# Patient Record
Sex: Female | Born: 1970
Health system: Southern US, Community
[De-identification: ages and names within clinical notes are randomized; demographics above are authoritative.]

## PROBLEM LIST (undated history)

## (undated) DIAGNOSIS — M1990 Unspecified osteoarthritis, unspecified site: Secondary | ICD-10-CM

## (undated) DIAGNOSIS — F32A Depression, unspecified: Secondary | ICD-10-CM

## (undated) DIAGNOSIS — E7800 Pure hypercholesterolemia, unspecified: Secondary | ICD-10-CM

## (undated) DIAGNOSIS — R072 Precordial pain: Secondary | ICD-10-CM

## (undated) DIAGNOSIS — K589 Irritable bowel syndrome without diarrhea: Secondary | ICD-10-CM

## (undated) DIAGNOSIS — H409 Unspecified glaucoma: Secondary | ICD-10-CM

## (undated) DIAGNOSIS — D72821 Monocytosis (symptomatic): Secondary | ICD-10-CM

## (undated) DIAGNOSIS — M549 Dorsalgia, unspecified: Secondary | ICD-10-CM

## (undated) DIAGNOSIS — F329 Major depressive disorder, single episode, unspecified: Secondary | ICD-10-CM

## (undated) DIAGNOSIS — D759 Disease of blood and blood-forming organs, unspecified: Secondary | ICD-10-CM

## (undated) DIAGNOSIS — D471 Chronic myeloproliferative disease: Secondary | ICD-10-CM

## (undated) DIAGNOSIS — E559 Vitamin D deficiency, unspecified: Secondary | ICD-10-CM

## (undated) DIAGNOSIS — L299 Pruritus, unspecified: Secondary | ICD-10-CM

## (undated) DIAGNOSIS — I70208 Unspecified atherosclerosis of native arteries of extremities, other extremity: Secondary | ICD-10-CM

## (undated) DIAGNOSIS — Z8249 Family history of ischemic heart disease and other diseases of the circulatory system: Secondary | ICD-10-CM

## (undated) DIAGNOSIS — K219 Gastro-esophageal reflux disease without esophagitis: Secondary | ICD-10-CM

## (undated) DIAGNOSIS — D72829 Elevated white blood cell count, unspecified: Principal | ICD-10-CM

## (undated) DIAGNOSIS — M2550 Pain in unspecified joint: Secondary | ICD-10-CM

## (undated) DIAGNOSIS — E079 Disorder of thyroid, unspecified: Secondary | ICD-10-CM

## (undated) DIAGNOSIS — I10 Essential (primary) hypertension: Secondary | ICD-10-CM

## (undated) DIAGNOSIS — D72828 Other elevated white blood cell count: Secondary | ICD-10-CM

## (undated) DIAGNOSIS — M797 Fibromyalgia: Secondary | ICD-10-CM

## (undated) DIAGNOSIS — R600 Localized edema: Secondary | ICD-10-CM

## (undated) HISTORY — DX: Gastro-esophageal reflux disease without esophagitis: K21.9

## (undated) HISTORY — DX: Vitamin D deficiency, unspecified: E55.9

## (undated) HISTORY — DX: Other elevated white blood cell count: D72.828

## (undated) HISTORY — DX: Chronic myeloproliferative disease: D47.1

## (undated) HISTORY — DX: Pure hypercholesterolemia, unspecified: E78.00

## (undated) HISTORY — DX: Elevated white blood cell count, unspecified: D72.829

## (undated) HISTORY — DX: Unspecified atherosclerosis of native arteries of extremities, other extremity: I70.208

## (undated) HISTORY — DX: Precordial pain: R07.2

## (undated) HISTORY — DX: Pain in unspecified joint: M25.50

## (undated) HISTORY — DX: Localized edema: R60.0

## (undated) HISTORY — DX: Dorsalgia, unspecified: M54.9

## (undated) HISTORY — DX: Monocytosis (symptomatic): D72.821

## (undated) HISTORY — DX: Family history of ischemic heart disease and other diseases of the circulatory system: Z82.49

## (undated) HISTORY — DX: Disorder of thyroid, unspecified: E07.9

## (undated) HISTORY — DX: Pruritus, unspecified: L29.9

## (undated) HISTORY — DX: Fibromyalgia: M79.7

## (undated) HISTORY — DX: Irritable bowel syndrome, unspecified: K58.9

## (undated) HISTORY — DX: Depression, unspecified: F32.A

## (undated) HISTORY — PX: EYE SURGERY: SHX253

---
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
## 1997-09-02 ENCOUNTER — Emergency Department (HOSPITAL_COMMUNITY): Admission: EM | Admit: 1997-09-02 | Discharge: 1997-09-02 | Payer: Self-pay | Admitting: Emergency Medicine

## 1997-09-04 ENCOUNTER — Ambulatory Visit (HOSPITAL_COMMUNITY): Admission: RE | Admit: 1997-09-04 | Discharge: 1997-09-04 | Payer: Self-pay | Admitting: Emergency Medicine

## 1998-03-18 ENCOUNTER — Other Ambulatory Visit: Admission: RE | Admit: 1998-03-18 | Discharge: 1998-03-18 | Payer: Self-pay | Admitting: *Deleted

## 1998-03-22 HISTORY — PX: TUBAL LIGATION: SHX77

## 1998-06-17 ENCOUNTER — Encounter: Payer: Self-pay | Admitting: *Deleted

## 1998-06-18 ENCOUNTER — Ambulatory Visit (HOSPITAL_COMMUNITY): Admission: RE | Admit: 1998-06-18 | Discharge: 1998-06-18 | Payer: Self-pay | Admitting: *Deleted

## 1998-07-24 ENCOUNTER — Encounter: Payer: Self-pay | Admitting: *Deleted

## 1998-07-24 ENCOUNTER — Ambulatory Visit (HOSPITAL_COMMUNITY): Admission: RE | Admit: 1998-07-24 | Discharge: 1998-07-24 | Payer: Self-pay | Admitting: *Deleted

## 1998-10-01 ENCOUNTER — Inpatient Hospital Stay (HOSPITAL_COMMUNITY): Admission: AD | Admit: 1998-10-01 | Discharge: 1998-10-03 | Payer: Self-pay | Admitting: *Deleted

## 2007-07-17 ENCOUNTER — Other Ambulatory Visit: Admission: RE | Admit: 2007-07-17 | Discharge: 2007-07-17 | Payer: Self-pay | Admitting: Obstetrics and Gynecology

## 2007-07-27 ENCOUNTER — Encounter: Admission: RE | Admit: 2007-07-27 | Discharge: 2007-07-27 | Payer: Self-pay | Admitting: Obstetrics and Gynecology

## 2008-08-08 ENCOUNTER — Encounter: Admission: RE | Admit: 2008-08-08 | Discharge: 2008-08-08 | Payer: Self-pay | Admitting: Physician Assistant

## 2008-08-12 ENCOUNTER — Encounter: Admission: RE | Admit: 2008-08-12 | Discharge: 2008-08-12 | Payer: Self-pay | Admitting: Family Medicine

## 2009-08-10 ENCOUNTER — Ambulatory Visit (HOSPITAL_COMMUNITY): Admission: RE | Admit: 2009-08-10 | Discharge: 2009-08-10 | Payer: Self-pay | Admitting: Psychiatry

## 2009-08-15 ENCOUNTER — Other Ambulatory Visit (HOSPITAL_COMMUNITY): Admission: RE | Admit: 2009-08-15 | Discharge: 2009-08-20 | Payer: Self-pay | Admitting: Psychiatry

## 2009-08-19 ENCOUNTER — Ambulatory Visit: Payer: Self-pay | Admitting: Psychiatry

## 2009-08-19 ENCOUNTER — Emergency Department (HOSPITAL_COMMUNITY): Admission: EM | Admit: 2009-08-19 | Discharge: 2009-08-20 | Payer: Self-pay | Admitting: Emergency Medicine

## 2009-08-23 ENCOUNTER — Ambulatory Visit (HOSPITAL_COMMUNITY): Admission: RE | Admit: 2009-08-23 | Discharge: 2009-08-23 | Payer: Self-pay | Admitting: Psychiatry

## 2009-08-25 ENCOUNTER — Ambulatory Visit (HOSPITAL_COMMUNITY): Admission: RE | Admit: 2009-08-25 | Discharge: 2009-08-25 | Payer: Self-pay | Admitting: Psychiatry

## 2009-09-04 ENCOUNTER — Encounter: Admission: RE | Admit: 2009-09-04 | Discharge: 2009-09-04 | Payer: Self-pay | Admitting: Family Medicine

## 2009-10-14 ENCOUNTER — Encounter: Admission: RE | Admit: 2009-10-14 | Discharge: 2009-10-14 | Payer: Self-pay | Admitting: Family Medicine

## 2009-12-04 ENCOUNTER — Ambulatory Visit (HOSPITAL_COMMUNITY): Admission: RE | Admit: 2009-12-04 | Discharge: 2009-12-04 | Payer: Self-pay | Admitting: Cardiology

## 2009-12-16 ENCOUNTER — Other Ambulatory Visit: Admission: RE | Admit: 2009-12-16 | Discharge: 2009-12-16 | Payer: Self-pay | Admitting: Obstetrics and Gynecology

## 2010-02-26 ENCOUNTER — Inpatient Hospital Stay (HOSPITAL_COMMUNITY): Admission: AD | Admit: 2010-02-26 | Discharge: 2009-08-26 | Payer: Self-pay | Admitting: Psychiatry

## 2010-04-12 ENCOUNTER — Encounter (HOSPITAL_COMMUNITY): Payer: Self-pay | Admitting: Psychiatry

## 2010-04-13 ENCOUNTER — Encounter: Payer: Self-pay | Admitting: Family Medicine

## 2010-05-29 ENCOUNTER — Emergency Department (HOSPITAL_COMMUNITY)
Admission: EM | Admit: 2010-05-29 | Discharge: 2010-05-30 | Disposition: A | Discharge status: Other Acute Inpatient Hospital | Payer: Self-pay | Attending: Emergency Medicine | Admitting: Emergency Medicine

## 2010-05-29 DIAGNOSIS — F411 Generalized anxiety disorder: Secondary | ICD-10-CM | POA: Insufficient documentation

## 2010-05-29 DIAGNOSIS — F3289 Other specified depressive episodes: Secondary | ICD-10-CM | POA: Insufficient documentation

## 2010-05-29 DIAGNOSIS — F329 Major depressive disorder, single episode, unspecified: Secondary | ICD-10-CM | POA: Insufficient documentation

## 2010-05-29 LAB — COMPREHENSIVE METABOLIC PANEL
ALT: <8 U/L (ref 0–35)
Alkaline Phosphatase: 92 U/L (ref 39–117)
BUN: 8 mg/dL (ref 6–23)
CO2: 26 mEq/L (ref 19–32)
GFR calc non Af Amer: >60 mL/min (ref >60)
Glucose, Bld: 100 mg/dL — ABNORMAL HIGH (ref 70–99)
Potassium: 3.4 mEq/L — ABNORMAL LOW (ref 3.5–5.1)
Sodium: 138 mEq/L (ref 135–145)
Total Bilirubin: 0.7 mg/dL (ref 0.3–1.2)

## 2010-05-29 LAB — ETHANOL: Alcohol, Ethyl (B): 7 mg/dL (ref 0–10)

## 2010-05-29 LAB — CBC
HCT: 37.2 % (ref 36.0–46.0)
MCH: 28.2 pg (ref 26.0–34.0)
MCV: 90.3 fL (ref 78.0–100.0)
Platelets: 355 10*3/uL (ref 150–400)
RBC: 4.12 MIL/uL (ref 3.87–5.11)
RDW: 12.9 % (ref 11.5–15.5)
WBC: 15.6 10*3/uL — ABNORMAL HIGH (ref 4.0–10.5)

## 2010-05-29 LAB — RAPID URINE DRUG SCREEN, HOSP PERFORMED
Cocaine: NOT DETECTED
Opiates: NOT DETECTED
Tetrahydrocannabinol: NOT DETECTED

## 2010-05-29 LAB — DIFFERENTIAL
Eosinophils Absolute: 0.1 10*3/uL (ref 0.0–0.7)
Eosinophils Relative: 1 % (ref 0–5)
Lymphocytes Relative: 18 % (ref 12–46)
Lymphs Abs: 2.8 10*3/uL (ref 0.7–4.0)
Monocytes Relative: 6 % (ref 3–12)

## 2010-05-29 LAB — URINALYSIS, ROUTINE W REFLEX MICROSCOPIC
Bilirubin Urine: NEGATIVE
Ketones, ur: NEGATIVE mg/dL
Nitrite: NEGATIVE
Protein, ur: NEGATIVE mg/dL
Urobilinogen, UA: 0.2 mg/dL (ref 0.0–1.0)

## 2010-05-29 LAB — URINE MICROSCOPIC-ADD ON

## 2010-05-30 ENCOUNTER — Inpatient Hospital Stay (HOSPITAL_COMMUNITY)
Admission: EM | Admit: 2010-05-30 | Discharge: 2010-06-09 | DRG: 885 | Disposition: A | Discharge status: Home / Self Care | Payer: Medicaid Other | Source: Other Acute Inpatient Hospital | Attending: Psychiatry | Admitting: Psychiatry

## 2010-05-30 DIAGNOSIS — F331 Major depressive disorder, recurrent, moderate: Secondary | ICD-10-CM

## 2010-05-30 DIAGNOSIS — Z560 Unemployment, unspecified: Secondary | ICD-10-CM

## 2010-05-30 DIAGNOSIS — F411 Generalized anxiety disorder: Secondary | ICD-10-CM

## 2010-05-30 DIAGNOSIS — F332 Major depressive disorder, recurrent severe without psychotic features: Principal | ICD-10-CM

## 2010-05-30 DIAGNOSIS — F607 Dependent personality disorder: Secondary | ICD-10-CM

## 2010-05-30 DIAGNOSIS — R45851 Suicidal ideations: Secondary | ICD-10-CM

## 2010-05-30 DIAGNOSIS — Z91199 Patient's noncompliance with other medical treatment and regimen due to unspecified reason: Secondary | ICD-10-CM

## 2010-05-30 DIAGNOSIS — Z9119 Patient's noncompliance with other medical treatment and regimen: Secondary | ICD-10-CM

## 2010-05-30 DIAGNOSIS — E669 Obesity, unspecified: Secondary | ICD-10-CM

## 2010-05-30 DIAGNOSIS — E785 Hyperlipidemia, unspecified: Secondary | ICD-10-CM

## 2010-05-30 DIAGNOSIS — E7800 Pure hypercholesterolemia, unspecified: Secondary | ICD-10-CM

## 2010-05-30 DIAGNOSIS — Z818 Family history of other mental and behavioral disorders: Secondary | ICD-10-CM

## 2010-05-30 DIAGNOSIS — F39 Unspecified mood [affective] disorder: Secondary | ICD-10-CM

## 2010-05-31 LAB — URINE CULTURE
Colony Count: >=100000
Culture  Setup Time: 201203100143

## 2010-06-02 LAB — URINALYSIS, ROUTINE W REFLEX MICROSCOPIC
Ketones, ur: NEGATIVE mg/dL
Nitrite: NEGATIVE
pH: 6.5 (ref 5.0–8.0)

## 2010-06-02 LAB — URINE MICROSCOPIC-ADD ON

## 2010-06-02 LAB — ANA: Anti Nuclear Antibody(ANA): NEGATIVE

## 2010-06-02 LAB — VITAMIN B12: Vitamin B-12: 648 pg/mL (ref 211–911)

## 2010-06-03 LAB — ESTROGENS, TOTAL: Estrogen: 131.2 pg/mL

## 2010-06-07 NOTE — H&P (Signed)
Brianna Fletcher NO.:  [id number]  MEDICAL RECORD NO.:  [id number]           PATIENT TYPE:  I  LOCATION:  0503                          FACILITY:  [facility]  PHYSICIAN:  Brianna Edelson, DO        DATE OF BIRTH:  02/03/1971  DATE OF ADMISSION:  05/30/2010 DATE OF DISCHARGE:                      PSYCHIATRIC ADMISSION ASSESSMENT   This is a voluntary admission to the services of Dr. Marlis Fletcher.  This is a 40 year old divorced white female.  She presented to [name]. She reported that she suffers from depression and she had gotten "real bad again."  She saw her nurse practitioner earlier in the week and they had increased her meds, but she stated she does not believe the meds are working for her.  She reported feeling suicidal, although she denied a plan.  She has been having severe migraines and believes this is part of what is setting her off, as well as severe anxiety.  Today, she states that after she left here last January, she did well on Risperdal and Celexa for a while.  Something happened and it required a med change. She was put on Geodon 80 mg a day and apparently she did very well on this.  At the end of December, Brianna Fletcher told her she could no longer provide her with samples and she was switched to Fanapt.  She states that coming off of the Geodon was very, very difficult for her and if she would ever miss a dose of the Geodon she would have a lot of symptoms.  Today, she states that her mind is racing a lot.  She does not want to get out of bed, especially for the past 3 weeks or so, but makes herself get up and help with her 72 year old son and she is back to having uncontrolled crying spells and mood swings.  PAST PSYCHIATRIC HISTORY:  When she was with [place] in June 2011, she reported that she had taken an antidepressant for a month or less, maybe five times in the past 10-15 years.  Last year's episode was unique, it was completely different  from her prior experiences and that was her first inpatient hospitalization.  She was with [place] from August 20, 2009 to August 26, 2009.  SOCIAL HISTORY:  She has a GED.  She is divorced.  She has two sons; her older son age 46 lives in [place], he is on his own and the 38 year old son lives with her.  She has been able to get into her own house. After she was with [place] in June, she was employed until August and she currently receives unemployment.  FAMILY HISTORY:  Her mother does have seasonal affective depression. She takes Zoloft.  ALLERGIES:  SHE HAS NO KNOWN DRUG ALLERGIES.  ALCOHOL AND DRUG HISTORY:  Negative.  PRIMARY CARE PROVIDER:  [name], the PA at [facility] and she is currently at [facility], where she sees the nurse practitioner, Brianna Fletcher.  Her therapist quit and unfortunately she has not returned to therapy.  MEDICAL PROBLEMS:  She has high cholesterol.  She  is somewhat obese.  PHYSICAL EXAMINATION:  VITAL SIGNS:  She was afebrile 97.4 to 98.3, pulse was 68 to 80, respirations 18 to 20, blood pressure was 110/69 to 131/79.  Urine drug screen was negative.  Her WBC was a little bit elevated at 15.6.  Her potassium was slightly low at 3.4 and her glucose was 100. She did have a small amount of leukocyte esterase in her urine, although she has no complaints.  We will repeat UA to see if she needs treatment.  MENTAL STATUS EXAM:  She is casually groomed and dressed in hospital scrubs.  She is quite labile, crying frequently and freely.  She reports that she is having racing thoughts.  Her mood and affect are mixed.  She can hold it together one second and the next second she is crying.  Her thought processes are somewhat clear, rational and goal oriented.  She states that she wants to get well.  She wants to be able to go home and take care of her son, although she is fearful that this will never happen.  Judgment and insight are fair.   Concentration and memory are intact.  Intelligence is average.  She is not actively suicidal.  She just does not want to live like this.  She has never been homicidal nor does she have auditory or visual hallucinations.  She was seen in the emergency room by Dr. Rogers Blocker, who remembered her and he will start her back on Risperdal and Celexa, which is what she was stabilized on last June.  Also, they will continue her BuSpar and estimated length of stay is 3-5 days.     Brianna Fletcher, P.A.-C.   ______________________________ Brianna Edelson, DO    MD/MEDQ  D:  05/30/2010  T:  05/30/2010  Job:  [id number]  Electronically Signed by Jaci Lazier ADAMS P.A.-C. on 06/07/2010 12:51:05 PM Electronically Signed by Brianna Edelson MD on 06/07/2010 10:13:05 PM

## 2010-06-08 LAB — RAPID URINE DRUG SCREEN, HOSP PERFORMED
Amphetamines: NOT DETECTED
Benzodiazepines: POSITIVE — AB
Cocaine: NOT DETECTED

## 2010-06-08 LAB — CBC
HCT: 35.4 % — ABNORMAL LOW (ref 36.0–46.0)
HCT: 39.3 % (ref 36.0–46.0)
Hemoglobin: 10.9 g/dL — ABNORMAL LOW (ref 12.0–15.0)
Hemoglobin: 13.2 g/dL (ref 12.0–15.0)
MCHC: 30.8 g/dL (ref 30.0–36.0)
MCV: 91.2 fL (ref 78.0–100.0)
RDW: 12.8 % (ref 11.5–15.5)
RDW: 13.1 % (ref 11.5–15.5)
WBC: 14.4 10*3/uL — ABNORMAL HIGH (ref 4.0–10.5)

## 2010-06-08 LAB — DIFFERENTIAL
Basophils Absolute: 0.1 10*3/uL (ref 0.0–0.1)
Basophils Relative: 1 % (ref 0–1)
Eosinophils Absolute: 0 10*3/uL (ref 0.0–0.7)
Eosinophils Relative: 0 % (ref 0–5)
Lymphocytes Relative: 14 % (ref 12–46)
Monocytes Absolute: 0.6 10*3/uL (ref 0.1–1.0)

## 2010-06-08 LAB — URINALYSIS, ROUTINE W REFLEX MICROSCOPIC
Bilirubin Urine: NEGATIVE
Ketones, ur: NEGATIVE mg/dL
Nitrite: NEGATIVE
Protein, ur: NEGATIVE mg/dL
Urobilinogen, UA: 0.2 mg/dL (ref 0.0–1.0)

## 2010-06-08 LAB — URINE CULTURE

## 2010-06-08 LAB — ETHANOL: Alcohol, Ethyl (B): <5 mg/dL (ref 0–10)

## 2010-06-08 LAB — COMPREHENSIVE METABOLIC PANEL
Alkaline Phosphatase: 84 U/L (ref 39–117)
BUN: 9 mg/dL (ref 6–23)
Chloride: 103 mEq/L (ref 96–112)
Glucose, Bld: 90 mg/dL (ref 70–99)
Potassium: 3.8 mEq/L (ref 3.5–5.1)
Total Bilirubin: 0.2 mg/dL — ABNORMAL LOW (ref 0.3–1.2)

## 2010-06-09 LAB — URINALYSIS, ROUTINE W REFLEX MICROSCOPIC
Glucose, UA: NEGATIVE mg/dL
Hgb urine dipstick: NEGATIVE
Protein, ur: NEGATIVE mg/dL
pH: 5.5 (ref 5.0–8.0)

## 2010-06-09 LAB — DIFFERENTIAL
Lymphocytes Relative: 24 % (ref 12–46)
Lymphs Abs: 3.1 10*3/uL (ref 0.7–4.0)
Monocytes Relative: 8 % (ref 3–12)
Neutrophils Relative %: 66 % (ref 43–77)

## 2010-06-09 LAB — CBC
HCT: 37.6 % (ref 36.0–46.0)
Hemoglobin: 11.6 g/dL — ABNORMAL LOW (ref 12.0–15.0)
MCH: 28 pg (ref 26.0–34.0)
MCV: 90.8 fL (ref 78.0–100.0)
RBC: 4.14 MIL/uL (ref 3.87–5.11)
WBC: 13 10*3/uL — ABNORMAL HIGH (ref 4.0–10.5)

## 2010-06-11 NOTE — Discharge Summary (Signed)
Brianna Fletcher, Brianna Fletcher NO.:  [id number]  MEDICAL RECORD NO.:  [id number]           PATIENT TYPE:  I  LOCATION:  0503                          FACILITY:  [facility]  PHYSICIAN:  Marlis Edelson, DO        DATE OF BIRTH:  [birth year]  DATE OF ADMISSION:  05/30/2010 DATE OF DISCHARGE:  06/09/2010                              DISCHARGE SUMMARY   REASON FOR ADMISSION:  This is a 40 year old female presenting with depression that had worsened.  Her medications were not working for her feeling suicidal without any specific plan.  Had some recent med changes and has been experiencing uncontrolled crying and mood swings.  FINAL DIAGNOSIS:  AXIS I:  Major depressive disorder, general anxiety disorder. AXIS II: Dependent personality disorder. AXIS III: Hyperlipidemia. AXIS IV: Burden of illness. AXIS V: 55-60.  SIGNIFICANT LABORATORIES:  Urine drug screen was negative.  CMP essentially within normal limits.  TSH is 1.600.  ANA was negative. Estrogen level at 131.2, vitamin D 55.  Serum iron was at 50. Urinalysis was negative.  CBC essentially within normal limits with a white count at 13.  SIGNIFICANT FINDINGS:  The patient presented initially casually groomed, very labile, crying frequently and freely, having racing thoughts.  Mood and affect were mixed.  Her judgment and insight were fair. Concentration and memory were intact and average intelligence, was not actively suicidal, but stating that she does not want to live like this anymore.  We integrated patient to the adult milieu in the mood disorder group.  The patient was participating in groups.  She was having no significant problems with insomnia with marked morning fatigue with decreased motivation, wanting to stay in bed.  We added Klonopin to help with her anxiety.  Discontinued her Celexa and initiated Zoloft daily. Increased Risperdal for mood stability, added vitamin D and we are decreasing her BuSpar with  anticipation to discontinuing the medications.  The patient was having some suicidal thoughts that come and go and was still experiencing poor sleep.  She was having no issues with her medications.  No side effects.  We contacted the patient's mother for safety concerns and to provide education.  She was beginning to improve.  We discontinued her BuSpar.  Still having some issues with worry. The patient reported that she had done well on Geodon in the past, and her suicidal thoughts were resolving.  She felt her mind was calming down, but still not feeling happy.  We initiated 60 mg of Geodon, discussing risk and benefits.  Her tearful episodes were decreasing. She was tolerating her medications.  She continued to be actively involved in groups.  She was beginning to see some improvement in herself, interacting with other patients.  Mood was improving.  Her depression she rated at a 4.  Her anxiety was less.  Still continuing with some early morning fatigue, but she was beginning to have a sense humor, was laughing and smiling and interacting appropriately.  On day of discharge, the patient was looking much better and feeling much better, was having some futuristic goals and plans for herself.  Her tearfulness is gone.  Adequate safety plan for herself.  She was tolerating her prescriptions without any side effects.  She had good eye contact, was neat in appearance.  We felt the patient was stable for discharge.  She had plans to go shopping, to pamper herself with friends, and already had plans to go to church on Sunday with her friends.  DISCHARGE MEDICATIONS: 1. Klonopin 1 mg t.i.d. 2. Zoloft 100 mg daily. 3. Geodon 80 mg one q.h.s. 4. Multivitamin daily. 5. Omeprazole 40 mg daily. 6. Simvastatin 40 mg daily. 7. Vitamin D3 four tablets daily. 8. The patient was to stop her Celexa, BuSpar and Fanapt.  FOLLOWUP:  Her follow-up appointment was at [facility] on Friday March  30.  The patient was advised to go to the emergency room for any suicidal or homicidal thinking or psychotic symptoms.     Landry Corporal, N.P.   ______________________________ Marlis Edelson, DO    JO/MEDQ  D:  06/09/2010  T:  06/09/2010  Job:  [id number]  Electronically Signed by Limmie PatriciaP. on 06/11/2010 09:16:02 AM Electronically Signed by Marlis Edelson MD on 06/11/2010 07:58:28 PM

## 2012-08-22 ENCOUNTER — Other Ambulatory Visit: Payer: Self-pay | Admitting: Family Medicine

## 2012-08-22 ENCOUNTER — Ambulatory Visit
Admission: RE | Admit: 2012-08-22 | Discharge: 2012-08-22 | Disposition: A | Discharge status: Home / Self Care | Payer: BC Managed Care – PPO | Source: Ambulatory Visit | Attending: Family Medicine | Admitting: Family Medicine

## 2012-08-22 DIAGNOSIS — R05 Cough: Secondary | ICD-10-CM

## 2012-08-22 DIAGNOSIS — R059 Cough, unspecified: Secondary | ICD-10-CM

## 2012-11-10 ENCOUNTER — Telehealth: Payer: Self-pay | Admitting: Hematology & Oncology

## 2012-11-10 NOTE — Telephone Encounter (Signed)
Pt aware of 11-15-12 appointment. Per her request I left detailed message on vm. Left message on referral line at referring with pt's appointment

## 2012-11-15 ENCOUNTER — Ambulatory Visit (HOSPITAL_BASED_OUTPATIENT_CLINIC_OR_DEPARTMENT_OTHER)
Admission: RE | Admit: 2012-11-15 | Discharge: 2012-11-15 | Disposition: A | Discharge status: Home / Self Care | Payer: BC Managed Care – PPO | Source: Ambulatory Visit | Attending: Hematology & Oncology | Admitting: Hematology & Oncology

## 2012-11-15 ENCOUNTER — Ambulatory Visit: Payer: BC Managed Care – PPO

## 2012-11-15 ENCOUNTER — Other Ambulatory Visit (HOSPITAL_BASED_OUTPATIENT_CLINIC_OR_DEPARTMENT_OTHER): Payer: BC Managed Care – PPO | Admitting: Lab

## 2012-11-15 ENCOUNTER — Ambulatory Visit (HOSPITAL_BASED_OUTPATIENT_CLINIC_OR_DEPARTMENT_OTHER): Payer: BC Managed Care – PPO | Admitting: Hematology & Oncology

## 2012-11-15 VITALS — BP 144/65 | HR 66 | Temp 98.1°F | Resp 16 | Ht 65.0 in | Wt 242.0 lb

## 2012-11-15 DIAGNOSIS — D72829 Elevated white blood cell count, unspecified: Secondary | ICD-10-CM | POA: Insufficient documentation

## 2012-11-15 DIAGNOSIS — L299 Pruritus, unspecified: Secondary | ICD-10-CM

## 2012-11-15 DIAGNOSIS — D72828 Other elevated white blood cell count: Secondary | ICD-10-CM | POA: Insufficient documentation

## 2012-11-15 DIAGNOSIS — K7689 Other specified diseases of liver: Secondary | ICD-10-CM | POA: Insufficient documentation

## 2012-11-15 DIAGNOSIS — N83209 Unspecified ovarian cyst, unspecified side: Secondary | ICD-10-CM | POA: Insufficient documentation

## 2012-11-15 DIAGNOSIS — D72821 Monocytosis (symptomatic): Secondary | ICD-10-CM | POA: Insufficient documentation

## 2012-11-15 DIAGNOSIS — Q619 Cystic kidney disease, unspecified: Secondary | ICD-10-CM | POA: Insufficient documentation

## 2012-11-15 DIAGNOSIS — R634 Abnormal weight loss: Secondary | ICD-10-CM | POA: Insufficient documentation

## 2012-11-15 HISTORY — DX: Other elevated white blood cell count: D72.828

## 2012-11-15 HISTORY — DX: Elevated white blood cell count, unspecified: D72.829

## 2012-11-15 HISTORY — DX: Monocytosis (symptomatic): D72.821

## 2012-11-15 HISTORY — DX: Pruritus, unspecified: L29.9

## 2012-11-15 LAB — CBC WITH DIFFERENTIAL (CANCER CENTER ONLY)
Eosinophils Absolute: 0.2 10*3/uL (ref 0.0–0.5)
HCT: 39.9 % (ref 34.8–46.6)
HGB: 12.9 g/dL (ref 11.6–15.9)
MCH: 30.5 pg (ref 26.0–34.0)
MCHC: 32.3 g/dL (ref 32.0–36.0)
MCV: 94 fL (ref 81–101)
MONO#: 1 10*3/uL — ABNORMAL HIGH (ref 0.1–0.9)
NEUT#: 18.4 10*3/uL — ABNORMAL HIGH (ref 1.5–6.5)
Platelets: 307 10*3/uL (ref 145–400)

## 2012-11-15 LAB — CHCC SATELLITE - SMEAR

## 2012-11-15 LAB — LACTATE DEHYDROGENASE: LDH: 135 U/L (ref 94–250)

## 2012-11-15 MED ORDER — IOHEXOL 300 MG/ML  SOLN
100.0000 mL | Freq: Once | INTRAMUSCULAR | Status: AC | PRN
  Administered 2012-11-15: 100 mL via INTRAVENOUS

## 2012-11-15 NOTE — Progress Notes (Signed)
This office note has been dictated.

## 2012-11-16 ENCOUNTER — Ambulatory Visit (HOSPITAL_BASED_OUTPATIENT_CLINIC_OR_DEPARTMENT_OTHER): Payer: BC Managed Care – PPO

## 2012-11-16 NOTE — Progress Notes (Signed)
CC:   Dario Guardian, M.D.  DIAGNOSES: 1. Leukocytosis. 2. Chronic pruritus.  HISTORY OF PRESENT ILLNESS:  Brianna Fletcher is a very nice 42 year old white female.  She has been pretty healthy her whole life.  She started to develop some pruritus a year ago.  She was seen, I think, by some dermatologist.  She has been given medicine to try to help with the pruritus.  Nothing has really been helping her.  She is followed by Dr. Merri Brunette of [facility].  She has had lab work done.  Back in late July, a CBC was done which showed a white cell count of 24, hemoglobin 12.8, hematocrit 38.4, platelet count 349.  White cell differential showed 82 segs, 14 lymphocytes, 2 monos, 1 eosinophil.  Going back to September 2011, white cell count was 12.1. She has a normal retic count.  LDH is 150. She has had, I think, normal electrolytes.  Her liver tests were okay. She had normal renal function.  There are no problems with uremia.  She has never had any kind of rashes.  She has never had any kind of weight loss or weight gain.  There has been no fever, sweats or chills. There has been no change in bowel or bladder habits.  She is negative for hepatitis B and C.  She, again, has not responded to antipruritics.  She has no obvious occupational exposures.  She has had no recent travel.  There is nothing unusual for diet.  She is not vegetarian.  She has had a little bit of a cough.  She says she had a chest x-ray 2 months ago which was negative.  She has not been on any kind of steroids.  We were asked to see her because of the leukocytosis.  Again, she has been seen by Dermatology.  From what Brianna Fletcher says they have not done any kind of biopsies.  They felt that hematologic evaluation was necessary.  PAST MEDICAL HISTORY: 1. Hypertension. 2. Hyperlipidemia. 3. Intermittent depression-situational.  ALLERGIES:  Flu vaccine.  MEDICATIONS:  Atarax 25 mg q.6 hours  p.r.n.  SOCIAL HISTORY:  Negative for tobacco use.  There was no significant alcohol use.  She has no obvious occupational exposures.  There is no recreational drug use.  FAMILY HISTORY:  Noncontributory.  REVIEW OF SYSTEMS:  As stated in the history of present illness.  No additional findings noted on a 12-system review. Of note, she was on a Medrol Dosepak back in June.  PHYSICAL EXAMINATION:  General:  This is a well-developed, well- nourished white female in no obvious distress.  Vital signs: Temperature of 98.1, pulse 66, respiratory rate 16, blood pressure 144/65.  Weight is 242.  Head and neck:  Normocephalic, atraumatic skull.  There are no ocular or oral lesions.  There are no palpable cervical or supraclavicular lymph nodes.  Lungs:  Clear bilaterally. Cardiac:  Regular rate and rhythm with a normal S1 and S2.  There are no murmurs, rubs or bruits.  Abdomen:  Soft.  She has good bowel sounds. There is no fluid wave.  There is no guarding or rebound tenderness. There is no palpable hepatomegaly.  I cannot palpate a spleen tip with inspiration.  Back:  No tenderness over the spine, ribs, or hips. Extremities:  Show no clubbing, cyanosis or edema.  Neurological:  Shows no focal neurological deficits.  Skin:  Shows no rashes, ecchymoses or petechia.  LABORATORY STUDIES:  White cell count is 22.5,  hemoglobin 13, hematocrit 40, platelet count 307.  MCV is 94.  White cell differential shows 83 segs, 13 lymphocytes, 5 monos.  Peripheral smear shows a normochromic, normocytic population of red blood cells.  There are no nucleated red blood cells.  There is no rouleaux formation.  I see no teardrop cells.  She has no target cells, or schistocytes.  There are no spherocytes.  White cells are increased in number.  She has immature myeloid cells.  There are no hypersegmented polys.  There may be a rare myelocyte or metamyelocyte.  I see a couple of bands.  There are no blasts.  I  see no atypical lymphocytes. Platelets are adequate in number and size.  CT scan of the chest, abdomen and pelvis did not show any lymphadenopathy.  She did have a minimally enlarged spleen.  Liver looked okay.  There are no pulmonary nodules.  There are no bony lesions.  IMPRESSION:  Brianna Fletcher is a very charming 42 year old white female with leukocytosis.  She has a clear predominance of neutrophils.  Her blood smear certainly is not that striking for a myeloproliferative process.  However, I think we will need to check her for BCR/ABL and JAK2.  The fact that her spleen is mildly enlarged could point to her possibly having a myeloproliferative disorder.  It is possible that we may have to consider a bone marrow biopsy on her.  With the mildly enlarged spleen, she could also potentially have some type of splenic lymphoma.  It is hard to say if the itching is related to her leukocytosis.  I do not see any obvious infections.  I do not see any obvious malignancy that would trigger a leukemoid reaction. She has no occupational exposures.  There is nothing at home that would trigger this pruritus.  I told her to try Pepcid over-the-counter.  I told her to try 40 mg twice a day of over-the-counter Pepcid to see if this would help.  Again, we are going to see what the BCR/ABL analysis is.  We will see what the JAK2 assay shows [place].  I also sent off an LDH.  Ultimately, a bone marrow biopsy might be indicated.  I just cannot think of anything right now that we can do for her to try to help with this itching.  I spent about an hour or so with Brianna Fletcher and her friend.  They are both very nice.  I answered all their questions.    ______________________________ Josph Macho, M.D. PRE/MEDQ  D:  11/15/2012  T:  11/16/2012  Job:  [id number]

## 2012-11-17 ENCOUNTER — Other Ambulatory Visit (HOSPITAL_BASED_OUTPATIENT_CLINIC_OR_DEPARTMENT_OTHER): Payer: BC Managed Care – PPO

## 2012-11-22 ENCOUNTER — Other Ambulatory Visit: Payer: Self-pay | Admitting: Hematology & Oncology

## 2012-11-22 ENCOUNTER — Telehealth: Payer: Self-pay | Admitting: Hematology & Oncology

## 2012-11-22 DIAGNOSIS — D72829 Elevated white blood cell count, unspecified: Secondary | ICD-10-CM

## 2012-11-22 NOTE — Progress Notes (Signed)
I LEFT A MESSAGE ON HER PHONE THAT HER  BCR/ABL AND JAK2 STUDIES WERE NORMAL.  I DO NOT SEE ANY OBVIOUS MARROW OR BLOOD DISORDER YET.  HOWEVER, THE ULTIMATE TEST WILL BE A MARROW BX AND I WILL SET THIS UP TO BE DONE BY RADIOLOGY - [facility] NEXT WEEK.  I STILL AM NOT SURE WHY SHE IS ITCHING. IT COULD BE ENVIRONMENTAL AND CAUSING A LEUKEMOID REACTION.  I TOLD HER TO CALL ME IF ANY QUESTIONS.  PETE

## 2012-11-22 NOTE — Telephone Encounter (Signed)
Pt called wanting results transferred to RN Amy

## 2012-11-23 ENCOUNTER — Telehealth: Payer: Self-pay | Admitting: Hematology & Oncology

## 2012-11-23 NOTE — Telephone Encounter (Signed)
Alecia aware of BMBX order will set up and call pt.

## 2012-11-28 ENCOUNTER — Other Ambulatory Visit: Payer: Self-pay | Admitting: Radiology

## 2012-11-29 ENCOUNTER — Ambulatory Visit (HOSPITAL_COMMUNITY)
Admission: RE | Admit: 2012-11-29 | Discharge: 2012-11-29 | Disposition: A | Discharge status: Home / Self Care | Payer: BC Managed Care – PPO | Source: Ambulatory Visit | Attending: Hematology & Oncology | Admitting: Hematology & Oncology

## 2012-11-29 ENCOUNTER — Encounter (HOSPITAL_COMMUNITY): Payer: Self-pay

## 2012-11-29 VITALS — BP 128/84 | HR 52 | Temp 97.6°F | Resp 16 | Ht 65.0 in | Wt 242.0 lb

## 2012-11-29 DIAGNOSIS — R7989 Other specified abnormal findings of blood chemistry: Secondary | ICD-10-CM | POA: Insufficient documentation

## 2012-11-29 DIAGNOSIS — D72829 Elevated white blood cell count, unspecified: Secondary | ICD-10-CM

## 2012-11-29 DIAGNOSIS — L299 Pruritus, unspecified: Secondary | ICD-10-CM | POA: Insufficient documentation

## 2012-11-29 HISTORY — DX: Unspecified osteoarthritis, unspecified site: M19.90

## 2012-11-29 HISTORY — DX: Unspecified glaucoma: H40.9

## 2012-11-29 LAB — CBC
HCT: 36.8 % (ref 36.0–46.0)
RDW: 13 % (ref 11.5–15.5)
WBC: 21.6 10*3/uL — ABNORMAL HIGH (ref 4.0–10.5)

## 2012-11-29 LAB — PROTIME-INR
INR: 0.96 (ref 0.00–1.49)
Prothrombin Time: 12.6 seconds (ref 11.6–15.2)

## 2012-11-29 LAB — APTT: aPTT: 30 seconds (ref 24–37)

## 2012-11-29 LAB — HCG, SERUM, QUALITATIVE: Preg, Serum: NEGATIVE

## 2012-11-29 MED ORDER — FENTANYL CITRATE 0.05 MG/ML IJ SOLN
INTRAMUSCULAR | Status: AC
  Filled 2012-11-29: qty 6

## 2012-11-29 MED ORDER — MIDAZOLAM HCL 2 MG/2ML IJ SOLN
INTRAMUSCULAR | Status: AC | PRN
  Administered 2012-11-29 (×4): 1 mg via INTRAVENOUS

## 2012-11-29 MED ORDER — MIDAZOLAM HCL 2 MG/2ML IJ SOLN
INTRAMUSCULAR | Status: AC
  Filled 2012-11-29: qty 6

## 2012-11-29 MED ORDER — HYDROCODONE-ACETAMINOPHEN 5-325 MG PO TABS
1.0000 | ORAL_TABLET | ORAL | Status: DC | PRN
  Filled 2012-11-29: qty 2

## 2012-11-29 MED ORDER — SODIUM CHLORIDE 0.9 % IV SOLN
INTRAVENOUS | Status: DC
  Administered 2012-11-29: 20 mL/h via INTRAVENOUS

## 2012-11-29 MED ORDER — FENTANYL CITRATE 0.05 MG/ML IJ SOLN
INTRAMUSCULAR | Status: AC | PRN
  Administered 2012-11-29 (×2): 50 ug via INTRAVENOUS
  Administered 2012-11-29: 100 ug via INTRAVENOUS

## 2012-11-29 NOTE — Procedures (Signed)
CT guided bone marrow aspirates and core biopsies.  No immediate complication.

## 2012-11-29 NOTE — H&P (Signed)
Chief Complaint: "I am here for a biopsy." Referring Physician: Dr. Myna Hidalgo HPI: Brianna Fletcher is an 42 y.o. female who presents today for a CT guided bone marrow biopsy. She c/o chronic whole body itching x 1 year and has seen her PCP, a dermatologist and Dr. Myna Hidalgo most recently. She states all previous testing were unremarkable other than her elevation of WBC's and steroid medications have not helped. She denies any weight changes or bowel changes. She denies any palpable lymph nodes, fever, night sweats, or chills. She denies any blood in her stool or urine. She does have distant family history of cancer and her father's medical history may involve cancer, but type is unknown. She states she did have a colonoscopy she thinks 2001 which they removed a polyp and was told to come back in 3 years, however she has not been able to schedule this appointment. She denies any chest pain or shortness of breath.  Past Medical History:  Past Medical History  Diagnosis Date  . Glaucoma   . Arthritis     Past Surgical History:  Past Surgical History  Procedure Laterality Date  . Tubal ligation  2000  . Eye surgery      laser eye surgery    Family History: No family history on file.  Social History:  reports that she has quit smoking. Her smoking use included Cigarettes. She smoked 0.00 packs per day. She does not have any smokeless tobacco history on file. She reports that she does not drink alcohol. Her drug history is not on file.  Allergies: No Known Allergies    Medication List    ASK your doctor about these medications       ranitidine 150 MG tablet  Commonly known as:  ZANTAC  Take 150 mg by mouth 2 (two) times daily.        Please HPI for pertinent positives, otherwise complete 10 system ROS negative.  Physical Exam: BP 134/65  Pulse 64  Temp(Src) 97.7 F (36.5 C) (Oral)  Resp 20  Ht 5\' 5"  (1.651 m)  Wt 242 lb (109.77 kg)  BMI 40.27 kg/m2  SpO2 99%  LMP 11/14/2012  Body mass index is 40.27 kg/(m^2).   General Appearance:  Alert, cooperative, no distress, appears stated age  Head:  Normocephalic, without obvious abnormality, atraumatic  Lungs:   Clear to auscultation bilaterally, no w/r/r, respirations unlabored without use of accessory muscles.  Chest Wall:  No tenderness or deformity  Heart:  Regular rate and rhythm, S1, S2 normal, no murmur, rub or gallop.  Abdomen:   Soft, non-tender, non distended.  Extremities: Extremities normal, atraumatic, no cyanosis or edema  Pulses: 2+ and symmetric  Neurologic: Normal affect, no gross deficits.   Results for orders placed during the hospital encounter of 11/29/12 (from the past 48 hour(s))  APTT     Status: None   Collection Time    11/29/12  7:02 AM      Result Value Range   aPTT 30  24 - 37 seconds  CBC     Status: Abnormal   Collection Time    11/29/12  7:02 AM      Result Value Range   WBC 21.6 (*) 4.0 - 10.5 K/uL   RBC 4.06  3.87 - 5.11 MIL/uL   Hemoglobin 12.2  12.0 - 15.0 g/dL   HCT 16.1  09.6 - 04.5 %   MCV 90.6  78.0 - 100.0 fL   MCH 30.0  26.0 -  34.0 pg   MCHC 33.2  30.0 - 36.0 g/dL   RDW 11.9  14.7 - 82.9 %   Platelets 304  150 - 400 K/uL  PROTIME-INR     Status: None   Collection Time    11/29/12  7:02 AM      Result Value Range   Prothrombin Time 12.6  11.6 - 15.2 seconds   INR 0.96  0.00 - 1.49   No results found.  Assessment/Plan Leukocytosis. Chronic pruritis. Request for CT guided bone marrow biopsy per Dr. Myna Hidalgo. Labs reviewed, patient has been NPO. Risks and Benefits discussed with the patient. All of the patient's questions were answered, patient is agreeable to proceed. Consent signed and in chart.   Pattricia Boss D PA-C 11/29/2012, 8:23 AM

## 2012-12-06 ENCOUNTER — Other Ambulatory Visit: Payer: Self-pay | Admitting: Hematology & Oncology

## 2012-12-06 DIAGNOSIS — L299 Pruritus, unspecified: Secondary | ICD-10-CM

## 2012-12-06 MED ORDER — DOXEPIN HCL 50 MG PO CAPS: 50.0000 mg | ORAL_CAPSULE | Freq: Every day | ORAL | Status: DC

## 2012-12-14 ENCOUNTER — Other Ambulatory Visit: Payer: Self-pay | Admitting: *Deleted

## 2012-12-19 NOTE — Letter (Signed)
December 07, 2012    To Whom It May Concern  NAME:  [name], [name] MRN:  [id number] DOB:  13-Jan-1971  Dear [name] or [name],  [name] is a patient at the [facility] in [place].  She was referred to [place] because of leukocytosis.  She has pruritus associated with this.  Our workup is continuing to find out the cause of this leukocytosis.  She has had a bone marrow test done.  I suspect that she does have a bone marrow disorder.  We are still trying to identify the exact nature of her illness.  Because of her disorder, she has a hard time working for any significant length of time.  She gets fatigued.  Again, she has this pruritus which is constant.  She must take breaks every 10 to 15 minutes.  She must get up to walk around.  She, again, has fatigue.  She has some weakness, again which is associated with this pruritus.  It is certainly possible that she may need to have some extra time off to have rest and to recuperate.  While we are trying to uncover the exact diagnosis, I would very much appreciate that your company make some accommodations for Ms. [name] so that she can still work, but yet not struggle and wear herself out and then be in a situation that would be difficult for her to work productively.  Ms. [name] cannot help the fact that she has this illness.  Again, we are trying to discover what this illness is.  She will be referred to an academic medical center for more sophisticated testing in the next week or so.  Again, I would be most appreciative if Ms. [name] could be accommodated with her recent illness, as this is a disability for her and it is making it difficult for her to work a full schedule.  I appreciate your attention to this matter.  Respectfully yours,    Josph Macho, M.D.  PRE/MEDQ  D:  12/07/2012  T:  12/19/2012  Job:  [id number]

## 2012-12-21 ENCOUNTER — Encounter: Payer: Self-pay | Admitting: Hematology & Oncology

## 2012-12-22 ENCOUNTER — Other Ambulatory Visit: Payer: Self-pay

## 2012-12-22 ENCOUNTER — Other Ambulatory Visit (HOSPITAL_COMMUNITY)
Admission: RE | Admit: 2012-12-22 | Discharge: 2012-12-22 | Disposition: A | Discharge status: Home / Self Care | Payer: BC Managed Care – PPO | Source: Ambulatory Visit | Attending: Obstetrics & Gynecology | Admitting: Obstetrics & Gynecology

## 2012-12-22 ENCOUNTER — Telehealth: Payer: Self-pay | Admitting: Hematology & Oncology

## 2012-12-22 ENCOUNTER — Other Ambulatory Visit: Payer: Self-pay | Admitting: Obstetrics & Gynecology

## 2012-12-22 DIAGNOSIS — Z1151 Encounter for screening for human papillomavirus (HPV): Secondary | ICD-10-CM | POA: Insufficient documentation

## 2012-12-22 DIAGNOSIS — Z1231 Encounter for screening mammogram for malignant neoplasm of breast: Secondary | ICD-10-CM

## 2012-12-22 DIAGNOSIS — Z01419 Encounter for gynecological examination (general) (routine) without abnormal findings: Secondary | ICD-10-CM | POA: Insufficient documentation

## 2012-12-22 NOTE — Telephone Encounter (Signed)
Faxed  Disability /FMLA papers today to: [employer], Ronnald Collum [facility] [employer] [address] [place], [place]  [address]              [phone] or [phone] fx             [phone] ph

## 2012-12-25 ENCOUNTER — Ambulatory Visit
Admission: RE | Admit: 2012-12-25 | Discharge: 2012-12-25 | Disposition: A | Discharge status: Home / Self Care | Payer: BC Managed Care – PPO | Source: Ambulatory Visit

## 2012-12-25 ENCOUNTER — Other Ambulatory Visit: Payer: Self-pay | Admitting: *Deleted

## 2012-12-25 ENCOUNTER — Encounter: Payer: Self-pay | Admitting: *Deleted

## 2012-12-25 DIAGNOSIS — D72829 Elevated white blood cell count, unspecified: Secondary | ICD-10-CM

## 2012-12-25 DIAGNOSIS — Z1231 Encounter for screening mammogram for malignant neoplasm of breast: Secondary | ICD-10-CM

## 2012-12-25 DIAGNOSIS — L299 Pruritus, unspecified: Secondary | ICD-10-CM

## 2012-12-25 DIAGNOSIS — D72821 Monocytosis (symptomatic): Secondary | ICD-10-CM

## 2012-12-25 DIAGNOSIS — D72828 Other elevated white blood cell count: Secondary | ICD-10-CM

## 2012-12-25 NOTE — Progress Notes (Signed)
Pt set up to have labs done recommended by [facility] tomorrow morning at 0800.

## 2012-12-26 ENCOUNTER — Ambulatory Visit: Payer: BC Managed Care – PPO | Admitting: Lab

## 2012-12-26 ENCOUNTER — Other Ambulatory Visit: Payer: Self-pay | Admitting: Hematology & Oncology

## 2012-12-26 DIAGNOSIS — D72829 Elevated white blood cell count, unspecified: Secondary | ICD-10-CM

## 2012-12-26 DIAGNOSIS — D72828 Other elevated white blood cell count: Secondary | ICD-10-CM

## 2012-12-26 DIAGNOSIS — L299 Pruritus, unspecified: Secondary | ICD-10-CM

## 2012-12-26 DIAGNOSIS — D72821 Monocytosis (symptomatic): Secondary | ICD-10-CM

## 2013-01-08 LAB — CALRETICULIN (CALR) MUTATION ANALYSIS

## 2013-01-11 LAB — OTHER SOLSTAS TEST

## 2013-01-19 ENCOUNTER — Other Ambulatory Visit: Payer: Self-pay | Admitting: Hematology & Oncology

## 2013-01-19 DIAGNOSIS — D72828 Other elevated white blood cell count: Secondary | ICD-10-CM

## 2013-01-19 DIAGNOSIS — E039 Hypothyroidism, unspecified: Secondary | ICD-10-CM

## 2013-01-19 DIAGNOSIS — D72829 Elevated white blood cell count, unspecified: Secondary | ICD-10-CM

## 2013-01-22 ENCOUNTER — Ambulatory Visit (HOSPITAL_BASED_OUTPATIENT_CLINIC_OR_DEPARTMENT_OTHER): Payer: BC Managed Care – PPO | Admitting: Lab

## 2013-01-22 ENCOUNTER — Ambulatory Visit (HOSPITAL_BASED_OUTPATIENT_CLINIC_OR_DEPARTMENT_OTHER): Payer: BC Managed Care – PPO | Admitting: Hematology & Oncology

## 2013-01-22 ENCOUNTER — Telehealth: Payer: Self-pay | Admitting: Hematology & Oncology

## 2013-01-22 DIAGNOSIS — E039 Hypothyroidism, unspecified: Secondary | ICD-10-CM

## 2013-01-22 DIAGNOSIS — M25559 Pain in unspecified hip: Secondary | ICD-10-CM

## 2013-01-22 DIAGNOSIS — D72828 Other elevated white blood cell count: Secondary | ICD-10-CM

## 2013-01-22 DIAGNOSIS — R109 Unspecified abdominal pain: Secondary | ICD-10-CM

## 2013-01-22 DIAGNOSIS — L299 Pruritus, unspecified: Secondary | ICD-10-CM

## 2013-01-22 DIAGNOSIS — M25551 Pain in right hip: Secondary | ICD-10-CM

## 2013-01-22 DIAGNOSIS — D72829 Elevated white blood cell count, unspecified: Secondary | ICD-10-CM

## 2013-01-22 LAB — CBC WITH DIFFERENTIAL (CANCER CENTER ONLY)
BASO#: 0.1 10*3/uL (ref 0.0–0.2)
BASO%: 0.2 % (ref 0.0–2.0)
Eosinophils Absolute: 0.3 10*3/uL (ref 0.0–0.5)
HCT: 38.9 % (ref 34.8–46.6)
HGB: 12.4 g/dL (ref 11.6–15.9)
LYMPH%: 15.1 % (ref 14.0–48.0)
MCH: 30 pg (ref 26.0–34.0)
MCHC: 31.9 g/dL — ABNORMAL LOW (ref 32.0–36.0)
MCV: 94 fL (ref 81–101)
MONO%: 5.4 % (ref 0.0–13.0)
NEUT#: 20.6 10*3/uL — ABNORMAL HIGH (ref 1.5–6.5)
NEUT%: 78.3 % (ref 39.6–80.0)
RBC: 4.13 10*6/uL (ref 3.70–5.32)

## 2013-01-22 LAB — CMP (CANCER CENTER ONLY)
ALT(SGPT): 5 U/L — ABNORMAL LOW (ref 10–47)
Albumin: 3.7 g/dL (ref 3.3–5.5)
CO2: 29 mEq/L (ref 18–33)
Glucose, Bld: 93 mg/dL (ref 73–118)
Potassium: 4.1 mEq/L (ref 3.3–4.7)
Sodium: 140 mEq/L (ref 128–145)
Total Bilirubin: 0.7 mg/dl (ref 0.20–1.60)
Total Protein: 7.8 g/dL (ref 6.4–8.1)

## 2013-01-22 LAB — TECHNOLOGIST REVIEW CHCC SATELLITE

## 2013-01-22 NOTE — Progress Notes (Signed)
This office note has been dictated.

## 2013-01-22 NOTE — Telephone Encounter (Signed)
Left on pt's phone and with friend to have pt call MD wants to see her today

## 2013-01-23 LAB — TSH CHCC: TSH: 1.55 m(IU)/L (ref 0.308–3.960)

## 2013-01-24 NOTE — Progress Notes (Signed)
CC:   Brianna Fletcher, [place] [phone]  DIAGNOSIS:  Chronic leukocytosis.  CURRENT THERAPY:  Surveillance.  INTERIM HISTORY: Brianna Fletcher comes in for a 2nd office visit.  Despite our best efforts to date, we have really not been able to find an etiology for the leukocytosis.  We have sent off incredibly __________ assays.  We have sent off the calreticulin assay.  This was negative.  We sent off the assay for TET2.  This also was negative.  She went to see Dr. Maren Reamer at [facility]  He could not find anything that was specific.  She did have a BCR-ABL analysis.  This was negative.  She had JAK2 assay.  This was negative.  We did do scans on her.  The CT scans of the chest, abdomen, and pelvis showed mild splenomegaly, but no other issues.  We also did a bone marrow test on her.  The bone marrow test was done on September 10th.  The bone marrow report ([id number]) showed hypercellular bone marrow with granulocytic proliferation.  She had decreased iron stores.  This could represent a myeloproliferative process.  There are some subtle granulopoietic changes noted.  Cytogenetics were all negative.  She still has the pruritus.  This has been quite prominent for her.  We went ahead and put her on some doxepin.  This helped a little bit.  I had her come back today for some more tests and to see how she is feeling.  She is having some intermittent abdominal pain.  This just attacks her. It is not related to eating.  There is no diarrhea with it.  There is no vomiting with it.  She also complains of intense pain in the right hip. Again, this comes on for no reason.  We will get a CT of the right hip.  She has had no obvious rash.  She has had no palpable lymph glands. There has been no hair loss.  She has had no sweats.  PHYSICAL EXAMINATION:  GENERAL:  This is a well-developed, well- nourished white female in no obvious distress. VITAL SIGNS:  Temperature of 97.5, pulse 78,  respiratory rate 14, blood pressure 129/53, weight is 235 pounds. HEENT:  Head and neck exam shows a normocephalic, atraumatic skull. There are no ocular or oral lesions.  She has no scleral icterus. NECK:  There is no adenopathy in the neck.  Thyroid is not palpable. LUNGS:  Clear bilaterally.  There are no rales, wheezes, or rhonchi. CARDIAC:  Regular rate and rhythm with a normal S1 and S2.  There are no murmurs, rubs, or bruits. ABDOMEN:  Soft.  She has good bowel sounds.  There is no fluid wave. There is no palpable abdominal mass.  There is no guarding or rebound tenderness.  There is no palpable hepatosplenomegaly. MUSCULOSKELETAL:  Hip exam shows slight tenderness to palpation of the right hip.  She has decent range of motion of the hip.  No crepitus is noted. BACK:  No tenderness over the spine. EXTREMITIES:  Show no clubbing, cyanosis, or edema. SKIN:  No rashes, ecchymoses, or petechia. NEUROLOGICAL:  Shows no focal neurological deficits.  LABORATORY STUDIES:  White cell count is 26.3, hemoglobin 12.4, hematocrit 38.9, platelet count 313.  MCV is 94.  Peripheral blood smear shows good maturation of the white blood cells. I see no hypersegmented polys.  I see no immature myeloid cells.  There may be a couple of large lymphocytes.  There were no blasts.  She had  no nucleated red blood cells.  Platelets were adequate in number and size. I saw no rouleaux formation.  She had no schistocytes or spherocytes.  IMPRESSION:  Brianna Fletcher is a very nice 42 year old white female with leukocytosis.  This is quite prominent.  She has pruritus which is very difficult for her to manage and clearly disrupting her life.  It is hard to know exactly what is going on with Brianna Fletcher.  One would have to imagine her having a myeloproliferative neoplasm.  We just cannot identify it as of yet.  I am sending off some more genetic studies.  Hopefully, these might be able to help [place] out.  We  might get on interferon.  I talked to Dr. Maren Reamer.  He is one of the hematologists at [facility].  We both decided that pegylated interferon might be an option for her.  We would like to see if this would be approved by her insurance company.  If one of the genetic assays come back positive that we did today, then we might be able to put her on Gleevec.  She is not asymptomatic as I when I first saw her and hopefully, she is getting some relief with the doxepin.  We will have to await the results of these genetic studies before we get her back to the office.  I spent a good 40 minutes with her today.    ______________________________ Josph Macho, M.D. PRE/MEDQ  D:  01/22/2013  T:  01/23/2013  Job:  [id number]

## 2013-01-25 ENCOUNTER — Ambulatory Visit (HOSPITAL_BASED_OUTPATIENT_CLINIC_OR_DEPARTMENT_OTHER)
Admission: RE | Admit: 2013-01-25 | Discharge: 2013-01-25 | Disposition: A | Discharge status: Home / Self Care | Payer: BC Managed Care – PPO | Source: Ambulatory Visit | Attending: Hematology & Oncology | Admitting: Hematology & Oncology

## 2013-01-25 DIAGNOSIS — M8569 Other cyst of bone, multiple sites: Secondary | ICD-10-CM | POA: Insufficient documentation

## 2013-01-25 DIAGNOSIS — M25551 Pain in right hip: Secondary | ICD-10-CM

## 2013-01-25 DIAGNOSIS — M169 Osteoarthritis of hip, unspecified: Secondary | ICD-10-CM | POA: Insufficient documentation

## 2013-01-25 DIAGNOSIS — M1610 Unilateral primary osteoarthritis, unspecified hip: Secondary | ICD-10-CM | POA: Insufficient documentation

## 2013-01-25 MED ORDER — IOHEXOL 300 MG/ML  SOLN
75.0000 mL | Freq: Once | INTRAMUSCULAR | Status: AC | PRN
  Administered 2013-01-25: 75 mL via INTRAVENOUS

## 2013-01-26 ENCOUNTER — Ambulatory Visit (HOSPITAL_BASED_OUTPATIENT_CLINIC_OR_DEPARTMENT_OTHER): Payer: BC Managed Care – PPO

## 2013-01-29 ENCOUNTER — Telehealth: Payer: Self-pay | Admitting: *Deleted

## 2013-01-29 NOTE — Telephone Encounter (Addendum)
Message copied by Mirian Capuchin on Mon Jan 29, 2013  9:53 AM ------      Message from: Arlan Organ R      Created: Fri Jan 26, 2013  5:27 PM       Please call and let her know that the hip CT scan show some arthritis. No other obvious abnormalities are appreciated. Thanks. Pete ------This message left on pt's home answering machine.

## 2013-02-01 LAB — OTHER SOLSTAS TEST

## 2013-03-05 ENCOUNTER — Telehealth: Payer: Self-pay | Admitting: Hematology & Oncology

## 2013-03-05 NOTE — Telephone Encounter (Signed)
Faxed Medical Records via fax today  to:  [facility] Ph: [phone] Fx: [phone]   Medical  Records requested from all to present   CONSENT COPY SCANNED

## 2013-03-08 ENCOUNTER — Encounter: Payer: Self-pay | Admitting: Hematology & Oncology

## 2013-03-08 ENCOUNTER — Other Ambulatory Visit: Payer: Self-pay | Admitting: Hematology & Oncology

## 2013-03-08 DIAGNOSIS — D471 Chronic myeloproliferative disease: Secondary | ICD-10-CM

## 2013-03-08 DIAGNOSIS — D72828 Other elevated white blood cell count: Secondary | ICD-10-CM

## 2013-03-08 HISTORY — DX: Chronic myeloproliferative disease: D47.1

## 2013-03-09 ENCOUNTER — Other Ambulatory Visit (HOSPITAL_BASED_OUTPATIENT_CLINIC_OR_DEPARTMENT_OTHER): Payer: BC Managed Care – PPO | Admitting: Lab

## 2013-03-09 ENCOUNTER — Ambulatory Visit (HOSPITAL_BASED_OUTPATIENT_CLINIC_OR_DEPARTMENT_OTHER): Payer: BC Managed Care – PPO | Admitting: Hematology & Oncology

## 2013-03-09 ENCOUNTER — Telehealth: Payer: Self-pay | Admitting: Hematology & Oncology

## 2013-03-09 VITALS — BP 134/65 | HR 77 | Temp 98.0°F | Resp 14 | Ht 65.0 in | Wt 240.0 lb

## 2013-03-09 DIAGNOSIS — D72829 Elevated white blood cell count, unspecified: Secondary | ICD-10-CM

## 2013-03-09 DIAGNOSIS — D47Z9 Other specified neoplasms of uncertain behavior of lymphoid, hematopoietic and related tissue: Secondary | ICD-10-CM

## 2013-03-09 DIAGNOSIS — D72828 Other elevated white blood cell count: Secondary | ICD-10-CM

## 2013-03-09 LAB — CBC WITH DIFFERENTIAL (CANCER CENTER ONLY)
BASO%: 0.2 % (ref 0.0–2.0)
EOS%: 0.7 % (ref 0.0–7.0)
LYMPH%: 12 % — ABNORMAL LOW (ref 14.0–48.0)
MCH: 30.2 pg (ref 26.0–34.0)
MCHC: 32.3 g/dL (ref 32.0–36.0)
MCV: 93 fL (ref 81–101)
MONO%: 5.1 % (ref 0.0–13.0)
NEUT%: 82 % — ABNORMAL HIGH (ref 39.6–80.0)
Platelets: 291 10*3/uL (ref 145–400)
RDW: 13.4 % (ref 11.1–15.7)
WBC: 26.3 10*3/uL — ABNORMAL HIGH (ref 3.9–10.0)

## 2013-03-09 LAB — RETICULOCYTES (CHCC)
ABS Retic: 92.6 10*3/uL (ref 19.0–186.0)
RBC.: 3.86 MIL/uL — ABNORMAL LOW (ref 3.87–5.11)
Retic Ct Pct: 2.4 % — ABNORMAL HIGH (ref 0.4–2.3)

## 2013-03-09 LAB — COMPREHENSIVE METABOLIC PANEL
ALT: <8 U/L (ref 0–35)
AST: 9 U/L (ref 0–37)
Albumin: 4.2 g/dL (ref 3.5–5.2)
BUN: 8 mg/dL (ref 6–23)
Calcium: 10 mg/dL (ref 8.4–10.5)
Creatinine, Ser: 0.57 mg/dL (ref 0.50–1.10)
Potassium: 3.7 mEq/L (ref 3.5–5.3)
Total Bilirubin: 0.5 mg/dL (ref 0.3–1.2)

## 2013-03-09 LAB — LACTATE DEHYDROGENASE: LDH: 122 U/L (ref 94–250)

## 2013-03-09 MED ORDER — PROCHLORPERAZINE MALEATE 10 MG PO TABS: ORAL_TABLET | ORAL | Status: DC

## 2013-03-09 MED ORDER — HYDROXYUREA 500 MG PO CAPS: ORAL_CAPSULE | ORAL | Status: DC

## 2013-03-09 NOTE — Telephone Encounter (Signed)
Pt aware of appointment today.

## 2013-03-09 NOTE — Progress Notes (Signed)
This office note has been dictated.

## 2013-03-12 LAB — IRON AND TIBC CHCC
%SAT: 21 % (ref 21–57)
TIBC: 329 ug/dL (ref 236–444)
UIBC: 261 ug/dL (ref 120–384)

## 2013-03-12 LAB — FERRITIN CHCC: Ferritin: 36 ng/ml (ref 9–269)

## 2013-03-12 NOTE — Progress Notes (Signed)
CC:   Brianna Lennox, PA-C  DIAGNOSIS:  Nondescript myeloproliferative  disorder.  CURRENT THERAPY:  Patient to start Hydrea 500 mg p.o. t.i.d.  INTERVAL HISTORY:  Brianna Fletcher comes in for a visit.  Despite all of our extensive testing, we have just not been able to identify what the specific myeloproliferative issue is.  We have done an extensive way of genetic tests.  All of our genetical studies have come back normal.  Her bone marrow biopsy that was done has clearly showed granulocytic hyperplasia.  She has pruritus.  We have her on doxepin to dealt with her pruritus.  She said that the itching is a little bit better.  We have done scans on her.  She has complained of hip issues.  We have done a CT scan of the hip.  This was the right hip.  This did not show any obvious fracture.  There was some osteoarthritis.  I brought her in, so we could talk to her about starting on Hydrea.  I just do not see any other way of trying to help this situation.  We tried to get interferon.  However, because we did not have a firm diagnosis of a specific myeloproliferative process, insurance would not let us use interferon.  I feel that Hydrea should still be okay.  She has had some fatigue.  She has had no change in bowel or bladder habits.  She has had no cough.  There has been no fever.  Overall, her performance status is ECOG 1.  PHYSICAL EXAMINATION:  General:  This is a well developed, well- nourished white female in no obvious distress.  Vital Signs: Temperature of 98, pulse 77, respiratory rate 14, blood pressure 134/65. Weight is 240 pounds.  HEENT:  Normocephalic, atraumatic skull.  There are no ocular or oral lesions.  There are no palpable cervical or supraclavicular lymph nodes.  Lungs:  Clear bilaterally.  Cardiac: Regular rate and rhythm with a normal S1, S2.  There are no murmurs, rubs, or bruits.  Abdomen:  Soft.  She has good bowel sounds.  There is no fluid wave.   There is no palpable abdominal mass.  There may be some slight tenderness in the right upper quadrant.  No hepatomegaly is noted.  There is no splenomegaly.  Back:  Shows some tenderness over her hips bilaterally.  Extremities:  Show no clubbing, cyanosis, or edema. She has good range motion of her joints.  She has good strength in her arms and legs.  Skin:  No rashes, ecchymoses, or petechiae.  She has some slight dry skin.  Neurological:  Shows no focal neurological deficits.  LABORATORY STUDIES:  White cell count is 26.3, hemoglobin 11.4, hematocrit 35.3, platelet count 291.  White cell differential shows 82 segs, 12 lymphocytes, 5 monos.  Her peripheral smear shows good maturation of her white blood cells.  I do not see any immature myeloid cells.  She has a couple of hypersegmented polys.  I see no obvious blasts.  There are no nucleated red cells.  I see no teardrop cells.  Platelets are adequate in size and shape.  IMPRESSION:  Brianna Fletcher is a 42 year old white female.  She has chronic leukocytosis.  A bone marrow is suggestive of a myeloproliferative process.  Again, we have done extensive battery of tests and we have never found an obvious specific etiology for this.  I still feel that we are dealing with a myeloproliferative neoplasm.  As such, I think that  Hydrea would be reasonable to try.  I spent a good 40 minutes with her or so.  I explained to her what Hydrea was and how it works.  I told her that she would take it 3 times a day with meals.  I went over the side effects.  I told her it can cause nausea, diarrhea, possible skin rash, fatigue, bleeding.  Again, we need to check her blood counts weekly.  We will give her some Compazine to try to help with the nausea that she may have.  Again, this is an incredibly complicated situation.  We will send her out to [facility].  They also think she has a myeloproliferative neoplasm, but again we cannot specifically define  this.  I will have her blood checked weekly.  I will see her back myself in 1 month.    ______________________________ Josph Macho, M.D. PRE/MEDQ  D:  03/09/2013  T:  03/10/2013  Job:  [id number]

## 2013-03-13 ENCOUNTER — Telehealth: Payer: Self-pay | Admitting: Hematology & Oncology

## 2013-03-13 NOTE — Telephone Encounter (Signed)
Left pt message with 1-2 and 1-19 appointments

## 2013-03-14 ENCOUNTER — Telehealth: Payer: Self-pay | Admitting: Nurse Practitioner

## 2013-03-14 NOTE — Telephone Encounter (Addendum)
Message copied by Glee Arvin on Wed Mar 14, 2013 10:03 AM ------      Message from: Josph Macho      Created: Tue Mar 13, 2013 10:16 PM       Call - iron is low!! Let's try Feraheme 1020mg  x 1 dose to see if this helps her blood count!!  Please set up at patient's convenience!!  Cindee Lame -----pt verbalized understanding and appreciation. Will check her schedule and give Raiford Noble a call back this week. -

## 2013-03-20 ENCOUNTER — Encounter (HOSPITAL_COMMUNITY): Payer: Self-pay

## 2013-03-21 ENCOUNTER — Ambulatory Visit (HOSPITAL_BASED_OUTPATIENT_CLINIC_OR_DEPARTMENT_OTHER): Payer: BC Managed Care – PPO

## 2013-03-21 DIAGNOSIS — D471 Chronic myeloproliferative disease: Secondary | ICD-10-CM

## 2013-03-21 DIAGNOSIS — D72829 Elevated white blood cell count, unspecified: Secondary | ICD-10-CM

## 2013-03-21 MED ORDER — SODIUM CHLORIDE 0.9 % IV SOLN
1020.0000 mg | Freq: Once | INTRAVENOUS | Status: AC
  Administered 2013-03-21: 1020 mg via INTRAVENOUS
  Filled 2013-03-21: qty 34

## 2013-03-21 NOTE — Patient Instructions (Signed)

## 2013-03-23 ENCOUNTER — Other Ambulatory Visit: Payer: BC Managed Care – PPO | Admitting: Lab

## 2013-03-30 ENCOUNTER — Other Ambulatory Visit (HOSPITAL_BASED_OUTPATIENT_CLINIC_OR_DEPARTMENT_OTHER): Payer: BC Managed Care – PPO | Admitting: Lab

## 2013-03-30 DIAGNOSIS — D47Z9 Other specified neoplasms of uncertain behavior of lymphoid, hematopoietic and related tissue: Secondary | ICD-10-CM

## 2013-03-30 DIAGNOSIS — D471 Chronic myeloproliferative disease: Secondary | ICD-10-CM

## 2013-03-30 LAB — CBC WITH DIFFERENTIAL (CANCER CENTER ONLY)
BASO#: 0 10*3/uL (ref 0.0–0.2)
BASO%: 0.2 % (ref 0.0–2.0)
EOS%: 0.5 % (ref 0.0–7.0)
Eosinophils Absolute: 0.1 10*3/uL (ref 0.0–0.5)
HCT: 37.1 % (ref 34.8–46.6)
HGB: 11.9 g/dL (ref 11.6–15.9)
LYMPH#: 3.5 10*3/uL — AB (ref 0.9–3.3)
LYMPH%: 13.5 % — ABNORMAL LOW (ref 14.0–48.0)
MCH: 30.4 pg (ref 26.0–34.0)
MCHC: 32.1 g/dL (ref 32.0–36.0)
MCV: 95 fL (ref 81–101)
MONO#: 1 10*3/uL — ABNORMAL HIGH (ref 0.1–0.9)
MONO%: 3.8 % (ref 0.0–13.0)
NEUT#: 21.4 10*3/uL — ABNORMAL HIGH (ref 1.5–6.5)
NEUT%: 82 % — AB (ref 39.6–80.0)
Platelets: 288 10*3/uL (ref 145–400)
RBC: 3.92 10*6/uL (ref 3.70–5.32)
RDW: 13.3 % (ref 11.1–15.7)
WBC: 26 10*3/uL — ABNORMAL HIGH (ref 3.9–10.0)

## 2013-03-30 LAB — TECHNOLOGIST REVIEW CHCC SATELLITE

## 2013-04-09 ENCOUNTER — Encounter: Payer: Self-pay | Admitting: Hematology & Oncology

## 2013-04-09 ENCOUNTER — Ambulatory Visit (HOSPITAL_BASED_OUTPATIENT_CLINIC_OR_DEPARTMENT_OTHER): Payer: BC Managed Care – PPO | Admitting: Hematology & Oncology

## 2013-04-09 ENCOUNTER — Other Ambulatory Visit (HOSPITAL_BASED_OUTPATIENT_CLINIC_OR_DEPARTMENT_OTHER): Payer: BC Managed Care – PPO | Admitting: Lab

## 2013-04-09 VITALS — BP 147/61 | HR 71 | Temp 97.7°F | Resp 14 | Ht 67.0 in | Wt 238.0 lb

## 2013-04-09 DIAGNOSIS — D471 Chronic myeloproliferative disease: Secondary | ICD-10-CM

## 2013-04-09 DIAGNOSIS — D47Z9 Other specified neoplasms of uncertain behavior of lymphoid, hematopoietic and related tissue: Secondary | ICD-10-CM

## 2013-04-09 LAB — LACTATE DEHYDROGENASE: LDH: 147 U/L (ref 94–250)

## 2013-04-09 LAB — CBC WITH DIFFERENTIAL (CANCER CENTER ONLY)
BASO#: 0 10*3/uL (ref 0.0–0.2)
BASO%: 0.2 % (ref 0.0–2.0)
EOS ABS: 0.2 10*3/uL (ref 0.0–0.5)
EOS%: 0.8 % (ref 0.0–7.0)
HCT: 38.1 % (ref 34.8–46.6)
HEMOGLOBIN: 12.3 g/dL (ref 11.6–15.9)
LYMPH#: 2.5 10*3/uL (ref 0.9–3.3)
LYMPH%: 11.1 % — ABNORMAL LOW (ref 14.0–48.0)
MCH: 31.3 pg (ref 26.0–34.0)
MCHC: 32.3 g/dL (ref 32.0–36.0)
MCV: 97 fL (ref 81–101)
MONO#: 1.1 10*3/uL — ABNORMAL HIGH (ref 0.1–0.9)
MONO%: 4.7 % (ref 0.0–13.0)
NEUT%: 83.2 % — ABNORMAL HIGH (ref 39.6–80.0)
NEUTROS ABS: 18.8 10*3/uL — AB (ref 1.5–6.5)
Platelets: 307 10*3/uL (ref 145–400)
RBC: 3.93 10*6/uL (ref 3.70–5.32)
RDW: 15.7 % (ref 11.1–15.7)
WBC: 22.6 10*3/uL — ABNORMAL HIGH (ref 3.9–10.0)

## 2013-04-09 LAB — CMP (CANCER CENTER ONLY)
ALT: 21 U/L (ref 10–47)
AST: 18 U/L (ref 11–38)
Albumin: 3.8 g/dL (ref 3.3–5.5)
Alkaline Phosphatase: 137 U/L — ABNORMAL HIGH (ref 26–84)
BUN, Bld: 11 mg/dL (ref 7–22)
CALCIUM: 10.3 mg/dL (ref 8.0–10.3)
CHLORIDE: 97 meq/L — AB (ref 98–108)
CO2: 30 mEq/L (ref 18–33)
Creat: 0.5 mg/dl — ABNORMAL LOW (ref 0.6–1.2)
Glucose, Bld: 113 mg/dL (ref 73–118)
Potassium: 4.4 mEq/L (ref 3.3–4.7)
SODIUM: 139 meq/L (ref 128–145)
TOTAL PROTEIN: 7.8 g/dL (ref 6.4–8.1)
Total Bilirubin: 0.7 mg/dl (ref 0.20–1.60)

## 2013-04-09 LAB — URIC ACID: URIC ACID, SERUM: 5.4 mg/dL (ref 2.4–7.0)

## 2013-04-09 NOTE — Progress Notes (Signed)
This office note has been dictated.

## 2013-04-10 NOTE — Progress Notes (Signed)
CC:   Baruch Goldmann, PA-C  DIAGNOSIS:  Chronic myeloproliferative syndrome.  CURRENT THERAPY:  Hydrea 500 mg p.o. t.i.d.  INTERIM HISTORY:  Ms. Yearwood comes in for followup.  We have her on Hydrea now.  She has tolerated it fairly well.  She still has some occasional itching.  She has had no bleeding.  There has been no diarrhea.  She has had a little bit of cough.  She has felt a little bit rough over the past couple days.  I suppose it may be some kind of viral syndrome.  She has had no headache.  PHYSICAL EXAMINATION:  General:  This is a well-developed, well- nourished white female in no obvious distress.  Vital Signs: Temperature of 97.7, pulse 71, respiratory rate 14, blood pressure 147/61, and weight is 238 pounds.  Head and Neck:  Normocephalic, atraumatic skull.  There are no ocular or oral lesions.  There are no palpable cervical or supraclavicular lymph nodes.  Lungs:  Clear bilaterally.  Cardiac:  Regular rate and rhythm with a normal S1 and S2. There are no murmurs, rubs, or bruits.  Abdomen:  Soft.  She has good bowel sounds.  There is no fluid wave.  There is no palpable abdominal mass.  There is no palpable pedal splenomegaly.  Extremities:  No clubbing, cyanosis, or edema.  Skin:  No rashes, ecchymosis, or petechia.  LABORATORY STUDIES:  White cell count is 22.6, hemoglobin 12.3, hematocrit 38.1, and platelet count 307.  Peripheral smear shows increased white blood cells.  These appear to be mature polys.  I really do not see any myelocytes and metamyelocytes. There is no atypical lymphocytes.  She has no blasts.  There are no nucleated red blood cells.  There may be some slight anisocytosis and poikilocytosis.  IMPRESSION:  Ms. Jagodzinski is a very nice 43 year old white female.  She has a chronic underlying myeloproliferative process.  Again, I am not sure as to how we can categorize this aside of a myeloproliferative proliferative disorder.  I forgot to  mention that she did have some IV iron.  This was back in December.  We will go ahead and follow her blood work every 2 weeks now.  I will see her back myself in another month.  We will be able to cut down her dose of Hydrea.    ______________________________ [name], M.D. PRE/MEDQ  D:  04/09/2013  T:  04/10/2013  Job:  [id number]

## 2013-04-18 ENCOUNTER — Other Ambulatory Visit: Payer: Self-pay | Admitting: Hematology & Oncology

## 2013-04-25 ENCOUNTER — Other Ambulatory Visit (HOSPITAL_BASED_OUTPATIENT_CLINIC_OR_DEPARTMENT_OTHER): Payer: BC Managed Care – PPO | Admitting: Lab

## 2013-04-25 DIAGNOSIS — D47Z9 Other specified neoplasms of uncertain behavior of lymphoid, hematopoietic and related tissue: Secondary | ICD-10-CM

## 2013-04-25 DIAGNOSIS — D471 Chronic myeloproliferative disease: Secondary | ICD-10-CM

## 2013-04-25 LAB — CBC WITH DIFFERENTIAL (CANCER CENTER ONLY)
BASO#: 0 10*3/uL (ref 0.0–0.2)
BASO%: 0.2 % (ref 0.0–2.0)
EOS%: 0.5 % (ref 0.0–7.0)
Eosinophils Absolute: 0.1 10*3/uL (ref 0.0–0.5)
HCT: 36.6 % (ref 34.8–46.6)
HGB: 12 g/dL (ref 11.6–15.9)
LYMPH#: 3 10*3/uL (ref 0.9–3.3)
LYMPH%: 12.7 % — AB (ref 14.0–48.0)
MCH: 32.3 pg (ref 26.0–34.0)
MCHC: 32.8 g/dL (ref 32.0–36.0)
MCV: 98 fL (ref 81–101)
MONO#: 1.2 10*3/uL — ABNORMAL HIGH (ref 0.1–0.9)
MONO%: 5 % (ref 0.0–13.0)
NEUT#: 19.2 10*3/uL — ABNORMAL HIGH (ref 1.5–6.5)
NEUT%: 81.6 % — ABNORMAL HIGH (ref 39.6–80.0)
PLATELETS: 285 10*3/uL (ref 145–400)
RBC: 3.72 10*6/uL (ref 3.70–5.32)
RDW: 17.8 % — ABNORMAL HIGH (ref 11.1–15.7)
WBC: 23.5 10*3/uL — ABNORMAL HIGH (ref 3.9–10.0)

## 2013-05-11 ENCOUNTER — Encounter: Payer: Self-pay | Admitting: Hematology & Oncology

## 2013-05-11 ENCOUNTER — Ambulatory Visit (HOSPITAL_BASED_OUTPATIENT_CLINIC_OR_DEPARTMENT_OTHER): Payer: BC Managed Care – PPO | Admitting: Hematology & Oncology

## 2013-05-11 ENCOUNTER — Other Ambulatory Visit (HOSPITAL_BASED_OUTPATIENT_CLINIC_OR_DEPARTMENT_OTHER): Payer: BC Managed Care – PPO | Admitting: Lab

## 2013-05-11 VITALS — BP 128/66 | HR 74 | Temp 97.8°F | Resp 14 | Ht 62.0 in | Wt 240.0 lb

## 2013-05-11 DIAGNOSIS — D47Z9 Other specified neoplasms of uncertain behavior of lymphoid, hematopoietic and related tissue: Secondary | ICD-10-CM

## 2013-05-11 DIAGNOSIS — D471 Chronic myeloproliferative disease: Secondary | ICD-10-CM

## 2013-05-11 LAB — CMP (CANCER CENTER ONLY)
ALK PHOS: 116 U/L — AB (ref 26–84)
ALT(SGPT): 10 U/L (ref 10–47)
AST: 18 U/L (ref 11–38)
Albumin: 3.9 g/dL (ref 3.3–5.5)
BUN, Bld: 9 mg/dL (ref 7–22)
CALCIUM: 10 mg/dL (ref 8.0–10.3)
CHLORIDE: 102 meq/L (ref 98–108)
CO2: 29 mEq/L (ref 18–33)
Creat: 0.6 mg/dl (ref 0.6–1.2)
Glucose, Bld: 92 mg/dL (ref 73–118)
POTASSIUM: 4 meq/L (ref 3.3–4.7)
SODIUM: 140 meq/L (ref 128–145)
TOTAL PROTEIN: 7.5 g/dL (ref 6.4–8.1)
Total Bilirubin: 0.7 mg/dl (ref 0.20–1.60)

## 2013-05-11 LAB — CBC WITH DIFFERENTIAL (CANCER CENTER ONLY)
BASO#: 0.1 10*3/uL (ref 0.0–0.2)
BASO%: 0.2 % (ref 0.0–2.0)
EOS%: 0.4 % (ref 0.0–7.0)
Eosinophils Absolute: 0.1 10*3/uL (ref 0.0–0.5)
HCT: 36.6 % (ref 34.8–46.6)
HGB: 12 g/dL (ref 11.6–15.9)
LYMPH#: 3 10*3/uL (ref 0.9–3.3)
LYMPH%: 12.4 % — ABNORMAL LOW (ref 14.0–48.0)
MCH: 33.5 pg (ref 26.0–34.0)
MCHC: 32.8 g/dL (ref 32.0–36.0)
MCV: 102 fL — ABNORMAL HIGH (ref 81–101)
MONO#: 1.2 10*3/uL — ABNORMAL HIGH (ref 0.1–0.9)
MONO%: 4.8 % (ref 0.0–13.0)
NEUT%: 82.2 % — ABNORMAL HIGH (ref 39.6–80.0)
NEUTROS ABS: 20.2 10*3/uL — AB (ref 1.5–6.5)
Platelets: 268 10*3/uL (ref 145–400)
RBC: 3.58 10*6/uL — ABNORMAL LOW (ref 3.70–5.32)
RDW: 19.1 % — AB (ref 11.1–15.7)
WBC: 24.6 10*3/uL — ABNORMAL HIGH (ref 3.9–10.0)

## 2013-05-11 LAB — CHCC SATELLITE - SMEAR

## 2013-05-11 LAB — TECHNOLOGIST REVIEW CHCC SATELLITE

## 2013-05-11 MED ORDER — HYDROXYUREA 500 MG PO CAPS: ORAL_CAPSULE | ORAL | Status: DC

## 2013-05-13 NOTE — Progress Notes (Signed)
CC:   Baruch Goldmann, PA-C  DIAGNOSIS:  Chronic myeloproliferative syndrome.  CURRENT THERAPY:  Hydrea 500 mg p.o. t.i.d.  INTERIM HISTORY:  Brianna Fletcher comes in for followup.  We have her on Hydrea now.  She has tolerated it fairly well.  She still has some occasional itching.  She has had no bleeding.  There has been no diarrhea.  She has had a little bit of cough.  She has felt a little bit rough over the past couple days.  I suppose it may be some kind of viral syndrome.  She has had no headache.  PHYSICAL EXAMINATION:  General:  This is a well-developed, well- nourished white female in no obvious distress.  Vital Signs: Temperature of 98.7, pulse 76, respiratory rate 14, blood pressure 140/74, and weight is 238 pounds.  Head and Neck:  Normocephalic, atraumatic skull.  There are no ocular or oral lesions.  There are no palpable cervical or supraclavicular lymph nodes.  Lungs:  Clear bilaterally.  Cardiac:  Regular rate and rhythm with a normal S1 and S2. There are no murmurs, rubs, or bruits.  Abdomen:  Soft.  She has good bowel sounds.  There is no fluid wave.  There is no palpable abdominal mass.  There is no palpable pedal splenomegaly.  Extremities:  No clubbing, cyanosis, or edema.  Skin:  No rashes, ecchymosis, or petechia.  LABORATORY STUDIES:  White cell count is 24.6, hemoglobin 12.0, hematocrit 36.6, and platelet count 268.  Peripheral smear shows increased white blood cells.  These appear to be mature polys.  I really do not see any myelocytes and metamyelocytes. There is no atypical lymphocytes.  She has no blasts.  There are no nucleated red blood cells.  There may be some slight anisocytosis and poikilocytosis.  IMPRESSION:  Brianna Fletcher is a very nice 43 year old white female.  She has a chronic underlying myeloproliferative process.  Again, I am not sure as to how we can categorize this aside of a myeloproliferative proliferative disorder.  I forgot to  mention that she did have some IV iron.  This was back in December.  We will go ahead and follow her blood work every 2 weeks now.  Unfortunately, her WBC is no longer responding.  As such, I think that we need to try and increase the dose of Hydrea to 1000mg  po TID.   Will need to check CBC q week.  If we do not see a good response, then I think we may need to consider Interferon.  I spent a good 21min with her explaining the situation.  As always, she is "game" for anything to try to feel better!!!  I will see her back myself in another month.      ______________________________

## 2013-05-24 ENCOUNTER — Telehealth: Payer: Self-pay | Admitting: Hematology & Oncology

## 2013-05-24 NOTE — Telephone Encounter (Signed)
Patient called and change 05/25/13 apt time from 2:45 to 11:45

## 2013-05-25 ENCOUNTER — Other Ambulatory Visit: Payer: BC Managed Care – PPO | Admitting: Lab

## 2013-05-25 ENCOUNTER — Other Ambulatory Visit (HOSPITAL_BASED_OUTPATIENT_CLINIC_OR_DEPARTMENT_OTHER): Payer: BC Managed Care – PPO | Admitting: Lab

## 2013-05-25 DIAGNOSIS — D47Z9 Other specified neoplasms of uncertain behavior of lymphoid, hematopoietic and related tissue: Secondary | ICD-10-CM

## 2013-05-25 DIAGNOSIS — D471 Chronic myeloproliferative disease: Secondary | ICD-10-CM

## 2013-05-25 LAB — CBC WITH DIFFERENTIAL (CANCER CENTER ONLY)
BASO#: 0 10*3/uL (ref 0.0–0.2)
BASO%: 0.1 % (ref 0.0–2.0)
EOS%: 0.4 % (ref 0.0–7.0)
Eosinophils Absolute: 0.1 10*3/uL (ref 0.0–0.5)
HEMATOCRIT: 35.1 % (ref 34.8–46.6)
HEMOGLOBIN: 11.6 g/dL (ref 11.6–15.9)
LYMPH#: 2 10*3/uL (ref 0.9–3.3)
LYMPH%: 12.2 % — AB (ref 14.0–48.0)
MCH: 35.3 pg — ABNORMAL HIGH (ref 26.0–34.0)
MCHC: 33 g/dL (ref 32.0–36.0)
MCV: 107 fL — AB (ref 81–101)
MONO#: 0.5 10*3/uL (ref 0.1–0.9)
MONO%: 3.1 % (ref 0.0–13.0)
NEUT#: 13.8 10*3/uL — ABNORMAL HIGH (ref 1.5–6.5)
NEUT%: 84.2 % — AB (ref 39.6–80.0)
Platelets: 254 10*3/uL (ref 145–400)
RBC: 3.29 10*6/uL — ABNORMAL LOW (ref 3.70–5.32)
RDW: 20 % — AB (ref 11.1–15.7)
WBC: 16.3 10*3/uL — ABNORMAL HIGH (ref 3.9–10.0)

## 2013-05-30 ENCOUNTER — Telehealth: Payer: Self-pay | Admitting: Nurse Practitioner

## 2013-05-30 NOTE — Telephone Encounter (Addendum)
Message copied by Jimmy Footman on Wed May 30, 2013 10:46 AM ------      Message from: Burney Gauze R      Created: Mon May 28, 2013  6:48 AM       Call - WBC are responding now!!   Keep taking the Hydrea at same dose!!!  Pete ------LVM on pt's personal machine and advised. Instructed her to contact the office with any further questions or concerns.

## 2013-06-08 ENCOUNTER — Other Ambulatory Visit (HOSPITAL_BASED_OUTPATIENT_CLINIC_OR_DEPARTMENT_OTHER): Payer: BC Managed Care – PPO | Admitting: Lab

## 2013-06-08 DIAGNOSIS — D471 Chronic myeloproliferative disease: Secondary | ICD-10-CM

## 2013-06-08 DIAGNOSIS — D47Z9 Other specified neoplasms of uncertain behavior of lymphoid, hematopoietic and related tissue: Secondary | ICD-10-CM

## 2013-06-08 LAB — CBC WITH DIFFERENTIAL (CANCER CENTER ONLY)
BASO#: 0 10*3/uL (ref 0.0–0.2)
BASO%: 0.1 % (ref 0.0–2.0)
EOS%: 0.3 % (ref 0.0–7.0)
Eosinophils Absolute: 0 10*3/uL (ref 0.0–0.5)
HCT: 32.1 % — ABNORMAL LOW (ref 34.8–46.6)
HEMOGLOBIN: 11 g/dL — AB (ref 11.6–15.9)
LYMPH#: 2 10*3/uL (ref 0.9–3.3)
LYMPH%: 16.5 % (ref 14.0–48.0)
MCH: 38.1 pg — AB (ref 26.0–34.0)
MCHC: 34.3 g/dL (ref 32.0–36.0)
MCV: 111 fL — ABNORMAL HIGH (ref 81–101)
MONO#: 0.3 10*3/uL (ref 0.1–0.9)
MONO%: 2.7 % (ref 0.0–13.0)
NEUT%: 80.4 % — ABNORMAL HIGH (ref 39.6–80.0)
NEUTROS ABS: 9.5 10*3/uL — AB (ref 1.5–6.5)
Platelets: 226 10*3/uL (ref 145–400)
RBC: 2.89 10*6/uL — ABNORMAL LOW (ref 3.70–5.32)
RDW: 19.9 % — AB (ref 11.1–15.7)
WBC: 11.8 10*3/uL — ABNORMAL HIGH (ref 3.9–10.0)

## 2013-06-20 ENCOUNTER — Ambulatory Visit (HOSPITAL_BASED_OUTPATIENT_CLINIC_OR_DEPARTMENT_OTHER): Payer: BC Managed Care – PPO | Admitting: Hematology & Oncology

## 2013-06-20 ENCOUNTER — Other Ambulatory Visit (HOSPITAL_BASED_OUTPATIENT_CLINIC_OR_DEPARTMENT_OTHER): Payer: BC Managed Care – PPO | Admitting: Lab

## 2013-06-20 VITALS — BP 119/58 | HR 73 | Temp 98.0°F | Resp 16 | Wt 245.0 lb

## 2013-06-20 DIAGNOSIS — D47Z9 Other specified neoplasms of uncertain behavior of lymphoid, hematopoietic and related tissue: Secondary | ICD-10-CM

## 2013-06-20 DIAGNOSIS — D471 Chronic myeloproliferative disease: Secondary | ICD-10-CM

## 2013-06-20 DIAGNOSIS — D72829 Elevated white blood cell count, unspecified: Secondary | ICD-10-CM

## 2013-06-20 LAB — CBC WITH DIFFERENTIAL (CANCER CENTER ONLY)
BASO#: 0 10*3/uL (ref 0.0–0.2)
BASO%: 0.1 % (ref 0.0–2.0)
EOS%: 0.3 % (ref 0.0–7.0)
Eosinophils Absolute: 0 10*3/uL (ref 0.0–0.5)
HCT: 29.8 % — ABNORMAL LOW (ref 34.8–46.6)
HEMOGLOBIN: 10.3 g/dL — AB (ref 11.6–15.9)
LYMPH#: 1.5 10*3/uL (ref 0.9–3.3)
LYMPH%: 21.3 % (ref 14.0–48.0)
MCH: 40.7 pg — AB (ref 26.0–34.0)
MCHC: 34.6 g/dL (ref 32.0–36.0)
MCV: 118 fL — ABNORMAL HIGH (ref 81–101)
MONO#: 0.3 10*3/uL (ref 0.1–0.9)
MONO%: 4.2 % (ref 0.0–13.0)
NEUT#: 5.3 10*3/uL (ref 1.5–6.5)
NEUT%: 74.1 % (ref 39.6–80.0)
Platelets: 139 10*3/uL — ABNORMAL LOW (ref 145–400)
RBC: 2.53 10*6/uL — ABNORMAL LOW (ref 3.70–5.32)
RDW: 18.3 % — ABNORMAL HIGH (ref 11.1–15.7)
WBC: 7.2 10*3/uL (ref 3.9–10.0)

## 2013-06-20 LAB — CMP (CANCER CENTER ONLY)
ALK PHOS: 112 U/L — AB (ref 26–84)
ALT(SGPT): 19 U/L (ref 10–47)
AST: 22 U/L (ref 11–38)
Albumin: 3.6 g/dL (ref 3.3–5.5)
BILIRUBIN TOTAL: 1 mg/dL (ref 0.20–1.60)
BUN: 8 mg/dL (ref 7–22)
CO2: 30 mEq/L (ref 18–33)
Calcium: 9.7 mg/dL (ref 8.0–10.3)
Chloride: 99 mEq/L (ref 98–108)
Creat: 0.5 mg/dl — ABNORMAL LOW (ref 0.6–1.2)
GLUCOSE: 116 mg/dL (ref 73–118)
Potassium: 3.7 mEq/L (ref 3.3–4.7)
Sodium: 139 mEq/L (ref 128–145)
Total Protein: 7.5 g/dL (ref 6.4–8.1)

## 2013-06-20 LAB — SEDIMENTATION RATE: SED RATE: 55 mm/h — AB (ref 0–22)

## 2013-06-20 LAB — LACTATE DEHYDROGENASE: LDH: 123 U/L (ref 94–250)

## 2013-06-20 LAB — CHCC SATELLITE - SMEAR

## 2013-06-25 NOTE — Progress Notes (Signed)
Hematology and Oncology Follow Up Visit  Brianna Fletcher [id number] 1971-03-21 43 y.o. 06/25/2013   Principle Diagnosis:   Chronic myeloproliferative syndrome-JAK2 negative  Current Therapy:    Hydrea 1000 mg by mouth 3 times a day     Interim History:  [name] is back for followup. She still feels tired. She still has a lot of pruritus. Her white cell count is coming down. I think we can probably get Hydrea dose readjusted.  [name] had no rash. [name] had no cough. There's been no shortness of breath. There's been no diarrhea. [name] had no leg swelling. [name] had no fever. She is still working. She is trying to work full-time.  [name] not sleeping all that well. This is been more chronic problem.  Medications: Current outpatient prescriptions:doxepin (SINEQUAN) 50 MG capsule, TAKE 1 CAPSULE BY MOUTH AT BEDTIME, Disp: 30 capsule, Rfl: 3;  hydroxyurea (HYDREA) 500 MG capsule, Take 3 capsule 2 times a day with food., Disp: 180 capsule, Rfl: 4;  prochlorperazine (COMPAZINE) 10 MG tablet, Take 1 pill 30 min before Hydrea, if needed, for nausea, Disp: 30 tablet, Rfl: 0  Allergies: No Known Allergies  Past Medical History, Surgical history, Social history, and Family History were reviewed and updated.  Review of Systems: As above  Physical Exam:  weight is 245 lb (111.131 kg). Her oral temperature is 98 F (36.7 C). Her blood pressure is 119/58 and her pulse is 73. Her respiration is 16.   Well-developed and well-nourished white  female. Lungs are clear. Cardiac exam regular in rhythm. Abdomen is soft. There is no liver spleen tip. Abdomen shows no fluid. Lymph node exam is negative. Extremities shows no clubbing cyanosis or edema. Skin exam no rashes. Neurological exam no focal neurological deficits.  Lab Results  Component Value Date   WBC 7.2 06/20/2013   HGB 10.3* 06/20/2013   HCT 29.8* 06/20/2013   MCV 118* 06/20/2013   PLT 139* 06/20/2013     Chemistry      Component Value Date/Time    NA 139 06/20/2013 1415   NA 137 03/09/2013 1535   K 3.7 06/20/2013 1415   K 3.7 03/09/2013 1535   CL 99 06/20/2013 1415   CL 104 03/09/2013 1535   CO2 30 06/20/2013 1415   CO2 26 03/09/2013 1535   BUN 8 06/20/2013 1415   BUN 8 03/09/2013 1535   CREATININE 0.5* 06/20/2013 1415   CREATININE 0.57 03/09/2013 1535      Component Value Date/Time   CALCIUM 9.7 06/20/2013 1415   CALCIUM 10.0 03/09/2013 1535   ALKPHOS 112* 06/20/2013 1415   ALKPHOS 120* 03/09/2013 1535   AST 22 06/20/2013 1415   AST 9 03/09/2013 1535   ALT 19 06/20/2013 1415   ALT <8 03/09/2013 1535   BILITOT 1.00 06/20/2013 1415   BILITOT 0.5 03/09/2013 1535         Impression and Plan: Brianna Fletcher is 43 year old white female. She has is non-descriptive myeloproliferative syndrome. We've done multiple genetic studies on her. So far, everything has come back negative   Hydrea is working. Her white cell count is now down to 7.2.  We will cut her Hydrea dose back to 1000 mg by mouth twice a day.  We will continue to check her blood counts every one or 2 weeks.  I'll see her back myself in 6 weeks. There was a half-hour with her today. I went over her lab work. I will get her blood smear. The  blood smear looked okay without immature myeloid cells.   [name], MD 4/6/20156:36 PM

## 2013-07-06 ENCOUNTER — Encounter: Payer: Self-pay | Admitting: *Deleted

## 2013-07-06 ENCOUNTER — Other Ambulatory Visit (HOSPITAL_BASED_OUTPATIENT_CLINIC_OR_DEPARTMENT_OTHER): Payer: BC Managed Care – PPO | Admitting: Lab

## 2013-07-06 DIAGNOSIS — D471 Chronic myeloproliferative disease: Secondary | ICD-10-CM

## 2013-07-06 DIAGNOSIS — D47Z9 Other specified neoplasms of uncertain behavior of lymphoid, hematopoietic and related tissue: Secondary | ICD-10-CM

## 2013-07-06 LAB — CBC WITH DIFFERENTIAL (CANCER CENTER ONLY)
BASO#: 0 10*3/uL (ref 0.0–0.2)
BASO%: 0.1 % (ref 0.0–2.0)
EOS%: 0.2 % (ref 0.0–7.0)
Eosinophils Absolute: 0 10*3/uL (ref 0.0–0.5)
HCT: 33.7 % — ABNORMAL LOW (ref 34.8–46.6)
HEMOGLOBIN: 11.7 g/dL (ref 11.6–15.9)
LYMPH#: 3 10*3/uL (ref 0.9–3.3)
LYMPH%: 16.2 % (ref 14.0–48.0)
MCH: 42.5 pg — AB (ref 26.0–34.0)
MCHC: 34.7 g/dL (ref 32.0–36.0)
MCV: 123 fL — AB (ref 81–101)
MONO#: 0.8 10*3/uL (ref 0.1–0.9)
MONO%: 4.1 % (ref 0.0–13.0)
NEUT#: 14.7 10*3/uL — ABNORMAL HIGH (ref 1.5–6.5)
NEUT%: 79.4 % (ref 39.6–80.0)
Platelets: 386 10*3/uL (ref 145–400)
RBC: 2.75 10*6/uL — AB (ref 3.70–5.32)
RDW: 17.4 % — ABNORMAL HIGH (ref 11.1–15.7)
WBC: 18.5 10*3/uL — ABNORMAL HIGH (ref 3.9–10.0)

## 2013-07-06 NOTE — Progress Notes (Signed)
Patient here for labwork.  WBC 18.5 and platelets 386  Patient currently on 4 pills of Hydrea daily 500 mg each).  Dr. Niel Hummer wants patient to increase to 6 pills daily.  Called patient with this information.  Left on confidential voicemail per patient instruction.

## 2013-07-20 ENCOUNTER — Other Ambulatory Visit (HOSPITAL_BASED_OUTPATIENT_CLINIC_OR_DEPARTMENT_OTHER): Payer: BC Managed Care – PPO | Admitting: Lab

## 2013-07-20 DIAGNOSIS — D471 Chronic myeloproliferative disease: Secondary | ICD-10-CM

## 2013-07-20 DIAGNOSIS — D47Z9 Other specified neoplasms of uncertain behavior of lymphoid, hematopoietic and related tissue: Secondary | ICD-10-CM

## 2013-07-20 LAB — CBC WITH DIFFERENTIAL (CANCER CENTER ONLY)
BASO#: 0 10*3/uL (ref 0.0–0.2)
BASO%: 0.1 % (ref 0.0–2.0)
EOS ABS: 0 10*3/uL (ref 0.0–0.5)
EOS%: 0 % (ref 0.0–7.0)
HCT: 32.4 % — ABNORMAL LOW (ref 34.8–46.6)
HGB: 11.2 g/dL — ABNORMAL LOW (ref 11.6–15.9)
LYMPH#: 2.4 10*3/uL (ref 0.9–3.3)
LYMPH%: 12.9 % — AB (ref 14.0–48.0)
MCH: 43.8 pg — AB (ref 26.0–34.0)
MCHC: 34.6 g/dL (ref 32.0–36.0)
MCV: 127 fL — AB (ref 81–101)
MONO#: 0.6 10*3/uL (ref 0.1–0.9)
MONO%: 2.9 % (ref 0.0–13.0)
NEUT#: 15.8 10*3/uL — ABNORMAL HIGH (ref 1.5–6.5)
NEUT%: 84.1 % — ABNORMAL HIGH (ref 39.6–80.0)
PLATELETS: 230 10*3/uL (ref 145–400)
RBC: 2.56 10*6/uL — AB (ref 3.70–5.32)
RDW: 15 % (ref 11.1–15.7)
WBC: 18.7 10*3/uL — ABNORMAL HIGH (ref 3.9–10.0)

## 2013-07-23 ENCOUNTER — Telehealth: Payer: Self-pay | Admitting: *Deleted

## 2013-07-23 NOTE — Telephone Encounter (Signed)
Left voicemail informing pt that WBC count is up from last visit, but Dr Marin Olp wants pt to continue taking Hydrea the way it was last prescribed

## 2013-08-03 ENCOUNTER — Encounter: Payer: Self-pay | Admitting: Hematology & Oncology

## 2013-08-03 ENCOUNTER — Ambulatory Visit (HOSPITAL_BASED_OUTPATIENT_CLINIC_OR_DEPARTMENT_OTHER): Payer: BC Managed Care – PPO | Admitting: Hematology & Oncology

## 2013-08-03 ENCOUNTER — Other Ambulatory Visit (HOSPITAL_BASED_OUTPATIENT_CLINIC_OR_DEPARTMENT_OTHER): Payer: BC Managed Care – PPO | Admitting: Lab

## 2013-08-03 VITALS — BP 132/57 | HR 79 | Temp 97.9°F | Resp 14 | Ht 66.0 in | Wt 242.0 lb

## 2013-08-03 DIAGNOSIS — D696 Thrombocytopenia, unspecified: Secondary | ICD-10-CM

## 2013-08-03 DIAGNOSIS — D649 Anemia, unspecified: Secondary | ICD-10-CM

## 2013-08-03 DIAGNOSIS — M7989 Other specified soft tissue disorders: Secondary | ICD-10-CM

## 2013-08-03 DIAGNOSIS — F411 Generalized anxiety disorder: Secondary | ICD-10-CM

## 2013-08-03 DIAGNOSIS — D471 Chronic myeloproliferative disease: Secondary | ICD-10-CM

## 2013-08-03 DIAGNOSIS — D72829 Elevated white blood cell count, unspecified: Secondary | ICD-10-CM

## 2013-08-03 DIAGNOSIS — D72819 Decreased white blood cell count, unspecified: Secondary | ICD-10-CM

## 2013-08-03 LAB — CBC WITH DIFFERENTIAL (CANCER CENTER ONLY)
BASO#: 0 10*3/uL (ref 0.0–0.2)
BASO%: 0 % (ref 0.0–2.0)
EOS%: 0.4 % (ref 0.0–7.0)
Eosinophils Absolute: 0 10*3/uL (ref 0.0–0.5)
HCT: 27.5 % — ABNORMAL LOW (ref 34.8–46.6)
HGB: 9.5 g/dL — ABNORMAL LOW (ref 11.6–15.9)
LYMPH#: 1.9 10*3/uL (ref 0.9–3.3)
LYMPH%: 19.5 % (ref 14.0–48.0)
MCH: 44 pg — ABNORMAL HIGH (ref 26.0–34.0)
MCHC: 34.5 g/dL (ref 32.0–36.0)
MCV: 127 fL — AB (ref 81–101)
MONO#: 0.2 10*3/uL (ref 0.1–0.9)
MONO%: 2.4 % (ref 0.0–13.0)
NEUT#: 7.5 10*3/uL — ABNORMAL HIGH (ref 1.5–6.5)
NEUT%: 77.7 % (ref 39.6–80.0)
PLATELETS: 91 10*3/uL — AB (ref 145–400)
RBC: 2.16 10*6/uL — ABNORMAL LOW (ref 3.70–5.32)
RDW: 13.4 % (ref 11.1–15.7)
WBC: 9.6 10*3/uL (ref 3.9–10.0)

## 2013-08-03 LAB — URIC ACID: Uric Acid, Serum: 4.5 mg/dL (ref 2.4–7.0)

## 2013-08-03 LAB — LACTATE DEHYDROGENASE: LDH: 149 U/L (ref 94–250)

## 2013-08-03 LAB — CHCC SATELLITE - SMEAR

## 2013-08-05 NOTE — Progress Notes (Signed)
Hematology and Oncology Follow Up Visit  Brianna Fletcher [id number] 1971/01/04 43 y.o. 08/05/2013   Principle Diagnosis:  Chronic myeloproliferative syndrome-JAK2 negative  Current Therapy:   Hydrea 1000 mg by mouth 3 times a day     Interim History:  Ms.  Brianna Fletcher is back for followup. Unfortunately, she's had a very bad 2 weeks. She really is focusing on her white cell count. We call her in a couple weeks ago stating that the white cell count was not going down. It was only 18,000. However, she really got incredibly worried over this. She does worries nonstop about her blood issue. I tried to reassure her. We have done as much testing as I can think of right now. We sent her out to [facility]. They cannot find anything different than what we have done. Her life really is being dictated by her anxiety over her blood issue. Again, I have tried to reassure her as much as I can.  She still has occasional pruritus. Am still not sure if the itching is i from her blood problem.  [name] had recurrent right hip issues. We have done x-rays. Unfortunately I am not sure if she sees any other doctor. She really just comes to see [place]. I am unsure if other doctors would be able to handle all of her issues.  She is trying to work. She still working. She want to cut back her hours a little bit.  [name] had no rashes. [name] had no abdominal pain. She has occasional abdominal issues. Again, I just don't believe this is anything related to her blood.  [name] had no fever. [name] had no sweats.  Medications: Current outpatient prescriptions:doxepin (SINEQUAN) 50 MG capsule, TAKE 1 CAPSULE BY MOUTH AT BEDTIME, Disp: 30 capsule, Rfl: 3;  hydroxyurea (HYDREA) 500 MG capsule, Take 3 capsule 2 times a day with food., Disp: 180 capsule, Rfl: 4;  prochlorperazine (COMPAZINE) 10 MG tablet, Take 1 pill 30 min before Hydrea, if needed, for nausea, Disp: 30 tablet, Rfl: 0  Allergies: No Known Allergies  Past Medical History,  Surgical history, Social history, and Family History were reviewed and updated.  Review of Systems: As above  Physical Exam:  height is 5\' 6"  (1.676 m) and weight is 242 lb (109.77 kg). Her oral temperature is 97.9 F (36.6 C). Her blood pressure is 132/57 and her pulse is 79. Her respiration is 14.   Incredibly anxious obese white female. Head and neck exam shows no ocular or oral lesions. There are no palpable cervical or supraclavicular lymph nodes. Lungs are clear. Cardiac exam regular in rhythm with no murmurs rubs or bruits. Abdomen is soft. She is obese. There is no fluid wave. There is no palpable liver or spleen tip. Back exam no tenderness over the spine ribs or hips. Extremities shows no clubbing cyanosis or edema. She has good range of motion of her joints. Has good strength. Shows no joint erythema or warmth. Skin exam no rashes. Neurological exam is nonfocal.  Lab Results  Component Value Date   WBC 9.6 08/03/2013   HGB 9.5* 08/03/2013   HCT 27.5* 08/03/2013   MCV 127* 08/03/2013   PLT 91* 08/03/2013     Chemistry      Component Value Date/Time   NA 139 06/20/2013 1415   NA 137 03/09/2013 1535   K 3.7 06/20/2013 1415   K 3.7 03/09/2013 1535   CL 99 06/20/2013 1415   CL 104 03/09/2013 1535   CO2 30  06/20/2013 1415   CO2 26 03/09/2013 1535   BUN 8 06/20/2013 1415   BUN 8 03/09/2013 1535   CREATININE 0.5* 06/20/2013 1415   CREATININE 0.57 03/09/2013 1535      Component Value Date/Time   CALCIUM 9.7 06/20/2013 1415   CALCIUM 10.0 03/09/2013 1535   ALKPHOS 112* 06/20/2013 1415   ALKPHOS 120* 03/09/2013 1535   AST 22 06/20/2013 1415   AST 9 03/09/2013 1535   ALT 19 06/20/2013 1415   ALT <8 03/09/2013 1535   BILITOT 1.00 06/20/2013 1415   BILITOT 0.5 03/09/2013 1535     I would've her blood smear. Physical mattress and for white blood cells. There may be a few hypersegmented polys. I see no immature myeloid cells. There's no nucleated red cells. Platelets  are somewhat decreased in  number.    Impression and Plan: [name] is a 43 year old white female. She has this chronic leuko-cytosis. Again we have done bone marrow saw her. We have done all the genetic tests that I can figure out to do. So far everything has been negative.  She has probably over responded now to be Hydrea. I told her to alternate between 6 pills a day and 4 pills a day. I told her we had to watch out for the anemia and thrombocytopenia. She does have some slight swelling in her lower legs which I think is probably from her anemia.  I just tried to reassure her. I suspect we may end up having to refer her for another opinion regarding the etiology of this leukocytosis. We may consider another bone marrow test on her.  I spent a good 45 minutes with her. I just listened to her vent her frustrations. I again tried to reassure her.  We will continue to follow her blood work every week.  I'll see her back in another 3 or 4 weeks.  [name], MD 5/17/20152:33 PM

## 2013-08-06 ENCOUNTER — Telehealth: Payer: Self-pay | Admitting: Hematology & Oncology

## 2013-08-06 LAB — FERRITIN CHCC: Ferritin: 297 ng/ml — ABNORMAL HIGH (ref 9–269)

## 2013-08-06 LAB — IRON AND TIBC CHCC
%SAT: 73 % — ABNORMAL HIGH (ref 21–57)
IRON: 175 ug/dL — AB (ref 41–142)
TIBC: 240 ug/dL (ref 236–444)
UIBC: 65 ug/dL — ABNORMAL LOW (ref 120–384)

## 2013-08-06 NOTE — Telephone Encounter (Signed)
Left pt message with 5-22 appointment time and that I have weekly appointments for her and to call for details.

## 2013-08-10 ENCOUNTER — Other Ambulatory Visit (HOSPITAL_BASED_OUTPATIENT_CLINIC_OR_DEPARTMENT_OTHER): Payer: BC Managed Care – PPO | Admitting: Lab

## 2013-08-10 DIAGNOSIS — D72819 Decreased white blood cell count, unspecified: Secondary | ICD-10-CM

## 2013-08-10 DIAGNOSIS — D696 Thrombocytopenia, unspecified: Secondary | ICD-10-CM

## 2013-08-10 DIAGNOSIS — D649 Anemia, unspecified: Secondary | ICD-10-CM

## 2013-08-10 DIAGNOSIS — D72829 Elevated white blood cell count, unspecified: Secondary | ICD-10-CM

## 2013-08-10 LAB — CBC WITH DIFFERENTIAL (CANCER CENTER ONLY)
BASO#: 0 10*3/uL (ref 0.0–0.2)
BASO%: 0.2 % (ref 0.0–2.0)
EOS%: 0.6 % (ref 0.0–7.0)
Eosinophils Absolute: 0.1 10*3/uL (ref 0.0–0.5)
HEMATOCRIT: 28.2 % — AB (ref 34.8–46.6)
HGB: 9.8 g/dL — ABNORMAL LOW (ref 11.6–15.9)
LYMPH#: 2.6 10*3/uL (ref 0.9–3.3)
LYMPH%: 24.8 % (ref 14.0–48.0)
MCH: 45.2 pg — ABNORMAL HIGH (ref 26.0–34.0)
MCHC: 34.8 g/dL (ref 32.0–36.0)
MCV: 130 fL — AB (ref 81–101)
MONO#: 0.6 10*3/uL (ref 0.1–0.9)
MONO%: 5.7 % (ref 0.0–13.0)
NEUT#: 7.1 10*3/uL — ABNORMAL HIGH (ref 1.5–6.5)
NEUT%: 68.7 % (ref 39.6–80.0)
Platelets: 198 10*3/uL (ref 145–400)
RBC: 2.17 10*6/uL — ABNORMAL LOW (ref 3.70–5.32)
RDW: 14.2 % (ref 11.1–15.7)
WBC: 10.3 10*3/uL — ABNORMAL HIGH (ref 3.9–10.0)

## 2013-08-17 ENCOUNTER — Other Ambulatory Visit (HOSPITAL_BASED_OUTPATIENT_CLINIC_OR_DEPARTMENT_OTHER): Payer: BC Managed Care – PPO | Admitting: Lab

## 2013-08-17 ENCOUNTER — Other Ambulatory Visit: Payer: BC Managed Care – PPO | Admitting: Lab

## 2013-08-17 DIAGNOSIS — D72819 Decreased white blood cell count, unspecified: Secondary | ICD-10-CM

## 2013-08-17 DIAGNOSIS — D47Z9 Other specified neoplasms of uncertain behavior of lymphoid, hematopoietic and related tissue: Secondary | ICD-10-CM

## 2013-08-17 LAB — CBC WITH DIFFERENTIAL (CANCER CENTER ONLY)
BASO#: 0 10*3/uL (ref 0.0–0.2)
BASO%: 0.1 % (ref 0.0–2.0)
EOS%: 0.2 % (ref 0.0–7.0)
Eosinophils Absolute: 0 10*3/uL (ref 0.0–0.5)
HCT: 29.4 % — ABNORMAL LOW (ref 34.8–46.6)
HGB: 10.2 g/dL — ABNORMAL LOW (ref 11.6–15.9)
LYMPH#: 3.3 10*3/uL (ref 0.9–3.3)
LYMPH%: 15.1 % (ref 14.0–48.0)
MCH: 45.3 pg — ABNORMAL HIGH (ref 26.0–34.0)
MCHC: 34.7 g/dL (ref 32.0–36.0)
MCV: 131 fL — ABNORMAL HIGH (ref 81–101)
MONO#: 1 10*3/uL — ABNORMAL HIGH (ref 0.1–0.9)
MONO%: 4.6 % (ref 0.0–13.0)
NEUT#: 17.7 10*3/uL — ABNORMAL HIGH (ref 1.5–6.5)
NEUT%: 80 % (ref 39.6–80.0)
PLATELETS: 330 10*3/uL (ref 145–400)
RBC: 2.25 10*6/uL — ABNORMAL LOW (ref 3.70–5.32)
RDW: 14.6 % (ref 11.1–15.7)
WBC: 22.2 10*3/uL — ABNORMAL HIGH (ref 3.9–10.0)

## 2013-08-17 LAB — CHCC SATELLITE - SMEAR

## 2013-08-21 ENCOUNTER — Telehealth: Payer: Self-pay | Admitting: *Deleted

## 2013-08-21 NOTE — Telephone Encounter (Signed)
Informed pt that Dr Marin Olp wants her to take 6 Hydrea pills a day. Informed pt to call the office back to let us know she understands these instructions.

## 2013-08-22 ENCOUNTER — Encounter: Payer: Self-pay | Admitting: *Deleted

## 2013-08-22 NOTE — Progress Notes (Signed)
Pt called back and confirmed that she understands Hydrea directions.

## 2013-08-24 ENCOUNTER — Other Ambulatory Visit (HOSPITAL_BASED_OUTPATIENT_CLINIC_OR_DEPARTMENT_OTHER): Payer: BC Managed Care – PPO | Admitting: Lab

## 2013-08-24 ENCOUNTER — Other Ambulatory Visit: Payer: Self-pay | Admitting: Hematology & Oncology

## 2013-08-24 DIAGNOSIS — D72819 Decreased white blood cell count, unspecified: Secondary | ICD-10-CM

## 2013-08-24 DIAGNOSIS — D47Z9 Other specified neoplasms of uncertain behavior of lymphoid, hematopoietic and related tissue: Secondary | ICD-10-CM

## 2013-08-24 LAB — CBC WITH DIFFERENTIAL (CANCER CENTER ONLY)
BASO#: 0 10*3/uL (ref 0.0–0.2)
BASO%: 0.1 % (ref 0.0–2.0)
EOS ABS: 0 10*3/uL (ref 0.0–0.5)
EOS%: 0 % (ref 0.0–7.0)
HCT: 30 % — ABNORMAL LOW (ref 34.8–46.6)
HGB: 10.4 g/dL — ABNORMAL LOW (ref 11.6–15.9)
LYMPH#: 2.4 10*3/uL (ref 0.9–3.3)
LYMPH%: 10.4 % — ABNORMAL LOW (ref 14.0–48.0)
MCH: 45.2 pg — AB (ref 26.0–34.0)
MCHC: 34.7 g/dL (ref 32.0–36.0)
MCV: 130 fL — ABNORMAL HIGH (ref 81–101)
MONO#: 0.8 10*3/uL (ref 0.1–0.9)
MONO%: 3.2 % (ref 0.0–13.0)
NEUT%: 86.3 % — ABNORMAL HIGH (ref 39.6–80.0)
NEUTROS ABS: 20.1 10*3/uL — AB (ref 1.5–6.5)
PLATELETS: 288 10*3/uL (ref 145–400)
RBC: 2.3 10*6/uL — ABNORMAL LOW (ref 3.70–5.32)
RDW: 14 % (ref 11.1–15.7)
WBC: 23.3 10*3/uL — AB (ref 3.9–10.0)

## 2013-08-31 ENCOUNTER — Ambulatory Visit: Payer: BC Managed Care – PPO | Admitting: Hematology & Oncology

## 2013-08-31 ENCOUNTER — Other Ambulatory Visit: Payer: BC Managed Care – PPO | Admitting: Lab

## 2013-09-07 ENCOUNTER — Telehealth: Payer: Self-pay | Admitting: Hematology & Oncology

## 2013-09-07 ENCOUNTER — Other Ambulatory Visit (HOSPITAL_BASED_OUTPATIENT_CLINIC_OR_DEPARTMENT_OTHER): Payer: BC Managed Care – PPO | Admitting: Lab

## 2013-09-07 DIAGNOSIS — D72819 Decreased white blood cell count, unspecified: Secondary | ICD-10-CM

## 2013-09-07 LAB — CBC WITH DIFFERENTIAL (CANCER CENTER ONLY)
BASO#: 0 10*3/uL (ref 0.0–0.2)
BASO%: 0.2 % (ref 0.0–2.0)
EOS ABS: 0 10*3/uL (ref 0.0–0.5)
EOS%: 0.1 % (ref 0.0–7.0)
HCT: 28.5 % — ABNORMAL LOW (ref 34.8–46.6)
HGB: 9.9 g/dL — ABNORMAL LOW (ref 11.6–15.9)
LYMPH#: 2.5 10*3/uL (ref 0.9–3.3)
LYMPH%: 14.8 % (ref 14.0–48.0)
MCH: 45.6 pg — ABNORMAL HIGH (ref 26.0–34.0)
MCHC: 34.7 g/dL (ref 32.0–36.0)
MCV: 131 fL — AB (ref 81–101)
MONO#: 0.4 10*3/uL (ref 0.1–0.9)
MONO%: 2.7 % (ref 0.0–13.0)
NEUT#: 13.6 10*3/uL — ABNORMAL HIGH (ref 1.5–6.5)
NEUT%: 82.2 % — ABNORMAL HIGH (ref 39.6–80.0)
PLATELETS: 128 10*3/uL — AB (ref 145–400)
RBC: 2.17 10*6/uL — AB (ref 3.70–5.32)
RDW: 13.1 % (ref 11.1–15.7)
WBC: 16.5 10*3/uL — AB (ref 3.9–10.0)

## 2013-09-07 LAB — CHCC SATELLITE - SMEAR

## 2013-09-07 NOTE — Telephone Encounter (Signed)
Patient cx 09/14/13 apt and resch for 09/13/13

## 2013-09-13 ENCOUNTER — Other Ambulatory Visit: Payer: BC Managed Care – PPO | Admitting: Lab

## 2013-09-13 ENCOUNTER — Other Ambulatory Visit (HOSPITAL_BASED_OUTPATIENT_CLINIC_OR_DEPARTMENT_OTHER): Payer: BC Managed Care – PPO | Admitting: Lab

## 2013-09-13 DIAGNOSIS — I7411 Embolism and thrombosis of thoracic aorta: Secondary | ICD-10-CM

## 2013-09-13 DIAGNOSIS — D72819 Decreased white blood cell count, unspecified: Secondary | ICD-10-CM

## 2013-09-13 DIAGNOSIS — D509 Iron deficiency anemia, unspecified: Secondary | ICD-10-CM

## 2013-09-13 LAB — CBC WITH DIFFERENTIAL (CANCER CENTER ONLY)
BASO#: 0 10*3/uL (ref 0.0–0.2)
BASO%: 0.2 % (ref 0.0–2.0)
EOS%: 0.2 % (ref 0.0–7.0)
Eosinophils Absolute: 0 10*3/uL (ref 0.0–0.5)
HCT: 26.5 % — ABNORMAL LOW (ref 34.8–46.6)
HGB: 9.1 g/dL — ABNORMAL LOW (ref 11.6–15.9)
LYMPH#: 1.9 10*3/uL (ref 0.9–3.3)
LYMPH%: 17.5 % (ref 14.0–48.0)
MCH: 45.5 pg — ABNORMAL HIGH (ref 26.0–34.0)
MCHC: 34.3 g/dL (ref 32.0–36.0)
MCV: 133 fL — ABNORMAL HIGH (ref 81–101)
MONO#: 0.3 10*3/uL (ref 0.1–0.9)
MONO%: 2.8 % (ref 0.0–13.0)
NEUT#: 8.4 10*3/uL — ABNORMAL HIGH (ref 1.5–6.5)
NEUT%: 79.3 % (ref 39.6–80.0)
Platelets: 96 10*3/uL — ABNORMAL LOW (ref 145–400)
RBC: 2 10*6/uL — ABNORMAL LOW (ref 3.70–5.32)
RDW: 12.8 % (ref 11.1–15.7)
WBC: 10.6 10*3/uL — ABNORMAL HIGH (ref 3.9–10.0)

## 2013-09-13 LAB — COMPREHENSIVE METABOLIC PANEL
AST: 10 U/L (ref 0–37)
Albumin: 4.2 g/dL (ref 3.5–5.2)
Alkaline Phosphatase: 113 U/L (ref 39–117)
BUN: 13 mg/dL (ref 6–23)
CALCIUM: 9.8 mg/dL (ref 8.4–10.5)
CHLORIDE: 104 meq/L (ref 96–112)
CO2: 24 mEq/L (ref 19–32)
Creatinine, Ser: 0.55 mg/dL (ref 0.50–1.10)
Glucose, Bld: 91 mg/dL (ref 70–99)
Potassium: 4.1 mEq/L (ref 3.5–5.3)
SODIUM: 140 meq/L (ref 135–145)
TOTAL PROTEIN: 6.9 g/dL (ref 6.0–8.3)
Total Bilirubin: 0.7 mg/dL (ref 0.2–1.2)

## 2013-09-13 LAB — LACTATE DEHYDROGENASE: LDH: 130 U/L (ref 94–250)

## 2013-09-13 LAB — LITHIUM LEVEL

## 2013-09-13 LAB — CHCC SATELLITE - SMEAR

## 2013-09-14 ENCOUNTER — Other Ambulatory Visit: Payer: BC Managed Care – PPO | Admitting: Lab

## 2013-09-18 ENCOUNTER — Encounter: Payer: Self-pay | Admitting: Hematology & Oncology

## 2013-09-18 ENCOUNTER — Other Ambulatory Visit (HOSPITAL_BASED_OUTPATIENT_CLINIC_OR_DEPARTMENT_OTHER): Payer: BC Managed Care – PPO | Admitting: Lab

## 2013-09-18 ENCOUNTER — Ambulatory Visit (HOSPITAL_BASED_OUTPATIENT_CLINIC_OR_DEPARTMENT_OTHER): Payer: BC Managed Care – PPO | Admitting: Hematology & Oncology

## 2013-09-18 VITALS — BP 135/63 | HR 74 | Temp 98.3°F | Resp 14 | Ht 66.0 in | Wt 239.0 lb

## 2013-09-18 DIAGNOSIS — D47Z9 Other specified neoplasms of uncertain behavior of lymphoid, hematopoietic and related tissue: Secondary | ICD-10-CM

## 2013-09-18 DIAGNOSIS — D72819 Decreased white blood cell count, unspecified: Secondary | ICD-10-CM

## 2013-09-18 DIAGNOSIS — D471 Chronic myeloproliferative disease: Secondary | ICD-10-CM

## 2013-09-18 LAB — CBC WITH DIFFERENTIAL (CANCER CENTER ONLY)
BASO#: 0 10*3/uL (ref 0.0–0.2)
BASO%: 0.1 % (ref 0.0–2.0)
EOS%: 0.3 % (ref 0.0–7.0)
Eosinophils Absolute: 0 10*3/uL (ref 0.0–0.5)
HCT: 25.4 % — ABNORMAL LOW (ref 34.8–46.6)
HGB: 8.7 g/dL — ABNORMAL LOW (ref 11.6–15.9)
LYMPH#: 1.8 10*3/uL (ref 0.9–3.3)
LYMPH%: 16.4 % (ref 14.0–48.0)
MCH: 45.8 pg — ABNORMAL HIGH (ref 26.0–34.0)
MCHC: 34.3 g/dL (ref 32.0–36.0)
MCV: 134 fL — ABNORMAL HIGH (ref 81–101)
MONO#: 0.3 10*3/uL (ref 0.1–0.9)
MONO%: 3 % (ref 0.0–13.0)
NEUT#: 8.7 10*3/uL — ABNORMAL HIGH (ref 1.5–6.5)
NEUT%: 80.2 % — ABNORMAL HIGH (ref 39.6–80.0)
PLATELETS: 122 10*3/uL — AB (ref 145–400)
RBC: 1.9 10*6/uL — ABNORMAL LOW (ref 3.70–5.32)
RDW: 13 % (ref 11.1–15.7)
WBC: 10.8 10*3/uL — ABNORMAL HIGH (ref 3.9–10.0)

## 2013-09-18 LAB — CHCC SATELLITE - SMEAR

## 2013-09-18 MED ORDER — FOLIC ACID 1 MG PO TABS
2.0000 mg | ORAL_TABLET | Freq: Every day | ORAL | Status: DC
Start: 2013-09-18 — End: 2013-11-16

## 2013-09-19 NOTE — Progress Notes (Signed)
Hematology and Oncology Follow Up Visit  [name] [id number] Feb 19, 1971 43 y.o. 09/19/2013   Principle Diagnosis:  Chronic myeloproliferative syndrome-JAK2 negativ  Current Therapy:    Hydrea 1000 mg by mouth 3 times a day     Interim History:  Ms.  Brianna Fletcher is back for followup. Brianna Fletcher is looking pretty good. She does feel tired. She is still working. It's a little more difficult for her to work full days because of fatigue.. [name] had a decent appetite. [name] had no nausea vomiting. [name] not noted any problems with cough. [name] had no shortness of breath. On occasion, she still has some occasional bouts of pruritus. There's been no obvious rash. [name] had no diarrhea. [name] had no leg swelling. [name] had no headache. There is no visual issues. Medications: Current outpatient prescriptions:doxepin (SINEQUAN) 50 MG capsule, TAKE 1 CAPSULE BY MOUTH AT BEDTIME, Disp: 30 capsule, Rfl: 3;  hydroxyurea (HYDREA) 500 MG capsule, Take 3 capsule 2 times a day with food., Disp: 180 capsule, Rfl: 4;  prochlorperazine (COMPAZINE) 10 MG tablet, Take 1 pill 30 min before Hydrea, if needed, for nausea, Disp: 30 tablet, Rfl: 0 folic acid (FOLVITE) 1 MG tablet, Take 2 tablets (2 mg total) by mouth daily., Disp: 60 tablet, Rfl: 6  Allergies: No Known Allergies  Past Medical History, Surgical history, Social history, and Family History were reviewed and updated.  Review of Systems: As above  Physical Exam:  height is 5\' 6"  (1.676 m) and weight is 239 lb (108.41 kg). Her oral temperature is 98.3 F (36.8 C). Her blood pressure is 135/63 and her pulse is 74. Her respiration is 14.   Well-developed well-nourished white female. Head and neck exam shows no ocular or oral lesions. There are no palpable cervical or supraclavicular lymph nodes. Lungs are clear. Cardiac exam. Rhythm. Abdomen soft. Has good bowel sounds. There is no fluid wave. There is no palpable liver or spleen tip. Back exam shows no  tenderness over the spine ribs or hips. Extremities shows no clubbing cyanosis or edema. Neurological exam shows no focal neurological deficits. Skin exam no rashes ecchymoses or petechia.  Lab Results  Component Value Date   WBC 10.8* 09/18/2013   HGB 8.7* 09/18/2013   HCT 25.4* 09/18/2013   MCV 134* 09/18/2013   PLT 122* 09/18/2013     Chemistry      Component Value Date/Time   NA 140 09/13/2013 1000   NA 139 06/20/2013 1415   K 4.1 09/13/2013 1000   K 3.7 06/20/2013 1415   CL 104 09/13/2013 1000   CL 99 06/20/2013 1415   CO2 24 09/13/2013 1000   CO2 30 06/20/2013 1415   BUN 13 09/13/2013 1000   BUN 8 06/20/2013 1415   CREATININE 0.55 09/13/2013 1000   CREATININE 0.5* 06/20/2013 1415      Component Value Date/Time   CALCIUM 9.8 09/13/2013 1000   CALCIUM 9.7 06/20/2013 1415   ALKPHOS 113 09/13/2013 1000   ALKPHOS 112* 06/20/2013 1415   AST 10 09/13/2013 1000   AST 22 06/20/2013 1415   ALT <8 09/13/2013 1000   ALT 19 06/20/2013 1415   BILITOT 0.7 09/13/2013 1000   BILITOT 1.00 06/20/2013 1415         Impression and Plan: Brianna Fletcher is 73 real female with a non-descriptive chronic myeloproliferative disorder. We've done genetic testing. She is negative for all the genetic mutations for chronic proliferative issues. However, her bone marrow is consistent with this.  I  thought about long-acting interferon. This would be an option for [place].  Her hemoglobin is dropping. I have poor about this with respect to the Hydrea and bone marrow suppression.  I think we have to adjust Hydrea dose to 1000 mg 3 times a day for 2 days alternating with 1000 mg twice a day.  On Mr. Wynonia Lawman RY cell count is improved parrotlike as holding pretty steady. Again as her hemoglobin at that we have to watch out for.  We have to have her come back weekly for a lab work. Protocol plan to see her back in another 3 or 4 weeks.  I spent a good 40 minutes with her today.   [name], MD 7/1/20155:33 PM

## 2013-09-28 ENCOUNTER — Other Ambulatory Visit (HOSPITAL_BASED_OUTPATIENT_CLINIC_OR_DEPARTMENT_OTHER): Payer: BC Managed Care – PPO | Admitting: Lab

## 2013-09-28 DIAGNOSIS — D471 Chronic myeloproliferative disease: Secondary | ICD-10-CM

## 2013-09-28 DIAGNOSIS — D47Z9 Other specified neoplasms of uncertain behavior of lymphoid, hematopoietic and related tissue: Secondary | ICD-10-CM

## 2013-09-28 LAB — CBC WITH DIFFERENTIAL (CANCER CENTER ONLY)
BASO#: 0 10*3/uL (ref 0.0–0.2)
BASO%: 0.2 % (ref 0.0–2.0)
EOS ABS: 0 10*3/uL (ref 0.0–0.5)
EOS%: 0.1 % (ref 0.0–7.0)
HCT: 29 % — ABNORMAL LOW (ref 34.8–46.6)
HEMOGLOBIN: 10.1 g/dL — AB (ref 11.6–15.9)
LYMPH#: 2.3 10*3/uL (ref 0.9–3.3)
LYMPH%: 15.8 % (ref 14.0–48.0)
MCH: 47 pg — ABNORMAL HIGH (ref 26.0–34.0)
MCHC: 34.8 g/dL (ref 32.0–36.0)
MCV: 135 fL — ABNORMAL HIGH (ref 81–101)
MONO#: 0.4 10*3/uL (ref 0.1–0.9)
MONO%: 3 % (ref 0.0–13.0)
NEUT#: 11.9 10*3/uL — ABNORMAL HIGH (ref 1.5–6.5)
NEUT%: 80.9 % — ABNORMAL HIGH (ref 39.6–80.0)
PLATELETS: 303 10*3/uL (ref 145–400)
RBC: 2.15 10*6/uL — AB (ref 3.70–5.32)
RDW: 13.8 % (ref 11.1–15.7)
WBC: 14.7 10*3/uL — ABNORMAL HIGH (ref 3.9–10.0)

## 2013-10-01 ENCOUNTER — Telehealth: Payer: Self-pay | Admitting: *Deleted

## 2013-10-01 NOTE — Telephone Encounter (Addendum)
Message copied by Lenn Sink on Mon Oct 01, 2013 12:01 PM ------      Message from: [name]      Created: Fri Sep 28, 2013 10:47 PM       Call - blood count looks a little better.  No change in Hydrea.  pete ------Left voicemail informing pt that blood count looks better and there will be no change in how she is taking Hydrea.

## 2013-10-05 ENCOUNTER — Other Ambulatory Visit (HOSPITAL_BASED_OUTPATIENT_CLINIC_OR_DEPARTMENT_OTHER): Payer: BC Managed Care – PPO | Admitting: Lab

## 2013-10-05 DIAGNOSIS — D47Z9 Other specified neoplasms of uncertain behavior of lymphoid, hematopoietic and related tissue: Secondary | ICD-10-CM

## 2013-10-05 DIAGNOSIS — D471 Chronic myeloproliferative disease: Secondary | ICD-10-CM

## 2013-10-05 LAB — CBC WITH DIFFERENTIAL (CANCER CENTER ONLY)
BASO#: 0 10*3/uL (ref 0.0–0.2)
BASO%: 0.2 % (ref 0.0–2.0)
EOS ABS: 0 10*3/uL (ref 0.0–0.5)
EOS%: 0 % (ref 0.0–7.0)
HCT: 27.5 % — ABNORMAL LOW (ref 34.8–46.6)
HEMOGLOBIN: 9.5 g/dL — AB (ref 11.6–15.9)
LYMPH#: 2.9 10*3/uL (ref 0.9–3.3)
LYMPH%: 14.4 % (ref 14.0–48.0)
MCH: 46.8 pg — AB (ref 26.0–34.0)
MCHC: 34.5 g/dL (ref 32.0–36.0)
MCV: 136 fL — ABNORMAL HIGH (ref 81–101)
MONO#: 0.6 10*3/uL (ref 0.1–0.9)
MONO%: 3 % (ref 0.0–13.0)
NEUT#: 16.4 10*3/uL — ABNORMAL HIGH (ref 1.5–6.5)
NEUT%: 82.4 % — ABNORMAL HIGH (ref 39.6–80.0)
Platelets: 233 10*3/uL (ref 145–400)
RBC: 2.03 10*6/uL — ABNORMAL LOW (ref 3.70–5.32)
RDW: 13.5 % (ref 11.1–15.7)
WBC: 19.9 10*3/uL — AB (ref 3.9–10.0)

## 2013-10-12 ENCOUNTER — Other Ambulatory Visit (HOSPITAL_BASED_OUTPATIENT_CLINIC_OR_DEPARTMENT_OTHER): Payer: BC Managed Care – PPO | Admitting: Lab

## 2013-10-12 ENCOUNTER — Other Ambulatory Visit: Payer: BC Managed Care – PPO | Admitting: Lab

## 2013-10-12 DIAGNOSIS — D47Z9 Other specified neoplasms of uncertain behavior of lymphoid, hematopoietic and related tissue: Secondary | ICD-10-CM

## 2013-10-12 DIAGNOSIS — D471 Chronic myeloproliferative disease: Secondary | ICD-10-CM

## 2013-10-12 LAB — CBC WITH DIFFERENTIAL (CANCER CENTER ONLY)
BASO#: 0 10*3/uL (ref 0.0–0.2)
BASO%: 0.1 % (ref 0.0–2.0)
EOS ABS: 0 10*3/uL (ref 0.0–0.5)
EOS%: 0.1 % (ref 0.0–7.0)
HEMATOCRIT: 25 % — AB (ref 34.8–46.6)
HEMOGLOBIN: 8.6 g/dL — AB (ref 11.6–15.9)
LYMPH#: 2.2 10*3/uL (ref 0.9–3.3)
LYMPH%: 17.1 % (ref 14.0–48.0)
MCH: 46.7 pg — AB (ref 26.0–34.0)
MCHC: 34.4 g/dL (ref 32.0–36.0)
MCV: 136 fL — AB (ref 81–101)
MONO#: 0.5 10*3/uL (ref 0.1–0.9)
MONO%: 3.6 % (ref 0.0–13.0)
NEUT#: 10.2 10*3/uL — ABNORMAL HIGH (ref 1.5–6.5)
NEUT%: 79.1 % (ref 39.6–80.0)
Platelets: 80 10*3/uL — ABNORMAL LOW (ref 145–400)
RBC: 1.84 10*6/uL — AB (ref 3.70–5.32)
RDW: 12.9 % (ref 11.1–15.7)
WBC: 12.9 10*3/uL — AB (ref 3.9–10.0)

## 2013-10-18 ENCOUNTER — Other Ambulatory Visit (HOSPITAL_BASED_OUTPATIENT_CLINIC_OR_DEPARTMENT_OTHER): Payer: BC Managed Care – PPO | Admitting: Lab

## 2013-10-18 DIAGNOSIS — D47Z9 Other specified neoplasms of uncertain behavior of lymphoid, hematopoietic and related tissue: Secondary | ICD-10-CM

## 2013-10-18 DIAGNOSIS — D471 Chronic myeloproliferative disease: Secondary | ICD-10-CM

## 2013-10-18 LAB — CBC WITH DIFFERENTIAL (CANCER CENTER ONLY)
BASO#: 0 10*3/uL (ref 0.0–0.2)
BASO%: 0.1 % (ref 0.0–2.0)
EOS%: 0.2 % (ref 0.0–7.0)
Eosinophils Absolute: 0 10*3/uL (ref 0.0–0.5)
HCT: 20.1 % — ABNORMAL LOW (ref 34.8–46.6)
HGB: 7 g/dL — ABNORMAL LOW (ref 11.6–15.9)
LYMPH#: 2.2 10*3/uL (ref 0.9–3.3)
LYMPH%: 22.5 % (ref 14.0–48.0)
MCH: 47 pg — AB (ref 26.0–34.0)
MCHC: 34.8 g/dL (ref 32.0–36.0)
MCV: 135 fL — AB (ref 81–101)
MONO#: 0.2 10*3/uL (ref 0.1–0.9)
MONO%: 2.2 % (ref 0.0–13.0)
NEUT#: 7.2 10*3/uL — ABNORMAL HIGH (ref 1.5–6.5)
NEUT%: 75 % (ref 39.6–80.0)
PLATELETS: 39 10*3/uL — AB (ref 145–400)
RBC: 1.49 10*6/uL — ABNORMAL LOW (ref 3.70–5.32)
RDW: 12.4 % (ref 11.1–15.7)
WBC: 9.6 10*3/uL (ref 3.9–10.0)

## 2013-10-19 ENCOUNTER — Other Ambulatory Visit: Payer: BC Managed Care – PPO | Admitting: Lab

## 2013-10-26 ENCOUNTER — Other Ambulatory Visit (HOSPITAL_BASED_OUTPATIENT_CLINIC_OR_DEPARTMENT_OTHER): Payer: BC Managed Care – PPO | Admitting: Lab

## 2013-10-26 DIAGNOSIS — D471 Chronic myeloproliferative disease: Secondary | ICD-10-CM

## 2013-10-26 DIAGNOSIS — D500 Iron deficiency anemia secondary to blood loss (chronic): Secondary | ICD-10-CM

## 2013-10-26 LAB — CBC WITH DIFFERENTIAL (CANCER CENTER ONLY)
BASO#: 0 10*3/uL (ref 0.0–0.2)
BASO%: 0.3 % (ref 0.0–2.0)
EOS%: 0.4 % (ref 0.0–7.0)
Eosinophils Absolute: 0.1 10*3/uL (ref 0.0–0.5)
HCT: 19.8 % — ABNORMAL LOW (ref 34.8–46.6)
HGB: 6.8 g/dL — CL (ref 11.6–15.9)
LYMPH#: 4 10*3/uL — AB (ref 0.9–3.3)
LYMPH%: 30.5 % (ref 14.0–48.0)
MCH: 46.9 pg — AB (ref 26.0–34.0)
MCHC: 34.3 g/dL (ref 32.0–36.0)
MCV: 137 fL — ABNORMAL HIGH (ref 81–101)
MONO#: 1.2 10*3/uL — ABNORMAL HIGH (ref 0.1–0.9)
MONO%: 9.3 % (ref 0.0–13.0)
NEUT%: 59.5 % (ref 39.6–80.0)
NEUTROS ABS: 7.8 10*3/uL — AB (ref 1.5–6.5)
PLATELETS: 138 10*3/uL — AB (ref 145–400)
RBC: 1.45 10*6/uL — ABNORMAL LOW (ref 3.70–5.32)
RDW: 13.3 % (ref 11.1–15.7)
WBC: 13.1 10*3/uL — ABNORMAL HIGH (ref 3.9–10.0)

## 2013-10-26 LAB — TECHNOLOGIST REVIEW CHCC SATELLITE

## 2013-10-30 ENCOUNTER — Ambulatory Visit (HOSPITAL_BASED_OUTPATIENT_CLINIC_OR_DEPARTMENT_OTHER): Payer: BC Managed Care – PPO | Admitting: Hematology & Oncology

## 2013-10-30 ENCOUNTER — Other Ambulatory Visit: Payer: Self-pay | Admitting: Hematology & Oncology

## 2013-10-30 ENCOUNTER — Other Ambulatory Visit (HOSPITAL_BASED_OUTPATIENT_CLINIC_OR_DEPARTMENT_OTHER): Payer: BC Managed Care – PPO | Admitting: Lab

## 2013-10-30 VITALS — BP 119/66 | HR 87 | Temp 97.7°F | Resp 20 | Ht 67.0 in | Wt 236.0 lb

## 2013-10-30 DIAGNOSIS — D471 Chronic myeloproliferative disease: Secondary | ICD-10-CM

## 2013-10-30 DIAGNOSIS — D47Z9 Other specified neoplasms of uncertain behavior of lymphoid, hematopoietic and related tissue: Secondary | ICD-10-CM

## 2013-10-30 DIAGNOSIS — D500 Iron deficiency anemia secondary to blood loss (chronic): Secondary | ICD-10-CM

## 2013-10-30 LAB — CBC WITH DIFFERENTIAL (CANCER CENTER ONLY)
BASO#: 0.1 10*3/uL (ref 0.0–0.2)
BASO%: 0.2 % (ref 0.0–2.0)
EOS%: 0.2 % (ref 0.0–7.0)
Eosinophils Absolute: 0.1 10*3/uL (ref 0.0–0.5)
HEMATOCRIT: 22.5 % — AB (ref 34.8–46.6)
HGB: 7.5 g/dL — ABNORMAL LOW (ref 11.6–15.9)
LYMPH#: 4.7 10*3/uL — ABNORMAL HIGH (ref 0.9–3.3)
LYMPH%: 15.2 % (ref 14.0–48.0)
MCH: 45.5 pg — ABNORMAL HIGH (ref 26.0–34.0)
MCHC: 33.3 g/dL (ref 32.0–36.0)
MCV: 136 fL — ABNORMAL HIGH (ref 81–101)
MONO#: 1.5 10*3/uL — ABNORMAL HIGH (ref 0.1–0.9)
MONO%: 4.8 % (ref 0.0–13.0)
NEUT#: 24.7 10*3/uL — ABNORMAL HIGH (ref 1.5–6.5)
NEUT%: 79.6 % (ref 39.6–80.0)
PLATELETS: 236 10*3/uL (ref 145–400)
RBC: 1.65 10*6/uL — ABNORMAL LOW (ref 3.70–5.32)
RDW: 13.2 % (ref 11.1–15.7)
WBC: 31 10*3/uL — AB (ref 3.9–10.0)

## 2013-10-30 LAB — IRON AND TIBC
%SAT: 44 % (ref 20–55)
Iron: 135 ug/dL (ref 42–145)
TIBC: 308 ug/dL (ref 250–470)
UIBC: 173 ug/dL (ref 125–400)

## 2013-10-30 LAB — HOLD TUBE, BLOOD BANK - CHCC SATELLITE

## 2013-10-30 LAB — FERRITIN: Ferritin: 364 ng/mL — ABNORMAL HIGH (ref 10–291)

## 2013-10-30 LAB — TECHNOLOGIST REVIEW CHCC SATELLITE

## 2013-10-31 ENCOUNTER — Encounter: Payer: Self-pay | Admitting: *Deleted

## 2013-10-31 NOTE — Progress Notes (Signed)
Hematology and Oncology Follow Up Visit  Brianna Fletcher [id number] 1970-08-05 43 y.o. 10/31/2013   Principle Diagnosis:  Chronic myeloproliferative syndrome-JAK2 negative  Current Therapy:    Hydrea 1000 mg by mouth 3 times a day-on hold     Interim History:  Brianna Fletcher is back for followup. We had to stop the Hydrea because she was becoming quite anemic. She had laboratory done last week and her hemoglobin was down to 6.8. On July 30, the hemoglobin was 7. Platelet count was 39,000. Her white cell count was 9.6. I thought that we can stop the Hydrea to let her blood counts recover. Last week, her platelet count was of 138,000. Her white cell count was 13.1.  She knows when her white cell count goes up because she begins to it. She is having more pruritus. She is having more fatigue. She is having a hard time working full-time. She may have to cut back and a part-time or maybe even go on disability until we can get her blood counts strained out and get her on the right dose of medication.  So far, we've not been able to uncover the etiology of his chronic myeloproliferative issue. Test so far of all been negative. I am going to send off an MPL515 genetic assay.  We may have to consider stopping the Hydrea. Again, she comes quite anemic and she just cannot function.  I am going to see if we cannot get pegylated interferon. This is the once a week dose interferon. This is approved for chronic myeloid leukemia. I think we could probably get it covered because she just cannot tolerate Hydrea.  [name] had some scattered bruises. [name] had no arthropathy. [name] had no diarrhea. She has had a cough. She has had fatigue. She, at some days, just has a heart on getting out of bed.  We did check her iron studies today. Her ferritin was 364 with an iron saturation 44%.  [name] had no palpable lymph glands. [name] had occasional headaches. [name] had no bleeding.  Overall, her performance status is ECOG  1.     Medications: Current outpatient prescriptions:doxepin (SINEQUAN) 50 MG capsule, TAKE 1 CAPSULE BY MOUTH AT BEDTIME, Disp: 30 capsule, Rfl: 3;  folic acid (FOLVITE) 1 MG tablet, Take 2 tablets (2 mg total) by mouth daily., Disp: 60 tablet, Rfl: 6;  hydroxyurea (HYDREA) 500 MG capsule, Take 3 capsule 2 times a day with food., Disp: 180 capsule, Rfl: 4 prochlorperazine (COMPAZINE) 10 MG tablet, Take 1 pill 30 min before Hydrea, if needed, for nausea, Disp: 30 tablet, Rfl: 0  Allergies: No Known Allergies  Past Medical History, Surgical history, Social history, and Family History were reviewed and updated.  Review of Systems: As above  Physical Exam:  height is 5\' 7"  (1.702 m) and weight is 236 lb (107.049 kg). Her oral temperature is 97.7 F (36.5 C). Her blood pressure is 119/66 and her pulse is 87. Her respiration is 20.   Well-developed and well-nourished white female. Head and neck exam shows no ocular or oral lesions. She has no palpable cervical or supraclavicular lymph nodes. Lungs are clear bilaterally. Cardiac exam regular in rhythm with a normal S1 and S2. There are no murmurs rubs or bruits. Abdomen is soft. She has good bowel sounds. There is no fluid wave. There is no palpable liver or spleen tip. Back exam no tenderness over the spine ribs or hips. Extremities shows no clubbing cyanosis or edema. Skin exam shows  no rashes, ecchymosis or petechia. Neurological exam shows no focal neurological deficits.  Lab Results  Component Value Date   WBC 31.0* 10/30/2013   HGB 7.5* 10/30/2013   HCT 22.5* 10/30/2013   MCV 136* 10/30/2013   PLT 236 10/30/2013     Chemistry      Component Value Date/Time   NA 140 09/13/2013 1000   NA 139 06/20/2013 1415   K 4.1 09/13/2013 1000   K 3.7 06/20/2013 1415   CL 104 09/13/2013 1000   CL 99 06/20/2013 1415   CO2 24 09/13/2013 1000   CO2 30 06/20/2013 1415   BUN 13 09/13/2013 1000   BUN 8 06/20/2013 1415   CREATININE 0.55 09/13/2013 1000   CREATININE  0.5* 06/20/2013 1415      Component Value Date/Time   CALCIUM 9.8 09/13/2013 1000   CALCIUM 9.7 06/20/2013 1415   ALKPHOS 113 09/13/2013 1000   ALKPHOS 112* 06/20/2013 1415   AST 10 09/13/2013 1000   AST 22 06/20/2013 1415   ALT <8 09/13/2013 1000   ALT 19 06/20/2013 1415   BILITOT 0.7 09/13/2013 1000   BILITOT 1.00 06/20/2013 1415         Impression and Plan: Brianna Fletcher is 43 year old white female. She has this chronic low platelets syndrome. We have checked her for the various genetic mutations. So far, everything has been negative. We will try to check her for the MPL515 mutation.  Him one to see if we cannot get pegylated interferon. This is the weekly dose interferon. We'll see if this might be able to help her out and not cause too much marrow suppression. It might be to begin is approved but hopefully we can as she cannot tolerate Hydrea and there really is nothing else out there that we can utilize.  We will have to continue to check her blood counts every couple weeks.  We will try to get her back and see [place] in another few weeks. Hopefully, we can get the interferon covered.  We spent about  45 minutes with her today.  Brianna Napoleon, MD 8/12/20156:31 PM

## 2013-11-02 ENCOUNTER — Other Ambulatory Visit: Payer: BC Managed Care – PPO | Admitting: Lab

## 2013-11-02 ENCOUNTER — Other Ambulatory Visit (HOSPITAL_BASED_OUTPATIENT_CLINIC_OR_DEPARTMENT_OTHER): Payer: BC Managed Care – PPO | Admitting: Lab

## 2013-11-02 DIAGNOSIS — D47Z9 Other specified neoplasms of uncertain behavior of lymphoid, hematopoietic and related tissue: Secondary | ICD-10-CM

## 2013-11-02 DIAGNOSIS — D471 Chronic myeloproliferative disease: Secondary | ICD-10-CM

## 2013-11-02 LAB — CBC WITH DIFFERENTIAL (CANCER CENTER ONLY)
BASO#: 0.1 10*3/uL (ref 0.0–0.2)
BASO%: 0.2 % (ref 0.0–2.0)
EOS%: 0.1 % (ref 0.0–7.0)
Eosinophils Absolute: 0.1 10*3/uL (ref 0.0–0.5)
HCT: 22.7 % — ABNORMAL LOW (ref 34.8–46.6)
HGB: 7.5 g/dL — ABNORMAL LOW (ref 11.6–15.9)
LYMPH#: 4.6 10*3/uL — AB (ref 0.9–3.3)
LYMPH%: 12.6 % — AB (ref 14.0–48.0)
MCH: 44.6 pg — ABNORMAL HIGH (ref 26.0–34.0)
MCHC: 33 g/dL (ref 32.0–36.0)
MCV: 135 fL — ABNORMAL HIGH (ref 81–101)
MONO#: 1.3 10*3/uL — AB (ref 0.1–0.9)
MONO%: 3.6 % (ref 0.0–13.0)
NEUT#: 30.6 10*3/uL — ABNORMAL HIGH (ref 1.5–6.5)
NEUT%: 83.5 % — ABNORMAL HIGH (ref 39.6–80.0)
PLATELETS: 247 10*3/uL (ref 145–400)
RBC: 1.68 10*6/uL — ABNORMAL LOW (ref 3.70–5.32)
RDW: 13.2 % (ref 11.1–15.7)
WBC: 36.7 10*3/uL — AB (ref 3.9–10.0)

## 2013-11-02 LAB — TECHNOLOGIST REVIEW CHCC SATELLITE

## 2013-11-09 ENCOUNTER — Ambulatory Visit (HOSPITAL_BASED_OUTPATIENT_CLINIC_OR_DEPARTMENT_OTHER): Payer: BC Managed Care – PPO | Admitting: Lab

## 2013-11-09 DIAGNOSIS — D471 Chronic myeloproliferative disease: Secondary | ICD-10-CM

## 2013-11-09 DIAGNOSIS — D47Z9 Other specified neoplasms of uncertain behavior of lymphoid, hematopoietic and related tissue: Secondary | ICD-10-CM

## 2013-11-09 LAB — CBC WITH DIFFERENTIAL (CANCER CENTER ONLY)
BASO#: 0.1 10*3/uL (ref 0.0–0.2)
BASO%: 0.2 % (ref 0.0–2.0)
EOS%: 0 % (ref 0.0–7.0)
Eosinophils Absolute: 0 10*3/uL (ref 0.0–0.5)
HCT: 25.8 % — ABNORMAL LOW (ref 34.8–46.6)
HGB: 8.4 g/dL — ABNORMAL LOW (ref 11.6–15.9)
LYMPH#: 3 10*3/uL (ref 0.9–3.3)
LYMPH%: 12 % — ABNORMAL LOW (ref 14.0–48.0)
MCH: 44.4 pg — ABNORMAL HIGH (ref 26.0–34.0)
MCHC: 32.6 g/dL (ref 32.0–36.0)
MCV: 137 fL — ABNORMAL HIGH (ref 81–101)
MONO#: 0.9 10*3/uL (ref 0.1–0.9)
MONO%: 3.6 % (ref 0.0–13.0)
NEUT%: 84.2 % — ABNORMAL HIGH (ref 39.6–80.0)
NEUTROS ABS: 21.2 10*3/uL — AB (ref 1.5–6.5)
PLATELETS: 242 10*3/uL (ref 145–400)
RBC: 1.89 10*6/uL — ABNORMAL LOW (ref 3.70–5.32)
RDW: 14.9 % (ref 11.1–15.7)
WBC: 25.2 10*3/uL — AB (ref 3.9–10.0)

## 2013-11-15 ENCOUNTER — Telehealth: Payer: Self-pay | Admitting: Hematology & Oncology

## 2013-11-15 ENCOUNTER — Other Ambulatory Visit: Payer: Self-pay | Admitting: Radiology

## 2013-11-15 NOTE — Telephone Encounter (Signed)
Pt's FMLA papers complete and faxed by JoEllen to: [facility]  Fx: [phone]  Case Nbr: [place] SCANNED

## 2013-11-15 NOTE — Telephone Encounter (Signed)
Faxed Medical Records via fax today  to:  [place]  Ph: [phone] Fx: [phone]  Plan Nbr: [id number] Incident Nbr: [id number]  Medical  Records requested from 10/20/2013 to present   [place] SCANNED

## 2013-11-16 ENCOUNTER — Other Ambulatory Visit: Payer: Self-pay | Admitting: Radiology

## 2013-11-16 ENCOUNTER — Encounter (HOSPITAL_COMMUNITY): Payer: Self-pay | Admitting: Pharmacy Technician

## 2013-11-16 ENCOUNTER — Other Ambulatory Visit (HOSPITAL_BASED_OUTPATIENT_CLINIC_OR_DEPARTMENT_OTHER): Payer: BC Managed Care – PPO | Admitting: Lab

## 2013-11-16 DIAGNOSIS — D471 Chronic myeloproliferative disease: Secondary | ICD-10-CM

## 2013-11-16 DIAGNOSIS — D47Z9 Other specified neoplasms of uncertain behavior of lymphoid, hematopoietic and related tissue: Secondary | ICD-10-CM

## 2013-11-16 LAB — CBC WITH DIFFERENTIAL (CANCER CENTER ONLY)
BASO#: 0 10*3/uL (ref 0.0–0.2)
BASO%: 0.2 % (ref 0.0–2.0)
EOS%: 0 % (ref 0.0–7.0)
Eosinophils Absolute: 0 10*3/uL (ref 0.0–0.5)
HCT: 28.3 % — ABNORMAL LOW (ref 34.8–46.6)
HGB: 9.2 g/dL — ABNORMAL LOW (ref 11.6–15.9)
LYMPH#: 2.6 10*3/uL (ref 0.9–3.3)
LYMPH%: 11.9 % — AB (ref 14.0–48.0)
MCH: 44.2 pg — AB (ref 26.0–34.0)
MCHC: 32.5 g/dL (ref 32.0–36.0)
MCV: 136 fL — AB (ref 81–101)
MONO#: 1 10*3/uL — ABNORMAL HIGH (ref 0.1–0.9)
MONO%: 4.7 % (ref 0.0–13.0)
NEUT#: 17.9 10*3/uL — ABNORMAL HIGH (ref 1.5–6.5)
NEUT%: 83.2 % — ABNORMAL HIGH (ref 39.6–80.0)
Platelets: 263 10*3/uL (ref 145–400)
RBC: 2.08 10*6/uL — ABNORMAL LOW (ref 3.70–5.32)
RDW: 15.1 % (ref 11.1–15.7)
WBC: 21.5 10*3/uL — ABNORMAL HIGH (ref 3.9–10.0)

## 2013-11-19 ENCOUNTER — Ambulatory Visit (HOSPITAL_COMMUNITY)
Admission: RE | Admit: 2013-11-19 | Discharge: 2013-11-19 | Disposition: A | Discharge status: Home / Self Care | Payer: BC Managed Care – PPO | Source: Ambulatory Visit | Attending: Hematology & Oncology | Admitting: Hematology & Oncology

## 2013-11-19 ENCOUNTER — Encounter (HOSPITAL_COMMUNITY): Payer: Self-pay

## 2013-11-19 DIAGNOSIS — D47Z9 Other specified neoplasms of uncertain behavior of lymphoid, hematopoietic and related tissue: Secondary | ICD-10-CM | POA: Insufficient documentation

## 2013-11-19 DIAGNOSIS — D471 Chronic myeloproliferative disease: Secondary | ICD-10-CM

## 2013-11-19 LAB — CBC WITH DIFFERENTIAL/PLATELET
BASOS PCT: 0 % (ref 0–1)
Basophils Absolute: 0 10*3/uL (ref 0.0–0.1)
EOS PCT: 0 % (ref 0–5)
Eosinophils Absolute: 0 10*3/uL (ref 0.0–0.7)
HCT: 26 % — ABNORMAL LOW (ref 36.0–46.0)
HEMOGLOBIN: 8.4 g/dL — AB (ref 12.0–15.0)
LYMPHS ABS: 2.7 10*3/uL (ref 0.7–4.0)
Lymphocytes Relative: 16 % (ref 12–46)
MCH: 42 pg — ABNORMAL HIGH (ref 26.0–34.0)
MCHC: 32.3 g/dL (ref 30.0–36.0)
MCV: 130 fL — AB (ref 78.0–100.0)
MONO ABS: 0.7 10*3/uL (ref 0.1–1.0)
Monocytes Relative: 4 % (ref 3–12)
Neutro Abs: 13.3 10*3/uL — ABNORMAL HIGH (ref 1.7–7.7)
Neutrophils Relative %: 80 % — ABNORMAL HIGH (ref 43–77)
PLATELETS: 223 10*3/uL (ref 150–400)
RBC: 2 MIL/uL — AB (ref 3.87–5.11)
RDW: 15.1 % (ref 11.5–15.5)
WBC: 16.7 10*3/uL — ABNORMAL HIGH (ref 4.0–10.5)

## 2013-11-19 LAB — BONE MARROW EXAM

## 2013-11-19 LAB — PROTIME-INR
INR: 1 (ref 0.00–1.49)
Prothrombin Time: 13.2 seconds (ref 11.6–15.2)

## 2013-11-19 LAB — APTT: aPTT: 27 seconds (ref 24–37)

## 2013-11-19 MED ORDER — HYDROCODONE-ACETAMINOPHEN 5-325 MG PO TABS
1.0000 | ORAL_TABLET | ORAL | Status: DC | PRN
  Filled 2013-11-19: qty 2

## 2013-11-19 MED ORDER — MIDAZOLAM HCL 2 MG/2ML IJ SOLN
INTRAMUSCULAR | Status: AC
  Filled 2013-11-19: qty 6

## 2013-11-19 MED ORDER — SODIUM CHLORIDE 0.9 % IV SOLN: INTRAVENOUS | Status: DC

## 2013-11-19 MED ORDER — FENTANYL CITRATE 0.05 MG/ML IJ SOLN
INTRAMUSCULAR | Status: AC | PRN
  Administered 2013-11-19: 25 ug via INTRAVENOUS
  Administered 2013-11-19: 50 ug via INTRAVENOUS

## 2013-11-19 MED ORDER — FENTANYL CITRATE 0.05 MG/ML IJ SOLN
INTRAMUSCULAR | Status: AC
  Filled 2013-11-19: qty 6

## 2013-11-19 MED ORDER — MIDAZOLAM HCL 2 MG/2ML IJ SOLN
INTRAMUSCULAR | Status: AC | PRN
  Administered 2013-11-19: 1 mg via INTRAVENOUS
  Administered 2013-11-19: 0.5 mg via INTRAVENOUS

## 2013-11-19 NOTE — H&P (Signed)
Chief Complaint: "I'm here for another bone marrow biopsy" Referring Physician:Ennever HPI: Brianna Fletcher is an 43 y.o. female with chronic myeloproliferative syndrome-JAK2 negative and thrombocytopenia. She had BM bx last year and did well with it. She is scheduled again today for repeat CT guided BM bx. PMHx, meds, labs reviewed.   Past Medical History:  Past Medical History  Diagnosis Date  . Glaucoma   . Arthritis   . Leukocytosis, unspecified 11/15/2012  . Chronic neutrophilia 11/15/2012  . Pruritus 11/15/2012  . Monocytosis 11/15/2012  . Myeloproliferative disorder 03/08/2013    Past Surgical History:  Past Surgical History  Procedure Laterality Date  . Tubal ligation  2000  . Eye surgery      laser eye surgery    Family History: No family history on file.  Social History:  reports that she has never smoked. She has never used smokeless tobacco. She reports that she does not drink alcohol. Her drug history is not on file.  Allergies: No Known Allergies  Medications:   Medication List    ASK your doctor about these medications       doxepin 50 MG capsule  Commonly known as:  SINEQUAN  Take 50 mg by mouth at bedtime.     folic acid 1 MG tablet  Commonly known as:  FOLVITE  Take 2 mg by mouth daily.     hydroxyurea 500 MG capsule  Commonly known as:  HYDREA  Take 1,000 mg by mouth 2 (two) times daily. May take with food to minimize GI side effects.     MELATONIN PO  Take 1 tablet by mouth at bedtime as needed (FOR SLEEP).        Please HPI for pertinent positives, otherwise complete 10 system ROS negative.  Physical Exam: BP 136/63  Pulse 70  Temp(Src) 98.2 F (36.8 C) (Oral)  Resp 16  SpO2 98%  LMP 11/17/2013 There is no weight on file to calculate BMI.   General Appearance:  Alert, cooperative, no distress, appears stated age  Head:  Normocephalic, without obvious abnormality, atraumatic  ENT: Unremarkable  Neck: Supple, symmetrical, trachea  midline  Lungs:   Clear to auscultation bilaterally, no w/r/r, respirations unlabored without use of accessory muscles.  Chest Wall:  No tenderness or deformity  Heart:  Regular rate and rhythm, S1, S2 normal, no murmur, rub or gallop.  Neurologic: Normal affect, no gross deficits.   Results for orders placed during the hospital encounter of 11/19/13 (from the past 48 hour(s))  APTT     Status: None   Collection Time    11/19/13  9:10 AM      Result Value Ref Range   aPTT 27  24 - 37 seconds  PROTIME-INR     Status: None   Collection Time    11/19/13  9:10 AM      Result Value Ref Range   Prothrombin Time 13.2  11.6 - 15.2 seconds   INR 1.00  0.00 - 1.49   No results found.  Assessment/Plan Chronic myeloproliferative syndrome-JAK2 negative with thrombocytopenia For CT guided BM bx Discussed procedure, risks, complications, recovery, use of sedation Labs reviewed. Consent signed in chart   [name] PA-C 11/19/2013, 9:42 AM

## 2013-11-19 NOTE — Procedures (Signed)
CT guided bone marrow biopsy for chronic myeloproliferative syndrome.  2 aspirates and 1 adequate core obtained.  No immediate complication.

## 2013-11-19 NOTE — Discharge Instructions (Signed)
Bone Marrow Aspiration, Bone Marrow Biopsy °Care After °Read the instructions outlined below and refer to this sheet in the next few weeks. These discharge instructions provide you with general information on caring for yourself after you leave the hospital. Your caregiver may also give you specific instructions. While your treatment has been planned according to the most current medical practices available, unavoidable complications occasionally occur. If you have any problems or questions after discharge, call your caregiver. °FINDING OUT THE RESULTS OF YOUR TEST °Not all test results are available during your visit. If your test results are not back during the visit, make an appointment with your caregiver to find out the results. Do not assume everything is normal if you have not heard from your caregiver or the medical facility. It is important for you to follow up on all of your test results.  °HOME CARE INSTRUCTIONS  °You have had sedation and may be sleepy or dizzy. Your thinking may not be as clear as usual. For the next 24 hours: °· Only take over-the-counter or prescription medicines for pain, discomfort, and or fever as directed by your caregiver. °· Do not drink alcohol. °· Do not smoke. °· Do not drive. °· Do not make important legal decisions. °· Do not operate heavy machinery. °· Do not care for small children by yourself. °· Keep your dressing clean and dry. You may replace dressing with a bandage after 24 hours. °· You may take a bath or shower after 24 hours. °· Use an ice pack for 20 minutes every 2 hours while awake for pain as needed. °SEEK MEDICAL CARE IF:  °· There is redness, swelling, or increasing pain at the biopsy site. °· There is pus coming from the biopsy site. °· There is drainage from a biopsy site lasting longer than one day. °· An unexplained oral temperature above 102° F (38.9° C) develops. °SEEK IMMEDIATE MEDICAL CARE IF:  °· You develop a rash. °· You have difficulty  breathing. °· You develop any reaction or side effects to medications given. °Document Released: 09/25/2004 Document Revised: 05/31/2011 Document Reviewed: 03/05/2008 °ExitCare® Patient Information ©2015 [employer]. This information is not intended to replace advice given to you by your health care provider. Make sure you discuss any questions you have with your health care provider. °Conscious Sedation, Adult, Care After °Refer to this sheet in the next few weeks. These instructions provide you with information on caring for yourself after your procedure. Your health care provider may also give you more specific instructions. Your treatment has been planned according to current medical practices, but problems sometimes occur. Call your health care provider if you have any problems or questions after your procedure. °WHAT TO EXPECT AFTER THE PROCEDURE  °After your procedure: °· You may feel sleepy, clumsy, and have poor balance for several hours. °· Vomiting may occur if you eat too soon after the procedure. °HOME CARE INSTRUCTIONS °· Do not participate in any activities where you could become injured for at least 24 hours. Do not: °¨ Drive. °¨ Swim. °¨ Ride a bicycle. °¨ Operate heavy machinery. °¨ Cook. °¨ Use power tools. °¨ Climb ladders. °¨ Work from a high place. °· Do not make important decisions or sign legal documents until you are improved. °· If you vomit, drink water, juice, or soup when you can drink without vomiting. Make sure you have little or no nausea before eating solid foods. °· Only take over-the-counter or prescription medicines for pain, discomfort, or fever   as directed by your health care provider. °· Make sure you and your family fully understand everything about the medicines given to you, including what side effects may occur. °· You should not drink alcohol, take sleeping pills, or take medicines that cause drowsiness for at least 24 hours. °· If you smoke, do not smoke without  supervision. °· If you are feeling better, you may resume normal activities 24 hours after you were sedated. °· Keep all appointments with your health care provider. °SEEK MEDICAL CARE IF: °· Your skin is pale or bluish in color. °· You continue to feel nauseous or vomit. °· Your pain is getting worse and is not helped by medicine. °· You have bleeding or swelling. °· You are still sleepy or feeling clumsy after 24 hours. °SEEK IMMEDIATE MEDICAL CARE IF: °· You develop a rash. °· You have difficulty breathing. °· You develop any type of allergic problem. °· You have a fever. °MAKE SURE YOU: °· Understand these instructions. °· Will watch your condition. °· Will get help right away if you are not doing well or get worse. °Document Released: 12/27/2012 Document Reviewed: 12/27/2012 °ExitCare® Patient Information ©2015 [employer]. This information is not intended to replace advice given to you by your health care provider. Make sure you discuss any questions you have with your health care provider. ° °

## 2013-11-20 ENCOUNTER — Encounter: Payer: Self-pay | Admitting: Hematology & Oncology

## 2013-11-20 ENCOUNTER — Ambulatory Visit (HOSPITAL_BASED_OUTPATIENT_CLINIC_OR_DEPARTMENT_OTHER): Payer: BC Managed Care – PPO | Admitting: Hematology & Oncology

## 2013-11-20 ENCOUNTER — Other Ambulatory Visit (HOSPITAL_BASED_OUTPATIENT_CLINIC_OR_DEPARTMENT_OTHER): Payer: BC Managed Care – PPO | Admitting: Lab

## 2013-11-20 VITALS — BP 130/70 | HR 58 | Temp 98.0°F | Resp 14 | Ht 67.0 in | Wt 237.0 lb

## 2013-11-20 DIAGNOSIS — D47Z9 Other specified neoplasms of uncertain behavior of lymphoid, hematopoietic and related tissue: Secondary | ICD-10-CM

## 2013-11-20 DIAGNOSIS — D500 Iron deficiency anemia secondary to blood loss (chronic): Secondary | ICD-10-CM

## 2013-11-20 DIAGNOSIS — D471 Chronic myeloproliferative disease: Secondary | ICD-10-CM

## 2013-11-20 LAB — CBC WITH DIFFERENTIAL (CANCER CENTER ONLY)
BASO#: 0 10*3/uL (ref 0.0–0.2)
BASO%: 0.2 % (ref 0.0–2.0)
EOS ABS: 0 10*3/uL (ref 0.0–0.5)
EOS%: 0.2 % (ref 0.0–7.0)
HCT: 28.7 % — ABNORMAL LOW (ref 34.8–46.6)
HEMOGLOBIN: 9.4 g/dL — AB (ref 11.6–15.9)
LYMPH#: 2.3 10*3/uL (ref 0.9–3.3)
LYMPH%: 12.7 % — ABNORMAL LOW (ref 14.0–48.0)
MCH: 44.1 pg — ABNORMAL HIGH (ref 26.0–34.0)
MCHC: 32.8 g/dL (ref 32.0–36.0)
MCV: 135 fL — ABNORMAL HIGH (ref 81–101)
MONO#: 0.6 10*3/uL (ref 0.1–0.9)
MONO%: 3.6 % (ref 0.0–13.0)
NEUT%: 83.3 % — ABNORMAL HIGH (ref 39.6–80.0)
NEUTROS ABS: 14.8 10*3/uL — AB (ref 1.5–6.5)
Platelets: 249 10*3/uL (ref 145–400)
RBC: 2.13 10*6/uL — AB (ref 3.70–5.32)
RDW: 14.2 % (ref 11.1–15.7)
WBC: 17.8 10*3/uL — ABNORMAL HIGH (ref 3.9–10.0)

## 2013-11-20 LAB — IRON AND TIBC CHCC
%SAT: 28 % (ref 21–57)
IRON: 80 ug/dL (ref 41–142)
TIBC: 285 ug/dL (ref 236–444)
UIBC: 206 ug/dL (ref 120–384)

## 2013-11-20 LAB — COMPREHENSIVE METABOLIC PANEL
ALT: 9 U/L (ref 0–35)
AST: 13 U/L (ref 0–37)
Albumin: 4.3 g/dL (ref 3.5–5.2)
Alkaline Phosphatase: 123 U/L — ABNORMAL HIGH (ref 39–117)
BUN: 9 mg/dL (ref 6–23)
CALCIUM: 9.9 mg/dL (ref 8.4–10.5)
CHLORIDE: 102 meq/L (ref 96–112)
CO2: 23 mEq/L (ref 19–32)
Creatinine, Ser: 0.68 mg/dL (ref 0.50–1.10)
GLUCOSE: 93 mg/dL (ref 70–99)
POTASSIUM: 4.1 meq/L (ref 3.5–5.3)
Sodium: 134 mEq/L — ABNORMAL LOW (ref 135–145)
Total Bilirubin: 0.6 mg/dL (ref 0.2–1.2)
Total Protein: 6.9 g/dL (ref 6.0–8.3)

## 2013-11-20 LAB — LACTATE DEHYDROGENASE: LDH: 155 U/L (ref 94–250)

## 2013-11-20 LAB — RETICULOCYTES (CHCC)
ABS Retic: 116.1 10*3/uL (ref 19.0–186.0)
RBC.: 2.19 MIL/uL — ABNORMAL LOW (ref 3.87–5.11)
RETIC CT PCT: 5.3 % — AB (ref 0.4–2.3)

## 2013-11-20 LAB — CHCC SATELLITE - SMEAR

## 2013-11-20 LAB — FERRITIN CHCC: FERRITIN: 118 ng/mL (ref 9–269)

## 2013-11-21 ENCOUNTER — Ambulatory Visit (HOSPITAL_BASED_OUTPATIENT_CLINIC_OR_DEPARTMENT_OTHER)
Admission: RE | Admit: 2013-11-21 | Discharge: 2013-11-21 | Disposition: A | Discharge status: Home / Self Care | Payer: BC Managed Care – PPO | Source: Ambulatory Visit | Attending: Hematology & Oncology | Admitting: Hematology & Oncology

## 2013-11-21 DIAGNOSIS — K7689 Other specified diseases of liver: Secondary | ICD-10-CM | POA: Diagnosis not present

## 2013-11-21 DIAGNOSIS — K8020 Calculus of gallbladder without cholecystitis without obstruction: Secondary | ICD-10-CM | POA: Insufficient documentation

## 2013-11-21 DIAGNOSIS — D471 Chronic myeloproliferative disease: Secondary | ICD-10-CM

## 2013-11-21 DIAGNOSIS — D47Z9 Other specified neoplasms of uncertain behavior of lymphoid, hematopoietic and related tissue: Secondary | ICD-10-CM | POA: Insufficient documentation

## 2013-11-21 NOTE — Progress Notes (Signed)
Hematology and Oncology Follow Up Visit  Brianna Fletcher [id number] [birth year] 43 y.o. 11/21/2013   Principle Diagnosis:  Chronic myeloproliferative syndrome-JAK2 negative  Current Therapy:   Hydrea 1000 mg by mouth 2 times a day     Interim History:  [name] is back for followup. [name] doing a little better. She is profoundly Hydrea right now. We cut her dose back. She is a little bit more energetic. [name] not working. We have her out on disability right now.  I want to try to see about getting her on interferon. I will have to get the insurance coverage to approve this which might be difficult.  [name] not having too much in way of pruritus.. She had a repeat bone marrow test done yesterday. I want to make sure that nothing else is going on with her. Her last bone marrow test probably about a year ago.  [name] had no rashes. [name] had no fever. [name] had no abdominal pain. I am going to repeat an ultrasound of her abdomen to make sure that her spleen is not enlarged.  Did send off another genetic test on her. I sent off the [id number] assay. This was not mutated.  Overall, her performance status is ECOG 1. Medications: Current outpatient prescriptions:doxepin (SINEQUAN) 50 MG capsule, Take 50 mg by mouth at bedtime., Disp: , Rfl: ;  folic acid (FOLVITE) 1 MG tablet, Take 2 mg by mouth daily., Disp: , Rfl: ;  hydroxyurea (HYDREA) 500 MG capsule, Take 1,000 mg by mouth 2 (two) times daily. May take with food to minimize GI side effects., Disp: , Rfl: ;  MELATONIN PO, Take 1 tablet by mouth at bedtime as needed (FOR SLEEP)., Disp: , Rfl:   Allergies: No Known Allergies  Past Medical History, Surgical history, Social history, and Family History were reviewed and updated.  Review of Systems: As above  Physical Exam:  height is _0  (1.702 m) and weight is 237 lb (107.502 kg). Her oral temperature is 98 F (36.7 C). Her blood pressure is 130/70 and her pulse is 58. Her respiration is 14.    Well-developed and well-nourished white female. Head and neck exam shows no ocular or oral lesions. She has no palpable cervical or supraclavicular lymph nodes. Lungs are clear. Cardiac exam regular in rhythm with no murmurs rubs or bruits. Abdomen is soft. She has good bowel sounds. There is no fluid wave. There is no palpable liver or spleen tip. Extremities shows no clubbing, cyanosis or edema. Skin exam no rashes, ecchymosis or petechia. Neurological exam shows no focal neurological deficits.  Lab Results  Component Value Date   WBC 17.8* 11/20/2013   HGB 9.4* 11/20/2013   HCT 28.7* 11/20/2013   MCV 135* 11/20/2013   PLT 249 11/20/2013     Chemistry      Component Value Date/Time   NA 134* 11/20/2013 1116   NA 139 06/20/2013 1415   K 4.1 11/20/2013 1116   K 3.7 06/20/2013 1415   CL 102 11/20/2013 1116   CL 99 06/20/2013 1415   CO2 23 11/20/2013 1116   CO2 30 06/20/2013 1415   BUN 9 11/20/2013 1116   BUN 8 06/20/2013 1415   CREATININE 0.68 11/20/2013 1116   CREATININE 0.5* 06/20/2013 1415      Component Value Date/Time   CALCIUM 9.9 11/20/2013 1116   CALCIUM 9.7 06/20/2013 1415   ALKPHOS 123* 11/20/2013 1116   ALKPHOS 112* 06/20/2013 1415   AST 13 11/20/2013 1116  AST 22 06/20/2013 1415   ALT 9 11/20/2013 1116   ALT 19 06/20/2013 1415   BILITOT 0.6 11/20/2013 1116   BILITOT 1.00 06/20/2013 1415         Impression and Plan: Brianna Fletcher is 43 year old female. She is this myeloproliferative syndrome. So far, our workup has been totally negative. We will see what her repeat bone marrow biopsy shows.  Am going to try to get her on the PEG-interferon. This is weekly. I think this would be very reasonable. With the Hydrea, her blood counts really suffer. In 1 her blood counts go down, she really has a hard time. [name] out of work right now.  Hopefully, we can get the interferon and try to get started next week.  I looked at her blood smear. She has increased white cells. Again, I don't see any blasts. I really don't  see any immature myeloid cells. She is to hypersegmented polys. Platelets looked okay. Red cells or without nucleated red cells.  [name] schedule weekly for her lab work.  Again, we will try to get the interferon going next week.   [name], MD 9/2/20157:11 AM

## 2013-11-22 ENCOUNTER — Encounter: Payer: Self-pay | Admitting: *Deleted

## 2013-11-23 ENCOUNTER — Telehealth: Payer: Self-pay | Admitting: Hematology & Oncology

## 2013-11-23 NOTE — Telephone Encounter (Signed)
Left pt message moved 9-11 to [facility]

## 2013-11-27 LAB — CHROMOSOME ANALYSIS, BONE MARROW

## 2013-11-28 ENCOUNTER — Telehealth: Payer: Self-pay | Admitting: Hematology & Oncology

## 2013-11-28 NOTE — Telephone Encounter (Signed)
[employer] [place] Myeloproliferative disorder - Primary 238.79  [id number]  - PR PEG INTERFERON ALFA-2A/180  Efc: 03/22/2012  I spoke w Gerald Stabs on 11/28/2013 and he advised, this injection is covered under pts plan and npa is required.  However, pt does have oop she has not meet as of to date.   Ref: [id number]  P: [phone]

## 2013-11-30 ENCOUNTER — Other Ambulatory Visit: Payer: BC Managed Care – PPO | Admitting: Lab

## 2013-11-30 ENCOUNTER — Other Ambulatory Visit: Payer: BC Managed Care – PPO

## 2013-11-30 ENCOUNTER — Ambulatory Visit: Payer: BC Managed Care – PPO

## 2013-11-30 ENCOUNTER — Telehealth: Payer: Self-pay | Admitting: *Deleted

## 2013-11-30 ENCOUNTER — Encounter: Payer: Self-pay | Admitting: Hematology & Oncology

## 2013-11-30 NOTE — Telephone Encounter (Signed)
Per charge RN I have called and canceled appts for today. Per charge patient to have a [facility] office next week. I notified patient. [facility] office to call patient

## 2013-12-07 ENCOUNTER — Ambulatory Visit (HOSPITAL_BASED_OUTPATIENT_CLINIC_OR_DEPARTMENT_OTHER): Payer: BC Managed Care – PPO

## 2013-12-07 ENCOUNTER — Other Ambulatory Visit (HOSPITAL_BASED_OUTPATIENT_CLINIC_OR_DEPARTMENT_OTHER): Payer: BC Managed Care – PPO | Admitting: Lab

## 2013-12-07 ENCOUNTER — Other Ambulatory Visit: Payer: BC Managed Care – PPO | Admitting: Lab

## 2013-12-07 VITALS — BP 134/68 | HR 75 | Temp 98.3°F | Resp 20

## 2013-12-07 DIAGNOSIS — D471 Chronic myeloproliferative disease: Secondary | ICD-10-CM

## 2013-12-07 DIAGNOSIS — D72829 Elevated white blood cell count, unspecified: Secondary | ICD-10-CM

## 2013-12-07 DIAGNOSIS — D47Z9 Other specified neoplasms of uncertain behavior of lymphoid, hematopoietic and related tissue: Secondary | ICD-10-CM

## 2013-12-07 LAB — CBC WITH DIFFERENTIAL (CANCER CENTER ONLY)
BASO#: 0.1 10*3/uL (ref 0.0–0.2)
BASO%: 0.3 % (ref 0.0–2.0)
EOS%: 0.5 % (ref 0.0–7.0)
Eosinophils Absolute: 0.1 10*3/uL (ref 0.0–0.5)
HEMATOCRIT: 30.3 % — AB (ref 34.8–46.6)
HGB: 10 g/dL — ABNORMAL LOW (ref 11.6–15.9)
LYMPH#: 2.7 10*3/uL (ref 0.9–3.3)
LYMPH%: 12.1 % — ABNORMAL LOW (ref 14.0–48.0)
MCH: 43.3 pg — ABNORMAL HIGH (ref 26.0–34.0)
MCHC: 33 g/dL (ref 32.0–36.0)
MCV: 131 fL — ABNORMAL HIGH (ref 81–101)
MONO#: 1.2 10*3/uL — AB (ref 0.1–0.9)
MONO%: 5.5 % (ref 0.0–13.0)
NEUT#: 18 10*3/uL — ABNORMAL HIGH (ref 1.5–6.5)
NEUT%: 81.6 % — AB (ref 39.6–80.0)
Platelets: 260 10*3/uL (ref 145–400)
RBC: 2.31 10*6/uL — ABNORMAL LOW (ref 3.70–5.32)
RDW: 13.4 % (ref 11.1–15.7)
WBC: 22.1 10*3/uL — AB (ref 3.9–10.0)

## 2013-12-07 MED ORDER — PEGINTERFERON ALFA-2A 180 MCG/0.5ML ~~LOC~~ KIT
90.0000 ug | PACK | SUBCUTANEOUS | Status: DC
  Filled 2013-12-07: qty 0.5

## 2013-12-07 MED ORDER — PEGINTERFERON ALFA-2A 180 MCG/ML ~~LOC~~ SOLN
90.0000 ug | SUBCUTANEOUS | Status: DC
  Administered 2013-12-07: 90 ug via SUBCUTANEOUS
  Filled 2013-12-07: qty 0.5

## 2013-12-07 MED ORDER — IBUPROFEN 200 MG PO TABS
ORAL_TABLET | ORAL | Status: AC
  Filled 2013-12-07: qty 3

## 2013-12-07 MED ORDER — IBUPROFEN 200 MG PO TABS
600.0000 mg | ORAL_TABLET | ORAL | Status: DC
  Administered 2013-12-07: 600 mg via ORAL

## 2013-12-07 NOTE — Patient Instructions (Signed)
Peginterferon Alfa-2a Injection What is this medicine? PEGINTERFERON ALFA-2a (peg in ter FEER on AL fa 2 a) is a man-made drug that acts like a protein made by the body. It is used to treat chronic hepatitis B and C infections. This medicine may be used for other purposes; ask your health care provider or pharmacist if you have questions. COMMON BRAND NAME(S): Pegasys, Pegasys ProClick What should I tell my health care provider before I take this medicine? They need to know if you have any of these conditions: -alcoholism -auto-immune hepatitis -blood or bleeding disorders -colitis like ulcerative colitis or Crohn's disease -depression or other mental disorders -diabetes -drug abuse or addiction -heart disease -history of cancer -kidney disease -lupus -psoriasis -rheumatoid arthritis -thyroid problems -an unusual or allergic reaction to peginterferon, other medicines, foods, dyes, or preservatives like benzyl alcohol -pregnant or trying to get pregnant -breast-feeding How should I use this medicine? This medicine is for injection under the skin. Do NOT shake this medicine. You will be taught how to prepare and give this medicine. Use exactly as directed. Take your medicine at regular intervals. Do not take your medicine more often than directed. It is important that you put your used needles and syringes in a special sharps container. Do not put them in a trash can. If you do not have a sharps container, call your pharmacist or healthcare provider to get one. A special MedGuide will be given to you by the pharmacist with each prescription and refill. Be sure to read this information carefully each time. Talk to your pediatrician regarding the use of this medicine in children. While this drug may be prescribed for children as young as 5 years for selected conditions, precautions do apply. Overdosage: If you think you have taken too much of this medicine contact a poison control center or  emergency room at once. NOTE: This medicine is only for you. Do not share this medicine with others. What if I miss a dose? If you miss a dose and you remember within 2 days of when you missed your dose, take it as soon as you can. If it is late in the day, wait until the next day to take your dose. If more than 2 days have passed since you missed your dose ask your doctor what to do. Do not take double or extra doses. What may interact with this medicine? -alcohol -medicines for HIV like didanosine, lamivudine, stavudine, zidovudine -methadone -theophylline This list may not describe all possible interactions. Give your health care provider a list of all the medicines, herbs, non-prescription drugs, or dietary supplements you use. Also tell them if you smoke, drink alcohol, or use illegal drugs. Some items may interact with your medicine. What should I watch for while using this medicine? Visit your doctor or health care professional for regular checks on your progress. You will need regular blood checks. You may get drowsy or dizzy. Do not drive, use machinery, or do anything that needs mental alertness until you know how this medicine affects you. Do not stand or sit up quickly, especially if you are an older patient. This reduces the risk of dizzy or fainting spells. Alcohol may interfere with the effect of this medicine. Avoid alcoholic drinks. What side effects may I notice from receiving this medicine? Side effects that you should report to your doctor or health care professional as soon as possible: -allergic reactions like skin rash, itching or hives, swelling of the face, lips, or tongue -bloody   diarrhea -breathing problems -change in blood sugar -changes in vision -chest pain -confusion, trouble speaking or understanding -fast, irregular heartbeat -fever -high blood pressure -increased anger, depression, irritability, or thoughts of suicide -pain in lower back or stomach -pain,  tingling, numbness in the hands or feet -sudden numbness or weakness of the face, arm or leg -trouble passing urine or change in the amount of urine -trouble walking, dizziness, loss of balance or coordination -unusual bleeding or bruising Side effects that usually do not require medical attention (report to your doctor or health care professional if they continue or are bothersome): -aches, pains -dry, itchy skin -hair loss -loss of appetite -nausea, stomach upset -pain or swelling at site where injected -trouble sleeping -unusually weak or tired This list may not describe all possible side effects. Call your doctor for medical advice about side effects. You may report side effects to FDA at 1-800-FDA-1088. Where should I keep my medicine? Keep out of the reach of children. Store in a refrigerator between 2 and 8 degrees C (36 and 46 degrees F). Do not freeze. Protect from light. Do not leave this medicine out of the refrigerator for more than 24 hours. Throw away any unused medicine after the expiration date. NOTE: This sheet is a summary. It may not cover all possible information. If you have questions about this medicine, talk to your doctor, pharmacist, or health care provider.  2015, Elsevier/Gold Standard. (2009-11-13 17:27:34)  

## 2013-12-13 ENCOUNTER — Encounter: Payer: Self-pay | Admitting: Hematology & Oncology

## 2013-12-14 ENCOUNTER — Ambulatory Visit: Payer: BC Managed Care – PPO

## 2013-12-14 ENCOUNTER — Other Ambulatory Visit: Payer: BC Managed Care – PPO | Admitting: Lab

## 2013-12-14 ENCOUNTER — Encounter: Payer: Self-pay | Admitting: Family

## 2013-12-14 ENCOUNTER — Ambulatory Visit (HOSPITAL_BASED_OUTPATIENT_CLINIC_OR_DEPARTMENT_OTHER): Payer: BC Managed Care – PPO | Admitting: Family

## 2013-12-14 ENCOUNTER — Other Ambulatory Visit (HOSPITAL_BASED_OUTPATIENT_CLINIC_OR_DEPARTMENT_OTHER): Payer: BC Managed Care – PPO | Admitting: Lab

## 2013-12-14 ENCOUNTER — Telehealth: Payer: Self-pay | Admitting: Hematology & Oncology

## 2013-12-14 VITALS — BP 140/75 | HR 70 | Temp 97.9°F | Resp 14 | Ht 65.0 in | Wt 237.0 lb

## 2013-12-14 DIAGNOSIS — D47Z9 Other specified neoplasms of uncertain behavior of lymphoid, hematopoietic and related tissue: Secondary | ICD-10-CM

## 2013-12-14 DIAGNOSIS — D72829 Elevated white blood cell count, unspecified: Secondary | ICD-10-CM

## 2013-12-14 DIAGNOSIS — D471 Chronic myeloproliferative disease: Secondary | ICD-10-CM

## 2013-12-14 DIAGNOSIS — R5381 Other malaise: Secondary | ICD-10-CM

## 2013-12-14 DIAGNOSIS — R5383 Other fatigue: Secondary | ICD-10-CM

## 2013-12-14 LAB — CBC WITH DIFFERENTIAL (CANCER CENTER ONLY)
BASO#: 0 10*3/uL (ref 0.0–0.2)
BASO%: 0.2 % (ref 0.0–2.0)
EOS ABS: 0 10*3/uL (ref 0.0–0.5)
EOS%: 0.1 % (ref 0.0–7.0)
HEMATOCRIT: 32.8 % — AB (ref 34.8–46.6)
HEMOGLOBIN: 10.8 g/dL — AB (ref 11.6–15.9)
LYMPH#: 2.7 10*3/uL (ref 0.9–3.3)
LYMPH%: 12.9 % — ABNORMAL LOW (ref 14.0–48.0)
MCH: 42.4 pg — ABNORMAL HIGH (ref 26.0–34.0)
MCHC: 32.9 g/dL (ref 32.0–36.0)
MCV: 129 fL — AB (ref 81–101)
MONO#: 0.9 10*3/uL (ref 0.1–0.9)
MONO%: 4.2 % (ref 0.0–13.0)
NEUT#: 17 10*3/uL — ABNORMAL HIGH (ref 1.5–6.5)
NEUT%: 82.6 % — AB (ref 39.6–80.0)
Platelets: 207 10*3/uL (ref 145–400)
RBC: 2.55 10*6/uL — AB (ref 3.70–5.32)
RDW: 13.1 % (ref 11.1–15.7)
WBC: 20.6 10*3/uL — AB (ref 3.9–10.0)

## 2013-12-14 LAB — CMP (CANCER CENTER ONLY)
ALBUMIN: 3.7 g/dL (ref 3.3–5.5)
ALT(SGPT): 13 U/L (ref 10–47)
AST: 19 U/L (ref 11–38)
Alkaline Phosphatase: 140 U/L — ABNORMAL HIGH (ref 26–84)
BUN, Bld: 9 mg/dL (ref 7–22)
CALCIUM: 10 mg/dL (ref 8.0–10.3)
CHLORIDE: 99 meq/L (ref 98–108)
CO2: 25 mEq/L (ref 18–33)
Creat: 0.6 mg/dl (ref 0.6–1.2)
GLUCOSE: 153 mg/dL — AB (ref 73–118)
POTASSIUM: 3.2 meq/L — AB (ref 3.3–4.7)
Sodium: 139 mEq/L (ref 128–145)
TOTAL PROTEIN: 7.6 g/dL (ref 6.4–8.1)
Total Bilirubin: 0.6 mg/dl (ref 0.20–1.60)

## 2013-12-14 LAB — LACTATE DEHYDROGENASE: LDH: 132 U/L (ref 94–250)

## 2013-12-14 LAB — CHCC SATELLITE - SMEAR

## 2013-12-14 MED ORDER — PEGINTERFERON ALFA-2A 180 MCG/ML ~~LOC~~ SOLN
81.0000 ug | SUBCUTANEOUS | Status: DC
  Administered 2013-12-14: 81 ug via SUBCUTANEOUS
  Filled 2013-12-14: qty 0.45

## 2013-12-14 MED ORDER — IBUPROFEN 200 MG PO TABS
ORAL_TABLET | ORAL | Status: AC
  Filled 2013-12-14: qty 3

## 2013-12-14 MED ORDER — IBUPROFEN 200 MG PO TABS
600.0000 mg | ORAL_TABLET | Freq: Once | ORAL | Status: AC
  Administered 2013-12-14: 600 mg via ORAL

## 2013-12-14 NOTE — Patient Instructions (Signed)
Interferon Alfa-2a injection What is this medicine? INTERFERON ALFA-2a (in ter FEER on AL fa 2 a) (Roferon-A) helps the immune system fight viral infections such as chronic hepatitis C, and certain cancers like Philadelphia chromosome positive chronic myelogenous leukemia, and hairy cell leukemia. This medicine may be used for other purposes; ask your health care provider or pharmacist if you have questions. COMMON BRAND NAME(S): Roferon A What should I tell my health care provider before I take this medicine? They need to know if you have any of these conditions: -alcoholism or other drug abuse or addiction -autoimmune disease like psoriasis, Raynaud's phenomenon, rheumatoid arthritis, or systemic lupus erythematosus -blood or bleeding disorders -depression or mental disorders -diabetes -heart disease -high blood pressure -immune system problems like HIV infection -kidney disease -liver disease -lung disease -seizures -stomach problems -thyroid disease -transplant recipient -an unusual or allergic reaction to interferons, other medicines, benzyl alcohol, E. coli proteins, foods, dyes, or preservatives -pregnant or trying to get pregnant -breast-feeding How should I use this medicine? The medicine is for injection under the skin. Do not shake the prefilled syringes. Shaking can destroy the medicine. If you are giving yourself the injections, make sure you follow the directions carefully. If you have any questions about how to give your medicine, call your health care provider. If you experience flu-like effects, inject the dose at bedtime to decrease these effects. Do not reuse syringes or needles. It is important that you put your used needles and syringes in a special sharps container. Do not put them in a trash can. If you do not have a sharps container, call your pharmacist or healthcare provider to get one. A special MedGuide will be given to you by the pharmacist with each  prescription and refill. Be sure to read this information carefully each time. Talk to your pediatrician regarding the use of this medicine in children. Special care may be needed. Patients over 69 years old may have a stronger reaction and need a smaller dose. Overdosage: If you think you have taken too much of this medicine contact a poison control center or emergency room at once. NOTE: This medicine is only for you. Do not share this medicine with others. What if I miss a dose? If you miss a dose, take it as soon as you can. If it is almost time for your next dose, take only that dose. Do not take double or extra doses or take more than one dose in a day unless told to do so by your doctor. If you forget a dose for more than 2 days, call your health care professional. If you accidentally take too much, call your doctor immediately. What may interact with this medicine? -alcohol -antiviral medicines for HIV or AIDS -theophylline This list may not describe all possible interactions. Give your health care provider a list of all the medicines, herbs, non-prescription drugs, or dietary supplements you use. Also tell them if you smoke, drink alcohol, or use illegal drugs. Some items may interact with your medicine. What should I watch for while using this medicine? Visit your doctor or health care professional for regular checks on your progress. You will need regular blood checks. Do not change brands of medicine without consulting your doctor or health care professional. Different brands of medicine can act differently in your body. Check with your pharmacist if your refills do not look like your original product. You may get drowsy or dizzy. Do not drive, use machinery, or do anything that  needs mental alertness until you know how this medicine affects you. Alcohol can make you more drowsy or dizzy, increase confusion and lightheadedness. Avoid alcoholic drinks. This medicine can cause flu-like  symptoms and make you feel generally unwell. Report any side effects, but continue your medicine even though you feel ill, unless your doctor or health care professional tells you to stop. If you get a fever or sore throat that does not go away after the first few weeks of treatment, do not treat yourself. Call your doctor or health care professional as soon as you can if you think you have an infection. Other signs of infection include cough, lower back or side pain, and pain or difficulty passing urine. Do not become pregnant while taking this medicine. Women should inform their doctor if they wish to become pregnant or think they might be pregnant. There is a potential for serious side effects to an unborn child. Talk to your health care professional or pharmacist for more information. Do not breast-feed an infant while taking this medicine. This medicine can cause blood problems and may increase your risk to bruise or bleed. Call your doctor or health care professional if you notice any unusual bleeding. Be careful not to cut, bruise, or injure yourself because you may get an infection and bleed more than usual. Be careful brushing and flossing your teeth or using a toothpick while receiving this medicine because you may get an infection or bleed more easily. If you have any dental work done, tell your dentist you are receiving this medicine. What side effects may I notice from receiving this medicine? Side effects that you should report to your doctor or health care professional as soon as possible: -allergic reactions like skin rash, itching or hives, swelling of the face, lips, or tongue -breathing problems -changes in vision -chest pain or palpitations -depression -fast, irregular heartbeat -seizures -severe abdominal pain -signs of infection - fever or chills, cough, sore throat, pain or difficulty passing urine -unusual bleeding or bruising -unusually weak or tired -yellowing of the eyes  or skin Side effects that usually do not require medical attention (report to your doctor or health care professional if they continue or are bothersome): -hair loss -headaches -joint, leg, muscle, or back aches -nausea, vomiting -tiredness -weight loss This list may not describe all possible side effects. Call your doctor for medical advice about side effects. You may report side effects to FDA at 1-800-FDA-1088. Where should I keep my medicine? Keep out of the reach of children. Store in a refrigerator between 2 and 8 degrees C (36 and 46 degrees F). Do not freeze or expose to light. Throw away any unused medicine after the expiration date. NOTE: This sheet is a summary. It may not cover all possible information. If you have questions about this medicine, talk to your doctor, pharmacist, or health care provider.  2015, Elsevier/Gold Standard. (2007-07-25 16:04:33)  

## 2013-12-14 NOTE — Progress Notes (Signed)
[place]  [phone] [phone]) [phone]  ID: Brianna Fletcher OB: March 15, 1971 MR#: [id number] FHL#:[id number] Patient Care Team: Baruch Goldmann, PA-C as PCP - General (Physician Assistant)  DIAGNOSIS: Chronic myeloproliferative syndrome-JAK2 negative  INTERVAL HISTORY: Brianna Fletcher is here today for a follow-up. She is feeling very tired since her interferon injection. She states she want to sleep all the time. She out on disability right now and is looking forward to getting back to work. Her rash is better. She denies fever, chills,n/v, cough, rash, headache, dizziness, SOB, chest pain, palpitations, abdominal pain, constipation, diarrhea, blood in urine or stool. She has had no swelling, tenderness, numbness or tingling in her extremities. She has had no bleeding or pain. Her appetite is good and she is drinking plenty of fluids. Her only issue at this time seems to be fatigue.   CURRENT TREATMENT: PEG-Interferon injection weekly  REVIEW OF SYSTEMS: All other 10 point review of systems is negative except for those issues mentioned above.  PAST MEDICAL HISTORY: Past Medical History  Diagnosis Date  . Glaucoma   . Arthritis   . Leukocytosis, unspecified 11/15/2012  . Chronic neutrophilia 11/15/2012  . Pruritus 11/15/2012  . Monocytosis 11/15/2012  . Myeloproliferative disorder 03/08/2013   PAST SURGICAL HISTORY: Past Surgical History  Procedure Laterality Date  . Tubal ligation  2000  . Eye surgery      laser eye surgery   FAMILY HISTORY No family history on file.  GYNECOLOGIC HISTORY:  Patient's last menstrual period was 11/17/2013.   SOCIAL HISTORY:  History   Social History  . Marital Status: Divorced    Spouse Name: N/A    Number of Children: N/A  . Years of Education: N/A   Occupational History  . Not on file.   Social History Main Topics  . Smoking status: Never Smoker   . Smokeless tobacco: Never Used     Comment: never used tobacco  .  Alcohol Use: No  . Drug Use: Not on file  . Sexual Activity: Not on file   Other Topics Concern  . Not on file   Social History Narrative  . No narrative on file   ADVANCED DIRECTIVES: <no information>  HEALTH MAINTENANCE: History  Substance Use Topics  . Smoking status: Never Smoker   . Smokeless tobacco: Never Used     Comment: never used tobacco  . Alcohol Use: No   Colonoscopy: PAP: Bone density: Lipid panel:  No Known Allergies  Current Outpatient Prescriptions  Medication Sig Dispense Refill  . doxepin (SINEQUAN) 50 MG capsule Take 50 mg by mouth at bedtime.      . folic acid (FOLVITE) 1 MG tablet Take 2 mg by mouth daily.      . INTERFERON ALFA-2A [place] Inject into the skin once a week.       Marland Kitchen MELATONIN PO Take 1 tablet by mouth at bedtime as needed (FOR SLEEP).       No current facility-administered medications for this visit.   Facility-Administered Medications Ordered in Other Visits  Medication Dose Route Frequency Provider Last Rate Last Dose  . peginterferon alfa-2a (PEGASYS) injection 81 mcg  81 mcg Subcutaneous Weekly [name], MD   81 mcg at 12/14/13 1600   OBJECTIVE: Filed Vitals:   12/14/13 1458  BP: 140/75  Pulse: 70  Temp: 97.9 F (36.6 C)  Resp: 14   Body mass index is 39.44 kg/(m^2). ECOG FS:1 - Symptomatic but completely ambulatory Ocular: Sclerae unicteric, pupils  equal, round and reactive to light Ear-nose-throat: Oropharynx clear, dentition fair Lymphatic: No cervical or supraclavicular adenopathy Lungs no rales or rhonchi, good excursion bilaterally Heart regular rate and rhythm, no murmur appreciated Abd soft, nontender, positive bowel sounds MSK no focal spinal tenderness, no joint edema Neuro: non-focal, well-oriented, appropriate affect Breasts: Deferred  LAB RESULTS: CMP     Component Value Date/Time   NA 139 12/14/2013 1438   NA 134* 11/20/2013 1116   K 3.2* 12/14/2013 1438   K 4.1 11/20/2013 1116   CL 99 12/14/2013  1438   CL 102 11/20/2013 1116   CO2 25 12/14/2013 1438   CO2 23 11/20/2013 1116   GLUCOSE 153* 12/14/2013 1438   GLUCOSE 93 11/20/2013 1116   BUN 9 12/14/2013 1438   BUN 9 11/20/2013 1116   CREATININE 0.6 12/14/2013 1438   CREATININE 0.68 11/20/2013 1116   CALCIUM 10.0 12/14/2013 1438   CALCIUM 9.9 11/20/2013 1116   PROT 7.6 12/14/2013 1438   PROT 6.9 11/20/2013 1116   ALBUMIN 4.3 11/20/2013 1116   AST 19 12/14/2013 1438   AST 13 11/20/2013 1116   ALT 13 12/14/2013 1438   ALT 9 11/20/2013 1116   ALKPHOS 140* 12/14/2013 1438   ALKPHOS 123* 11/20/2013 1116   BILITOT 0.60 12/14/2013 1438   BILITOT 0.6 11/20/2013 1116   GFRNONAA >60 05/29/2010 1850   GFRAA  Value: >60        The eGFR has been calculated using the MDRD equation. This calculation has not been validated in all clinical situations. eGFR's persistently <60 mL/min signify possible Chronic Kidney Disease. 05/29/2010 1850   No results found for this basename: SPEP, UPEP,  kappa and lambda light chains   Lab Results  Component Value Date   WBC 20.6* 12/14/2013   NEUTROABS 17.0* 12/14/2013   HGB 10.8* 12/14/2013   HCT 32.8* 12/14/2013   MCV 129* 12/14/2013   PLT 207 12/14/2013   No results found for this basename: LABCA2   No components found with this basename: [id number]   No results found for this basename: INR,  in the last 168 hours  STUDIES: None  ASSESSMENT/PLAN: Brianna Fletcher is 43 year old female with myeloproliferative syndrome. She received her first interferon injection last week. She c/o fatigue and wanting to sleep all the time.  We will decrease her interferon dose today and see if that helps improve her fatigue.  Her CBC today was ok, we will continue to monitor her blood counts.  We will give her a new schedule today. We will continue with weekly injections.  We will see her in 3 weeks for a follow-up and labs.  She knows to cal here with any questions or concerns and to go to the ED in the event of an emergency. We can certainly see her sooner  if need be.   Eliezer Bottom, NP 12/14/2013 4:58 PM

## 2013-12-14 NOTE — Telephone Encounter (Signed)
Left pt message with 10-2,9 and 16th appointments

## 2013-12-17 ENCOUNTER — Telehealth: Payer: Self-pay | Admitting: *Deleted

## 2013-12-17 ENCOUNTER — Other Ambulatory Visit: Payer: Self-pay | Admitting: *Deleted

## 2013-12-17 DIAGNOSIS — D72829 Elevated white blood cell count, unspecified: Secondary | ICD-10-CM

## 2013-12-17 MED ORDER — POTASSIUM CHLORIDE CRYS ER 20 MEQ PO TBCR: 20.0000 meq | EXTENDED_RELEASE_TABLET | Freq: Every day | ORAL | Status: DC

## 2013-12-17 NOTE — Telephone Encounter (Addendum)
Message copied by Lenn Sink on Mon Dec 17, 2013  3:47 PM ------      Message from: Burney Gauze R      Created: Mon Dec 17, 2013  7:44 AM       calll - potassium is low!  Needs K-dur 34mEq a day.  Pete ------Left voicemail informing pt that potassium level is low and asked for her to call back to our office. Called pt back and left voicemail informing her that the prescription is at [facility]

## 2013-12-20 MED FILL — Peginterferon alfa-2a Inj Kit 180 MCG/0.5ML: SUBCUTANEOUS | Qty: 0.5 | Status: AC

## 2013-12-21 ENCOUNTER — Other Ambulatory Visit (HOSPITAL_BASED_OUTPATIENT_CLINIC_OR_DEPARTMENT_OTHER): Payer: BC Managed Care – PPO | Admitting: Lab

## 2013-12-21 ENCOUNTER — Ambulatory Visit (HOSPITAL_BASED_OUTPATIENT_CLINIC_OR_DEPARTMENT_OTHER): Payer: BC Managed Care – PPO

## 2013-12-21 VITALS — BP 123/72 | HR 76 | Temp 97.7°F | Resp 16

## 2013-12-21 DIAGNOSIS — D471 Chronic myeloproliferative disease: Secondary | ICD-10-CM

## 2013-12-21 DIAGNOSIS — D72829 Elevated white blood cell count, unspecified: Secondary | ICD-10-CM

## 2013-12-21 LAB — CBC WITH DIFFERENTIAL (CANCER CENTER ONLY)
BASO#: 0 10*3/uL (ref 0.0–0.2)
BASO%: 0.1 % (ref 0.0–2.0)
EOS%: 0.2 % (ref 0.0–7.0)
Eosinophils Absolute: 0.1 10*3/uL (ref 0.0–0.5)
HEMATOCRIT: 33 % — AB (ref 34.8–46.6)
HEMOGLOBIN: 10.8 g/dL — AB (ref 11.6–15.9)
LYMPH#: 2.6 10*3/uL (ref 0.9–3.3)
LYMPH%: 11.7 % — AB (ref 14.0–48.0)
MCH: 40.9 pg — ABNORMAL HIGH (ref 26.0–34.0)
MCHC: 32.7 g/dL (ref 32.0–36.0)
MCV: 125 fL — ABNORMAL HIGH (ref 81–101)
MONO#: 1.5 10*3/uL — AB (ref 0.1–0.9)
MONO%: 7.1 % (ref 0.0–13.0)
NEUT#: 17.6 10*3/uL — ABNORMAL HIGH (ref 1.5–6.5)
NEUT%: 80.9 % — AB (ref 39.6–80.0)
Platelets: 269 10*3/uL (ref 145–400)
RBC: 2.64 10*6/uL — ABNORMAL LOW (ref 3.70–5.32)
RDW: 13 % (ref 11.1–15.7)
WBC: 21.8 10*3/uL — ABNORMAL HIGH (ref 3.9–10.0)

## 2013-12-21 LAB — CMP (CANCER CENTER ONLY)
ALT(SGPT): 13 U/L (ref 10–47)
AST: 24 U/L (ref 11–38)
Albumin: 3.6 g/dL (ref 3.3–5.5)
Alkaline Phosphatase: 144 U/L — ABNORMAL HIGH (ref 26–84)
BILIRUBIN TOTAL: 0.5 mg/dL (ref 0.20–1.60)
BUN, Bld: 8 mg/dL (ref 7–22)
CO2: 25 meq/L (ref 18–33)
CREATININE: 0.7 mg/dL (ref 0.6–1.2)
Calcium: 10.1 mg/dL (ref 8.0–10.3)
Chloride: 104 mEq/L (ref 98–108)
Glucose, Bld: 110 mg/dL (ref 73–118)
Potassium: 3.9 mEq/L (ref 3.3–4.7)
SODIUM: 140 meq/L (ref 128–145)
TOTAL PROTEIN: 7.5 g/dL (ref 6.4–8.1)

## 2013-12-21 MED ORDER — PEGINTERFERON ALFA-2A 180 MCG/ML ~~LOC~~ SOLN
81.0000 ug | Freq: Once | SUBCUTANEOUS | Status: AC
  Administered 2013-12-21: 81 ug via SUBCUTANEOUS
  Filled 2013-12-21: qty 0.45

## 2013-12-21 MED ORDER — PEGINTERFERON ALFA-2A 180 MCG/0.5ML ~~LOC~~ KIT: 81.0000 ug | PACK | Freq: Once | SUBCUTANEOUS | Status: DC

## 2013-12-21 NOTE — Patient Instructions (Signed)
Interferon Alfa-2a injection What is this medicine? INTERFERON ALFA-2a (in ter FEER on AL fa 2 a) (Roferon-A) helps the immune system fight viral infections such as chronic hepatitis C, and certain cancers like Philadelphia chromosome positive chronic myelogenous leukemia, and hairy cell leukemia. This medicine may be used for other purposes; ask your health care provider or pharmacist if you have questions. COMMON BRAND NAME(S): Roferon A What should I tell my health care provider before I take this medicine? They need to know if you have any of these conditions: -alcoholism or other drug abuse or addiction -autoimmune disease like psoriasis, Raynaud's phenomenon, rheumatoid arthritis, or systemic lupus erythematosus -blood or bleeding disorders -depression or mental disorders -diabetes -heart disease -high blood pressure -immune system problems like HIV infection -kidney disease -liver disease -lung disease -seizures -stomach problems -thyroid disease -transplant recipient -an unusual or allergic reaction to interferons, other medicines, benzyl alcohol, E. coli proteins, foods, dyes, or preservatives -pregnant or trying to get pregnant -breast-feeding How should I use this medicine? The medicine is for injection under the skin. Do not shake the prefilled syringes. Shaking can destroy the medicine. If you are giving yourself the injections, make sure you follow the directions carefully. If you have any questions about how to give your medicine, call your health care provider. If you experience flu-like effects, inject the dose at bedtime to decrease these effects. Do not reuse syringes or needles. It is important that you put your used needles and syringes in a special sharps container. Do not put them in a trash can. If you do not have a sharps container, call your pharmacist or healthcare provider to get one. A special MedGuide will be given to you by the pharmacist with each  prescription and refill. Be sure to read this information carefully each time. Talk to your pediatrician regarding the use of this medicine in children. Special care may be needed. Patients over 65 years old may have a stronger reaction and need a smaller dose. Overdosage: If you think you have taken too much of this medicine contact a poison control center or emergency room at once. NOTE: This medicine is only for you. Do not share this medicine with others. What if I miss a dose? If you miss a dose, take it as soon as you can. If it is almost time for your next dose, take only that dose. Do not take double or extra doses or take more than one dose in a day unless told to do so by your doctor. If you forget a dose for more than 2 days, call your health care professional. If you accidentally take too much, call your doctor immediately. What may interact with this medicine? -alcohol -antiviral medicines for HIV or AIDS -theophylline This list may not describe all possible interactions. Give your health care provider a list of all the medicines, herbs, non-prescription drugs, or dietary supplements you use. Also tell them if you smoke, drink alcohol, or use illegal drugs. Some items may interact with your medicine. What should I watch for while using this medicine? Visit your doctor or health care professional for regular checks on your progress. You will need regular blood checks. Do not change brands of medicine without consulting your doctor or health care professional. Different brands of medicine can act differently in your body. Check with your pharmacist if your refills do not look like your original product. You may get drowsy or dizzy. Do not drive, use machinery, or do anything that   needs mental alertness until you know how this medicine affects you. Alcohol can make you more drowsy or dizzy, increase confusion and lightheadedness. Avoid alcoholic drinks. This medicine can cause flu-like  symptoms and make you feel generally unwell. Report any side effects, but continue your medicine even though you feel ill, unless your doctor or health care professional tells you to stop. If you get a fever or sore throat that does not go away after the first few weeks of treatment, do not treat yourself. Call your doctor or health care professional as soon as you can if you think you have an infection. Other signs of infection include cough, lower back or side pain, and pain or difficulty passing urine. Do not become pregnant while taking this medicine. Women should inform their doctor if they wish to become pregnant or think they might be pregnant. There is a potential for serious side effects to an unborn child. Talk to your health care professional or pharmacist for more information. Do not breast-feed an infant while taking this medicine. This medicine can cause blood problems and may increase your risk to bruise or bleed. Call your doctor or health care professional if you notice any unusual bleeding. Be careful not to cut, bruise, or injure yourself because you may get an infection and bleed more than usual. Be careful brushing and flossing your teeth or using a toothpick while receiving this medicine because you may get an infection or bleed more easily. If you have any dental work done, tell your dentist you are receiving this medicine. What side effects may I notice from receiving this medicine? Side effects that you should report to your doctor or health care professional as soon as possible: -allergic reactions like skin rash, itching or hives, swelling of the face, lips, or tongue -breathing problems -changes in vision -chest pain or palpitations -depression -fast, irregular heartbeat -seizures -severe abdominal pain -signs of infection - fever or chills, cough, sore throat, pain or difficulty passing urine -unusual bleeding or bruising -unusually weak or tired -yellowing of the eyes  or skin Side effects that usually do not require medical attention (report to your doctor or health care professional if they continue or are bothersome): -hair loss -headaches -joint, leg, muscle, or back aches -nausea, vomiting -tiredness -weight loss This list may not describe all possible side effects. Call your doctor for medical advice about side effects. You may report side effects to FDA at 1-800-FDA-1088. Where should I keep my medicine? Keep out of the reach of children. Store in a refrigerator between 2 and 8 degrees C (36 and 46 degrees F). Do not freeze or expose to light. Throw away any unused medicine after the expiration date. NOTE: This sheet is a summary. It may not cover all possible information. If you have questions about this medicine, talk to your doctor, pharmacist, or health care provider.  2015, Elsevier/Gold Standard. (2007-07-25 16:04:33)  

## 2013-12-24 MED FILL — Peginterferon alfa-2a Inj Kit 180 MCG/0.5ML: SUBCUTANEOUS | Qty: 0.5 | Status: AC

## 2013-12-25 ENCOUNTER — Telehealth: Payer: Self-pay | Admitting: Hematology & Oncology

## 2013-12-25 NOTE — Telephone Encounter (Signed)
Faxed medical records today to: [place]: [phone] F: [phone]  [facility] records from 11/20/2013 to present   COPY SCANNED

## 2013-12-25 NOTE — Telephone Encounter (Signed)
Faxed medical records today to: [place]: [phone] [id number] F: [phone]  Incident: [id number] Plan: [id number] Plan Name: [employer]  [facility] records from 11/20/2013 to present   COPY SCANNED

## 2013-12-28 ENCOUNTER — Ambulatory Visit (HOSPITAL_BASED_OUTPATIENT_CLINIC_OR_DEPARTMENT_OTHER): Payer: BC Managed Care – PPO

## 2013-12-28 ENCOUNTER — Other Ambulatory Visit (HOSPITAL_BASED_OUTPATIENT_CLINIC_OR_DEPARTMENT_OTHER): Payer: BC Managed Care – PPO | Admitting: Lab

## 2013-12-28 VITALS — BP 127/67 | HR 66 | Temp 97.8°F | Resp 16

## 2013-12-28 DIAGNOSIS — D72829 Elevated white blood cell count, unspecified: Secondary | ICD-10-CM

## 2013-12-28 DIAGNOSIS — D471 Chronic myeloproliferative disease: Secondary | ICD-10-CM

## 2013-12-28 DIAGNOSIS — D469 Myelodysplastic syndrome, unspecified: Secondary | ICD-10-CM

## 2013-12-28 LAB — CMP (CANCER CENTER ONLY)
ALK PHOS: 141 U/L — AB (ref 26–84)
ALT(SGPT): 12 U/L (ref 10–47)
AST: 14 U/L (ref 11–38)
Albumin: 4 g/dL (ref 3.3–5.5)
BUN: 9 mg/dL (ref 7–22)
CO2: 23 mEq/L (ref 18–33)
Calcium: 10 mg/dL (ref 8.0–10.3)
Chloride: 101 mEq/L (ref 98–108)
Creat: 0.6 mg/dl (ref 0.6–1.2)
Glucose, Bld: 81 mg/dL (ref 73–118)
POTASSIUM: 3.8 meq/L (ref 3.3–4.7)
SODIUM: 134 meq/L (ref 128–145)
TOTAL PROTEIN: 7.3 g/dL (ref 6.4–8.1)
Total Bilirubin: 0.6 mg/dl (ref 0.20–1.60)

## 2013-12-28 LAB — CBC WITH DIFFERENTIAL (CANCER CENTER ONLY)
BASO#: 0 10*3/uL (ref 0.0–0.2)
BASO%: 0.2 % (ref 0.0–2.0)
EOS%: 0.2 % (ref 0.0–7.0)
Eosinophils Absolute: 0 10*3/uL (ref 0.0–0.5)
HCT: 32.6 % — ABNORMAL LOW (ref 34.8–46.6)
HGB: 10.8 g/dL — ABNORMAL LOW (ref 11.6–15.9)
LYMPH#: 2.9 10*3/uL (ref 0.9–3.3)
LYMPH%: 15.4 % (ref 14.0–48.0)
MCH: 40 pg — ABNORMAL HIGH (ref 26.0–34.0)
MCHC: 33.1 g/dL (ref 32.0–36.0)
MCV: 121 fL — ABNORMAL HIGH (ref 81–101)
MONO#: 0.8 10*3/uL (ref 0.1–0.9)
MONO%: 4.2 % (ref 0.0–13.0)
NEUT%: 80 % (ref 39.6–80.0)
NEUTROS ABS: 15 10*3/uL — AB (ref 1.5–6.5)
PLATELETS: 238 10*3/uL (ref 145–400)
RBC: 2.7 10*6/uL — ABNORMAL LOW (ref 3.70–5.32)
RDW: 13.6 % (ref 11.1–15.7)
WBC: 18.7 10*3/uL — AB (ref 3.9–10.0)

## 2013-12-28 LAB — LACTATE DEHYDROGENASE: LDH: 154 U/L (ref 94–250)

## 2013-12-28 MED ORDER — PEGINTERFERON ALFA-2A 180 MCG/0.5ML ~~LOC~~ KIT: 81.0000 ug | PACK | SUBCUTANEOUS | Status: DC

## 2013-12-28 MED ORDER — PEGINTERFERON ALFA-2A 180 MCG/ML ~~LOC~~ SOLN
81.0000 ug | SUBCUTANEOUS | Status: DC
  Administered 2013-12-28: 81 ug via SUBCUTANEOUS
  Filled 2013-12-28: qty 0.45

## 2013-12-28 MED ORDER — PEGINTERFERON ALFA-2A 180 MCG/0.5ML ~~LOC~~ SOLN
81.0000 mg | Freq: Once | SUBCUTANEOUS | Status: DC
  Filled 2013-12-28: qty 1

## 2013-12-28 NOTE — Patient Instructions (Signed)
Peginterferon Alfa-2a Injection What is this medicine? PEGINTERFERON ALFA-2a (peg in ter FEER on AL fa 2 a) is a man-made drug that acts like a protein made by the body. It is used to treat chronic hepatitis B and C infections. This medicine may be used for other purposes; ask your health care provider or pharmacist if you have questions. COMMON BRAND NAME(S): Pegasys, Pegasys ProClick What should I tell my health care provider before I take this medicine? They need to know if you have any of these conditions: -alcoholism -auto-immune hepatitis -blood or bleeding disorders -colitis like ulcerative colitis or Crohn's disease -depression or other mental disorders -diabetes -drug abuse or addiction -heart disease -history of cancer -kidney disease -lupus -psoriasis -rheumatoid arthritis -thyroid problems -an unusual or allergic reaction to peginterferon, other medicines, foods, dyes, or preservatives like benzyl alcohol -pregnant or trying to get pregnant -breast-feeding How should I use this medicine? This medicine is for injection under the skin. Do NOT shake this medicine. You will be taught how to prepare and give this medicine. Use exactly as directed. Take your medicine at regular intervals. Do not take your medicine more often than directed. It is important that you put your used needles and syringes in a special sharps container. Do not put them in a trash can. If you do not have a sharps container, call your pharmacist or healthcare provider to get one. A special MedGuide will be given to you by the pharmacist with each prescription and refill. Be sure to read this information carefully each time. Talk to your pediatrician regarding the use of this medicine in children. While this drug may be prescribed for children as young as 5 years for selected conditions, precautions do apply. Overdosage: If you think you have taken too much of this medicine contact a poison control center or  emergency room at once. NOTE: This medicine is only for you. Do not share this medicine with others. What if I miss a dose? If you miss a dose and you remember within 2 days of when you missed your dose, take it as soon as you can. If it is late in the day, wait until the next day to take your dose. If more than 2 days have passed since you missed your dose ask your doctor what to do. Do not take double or extra doses. What may interact with this medicine? -alcohol -medicines for HIV like didanosine, lamivudine, stavudine, zidovudine -methadone -theophylline This list may not describe all possible interactions. Give your health care provider a list of all the medicines, herbs, non-prescription drugs, or dietary supplements you use. Also tell them if you smoke, drink alcohol, or use illegal drugs. Some items may interact with your medicine. What should I watch for while using this medicine? Visit your doctor or health care professional for regular checks on your progress. You will need regular blood checks. You may get drowsy or dizzy. Do not drive, use machinery, or do anything that needs mental alertness until you know how this medicine affects you. Do not stand or sit up quickly, especially if you are an older patient. This reduces the risk of dizzy or fainting spells. Alcohol may interfere with the effect of this medicine. Avoid alcoholic drinks. What side effects may I notice from receiving this medicine? Side effects that you should report to your doctor or health care professional as soon as possible: -allergic reactions like skin rash, itching or hives, swelling of the face, lips, or tongue -bloody   diarrhea -breathing problems -change in blood sugar -changes in vision -chest pain -confusion, trouble speaking or understanding -fast, irregular heartbeat -fever -high blood pressure -increased anger, depression, irritability, or thoughts of suicide -pain in lower back or stomach -pain,  tingling, numbness in the hands or feet -sudden numbness or weakness of the face, arm or leg -trouble passing urine or change in the amount of urine -trouble walking, dizziness, loss of balance or coordination -unusual bleeding or bruising Side effects that usually do not require medical attention (report to your doctor or health care professional if they continue or are bothersome): -aches, pains -dry, itchy skin -hair loss -loss of appetite -nausea, stomach upset -pain or swelling at site where injected -trouble sleeping -unusually weak or tired This list may not describe all possible side effects. Call your doctor for medical advice about side effects. You may report side effects to FDA at 1-800-FDA-1088. Where should I keep my medicine? Keep out of the reach of children. Store in a refrigerator between 2 and 8 degrees C (36 and 46 degrees F). Do not freeze. Protect from light. Do not leave this medicine out of the refrigerator for more than 24 hours. Throw away any unused medicine after the expiration date. NOTE: This sheet is a summary. It may not cover all possible information. If you have questions about this medicine, talk to your doctor, pharmacist, or health care provider.  2015, Elsevier/Gold Standard. (2009-11-13 17:27:34)  

## 2013-12-28 NOTE — Progress Notes (Signed)
Pt took Ibuprofen at home prior to receiving injection; this is okay per Dr Marin Olp.

## 2014-01-03 MED FILL — Peginterferon alfa-2a Inj Kit 180 MCG/0.5ML: SUBCUTANEOUS | Qty: 0.5 | Status: AC

## 2014-01-04 ENCOUNTER — Ambulatory Visit (HOSPITAL_BASED_OUTPATIENT_CLINIC_OR_DEPARTMENT_OTHER): Payer: BC Managed Care – PPO

## 2014-01-04 ENCOUNTER — Other Ambulatory Visit: Payer: BC Managed Care – PPO | Admitting: Lab

## 2014-01-04 ENCOUNTER — Ambulatory Visit: Payer: BC Managed Care – PPO | Admitting: Family

## 2014-01-04 ENCOUNTER — Encounter: Payer: Self-pay | Admitting: Family

## 2014-01-04 ENCOUNTER — Ambulatory Visit (HOSPITAL_BASED_OUTPATIENT_CLINIC_OR_DEPARTMENT_OTHER): Payer: BC Managed Care – PPO | Admitting: Family

## 2014-01-04 ENCOUNTER — Other Ambulatory Visit (HOSPITAL_BASED_OUTPATIENT_CLINIC_OR_DEPARTMENT_OTHER): Payer: BC Managed Care – PPO | Admitting: Lab

## 2014-01-04 VITALS — BP 130/73 | HR 79 | Temp 98.6°F | Resp 14 | Ht 65.0 in | Wt 235.0 lb

## 2014-01-04 DIAGNOSIS — D471 Chronic myeloproliferative disease: Secondary | ICD-10-CM

## 2014-01-04 DIAGNOSIS — D72829 Elevated white blood cell count, unspecified: Secondary | ICD-10-CM

## 2014-01-04 LAB — CBC WITH DIFFERENTIAL (CANCER CENTER ONLY)
BASO#: 0 10*3/uL (ref 0.0–0.2)
BASO%: 0.1 % (ref 0.0–2.0)
EOS%: 0.3 % (ref 0.0–7.0)
Eosinophils Absolute: 0.1 10*3/uL (ref 0.0–0.5)
HEMATOCRIT: 36.1 % (ref 34.8–46.6)
HGB: 11.7 g/dL (ref 11.6–15.9)
LYMPH#: 3 10*3/uL (ref 0.9–3.3)
LYMPH%: 15.1 % (ref 14.0–48.0)
MCH: 38.2 pg — ABNORMAL HIGH (ref 26.0–34.0)
MCHC: 32.4 g/dL (ref 32.0–36.0)
MCV: 118 fL — ABNORMAL HIGH (ref 81–101)
MONO#: 0.9 10*3/uL (ref 0.1–0.9)
MONO%: 4.7 % (ref 0.0–13.0)
NEUT#: 16.1 10*3/uL — ABNORMAL HIGH (ref 1.5–6.5)
NEUT%: 79.8 % (ref 39.6–80.0)
Platelets: 276 10*3/uL (ref 145–400)
RBC: 3.06 10*6/uL — AB (ref 3.70–5.32)
RDW: 13.7 % (ref 11.1–15.7)
WBC: 20.1 10*3/uL — ABNORMAL HIGH (ref 3.9–10.0)

## 2014-01-04 MED ORDER — PEGINTERFERON ALFA-2A 180 MCG/0.5ML ~~LOC~~ KIT: 81.0000 ug | PACK | Freq: Once | SUBCUTANEOUS | Status: DC

## 2014-01-04 MED ORDER — PEGINTERFERON ALFA-2A 180 MCG/ML ~~LOC~~ SOLN
81.0000 ug | SUBCUTANEOUS | Status: DC
  Administered 2014-01-04: 81 ug via SUBCUTANEOUS
  Filled 2014-01-04: qty 0.45

## 2014-01-04 NOTE — Progress Notes (Signed)
[place]  Telephone:(336) [phone] Fax:(336) [phone]  ID: Brianna Fletcher OB: [birth year] MR#: [id number]#:[id number] Patient Care Team: Baruch Goldmann, PA-C as PCP - General (Physician Assistant)  DIAGNOSIS: Chronic myeloproliferative syndrome-JAK2 negative  INTERVAL HISTORY: Brianna Fletcher is here today for a follow-up. She is feeling much better since we lowered those dose of her injection. Her itching has much improved. She is still out on disability right now but is looking forward to getting back to work. She denies fever, chills,n/v, cough, rash, headache, dizziness, SOB, chest pain, palpitations, abdominal pain, constipation, diarrhea, blood in urine or stool. She has had no swelling, tenderness, numbness or tingling in her extremities. She has had no bleeding or pain. Her appetite is good and she is drinking plenty of fluids.   CURRENT TREATMENT: PEG-Interferon injection weekly  REVIEW OF SYSTEMS: All other 10 point review of systems is negative except for those issues mentioned above.  PAST MEDICAL HISTORY: Past Medical History  Diagnosis Date  . Glaucoma   . Arthritis   . Leukocytosis, unspecified 11/15/2012  . Chronic neutrophilia 11/15/2012  . Pruritus 11/15/2012  . Monocytosis 11/15/2012  . Myeloproliferative disorder 03/08/2013   PAST SURGICAL HISTORY: Past Surgical History  Procedure Laterality Date  . Tubal ligation  2000  . Eye surgery      laser eye surgery   FAMILY HISTORY No family history on file.  GYNECOLOGIC HISTORY:  No LMP recorded.   SOCIAL HISTORY:  History   Social History  . Marital Status: Divorced    Spouse Name: N/A    Number of Children: N/A  . Years of Education: N/A   Occupational History  . Not on file.   Social History Main Topics  . Smoking status: Never Smoker   . Smokeless tobacco: Never Used     Comment: never used tobacco  . Alcohol Use: No  . Drug Use: Not on file  . Sexual Activity: Not on file   Other  Topics Concern  . Not on file   Social History Narrative  . No narrative on file   ADVANCED DIRECTIVES: <no information>  HEALTH MAINTENANCE: History  Substance Use Topics  . Smoking status: Never Smoker   . Smokeless tobacco: Never Used     Comment: never used tobacco  . Alcohol Use: No   Colonoscopy: PAP: Bone density: Lipid panel:  No Known Allergies  Current Outpatient Prescriptions  Medication Sig Dispense Refill  . doxepin (SINEQUAN) 50 MG capsule Take 50 mg by mouth at bedtime.      . folic acid (FOLVITE) 1 MG tablet Take 2 mg by mouth daily.      . INTERFERON ALFA-2A [place] Inject into the skin as needed.       Marland Kitchen MELATONIN PO Take 1 tablet by mouth at bedtime as needed (FOR SLEEP).       No current facility-administered medications for this visit.   Facility-Administered Medications Ordered in Other Visits  Medication Dose Route Frequency Provider Last Rate Last Dose  . peginterferon alfa-2a (PEGASYS) injection 81 mcg  81 mcg Subcutaneous Weekly [name], MD   81 mcg at 01/04/14 1601   OBJECTIVE: Filed Vitals:   01/04/14 1434  BP: 130/73  Pulse: 79  Temp: 98.6 F (37 C)  Resp: 14   Body mass index is 39.11 kg/(m^2). ECOG FS:0 - Asymptomatic Ocular: Sclerae unicteric, pupils equal, round and reactive to light Ear-nose-throat: Oropharynx clear, dentition fair Lymphatic: No cervical or supraclavicular adenopathy Lungs no  rales or rhonchi, good excursion bilaterally Heart regular rate and rhythm, no murmur appreciated Abd soft, nontender, positive bowel sounds MSK no focal spinal tenderness, no joint edema Neuro: non-focal, well-oriented, appropriate affect Breasts: Deferred  LAB RESULTS: CMP     Component Value Date/Time   NA 134 12/28/2013 1418   NA 134* 11/20/2013 1116   K 3.8 12/28/2013 1418   K 4.1 11/20/2013 1116   CL 101 12/28/2013 1418   CL 102 11/20/2013 1116   CO2 23 12/28/2013 1418   CO2 23 11/20/2013 1116   GLUCOSE 81 12/28/2013 1418   GLUCOSE  93 11/20/2013 1116   BUN 9 12/28/2013 1418   BUN 9 11/20/2013 1116   CREATININE 0.6 12/28/2013 1418   CREATININE 0.68 11/20/2013 1116   CALCIUM 10.0 12/28/2013 1418   CALCIUM 9.9 11/20/2013 1116   PROT 7.3 12/28/2013 1418   PROT 6.9 11/20/2013 1116   ALBUMIN 4.3 11/20/2013 1116   AST 14 12/28/2013 1418   AST 13 11/20/2013 1116   ALT 12 12/28/2013 1418   ALT 9 11/20/2013 1116   ALKPHOS 141* 12/28/2013 1418   ALKPHOS 123* 11/20/2013 1116   BILITOT 0.60 12/28/2013 1418   BILITOT 0.6 11/20/2013 1116   GFRNONAA >60 05/29/2010 1850   GFRAA  Value: >60        The eGFR has been calculated using the MDRD equation. This calculation has not been validated in all clinical situations. eGFR's persistently <60 mL/min signify possible Chronic Kidney Disease. 05/29/2010 1850   No results found for this basename: SPEP,  UPEP,   kappa and lambda light chains   Lab Results  Component Value Date   WBC 20.1* 01/04/2014   NEUTROABS 16.1* 01/04/2014   HGB 11.7 01/04/2014   HCT 36.1 01/04/2014   MCV 118* 01/04/2014   PLT 276 01/04/2014   No results found for this basename: LABCA2   No components found with this basename: [id number]   No results found for this basename: INR,  in the last 168 hours  STUDIES: None  ASSESSMENT/PLAN: Brianna Fletcher is 43 year old female with myeloproliferative syndrome. She is doing well on the interferon injections. Decreasing the dose has improved the fatigue.  Her CBC today was ok, we will continue to monitor her blood counts.  We will give her a new schedule today. We will continue with weekly injections.  We will see her in 3 weeks for a follow-up and labs.  She knows to cal here with any questions or concerns and to go to the ED in the event of an emergency. We can certainly see her sooner if need be.   Eliezer Bottom, NP 01/04/2014 4:29 PM

## 2014-01-04 NOTE — Patient Instructions (Signed)
Peginterferon Alfa-2a Injection What is this medicine? PEGINTERFERON ALFA-2a (peg in ter FEER on AL fa 2 a) is a man-made drug that acts like a protein made by the body. It is used to treat chronic hepatitis B and C infections. This medicine may be used for other purposes; ask your health care provider or pharmacist if you have questions. COMMON BRAND NAME(S): Pegasys, Pegasys ProClick What should I tell my health care provider before I take this medicine? They need to know if you have any of these conditions: -alcoholism -auto-immune hepatitis -blood or bleeding disorders -colitis like ulcerative colitis or Crohn's disease -depression or other mental disorders -diabetes -drug abuse or addiction -heart disease -history of cancer -kidney disease -lupus -psoriasis -rheumatoid arthritis -thyroid problems -an unusual or allergic reaction to peginterferon, other medicines, foods, dyes, or preservatives like benzyl alcohol -pregnant or trying to get pregnant -breast-feeding How should I use this medicine? This medicine is for injection under the skin. Do NOT shake this medicine. You will be taught how to prepare and give this medicine. Use exactly as directed. Take your medicine at regular intervals. Do not take your medicine more often than directed. It is important that you put your used needles and syringes in a special sharps container. Do not put them in a trash can. If you do not have a sharps container, call your pharmacist or healthcare provider to get one. A special MedGuide will be given to you by the pharmacist with each prescription and refill. Be sure to read this information carefully each time. Talk to your pediatrician regarding the use of this medicine in children. While this drug may be prescribed for children as young as 5 years for selected conditions, precautions do apply. Overdosage: If you think you have taken too much of this medicine contact a poison control center or  emergency room at once. NOTE: This medicine is only for you. Do not share this medicine with others. What if I miss a dose? If you miss a dose and you remember within 2 days of when you missed your dose, take it as soon as you can. If it is late in the day, wait until the next day to take your dose. If more than 2 days have passed since you missed your dose ask your doctor what to do. Do not take double or extra doses. What may interact with this medicine? -alcohol -medicines for HIV like didanosine, lamivudine, stavudine, zidovudine -methadone -theophylline This list may not describe all possible interactions. Give your health care provider a list of all the medicines, herbs, non-prescription drugs, or dietary supplements you use. Also tell them if you smoke, drink alcohol, or use illegal drugs. Some items may interact with your medicine. What should I watch for while using this medicine? Visit your doctor or health care professional for regular checks on your progress. You will need regular blood checks. You may get drowsy or dizzy. Do not drive, use machinery, or do anything that needs mental alertness until you know how this medicine affects you. Do not stand or sit up quickly, especially if you are an older patient. This reduces the risk of dizzy or fainting spells. Alcohol may interfere with the effect of this medicine. Avoid alcoholic drinks. What side effects may I notice from receiving this medicine? Side effects that you should report to your doctor or health care professional as soon as possible: -allergic reactions like skin rash, itching or hives, swelling of the face, lips, or tongue -bloody   diarrhea -breathing problems -change in blood sugar -changes in vision -chest pain -confusion, trouble speaking or understanding -fast, irregular heartbeat -fever -high blood pressure -increased anger, depression, irritability, or thoughts of suicide -pain in lower back or stomach -pain,  tingling, numbness in the hands or feet -sudden numbness or weakness of the face, arm or leg -trouble passing urine or change in the amount of urine -trouble walking, dizziness, loss of balance or coordination -unusual bleeding or bruising Side effects that usually do not require medical attention (report to your doctor or health care professional if they continue or are bothersome): -aches, pains -dry, itchy skin -hair loss -loss of appetite -nausea, stomach upset -pain or swelling at site where injected -trouble sleeping -unusually weak or tired This list may not describe all possible side effects. Call your doctor for medical advice about side effects. You may report side effects to FDA at 1-800-FDA-1088. Where should I keep my medicine? Keep out of the reach of children. Store in a refrigerator between 2 and 8 degrees C (36 and 46 degrees F). Do not freeze. Protect from light. Do not leave this medicine out of the refrigerator for more than 24 hours. Throw away any unused medicine after the expiration date. NOTE: This sheet is a summary. It may not cover all possible information. If you have questions about this medicine, talk to your doctor, pharmacist, or health care provider.  2015, Elsevier/Gold Standard. (2009-11-13 17:27:34)  

## 2014-01-07 MED FILL — Peginterferon alfa-2a Inj Kit 180 MCG/0.5ML: SUBCUTANEOUS | Qty: 0.5 | Status: AC

## 2014-01-11 ENCOUNTER — Other Ambulatory Visit (HOSPITAL_BASED_OUTPATIENT_CLINIC_OR_DEPARTMENT_OTHER): Payer: BC Managed Care – PPO | Admitting: Lab

## 2014-01-11 ENCOUNTER — Ambulatory Visit (HOSPITAL_BASED_OUTPATIENT_CLINIC_OR_DEPARTMENT_OTHER): Payer: BC Managed Care – PPO

## 2014-01-11 ENCOUNTER — Other Ambulatory Visit: Payer: Self-pay | Admitting: Hematology & Oncology

## 2014-01-11 VITALS — BP 134/68 | HR 66 | Temp 98.2°F | Resp 16

## 2014-01-11 DIAGNOSIS — D471 Chronic myeloproliferative disease: Secondary | ICD-10-CM

## 2014-01-11 DIAGNOSIS — D72829 Elevated white blood cell count, unspecified: Secondary | ICD-10-CM

## 2014-01-11 LAB — CBC WITH DIFFERENTIAL (CANCER CENTER ONLY)
BASO#: 0 10*3/uL (ref 0.0–0.2)
BASO%: 0.1 % (ref 0.0–2.0)
EOS%: 0.1 % (ref 0.0–7.0)
Eosinophils Absolute: 0 10*3/uL (ref 0.0–0.5)
HCT: 33.9 % — ABNORMAL LOW (ref 34.8–46.6)
HEMOGLOBIN: 11 g/dL — AB (ref 11.6–15.9)
LYMPH#: 3.4 10*3/uL — ABNORMAL HIGH (ref 0.9–3.3)
LYMPH%: 14.2 % (ref 14.0–48.0)
MCH: 37.4 pg — AB (ref 26.0–34.0)
MCHC: 32.4 g/dL (ref 32.0–36.0)
MCV: 115 fL — AB (ref 81–101)
MONO#: 1.3 10*3/uL — ABNORMAL HIGH (ref 0.1–0.9)
MONO%: 5.6 % (ref 0.0–13.0)
NEUT#: 19 10*3/uL — ABNORMAL HIGH (ref 1.5–6.5)
NEUT%: 80 % (ref 39.6–80.0)
PLATELETS: 215 10*3/uL (ref 145–400)
RBC: 2.94 10*6/uL — ABNORMAL LOW (ref 3.70–5.32)
RDW: 14.6 % (ref 11.1–15.7)
WBC: 23.8 10*3/uL — ABNORMAL HIGH (ref 3.9–10.0)

## 2014-01-11 MED ORDER — PEGINTERFERON ALFA-2A 180 MCG/ML ~~LOC~~ SOLN
81.0000 ug | Freq: Once | SUBCUTANEOUS | Status: DC
  Filled 2014-01-11: qty 0.45

## 2014-01-11 MED ORDER — PEGINTERFERON ALFA-2A 180 MCG/ML ~~LOC~~ SOLN
81.0000 ug | Freq: Once | SUBCUTANEOUS | Status: AC
  Administered 2014-01-11: 81 ug via SUBCUTANEOUS
  Filled 2014-01-11: qty 0.45

## 2014-01-11 MED ORDER — PEGINTERFERON ALFA-2A 180 MCG/0.5ML ~~LOC~~ KIT: 81.0000 ug | PACK | Freq: Once | SUBCUTANEOUS | Status: DC

## 2014-01-11 NOTE — Patient Instructions (Signed)
Peginterferon Alfa-2a Injection What is this medicine? PEGINTERFERON ALFA-2a (peg in ter FEER on AL fa 2 a) is a man-made drug that acts like a protein made by the body. It is used to treat chronic hepatitis B and C infections. This medicine may be used for other purposes; ask your health care provider or pharmacist if you have questions. COMMON BRAND NAME(S): Pegasys, Pegasys ProClick What should I tell my health care provider before I take this medicine? They need to know if you have any of these conditions: -alcoholism -auto-immune hepatitis -blood or bleeding disorders -colitis like ulcerative colitis or Crohn's disease -depression or other mental disorders -diabetes -drug abuse or addiction -heart disease -history of cancer -kidney disease -lupus -psoriasis -rheumatoid arthritis -thyroid problems -an unusual or allergic reaction to peginterferon, other medicines, foods, dyes, or preservatives like benzyl alcohol -pregnant or trying to get pregnant -breast-feeding How should I use this medicine? This medicine is for injection under the skin. Do NOT shake this medicine. You will be taught how to prepare and give this medicine. Use exactly as directed. Take your medicine at regular intervals. Do not take your medicine more often than directed. It is important that you put your used needles and syringes in a special sharps container. Do not put them in a trash can. If you do not have a sharps container, call your pharmacist or healthcare provider to get one. A special MedGuide will be given to you by the pharmacist with each prescription and refill. Be sure to read this information carefully each time. Talk to your pediatrician regarding the use of this medicine in children. While this drug may be prescribed for children as young as 5 years for selected conditions, precautions do apply. Overdosage: If you think you have taken too much of this medicine contact a poison control center or  emergency room at once. NOTE: This medicine is only for you. Do not share this medicine with others. What if I miss a dose? If you miss a dose and you remember within 2 days of when you missed your dose, take it as soon as you can. If it is late in the day, wait until the next day to take your dose. If more than 2 days have passed since you missed your dose ask your doctor what to do. Do not take double or extra doses. What may interact with this medicine? -alcohol -medicines for HIV like didanosine, lamivudine, stavudine, zidovudine -methadone -theophylline This list may not describe all possible interactions. Give your health care provider a list of all the medicines, herbs, non-prescription drugs, or dietary supplements you use. Also tell them if you smoke, drink alcohol, or use illegal drugs. Some items may interact with your medicine. What should I watch for while using this medicine? Visit your doctor or health care professional for regular checks on your progress. You will need regular blood checks. You may get drowsy or dizzy. Do not drive, use machinery, or do anything that needs mental alertness until you know how this medicine affects you. Do not stand or sit up quickly, especially if you are an older patient. This reduces the risk of dizzy or fainting spells. Alcohol may interfere with the effect of this medicine. Avoid alcoholic drinks. What side effects may I notice from receiving this medicine? Side effects that you should report to your doctor or health care professional as soon as possible: -allergic reactions like skin rash, itching or hives, swelling of the face, lips, or tongue -bloody   diarrhea -breathing problems -change in blood sugar -changes in vision -chest pain -confusion, trouble speaking or understanding -fast, irregular heartbeat -fever -high blood pressure -increased anger, depression, irritability, or thoughts of suicide -pain in lower back or stomach -pain,  tingling, numbness in the hands or feet -sudden numbness or weakness of the face, arm or leg -trouble passing urine or change in the amount of urine -trouble walking, dizziness, loss of balance or coordination -unusual bleeding or bruising Side effects that usually do not require medical attention (report to your doctor or health care professional if they continue or are bothersome): -aches, pains -dry, itchy skin -hair loss -loss of appetite -nausea, stomach upset -pain or swelling at site where injected -trouble sleeping -unusually weak or tired This list may not describe all possible side effects. Call your doctor for medical advice about side effects. You may report side effects to FDA at 1-800-FDA-1088. Where should I keep my medicine? Keep out of the reach of children. Store in a refrigerator between 2 and 8 degrees C (36 and 46 degrees F). Do not freeze. Protect from light. Do not leave this medicine out of the refrigerator for more than 24 hours. Throw away any unused medicine after the expiration date. NOTE: This sheet is a summary. It may not cover all possible information. If you have questions about this medicine, talk to your doctor, pharmacist, or health care provider.  2015, Elsevier/Gold Standard. (2009-11-13 17:27:34)  

## 2014-01-16 ENCOUNTER — Encounter: Payer: Self-pay | Admitting: Hematology & Oncology

## 2014-01-18 ENCOUNTER — Ambulatory Visit (HOSPITAL_BASED_OUTPATIENT_CLINIC_OR_DEPARTMENT_OTHER): Payer: BC Managed Care – PPO

## 2014-01-18 ENCOUNTER — Other Ambulatory Visit (HOSPITAL_BASED_OUTPATIENT_CLINIC_OR_DEPARTMENT_OTHER): Payer: BC Managed Care – PPO | Admitting: Lab

## 2014-01-18 VITALS — BP 132/70 | HR 77 | Temp 98.1°F | Resp 16

## 2014-01-18 DIAGNOSIS — D471 Chronic myeloproliferative disease: Secondary | ICD-10-CM

## 2014-01-18 DIAGNOSIS — D72829 Elevated white blood cell count, unspecified: Secondary | ICD-10-CM

## 2014-01-18 LAB — CBC WITH DIFFERENTIAL (CANCER CENTER ONLY)
BASO#: 0 10*3/uL (ref 0.0–0.2)
BASO%: 0.1 % (ref 0.0–2.0)
EOS%: 0.2 % (ref 0.0–7.0)
Eosinophils Absolute: 0 10*3/uL (ref 0.0–0.5)
HCT: 34.3 % — ABNORMAL LOW (ref 34.8–46.6)
HGB: 11.1 g/dL — ABNORMAL LOW (ref 11.6–15.9)
LYMPH#: 3.1 10*3/uL (ref 0.9–3.3)
LYMPH%: 13.5 % — AB (ref 14.0–48.0)
MCH: 36.6 pg — AB (ref 26.0–34.0)
MCHC: 32.4 g/dL (ref 32.0–36.0)
MCV: 113 fL — ABNORMAL HIGH (ref 81–101)
MONO#: 1 10*3/uL — ABNORMAL HIGH (ref 0.1–0.9)
MONO%: 4.6 % (ref 0.0–13.0)
NEUT#: 18.4 10*3/uL — ABNORMAL HIGH (ref 1.5–6.5)
NEUT%: 81.6 % — ABNORMAL HIGH (ref 39.6–80.0)
PLATELETS: 214 10*3/uL (ref 145–400)
RBC: 3.03 10*6/uL — ABNORMAL LOW (ref 3.70–5.32)
RDW: 14.9 % (ref 11.1–15.7)
WBC: 22.5 10*3/uL — ABNORMAL HIGH (ref 3.9–10.0)

## 2014-01-18 LAB — TECHNOLOGIST REVIEW CHCC SATELLITE

## 2014-01-18 MED ORDER — PEGINTERFERON ALFA-2A 180 MCG/0.5ML ~~LOC~~ KIT: 81.0000 ug | PACK | Freq: Once | SUBCUTANEOUS | Status: DC

## 2014-01-18 MED ORDER — PEGINTERFERON ALFA-2A 180 MCG/ML ~~LOC~~ SOLN
81.0000 ug | SUBCUTANEOUS | Status: DC
  Administered 2014-01-18: 81 ug via SUBCUTANEOUS
  Filled 2014-01-18: qty 0.45

## 2014-01-18 NOTE — Patient Instructions (Signed)
Peginterferon Alfa-2a Injection What is this medicine? PEGINTERFERON ALFA-2a (peg in ter FEER on AL fa 2 a) is a man-made drug that acts like a protein made by the body. It is used to treat chronic hepatitis B and C infections. This medicine may be used for other purposes; ask your health care provider or pharmacist if you have questions. COMMON BRAND NAME(S): Pegasys, Pegasys ProClick What should I tell my health care provider before I take this medicine? They need to know if you have any of these conditions: -alcoholism -auto-immune hepatitis -blood or bleeding disorders -colitis like ulcerative colitis or Crohn's disease -depression or other mental disorders -diabetes -drug abuse or addiction -heart disease -history of cancer -kidney disease -lupus -psoriasis -rheumatoid arthritis -thyroid problems -an unusual or allergic reaction to peginterferon, other medicines, foods, dyes, or preservatives like benzyl alcohol -pregnant or trying to get pregnant -breast-feeding How should I use this medicine? This medicine is for injection under the skin. Do NOT shake this medicine. You will be taught how to prepare and give this medicine. Use exactly as directed. Take your medicine at regular intervals. Do not take your medicine more often than directed. It is important that you put your used needles and syringes in a special sharps container. Do not put them in a trash can. If you do not have a sharps container, call your pharmacist or healthcare provider to get one. A special MedGuide will be given to you by the pharmacist with each prescription and refill. Be sure to read this information carefully each time. Talk to your pediatrician regarding the use of this medicine in children. While this drug may be prescribed for children as young as 5 years for selected conditions, precautions do apply. Overdosage: If you think you have taken too much of this medicine contact a poison control center or  emergency room at once. NOTE: This medicine is only for you. Do not share this medicine with others. What if I miss a dose? If you miss a dose and you remember within 2 days of when you missed your dose, take it as soon as you can. If it is late in the day, wait until the next day to take your dose. If more than 2 days have passed since you missed your dose ask your doctor what to do. Do not take double or extra doses. What may interact with this medicine? -alcohol -medicines for HIV like didanosine, lamivudine, stavudine, zidovudine -methadone -theophylline This list may not describe all possible interactions. Give your health care provider a list of all the medicines, herbs, non-prescription drugs, or dietary supplements you use. Also tell them if you smoke, drink alcohol, or use illegal drugs. Some items may interact with your medicine. What should I watch for while using this medicine? Visit your doctor or health care professional for regular checks on your progress. You will need regular blood checks. You may get drowsy or dizzy. Do not drive, use machinery, or do anything that needs mental alertness until you know how this medicine affects you. Do not stand or sit up quickly, especially if you are an older patient. This reduces the risk of dizzy or fainting spells. Alcohol may interfere with the effect of this medicine. Avoid alcoholic drinks. What side effects may I notice from receiving this medicine? Side effects that you should report to your doctor or health care professional as soon as possible: -allergic reactions like skin rash, itching or hives, swelling of the face, lips, or tongue -bloody   diarrhea -breathing problems -change in blood sugar -changes in vision -chest pain -confusion, trouble speaking or understanding -fast, irregular heartbeat -fever -high blood pressure -increased anger, depression, irritability, or thoughts of suicide -pain in lower back or stomach -pain,  tingling, numbness in the hands or feet -sudden numbness or weakness of the face, arm or leg -trouble passing urine or change in the amount of urine -trouble walking, dizziness, loss of balance or coordination -unusual bleeding or bruising Side effects that usually do not require medical attention (report to your doctor or health care professional if they continue or are bothersome): -aches, pains -dry, itchy skin -hair loss -loss of appetite -nausea, stomach upset -pain or swelling at site where injected -trouble sleeping -unusually weak or tired This list may not describe all possible side effects. Call your doctor for medical advice about side effects. You may report side effects to FDA at 1-800-FDA-1088. Where should I keep my medicine? Keep out of the reach of children. Store in a refrigerator between 2 and 8 degrees C (36 and 46 degrees F). Do not freeze. Protect from light. Do not leave this medicine out of the refrigerator for more than 24 hours. Throw away any unused medicine after the expiration date. NOTE: This sheet is a summary. It may not cover all possible information. If you have questions about this medicine, talk to your doctor, pharmacist, or health care provider.  2015, Elsevier/Gold Standard. (2009-11-13 17:27:34)  

## 2014-01-23 ENCOUNTER — Telehealth: Payer: Self-pay | Admitting: Hematology & Oncology

## 2014-01-23 NOTE — Telephone Encounter (Signed)
Faxed medical records again today to: [place]: [phone] [id number] F: [phone]  Incident: [id number] Plan: [id number] Plan Name: [employer]  [facility] records from 11/20/2013 to present   COPY SCANNED

## 2014-01-25 ENCOUNTER — Encounter: Payer: Self-pay | Admitting: *Deleted

## 2014-01-25 ENCOUNTER — Ambulatory Visit (HOSPITAL_BASED_OUTPATIENT_CLINIC_OR_DEPARTMENT_OTHER): Payer: BC Managed Care – PPO | Admitting: Family

## 2014-01-25 ENCOUNTER — Other Ambulatory Visit (HOSPITAL_BASED_OUTPATIENT_CLINIC_OR_DEPARTMENT_OTHER): Payer: BC Managed Care – PPO | Admitting: Lab

## 2014-01-25 ENCOUNTER — Ambulatory Visit (HOSPITAL_BASED_OUTPATIENT_CLINIC_OR_DEPARTMENT_OTHER): Payer: BC Managed Care – PPO

## 2014-01-25 ENCOUNTER — Encounter: Payer: Self-pay | Admitting: Family

## 2014-01-25 DIAGNOSIS — D471 Chronic myeloproliferative disease: Secondary | ICD-10-CM

## 2014-01-25 DIAGNOSIS — D72829 Elevated white blood cell count, unspecified: Secondary | ICD-10-CM

## 2014-01-25 LAB — CBC WITH DIFFERENTIAL (CANCER CENTER ONLY)
BASO#: 0 10*3/uL (ref 0.0–0.2)
BASO%: 0.1 % (ref 0.0–2.0)
EOS%: 0.2 % (ref 0.0–7.0)
Eosinophils Absolute: 0 10*3/uL (ref 0.0–0.5)
HCT: 36.6 % (ref 34.8–46.6)
HGB: 11.7 g/dL (ref 11.6–15.9)
LYMPH#: 3.1 10*3/uL (ref 0.9–3.3)
LYMPH%: 15.7 % (ref 14.0–48.0)
MCH: 35.6 pg — AB (ref 26.0–34.0)
MCHC: 32 g/dL (ref 32.0–36.0)
MCV: 111 fL — ABNORMAL HIGH (ref 81–101)
MONO#: 1 10*3/uL — ABNORMAL HIGH (ref 0.1–0.9)
MONO%: 4.8 % (ref 0.0–13.0)
NEUT%: 79.2 % (ref 39.6–80.0)
NEUTROS ABS: 15.7 10*3/uL — AB (ref 1.5–6.5)
PLATELETS: 227 10*3/uL (ref 145–400)
RBC: 3.29 10*6/uL — ABNORMAL LOW (ref 3.70–5.32)
RDW: 14.9 % (ref 11.1–15.7)
WBC: 19.8 10*3/uL — ABNORMAL HIGH (ref 3.9–10.0)

## 2014-01-25 LAB — TECHNOLOGIST REVIEW CHCC SATELLITE

## 2014-01-25 MED ORDER — PEGINTERFERON ALFA-2A 180 MCG/ML ~~LOC~~ SOLN
81.0000 ug | SUBCUTANEOUS | Status: DC
  Administered 2014-01-25: 81 ug via SUBCUTANEOUS
  Filled 2014-01-25: qty 0.45

## 2014-01-25 MED ORDER — ACETAMINOPHEN 325 MG PO TABS: 650.0000 mg | ORAL_TABLET | Freq: Once | ORAL | Status: DC

## 2014-01-25 MED ORDER — PEGINTERFERON ALFA-2A 180 MCG/0.5ML ~~LOC~~ KIT: 81.0000 ug | PACK | Freq: Once | SUBCUTANEOUS | Status: DC

## 2014-01-25 NOTE — Progress Notes (Signed)
Made appointment with Dr Allyson Sabal per Judson Roch [place]'s request to evaluate a lesion on her nose. Apt for December 7th was made and information given to patient.

## 2014-01-25 NOTE — Patient Instructions (Signed)
Peginterferon Alfa-2a Injection What is this medicine? PEGINTERFERON ALFA-2a (peg in ter FEER on AL fa 2 a) is a man-made drug that acts like a protein made by the body. It is used to treat chronic hepatitis B and C infections. This medicine may be used for other purposes; ask your health care provider or pharmacist if you have questions. COMMON BRAND NAME(S): Pegasys, Pegasys ProClick What should I tell my health care provider before I take this medicine? They need to know if you have any of these conditions: -alcoholism -auto-immune hepatitis -blood or bleeding disorders -colitis like ulcerative colitis or Crohn's disease -depression or other mental disorders -diabetes -drug abuse or addiction -heart disease -history of cancer -kidney disease -lupus -psoriasis -rheumatoid arthritis -thyroid problems -an unusual or allergic reaction to peginterferon, other medicines, foods, dyes, or preservatives like benzyl alcohol -pregnant or trying to get pregnant -breast-feeding How should I use this medicine? This medicine is for injection under the skin. Do NOT shake this medicine. You will be taught how to prepare and give this medicine. Use exactly as directed. Take your medicine at regular intervals. Do not take your medicine more often than directed. It is important that you put your used needles and syringes in a special sharps container. Do not put them in a trash can. If you do not have a sharps container, call your pharmacist or healthcare provider to get one. A special MedGuide will be given to you by the pharmacist with each prescription and refill. Be sure to read this information carefully each time. Talk to your pediatrician regarding the use of this medicine in children. While this drug may be prescribed for children as young as 5 years for selected conditions, precautions do apply. Overdosage: If you think you have taken too much of this medicine contact a poison control center or  emergency room at once. NOTE: This medicine is only for you. Do not share this medicine with others. What if I miss a dose? If you miss a dose and you remember within 2 days of when you missed your dose, take it as soon as you can. If it is late in the day, wait until the next day to take your dose. If more than 2 days have passed since you missed your dose ask your doctor what to do. Do not take double or extra doses. What may interact with this medicine? -alcohol -medicines for HIV like didanosine, lamivudine, stavudine, zidovudine -methadone -theophylline This list may not describe all possible interactions. Give your health care provider a list of all the medicines, herbs, non-prescription drugs, or dietary supplements you use. Also tell them if you smoke, drink alcohol, or use illegal drugs. Some items may interact with your medicine. What should I watch for while using this medicine? Visit your doctor or health care professional for regular checks on your progress. You will need regular blood checks. You may get drowsy or dizzy. Do not drive, use machinery, or do anything that needs mental alertness until you know how this medicine affects you. Do not stand or sit up quickly, especially if you are an older patient. This reduces the risk of dizzy or fainting spells. Alcohol may interfere with the effect of this medicine. Avoid alcoholic drinks. What side effects may I notice from receiving this medicine? Side effects that you should report to your doctor or health care professional as soon as possible: -allergic reactions like skin rash, itching or hives, swelling of the face, lips, or tongue -bloody   diarrhea -breathing problems -change in blood sugar -changes in vision -chest pain -confusion, trouble speaking or understanding -fast, irregular heartbeat -fever -high blood pressure -increased anger, depression, irritability, or thoughts of suicide -pain in lower back or stomach -pain,  tingling, numbness in the hands or feet -sudden numbness or weakness of the face, arm or leg -trouble passing urine or change in the amount of urine -trouble walking, dizziness, loss of balance or coordination -unusual bleeding or bruising Side effects that usually do not require medical attention (report to your doctor or health care professional if they continue or are bothersome): -aches, pains -dry, itchy skin -hair loss -loss of appetite -nausea, stomach upset -pain or swelling at site where injected -trouble sleeping -unusually weak or tired This list may not describe all possible side effects. Call your doctor for medical advice about side effects. You may report side effects to FDA at 1-800-FDA-1088. Where should I keep my medicine? Keep out of the reach of children. Store in a refrigerator between 2 and 8 degrees C (36 and 46 degrees F). Do not freeze. Protect from light. Do not leave this medicine out of the refrigerator for more than 24 hours. Throw away any unused medicine after the expiration date. NOTE: This sheet is a summary. It may not cover all possible information. If you have questions about this medicine, talk to your doctor, pharmacist, or health care provider.  2015, Elsevier/Gold Standard. (2009-11-13 17:27:34)  

## 2014-01-25 NOTE — Progress Notes (Signed)
[place]  Telephone:(336) [phone] Fax:(336) [phone]  ID: [name] OB: 17-Feb-1971 MR#: [id number]#:[id number] Patient Care Team: Baruch Goldmann, PA-C as PCP - General (Physician Assistant)  DIAGNOSIS: Chronic myeloproliferative syndrome-JAK2 negative  INTERVAL HISTORY: Ms. Teresi is here today for a follow-up. She is feeling ok. She had some nausea earlier in the week. She thinks she may have had a virus.  She is going to try and go back to work on November 11th. She will be working less than 30 hrs a week to start with. She denies fever, chills, n/v, cough, rash, headache, dizziness, SOB, chest pain, palpitations, abdominal pain, constipation, diarrhea, blood in urine or stool. She has had no swelling, tenderness, numbness or tingling in her extremities. She has had no bleeding or pain. Her appetite is good and she is drinking plenty of fluids. She has a small redd spot on her nose that won't heal. She said her grandmother had one similar to hers on her nose a year or so ago and it was cancerous. We will refer her to Dr. Allyson Sabal with dermatology today.   CURRENT TREATMENT: PEG-Interferon injection weekly  REVIEW OF SYSTEMS: All other 10 point review of systems is negative except for those issues mentioned above.  PAST MEDICAL HISTORY: Past Medical History  Diagnosis Date  . Glaucoma   . Arthritis   . Leukocytosis, unspecified 11/15/2012  . Chronic neutrophilia 11/15/2012  . Pruritus 11/15/2012  . Monocytosis 11/15/2012  . Myeloproliferative disorder 03/08/2013   PAST SURGICAL HISTORY: Past Surgical History  Procedure Laterality Date  . Tubal ligation  2000  . Eye surgery      laser eye surgery   FAMILY HISTORY No family history on file.  GYNECOLOGIC HISTORY:  No LMP recorded.   SOCIAL HISTORY:  History   Social History  . Marital Status: Divorced    Spouse Name: N/A    Number of Children: N/A  . Years of Education: N/A   Occupational History  .  Not on file.   Social History Main Topics  . Smoking status: Never Smoker   . Smokeless tobacco: Never Used     Comment: never used tobacco  . Alcohol Use: No  . Drug Use: Not on file  . Sexual Activity: Not on file   Other Topics Concern  . Not on file   Social History Narrative   ADVANCED DIRECTIVES: <no information>  HEALTH MAINTENANCE: History  Substance Use Topics  . Smoking status: Never Smoker   . Smokeless tobacco: Never Used     Comment: never used tobacco  . Alcohol Use: No   Colonoscopy: PAP: Bone density: Lipid panel:  No Known Allergies  Current Outpatient Prescriptions  Medication Sig Dispense Refill  . doxepin (SINEQUAN) 50 MG capsule TAKE 1 CAPSULE BY MOUTH AT BEDTIME 30 capsule 3  . folic acid (FOLVITE) 1 MG tablet Take 2 mg by mouth daily.    . INTERFERON ALFA-2A [place] Inject into the skin as needed.     Marland Kitchen MELATONIN PO Take 1 tablet by mouth at bedtime as needed (FOR SLEEP).     No current facility-administered medications for this visit.   OBJECTIVE: Filed Vitals:   01/25/14 1435  BP: 111/78  Pulse: 83  Temp: 97.7 F (36.5 C)  Resp: 83   Body mass index is 38.94 kg/(m^2). ECOG FS:0 - Asymptomatic Ocular: Sclerae unicteric, pupils equal, round and reactive to light Ear-nose-throat: Oropharynx clear, dentition fair Lymphatic: No cervical or supraclavicular adenopathy Lungs  no rales or rhonchi, good excursion bilaterally Heart regular rate and rhythm, no murmur appreciated Abd soft, nontender, positive bowel sounds MSK no focal spinal tenderness, no joint edema Neuro: non-focal, well-oriented, appropriate affect Breasts: Deferred  LAB RESULTS: CMP     Component Value Date/Time   NA 134 12/28/2013 1418   NA 134* 11/20/2013 1116   K 3.8 12/28/2013 1418   K 4.1 11/20/2013 1116   CL 101 12/28/2013 1418   CL 102 11/20/2013 1116   CO2 23 12/28/2013 1418   CO2 23 11/20/2013 1116   GLUCOSE 81 12/28/2013 1418   GLUCOSE 93 11/20/2013 1116    BUN 9 12/28/2013 1418   BUN 9 11/20/2013 1116   CREATININE 0.6 12/28/2013 1418   CREATININE 0.68 11/20/2013 1116   CALCIUM 10.0 12/28/2013 1418   CALCIUM 9.9 11/20/2013 1116   PROT 7.3 12/28/2013 1418   PROT 6.9 11/20/2013 1116   ALBUMIN 4.3 11/20/2013 1116   AST 14 12/28/2013 1418   AST 13 11/20/2013 1116   ALT 12 12/28/2013 1418   ALT 9 11/20/2013 1116   ALKPHOS 141* 12/28/2013 1418   ALKPHOS 123* 11/20/2013 1116   BILITOT 0.60 12/28/2013 1418   BILITOT 0.6 11/20/2013 1116   GFRNONAA >60 05/29/2010 1850   GFRAA  05/29/2010 1850    >60        The eGFR has been calculated using the MDRD equation. This calculation has not been validated in all clinical situations. eGFR's persistently <60 mL/min signify possible Chronic Kidney Disease.   No results found for: SPEP Lab Results  Component Value Date   WBC 19.8* 01/25/2014   NEUTROABS 15.7* 01/25/2014   HGB 11.7 01/25/2014   HCT 36.6 01/25/2014   MCV 111* 01/25/2014   PLT 227 01/25/2014   No results found for: LABCA2 No components found for: [id number] No results for input(s): INR in the last 168 hours.  STUDIES: None  ASSESSMENT/PLAN: Ms. Kindel is 43 year old female with myeloproliferative syndrome. She is doing well on the interferon injections. Decreasing the dose has improved the fatigue. She will get her injection today as planned. Her CBC today was ok, we will continue to monitor her blood counts.  We will give her a new schedule today. We will continue with weekly injections.  We will see her in 1 month for a follow-up and labs.  We referred her to Dr. Allyson Sabal with dermatology for the place on her nose.  We also gave her a note giving her permission to return to work at no more than 30 hours a week. She knows to cal here with any questions or concerns and to go to the ED in the event of an emergency. We can certainly see her sooner if need be.   Eliezer Bottom, NP 01/25/2014 3:05 PM

## 2014-01-28 ENCOUNTER — Telehealth: Payer: Self-pay | Admitting: Hematology & Oncology

## 2014-01-28 NOTE — Telephone Encounter (Signed)
Faxed medical records today to: [place] P: [phone] F: [phone]  Case: [phone]  [facility] for [facility] 10/30/2013      COPY SCANNED

## 2014-01-29 ENCOUNTER — Encounter: Payer: Self-pay | Admitting: *Deleted

## 2014-02-01 ENCOUNTER — Other Ambulatory Visit (HOSPITAL_BASED_OUTPATIENT_CLINIC_OR_DEPARTMENT_OTHER): Payer: BC Managed Care – PPO | Admitting: Lab

## 2014-02-01 ENCOUNTER — Ambulatory Visit (HOSPITAL_BASED_OUTPATIENT_CLINIC_OR_DEPARTMENT_OTHER): Payer: BC Managed Care – PPO

## 2014-02-01 DIAGNOSIS — D471 Chronic myeloproliferative disease: Secondary | ICD-10-CM

## 2014-02-01 DIAGNOSIS — D72829 Elevated white blood cell count, unspecified: Secondary | ICD-10-CM

## 2014-02-01 LAB — CBC WITH DIFFERENTIAL (CANCER CENTER ONLY)
BASO#: 0 10*3/uL (ref 0.0–0.2)
BASO%: 0.1 % (ref 0.0–2.0)
EOS%: 0.3 % (ref 0.0–7.0)
Eosinophils Absolute: 0.1 10*3/uL (ref 0.0–0.5)
HCT: 36.1 % (ref 34.8–46.6)
HGB: 11.7 g/dL (ref 11.6–15.9)
LYMPH#: 2.7 10*3/uL (ref 0.9–3.3)
LYMPH%: 14.9 % (ref 14.0–48.0)
MCH: 35.2 pg — ABNORMAL HIGH (ref 26.0–34.0)
MCHC: 32.4 g/dL (ref 32.0–36.0)
MCV: 109 fL — AB (ref 81–101)
MONO#: 0.8 10*3/uL (ref 0.1–0.9)
MONO%: 4.2 % (ref 0.0–13.0)
NEUT#: 14.8 10*3/uL — ABNORMAL HIGH (ref 1.5–6.5)
NEUT%: 80.5 % — ABNORMAL HIGH (ref 39.6–80.0)
Platelets: 199 10*3/uL (ref 145–400)
RBC: 3.32 10*6/uL — ABNORMAL LOW (ref 3.70–5.32)
RDW: 14.9 % (ref 11.1–15.7)
WBC: 18.4 10*3/uL — ABNORMAL HIGH (ref 3.9–10.0)

## 2014-02-01 MED ORDER — PEGINTERFERON ALFA-2A 180 MCG/ML ~~LOC~~ SOLN: 81.0000 ug | SUBCUTANEOUS | Status: DC

## 2014-02-01 MED ORDER — ACETAMINOPHEN 325 MG PO TABS: 650.0000 mg | ORAL_TABLET | Freq: Once | ORAL | Status: DC

## 2014-02-01 MED ORDER — PEGINTERFERON ALFA-2A 180 MCG/0.5ML ~~LOC~~ KIT: 81.0000 ug | PACK | Freq: Once | SUBCUTANEOUS | Status: DC

## 2014-02-01 MED ORDER — PEGINTERFERON ALFA-2A 180 MCG/ML ~~LOC~~ SOLN
81.0000 ug | Freq: Once | SUBCUTANEOUS | Status: AC
  Administered 2014-02-01: 81 ug via SUBCUTANEOUS
  Filled 2014-02-01: qty 0.45

## 2014-02-01 NOTE — Patient Instructions (Signed)
Interferon Alfa-2a injection What is this medicine? INTERFERON ALFA-2a (in ter FEER on AL fa 2 a) (Roferon-A) helps the immune system fight viral infections such as chronic hepatitis C, and certain cancers like Philadelphia chromosome positive chronic myelogenous leukemia, and hairy cell leukemia. This medicine may be used for other purposes; ask your health care provider or pharmacist if you have questions. COMMON BRAND NAME(S): Roferon A What should I tell my health care provider before I take this medicine? They need to know if you have any of these conditions: -alcoholism or other drug abuse or addiction -autoimmune disease like psoriasis, Raynaud's phenomenon, rheumatoid arthritis, or systemic lupus erythematosus -blood or bleeding disorders -depression or mental disorders -diabetes -heart disease -high blood pressure -immune system problems like HIV infection -kidney disease -liver disease -lung disease -seizures -stomach problems -thyroid disease -transplant recipient -an unusual or allergic reaction to interferons, other medicines, benzyl alcohol, E. coli proteins, foods, dyes, or preservatives -pregnant or trying to get pregnant -breast-feeding How should I use this medicine? The medicine is for injection under the skin. Do not shake the prefilled syringes. Shaking can destroy the medicine. If you are giving yourself the injections, make sure you follow the directions carefully. If you have any questions about how to give your medicine, call your health care provider. If you experience flu-like effects, inject the dose at bedtime to decrease these effects. Do not reuse syringes or needles. It is important that you put your used needles and syringes in a special sharps container. Do not put them in a trash can. If you do not have a sharps container, call your pharmacist or healthcare provider to get one. A special MedGuide will be given to you by the pharmacist with each  prescription and refill. Be sure to read this information carefully each time. Talk to your pediatrician regarding the use of this medicine in children. Special care may be needed. Patients over 65 years old may have a stronger reaction and need a smaller dose. Overdosage: If you think you have taken too much of this medicine contact a poison control center or emergency room at once. NOTE: This medicine is only for you. Do not share this medicine with others. What if I miss a dose? If you miss a dose, take it as soon as you can. If it is almost time for your next dose, take only that dose. Do not take double or extra doses or take more than one dose in a day unless told to do so by your doctor. If you forget a dose for more than 2 days, call your health care professional. If you accidentally take too much, call your doctor immediately. What may interact with this medicine? -alcohol -antiviral medicines for HIV or AIDS -theophylline This list may not describe all possible interactions. Give your health care provider a list of all the medicines, herbs, non-prescription drugs, or dietary supplements you use. Also tell them if you smoke, drink alcohol, or use illegal drugs. Some items may interact with your medicine. What should I watch for while using this medicine? Visit your doctor or health care professional for regular checks on your progress. You will need regular blood checks. Do not change brands of medicine without consulting your doctor or health care professional. Different brands of medicine can act differently in your body. Check with your pharmacist if your refills do not look like your original product. You may get drowsy or dizzy. Do not drive, use machinery, or do anything that   needs mental alertness until you know how this medicine affects you. Alcohol can make you more drowsy or dizzy, increase confusion and lightheadedness. Avoid alcoholic drinks. This medicine can cause flu-like  symptoms and make you feel generally unwell. Report any side effects, but continue your medicine even though you feel ill, unless your doctor or health care professional tells you to stop. If you get a fever or sore throat that does not go away after the first few weeks of treatment, do not treat yourself. Call your doctor or health care professional as soon as you can if you think you have an infection. Other signs of infection include cough, lower back or side pain, and pain or difficulty passing urine. Do not become pregnant while taking this medicine. Women should inform their doctor if they wish to become pregnant or think they might be pregnant. There is a potential for serious side effects to an unborn child. Talk to your health care professional or pharmacist for more information. Do not breast-feed an infant while taking this medicine. This medicine can cause blood problems and may increase your risk to bruise or bleed. Call your doctor or health care professional if you notice any unusual bleeding. Be careful not to cut, bruise, or injure yourself because you may get an infection and bleed more than usual. Be careful brushing and flossing your teeth or using a toothpick while receiving this medicine because you may get an infection or bleed more easily. If you have any dental work done, tell your dentist you are receiving this medicine. What side effects may I notice from receiving this medicine? Side effects that you should report to your doctor or health care professional as soon as possible: -allergic reactions like skin rash, itching or hives, swelling of the face, lips, or tongue -breathing problems -changes in vision -chest pain or palpitations -depression -fast, irregular heartbeat -seizures -severe abdominal pain -signs of infection - fever or chills, cough, sore throat, pain or difficulty passing urine -unusual bleeding or bruising -unusually weak or tired -yellowing of the eyes  or skin Side effects that usually do not require medical attention (report to your doctor or health care professional if they continue or are bothersome): -hair loss -headaches -joint, leg, muscle, or back aches -nausea, vomiting -tiredness -weight loss This list may not describe all possible side effects. Call your doctor for medical advice about side effects. You may report side effects to FDA at 1-800-FDA-1088. Where should I keep my medicine? Keep out of the reach of children. Store in a refrigerator between 2 and 8 degrees C (36 and 46 degrees F). Do not freeze or expose to light. Throw away any unused medicine after the expiration date. NOTE: This sheet is a summary. It may not cover all possible information. If you have questions about this medicine, talk to your doctor, pharmacist, or health care provider.  2015, Elsevier/Gold Standard. (2007-07-25 16:04:33)  

## 2014-02-08 ENCOUNTER — Ambulatory Visit (HOSPITAL_BASED_OUTPATIENT_CLINIC_OR_DEPARTMENT_OTHER): Payer: BC Managed Care – PPO

## 2014-02-08 ENCOUNTER — Other Ambulatory Visit (HOSPITAL_BASED_OUTPATIENT_CLINIC_OR_DEPARTMENT_OTHER): Payer: BC Managed Care – PPO | Admitting: Lab

## 2014-02-08 DIAGNOSIS — D471 Chronic myeloproliferative disease: Secondary | ICD-10-CM

## 2014-02-08 DIAGNOSIS — D72829 Elevated white blood cell count, unspecified: Secondary | ICD-10-CM

## 2014-02-08 LAB — CBC WITH DIFFERENTIAL (CANCER CENTER ONLY)
BASO#: 0 10*3/uL (ref 0.0–0.2)
BASO%: 0.1 % (ref 0.0–2.0)
EOS%: 0.3 % (ref 0.0–7.0)
Eosinophils Absolute: 0.1 10*3/uL (ref 0.0–0.5)
HEMATOCRIT: 34.7 % — AB (ref 34.8–46.6)
HGB: 11.3 g/dL — ABNORMAL LOW (ref 11.6–15.9)
LYMPH#: 2.9 10*3/uL (ref 0.9–3.3)
LYMPH%: 12.6 % — ABNORMAL LOW (ref 14.0–48.0)
MCH: 34.7 pg — ABNORMAL HIGH (ref 26.0–34.0)
MCHC: 32.6 g/dL (ref 32.0–36.0)
MCV: 106 fL — ABNORMAL HIGH (ref 81–101)
MONO#: 1 10*3/uL — ABNORMAL HIGH (ref 0.1–0.9)
MONO%: 4.5 % (ref 0.0–13.0)
NEUT%: 82.5 % — ABNORMAL HIGH (ref 39.6–80.0)
NEUTROS ABS: 18.7 10*3/uL — AB (ref 1.5–6.5)
Platelets: 201 10*3/uL (ref 145–400)
RBC: 3.26 10*6/uL — ABNORMAL LOW (ref 3.70–5.32)
RDW: 14.9 % (ref 11.1–15.7)
WBC: 22.7 10*3/uL — ABNORMAL HIGH (ref 3.9–10.0)

## 2014-02-08 MED ORDER — ACETAMINOPHEN 325 MG PO TABS: 650.0000 mg | ORAL_TABLET | Freq: Once | ORAL | Status: DC

## 2014-02-08 MED ORDER — PEGINTERFERON ALFA-2A 180 MCG/ML ~~LOC~~ SOLN
81.0000 ug | SUBCUTANEOUS | Status: DC
  Administered 2014-02-08: 81 ug via SUBCUTANEOUS
  Filled 2014-02-08: qty 0.45

## 2014-02-08 MED ORDER — PEGINTERFERON ALFA-2A 180 MCG/ML ~~LOC~~ SOLN
81.0000 ug | SUBCUTANEOUS | Status: DC
  Filled 2014-02-08: qty 0.45

## 2014-02-08 MED ORDER — PEGINTERFERON ALFA-2A 180 MCG/0.5ML ~~LOC~~ KIT: 81.0000 ug | PACK | Freq: Once | SUBCUTANEOUS | Status: DC

## 2014-02-08 NOTE — Patient Instructions (Signed)
Peginterferon Alfa-2a Injection What is this medicine? PEGINTERFERON ALFA-2a (peg in ter FEER on AL fa 2 a) is a man-made drug that acts like a protein made by the body. It is used to treat chronic hepatitis B and C infections. This medicine may be used for other purposes; ask your health care provider or pharmacist if you have questions. COMMON BRAND NAME(S): Pegasys, Pegasys ProClick What should I tell my health care provider before I take this medicine? They need to know if you have any of these conditions: -alcoholism -auto-immune hepatitis -blood or bleeding disorders -colitis like ulcerative colitis or Crohn's disease -depression or other mental disorders -diabetes -drug abuse or addiction -heart disease -history of cancer -kidney disease -lupus -psoriasis -rheumatoid arthritis -thyroid problems -an unusual or allergic reaction to peginterferon, other medicines, foods, dyes, or preservatives like benzyl alcohol -pregnant or trying to get pregnant -breast-feeding How should I use this medicine? This medicine is for injection under the skin. Do NOT shake this medicine. You will be taught how to prepare and give this medicine. Use exactly as directed. Take your medicine at regular intervals. Do not take your medicine more often than directed. It is important that you put your used needles and syringes in a special sharps container. Do not put them in a trash can. If you do not have a sharps container, call your pharmacist or healthcare provider to get one. A special MedGuide will be given to you by the pharmacist with each prescription and refill. Be sure to read this information carefully each time. Talk to your pediatrician regarding the use of this medicine in children. While this drug may be prescribed for children as young as 5 years for selected conditions, precautions do apply. Overdosage: If you think you have taken too much of this medicine contact a poison control center or  emergency room at once. NOTE: This medicine is only for you. Do not share this medicine with others. What if I miss a dose? If you miss a dose and you remember within 2 days of when you missed your dose, take it as soon as you can. If it is late in the day, wait until the next day to take your dose. If more than 2 days have passed since you missed your dose ask your doctor what to do. Do not take double or extra doses. What may interact with this medicine? -alcohol -medicines for HIV like didanosine, lamivudine, stavudine, zidovudine -methadone -theophylline This list may not describe all possible interactions. Give your health care provider a list of all the medicines, herbs, non-prescription drugs, or dietary supplements you use. Also tell them if you smoke, drink alcohol, or use illegal drugs. Some items may interact with your medicine. What should I watch for while using this medicine? Visit your doctor or health care professional for regular checks on your progress. You will need regular blood checks. You may get drowsy or dizzy. Do not drive, use machinery, or do anything that needs mental alertness until you know how this medicine affects you. Do not stand or sit up quickly, especially if you are an older patient. This reduces the risk of dizzy or fainting spells. Alcohol may interfere with the effect of this medicine. Avoid alcoholic drinks. What side effects may I notice from receiving this medicine? Side effects that you should report to your doctor or health care professional as soon as possible: -allergic reactions like skin rash, itching or hives, swelling of the face, lips, or tongue -bloody   diarrhea -breathing problems -change in blood sugar -changes in vision -chest pain -confusion, trouble speaking or understanding -fast, irregular heartbeat -fever -high blood pressure -increased anger, depression, irritability, or thoughts of suicide -pain in lower back or stomach -pain,  tingling, numbness in the hands or feet -sudden numbness or weakness of the face, arm or leg -trouble passing urine or change in the amount of urine -trouble walking, dizziness, loss of balance or coordination -unusual bleeding or bruising Side effects that usually do not require medical attention (report to your doctor or health care professional if they continue or are bothersome): -aches, pains -dry, itchy skin -hair loss -loss of appetite -nausea, stomach upset -pain or swelling at site where injected -trouble sleeping -unusually weak or tired This list may not describe all possible side effects. Call your doctor for medical advice about side effects. You may report side effects to FDA at 1-800-FDA-1088. Where should I keep my medicine? Keep out of the reach of children. Store in a refrigerator between 2 and 8 degrees C (36 and 46 degrees F). Do not freeze. Protect from light. Do not leave this medicine out of the refrigerator for more than 24 hours. Throw away any unused medicine after the expiration date. NOTE: This sheet is a summary. It may not cover all possible information. If you have questions about this medicine, talk to your doctor, pharmacist, or health care provider.  2015, Elsevier/Gold Standard. (2009-11-13 17:27:34)  

## 2014-02-15 ENCOUNTER — Other Ambulatory Visit (HOSPITAL_BASED_OUTPATIENT_CLINIC_OR_DEPARTMENT_OTHER): Payer: BC Managed Care – PPO | Admitting: Lab

## 2014-02-15 ENCOUNTER — Encounter: Payer: Self-pay | Admitting: Hematology & Oncology

## 2014-02-15 ENCOUNTER — Ambulatory Visit (HOSPITAL_BASED_OUTPATIENT_CLINIC_OR_DEPARTMENT_OTHER): Payer: BC Managed Care – PPO | Admitting: Hematology & Oncology

## 2014-02-15 ENCOUNTER — Ambulatory Visit (HOSPITAL_BASED_OUTPATIENT_CLINIC_OR_DEPARTMENT_OTHER): Payer: BC Managed Care – PPO

## 2014-02-15 VITALS — BP 136/78 | HR 76 | Temp 97.6°F | Resp 20 | Wt 234.0 lb

## 2014-02-15 DIAGNOSIS — D471 Chronic myeloproliferative disease: Secondary | ICD-10-CM

## 2014-02-15 DIAGNOSIS — D72829 Elevated white blood cell count, unspecified: Secondary | ICD-10-CM

## 2014-02-15 DIAGNOSIS — M25551 Pain in right hip: Secondary | ICD-10-CM

## 2014-02-15 DIAGNOSIS — G8929 Other chronic pain: Secondary | ICD-10-CM

## 2014-02-15 LAB — CBC WITH DIFFERENTIAL (CANCER CENTER ONLY)
BASO#: 0 10*3/uL (ref 0.0–0.2)
BASO%: 0 % (ref 0.0–2.0)
EOS ABS: 0.1 10*3/uL (ref 0.0–0.5)
EOS%: 0.3 % (ref 0.0–7.0)
HEMATOCRIT: 34.5 % — AB (ref 34.8–46.6)
HEMOGLOBIN: 11.2 g/dL — AB (ref 11.6–15.9)
LYMPH#: 3 10*3/uL (ref 0.9–3.3)
LYMPH%: 14 % (ref 14.0–48.0)
MCH: 34.1 pg — ABNORMAL HIGH (ref 26.0–34.0)
MCHC: 32.5 g/dL (ref 32.0–36.0)
MCV: 105 fL — ABNORMAL HIGH (ref 81–101)
MONO#: 1.2 10*3/uL — AB (ref 0.1–0.9)
MONO%: 5.6 % (ref 0.0–13.0)
NEUT%: 80.1 % — AB (ref 39.6–80.0)
NEUTROS ABS: 16.8 10*3/uL — AB (ref 1.5–6.5)
Platelets: 189 10*3/uL (ref 145–400)
RBC: 3.28 10*6/uL — AB (ref 3.70–5.32)
RDW: 14.9 % (ref 11.1–15.7)
WBC: 21 10*3/uL — ABNORMAL HIGH (ref 3.9–10.0)

## 2014-02-15 MED ORDER — PEGINTERFERON ALFA-2A 180 MCG/0.5ML ~~LOC~~ KIT: 81.0000 ug | PACK | Freq: Once | SUBCUTANEOUS | Status: DC

## 2014-02-15 MED ORDER — PEGINTERFERON ALFA-2A 180 MCG/ML ~~LOC~~ SOLN
81.0000 ug | SUBCUTANEOUS | Status: DC
  Administered 2014-02-15: 81 ug via SUBCUTANEOUS
  Filled 2014-02-15: qty 0.45

## 2014-02-15 MED ORDER — PEGINTERFERON ALFA-2A 180 MCG/0.5ML ~~LOC~~ SOLN: 81.0000 ug | Freq: Once | SUBCUTANEOUS | Status: DC

## 2014-02-15 NOTE — Patient Instructions (Signed)
Peginterferon Alfa-2a Injection What is this medicine? PEGINTERFERON ALFA-2a (peg in ter FEER on AL fa 2 a) is a man-made drug that acts like a protein made by the body. It is used to treat chronic hepatitis B and C infections. This medicine may be used for other purposes; ask your health care provider or pharmacist if you have questions. COMMON BRAND NAME(S): Pegasys, Pegasys ProClick What should I tell my health care provider before I take this medicine? They need to know if you have any of these conditions: -alcoholism -auto-immune hepatitis -blood or bleeding disorders -colitis like ulcerative colitis or Crohn's disease -depression or other mental disorders -diabetes -drug abuse or addiction -heart disease -history of cancer -kidney disease -lupus -psoriasis -rheumatoid arthritis -thyroid problems -an unusual or allergic reaction to peginterferon, other medicines, foods, dyes, or preservatives like benzyl alcohol -pregnant or trying to get pregnant -breast-feeding How should I use this medicine? This medicine is for injection under the skin. Do NOT shake this medicine. You will be taught how to prepare and give this medicine. Use exactly as directed. Take your medicine at regular intervals. Do not take your medicine more often than directed. It is important that you put your used needles and syringes in a special sharps container. Do not put them in a trash can. If you do not have a sharps container, call your pharmacist or healthcare provider to get one. A special MedGuide will be given to you by the pharmacist with each prescription and refill. Be sure to read this information carefully each time. Talk to your pediatrician regarding the use of this medicine in children. While this drug may be prescribed for children as young as 5 years for selected conditions, precautions do apply. Overdosage: If you think you have taken too much of this medicine contact a poison control center or  emergency room at once. NOTE: This medicine is only for you. Do not share this medicine with others. What if I miss a dose? If you miss a dose and you remember within 2 days of when you missed your dose, take it as soon as you can. If it is late in the day, wait until the next day to take your dose. If more than 2 days have passed since you missed your dose ask your doctor what to do. Do not take double or extra doses. What may interact with this medicine? -alcohol -medicines for HIV like didanosine, lamivudine, stavudine, zidovudine -methadone -theophylline This list may not describe all possible interactions. Give your health care provider a list of all the medicines, herbs, non-prescription drugs, or dietary supplements you use. Also tell them if you smoke, drink alcohol, or use illegal drugs. Some items may interact with your medicine. What should I watch for while using this medicine? Visit your doctor or health care professional for regular checks on your progress. You will need regular blood checks. You may get drowsy or dizzy. Do not drive, use machinery, or do anything that needs mental alertness until you know how this medicine affects you. Do not stand or sit up quickly, especially if you are an older patient. This reduces the risk of dizzy or fainting spells. Alcohol may interfere with the effect of this medicine. Avoid alcoholic drinks. What side effects may I notice from receiving this medicine? Side effects that you should report to your doctor or health care professional as soon as possible: -allergic reactions like skin rash, itching or hives, swelling of the face, lips, or tongue -bloody   diarrhea -breathing problems -change in blood sugar -changes in vision -chest pain -confusion, trouble speaking or understanding -fast, irregular heartbeat -fever -high blood pressure -increased anger, depression, irritability, or thoughts of suicide -pain in lower back or stomach -pain,  tingling, numbness in the hands or feet -sudden numbness or weakness of the face, arm or leg -trouble passing urine or change in the amount of urine -trouble walking, dizziness, loss of balance or coordination -unusual bleeding or bruising Side effects that usually do not require medical attention (report to your doctor or health care professional if they continue or are bothersome): -aches, pains -dry, itchy skin -hair loss -loss of appetite -nausea, stomach upset -pain or swelling at site where injected -trouble sleeping -unusually weak or tired This list may not describe all possible side effects. Call your doctor for medical advice about side effects. You may report side effects to FDA at 1-800-FDA-1088. Where should I keep my medicine? Keep out of the reach of children. Store in a refrigerator between 2 and 8 degrees C (36 and 46 degrees F). Do not freeze. Protect from light. Do not leave this medicine out of the refrigerator for more than 24 hours. Throw away any unused medicine after the expiration date. NOTE: This sheet is a summary. It may not cover all possible information. If you have questions about this medicine, talk to your doctor, pharmacist, or health care provider.  2015, Elsevier/Gold Standard. (2009-11-13 17:27:34)  

## 2014-02-18 ENCOUNTER — Telehealth: Payer: Self-pay | Admitting: Hematology & Oncology

## 2014-02-18 ENCOUNTER — Other Ambulatory Visit: Payer: Self-pay | Admitting: *Deleted

## 2014-02-18 DIAGNOSIS — D471 Chronic myeloproliferative disease: Secondary | ICD-10-CM

## 2014-02-18 NOTE — Telephone Encounter (Signed)
Left pt message to call for appointments added to 12-4

## 2014-02-18 NOTE — Progress Notes (Signed)
Hematology and Oncology Follow Up Visit  Brianna Fletcher [id number] 04-Jan-1971 43 y.o. 02/18/2014   Principle Diagnosis:  Chronic myeloproliferative syndrome-JAK2 negative  Current Therapy:   Peg-Interferon q weekly     Interim History:  [name] is back for followup. She has tolerated the interferon well. She is not complaining of pruritus. She still has some discomfort over in the right hip. This is been looked at already. Is been about a year however.  I sent off a comprehensive genetic panel for myeloproliferative disorders. This is come back normal.  We repeated another bone marrow test on her. This bases showed a hypercellular marrow. No discrete or defined disorder was noted. This was similar to the bone marrow that she had done back in 2014.  She is working. She does get tired at work. However, she is doing her best.  [name] had no rashes. [name] had no fever. Her appetite has been good. There has been no change in bowel or bladder habits.  She had a good Thanksgiving.  Overall, her performance status is ECOG 1. Medications: Current outpatient prescriptions: doxepin (SINEQUAN) 50 MG capsule, TAKE 1 CAPSULE BY MOUTH AT BEDTIME, Disp: 30 capsule, Rfl: 3;  folic acid (FOLVITE) 1 MG tablet, Take 2 mg by mouth daily., Disp: , Rfl: ;  INTERFERON ALFA-2A [place], Inject into the skin as needed. , Disp: , Rfl: ;  MELATONIN PO, Take 1 tablet by mouth at bedtime as needed (FOR SLEEP)., Disp: , Rfl:   Allergies: No Known Allergies  Past Medical History, Surgical history, Social history, and Family History were reviewed and updated.  Review of Systems: As above  Physical Exam:  weight is 234 lb (106.142 kg). Her temperature is 97.6 F (36.4 C). Her blood pressure is 136/78 and her pulse is 76. Her respiration is 20.   Well-developed and well-nourished white female. Head and neck exam shows no ocular or oral lesions. She has no palpable cervical or supraclavicular lymph nodes. Lungs  are clear. Cardiac exam regular rate and rhythm with no murmurs rubs or bruits. Abdomen is soft. She has good bowel sounds. There is no fluid wave. There is no palpable liver or spleen tip. Back exam shows no tenderness over the spine or ribs. There is some tenderness over the right hip area. She has some slight decreased range of motion of the right hip. Extremities shows no clubbing, cyanosis or edema. Skin exam no rashes, ecchymosis or petechia. Neurological exam shows no focal neurological deficits.  Lab Results  Component Value Date   WBC 21.0* 02/15/2014   HGB 11.2* 02/15/2014   HCT 34.5* 02/15/2014   MCV 105* 02/15/2014   PLT 189 02/15/2014     Chemistry      Component Value Date/Time   NA 134 12/28/2013 1418   NA 134* 11/20/2013 1116   K 3.8 12/28/2013 1418   K 4.1 11/20/2013 1116   CL 101 12/28/2013 1418   CL 102 11/20/2013 1116   CO2 23 12/28/2013 1418   CO2 23 11/20/2013 1116   BUN 9 12/28/2013 1418   BUN 9 11/20/2013 1116   CREATININE 0.6 12/28/2013 1418   CREATININE 0.68 11/20/2013 1116      Component Value Date/Time   CALCIUM 10.0 12/28/2013 1418   CALCIUM 9.9 11/20/2013 1116   ALKPHOS 141* 12/28/2013 1418   ALKPHOS 123* 11/20/2013 1116   AST 14 12/28/2013 1418   AST 13 11/20/2013 1116   ALT 12 12/28/2013 1418   ALT 9  11/20/2013 1116   BILITOT 0.60 12/28/2013 1418   BILITOT 0.6 11/20/2013 1116         Impression and Plan: Brianna Fletcher is 43 year old female. She is this myeloproliferative syndrome. So far, our workup has been totally negative. I'm glad that the interferon is keeping her white cell count "in check". Her blood smear surgery does not look unusual outside of the increased in myeloid cells. There appear mature.  I will repeat a scan of the right hip. It sounds like she may have some bursitis in that area.  We will continue the interferon weekly. Again, she is tolerating this pretty well.  I will plan to see her back in about 4-6 weeks.  I  spent about 30 minutes with her.   [name], MD 11/30/20156:52 AM

## 2014-02-22 ENCOUNTER — Ambulatory Visit (HOSPITAL_BASED_OUTPATIENT_CLINIC_OR_DEPARTMENT_OTHER): Payer: BC Managed Care – PPO

## 2014-02-22 ENCOUNTER — Other Ambulatory Visit (HOSPITAL_BASED_OUTPATIENT_CLINIC_OR_DEPARTMENT_OTHER): Payer: BC Managed Care – PPO | Admitting: Lab

## 2014-02-22 ENCOUNTER — Ambulatory Visit (HOSPITAL_BASED_OUTPATIENT_CLINIC_OR_DEPARTMENT_OTHER)
Admission: RE | Admit: 2014-02-22 | Discharge: 2014-02-22 | Disposition: A | Discharge status: Home / Self Care | Payer: BC Managed Care – PPO | Source: Ambulatory Visit | Attending: Hematology & Oncology | Admitting: Hematology & Oncology

## 2014-02-22 DIAGNOSIS — D471 Chronic myeloproliferative disease: Secondary | ICD-10-CM

## 2014-02-22 DIAGNOSIS — M1611 Unilateral primary osteoarthritis, right hip: Secondary | ICD-10-CM | POA: Insufficient documentation

## 2014-02-22 DIAGNOSIS — G8929 Other chronic pain: Secondary | ICD-10-CM

## 2014-02-22 DIAGNOSIS — M25551 Pain in right hip: Secondary | ICD-10-CM | POA: Diagnosis present

## 2014-02-22 DIAGNOSIS — D72829 Elevated white blood cell count, unspecified: Secondary | ICD-10-CM

## 2014-02-22 LAB — CBC WITH DIFFERENTIAL (CANCER CENTER ONLY)
BASO#: 0 10*3/uL (ref 0.0–0.2)
BASO%: 0.1 % (ref 0.0–2.0)
EOS%: 0.2 % (ref 0.0–7.0)
Eosinophils Absolute: 0 10*3/uL (ref 0.0–0.5)
HEMATOCRIT: 36 % (ref 34.8–46.6)
HGB: 11.7 g/dL (ref 11.6–15.9)
LYMPH#: 2.3 10*3/uL (ref 0.9–3.3)
LYMPH%: 11.9 % — ABNORMAL LOW (ref 14.0–48.0)
MCH: 33.3 pg (ref 26.0–34.0)
MCHC: 32.5 g/dL (ref 32.0–36.0)
MCV: 103 fL — AB (ref 81–101)
MONO#: 0.9 10*3/uL (ref 0.1–0.9)
MONO%: 4.5 % (ref 0.0–13.0)
NEUT#: 16.4 10*3/uL — ABNORMAL HIGH (ref 1.5–6.5)
NEUT%: 83.3 % — ABNORMAL HIGH (ref 39.6–80.0)
Platelets: 214 10*3/uL (ref 145–400)
RBC: 3.51 10*6/uL — ABNORMAL LOW (ref 3.70–5.32)
RDW: 14.5 % (ref 11.1–15.7)
WBC: 19.7 10*3/uL — AB (ref 3.9–10.0)

## 2014-02-22 LAB — CMP (CANCER CENTER ONLY)
ALT: 17 U/L (ref 10–47)
AST: 20 U/L (ref 11–38)
Albumin: 3.4 g/dL (ref 3.3–5.5)
Alkaline Phosphatase: 134 U/L — ABNORMAL HIGH (ref 26–84)
BILIRUBIN TOTAL: 0.6 mg/dL (ref 0.20–1.60)
BUN, Bld: 12 mg/dL (ref 7–22)
CO2: 25 mEq/L (ref 18–33)
Calcium: 9.7 mg/dL (ref 8.0–10.3)
Chloride: 106 mEq/L (ref 98–108)
Creat: 0.8 mg/dl (ref 0.6–1.2)
Glucose, Bld: 120 mg/dL — ABNORMAL HIGH (ref 73–118)
Potassium: 3.6 mEq/L (ref 3.3–4.7)
Sodium: 140 mEq/L (ref 128–145)
Total Protein: 7.6 g/dL (ref 6.4–8.1)

## 2014-02-22 MED ORDER — PEGINTERFERON ALFA-2A 180 MCG/0.5ML ~~LOC~~ SOLN
81.0000 ug | Freq: Once | SUBCUTANEOUS | Status: DC
  Administered 2014-02-22: 81 ug via SUBCUTANEOUS

## 2014-02-22 MED ORDER — PEGINTERFERON ALFA-2A 180 MCG/0.5ML ~~LOC~~ KIT: 81.0000 ug | PACK | Freq: Once | SUBCUTANEOUS | Status: DC

## 2014-02-22 MED ORDER — ACETAMINOPHEN 325 MG PO TABS: 650.0000 mg | ORAL_TABLET | Freq: Once | ORAL | Status: DC

## 2014-02-22 MED ORDER — IOHEXOL 300 MG/ML  SOLN
100.0000 mL | Freq: Once | INTRAMUSCULAR | Status: AC | PRN
  Administered 2014-02-22: 100 mL via INTRAVENOUS

## 2014-02-22 MED ORDER — PEGINTERFERON ALFA-2A 180 MCG/ML ~~LOC~~ SOLN
81.0000 ug | SUBCUTANEOUS | Status: DC
  Filled 2014-02-22: qty 0.45

## 2014-02-22 NOTE — Patient Instructions (Signed)
Peginterferon Alfa-2a Injection What is this medicine? PEGINTERFERON ALFA-2a (peg in ter FEER on AL fa 2 a) is a man-made drug that acts like a protein made by the body. It is used to treat chronic hepatitis B and C infections. This medicine may be used for other purposes; ask your health care provider or pharmacist if you have questions. COMMON BRAND NAME(S): Pegasys, Pegasys ProClick What should I tell my health care provider before I take this medicine? They need to know if you have any of these conditions: -alcoholism -auto-immune hepatitis -blood or bleeding disorders -colitis like ulcerative colitis or Crohn's disease -depression or other mental disorders -diabetes -drug abuse or addiction -heart disease -history of cancer -kidney disease -lupus -psoriasis -rheumatoid arthritis -thyroid problems -an unusual or allergic reaction to peginterferon, other medicines, foods, dyes, or preservatives like benzyl alcohol -pregnant or trying to get pregnant -breast-feeding How should I use this medicine? This medicine is for injection under the skin. Do NOT shake this medicine. You will be taught how to prepare and give this medicine. Use exactly as directed. Take your medicine at regular intervals. Do not take your medicine more often than directed. It is important that you put your used needles and syringes in a special sharps container. Do not put them in a trash can. If you do not have a sharps container, call your pharmacist or healthcare provider to get one. A special MedGuide will be given to you by the pharmacist with each prescription and refill. Be sure to read this information carefully each time. Talk to your pediatrician regarding the use of this medicine in children. While this drug may be prescribed for children as young as 5 years for selected conditions, precautions do apply. Overdosage: If you think you have taken too much of this medicine contact a poison control center or  emergency room at once. NOTE: This medicine is only for you. Do not share this medicine with others. What if I miss a dose? If you miss a dose and you remember within 2 days of when you missed your dose, take it as soon as you can. If it is late in the day, wait until the next day to take your dose. If more than 2 days have passed since you missed your dose ask your doctor what to do. Do not take double or extra doses. What may interact with this medicine? -alcohol -medicines for HIV like didanosine, lamivudine, stavudine, zidovudine -methadone -theophylline This list may not describe all possible interactions. Give your health care provider a list of all the medicines, herbs, non-prescription drugs, or dietary supplements you use. Also tell them if you smoke, drink alcohol, or use illegal drugs. Some items may interact with your medicine. What should I watch for while using this medicine? Visit your doctor or health care professional for regular checks on your progress. You will need regular blood checks. You may get drowsy or dizzy. Do not drive, use machinery, or do anything that needs mental alertness until you know how this medicine affects you. Do not stand or sit up quickly, especially if you are an older patient. This reduces the risk of dizzy or fainting spells. Alcohol may interfere with the effect of this medicine. Avoid alcoholic drinks. What side effects may I notice from receiving this medicine? Side effects that you should report to your doctor or health care professional as soon as possible: -allergic reactions like skin rash, itching or hives, swelling of the face, lips, or tongue -bloody   diarrhea -breathing problems -change in blood sugar -changes in vision -chest pain -confusion, trouble speaking or understanding -fast, irregular heartbeat -fever -high blood pressure -increased anger, depression, irritability, or thoughts of suicide -pain in lower back or stomach -pain,  tingling, numbness in the hands or feet -sudden numbness or weakness of the face, arm or leg -trouble passing urine or change in the amount of urine -trouble walking, dizziness, loss of balance or coordination -unusual bleeding or bruising Side effects that usually do not require medical attention (report to your doctor or health care professional if they continue or are bothersome): -aches, pains -dry, itchy skin -hair loss -loss of appetite -nausea, stomach upset -pain or swelling at site where injected -trouble sleeping -unusually weak or tired This list may not describe all possible side effects. Call your doctor for medical advice about side effects. You may report side effects to FDA at 1-800-FDA-1088. Where should I keep my medicine? Keep out of the reach of children. Store in a refrigerator between 2 and 8 degrees C (36 and 46 degrees F). Do not freeze. Protect from light. Do not leave this medicine out of the refrigerator for more than 24 hours. Throw away any unused medicine after the expiration date. NOTE: This sheet is a summary. It may not cover all possible information. If you have questions about this medicine, talk to your doctor, pharmacist, or health care provider.  2015, Elsevier/Gold Standard. (2009-11-13 17:27:34)  

## 2014-02-25 ENCOUNTER — Other Ambulatory Visit: Payer: Self-pay

## 2014-02-25 DIAGNOSIS — Z1231 Encounter for screening mammogram for malignant neoplasm of breast: Secondary | ICD-10-CM

## 2014-02-25 MED FILL — Peginterferon alfa-2a Inj Kit 180 MCG/0.5ML: SUBCUTANEOUS | Qty: 0.5 | Status: AC

## 2014-02-26 ENCOUNTER — Telehealth: Payer: Self-pay | Admitting: *Deleted

## 2014-02-26 ENCOUNTER — Telehealth: Payer: Self-pay | Admitting: Hematology & Oncology

## 2014-02-26 NOTE — Telephone Encounter (Signed)
-----   Message from [name], MD sent at 02/23/2014  1:22 PM EST ----- Call and let her know that the CT of the right hip shows continued arthritis. Everything else looks okay. This really has not changed since November 2014.Laurey Arrow

## 2014-02-26 NOTE — Telephone Encounter (Addendum)
-----   Message from [name], MD sent at 02/23/2014  1:22 PM EST ----- Call and let her know that the CT of the right hip shows continued arthritis. Everything else looks okay. This really has not changed since November 2014.Brianna Fletcher -LVM letting pt know that the CT of the right hip shows continued arthritis. Everything else looks okay. This really has not changed since November 2014

## 2014-02-26 NOTE — Telephone Encounter (Signed)
Pt's FMLA papers complete and faxed to: [facility]  Fx: [phone]  Case Nbr: [place] SCANNED

## 2014-02-27 ENCOUNTER — Ambulatory Visit
Admission: RE | Admit: 2014-02-27 | Discharge: 2014-02-27 | Disposition: A | Discharge status: Home / Self Care | Payer: BC Managed Care – PPO | Source: Ambulatory Visit

## 2014-02-27 DIAGNOSIS — Z1231 Encounter for screening mammogram for malignant neoplasm of breast: Secondary | ICD-10-CM

## 2014-03-01 ENCOUNTER — Other Ambulatory Visit (HOSPITAL_BASED_OUTPATIENT_CLINIC_OR_DEPARTMENT_OTHER): Payer: BC Managed Care – PPO | Admitting: Lab

## 2014-03-01 ENCOUNTER — Ambulatory Visit (HOSPITAL_BASED_OUTPATIENT_CLINIC_OR_DEPARTMENT_OTHER): Payer: BC Managed Care – PPO

## 2014-03-01 VITALS — BP 139/84 | HR 79 | Temp 98.2°F | Resp 18

## 2014-03-01 DIAGNOSIS — D471 Chronic myeloproliferative disease: Secondary | ICD-10-CM

## 2014-03-01 DIAGNOSIS — D469 Myelodysplastic syndrome, unspecified: Secondary | ICD-10-CM

## 2014-03-01 LAB — CBC WITH DIFFERENTIAL (CANCER CENTER ONLY)
BASO#: 0 10*3/uL (ref 0.0–0.2)
BASO%: 0.1 % (ref 0.0–2.0)
EOS%: 0.3 % (ref 0.0–7.0)
Eosinophils Absolute: 0.1 10*3/uL (ref 0.0–0.5)
HCT: 35.7 % (ref 34.8–46.6)
HGB: 11.8 g/dL (ref 11.6–15.9)
LYMPH#: 2.8 10*3/uL (ref 0.9–3.3)
LYMPH%: 13.8 % — AB (ref 14.0–48.0)
MCH: 33.1 pg (ref 26.0–34.0)
MCHC: 33.1 g/dL (ref 32.0–36.0)
MCV: 100 fL (ref 81–101)
MONO#: 1 10*3/uL — AB (ref 0.1–0.9)
MONO%: 5 % (ref 0.0–13.0)
NEUT#: 16.2 10*3/uL — ABNORMAL HIGH (ref 1.5–6.5)
NEUT%: 80.8 % — AB (ref 39.6–80.0)
PLATELETS: 222 10*3/uL (ref 145–400)
RBC: 3.57 10*6/uL — ABNORMAL LOW (ref 3.70–5.32)
RDW: 14.3 % (ref 11.1–15.7)
WBC: 20.1 10*3/uL — ABNORMAL HIGH (ref 3.9–10.0)

## 2014-03-01 MED ORDER — PEGINTERFERON ALFA-2A 180 MCG/0.5ML ~~LOC~~ KIT: 81.0000 ug | PACK | Freq: Once | SUBCUTANEOUS | Status: DC

## 2014-03-01 MED ORDER — PEGINTERFERON ALFA-2A 180 MCG/ML ~~LOC~~ SOLN
81.0000 ug | SUBCUTANEOUS | Status: DC
  Filled 2014-03-01: qty 0.45

## 2014-03-01 MED ORDER — ACETAMINOPHEN 325 MG PO TABS: 650.0000 mg | ORAL_TABLET | Freq: Once | ORAL | Status: DC

## 2014-03-01 MED ORDER — PEGINTERFERON ALFA-2A 180 MCG/ML ~~LOC~~ SOLN
81.0000 ug | SUBCUTANEOUS | Status: DC
  Administered 2014-03-01: 81 ug via SUBCUTANEOUS
  Filled 2014-03-01: qty 0.45

## 2014-03-01 NOTE — Patient Instructions (Addendum)
[facility] Discharge Instructions for Patients Receiving Chemotherapy  Today you received the following chemotherapy agents Interferon   To help prevent nausea and vomiting after your treatment, we encourage you to take your nausea medication    If you develop nausea and vomiting that is not controlled by your nausea medication, call the clinic.   BELOW ARE SYMPTOMS THAT SHOULD BE REPORTED IMMEDIATELY:  *FEVER GREATER THAN 100.5 F  *CHILLS WITH OR WITHOUT FEVER  NAUSEA AND VOMITING THAT IS NOT CONTROLLED WITH YOUR NAUSEA MEDICATION  *UNUSUAL SHORTNESS OF BREATH  *UNUSUAL BRUISING OR BLEEDING  TENDERNESS IN MOUTH AND THROAT WITH OR WITHOUT PRESENCE OF ULCERS  *URINARY PROBLEMS  *BOWEL PROBLEMS  UNUSUAL RASH Items with * indicate a potential emergency and should be followed up as soon as possible.  Feel free to call the clinic you have any questions or concerns. The clinic phone number is [phone].   Peginterferon Alfa-2a Injection What is this medicine? PEGINTERFERON ALFA-2a (peg in ter Dorothy Puffer on AL fa 2 a) is a man-made drug that acts like a protein made by the body. It is used to treat chronic hepatitis B and C infections. This medicine may be used for other purposes; ask your health care provider or pharmacist if you have questions. COMMON BRAND NAME(S): Pegasys, Pegasys ProClick What should I tell my health care provider before I take this medicine? They need to know if you have any of these conditions: -alcoholism -auto-immune hepatitis -blood or bleeding disorders -colitis like ulcerative colitis or Crohn's disease -depression or other mental disorders -diabetes -drug abuse or addiction -heart disease -history of cancer -kidney disease -lupus -psoriasis -rheumatoid arthritis -thyroid problems -an unusual or allergic reaction to peginterferon, other medicines, foods, dyes, or preservatives like benzyl alcohol -pregnant or trying to get  pregnant -breast-feeding How should I use this medicine? This medicine is for injection under the skin. Do NOT shake this medicine. You will be taught how to prepare and give this medicine. Use exactly as directed. Take your medicine at regular intervals. Do not take your medicine more often than directed. It is important that you put your used needles and syringes in a special sharps container. Do not put them in a trash can. If you do not have a sharps container, call your pharmacist or healthcare provider to get one. A special MedGuide will be given to you by the pharmacist with each prescription and refill. Be sure to read this information carefully each time. Talk to your pediatrician regarding the use of this medicine in children. While this drug may be prescribed for children as young as 5 years for selected conditions, precautions do apply. Overdosage: If you think you have taken too much of this medicine contact a poison control center or emergency room at once. NOTE: This medicine is only for you. Do not share this medicine with others. What if I miss a dose? If you miss a dose and you remember within 2 days of when you missed your dose, take it as soon as you can. If it is late in the day, wait until the next day to take your dose. If more than 2 days have passed since you missed your dose ask your doctor what to do. Do not take double or extra doses. What may interact with this medicine? -alcohol -medicines for HIV like didanosine, lamivudine, stavudine, zidovudine -methadone -theophylline This list may not describe all possible interactions. Give your health care provider a list of all  the medicines, herbs, non-prescription drugs, or dietary supplements you use. Also tell them if you smoke, drink alcohol, or use illegal drugs. Some items may interact with your medicine. What should I watch for while using this medicine? Visit your doctor or health care professional for regular checks  on your progress. You will need regular blood checks. You may get drowsy or dizzy. Do not drive, use machinery, or do anything that needs mental alertness until you know how this medicine affects you. Do not stand or sit up quickly, especially if you are an older patient. This reduces the risk of dizzy or fainting spells. Alcohol may interfere with the effect of this medicine. Avoid alcoholic drinks. What side effects may I notice from receiving this medicine? Side effects that you should report to your doctor or health care professional as soon as possible: -allergic reactions like skin rash, itching or hives, swelling of the face, lips, or tongue -bloody diarrhea -breathing problems -change in blood sugar -changes in vision -chest pain -confusion, trouble speaking or understanding -fast, irregular heartbeat -fever -high blood pressure -increased anger, depression, irritability, or thoughts of suicide -pain in lower back or stomach -pain, tingling, numbness in the hands or feet -sudden numbness or weakness of the face, arm or leg -trouble passing urine or change in the amount of urine -trouble walking, dizziness, loss of balance or coordination -unusual bleeding or bruising Side effects that usually do not require medical attention (report to your doctor or health care professional if they continue or are bothersome): -aches, pains -dry, itchy skin -hair loss -loss of appetite -nausea, stomach upset -pain or swelling at site where injected -trouble sleeping -unusually weak or tired This list may not describe all possible side effects. Call your doctor for medical advice about side effects. You may report side effects to FDA at 1-800-FDA-1088. Where should I keep my medicine? Keep out of the reach of children. Store in a refrigerator between 2 and 8 degrees C (36 and 46 degrees F). Do not freeze. Protect from light. Do not leave this medicine out of the refrigerator for more than 24  hours. Throw away any unused medicine after the expiration date. NOTE: This sheet is a summary. It may not cover all possible information. If you have questions about this medicine, talk to your doctor, pharmacist, or health care provider.  2015, Elsevier/Gold Standard. (2009-11-13 17:27:34)

## 2014-03-08 ENCOUNTER — Ambulatory Visit (HOSPITAL_BASED_OUTPATIENT_CLINIC_OR_DEPARTMENT_OTHER): Payer: BC Managed Care – PPO

## 2014-03-08 ENCOUNTER — Ambulatory Visit (HOSPITAL_BASED_OUTPATIENT_CLINIC_OR_DEPARTMENT_OTHER): Payer: BC Managed Care – PPO | Admitting: Lab

## 2014-03-08 DIAGNOSIS — D471 Chronic myeloproliferative disease: Secondary | ICD-10-CM

## 2014-03-08 LAB — CBC WITH DIFFERENTIAL (CANCER CENTER ONLY)
BASO#: 0 10*3/uL (ref 0.0–0.2)
BASO%: 0.1 % (ref 0.0–2.0)
EOS%: 0.3 % (ref 0.0–7.0)
Eosinophils Absolute: 0.1 10*3/uL (ref 0.0–0.5)
HCT: 36 % (ref 34.8–46.6)
HGB: 11.7 g/dL (ref 11.6–15.9)
LYMPH#: 2.7 10*3/uL (ref 0.9–3.3)
LYMPH%: 14.2 % (ref 14.0–48.0)
MCH: 32.7 pg (ref 26.0–34.0)
MCHC: 32.5 g/dL (ref 32.0–36.0)
MCV: 101 fL (ref 81–101)
MONO#: 1 10*3/uL — AB (ref 0.1–0.9)
MONO%: 5 % (ref 0.0–13.0)
NEUT%: 80.4 % — ABNORMAL HIGH (ref 39.6–80.0)
NEUTROS ABS: 15.5 10*3/uL — AB (ref 1.5–6.5)
PLATELETS: 212 10*3/uL (ref 145–400)
RBC: 3.58 10*6/uL — ABNORMAL LOW (ref 3.70–5.32)
RDW: 14.1 % (ref 11.1–15.7)
WBC: 19.3 10*3/uL — ABNORMAL HIGH (ref 3.9–10.0)

## 2014-03-08 MED ORDER — PEGINTERFERON ALFA-2A 180 MCG/0.5ML ~~LOC~~ KIT: 81.0000 ug | PACK | Freq: Once | SUBCUTANEOUS | Status: DC

## 2014-03-08 MED ORDER — PEGINTERFERON ALFA-2A 180 MCG/ML ~~LOC~~ SOLN
81.0000 ug | SUBCUTANEOUS | Status: DC
  Administered 2014-03-08: 81 ug via SUBCUTANEOUS
  Filled 2014-03-08: qty 0.45

## 2014-03-08 MED ORDER — ACETAMINOPHEN 325 MG PO TABS: 650.0000 mg | ORAL_TABLET | Freq: Once | ORAL | Status: DC

## 2014-03-08 NOTE — Patient Instructions (Signed)
Interferon Alfa-2b injection What is this medicine? INTERFERON ALFA-2b (in ter FEER on AL fa 2 b) is a man-made protein. Natural interferons are produced in the body to help the immune system fight viral infections and certain cancer growths. This medicine has similar actions to natural interferons and is used to treat AIDS-related Kaposi's sarcoma, certain types of hepatitis or certain cancers. This medicine may also be used to treat genital or perianal warts. This medicine may be used for other purposes; ask your health care provider or pharmacist if you have questions. COMMON BRAND NAME(S): Intron A, Intron A Multidose Pen What should I tell my health care provider before I take this medicine? They need to know if you have any of these conditions: -autoimmune disease -blood or bleeding disorders -bone marrow disease -depression or other mental disorders -diabetes -heart or lung disease -kidney or liver disease -thyroid disease -an unusual or allergic reaction to interferons, E. coli protein, other medicines, foods, dyes, or preservatives -pregnant or trying to get pregnant -breast-feeding How should I use this medicine? This medicine is for injection into a muscle or under the skin or for infusion into a vein. It is usually given by a health care professional in a hospital or clinic setting. If you get this medicine at home, you will be taught how to prepare and give this medicine. Use exactly as directed. You can inject your dose at bedtime if you experience flu-like effects. Take your medicine at regular intervals. Do not take your medicine more often than directed. It is important that you put your used needles and syringes in a special sharps container. Do not put them in a trash can. If you do not have a sharps container, call your pharmacist or healthcare provider to get one. A special MedGuide will be given to you by the pharmacist with each prescription and refill. Be sure to read  this information carefully each time. Talk to your pediatrician regarding the use of this medicine in children. While this medicine may be prescribed for children as young as 1 year of age for selected conditions, precautions do apply. Overdosage: If you think you have taken too much of this medicine contact a poison control center or emergency room at once. NOTE: This medicine is only for you. Do not share this medicine with others. What if I miss a dose? If you miss a dose, take it as soon as you can. If it is almost time for your next dose, take only that dose. Do not take double or extra doses. What may interact with this medicine? -theophylline -zidovudine, AZT This list may not describe all possible interactions. Give your health care provider a list of all the medicines, herbs, non-prescription drugs, or dietary supplements you use. Also tell them if you smoke, drink alcohol, or use illegal drugs. Some items may interact with your medicine. What should I watch for while using this medicine? Visit your doctor or health care professional for regular checks on your progress. You will need regular blood checks. Do not change brands of this medicine without consulting your doctor or health care professional. Different brands of this medicine can act differently in your body. Check with your pharmacist if your refills do not look like your original product. You may get drowsy or dizzy. Do not drive, use machinery, or do anything that needs mental alertness until you know how this medicine affects you. Do not stand or sit up quickly, especially if you are an older patient.  This reduces the risk of dizzy or fainting spells. Alcohol can make you more drowsy or dizzy, increase confusion and lightheadedness. Avoid alcoholic drinks. This medicine can cause flu-like symptoms and make you feel generally unwell. If you get a fever or sore throat that do not go away after the first few weeks of treatment, do  not treat yourself. Report any of the above side effects, but continue your course of medicine even though you feel ill, unless your doctor or health care professional tells you to stop. This medicine may decrease your body's ability to fight certain types of infections. This may be more of a concern if you are receiving high doses or other chemotherapy agents. Other signs of infection include lower back or side pain, pain or difficulty passing urine. This medicine can cause blood problems or increase your risk to bruise or bleed. Call your doctor or health care professional if you notice any unusual bleeding. Be careful not to cut, bruise, or injure yourself because you may get an infection and bleed more than usual. What side effects may I notice from receiving this medicine? Side effects that you should report to your doctor or health care professional as soon as possible: -allergic reactions like skin rash, itching or hives, swelling of the face, lips, or tongue -black, tarry stools -blood in the urine -bruising or pinpoint red spots on the skin -changes in vision -confusion -depression -difficulty breathing -difficulty sleeping -difficulty thinking or concentrating -fainting spells, lightheadedness -irregular heartbeat, palpitations or chest pain -nervousness -numbness or tingling in the fingers and toes -signs of infection like fever or chills, cough, sore throat, pain or difficulty passing urine -unusually weak or tired Side effects that usually do not require medical attention (report to your doctor or health care professional if they continue or are bothersome): -changes in taste -cough -diarrhea -dry mouth -hair loss -headaches -loss of appetite -nausea, vomiting This list may not describe all possible side effects. Call your doctor for medical advice about side effects. You may report side effects to FDA at 1-800-FDA-1088. Where should I keep my medicine? Keep out of the  reach of children. Store in a refrigerator between 2 and 8 degrees C (36 and 46 degrees F). If you are using the powder, it should be used immediately after mixing or may be stored in the refrigerator for up to 24 hours. Do not freeze. Throw away any unused vials or syringes after the expiration date. NOTE: This sheet is a summary. It may not cover all possible information. If you have questions about this medicine, talk to your doctor, pharmacist, or health care provider.  2015, Elsevier/Gold Standard. (2007-07-25 16:11:19)

## 2014-03-13 ENCOUNTER — Other Ambulatory Visit: Payer: Self-pay | Admitting: *Deleted

## 2014-03-13 DIAGNOSIS — D72828 Other elevated white blood cell count: Secondary | ICD-10-CM

## 2014-03-14 ENCOUNTER — Ambulatory Visit (HOSPITAL_BASED_OUTPATIENT_CLINIC_OR_DEPARTMENT_OTHER): Payer: BC Managed Care – PPO | Admitting: Hematology & Oncology

## 2014-03-14 ENCOUNTER — Encounter: Payer: Self-pay | Admitting: Hematology & Oncology

## 2014-03-14 ENCOUNTER — Other Ambulatory Visit (HOSPITAL_BASED_OUTPATIENT_CLINIC_OR_DEPARTMENT_OTHER): Payer: BC Managed Care – PPO | Admitting: Lab

## 2014-03-14 ENCOUNTER — Ambulatory Visit (HOSPITAL_BASED_OUTPATIENT_CLINIC_OR_DEPARTMENT_OTHER): Payer: BC Managed Care – PPO

## 2014-03-14 VITALS — BP 152/72 | HR 72 | Temp 98.0°F | Resp 72 | Ht 65.0 in | Wt 232.0 lb

## 2014-03-14 DIAGNOSIS — D471 Chronic myeloproliferative disease: Secondary | ICD-10-CM

## 2014-03-14 DIAGNOSIS — D72828 Other elevated white blood cell count: Secondary | ICD-10-CM

## 2014-03-14 LAB — CBC WITH DIFFERENTIAL (CANCER CENTER ONLY)
BASO#: 0 10*3/uL (ref 0.0–0.2)
BASO%: 0.1 % (ref 0.0–2.0)
EOS ABS: 0.1 10*3/uL (ref 0.0–0.5)
EOS%: 0.3 % (ref 0.0–7.0)
HEMATOCRIT: 36.7 % (ref 34.8–46.6)
HEMOGLOBIN: 11.8 g/dL (ref 11.6–15.9)
LYMPH#: 2.8 10*3/uL (ref 0.9–3.3)
LYMPH%: 15.6 % (ref 14.0–48.0)
MCH: 32.2 pg (ref 26.0–34.0)
MCHC: 32.2 g/dL (ref 32.0–36.0)
MCV: 100 fL (ref 81–101)
MONO#: 1.1 10*3/uL — AB (ref 0.1–0.9)
MONO%: 5.8 % (ref 0.0–13.0)
NEUT%: 78.2 % (ref 39.6–80.0)
NEUTROS ABS: 14.2 10*3/uL — AB (ref 1.5–6.5)
Platelets: 210 10*3/uL (ref 145–400)
RBC: 3.66 10*6/uL — AB (ref 3.70–5.32)
RDW: 13.8 % (ref 11.1–15.7)
WBC: 18.1 10*3/uL — AB (ref 3.9–10.0)

## 2014-03-14 MED ORDER — ACETAMINOPHEN 325 MG PO TABS
650.0000 mg | ORAL_TABLET | Freq: Once | ORAL | Status: AC
  Administered 2014-03-14: 650 mg via ORAL

## 2014-03-14 MED ORDER — PEGINTERFERON ALFA-2A 180 MCG/ML ~~LOC~~ SOLN
81.0000 ug | SUBCUTANEOUS | Status: DC
  Administered 2014-03-14: 81 ug via SUBCUTANEOUS
  Filled 2014-03-14: qty 0.45

## 2014-03-14 MED ORDER — PEGINTERFERON ALFA-2A 180 MCG/0.5ML ~~LOC~~ KIT: 81.0000 ug | PACK | Freq: Once | SUBCUTANEOUS | Status: DC

## 2014-03-14 NOTE — Patient Instructions (Signed)
[facility] Discharge Instructions for Patients Receiving Chemotherapy  Today you received the following chemotherapy agents Injection.   To help prevent nausea and vomiting after your treatment, we encourage you to take your nausea medication as prescribed.    If you develop nausea and vomiting that is not controlled by your nausea medication, call the clinic.   BELOW ARE SYMPTOMS THAT SHOULD BE REPORTED IMMEDIATELY:  *FEVER GREATER THAN 100.5 F  *CHILLS WITH OR WITHOUT FEVER  NAUSEA AND VOMITING THAT IS NOT CONTROLLED WITH YOUR NAUSEA MEDICATION  *UNUSUAL SHORTNESS OF BREATH  *UNUSUAL BRUISING OR BLEEDING  TENDERNESS IN MOUTH AND THROAT WITH OR WITHOUT PRESENCE OF ULCERS  *URINARY PROBLEMS  *BOWEL PROBLEMS  UNUSUAL RASH Items with * indicate a potential emergency and should be followed up as soon as possible.  Feel free to call the clinic you have any questions or concerns. The clinic phone number is [phone].

## 2014-03-14 NOTE — Progress Notes (Signed)
Hematology and Oncology Follow Up Visit  Brianna Fletcher [id number] Feb 15, 1971 43 y.o. 03/14/2014   Principle Diagnosis:  Chronic myeloproliferative syndrome-Triple negative  Current Therapy:   Peg-Interferon q weekly     Interim History:  Ms.  Brianna Fletcher is back for followup. She has tolerated the interferon well. She really looks good. [name] working. She is working part-time but probably will back to full-time after the holidays.  [name] getting ready for Christmas. Her son from [place] is out visiting. I met him today. He's really nice. He is fun to talk to.  I'm just thankful that the interferon has worked so well. [name] tolerating it well. Hopefully, we might be able to pull back the frequency to every couple weeks instead of weekly dosing. I probably would think about this if her blood counts continue to improve in 2 or 3 months.   I sent off a comprehensive genetic panel for myeloproliferative disorders. This is come back normal.  We did do a CT scan of her right hip. She has some arthritis in that area. If this becomes more of a problem, we can always get her to orthopedic surgery for a steroid injection. She has some pain in the right hip. This is been constant but has not worsened.   [name] had no rashes. [name] had no fever. Her appetite has been good. There has been no change in bowel or bladder habits.  She had a good Thanksgiving.  Overall, her performance status is ECOG 1. Medications: Current outpatient prescriptions: doxepin (SINEQUAN) 50 MG capsule, TAKE 1 CAPSULE BY MOUTH AT BEDTIME, Disp: 30 capsule, Rfl: 3 No current facility-administered medications for this visit. Facility-Administered Medications Ordered in Other Visits: acetaminophen (TYLENOL) tablet 650 mg, 650 mg, Oral, Once, [name], MD, Stopped at 03/08/14 1527;  peginterferon alfa-2a (PEGASYS) injection 81 mcg, 81 mcg, Subcutaneous, Weekly, [name], MD, 81 mcg at 03/08/14 1602;  peginterferon  alfa-2a (PEGASYS) injection 81 mcg, 81 mcg, Subcutaneous, Weekly, [name], MD, 81 mcg at 03/14/14 1056  Allergies: No Known Allergies  Past Medical History, Surgical history, Social history, and Family History were reviewed and updated.  Review of Systems: As above  Physical Exam:  height is '5\' 5"'  (1.651 m) and weight is 232 lb (105.235 kg). Her oral temperature is 98 F (36.7 C). Her blood pressure is 152/72 and her pulse is 72. Her respiration is 72.   Well-developed and well-nourished white female. Head and neck exam shows no ocular or oral lesions. She has no palpable cervical or supraclavicular lymph nodes. Lungs are clear. Cardiac exam regular rate and rhythm with no murmurs rubs or bruits. Abdomen is soft. She has good bowel sounds. There is no fluid wave. There is no palpable liver or spleen tip. Back exam shows no tenderness over the spine or ribs. There is some tenderness over the right hip area. She has some slight decreased range of motion of the right hip. Extremities shows no clubbing, cyanosis or edema. Skin exam no rashes, ecchymosis or petechia. Neurological exam shows no focal neurological deficits.  Lab Results  Component Value Date   WBC 18.1* 03/14/2014   HGB 11.8 03/14/2014   HCT 36.7 03/14/2014   MCV 100 03/14/2014   PLT 210 03/14/2014     Chemistry      Component Value Date/Time   NA 140 02/22/2014 1509   NA 134* 11/20/2013 1116   K 3.6 02/22/2014 1509   K 4.1 11/20/2013 1116   CL  106 02/22/2014 1509   CL 102 11/20/2013 1116   CO2 25 02/22/2014 1509   CO2 23 11/20/2013 1116   BUN 12 02/22/2014 1509   BUN 9 11/20/2013 1116   CREATININE 0.8 02/22/2014 1509   CREATININE 0.68 11/20/2013 1116      Component Value Date/Time   CALCIUM 9.7 02/22/2014 1509   CALCIUM 9.9 11/20/2013 1116   ALKPHOS 134* 02/22/2014 1509   ALKPHOS 123* 11/20/2013 1116   AST 20 02/22/2014 1509   AST 13 11/20/2013 1116   ALT 17 02/22/2014 1509   ALT 9 11/20/2013 1116    BILITOT 0.60 02/22/2014 1509   BILITOT 0.6 11/20/2013 1116         Impression and Plan: [name] is 43 year old female. She has a myeloproliferative syndrome. So far, our workup has been totally negative.  Her blood counts continue to improve. Her white cell count continues to come down. Her hemoglobin is holding steady. Her platelet count is also holding steady.   I'm glad that the interferon is keeping her white cell count "in check". Her blood smear certainly does not look unusual outside of the increased in myeloid cells. These appear mature.  We will continue the interferon weekly. Again, she is tolerating this pretty well.  I will plan to see her back in about 4-6 weeks.  I spent about 30 minutes with her.   [name], MD 12/24/201511:02 AM

## 2014-03-21 ENCOUNTER — Other Ambulatory Visit (HOSPITAL_BASED_OUTPATIENT_CLINIC_OR_DEPARTMENT_OTHER): Payer: BC Managed Care – PPO | Admitting: Lab

## 2014-03-21 ENCOUNTER — Ambulatory Visit (HOSPITAL_BASED_OUTPATIENT_CLINIC_OR_DEPARTMENT_OTHER): Payer: BC Managed Care – PPO

## 2014-03-21 VITALS — BP 124/71 | HR 68 | Temp 97.8°F

## 2014-03-21 DIAGNOSIS — D471 Chronic myeloproliferative disease: Secondary | ICD-10-CM

## 2014-03-21 LAB — CBC WITH DIFFERENTIAL (CANCER CENTER ONLY)
BASO#: 0 10*3/uL (ref 0.0–0.2)
BASO%: 0.1 % (ref 0.0–2.0)
EOS%: 0.4 % (ref 0.0–7.0)
Eosinophils Absolute: 0.1 10*3/uL (ref 0.0–0.5)
HCT: 37 % (ref 34.8–46.6)
HGB: 11.9 g/dL (ref 11.6–15.9)
LYMPH#: 2.4 10*3/uL (ref 0.9–3.3)
LYMPH%: 14.4 % (ref 14.0–48.0)
MCH: 31.7 pg (ref 26.0–34.0)
MCHC: 32.2 g/dL (ref 32.0–36.0)
MCV: 99 fL (ref 81–101)
MONO#: 0.9 10*3/uL (ref 0.1–0.9)
MONO%: 5.1 % (ref 0.0–13.0)
NEUT#: 13.3 10*3/uL — ABNORMAL HIGH (ref 1.5–6.5)
NEUT%: 80 % (ref 39.6–80.0)
Platelets: 201 10*3/uL (ref 145–400)
RBC: 3.75 10*6/uL (ref 3.70–5.32)
RDW: 13.8 % (ref 11.1–15.7)
WBC: 16.6 10*3/uL — ABNORMAL HIGH (ref 3.9–10.0)

## 2014-03-21 LAB — TECHNOLOGIST REVIEW CHCC SATELLITE

## 2014-03-21 MED ORDER — PEGINTERFERON ALFA-2A 180 MCG/0.5ML ~~LOC~~ KIT: 81.0000 ug | PACK | Freq: Once | SUBCUTANEOUS | Status: DC

## 2014-03-21 MED ORDER — PEGINTERFERON ALFA-2A 180 MCG/ML ~~LOC~~ SOLN
81.0000 ug | SUBCUTANEOUS | Status: DC
  Administered 2014-03-21: 81 ug via SUBCUTANEOUS
  Filled 2014-03-21 (×2): qty 0.45

## 2014-03-21 NOTE — Progress Notes (Signed)
Pt took tylenol this am before receiving injection

## 2014-03-21 NOTE — Patient Instructions (Signed)
Peginterferon Alfa-2a Injection What is this medicine? PEGINTERFERON ALFA-2a (peg in ter [address] Puffer on AL fa 2 a) is a man-made drug that acts like a protein made by the body. It is used to treat chronic hepatitis B and C infections. This medicine may be used for other purposes; ask your health care provider or pharmacist if you have questions. COMMON BRAND NAME(S): Pegasys, Pegasys ProClick What should I tell my health care provider before I take this medicine? They need to know if you have any of these conditions: -alcoholism -auto-immune hepatitis -blood or bleeding disorders -colitis like ulcerative colitis or Crohn's disease -depression or other mental disorders -diabetes -drug abuse or addiction -heart disease -history of cancer -kidney disease -lupus -psoriasis -rheumatoid arthritis -thyroid problems -an unusual or allergic reaction to peginterferon, other medicines, foods, dyes, or preservatives like benzyl alcohol -pregnant or trying to get pregnant -breast-feeding How should I use this medicine? This medicine is for injection under the skin. Do NOT shake this medicine. You will be taught how to prepare and give this medicine. Use exactly as directed. Take your medicine at regular intervals. Do not take your medicine more often than directed. It is important that you put your used needles and syringes in a special sharps container. Do not put them in a trash can. If you do not have a sharps container, call your pharmacist or healthcare provider to get one. A special MedGuide will be given to you by the pharmacist with each prescription and refill. Be sure to read this information carefully each time. Talk to your pediatrician regarding the use of this medicine in children. While this drug may be prescribed for children as young as 5 years for selected conditions, precautions do apply. Overdosage: If you think you have taken too much of this medicine contact a poison control center or  emergency room at once. NOTE: This medicine is only for you. Do not share this medicine with others. What if I miss a dose? If you miss a dose and you remember within 2 days of when you missed your dose, take it as soon as you can. If it is late in the day, wait until the next day to take your dose. If more than 2 days have passed since you missed your dose ask your doctor what to do. Do not take double or extra doses. What may interact with this medicine? -alcohol -medicines for HIV like didanosine, lamivudine, stavudine, zidovudine -methadone -theophylline This list may not describe all possible interactions. Give your health care provider a list of all the medicines, herbs, non-prescription drugs, or dietary supplements you use. Also tell them if you smoke, drink alcohol, or use illegal drugs. Some items may interact with your medicine. What should I watch for while using this medicine? Visit your doctor or health care professional for regular checks on your progress. You will need regular blood checks. You may get drowsy or dizzy. Do not drive, use machinery, or do anything that needs mental alertness until you know how this medicine affects you. Do not stand or sit up quickly, especially if you are an older patient. This reduces the risk of dizzy or fainting spells. Alcohol may interfere with the effect of this medicine. Avoid alcoholic drinks. What side effects may I notice from receiving this medicine? Side effects that you should report to your doctor or health care professional as soon as possible: -allergic reactions like skin rash, itching or hives, swelling of the face, lips, or tongue -bloody  diarrhea -breathing problems -change in blood sugar -changes in vision -chest pain -confusion, trouble speaking or understanding -fast, irregular heartbeat -fever -high blood pressure -increased anger, depression, irritability, or thoughts of suicide -pain in lower back or stomach -pain,  tingling, numbness in the hands or feet -sudden numbness or weakness of the face, arm or leg -trouble passing urine or change in the amount of urine -trouble walking, dizziness, loss of balance or coordination -unusual bleeding or bruising Side effects that usually do not require medical attention (report to your doctor or health care professional if they continue or are bothersome): -aches, pains -dry, itchy skin -hair loss -loss of appetite -nausea, stomach upset -pain or swelling at site where injected -trouble sleeping -unusually weak or tired This list may not describe all possible side effects. Call your doctor for medical advice about side effects. You may report side effects to FDA at 1-800-FDA-1088. Where should I keep my medicine? Keep out of the reach of children. Store in a refrigerator between 2 and 8 degrees C (36 and 46 degrees F). Do not freeze. Protect from light. Do not leave this medicine out of the refrigerator for more than 24 hours. Throw away any unused medicine after the expiration date. NOTE: This sheet is a summary. It may not cover all possible information. If you have questions about this medicine, talk to your doctor, pharmacist, or health care provider.  2015, [employer]vier/Gold Standard. (2009-11-13 17:27:34)

## 2014-03-28 ENCOUNTER — Other Ambulatory Visit (HOSPITAL_BASED_OUTPATIENT_CLINIC_OR_DEPARTMENT_OTHER): Payer: BLUE CROSS/BLUE SHIELD | Admitting: Lab

## 2014-03-28 ENCOUNTER — Other Ambulatory Visit: Payer: BC Managed Care – PPO | Admitting: Lab

## 2014-03-28 ENCOUNTER — Ambulatory Visit (HOSPITAL_BASED_OUTPATIENT_CLINIC_OR_DEPARTMENT_OTHER): Payer: BLUE CROSS/BLUE SHIELD

## 2014-03-28 ENCOUNTER — Ambulatory Visit: Payer: BC Managed Care – PPO

## 2014-03-28 DIAGNOSIS — D471 Chronic myeloproliferative disease: Secondary | ICD-10-CM

## 2014-03-28 LAB — CBC WITH DIFFERENTIAL (CANCER CENTER ONLY)
BASO#: 0 10*3/uL (ref 0.0–0.2)
BASO%: 0.1 % (ref 0.0–2.0)
EOS ABS: 0.1 10*3/uL (ref 0.0–0.5)
EOS%: 0.3 % (ref 0.0–7.0)
HCT: 37.1 % (ref 34.8–46.6)
HGB: 11.9 g/dL (ref 11.6–15.9)
LYMPH#: 2.6 10*3/uL (ref 0.9–3.3)
LYMPH%: 14 % (ref 14.0–48.0)
MCH: 31.4 pg (ref 26.0–34.0)
MCHC: 32.1 g/dL (ref 32.0–36.0)
MCV: 98 fL (ref 81–101)
MONO#: 1 10*3/uL — ABNORMAL HIGH (ref 0.1–0.9)
MONO%: 5.3 % (ref 0.0–13.0)
NEUT#: 14.7 10*3/uL — ABNORMAL HIGH (ref 1.5–6.5)
NEUT%: 80.3 % — AB (ref 39.6–80.0)
Platelets: 212 10*3/uL (ref 145–400)
RBC: 3.79 10*6/uL (ref 3.70–5.32)
RDW: 13.8 % (ref 11.1–15.7)
WBC: 18.3 10*3/uL — ABNORMAL HIGH (ref 3.9–10.0)

## 2014-03-28 LAB — CMP (CANCER CENTER ONLY)
ALK PHOS: 139 U/L — AB (ref 26–84)
ALT: 22 U/L (ref 10–47)
AST: 31 U/L (ref 11–38)
Albumin: 4.2 g/dL (ref 3.3–5.5)
BUN, Bld: 9 mg/dL (ref 7–22)
CHLORIDE: 105 meq/L (ref 98–108)
CO2: 25 mEq/L (ref 18–33)
CREATININE: 0.6 mg/dL (ref 0.6–1.2)
Calcium: 10.3 mg/dL (ref 8.0–10.3)
Glucose, Bld: 106 mg/dL (ref 73–118)
Potassium: 3.9 mEq/L (ref 3.3–4.7)
Sodium: 140 mEq/L (ref 128–145)
Total Bilirubin: 0.7 mg/dl (ref 0.20–1.60)
Total Protein: 8 g/dL (ref 6.4–8.1)

## 2014-03-28 LAB — CHCC SATELLITE - SMEAR

## 2014-03-28 LAB — LACTATE DEHYDROGENASE: LDH: 155 U/L (ref 94–250)

## 2014-03-28 MED ORDER — PEGINTERFERON ALFA-2A 180 MCG/ML ~~LOC~~ SOLN
81.0000 ug | Freq: Once | SUBCUTANEOUS | Status: AC
  Administered 2014-03-28: 81 ug via SUBCUTANEOUS
  Filled 2014-03-28: qty 0.45

## 2014-03-28 MED ORDER — PEGINTERFERON ALFA-2A 180 MCG/0.5ML ~~LOC~~ KIT: 81.0000 ug | PACK | Freq: Once | SUBCUTANEOUS | Status: DC

## 2014-03-28 MED ORDER — PEGINTERFERON ALFA-2A 180 MCG/ML ~~LOC~~ SOLN
81.0000 ug | SUBCUTANEOUS | Status: DC
Start: 2014-03-28 — End: 2014-03-28
  Filled 2014-03-28: qty 0.45

## 2014-03-28 NOTE — Patient Instructions (Signed)
Peginterferon Alfa-2a Injection What is this medicine? PEGINTERFERON ALFA-2a (peg in ter [address] Puffer on AL fa 2 a) is a man-made drug that acts like a protein made by the body. It is used to treat chronic hepatitis B and C infections. This medicine may be used for other purposes; ask your health care provider or pharmacist if you have questions. COMMON BRAND NAME(S): Pegasys, Pegasys ProClick What should I tell my health care provider before I take this medicine? They need to know if you have any of these conditions: -alcoholism -auto-immune hepatitis -blood or bleeding disorders -colitis like ulcerative colitis or Crohn's disease -depression or other mental disorders -diabetes -drug abuse or addiction -heart disease -history of cancer -kidney disease -lupus -psoriasis -rheumatoid arthritis -thyroid problems -an unusual or allergic reaction to peginterferon, other medicines, foods, dyes, or preservatives like benzyl alcohol -pregnant or trying to get pregnant -breast-feeding How should I use this medicine? This medicine is for injection under the skin. Do NOT shake this medicine. You will be taught how to prepare and give this medicine. Use exactly as directed. Take your medicine at regular intervals. Do not take your medicine more often than directed. It is important that you put your used needles and syringes in a special sharps container. Do not put them in a trash can. If you do not have a sharps container, call your pharmacist or healthcare provider to get one. A special MedGuide will be given to you by the pharmacist with each prescription and refill. Be sure to read this information carefully each time. Talk to your pediatrician regarding the use of this medicine in children. While this drug may be prescribed for children as young as 5 years for selected conditions, precautions do apply. Overdosage: If you think you have taken too much of this medicine contact a poison control center or  emergency room at once. NOTE: This medicine is only for you. Do not share this medicine with others. What if I miss a dose? If you miss a dose and you remember within 2 days of when you missed your dose, take it as soon as you can. If it is late in the day, wait until the next day to take your dose. If more than 2 days have passed since you missed your dose ask your doctor what to do. Do not take double or extra doses. What may interact with this medicine? -alcohol -medicines for HIV like didanosine, lamivudine, stavudine, zidovudine -methadone -theophylline This list may not describe all possible interactions. Give your health care provider a list of all the medicines, herbs, non-prescription drugs, or dietary supplements you use. Also tell them if you smoke, drink alcohol, or use illegal drugs. Some items may interact with your medicine. What should I watch for while using this medicine? Visit your doctor or health care professional for regular checks on your progress. You will need regular blood checks. You may get drowsy or dizzy. Do not drive, use machinery, or do anything that needs mental alertness until you know how this medicine affects you. Do not stand or sit up quickly, especially if you are an older patient. This reduces the risk of dizzy or fainting spells. Alcohol may interfere with the effect of this medicine. Avoid alcoholic drinks. What side effects may I notice from receiving this medicine? Side effects that you should report to your doctor or health care professional as soon as possible: -allergic reactions like skin rash, itching or hives, swelling of the face, lips, or tongue -bloody  diarrhea -breathing problems -change in blood sugar -changes in vision -chest pain -confusion, trouble speaking or understanding -fast, irregular heartbeat -fever -high blood pressure -increased anger, depression, irritability, or thoughts of suicide -pain in lower back or stomach -pain,  tingling, numbness in the hands or feet -sudden numbness or weakness of the face, arm or leg -trouble passing urine or change in the amount of urine -trouble walking, dizziness, loss of balance or coordination -unusual bleeding or bruising Side effects that usually do not require medical attention (report to your doctor or health care professional if they continue or are bothersome): -aches, pains -dry, itchy skin -hair loss -loss of appetite -nausea, stomach upset -pain or swelling at site where injected -trouble sleeping -unusually weak or tired This list may not describe all possible side effects. Call your doctor for medical advice about side effects. You may report side effects to FDA at 1-800-FDA-1088. Where should I keep my medicine? Keep out of the reach of children. Store in a refrigerator between 2 and 8 degrees C (36 and 46 degrees F). Do not freeze. Protect from light. Do not leave this medicine out of the refrigerator for more than 24 hours. Throw away any unused medicine after the expiration date. NOTE: This sheet is a summary. It may not cover all possible information. If you have questions about this medicine, talk to your doctor, pharmacist, or health care provider.  2015, [employer]vier/Gold Standard. (2009-11-13 17:27:34)

## 2014-03-29 ENCOUNTER — Ambulatory Visit: Payer: BC Managed Care – PPO

## 2014-03-29 ENCOUNTER — Other Ambulatory Visit: Payer: BC Managed Care – PPO | Admitting: Lab

## 2014-04-04 ENCOUNTER — Other Ambulatory Visit: Payer: BC Managed Care – PPO | Admitting: Lab

## 2014-04-04 ENCOUNTER — Ambulatory Visit: Payer: BC Managed Care – PPO

## 2014-04-04 ENCOUNTER — Other Ambulatory Visit: Payer: Self-pay

## 2014-04-04 DIAGNOSIS — D471 Chronic myeloproliferative disease: Secondary | ICD-10-CM

## 2014-04-05 ENCOUNTER — Ambulatory Visit (HOSPITAL_BASED_OUTPATIENT_CLINIC_OR_DEPARTMENT_OTHER): Payer: BLUE CROSS/BLUE SHIELD

## 2014-04-05 ENCOUNTER — Other Ambulatory Visit (HOSPITAL_BASED_OUTPATIENT_CLINIC_OR_DEPARTMENT_OTHER): Payer: BLUE CROSS/BLUE SHIELD | Admitting: Lab

## 2014-04-05 DIAGNOSIS — D471 Chronic myeloproliferative disease: Secondary | ICD-10-CM

## 2014-04-05 LAB — CBC WITH DIFFERENTIAL (CANCER CENTER ONLY)
BASO#: 0 10*3/uL (ref 0.0–0.2)
BASO%: 0.1 % (ref 0.0–2.0)
EOS%: 0.6 % (ref 0.0–7.0)
Eosinophils Absolute: 0.1 10*3/uL (ref 0.0–0.5)
HCT: 34.2 % — ABNORMAL LOW (ref 34.8–46.6)
HGB: 11.1 g/dL — ABNORMAL LOW (ref 11.6–15.9)
LYMPH#: 2.1 10*3/uL (ref 0.9–3.3)
LYMPH%: 14.6 % (ref 14.0–48.0)
MCH: 31.5 pg (ref 26.0–34.0)
MCHC: 32.5 g/dL (ref 32.0–36.0)
MCV: 97 fL (ref 81–101)
MONO#: 0.8 10*3/uL (ref 0.1–0.9)
MONO%: 5.2 % (ref 0.0–13.0)
NEUT%: 79.5 % (ref 39.6–80.0)
NEUTROS ABS: 11.5 10*3/uL — AB (ref 1.5–6.5)
PLATELETS: 211 10*3/uL (ref 145–400)
RBC: 3.52 10*6/uL — ABNORMAL LOW (ref 3.70–5.32)
RDW: 13.4 % (ref 11.1–15.7)
WBC: 14.5 10*3/uL — ABNORMAL HIGH (ref 3.9–10.0)

## 2014-04-05 LAB — CMP (CANCER CENTER ONLY)
ALT: 22 U/L (ref 10–47)
AST: 31 U/L (ref 11–38)
Albumin: 3.4 g/dL (ref 3.3–5.5)
Alkaline Phosphatase: 122 U/L — ABNORMAL HIGH (ref 26–84)
BUN, Bld: 6 mg/dL — ABNORMAL LOW (ref 7–22)
CALCIUM: 9.1 mg/dL (ref 8.0–10.3)
CHLORIDE: 101 meq/L (ref 98–108)
CO2: 26 mEq/L (ref 18–33)
Creat: 0.8 mg/dl (ref 0.6–1.2)
Glucose, Bld: 94 mg/dL (ref 73–118)
POTASSIUM: 3.4 meq/L (ref 3.3–4.7)
Sodium: 138 mEq/L (ref 128–145)
Total Bilirubin: 0.5 mg/dl (ref 0.20–1.60)
Total Protein: 7.2 g/dL (ref 6.4–8.1)

## 2014-04-05 MED ORDER — PEGINTERFERON ALFA-2A 180 MCG/0.5ML ~~LOC~~ KIT: 81.0000 ug | PACK | Freq: Once | SUBCUTANEOUS | Status: DC

## 2014-04-05 MED ORDER — PEGINTERFERON ALFA-2A 180 MCG/ML ~~LOC~~ SOLN
81.0000 ug | SUBCUTANEOUS | Status: DC
  Administered 2014-04-05: 81 ug via SUBCUTANEOUS
  Filled 2014-04-05: qty 0.45

## 2014-04-05 NOTE — Patient Instructions (Signed)
Peginterferon Alfa-2a Injection What is this medicine? PEGINTERFERON ALFA-2a (peg in ter [address] Puffer on AL fa 2 a) is a man-made drug that acts like a protein made by the body. It is used to treat chronic hepatitis B and C infections. This medicine may be used for other purposes; ask your health care provider or pharmacist if you have questions. COMMON BRAND NAME(S): Pegasys, Pegasys ProClick What should I tell my health care provider before I take this medicine? They need to know if you have any of these conditions: -alcoholism -auto-immune hepatitis -blood or bleeding disorders -colitis like ulcerative colitis or Crohn's disease -depression or other mental disorders -diabetes -drug abuse or addiction -heart disease -history of cancer -kidney disease -lupus -psoriasis -rheumatoid arthritis -thyroid problems -an unusual or allergic reaction to peginterferon, other medicines, foods, dyes, or preservatives like benzyl alcohol -pregnant or trying to get pregnant -breast-feeding How should I use this medicine? This medicine is for injection under the skin. Do NOT shake this medicine. You will be taught how to prepare and give this medicine. Use exactly as directed. Take your medicine at regular intervals. Do not take your medicine more often than directed. It is important that you put your used needles and syringes in a special sharps container. Do not put them in a trash can. If you do not have a sharps container, call your pharmacist or healthcare provider to get one. A special MedGuide will be given to you by the pharmacist with each prescription and refill. Be sure to read this information carefully each time. Talk to your pediatrician regarding the use of this medicine in children. While this drug may be prescribed for children as young as 5 years for selected conditions, precautions do apply. Overdosage: If you think you have taken too much of this medicine contact a poison control center or  emergency room at once. NOTE: This medicine is only for you. Do not share this medicine with others. What if I miss a dose? If you miss a dose and you remember within 2 days of when you missed your dose, take it as soon as you can. If it is late in the day, wait until the next day to take your dose. If more than 2 days have passed since you missed your dose ask your doctor what to do. Do not take double or extra doses. What may interact with this medicine? -alcohol -medicines for HIV like didanosine, lamivudine, stavudine, zidovudine -methadone -theophylline This list may not describe all possible interactions. Give your health care provider a list of all the medicines, herbs, non-prescription drugs, or dietary supplements you use. Also tell them if you smoke, drink alcohol, or use illegal drugs. Some items may interact with your medicine. What should I watch for while using this medicine? Visit your doctor or health care professional for regular checks on your progress. You will need regular blood checks. You may get drowsy or dizzy. Do not drive, use machinery, or do anything that needs mental alertness until you know how this medicine affects you. Do not stand or sit up quickly, especially if you are an older patient. This reduces the risk of dizzy or fainting spells. Alcohol may interfere with the effect of this medicine. Avoid alcoholic drinks. What side effects may I notice from receiving this medicine? Side effects that you should report to your doctor or health care professional as soon as possible: -allergic reactions like skin rash, itching or hives, swelling of the face, lips, or tongue -bloody  diarrhea -breathing problems -change in blood sugar -changes in vision -chest pain -confusion, trouble speaking or understanding -fast, irregular heartbeat -fever -high blood pressure -increased anger, depression, irritability, or thoughts of suicide -pain in lower back or stomach -pain,  tingling, numbness in the hands or feet -sudden numbness or weakness of the face, arm or leg -trouble passing urine or change in the amount of urine -trouble walking, dizziness, loss of balance or coordination -unusual bleeding or bruising Side effects that usually do not require medical attention (report to your doctor or health care professional if they continue or are bothersome): -aches, pains -dry, itchy skin -hair loss -loss of appetite -nausea, stomach upset -pain or swelling at site where injected -trouble sleeping -unusually weak or tired This list may not describe all possible side effects. Call your doctor for medical advice about side effects. You may report side effects to FDA at 1-800-FDA-1088. Where should I keep my medicine? Keep out of the reach of children. Store in a refrigerator between 2 and 8 degrees C (36 and 46 degrees F). Do not freeze. Protect from light. Do not leave this medicine out of the refrigerator for more than 24 hours. Throw away any unused medicine after the expiration date. NOTE: This sheet is a summary. It may not cover all possible information. If you have questions about this medicine, talk to your doctor, pharmacist, or health care provider.  2015, [employer]vier/Gold Standard. (2009-11-13 17:27:34)

## 2014-04-11 ENCOUNTER — Ambulatory Visit: Payer: BC Managed Care – PPO | Admitting: Family

## 2014-04-11 ENCOUNTER — Other Ambulatory Visit: Payer: BC Managed Care – PPO | Admitting: Lab

## 2014-04-11 ENCOUNTER — Ambulatory Visit: Payer: BC Managed Care – PPO

## 2014-04-11 ENCOUNTER — Other Ambulatory Visit: Payer: Self-pay | Admitting: *Deleted

## 2014-04-11 DIAGNOSIS — D72828 Other elevated white blood cell count: Secondary | ICD-10-CM

## 2014-04-12 ENCOUNTER — Other Ambulatory Visit: Payer: BC Managed Care – PPO | Admitting: Lab

## 2014-04-12 ENCOUNTER — Ambulatory Visit: Payer: BC Managed Care – PPO | Admitting: Family

## 2014-04-12 ENCOUNTER — Encounter: Payer: Self-pay | Admitting: Family

## 2014-04-12 ENCOUNTER — Ambulatory Visit: Payer: BC Managed Care – PPO

## 2014-04-12 ENCOUNTER — Other Ambulatory Visit (HOSPITAL_BASED_OUTPATIENT_CLINIC_OR_DEPARTMENT_OTHER): Payer: BLUE CROSS/BLUE SHIELD | Admitting: Lab

## 2014-04-12 ENCOUNTER — Ambulatory Visit (HOSPITAL_BASED_OUTPATIENT_CLINIC_OR_DEPARTMENT_OTHER): Payer: BLUE CROSS/BLUE SHIELD | Admitting: Family

## 2014-04-12 ENCOUNTER — Ambulatory Visit (HOSPITAL_BASED_OUTPATIENT_CLINIC_OR_DEPARTMENT_OTHER): Payer: BLUE CROSS/BLUE SHIELD

## 2014-04-12 DIAGNOSIS — D471 Chronic myeloproliferative disease: Secondary | ICD-10-CM

## 2014-04-12 DIAGNOSIS — D72828 Other elevated white blood cell count: Secondary | ICD-10-CM

## 2014-04-12 LAB — CMP (CANCER CENTER ONLY)
ALT(SGPT): 16 U/L (ref 10–47)
AST: 20 U/L (ref 11–38)
Albumin: 3.7 g/dL (ref 3.3–5.5)
Alkaline Phosphatase: 128 U/L — ABNORMAL HIGH (ref 26–84)
BILIRUBIN TOTAL: 0.6 mg/dL (ref 0.20–1.60)
BUN, Bld: 9 mg/dL (ref 7–22)
CO2: 26 meq/L (ref 18–33)
Calcium: 9.9 mg/dL (ref 8.0–10.3)
Chloride: 103 mEq/L (ref 98–108)
Creat: 0.6 mg/dl (ref 0.6–1.2)
Glucose, Bld: 92 mg/dL (ref 73–118)
Potassium: 4.1 mEq/L (ref 3.3–4.7)
Sodium: 137 mEq/L (ref 128–145)
TOTAL PROTEIN: 7.5 g/dL (ref 6.4–8.1)

## 2014-04-12 LAB — CBC WITH DIFFERENTIAL (CANCER CENTER ONLY)
BASO#: 0 10*3/uL (ref 0.0–0.2)
BASO%: 0.1 % (ref 0.0–2.0)
EOS%: 0.3 % (ref 0.0–7.0)
Eosinophils Absolute: 0.1 10*3/uL (ref 0.0–0.5)
HCT: 36.9 % (ref 34.8–46.6)
HEMOGLOBIN: 12 g/dL (ref 11.6–15.9)
LYMPH#: 2.3 10*3/uL (ref 0.9–3.3)
LYMPH%: 13.9 % — ABNORMAL LOW (ref 14.0–48.0)
MCH: 31.2 pg (ref 26.0–34.0)
MCHC: 32.5 g/dL (ref 32.0–36.0)
MCV: 96 fL (ref 81–101)
MONO#: 1 10*3/uL — ABNORMAL HIGH (ref 0.1–0.9)
MONO%: 5.8 % (ref 0.0–13.0)
NEUT#: 13.2 10*3/uL — ABNORMAL HIGH (ref 1.5–6.5)
NEUT%: 79.9 % (ref 39.6–80.0)
Platelets: 217 10*3/uL (ref 145–400)
RBC: 3.85 10*6/uL (ref 3.70–5.32)
RDW: 13.5 % (ref 11.1–15.7)
WBC: 16.5 10*3/uL — ABNORMAL HIGH (ref 3.9–10.0)

## 2014-04-12 LAB — TECHNOLOGIST REVIEW CHCC SATELLITE

## 2014-04-12 MED ORDER — PEGINTERFERON ALFA-2A 180 MCG/ML ~~LOC~~ SOLN
81.0000 ug | SUBCUTANEOUS | Status: DC
  Administered 2014-04-12: 81 ug via SUBCUTANEOUS
  Filled 2014-04-12: qty 0.45

## 2014-04-12 MED ORDER — PEGINTERFERON ALFA-2A 180 MCG/0.5ML ~~LOC~~ KIT: 81.0000 ug | PACK | Freq: Once | SUBCUTANEOUS | Status: DC

## 2014-04-12 NOTE — Patient Instructions (Signed)
Peginterferon Alfa-2a Injection What is this medicine? PEGINTERFERON ALFA-2a (peg in ter [address] Puffer on AL fa 2 a) is a man-made drug that acts like a protein made by the body. It is used to treat chronic hepatitis B and C infections. This medicine may be used for other purposes; ask your health care provider or pharmacist if you have questions. COMMON BRAND NAME(S): Pegasys, Pegasys ProClick What should I tell my health care provider before I take this medicine? They need to know if you have any of these conditions: -alcoholism -auto-immune hepatitis -blood or bleeding disorders -colitis like ulcerative colitis or Crohn's disease -depression or other mental disorders -diabetes -drug abuse or addiction -heart disease -history of cancer -kidney disease -lupus -psoriasis -rheumatoid arthritis -thyroid problems -an unusual or allergic reaction to peginterferon, other medicines, foods, dyes, or preservatives like benzyl alcohol -pregnant or trying to get pregnant -breast-feeding How should I use this medicine? This medicine is for injection under the skin. Do NOT shake this medicine. You will be taught how to prepare and give this medicine. Use exactly as directed. Take your medicine at regular intervals. Do not take your medicine more often than directed. It is important that you put your used needles and syringes in a special sharps container. Do not put them in a trash can. If you do not have a sharps container, call your pharmacist or healthcare provider to get one. A special MedGuide will be given to you by the pharmacist with each prescription and refill. Be sure to read this information carefully each time. Talk to your pediatrician regarding the use of this medicine in children. While this drug may be prescribed for children as young as 5 years for selected conditions, precautions do apply. Overdosage: If you think you have taken too much of this medicine contact a poison control center or  emergency room at once. NOTE: This medicine is only for you. Do not share this medicine with others. What if I miss a dose? If you miss a dose and you remember within 2 days of when you missed your dose, take it as soon as you can. If it is late in the day, wait until the next day to take your dose. If more than 2 days have passed since you missed your dose ask your doctor what to do. Do not take double or extra doses. What may interact with this medicine? -alcohol -medicines for HIV like didanosine, lamivudine, stavudine, zidovudine -methadone -theophylline This list may not describe all possible interactions. Give your health care provider a list of all the medicines, herbs, non-prescription drugs, or dietary supplements you use. Also tell them if you smoke, drink alcohol, or use illegal drugs. Some items may interact with your medicine. What should I watch for while using this medicine? Visit your doctor or health care professional for regular checks on your progress. You will need regular blood checks. You may get drowsy or dizzy. Do not drive, use machinery, or do anything that needs mental alertness until you know how this medicine affects you. Do not stand or sit up quickly, especially if you are an older patient. This reduces the risk of dizzy or fainting spells. Alcohol may interfere with the effect of this medicine. Avoid alcoholic drinks. What side effects may I notice from receiving this medicine? Side effects that you should report to your doctor or health care professional as soon as possible: -allergic reactions like skin rash, itching or hives, swelling of the face, lips, or tongue -bloody  diarrhea -breathing problems -change in blood sugar -changes in vision -chest pain -confusion, trouble speaking or understanding -fast, irregular heartbeat -fever -high blood pressure -increased anger, depression, irritability, or thoughts of suicide -pain in lower back or stomach -pain,  tingling, numbness in the hands or feet -sudden numbness or weakness of the face, arm or leg -trouble passing urine or change in the amount of urine -trouble walking, dizziness, loss of balance or coordination -unusual bleeding or bruising Side effects that usually do not require medical attention (report to your doctor or health care professional if they continue or are bothersome): -aches, pains -dry, itchy skin -hair loss -loss of appetite -nausea, stomach upset -pain or swelling at site where injected -trouble sleeping -unusually weak or tired This list may not describe all possible side effects. Call your doctor for medical advice about side effects. You may report side effects to FDA at 1-800-FDA-1088. Where should I keep my medicine? Keep out of the reach of children. Store in a refrigerator between 2 and 8 degrees C (36 and 46 degrees F). Do not freeze. Protect from light. Do not leave this medicine out of the refrigerator for more than 24 hours. Throw away any unused medicine after the expiration date. NOTE: This sheet is a summary. It may not cover all possible information. If you have questions about this medicine, talk to your doctor, pharmacist, or health care provider.  2015, [employer]vier/Gold Standard. (2009-11-13 17:27:34)

## 2014-04-12 NOTE — Progress Notes (Signed)
[place]  [phone] [phone]) [phone]  ID: Brianna Fletcher OB: Dec 08, 1970 MR#: [id number] PNT#:[id number] Patient Care Team: Baruch Goldmann, PA-C as PCP - General (Physician Assistant)  DIAGNOSIS: Chronic myeloproliferative syndrome-JAK2 negative  INTERVAL HISTORY: Brianna Fletcher is here today for a follow-up. She is doing well. She has gone back to work and is enjoying it.  She denies fever, chills, n/v, cough, rash, headache, dizziness, SOB, chest pain, palpitations, abdominal pain, constipation, blood in urine or stool. She has had no bleeding or pain. She has diarrhea occassionally but is able to deal with it using imodium.  She has had no swelling, tenderness, numbness or tingling in her extremities.  Her appetite is good and she is drinking plenty of fluids. Her weight is stable at 230 lbs.    CURRENT TREATMENT: PEG-Interferon injection weekly  REVIEW OF SYSTEMS: All other 10 point review of systems is negative except for those issues mentioned above.  PAST MEDICAL HISTORY: Past Medical History  Diagnosis Date  . Glaucoma   . Arthritis   . Leukocytosis, unspecified 11/15/2012  . Chronic neutrophilia 11/15/2012  . Pruritus 11/15/2012  . Monocytosis 11/15/2012  . Myeloproliferative disorder 03/08/2013   PAST SURGICAL HISTORY: Past Surgical History  Procedure Laterality Date  . Tubal ligation  2000  . Eye surgery      laser eye surgery   FAMILY HISTORY No family history on file.  GYNECOLOGIC HISTORY:  No LMP recorded.   SOCIAL HISTORY:  History   Social History  . Marital Status: Divorced    Spouse Name: N/A    Number of Children: N/A  . Years of Education: N/A   Occupational History  . Not on file.   Social History Main Topics  . Smoking status: Never Smoker   . Smokeless tobacco: Never Used     Comment: never used tobacco  . Alcohol Use: No  . Drug Use: Not on file  . Sexual Activity: Not on file   Other Topics Concern  . Not on  file   Social History Narrative   ADVANCED DIRECTIVES: <no information>  HEALTH MAINTENANCE: History  Substance Use Topics  . Smoking status: Never Smoker   . Smokeless tobacco: Never Used     Comment: never used tobacco  . Alcohol Use: No   Colonoscopy: PAP: Bone density: Lipid panel:  No Known Allergies  Current Outpatient Prescriptions  Medication Sig Dispense Refill  . doxepin (SINEQUAN) 50 MG capsule TAKE 1 CAPSULE BY MOUTH AT BEDTIME 30 capsule 3   No current facility-administered medications for this visit.   OBJECTIVE: Filed Vitals:   04/12/14 0932  BP: 143/69  Pulse: 63  Temp: 98.2 F (36.8 C)  Resp: 14   Body mass index is 38.27 kg/(m^2). ECOG FS:0 - Asymptomatic Ocular: Sclerae unicteric, pupils equal, round and reactive to light Ear-nose-throat: Oropharynx clear, dentition fair Lymphatic: No cervical or supraclavicular adenopathy Lungs no rales or rhonchi, good excursion bilaterally Heart regular rate and rhythm, no murmur appreciated Abd soft, nontender, positive bowel sounds MSK no focal spinal tenderness, no joint edema Neuro: non-focal, well-oriented, appropriate affect Breasts: Deferred  LAB RESULTS: CMP     Component Value Date/Time   NA 137 04/12/2014 0926   NA 134* 11/20/2013 1116   K 4.1 04/12/2014 0926   K 4.1 11/20/2013 1116   CL 103 04/12/2014 0926   CL 102 11/20/2013 1116   CO2 26 04/12/2014 0926   CO2 23 11/20/2013 1116   GLUCOSE 92 04/12/2014  0926   GLUCOSE 93 11/20/2013 1116   BUN 9 04/12/2014 0926   BUN 9 11/20/2013 1116   CREATININE 0.6 04/12/2014 0926   CREATININE 0.68 11/20/2013 1116   CALCIUM 9.9 04/12/2014 0926   CALCIUM 9.9 11/20/2013 1116   PROT 7.5 04/12/2014 0926   PROT 6.9 11/20/2013 1116   ALBUMIN 4.3 11/20/2013 1116   AST 20 04/12/2014 0926   AST 13 11/20/2013 1116   ALT 16 04/12/2014 0926   ALT 9 11/20/2013 1116   ALKPHOS 128* 04/12/2014 0926   ALKPHOS 123* 11/20/2013 1116   BILITOT 0.60 04/12/2014  0926   BILITOT 0.6 11/20/2013 1116   GFRNONAA >60 05/29/2010 1850   GFRAA  05/29/2010 1850    >60        The eGFR has been calculated using the MDRD equation. This calculation has not been validated in all clinical situations. eGFR's persistently <60 mL/min signify possible Chronic Kidney Disease.   No results found for: SPEP Lab Results  Component Value Date   WBC 16.5* 04/12/2014   NEUTROABS 13.2* 04/12/2014   HGB 12.0 04/12/2014   HCT 36.9 04/12/2014   MCV 96 04/12/2014   PLT 217 04/12/2014   No results found for: LABCA2 No components found for: FEOFH219 No results for input(s): INR in the last 168 hours.  STUDIES: None  ASSESSMENT/PLAN: Brianna Fletcher is 44 year old female with myeloproliferative syndrome. She is doing well on the interferon injections. She is doing well and is asymptomatic at this time.  She will get her injection today and weekly.  We will see her in 3 weeks for a follow-up and labs.  She knows to cal here with any questions or concerns and to go to the ED in the event of an emergency. We can certainly see her sooner if need be.   Eliezer Bottom, NP 04/12/2014 10:06 AM

## 2014-04-18 ENCOUNTER — Other Ambulatory Visit: Payer: BC Managed Care – PPO | Admitting: Lab

## 2014-04-18 ENCOUNTER — Ambulatory Visit: Payer: BC Managed Care – PPO

## 2014-04-19 ENCOUNTER — Ambulatory Visit (HOSPITAL_BASED_OUTPATIENT_CLINIC_OR_DEPARTMENT_OTHER): Payer: BLUE CROSS/BLUE SHIELD

## 2014-04-19 ENCOUNTER — Other Ambulatory Visit (HOSPITAL_BASED_OUTPATIENT_CLINIC_OR_DEPARTMENT_OTHER): Payer: BLUE CROSS/BLUE SHIELD | Admitting: Lab

## 2014-04-19 DIAGNOSIS — D471 Chronic myeloproliferative disease: Secondary | ICD-10-CM

## 2014-04-19 LAB — CMP (CANCER CENTER ONLY)
ALT(SGPT): 11 U/L (ref 10–47)
AST: 22 U/L (ref 11–38)
Albumin: 3.9 g/dL (ref 3.3–5.5)
Alkaline Phosphatase: 134 U/L — ABNORMAL HIGH (ref 26–84)
BILIRUBIN TOTAL: 0.6 mg/dL (ref 0.20–1.60)
BUN: 8 mg/dL (ref 7–22)
CALCIUM: 10.1 mg/dL (ref 8.0–10.3)
CO2: 24 mEq/L (ref 18–33)
CREATININE: 0.6 mg/dL (ref 0.6–1.2)
Chloride: 101 mEq/L (ref 98–108)
GLUCOSE: 115 mg/dL (ref 73–118)
Potassium: 3.7 mEq/L (ref 3.3–4.7)
SODIUM: 142 meq/L (ref 128–145)
Total Protein: 7.5 g/dL (ref 6.4–8.1)

## 2014-04-19 LAB — CBC WITH DIFFERENTIAL (CANCER CENTER ONLY)
BASO#: 0 10*3/uL (ref 0.0–0.2)
BASO%: 0.1 % (ref 0.0–2.0)
EOS%: 0.2 % (ref 0.0–7.0)
Eosinophils Absolute: 0 10*3/uL (ref 0.0–0.5)
HCT: 35.8 % (ref 34.8–46.6)
HGB: 11.8 g/dL (ref 11.6–15.9)
LYMPH#: 2.6 10*3/uL (ref 0.9–3.3)
LYMPH%: 14.9 % (ref 14.0–48.0)
MCH: 31.3 pg (ref 26.0–34.0)
MCHC: 33 g/dL (ref 32.0–36.0)
MCV: 95 fL (ref 81–101)
MONO#: 0.9 10*3/uL (ref 0.1–0.9)
MONO%: 4.8 % (ref 0.0–13.0)
NEUT#: 14 10*3/uL — ABNORMAL HIGH (ref 1.5–6.5)
NEUT%: 80 % (ref 39.6–80.0)
Platelets: 198 10*3/uL (ref 145–400)
RBC: 3.77 10*6/uL (ref 3.70–5.32)
RDW: 13.9 % (ref 11.1–15.7)
WBC: 17.5 10*3/uL — ABNORMAL HIGH (ref 3.9–10.0)

## 2014-04-19 MED ORDER — PEGINTERFERON ALFA-2A 180 MCG/0.5ML ~~LOC~~ KIT: 81.0000 ug | PACK | Freq: Once | SUBCUTANEOUS | Status: DC

## 2014-04-19 MED ORDER — PEGINTERFERON ALFA-2A 180 MCG/ML ~~LOC~~ SOLN
81.0000 ug | SUBCUTANEOUS | Status: DC
  Administered 2014-04-19: 81 ug via SUBCUTANEOUS
  Filled 2014-04-19: qty 0.45

## 2014-04-25 ENCOUNTER — Other Ambulatory Visit: Payer: Self-pay | Admitting: *Deleted

## 2014-04-25 DIAGNOSIS — D72828 Other elevated white blood cell count: Secondary | ICD-10-CM

## 2014-04-26 ENCOUNTER — Ambulatory Visit (HOSPITAL_BASED_OUTPATIENT_CLINIC_OR_DEPARTMENT_OTHER): Payer: BLUE CROSS/BLUE SHIELD | Admitting: Hematology & Oncology

## 2014-04-26 ENCOUNTER — Encounter: Payer: Self-pay | Admitting: Pharmacist

## 2014-04-26 ENCOUNTER — Ambulatory Visit (HOSPITAL_BASED_OUTPATIENT_CLINIC_OR_DEPARTMENT_OTHER): Payer: BLUE CROSS/BLUE SHIELD

## 2014-04-26 ENCOUNTER — Other Ambulatory Visit (HOSPITAL_BASED_OUTPATIENT_CLINIC_OR_DEPARTMENT_OTHER): Payer: BLUE CROSS/BLUE SHIELD | Admitting: Lab

## 2014-04-26 VITALS — BP 128/70 | HR 71 | Temp 97.6°F | Resp 18 | Wt 232.0 lb

## 2014-04-26 DIAGNOSIS — D471 Chronic myeloproliferative disease: Secondary | ICD-10-CM

## 2014-04-26 DIAGNOSIS — Z5112 Encounter for antineoplastic immunotherapy: Secondary | ICD-10-CM

## 2014-04-26 DIAGNOSIS — D72828 Other elevated white blood cell count: Secondary | ICD-10-CM

## 2014-04-26 LAB — CMP (CANCER CENTER ONLY)
ALT(SGPT): 16 U/L (ref 10–47)
AST: 21 U/L (ref 11–38)
Albumin: 3.8 g/dL (ref 3.3–5.5)
Alkaline Phosphatase: 133 U/L — ABNORMAL HIGH (ref 26–84)
BUN, Bld: 7 mg/dL (ref 7–22)
CHLORIDE: 102 meq/L (ref 98–108)
CO2: 25 mEq/L (ref 18–33)
Calcium: 9.9 mg/dL (ref 8.0–10.3)
Creat: 0.4 mg/dl — ABNORMAL LOW (ref 0.6–1.2)
GLUCOSE: 82 mg/dL (ref 73–118)
Potassium: 3.8 mEq/L (ref 3.3–4.7)
Sodium: 139 mEq/L (ref 128–145)
Total Bilirubin: 0.5 mg/dl (ref 0.20–1.60)
Total Protein: 7.5 g/dL (ref 6.4–8.1)

## 2014-04-26 LAB — CBC WITH DIFFERENTIAL (CANCER CENTER ONLY)
BASO#: 0 10*3/uL (ref 0.0–0.2)
BASO%: 0.1 % (ref 0.0–2.0)
EOS%: 0.2 % (ref 0.0–7.0)
Eosinophils Absolute: 0 10*3/uL (ref 0.0–0.5)
HCT: 36.7 % (ref 34.8–46.6)
HEMOGLOBIN: 11.9 g/dL (ref 11.6–15.9)
LYMPH#: 2.4 10*3/uL (ref 0.9–3.3)
LYMPH%: 14.7 % (ref 14.0–48.0)
MCH: 30.8 pg (ref 26.0–34.0)
MCHC: 32.4 g/dL (ref 32.0–36.0)
MCV: 95 fL (ref 81–101)
MONO#: 0.8 10*3/uL (ref 0.1–0.9)
MONO%: 4.8 % (ref 0.0–13.0)
NEUT%: 80.2 % — ABNORMAL HIGH (ref 39.6–80.0)
NEUTROS ABS: 13.1 10*3/uL — AB (ref 1.5–6.5)
Platelets: 202 10*3/uL (ref 145–400)
RBC: 3.86 10*6/uL (ref 3.70–5.32)
RDW: 14.1 % (ref 11.1–15.7)
WBC: 16.4 10*3/uL — AB (ref 3.9–10.0)

## 2014-04-26 LAB — CHCC SATELLITE - SMEAR

## 2014-04-26 LAB — LACTATE DEHYDROGENASE: LDH: 134 U/L (ref 94–250)

## 2014-04-26 MED ORDER — ACETAMINOPHEN 325 MG PO TABS: 650.0000 mg | ORAL_TABLET | Freq: Once | ORAL | Status: AC

## 2014-04-26 MED ORDER — PEGINTERFERON ALFA-2A 180 MCG/ML ~~LOC~~ SOLN
81.0000 ug | SUBCUTANEOUS | Status: DC
  Filled 2014-04-26: qty 0.45

## 2014-04-26 MED ORDER — PEGINTERFERON ALFA-2A 180 MCG/ML ~~LOC~~ SOLN
81.0000 ug | SUBCUTANEOUS | Status: AC
  Administered 2014-04-26: 81 ug via SUBCUTANEOUS
  Filled 2014-04-26: qty 0.45

## 2014-04-26 MED ORDER — PEGINTERFERON ALFA-2A 180 MCG/0.5ML ~~LOC~~ KIT: 81.0000 ug | PACK | Freq: Once | SUBCUTANEOUS | Status: DC

## 2014-04-26 NOTE — Patient Instructions (Signed)
Peginterferon Alfa-2a Injection What is this medicine? PEGINTERFERON ALFA-2a (peg in ter [address] Puffer on AL fa 2 a) is a man-made drug that acts like a protein made by the body. It is used to treat chronic hepatitis B and C infections. This medicine may be used for other purposes; ask your health care provider or pharmacist if you have questions. COMMON BRAND NAME(S): Pegasys, Pegasys ProClick What should I tell my health care provider before I take this medicine? They need to know if you have any of these conditions: -alcoholism -auto-immune hepatitis -blood or bleeding disorders -colitis like ulcerative colitis or Crohn's disease -depression or other mental disorders -diabetes -drug abuse or addiction -heart disease -history of cancer -kidney disease -lupus -psoriasis -rheumatoid arthritis -thyroid problems -an unusual or allergic reaction to peginterferon, other medicines, foods, dyes, or preservatives like benzyl alcohol -pregnant or trying to get pregnant -breast-feeding How should I use this medicine? This medicine is for injection under the skin. Do NOT shake this medicine. You will be taught how to prepare and give this medicine. Use exactly as directed. Take your medicine at regular intervals. Do not take your medicine more often than directed. It is important that you put your used needles and syringes in a special sharps container. Do not put them in a trash can. If you do not have a sharps container, call your pharmacist or healthcare provider to get one. A special MedGuide will be given to you by the pharmacist with each prescription and refill. Be sure to read this information carefully each time. Talk to your pediatrician regarding the use of this medicine in children. While this drug may be prescribed for children as young as 5 years for selected conditions, precautions do apply. Overdosage: If you think you have taken too much of this medicine contact a poison control center or  emergency room at once. NOTE: This medicine is only for you. Do not share this medicine with others. What if I miss a dose? If you miss a dose and you remember within 2 days of when you missed your dose, take it as soon as you can. If it is late in the day, wait until the next day to take your dose. If more than 2 days have passed since you missed your dose ask your doctor what to do. Do not take double or extra doses. What may interact with this medicine? -alcohol -medicines for HIV like didanosine, lamivudine, stavudine, zidovudine -methadone -theophylline This list may not describe all possible interactions. Give your health care provider a list of all the medicines, herbs, non-prescription drugs, or dietary supplements you use. Also tell them if you smoke, drink alcohol, or use illegal drugs. Some items may interact with your medicine. What should I watch for while using this medicine? Visit your doctor or health care professional for regular checks on your progress. You will need regular blood checks. You may get drowsy or dizzy. Do not drive, use machinery, or do anything that needs mental alertness until you know how this medicine affects you. Do not stand or sit up quickly, especially if you are an older patient. This reduces the risk of dizzy or fainting spells. Alcohol may interfere with the effect of this medicine. Avoid alcoholic drinks. What side effects may I notice from receiving this medicine? Side effects that you should report to your doctor or health care professional as soon as possible: -allergic reactions like skin rash, itching or hives, swelling of the face, lips, or tongue -bloody  diarrhea -breathing problems -change in blood sugar -changes in vision -chest pain -confusion, trouble speaking or understanding -fast, irregular heartbeat -fever -high blood pressure -increased anger, depression, irritability, or thoughts of suicide -pain in lower back or stomach -pain,  tingling, numbness in the hands or feet -sudden numbness or weakness of the face, arm or leg -trouble passing urine or change in the amount of urine -trouble walking, dizziness, loss of balance or coordination -unusual bleeding or bruising Side effects that usually do not require medical attention (report to your doctor or health care professional if they continue or are bothersome): -aches, pains -dry, itchy skin -hair loss -loss of appetite -nausea, stomach upset -pain or swelling at site where injected -trouble sleeping -unusually weak or tired This list may not describe all possible side effects. Call your doctor for medical advice about side effects. You may report side effects to FDA at 1-800-FDA-1088. Where should I keep my medicine? Keep out of the reach of children. Store in a refrigerator between 2 and 8 degrees C (36 and 46 degrees F). Do not freeze. Protect from light. Do not leave this medicine out of the refrigerator for more than 24 hours. Throw away any unused medicine after the expiration date. NOTE: This sheet is a summary. It may not cover all possible information. If you have questions about this medicine, talk to your doctor, pharmacist, or health care provider.  2015, [employer]vier/Gold Standard. (2009-11-13 17:27:34)

## 2014-04-26 NOTE — Progress Notes (Signed)
Hematology and Oncology Follow Up Visit  RASA DEGRAZIA [id number] Jun 03, 1970 44 y.o. 04/26/2014   Principle Diagnosis:  Chronic myeloproliferative syndrome-Triple negative  Current Therapy:   Peg-Interferon q weekly     Interim History:  Ms.  Brianna Fletcher is back for followup. She has tolerated the peg-interferon well. She really looks good. [name] working. She is working and doing well with this.  Unfortunately, an aunt passed away on her birthday. Her car broke down on her birthday. It was just a tough birthday for her.  She had a good Christmas. Her son came out from [place]..   I'm just thankful that the interferon has worked so well. [name] tolerating it well. Hopefully, we might be able to pull back the frequency to every couple weeks instead of weekly dosing. I probably would think about this if her blood counts continue to improve in 2 or 3 months.   I sent off a comprehensive genetic panel for myeloproliferative disorders. This is come back normal.  She has occasional pain in the right hip. We've done CAT scans of this area. It is shown some arthritis.  [name] had no rashes. [name] had no fever. Her appetite has been good. There has been no change in bowel or bladder habits.  She still has some pruritus. It clearly is not as prominent or as prolonged as before.  Overall, her performance status is ECOG 1. Medications:  Current outpatient prescriptions:  .  doxepin (SINEQUAN) 50 MG capsule, TAKE 1 CAPSULE BY MOUTH AT BEDTIME, Disp: 30 capsule, Rfl: 3 No current facility-administered medications for this visit.  Facility-Administered Medications Ordered in Other Visits:  .  peginterferon alfa-2a (PEGASYS) injection 81 mcg, 81 mcg, Subcutaneous, Weekly, [name], MD, 81 mcg at 04/19/14 1536  Allergies: No Known Allergies  Past Medical History, Surgical history, Social history, and Family History were reviewed and updated.  Review of Systems: As above  Physical  Exam:  weight is 232 lb (105.235 kg). Her oral temperature is 97.6 F (36.4 C). Her blood pressure is 128/70 and her pulse is 71. Her respiration is 18.   Well-developed and well-nourished white female. Head and neck exam shows no ocular or oral lesions. She has no palpable cervical or supraclavicular lymph nodes. Lungs are clear. Cardiac exam regular rate and rhythm with no murmurs rubs or bruits. Abdomen is soft. She has good bowel sounds. There is no fluid wave. There is no palpable liver or spleen tip. Back exam shows no tenderness over the spine or ribs. There is some tenderness over the right hip area. She has some slight decreased range of motion of the right hip. Extremities shows no clubbing, cyanosis or edema. Skin exam no rashes, ecchymosis or petechia. Neurological exam shows no focal neurological deficits.  Lab Results  Component Value Date   WBC 16.4* 04/26/2014   HGB 11.9 04/26/2014   HCT 36.7 04/26/2014   MCV 95 04/26/2014   PLT 202 04/26/2014     Chemistry      Component Value Date/Time   NA 139 04/26/2014 1401   NA 134* 11/20/2013 1116   K 3.8 04/26/2014 1401   K 4.1 11/20/2013 1116   CL 102 04/26/2014 1401   CL 102 11/20/2013 1116   CO2 25 04/26/2014 1401   CO2 23 11/20/2013 1116   BUN 7 04/26/2014 1401   BUN 9 11/20/2013 1116   CREATININE 0.4* 04/26/2014 1401   CREATININE 0.68 11/20/2013 1116      Component Value Date/Time  CALCIUM 9.9 04/26/2014 1401   CALCIUM 9.9 11/20/2013 1116   ALKPHOS 133* 04/26/2014 1401   ALKPHOS 123* 11/20/2013 1116   AST 21 04/26/2014 1401   AST 13 11/20/2013 1116   ALT 16 04/26/2014 1401   ALT 9 11/20/2013 1116   BILITOT 0.50 04/26/2014 1401   BILITOT 0.6 11/20/2013 1116         Impression and Plan: [name] is 44 year old female. She has a myeloproliferative syndrome. So far, our workup has been totally negative.  Her blood counts continue to improve. Her white cell count continues to come down. Her hemoglobin is  holding steady. Her platelet count is also holding steady.   I'm glad that the interferon is keeping her white cell count "in check". Her blood smear certainly does not look unusual outside of the increased in myeloid cells. These appear mature.  We will continue the interferon weekly. Again, she is tolerating this pretty well.  I will plan to see her back in about 4 weeks. If all looks stable to 4 weeks, then we will consider removing the interferon to every other week.  I spent about 30 minutes with her.   [name], MD 2/5/20162:57 PM

## 2014-05-03 ENCOUNTER — Other Ambulatory Visit: Payer: BLUE CROSS/BLUE SHIELD | Admitting: Lab

## 2014-05-03 ENCOUNTER — Other Ambulatory Visit (HOSPITAL_BASED_OUTPATIENT_CLINIC_OR_DEPARTMENT_OTHER): Payer: BLUE CROSS/BLUE SHIELD | Admitting: Lab

## 2014-05-03 ENCOUNTER — Ambulatory Visit (HOSPITAL_BASED_OUTPATIENT_CLINIC_OR_DEPARTMENT_OTHER): Payer: BLUE CROSS/BLUE SHIELD

## 2014-05-03 ENCOUNTER — Inpatient Hospital Stay: Payer: BLUE CROSS/BLUE SHIELD

## 2014-05-03 DIAGNOSIS — D471 Chronic myeloproliferative disease: Secondary | ICD-10-CM

## 2014-05-03 LAB — CBC WITH DIFFERENTIAL (CANCER CENTER ONLY)
BASO#: 0 10*3/uL (ref 0.0–0.2)
BASO%: 0 % (ref 0.0–2.0)
EOS%: 0.4 % (ref 0.0–7.0)
Eosinophils Absolute: 0.1 10*3/uL (ref 0.0–0.5)
HCT: 38.3 % (ref 34.8–46.6)
HGB: 12.5 g/dL (ref 11.6–15.9)
LYMPH#: 2.2 10*3/uL (ref 0.9–3.3)
LYMPH%: 13.4 % — ABNORMAL LOW (ref 14.0–48.0)
MCH: 31 pg (ref 26.0–34.0)
MCHC: 32.6 g/dL (ref 32.0–36.0)
MCV: 95 fL (ref 81–101)
MONO#: 0.5 10*3/uL (ref 0.1–0.9)
MONO%: 3.1 % (ref 0.0–13.0)
NEUT#: 13.6 10*3/uL — ABNORMAL HIGH (ref 1.5–6.5)
NEUT%: 83.1 % — ABNORMAL HIGH (ref 39.6–80.0)
Platelets: 230 10*3/uL (ref 145–400)
RBC: 4.03 10*6/uL (ref 3.70–5.32)
RDW: 13.7 % (ref 11.1–15.7)
WBC: 16.3 10*3/uL — AB (ref 3.9–10.0)

## 2014-05-03 LAB — LACTATE DEHYDROGENASE: LDH: 113 U/L (ref 94–250)

## 2014-05-03 LAB — COMPREHENSIVE METABOLIC PANEL
ALT: 16 U/L (ref 0–35)
AST: 22 U/L (ref 0–37)
Albumin: 4.3 g/dL (ref 3.5–5.2)
Alkaline Phosphatase: 153 U/L — ABNORMAL HIGH (ref 39–117)
BILIRUBIN TOTAL: 0.5 mg/dL (ref 0.2–1.2)
BUN: 9 mg/dL (ref 6–23)
CO2: 23 mEq/L (ref 19–32)
Calcium: 10.2 mg/dL (ref 8.4–10.5)
Chloride: 104 mEq/L (ref 96–112)
Creatinine, Ser: 0.68 mg/dL (ref 0.50–1.10)
Glucose, Bld: 144 mg/dL — ABNORMAL HIGH (ref 70–99)
Potassium: 3.5 mEq/L (ref 3.5–5.3)
Sodium: 139 mEq/L (ref 135–145)
Total Protein: 7.6 g/dL (ref 6.0–8.3)

## 2014-05-03 LAB — CHCC SATELLITE - SMEAR

## 2014-05-03 MED ORDER — PEGINTERFERON ALFA-2A 180 MCG/ML ~~LOC~~ SOLN
81.0000 ug | SUBCUTANEOUS | Status: DC
  Administered 2014-05-03: 81 ug via SUBCUTANEOUS
  Filled 2014-05-03 (×2): qty 0.45

## 2014-05-03 MED ORDER — ACETAMINOPHEN 325 MG PO TABS
ORAL_TABLET | ORAL | Status: AC
  Filled 2014-05-03: qty 2

## 2014-05-03 MED ORDER — ACETAMINOPHEN 325 MG PO TABS
650.0000 mg | ORAL_TABLET | Freq: Once | ORAL | Status: AC
  Administered 2014-05-03: 650 mg via ORAL

## 2014-05-03 NOTE — Patient Instructions (Signed)
Peginterferon Alfa-2a Injection What is this medicine? PEGINTERFERON ALFA-2a (peg in ter [address] Puffer on AL fa 2 a) is a man-made drug that acts like a protein made by the body. It is used to treat chronic hepatitis B and C infections. This medicine may be used for other purposes; ask your health care provider or pharmacist if you have questions. COMMON BRAND NAME(S): Pegasys, Pegasys ProClick What should I tell my health care provider before I take this medicine? They need to know if you have any of these conditions: -alcoholism -auto-immune hepatitis -blood or bleeding disorders -colitis like ulcerative colitis or Crohn's disease -depression or other mental disorders -diabetes -drug abuse or addiction -heart disease -history of cancer -kidney disease -lupus -psoriasis -rheumatoid arthritis -thyroid problems -an unusual or allergic reaction to peginterferon, other medicines, foods, dyes, or preservatives like benzyl alcohol -pregnant or trying to get pregnant -breast-feeding How should I use this medicine? This medicine is for injection under the skin. Do NOT shake this medicine. You will be taught how to prepare and give this medicine. Use exactly as directed. Take your medicine at regular intervals. Do not take your medicine more often than directed. It is important that you put your used needles and syringes in a special sharps container. Do not put them in a trash can. If you do not have a sharps container, call your pharmacist or healthcare provider to get one. A special MedGuide will be given to you by the pharmacist with each prescription and refill. Be sure to read this information carefully each time. Talk to your pediatrician regarding the use of this medicine in children. While this drug may be prescribed for children as young as 5 years for selected conditions, precautions do apply. Overdosage: If you think you have taken too much of this medicine contact a poison control center or  emergency room at once. NOTE: This medicine is only for you. Do not share this medicine with others. What if I miss a dose? If you miss a dose and you remember within 2 days of when you missed your dose, take it as soon as you can. If it is late in the day, wait until the next day to take your dose. If more than 2 days have passed since you missed your dose ask your doctor what to do. Do not take double or extra doses. What may interact with this medicine? -alcohol -medicines for HIV like didanosine, lamivudine, stavudine, zidovudine -methadone -theophylline This list may not describe all possible interactions. Give your health care provider a list of all the medicines, herbs, non-prescription drugs, or dietary supplements you use. Also tell them if you smoke, drink alcohol, or use illegal drugs. Some items may interact with your medicine. What should I watch for while using this medicine? Visit your doctor or health care professional for regular checks on your progress. You will need regular blood checks. You may get drowsy or dizzy. Do not drive, use machinery, or do anything that needs mental alertness until you know how this medicine affects you. Do not stand or sit up quickly, especially if you are an older patient. This reduces the risk of dizzy or fainting spells. Alcohol may interfere with the effect of this medicine. Avoid alcoholic drinks. What side effects may I notice from receiving this medicine? Side effects that you should report to your doctor or health care professional as soon as possible: -allergic reactions like skin rash, itching or hives, swelling of the face, lips, or tongue -bloody  diarrhea -breathing problems -change in blood sugar -changes in vision -chest pain -confusion, trouble speaking or understanding -fast, irregular heartbeat -fever -high blood pressure -increased anger, depression, irritability, or thoughts of suicide -pain in lower back or stomach -pain,  tingling, numbness in the hands or feet -sudden numbness or weakness of the face, arm or leg -trouble passing urine or change in the amount of urine -trouble walking, dizziness, loss of balance or coordination -unusual bleeding or bruising Side effects that usually do not require medical attention (report to your doctor or health care professional if they continue or are bothersome): -aches, pains -dry, itchy skin -hair loss -loss of appetite -nausea, stomach upset -pain or swelling at site where injected -trouble sleeping -unusually weak or tired This list may not describe all possible side effects. Call your doctor for medical advice about side effects. You may report side effects to FDA at 1-800-FDA-1088. Where should I keep my medicine? Keep out of the reach of children. Store in a refrigerator between 2 and 8 degrees C (36 and 46 degrees F). Do not freeze. Protect from light. Do not leave this medicine out of the refrigerator for more than 24 hours. Throw away any unused medicine after the expiration date. NOTE: This sheet is a summary. It may not cover all possible information. If you have questions about this medicine, talk to your doctor, pharmacist, or health care provider.  2015, [employer]vier/Gold Standard. (2009-11-13 17:27:34)

## 2014-05-09 ENCOUNTER — Other Ambulatory Visit: Payer: Self-pay | Admitting: Nurse Practitioner

## 2014-05-09 DIAGNOSIS — D471 Chronic myeloproliferative disease: Secondary | ICD-10-CM

## 2014-05-10 ENCOUNTER — Ambulatory Visit (HOSPITAL_BASED_OUTPATIENT_CLINIC_OR_DEPARTMENT_OTHER): Payer: BLUE CROSS/BLUE SHIELD

## 2014-05-10 ENCOUNTER — Other Ambulatory Visit (HOSPITAL_BASED_OUTPATIENT_CLINIC_OR_DEPARTMENT_OTHER): Payer: BLUE CROSS/BLUE SHIELD | Admitting: Lab

## 2014-05-10 DIAGNOSIS — D471 Chronic myeloproliferative disease: Secondary | ICD-10-CM

## 2014-05-10 LAB — CBC WITH DIFFERENTIAL (CANCER CENTER ONLY)
BASO#: 0 10*3/uL (ref 0.0–0.2)
BASO%: 0.1 % (ref 0.0–2.0)
EOS%: 0.2 % (ref 0.0–7.0)
Eosinophils Absolute: 0 10*3/uL (ref 0.0–0.5)
HEMATOCRIT: 35.7 % (ref 34.8–46.6)
HGB: 11.7 g/dL (ref 11.6–15.9)
LYMPH#: 2.3 10*3/uL (ref 0.9–3.3)
LYMPH%: 14.1 % (ref 14.0–48.0)
MCH: 31 pg (ref 26.0–34.0)
MCHC: 32.8 g/dL (ref 32.0–36.0)
MCV: 95 fL (ref 81–101)
MONO#: 0.8 10*3/uL (ref 0.1–0.9)
MONO%: 5 % (ref 0.0–13.0)
NEUT#: 13.3 10*3/uL — ABNORMAL HIGH (ref 1.5–6.5)
NEUT%: 80.6 % — AB (ref 39.6–80.0)
PLATELETS: 186 10*3/uL (ref 145–400)
RBC: 3.77 10*6/uL (ref 3.70–5.32)
RDW: 13.7 % (ref 11.1–15.7)
WBC: 16.6 10*3/uL — ABNORMAL HIGH (ref 3.9–10.0)

## 2014-05-10 LAB — CMP (CANCER CENTER ONLY)
ALBUMIN: 4 g/dL (ref 3.3–5.5)
ALT(SGPT): 12 U/L (ref 10–47)
AST: 18 U/L (ref 11–38)
Alkaline Phosphatase: 132 U/L — ABNORMAL HIGH (ref 26–84)
BILIRUBIN TOTAL: 0.6 mg/dL (ref 0.20–1.60)
BUN, Bld: 10 mg/dL (ref 7–22)
CO2: 26 meq/L (ref 18–33)
Calcium: 10.1 mg/dL (ref 8.0–10.3)
Chloride: 101 mEq/L (ref 98–108)
Creat: 0.7 mg/dl (ref 0.6–1.2)
GLUCOSE: 103 mg/dL (ref 73–118)
POTASSIUM: 3.8 meq/L (ref 3.3–4.7)
SODIUM: 142 meq/L (ref 128–145)
Total Protein: 7.7 g/dL (ref 6.4–8.1)

## 2014-05-10 LAB — LACTATE DEHYDROGENASE: LDH: 144 U/L (ref 94–250)

## 2014-05-10 MED ORDER — PEGINTERFERON ALFA-2A 180 MCG/ML ~~LOC~~ SOLN
81.0000 ug | SUBCUTANEOUS | Status: DC
  Administered 2014-05-10: 81 ug via SUBCUTANEOUS
  Filled 2014-05-10: qty 0.45

## 2014-05-10 MED ORDER — ACETAMINOPHEN 325 MG PO TABS
650.0000 mg | ORAL_TABLET | Freq: Once | ORAL | Status: AC
  Administered 2014-05-10: 650 mg via ORAL

## 2014-05-10 MED ORDER — ACETAMINOPHEN 325 MG PO TABS
ORAL_TABLET | ORAL | Status: AC
  Filled 2014-05-10: qty 2

## 2014-05-10 NOTE — Patient Instructions (Signed)
Peginterferon Alfa-2a Injection What is this medicine? PEGINTERFERON ALFA-2a (peg in ter [address] Puffer on AL fa 2 a) is a man-made drug that acts like a protein made by the body. It is used to treat chronic hepatitis B and C infections. This medicine may be used for other purposes; ask your health care provider or pharmacist if you have questions. COMMON BRAND NAME(S): Pegasys, Pegasys ProClick What should I tell my health care provider before I take this medicine? They need to know if you have any of these conditions: -alcoholism -auto-immune hepatitis -blood or bleeding disorders -colitis like ulcerative colitis or Crohn's disease -depression or other mental disorders -diabetes -drug abuse or addiction -heart disease -history of cancer -kidney disease -lupus -psoriasis -rheumatoid arthritis -thyroid problems -an unusual or allergic reaction to peginterferon, other medicines, foods, dyes, or preservatives like benzyl alcohol -pregnant or trying to get pregnant -breast-feeding How should I use this medicine? This medicine is for injection under the skin. Do NOT shake this medicine. You will be taught how to prepare and give this medicine. Use exactly as directed. Take your medicine at regular intervals. Do not take your medicine more often than directed. It is important that you put your used needles and syringes in a special sharps container. Do not put them in a trash can. If you do not have a sharps container, call your pharmacist or healthcare provider to get one. A special MedGuide will be given to you by the pharmacist with each prescription and refill. Be sure to read this information carefully each time. Talk to your pediatrician regarding the use of this medicine in children. While this drug may be prescribed for children as young as 5 years for selected conditions, precautions do apply. Overdosage: If you think you have taken too much of this medicine contact a poison control center or  emergency room at once. NOTE: This medicine is only for you. Do not share this medicine with others. What if I miss a dose? If you miss a dose and you remember within 2 days of when you missed your dose, take it as soon as you can. If it is late in the day, wait until the next day to take your dose. If more than 2 days have passed since you missed your dose ask your doctor what to do. Do not take double or extra doses. What may interact with this medicine? -alcohol -medicines for HIV like didanosine, lamivudine, stavudine, zidovudine -methadone -theophylline This list may not describe all possible interactions. Give your health care provider a list of all the medicines, herbs, non-prescription drugs, or dietary supplements you use. Also tell them if you smoke, drink alcohol, or use illegal drugs. Some items may interact with your medicine. What should I watch for while using this medicine? Visit your doctor or health care professional for regular checks on your progress. You will need regular blood checks. You may get drowsy or dizzy. Do not drive, use machinery, or do anything that needs mental alertness until you know how this medicine affects you. Do not stand or sit up quickly, especially if you are an older patient. This reduces the risk of dizzy or fainting spells. Alcohol may interfere with the effect of this medicine. Avoid alcoholic drinks. What side effects may I notice from receiving this medicine? Side effects that you should report to your doctor or health care professional as soon as possible: -allergic reactions like skin rash, itching or hives, swelling of the face, lips, or tongue -bloody  diarrhea -breathing problems -change in blood sugar -changes in vision -chest pain -confusion, trouble speaking or understanding -fast, irregular heartbeat -fever -high blood pressure -increased anger, depression, irritability, or thoughts of suicide -pain in lower back or stomach -pain,  tingling, numbness in the hands or feet -sudden numbness or weakness of the face, arm or leg -trouble passing urine or change in the amount of urine -trouble walking, dizziness, loss of balance or coordination -unusual bleeding or bruising Side effects that usually do not require medical attention (report to your doctor or health care professional if they continue or are bothersome): -aches, pains -dry, itchy skin -hair loss -loss of appetite -nausea, stomach upset -pain or swelling at site where injected -trouble sleeping -unusually weak or tired This list may not describe all possible side effects. Call your doctor for medical advice about side effects. You may report side effects to FDA at 1-800-FDA-1088. Where should I keep my medicine? Keep out of the reach of children. Store in a refrigerator between 2 and 8 degrees C (36 and 46 degrees F). Do not freeze. Protect from light. Do not leave this medicine out of the refrigerator for more than 24 hours. Throw away any unused medicine after the expiration date. NOTE: This sheet is a summary. It may not cover all possible information. If you have questions about this medicine, talk to your doctor, pharmacist, or health care provider.  2015, [employer]vier/Gold Standard. (2009-11-13 17:27:34)

## 2014-05-16 ENCOUNTER — Other Ambulatory Visit: Payer: Self-pay | Admitting: Nurse Practitioner

## 2014-05-16 DIAGNOSIS — D72828 Other elevated white blood cell count: Secondary | ICD-10-CM

## 2014-05-16 DIAGNOSIS — D471 Chronic myeloproliferative disease: Secondary | ICD-10-CM

## 2014-05-17 ENCOUNTER — Ambulatory Visit (HOSPITAL_BASED_OUTPATIENT_CLINIC_OR_DEPARTMENT_OTHER): Payer: BLUE CROSS/BLUE SHIELD

## 2014-05-17 ENCOUNTER — Other Ambulatory Visit (HOSPITAL_BASED_OUTPATIENT_CLINIC_OR_DEPARTMENT_OTHER): Payer: BLUE CROSS/BLUE SHIELD | Admitting: Lab

## 2014-05-17 DIAGNOSIS — D471 Chronic myeloproliferative disease: Secondary | ICD-10-CM

## 2014-05-17 DIAGNOSIS — D72828 Other elevated white blood cell count: Secondary | ICD-10-CM

## 2014-05-17 DIAGNOSIS — Z5112 Encounter for antineoplastic immunotherapy: Secondary | ICD-10-CM

## 2014-05-17 LAB — CBC WITH DIFFERENTIAL (CANCER CENTER ONLY)
BASO#: 0 10*3/uL (ref 0.0–0.2)
BASO%: 0.1 % (ref 0.0–2.0)
EOS%: 0.1 % (ref 0.0–7.0)
Eosinophils Absolute: 0 10*3/uL (ref 0.0–0.5)
HCT: 38.5 % (ref 34.8–46.6)
HGB: 12.5 g/dL (ref 11.6–15.9)
LYMPH#: 2.4 10*3/uL (ref 0.9–3.3)
LYMPH%: 13.2 % — AB (ref 14.0–48.0)
MCH: 30.8 pg (ref 26.0–34.0)
MCHC: 32.5 g/dL (ref 32.0–36.0)
MCV: 95 fL (ref 81–101)
MONO#: 0.7 10*3/uL (ref 0.1–0.9)
MONO%: 4 % (ref 0.0–13.0)
NEUT#: 14.8 10*3/uL — ABNORMAL HIGH (ref 1.5–6.5)
NEUT%: 82.6 % — ABNORMAL HIGH (ref 39.6–80.0)
RBC: 4.06 10*6/uL (ref 3.70–5.32)
RDW: 13.6 % (ref 11.1–15.7)
WBC: 18 10*3/uL — AB (ref 3.9–10.0)

## 2014-05-17 MED ORDER — ACETAMINOPHEN 325 MG PO TABS
650.0000 mg | ORAL_TABLET | Freq: Once | ORAL | Status: AC
  Administered 2014-05-17: 650 mg via ORAL

## 2014-05-17 MED ORDER — ACETAMINOPHEN 325 MG PO TABS
ORAL_TABLET | ORAL | Status: AC
Start: 2014-05-17 — End: 2014-05-17
  Filled 2014-05-17: qty 2

## 2014-05-17 MED ORDER — PEGINTERFERON ALFA-2A 180 MCG/ML ~~LOC~~ SOLN
81.0000 ug | SUBCUTANEOUS | Status: DC
  Administered 2014-05-17: 81 ug via SUBCUTANEOUS
  Filled 2014-05-17: qty 0.45

## 2014-05-17 NOTE — Patient Instructions (Signed)
Interferon Alfa-2b injection What is this medicine? INTERFERON ALFA-2b (in ter FEER on AL fa 2 b) is a man-made protein. Natural interferons are produced in the body to help the immune system fight viral infections and certain cancer growths. This medicine has similar actions to natural interferons and is used to treat AIDS-related Kaposi's sarcoma, certain types of hepatitis or certain cancers. This medicine may also be used to treat genital or perianal warts. This medicine may be used for other purposes; ask your health care provider or pharmacist if you have questions. COMMON BRAND NAME(S): Intron A, Intron A Multidose Pen What should I tell my health care provider before I take this medicine? They need to know if you have any of these conditions: -autoimmune disease -blood or bleeding disorders -bone marrow disease -depression or other mental disorders -diabetes -heart or lung disease -kidney or liver disease -thyroid disease -an unusual or allergic reaction to interferons, E. coli protein, other medicines, foods, dyes, or preservatives -pregnant or trying to get pregnant -breast-feeding How should I use this medicine? This medicine is for injection into a muscle or under the skin or for infusion into a vein. It is usually given by a health care professional in a hospital or clinic setting. If you get this medicine at home, you will be taught how to prepare and give this medicine. Use exactly as directed. You can inject your dose at bedtime if you experience flu-like effects. Take your medicine at regular intervals. Do not take your medicine more often than directed. It is important that you put your used needles and syringes in a special sharps container. Do not put them in a trash can. If you do not have a sharps container, call your pharmacist or healthcare provider to get one. A special MedGuide will be given to you by the pharmacist with each prescription and refill. Be sure to read  this information carefully each time. Talk to your pediatrician regarding the use of this medicine in children. While this medicine may be prescribed for children as young as 1 year of age for selected conditions, precautions do apply. Overdosage: If you think you have taken too much of this medicine contact a poison control center or emergency room at once. NOTE: This medicine is only for you. Do not share this medicine with others. What if I miss a dose? If you miss a dose, take it as soon as you can. If it is almost time for your next dose, take only that dose. Do not take double or extra doses. What may interact with this medicine? -theophylline -zidovudine, AZT This list may not describe all possible interactions. Give your health care provider a list of all the medicines, herbs, non-prescription drugs, or dietary supplements you use. Also tell them if you smoke, drink alcohol, or use illegal drugs. Some items may interact with your medicine. What should I watch for while using this medicine? Visit your doctor or health care professional for regular checks on your progress. You will need regular blood checks. Do not change brands of this medicine without consulting your doctor or health care professional. Different brands of this medicine can act differently in your body. Check with your pharmacist if your refills do not look like your original product. You may get drowsy or dizzy. Do not drive, use machinery, or do anything that needs mental alertness until you know how this medicine affects you. Do not stand or sit up quickly, especially if you are an older patient.  This reduces the risk of dizzy or fainting spells. Alcohol can make you more drowsy or dizzy, increase confusion and lightheadedness. Avoid alcoholic drinks. This medicine can cause flu-like symptoms and make you feel generally unwell. If you get a fever or sore throat that do not go away after the first few weeks of treatment, do  not treat yourself. Report any of the above side effects, but continue your course of medicine even though you feel ill, unless your doctor or health care professional tells you to stop. This medicine may decrease your body's ability to fight certain types of infections. This may be more of a concern if you are receiving high doses or other chemotherapy agents. Other signs of infection include lower back or side pain, pain or difficulty passing urine. This medicine can cause blood problems or increase your risk to bruise or bleed. Call your doctor or health care professional if you notice any unusual bleeding. Be careful not to cut, bruise, or injure yourself because you may get an infection and bleed more than usual. What side effects may I notice from receiving this medicine? Side effects that you should report to your doctor or health care professional as soon as possible: -allergic reactions like skin rash, itching or hives, swelling of the face, lips, or tongue -black, tarry stools -blood in the urine -bruising or pinpoint red spots on the skin -changes in vision -confusion -depression -difficulty breathing -difficulty sleeping -difficulty thinking or concentrating -fainting spells, lightheadedness -irregular heartbeat, palpitations or chest pain -nervousness -numbness or tingling in the fingers and toes -signs of infection like fever or chills, cough, sore throat, pain or difficulty passing urine -unusually weak or tired Side effects that usually do not require medical attention (report to your doctor or health care professional if they continue or are bothersome): -changes in taste -cough -diarrhea -dry mouth -hair loss -headaches -loss of appetite -nausea, vomiting This list may not describe all possible side effects. Call your doctor for medical advice about side effects. You may report side effects to FDA at 1-800-FDA-1088. Where should I keep my medicine? Keep out of the  reach of children. Store in a refrigerator between 2 and 8 degrees C (36 and 46 degrees F). If you are using the powder, it should be used immediately after mixing or may be stored in the refrigerator for up to 24 hours. Do not freeze. Throw away any unused vials or syringes after the expiration date. NOTE: This sheet is a summary. It may not cover all possible information. If you have questions about this medicine, talk to your doctor, pharmacist, or health care provider.  2015, Elsevier/Gold Standard. (2007-07-25 16:11:19)

## 2014-05-23 ENCOUNTER — Other Ambulatory Visit: Payer: Self-pay | Admitting: *Deleted

## 2014-05-23 DIAGNOSIS — D72828 Other elevated white blood cell count: Secondary | ICD-10-CM

## 2014-05-24 ENCOUNTER — Ambulatory Visit (HOSPITAL_BASED_OUTPATIENT_CLINIC_OR_DEPARTMENT_OTHER): Payer: BLUE CROSS/BLUE SHIELD | Admitting: Hematology & Oncology

## 2014-05-24 ENCOUNTER — Ambulatory Visit (HOSPITAL_BASED_OUTPATIENT_CLINIC_OR_DEPARTMENT_OTHER): Payer: BLUE CROSS/BLUE SHIELD

## 2014-05-24 ENCOUNTER — Inpatient Hospital Stay: Payer: BLUE CROSS/BLUE SHIELD

## 2014-05-24 ENCOUNTER — Other Ambulatory Visit: Payer: BLUE CROSS/BLUE SHIELD | Admitting: Lab

## 2014-05-24 ENCOUNTER — Other Ambulatory Visit (HOSPITAL_BASED_OUTPATIENT_CLINIC_OR_DEPARTMENT_OTHER): Payer: BLUE CROSS/BLUE SHIELD | Admitting: Lab

## 2014-05-24 DIAGNOSIS — D471 Chronic myeloproliferative disease: Secondary | ICD-10-CM

## 2014-05-24 DIAGNOSIS — D72828 Other elevated white blood cell count: Secondary | ICD-10-CM | POA: Diagnosis not present

## 2014-05-24 LAB — CBC WITH DIFFERENTIAL (CANCER CENTER ONLY)
BASO#: 0 10*3/uL (ref 0.0–0.2)
BASO%: 0 % (ref 0.0–2.0)
EOS ABS: 0 10*3/uL (ref 0.0–0.5)
EOS%: 0.1 % (ref 0.0–7.0)
HCT: 37.8 % (ref 34.8–46.6)
HGB: 12.4 g/dL (ref 11.6–15.9)
LYMPH#: 2.2 10*3/uL (ref 0.9–3.3)
LYMPH%: 10 % — AB (ref 14.0–48.0)
MCH: 31.2 pg (ref 26.0–34.0)
MCHC: 32.8 g/dL (ref 32.0–36.0)
MCV: 95 fL (ref 81–101)
MONO#: 0.9 10*3/uL (ref 0.1–0.9)
MONO%: 4 % (ref 0.0–13.0)
NEUT#: 18.9 10*3/uL — ABNORMAL HIGH (ref 1.5–6.5)
NEUT%: 85.9 % — ABNORMAL HIGH (ref 39.6–80.0)
Platelets: 197 10*3/uL (ref 145–400)
RBC: 3.98 10*6/uL (ref 3.70–5.32)
RDW: 13.6 % (ref 11.1–15.7)
WBC: 22.1 10*3/uL — ABNORMAL HIGH (ref 3.9–10.0)

## 2014-05-24 LAB — CMP (CANCER CENTER ONLY)
ALBUMIN: 4 g/dL (ref 3.3–5.5)
ALT(SGPT): 16 U/L (ref 10–47)
AST: 25 U/L (ref 11–38)
Alkaline Phosphatase: 140 U/L — ABNORMAL HIGH (ref 26–84)
BUN: 9 mg/dL (ref 7–22)
CALCIUM: 10.5 mg/dL — AB (ref 8.0–10.3)
CHLORIDE: 101 meq/L (ref 98–108)
CO2: 30 meq/L (ref 18–33)
Creat: 0.8 mg/dl (ref 0.6–1.2)
GLUCOSE: 116 mg/dL (ref 73–118)
Potassium: 3.5 mEq/L (ref 3.3–4.7)
SODIUM: 144 meq/L (ref 128–145)
Total Bilirubin: 0.8 mg/dl (ref 0.20–1.60)
Total Protein: 8.1 g/dL (ref 6.4–8.1)

## 2014-05-24 LAB — LACTATE DEHYDROGENASE: LDH: 138 U/L (ref 94–250)

## 2014-05-24 MED ORDER — ACETAMINOPHEN 325 MG PO TABS
ORAL_TABLET | ORAL | Status: AC
  Filled 2014-05-24: qty 2

## 2014-05-24 MED ORDER — PEGINTERFERON ALFA-2A 180 MCG/ML ~~LOC~~ SOLN
81.0000 ug | SUBCUTANEOUS | Status: DC
  Administered 2014-05-24: 81 ug via SUBCUTANEOUS
  Filled 2014-05-24 (×2): qty 0.45

## 2014-05-24 MED ORDER — ACETAMINOPHEN 325 MG PO TABS
650.0000 mg | ORAL_TABLET | Freq: Once | ORAL | Status: AC
  Administered 2014-05-24: 650 mg via ORAL

## 2014-05-24 NOTE — Progress Notes (Signed)
Hematology and Oncology Follow Up Visit  Brianna Fletcher [id number] 15-Jul-1970 44 y.o. 05/24/2014   Principle Diagnosis:  Chronic myeloproliferative syndrome-Triple negative  Current Therapy:   Peg-Interferon q weekly     Interim History:  Brianna Fletcher is back for followup. She is tolerating the peg-interferon well. She really looks good. [name] working.   She still has some itching. This seems to come and go.  [name] had no obvious rashes.  Her skin is a little bit on the dry side.  [name] had no issues with her bowels or bladder.  There is no cough. There's no shortness of breath. She has no nausea or vomiting.  She does have some fatigue on occasion.  There's been no bleeding.  Her appetite has been pretty good.   Overall, her performance status is ECOG 1. Medications:  Current outpatient prescriptions:  .  doxepin (SINEQUAN) 50 MG capsule, TAKE 1 CAPSULE BY MOUTH AT BEDTIME, Disp: 30 capsule, Rfl: 3 No current facility-administered medications for this visit.  Facility-Administered Medications Ordered in Other Visits:  .  acetaminophen (TYLENOL) tablet 650 mg, 650 mg, Oral, Once, [name], MD, 650 mg at 04/26/14 1516 .  peginterferon alfa-2a (PEGASYS) injection 81 mcg, 81 mcg, Subcutaneous, Weekly, [name], MD, 81 mcg at 04/26/14 1530 .  peginterferon alfa-2a (PEGASYS) injection 81 mcg, 81 mcg, Subcutaneous, Weekly, [name], MD, 81 mcg at 05/24/14 1550  Allergies: No Known Allergies  Past Medical History, Surgical history, Social history, and Family History were reviewed and updated.  Review of Systems: As above  Physical Exam:  vitals were not taken for this visit.  Well-developed and well-nourished white female. Head and neck exam shows no ocular or oral lesions. She has no palpable cervical or supraclavicular lymph nodes. Lungs are clear. Cardiac exam regular rate and rhythm with no murmurs rubs or bruits. Abdomen is soft. She has good  bowel sounds. There is no fluid wave. There is no palpable liver or spleen tip. Back exam shows no tenderness over the spine or ribs. There is some tenderness over the right hip area. This is chronic. She has some slight decreased range of motion of the right hip. Extremities shows no clubbing, cyanosis or edema. Skin exam no rashes, ecchymosis or petechia. Her skin is a little dry. Neurological exam shows no focal neurological deficits.  Lab Results  Component Value Date   WBC 22.1* 05/24/2014   HGB 12.4 05/24/2014   HCT 37.8 05/24/2014   MCV 95 05/24/2014   PLT 197 05/24/2014     Chemistry      Component Value Date/Time   NA 144 05/24/2014 1446   NA 139 05/03/2014 1350   K 3.5 05/24/2014 1446   K 3.5 05/03/2014 1350   CL 101 05/24/2014 1446   CL 104 05/03/2014 1350   CO2 30 05/24/2014 1446   CO2 23 05/03/2014 1350   BUN 9 05/24/2014 1446   BUN 9 05/03/2014 1350   CREATININE 0.8 05/24/2014 1446   CREATININE 0.68 05/03/2014 1350      Component Value Date/Time   CALCIUM 10.5* 05/24/2014 1446   CALCIUM 10.2 05/03/2014 1350   ALKPHOS 140* 05/24/2014 1446   ALKPHOS 153* 05/03/2014 1350   AST 25 05/24/2014 1446   AST 22 05/03/2014 1350   ALT 16 05/24/2014 1446   ALT 16 05/03/2014 1350   BILITOT 0.80 05/24/2014 1446   BILITOT 0.5 05/03/2014 1350         Impression and  Plan: Brianna Fletcher is 44 year old female. She has a myeloproliferative syndrome. So far, our workup has been totally negative.  Her white cell count is going up a little bit. I still do not think we have to make any changes with the interferon.  Symptomatically, she's been doing pretty well.  I will continue her on the weekly interferon for now.  I do want to check her thyroid function. We last checked it a little over a year ago. I noted that interferon can cause some hypothyroidism. She is not gain any weight yet, I think that she may have some degree of hypothyroidism.  She will continue the weekly  interferon. I spent about 30 minutes with her.   [name], MD 3/4/20164:40 PM

## 2014-05-24 NOTE — Patient Instructions (Signed)
Interferon Beta-1a injection What is this medicine? INTERFERON BETA-1a (in ter FEER on BAY ta wun aye) helps to decrease the number of multiple sclerosis attacks and to slow physical disability in people with relapsing forms of the disease. The medicine does not cure multiple sclerosis. This medicine may be used for other purposes; ask your health care provider or pharmacist if you have questions. COMMON BRAND NAME(S): Avonex, Rebif, Rebif Rebidose What should I tell my health care provider before I take this medicine? They need to know if you have any of these conditions: -depression -drink more than 3 alcohol containing drinks per day -heart disease or irregular heart beats/rhythm -immune system problems -liver disease -seizures -thyroid disease -an unusual or allergic reaction to interferon, hamster proteins, albumin, mannitol, or other medicines, foods, dyes, or preservatives -pregnant or trying to get pregnant -breast-feeding How should I use this medicine? Rebif is for injection under the skin. Avonex is for injection into a muscle. You will be taught how to prepare and give this medicine. Use exactly as directed. Take your medicine at regular intervals. Do not take it more often than directed. It is important that you put your used needles and syringes in a special sharps container. Do not put them in a trash can. If you do not have a sharps container, call your pharmacist or healthcare provider to get one. A special MedGuide will be given to you by the pharmacist with each prescription and refill. Be sure to read this information carefully each time. Talk to your pediatrician regarding the use of this medicine in children. Special care may be needed. The manufacturer of the medicine offers free information to patients and their health care partners. Call 782-375-2949 for more information about Rebif. Call 418-767-7518 for more information about Avonex. Overdosage: If you think you  have taken too much of this medicine contact a poison control center or emergency room at once. NOTE: This medicine is only for you. Do not share this medicine with others. What if I miss a dose? If you miss a dose, take it as soon as you can. If it is almost time for your next dose, take only that dose. Do not take double or extra doses. Do not give 2 injections within 2 days of each other. If you accidentally take a dose on 2 consecutive days, call your doctor or health care professional. What may interact with this medicine? -zidovudine, AZT This list may not describe all possible interactions. Give your health care provider a list of all the medicines, herbs, non-prescription drugs, or dietary supplements you use. Also tell them if you smoke, drink alcohol, or use illegal drugs. Some items may interact with your medicine. What should I watch for while using this medicine? Tell your doctor or healthcare professional if your symptoms do not start to get better or if they get worse. Visit your doctor or health care professional for regular checks on your progress. You will need frequent blood checks. Flu-like symptoms are common with the medicine. Using this medicine at night may help. Ask your doctor or health care professional about taking acetaminophen or ibuprofen before your dose and for 24 hours after you receive your injection. Do not become pregnant while taking this medicine. Women should inform their doctor if they wish to become pregnant or think they might be pregnant. There is a potential for serious side effects to an unborn child. Talk to your health care professional or pharmacist for more information. What side effects  may I notice from receiving this medicine? Side effects that you should report to your doctor or health care professional as soon as possible: -a skin sore with a black-blue color, swelling, or drainage -allergic reactions like skin rash, itching or hives, swelling of  the face, lips, or tongue -breathing problems -depression -seizures -severe stomach pain -signs and symptoms of infection like fever or chills,cough, sore throat, pain or difficulty passing urine -swelling of the ankles -unusual bleeding or bruising -unusually weak or tired -yellowing of the eyes or skin Side effects that usually do not require medical attention (report to your doctor or health care professional if they continue or are bothersome): -dizziness -headache -menstrual changes -muscle aches -nausea -pain, redness, or irritation at site where injected -tiredness This list may not describe all possible side effects. Call your doctor for medical advice about side effects. You may report side effects to FDA at 1-800-FDA-1088. Where should I keep my medicine? Keep out of the reach of children. Rebif: Store in a refrigerator between 2 and 8 degrees C (36 and 46 degrees F). Do not freeze. If a refrigerator is not available, the medicine can be kept cool at or below 25 degrees C (77 degrees F) for up to 30 days. Protect from light. Throw away any unused medicine after the expiration date. Avonex Vials: Store in a refrigerator between 2 and 8 degrees C (36 and 46 degrees F). Do not freeze. If a refrigerator is not available, the vials may be kept at room temperature at or below 25 degrees C (77 degrees F) for up to 30 days. Protect from light. Throw away any unused solution. Avonex Pre-filled syringes: Store in a refrigerator between 2 and 8 degrees C (36 and 46 degrees F). Do not freeze. If a refrigerator is not available, the pre-filled syringe can be stored at a temperature at or below 25 degrees C (77 degrees F) for up to 7 days. Protect from light. Avonex Auto-Injector Pens: Store in a refrigerator between 2 and 8 degrees C (36 and 46 degrees F). Do not freeze. If a refrigerator is not available, the auto-injector pen may be stored at a temperature at or below 25 degrees C (77 degrees  F) for up to 7 days. Protect from light. NOTE: This sheet is a summary. It may not cover all possible information. If you have questions about this medicine, talk to your doctor, pharmacist, or health care provider.  2015, Elsevier/Gold Standard. (2013-06-27 10:50:38)

## 2014-05-31 ENCOUNTER — Inpatient Hospital Stay: Payer: BLUE CROSS/BLUE SHIELD

## 2014-05-31 ENCOUNTER — Other Ambulatory Visit: Payer: BLUE CROSS/BLUE SHIELD | Admitting: Lab

## 2014-05-31 ENCOUNTER — Telehealth: Payer: Self-pay | Admitting: Hematology & Oncology

## 2014-05-31 ENCOUNTER — Other Ambulatory Visit (HOSPITAL_BASED_OUTPATIENT_CLINIC_OR_DEPARTMENT_OTHER): Payer: BLUE CROSS/BLUE SHIELD | Admitting: Lab

## 2014-05-31 ENCOUNTER — Ambulatory Visit: Payer: BLUE CROSS/BLUE SHIELD | Admitting: Hematology & Oncology

## 2014-05-31 ENCOUNTER — Ambulatory Visit (HOSPITAL_BASED_OUTPATIENT_CLINIC_OR_DEPARTMENT_OTHER): Payer: BLUE CROSS/BLUE SHIELD

## 2014-05-31 DIAGNOSIS — D471 Chronic myeloproliferative disease: Secondary | ICD-10-CM

## 2014-05-31 LAB — CBC WITH DIFFERENTIAL (CANCER CENTER ONLY)
BASO#: 0 10*3/uL (ref 0.0–0.2)
BASO%: 0.1 % (ref 0.0–2.0)
EOS%: 0.3 % (ref 0.0–7.0)
Eosinophils Absolute: 0.1 10*3/uL (ref 0.0–0.5)
HCT: 35.5 % (ref 34.8–46.6)
HGB: 11.6 g/dL (ref 11.6–15.9)
LYMPH#: 2.3 10*3/uL (ref 0.9–3.3)
LYMPH%: 13.2 % — AB (ref 14.0–48.0)
MCH: 30.9 pg (ref 26.0–34.0)
MCHC: 32.7 g/dL (ref 32.0–36.0)
MCV: 95 fL (ref 81–101)
MONO#: 0.7 10*3/uL (ref 0.1–0.9)
MONO%: 3.8 % (ref 0.0–13.0)
NEUT%: 82.6 % — ABNORMAL HIGH (ref 39.6–80.0)
NEUTROS ABS: 14.2 10*3/uL — AB (ref 1.5–6.5)
PLATELETS: 226 10*3/uL (ref 145–400)
RBC: 3.75 10*6/uL (ref 3.70–5.32)
RDW: 13.6 % (ref 11.1–15.7)
WBC: 17.2 10*3/uL — ABNORMAL HIGH (ref 3.9–10.0)

## 2014-05-31 MED ORDER — ACETAMINOPHEN 325 MG PO TABS
650.0000 mg | ORAL_TABLET | Freq: Once | ORAL | Status: AC
  Administered 2014-05-31: 650 mg via ORAL

## 2014-05-31 MED ORDER — ACETAMINOPHEN 325 MG PO TABS
ORAL_TABLET | ORAL | Status: AC
  Filled 2014-05-31: qty 2

## 2014-05-31 MED ORDER — PEGINTERFERON ALFA-2A 180 MCG/ML ~~LOC~~ SOLN
81.0000 ug | SUBCUTANEOUS | Status: DC
  Administered 2014-05-31: 81 ug via SUBCUTANEOUS
  Filled 2014-05-31: qty 0.45

## 2014-05-31 NOTE — Telephone Encounter (Signed)
[employer] [place] Myeloproliferative disorder - Primary 238.79 K9326  - [place] PEG INTERFERON ALFA-2A/180  Efc: 03/22/2012  I spoke w Naoma Diener on 05/31/2014 and she advised, this injection is covered under pts plan and npa is required.  However, pt does have oop she has not meet as of to date.   Ref: [id number]  P: [phone]

## 2014-05-31 NOTE — Patient Instructions (Signed)
[facility] Discharge Instructions for Patients Receiving Chemotherapy  Today you received the following chemotherapy agents Pegasys.  To help prevent nausea and vomiting after your treatment, we encourage you to take your nausea medication.   If you develop nausea and vomiting that is not controlled by your nausea medication, call the clinic.   BELOW ARE SYMPTOMS THAT SHOULD BE REPORTED IMMEDIATELY:  *FEVER GREATER THAN 100.5 F  *CHILLS WITH OR WITHOUT FEVER  NAUSEA AND VOMITING THAT IS NOT CONTROLLED WITH YOUR NAUSEA MEDICATION  *UNUSUAL SHORTNESS OF BREATH  *UNUSUAL BRUISING OR BLEEDING  TENDERNESS IN MOUTH AND THROAT WITH OR WITHOUT PRESENCE OF ULCERS  *URINARY PROBLEMS  *BOWEL PROBLEMS  UNUSUAL RASH Items with * indicate a potential emergency and should be followed up as soon as possible.  Feel free to call the clinic you have any questions or concerns. The clinic phone number is [phone].

## 2014-06-01 LAB — COMPREHENSIVE METABOLIC PANEL
ALBUMIN: 4.2 g/dL (ref 3.5–5.2)
ALT: 10 U/L (ref 0–35)
AST: 13 U/L (ref 0–37)
Alkaline Phosphatase: 141 U/L — ABNORMAL HIGH (ref 39–117)
BUN: 7 mg/dL (ref 6–23)
CALCIUM: 10.1 mg/dL (ref 8.4–10.5)
CHLORIDE: 102 meq/L (ref 96–112)
CO2: 24 meq/L (ref 19–32)
Creatinine, Ser: 0.75 mg/dL (ref 0.50–1.10)
Glucose, Bld: 142 mg/dL — ABNORMAL HIGH (ref 70–99)
POTASSIUM: 3.6 meq/L (ref 3.5–5.3)
Sodium: 137 mEq/L (ref 135–145)
Total Bilirubin: 0.5 mg/dL (ref 0.2–1.2)
Total Protein: 6.9 g/dL (ref 6.0–8.3)

## 2014-06-01 LAB — VITAMIN D 25 HYDROXY (VIT D DEFICIENCY, FRACTURES): Vit D, 25-Hydroxy: 24 ng/mL — ABNORMAL LOW (ref 30–100)

## 2014-06-03 ENCOUNTER — Telehealth: Payer: Self-pay | Admitting: *Deleted

## 2014-06-03 LAB — TSH CHCC: TSH: 1.116 m(IU)/L (ref 0.308–3.960)

## 2014-06-03 NOTE — Telephone Encounter (Signed)
Called patient about her low vitamin d level.  Patient started on Vitamin D 2000 u this past Friday.  Patient encouraged to take this every day.

## 2014-06-03 NOTE — Telephone Encounter (Signed)
-----   Message from [name], MD sent at 06/03/2014  6:38 AM EDT ----- Call - vit D is low.  How much is she taking??  Brianna Fletcher

## 2014-06-06 ENCOUNTER — Other Ambulatory Visit: Payer: Self-pay | Admitting: Nurse Practitioner

## 2014-06-06 DIAGNOSIS — D471 Chronic myeloproliferative disease: Secondary | ICD-10-CM

## 2014-06-07 ENCOUNTER — Inpatient Hospital Stay: Payer: BLUE CROSS/BLUE SHIELD

## 2014-06-07 ENCOUNTER — Other Ambulatory Visit: Payer: BLUE CROSS/BLUE SHIELD | Admitting: Lab

## 2014-06-13 ENCOUNTER — Telehealth: Payer: Self-pay | Admitting: Hematology & Oncology

## 2014-06-13 NOTE — Telephone Encounter (Signed)
[employer] [place]  has [place] 180MCG/ML  and is good until 06/12/2014 - 06/12/2015.   To be administered at home   P: [phone] F: [phone]      COPY SCANNED

## 2014-06-13 NOTE — Telephone Encounter (Signed)
Case: [id number] Auth: 06/12/2014 - 06/12/2015 APPROVED: Sheliah Hatch

## 2014-06-14 ENCOUNTER — Ambulatory Visit (HOSPITAL_BASED_OUTPATIENT_CLINIC_OR_DEPARTMENT_OTHER): Payer: BLUE CROSS/BLUE SHIELD | Admitting: Hematology & Oncology

## 2014-06-14 ENCOUNTER — Other Ambulatory Visit (HOSPITAL_BASED_OUTPATIENT_CLINIC_OR_DEPARTMENT_OTHER): Payer: BLUE CROSS/BLUE SHIELD

## 2014-06-14 ENCOUNTER — Encounter: Payer: Self-pay | Admitting: Hematology & Oncology

## 2014-06-14 ENCOUNTER — Inpatient Hospital Stay: Payer: BLUE CROSS/BLUE SHIELD

## 2014-06-14 VITALS — BP 148/74 | HR 83 | Temp 97.6°F | Resp 14 | Ht 65.0 in | Wt 228.0 lb

## 2014-06-14 DIAGNOSIS — D471 Chronic myeloproliferative disease: Secondary | ICD-10-CM

## 2014-06-14 DIAGNOSIS — M810 Age-related osteoporosis without current pathological fracture: Secondary | ICD-10-CM

## 2014-06-14 LAB — CMP (CANCER CENTER ONLY)
ALT(SGPT): 14 U/L (ref 10–47)
AST: 20 U/L (ref 11–38)
Albumin: 3.7 g/dL (ref 3.3–5.5)
Alkaline Phosphatase: 142 U/L — ABNORMAL HIGH (ref 26–84)
BILIRUBIN TOTAL: 0.8 mg/dL (ref 0.20–1.60)
BUN: 9 mg/dL (ref 7–22)
CO2: 28 meq/L (ref 18–33)
CREATININE: 0.7 mg/dL (ref 0.6–1.2)
Calcium: 10.2 mg/dL (ref 8.0–10.3)
Chloride: 101 mEq/L (ref 98–108)
Glucose, Bld: 158 mg/dL — ABNORMAL HIGH (ref 73–118)
Potassium: 4 mEq/L (ref 3.3–4.7)
Sodium: 139 mEq/L (ref 128–145)
Total Protein: 7.7 g/dL (ref 6.4–8.1)

## 2014-06-14 LAB — CBC WITH DIFFERENTIAL (CANCER CENTER ONLY)
BASO#: 0 10*3/uL (ref 0.0–0.2)
BASO%: 0 % (ref 0.0–2.0)
EOS%: 0.3 % (ref 0.0–7.0)
Eosinophils Absolute: 0.1 10*3/uL (ref 0.0–0.5)
HEMATOCRIT: 37 % (ref 34.8–46.6)
HGB: 12.1 g/dL (ref 11.6–15.9)
LYMPH#: 2.5 10*3/uL (ref 0.9–3.3)
LYMPH%: 11.4 % — AB (ref 14.0–48.0)
MCH: 31.4 pg (ref 26.0–34.0)
MCHC: 32.7 g/dL (ref 32.0–36.0)
MCV: 96 fL (ref 81–101)
MONO#: 0.9 10*3/uL (ref 0.1–0.9)
MONO%: 4.1 % (ref 0.0–13.0)
NEUT#: 18.3 10*3/uL — ABNORMAL HIGH (ref 1.5–6.5)
NEUT%: 84.2 % — AB (ref 39.6–80.0)
PLATELETS: 255 10*3/uL (ref 145–400)
RBC: 3.85 10*6/uL (ref 3.70–5.32)
RDW: 14.9 % (ref 11.1–15.7)
WBC: 21.7 10*3/uL — ABNORMAL HIGH (ref 3.9–10.0)

## 2014-06-14 NOTE — Progress Notes (Signed)
Hematology and Oncology Follow Up Visit  Brianna Fletcher [id number] 13-Jan-1971 44 y.o. 06/14/2014   Principle Diagnosis:  Chronic myeloproliferative syndrome-Triple negative  Current Therapy:   Peg-Interferon q weekly     Interim History:  [name] is back for followup. She is tolerating the peg-interferon well. She really looks good. [name] working. Today is her son's 48th birthday. He is on [place]. She wishes that she could be out there with him.  We have had issues with the insurance company now. They are denying her treatments because she had them done here. It's been a year or so that she's been on these treatments. I discussed finding very interesting that there are now denying her treatments.  She really does not complain much in way of pruritus. This uses a good sign that things are working.  We'll last saw her, her vitamin D level was on the low side. She now is taking vitamin D. I probably will not repeat the level for another couple months.  [name] had no obvious rashes.  Her skin is a little bit on the dry side.  [name] had no issues with her bowels or bladder.  There is no cough. There's no shortness of breath. She has no nausea or vomiting.  She does have some fatigue on occasion.  There's been no bleeding.  Her appetite has been pretty good.  Overall, her performance status is ECOG 1.   Medications:  Current outpatient prescriptions:  .  aspirin 81 MG tablet, Take 81 mg by mouth daily., Disp: , Rfl:  .  cholecalciferol (VITAMIN D) 1000 UNITS tablet, Take 2,000 Units by mouth daily., Disp: , Rfl:  .  doxepin (SINEQUAN) 50 MG capsule, TAKE 1 CAPSULE BY MOUTH AT BEDTIME, Disp: 30 capsule, Rfl: 3 .  Melatonin 5 MG TABS, Take by mouth as needed., Disp: , Rfl:  .  Omega-3 Fatty Acids (FISH OIL) 1000 MG CAPS, Take by mouth every morning., Disp: , Rfl:  No current facility-administered medications for this visit.  Facility-Administered Medications Ordered in  Other Visits:  .  acetaminophen (TYLENOL) tablet 650 mg, 650 mg, Oral, Once, [name], MD, 650 mg at 04/26/14 1516 .  peginterferon alfa-2a (PEGASYS) injection 81 mcg, 81 mcg, Subcutaneous, Weekly, [name], MD, 81 mcg at 04/26/14 1530  Allergies: No Known Allergies  Past Medical History, Surgical history, Social history, and Family History were reviewed and updated.  Review of Systems: As above  Physical Exam:  height is 5\' 5"  (1.651 m) and weight is 228 lb (103.42 kg). Her oral temperature is 97.6 F (36.4 C). Her blood pressure is 148/74 and her pulse is 83. Her respiration is 14.   Well-developed and well-nourished white female. Head and neck exam shows no ocular or oral lesions. She has no palpable cervical or supraclavicular lymph nodes. Lungs are clear. Cardiac exam regular rate and rhythm with no murmurs rubs or bruits. Abdomen is soft. She has good bowel sounds. There is no fluid wave. There is no palpable liver or spleen tip. Back exam shows no tenderness over the spine or ribs. There is some tenderness over the right hip area. This is chronic. She has some slight decreased range of motion of the right hip. Extremities shows no clubbing, cyanosis or edema. Skin exam no rashes, ecchymosis or petechia. Her skin is a little dry. Neurological exam shows no focal neurological deficits.  Lab Results  Component Value Date   WBC 21.7* 06/14/2014   HGB  12.1 06/14/2014   HCT 37.0 06/14/2014   MCV 96 06/14/2014   PLT 255 06/14/2014     Chemistry      Component Value Date/Time   NA 139 06/14/2014 1413   NA 137 05/31/2014 1343   K 4.0 06/14/2014 1413   K 3.6 05/31/2014 1343   CL 101 06/14/2014 1413   CL 102 05/31/2014 1343   CO2 28 06/14/2014 1413   CO2 24 05/31/2014 1343   BUN 9 06/14/2014 1413   BUN 7 05/31/2014 1343   CREATININE 0.7 06/14/2014 1413   CREATININE 0.75 05/31/2014 1343      Component Value Date/Time   CALCIUM 10.2 06/14/2014 1413   CALCIUM 10.1  05/31/2014 1343   ALKPHOS 142* 06/14/2014 1413   ALKPHOS 141* 05/31/2014 1343   AST 20 06/14/2014 1413   AST 13 05/31/2014 1343   ALT 14 06/14/2014 1413   ALT 10 05/31/2014 1343   BILITOT 0.80 06/14/2014 1413   BILITOT 0.5 05/31/2014 1343         Impression and Plan: Brianna Fletcher is 44 year old female. She has a myeloproliferative syndrome. So far, our workup has been totally negative.  Her white cell count tends to fluctuate. However, symptomatically, she is doing quite well. She is not itching nearly as much.  Her [employer] is not going to let her have the treatments here. However, she must have blood work done before she gets her treatments so we can monitor any changes in dosing. It is apparent that her insurance company does not understand this concept.  As such, we will have to have her get her blood work here and then she will have to give herself injections at home. Per she still wants to do weekly injections. I think this would be okay for right now.   I spent about 30 minutes with her.   [name], MD 3/25/20163:18 PM

## 2014-06-21 ENCOUNTER — Other Ambulatory Visit (HOSPITAL_BASED_OUTPATIENT_CLINIC_OR_DEPARTMENT_OTHER): Payer: BLUE CROSS/BLUE SHIELD

## 2014-06-21 ENCOUNTER — Inpatient Hospital Stay: Payer: BLUE CROSS/BLUE SHIELD

## 2014-06-21 DIAGNOSIS — D471 Chronic myeloproliferative disease: Secondary | ICD-10-CM | POA: Diagnosis not present

## 2014-06-21 DIAGNOSIS — M810 Age-related osteoporosis without current pathological fracture: Secondary | ICD-10-CM

## 2014-06-21 LAB — CBC WITH DIFFERENTIAL (CANCER CENTER ONLY)
BASO#: 0 10*3/uL (ref 0.0–0.2)
BASO%: 0 % (ref 0.0–2.0)
EOS%: 0.2 % (ref 0.0–7.0)
Eosinophils Absolute: 0.1 10*3/uL (ref 0.0–0.5)
HEMATOCRIT: 40 % (ref 34.8–46.6)
HEMOGLOBIN: 12.7 g/dL (ref 11.6–15.9)
LYMPH#: 3.1 10*3/uL (ref 0.9–3.3)
LYMPH%: 12.9 % — ABNORMAL LOW (ref 14.0–48.0)
MCH: 31.4 pg (ref 26.0–34.0)
MCHC: 31.8 g/dL — AB (ref 32.0–36.0)
MCV: 99 fL (ref 81–101)
MONO#: 1.1 10*3/uL — AB (ref 0.1–0.9)
MONO%: 4.4 % (ref 0.0–13.0)
NEUT#: 20 10*3/uL — ABNORMAL HIGH (ref 1.5–6.5)
NEUT%: 82.5 % — AB (ref 39.6–80.0)
Platelets: 287 10*3/uL (ref 145–400)
RBC: 4.05 10*6/uL (ref 3.70–5.32)
RDW: 15.2 % (ref 11.1–15.7)
WBC: 24.3 10*3/uL — AB (ref 3.9–10.0)

## 2014-06-21 LAB — COMPREHENSIVE METABOLIC PANEL
ALBUMIN: 4.6 g/dL (ref 3.5–5.2)
ALT: 16 U/L (ref 0–35)
AST: 20 U/L (ref 0–37)
Alkaline Phosphatase: 149 U/L — ABNORMAL HIGH (ref 39–117)
BUN: 16 mg/dL (ref 6–23)
CALCIUM: 10.5 mg/dL (ref 8.4–10.5)
CHLORIDE: 104 meq/L (ref 96–112)
CO2: 24 mEq/L (ref 19–32)
CREATININE: 0.68 mg/dL (ref 0.50–1.10)
Glucose, Bld: 84 mg/dL (ref 70–99)
Potassium: 4 mEq/L (ref 3.5–5.3)
Sodium: 138 mEq/L (ref 135–145)
Total Bilirubin: 0.5 mg/dL (ref 0.2–1.2)
Total Protein: 8.3 g/dL (ref 6.0–8.3)

## 2014-06-21 LAB — TECHNOLOGIST REVIEW CHCC SATELLITE

## 2014-06-22 LAB — VITAMIN D 25 HYDROXY (VIT D DEFICIENCY, FRACTURES): Vit D, 25-Hydroxy: 24 ng/mL — ABNORMAL LOW (ref 30–100)

## 2014-06-24 ENCOUNTER — Encounter: Payer: Self-pay | Admitting: *Deleted

## 2014-06-27 ENCOUNTER — Other Ambulatory Visit: Payer: Self-pay | Admitting: *Deleted

## 2014-06-27 ENCOUNTER — Ambulatory Visit: Payer: BLUE CROSS/BLUE SHIELD | Admitting: Family Medicine

## 2014-06-27 DIAGNOSIS — D72828 Other elevated white blood cell count: Secondary | ICD-10-CM

## 2014-06-28 ENCOUNTER — Encounter: Payer: Self-pay | Admitting: Nurse Practitioner

## 2014-06-28 ENCOUNTER — Encounter: Payer: Self-pay | Admitting: Family Medicine

## 2014-06-28 ENCOUNTER — Other Ambulatory Visit: Payer: BLUE CROSS/BLUE SHIELD | Admitting: Lab

## 2014-06-28 ENCOUNTER — Other Ambulatory Visit: Payer: BLUE CROSS/BLUE SHIELD

## 2014-06-28 ENCOUNTER — Inpatient Hospital Stay: Payer: BLUE CROSS/BLUE SHIELD

## 2014-06-28 ENCOUNTER — Ambulatory Visit: Payer: BLUE CROSS/BLUE SHIELD | Admitting: Family

## 2014-06-28 ENCOUNTER — Other Ambulatory Visit (HOSPITAL_BASED_OUTPATIENT_CLINIC_OR_DEPARTMENT_OTHER): Payer: BLUE CROSS/BLUE SHIELD

## 2014-06-28 ENCOUNTER — Ambulatory Visit (INDEPENDENT_AMBULATORY_CARE_PROVIDER_SITE_OTHER): Payer: BLUE CROSS/BLUE SHIELD | Admitting: Family Medicine

## 2014-06-28 VITALS — BP 130/80 | HR 76 | Temp 97.7°F | Resp 18 | Ht 67.0 in | Wt 240.0 lb

## 2014-06-28 DIAGNOSIS — D471 Chronic myeloproliferative disease: Secondary | ICD-10-CM

## 2014-06-28 DIAGNOSIS — B369 Superficial mycosis, unspecified: Secondary | ICD-10-CM

## 2014-06-28 DIAGNOSIS — D72828 Other elevated white blood cell count: Secondary | ICD-10-CM

## 2014-06-28 LAB — CBC WITH DIFFERENTIAL (CANCER CENTER ONLY)
BASO#: 0 10*3/uL (ref 0.0–0.2)
BASO%: 0.1 % (ref 0.0–2.0)
EOS%: 0.2 % (ref 0.0–7.0)
Eosinophils Absolute: 0 10*3/uL (ref 0.0–0.5)
HEMATOCRIT: 39.4 % (ref 34.8–46.6)
HEMOGLOBIN: 12.8 g/dL (ref 11.6–15.9)
LYMPH#: 2.4 10*3/uL (ref 0.9–3.3)
LYMPH%: 14.6 % (ref 14.0–48.0)
MCH: 31.7 pg (ref 26.0–34.0)
MCHC: 32.5 g/dL (ref 32.0–36.0)
MCV: 98 fL (ref 81–101)
MONO#: 0.7 10*3/uL (ref 0.1–0.9)
MONO%: 4.5 % (ref 0.0–13.0)
NEUT#: 13.3 10*3/uL — ABNORMAL HIGH (ref 1.5–6.5)
NEUT%: 80.6 % — AB (ref 39.6–80.0)
Platelets: 229 10*3/uL (ref 145–400)
RBC: 4.04 10*6/uL (ref 3.70–5.32)
RDW: 14.8 % (ref 11.1–15.7)
WBC: 16.5 10*3/uL — ABNORMAL HIGH (ref 3.9–10.0)

## 2014-06-28 LAB — CMP (CANCER CENTER ONLY)
ALT(SGPT): 15 U/L (ref 10–47)
AST: 19 U/L (ref 11–38)
Albumin: 3.7 g/dL (ref 3.3–5.5)
Alkaline Phosphatase: 111 U/L — ABNORMAL HIGH (ref 26–84)
BUN: 11 mg/dL (ref 7–22)
CALCIUM: 9.9 mg/dL (ref 8.0–10.3)
CO2: 26 meq/L (ref 18–33)
Chloride: 104 mEq/L (ref 98–108)
Creat: 0.6 mg/dl (ref 0.6–1.2)
Glucose, Bld: 91 mg/dL (ref 73–118)
POTASSIUM: 3.7 meq/L (ref 3.3–4.7)
SODIUM: 138 meq/L (ref 128–145)
TOTAL PROTEIN: 7.3 g/dL (ref 6.4–8.1)
Total Bilirubin: 0.6 mg/dl (ref 0.20–1.60)

## 2014-06-28 MED ORDER — CLOTRIMAZOLE-BETAMETHASONE 1-0.05 % EX CREA: 1.0000 "application " | TOPICAL_CREAM | Freq: Two times a day (BID) | CUTANEOUS | Status: DC

## 2014-06-28 NOTE — Progress Notes (Signed)
Patient ID: Brianna Fletcher, female    DOB: 06-12-1970  Age: 44 y.o. MRN: [id number]    Subjective:  Subjective HPI Brianna Fletcher presents to establish and c/o itching on her bottom in "crack".  No other symptoms.  Review of Systems  History Past Medical History  Diagnosis Date  . Glaucoma   . Arthritis   . Leukocytosis, unspecified 11/15/2012  . Chronic neutrophilia 11/15/2012  . Pruritus 11/15/2012  . Monocytosis 11/15/2012  . Myeloproliferative disorder 03/08/2013    She has past surgical history that includes Tubal ligation (2000) and Eye surgery.   Her family history is not on file.She reports that she has never smoked. She has never used smokeless tobacco. She reports that she does not drink alcohol. Her drug history is not on file.  Current Outpatient Prescriptions on File Prior to Visit  Medication Sig Dispense Refill  . aspirin 81 MG tablet Take 81 mg by mouth daily.    . cholecalciferol (VITAMIN D) 1000 UNITS tablet Take 2,000 Units by mouth daily.    Marland Kitchen doxepin (SINEQUAN) 50 MG capsule TAKE 1 CAPSULE BY MOUTH AT BEDTIME 30 capsule 3  . Melatonin 5 MG TABS Take by mouth as needed.    . Omega-3 Fatty Acids (FISH OIL) 1000 MG CAPS Take by mouth every morning.     Current Facility-Administered Medications on File Prior to Visit  Medication Dose Route Frequency Provider Last Rate Last Dose  . acetaminophen (TYLENOL) tablet 650 mg  650 mg Oral Once [name], MD   650 mg at 04/26/14 1516  . peginterferon alfa-2a (PEGASYS) injection 81 mcg  81 mcg Subcutaneous Weekly [name], MD   81 mcg at 04/26/14 1530     Objective:  Objective Physical Exam BP 130/80 mmHg  Pulse 76  Temp(Src) 97.7 F (36.5 C) (Oral)  Resp 18  Ht 5\' 7"  (1.702 m)  Wt 240 lb (108.863 kg)  BMI 37.58 kg/m2  SpO2 99%  LMP 06/21/2014 Wt Readings from Last 3 Encounters:  06/28/14 240 lb (108.863 kg)  06/14/14 228 lb (103.42 kg)  05/24/14 226 lb (102.513 kg)     Lab Results    Component Value Date   WBC 16.5* 06/28/2014   HGB 12.8 06/28/2014   HCT 39.4 06/28/2014   PLT 229 06/28/2014   GLUCOSE 91 06/28/2014   ALT 15 06/28/2014   AST 19 06/28/2014   NA 138 06/28/2014   K 3.7 06/28/2014   CL 104 06/28/2014   CREATININE 0.6 06/28/2014   BUN 11 06/28/2014   CO2 26 06/28/2014   TSH 1.116 05/31/2014   INR 1.00 11/19/2013    Mm Digital Screening Bilateral  02/28/2014   CLINICAL DATA:  Screening.  EXAM: DIGITAL SCREENING BILATERAL MAMMOGRAM WITH CAD  COMPARISON:  Previous exam(s).  ACR Breast Density Category c: The breast tissue is heterogeneously dense, which may obscure small masses.  FINDINGS: There are no findings suspicious for malignancy. Images were processed with CAD.  IMPRESSION: No mammographic evidence of malignancy. A result letter of this screening mammogram will be mailed directly to the patient.  RECOMMENDATION: Screening mammogram in one year. (Code:SM-B-01Y)  BI-RADS CATEGORY  1: Negative.   Electronically Signed   By: Lajean Manes M.D.   On: 02/28/2014 13:29     Assessment & Plan:  Plan I am having Ms. Roper maintain her doxepin, cholecalciferol, Fish Oil, aspirin, Melatonin, and PEGASYS.  Meds ordered this encounter  Medications  . PEGASYS 180 MCG/ML injection  Sig:     Refill:  1    Problem List Items Addressed This Visit    None    1. Fungal infection of skin  - clotrimazole-betamethasone (LOTRISONE) cream; Apply 1 application topically 2 (two) times daily.  Dispense: 30 g; Refill: 0 rto cpe Follow-up: No Follow-up on file.  Garnet Koyanagi, DO

## 2014-06-28 NOTE — Progress Notes (Signed)
Pre visit review using our clinic review tool, if applicable. No additional management support is needed unless otherwise documented below in the visit note. 

## 2014-06-28 NOTE — Patient Instructions (Signed)

## 2014-07-01 ENCOUNTER — Encounter: Payer: Self-pay | Admitting: Family Medicine

## 2014-07-01 ENCOUNTER — Ambulatory Visit (INDEPENDENT_AMBULATORY_CARE_PROVIDER_SITE_OTHER): Payer: BLUE CROSS/BLUE SHIELD | Admitting: Family Medicine

## 2014-07-01 ENCOUNTER — Encounter: Payer: Self-pay | Admitting: Internal Medicine

## 2014-07-01 ENCOUNTER — Other Ambulatory Visit (HOSPITAL_COMMUNITY)
Admission: RE | Admit: 2014-07-01 | Discharge: 2014-07-01 | Disposition: A | Discharge status: Home / Self Care | Payer: BLUE CROSS/BLUE SHIELD | Source: Ambulatory Visit | Attending: Family Medicine | Admitting: Family Medicine

## 2014-07-01 VITALS — BP 114/80 | HR 59 | Temp 97.8°F | Ht 67.0 in | Wt 222.4 lb

## 2014-07-01 DIAGNOSIS — R195 Other fecal abnormalities: Secondary | ICD-10-CM | POA: Diagnosis not present

## 2014-07-01 DIAGNOSIS — Z01419 Encounter for gynecological examination (general) (routine) without abnormal findings: Secondary | ICD-10-CM | POA: Insufficient documentation

## 2014-07-01 DIAGNOSIS — D471 Chronic myeloproliferative disease: Secondary | ICD-10-CM | POA: Diagnosis not present

## 2014-07-01 DIAGNOSIS — Z1151 Encounter for screening for human papillomavirus (HPV): Secondary | ICD-10-CM | POA: Insufficient documentation

## 2014-07-01 DIAGNOSIS — K219 Gastro-esophageal reflux disease without esophagitis: Secondary | ICD-10-CM | POA: Diagnosis not present

## 2014-07-01 DIAGNOSIS — Z23 Encounter for immunization: Secondary | ICD-10-CM | POA: Diagnosis not present

## 2014-07-01 DIAGNOSIS — Z0000 Encounter for general adult medical examination without abnormal findings: Secondary | ICD-10-CM

## 2014-07-01 DIAGNOSIS — Z86010 Personal history of colonic polyps: Secondary | ICD-10-CM

## 2014-07-01 DIAGNOSIS — Z124 Encounter for screening for malignant neoplasm of cervix: Secondary | ICD-10-CM

## 2014-07-01 DIAGNOSIS — R319 Hematuria, unspecified: Secondary | ICD-10-CM

## 2014-07-01 DIAGNOSIS — R8290 Unspecified abnormal findings in urine: Secondary | ICD-10-CM

## 2014-07-01 LAB — POCT URINALYSIS DIPSTICK
Bilirubin, UA: NEGATIVE
Glucose, UA: NEGATIVE
KETONES UA: NEGATIVE
Leukocytes, UA: NEGATIVE
Nitrite, UA: NEGATIVE
PH UA: 6
Spec Grav, UA: 1.025
UROBILINOGEN UA: 0.2

## 2014-07-01 LAB — BASIC METABOLIC PANEL
BUN: 14 mg/dL (ref 6–23)
CALCIUM: 10.7 mg/dL — AB (ref 8.4–10.5)
CHLORIDE: 102 meq/L (ref 96–112)
CO2: 30 mEq/L (ref 19–32)
CREATININE: 0.62 mg/dL (ref 0.40–1.20)
GFR: 110.01 mL/min (ref >60.00)
GLUCOSE: 93 mg/dL (ref 70–99)
POTASSIUM: 4.1 meq/L (ref 3.5–5.1)
Sodium: 137 mEq/L (ref 135–145)

## 2014-07-01 LAB — CBC WITH DIFFERENTIAL/PLATELET
Basophils Absolute: 0 10*3/uL (ref 0.0–0.1)
Basophils Relative: 0.1 % (ref 0.0–3.0)
EOS PCT: 0 % (ref 0.0–5.0)
Eosinophils Absolute: 0 10*3/uL (ref 0.0–0.7)
HCT: 39.6 % (ref 36.0–46.0)
Hemoglobin: 13.2 g/dL (ref 12.0–15.0)
LYMPHS ABS: 2.2 10*3/uL (ref 0.7–4.0)
LYMPHS PCT: 12.8 % (ref 12.0–46.0)
MCHC: 33.3 g/dL (ref 30.0–36.0)
MCV: 93 fl (ref 78.0–100.0)
MONO ABS: 0.8 10*3/uL (ref 0.1–1.0)
Monocytes Relative: 4.6 % (ref 3.0–12.0)
NEUTROS ABS: 14.1 10*3/uL — AB (ref 1.4–7.7)
NEUTROS PCT: 82.5 % — AB (ref 43.0–77.0)
Platelets: 233 10*3/uL (ref 150.0–400.0)
RBC: 4.26 Mil/uL (ref 3.87–5.11)
RDW: 15.4 % (ref 11.5–15.5)
WBC: 17.1 10*3/uL — ABNORMAL HIGH (ref 4.0–10.5)

## 2014-07-01 LAB — HEPATIC FUNCTION PANEL
ALBUMIN: 4.4 g/dL (ref 3.5–5.2)
ALT: 15 U/L (ref 0–35)
AST: 21 U/L (ref 0–37)
Alkaline Phosphatase: 151 U/L — ABNORMAL HIGH (ref 39–117)
BILIRUBIN TOTAL: 0.5 mg/dL (ref 0.2–1.2)
Bilirubin, Direct: 0.1 mg/dL (ref 0.0–0.3)
Total Protein: 7.8 g/dL (ref 6.0–8.3)

## 2014-07-01 LAB — LIPID PANEL
CHOL/HDL RATIO: 4
Cholesterol: 172 mg/dL (ref 0–200)
HDL: 46.9 mg/dL (ref >39.00)
LDL CALC: 109 mg/dL — AB (ref 0–99)
NonHDL: 125.1
TRIGLYCERIDES: 82 mg/dL (ref 0.0–149.0)
VLDL: 16.4 mg/dL (ref 0.0–40.0)

## 2014-07-01 MED ORDER — OMEPRAZOLE 20 MG PO CPDR: 20.0000 mg | DELAYED_RELEASE_CAPSULE | Freq: Every day | ORAL | Status: DC

## 2014-07-01 NOTE — Progress Notes (Signed)
Pre visit review using our clinic review tool, if applicable. No additional management support is needed unless otherwise documented below in the visit note. 

## 2014-07-01 NOTE — Progress Notes (Signed)
Subjective:     Brianna Fletcher is a 44 y.o. female and is here for a comprehensive physical exam. The patient reports no new problems.  History   Social History  . Marital Status: Divorced    Spouse Name: N/A  . Number of Children: N/A  . Years of Education: N/A   Occupational History  . Not on file.   Social History Main Topics  . Smoking status: Never Smoker   . Smokeless tobacco: Never Used     Comment: never used tobacco  . Alcohol Use: No  . Drug Use: No  . Sexual Activity:    Partners: Male   Other Topics Concern  . Not on file   Social History Narrative   Health Maintenance  Topic Date Due  . HIV Screening  03/27/1983  . TETANUS/TDAP  03/27/1987  . INFLUENZA VACCINE  10/21/2014  . MAMMOGRAM  02/28/2015  . PAP SMEAR  12/23/2015    The following portions of the patient's history were reviewed and updated as appropriate:  She  has a past medical history of Glaucoma; Arthritis; Leukocytosis, unspecified (11/15/2012); Chronic neutrophilia (11/15/2012); Pruritus (11/15/2012); Monocytosis (11/15/2012); and Myeloproliferative disorder (03/08/2013). She  does not have any pertinent problems on file. She  has past surgical history that includes Tubal ligation (2000) and Eye surgery. Her family history includes Cancer in her father; Dementia in her maternal grandmother; Diabetes in her father; Heart disease in her maternal grandmother and mother; Heart disease (age of onset: 54) in her maternal grandfather; Hyperlipidemia in her mother; Hypertension in her mother. She  reports that she has never smoked. She has never used smokeless tobacco. She reports that she does not drink alcohol or use illicit drugs. She has a current medication list which includes the following prescription(s): aspirin, cholecalciferol, clotrimazole-betamethasone, doxepin, melatonin, fish oil, pegasys, and omeprazole, and the following Facility-Administered Medications: acetaminophen and peginterferon  alfa-2a. Current Outpatient Prescriptions on File Prior to Visit  Medication Sig Dispense Refill  . aspirin 81 MG tablet Take 81 mg by mouth daily.    . cholecalciferol (VITAMIN D) 1000 UNITS tablet Take 2,000 Units by mouth daily.    . clotrimazole-betamethasone (LOTRISONE) cream Apply 1 application topically 2 (two) times daily. 30 g 0  . doxepin (SINEQUAN) 50 MG capsule TAKE 1 CAPSULE BY MOUTH AT BEDTIME 30 capsule 3  . Melatonin 5 MG TABS Take by mouth as needed.    . Omega-3 Fatty Acids (FISH OIL) 1000 MG CAPS Take by mouth every morning.    Marland Kitchen PEGASYS 180 MCG/ML injection   1   Current Facility-Administered Medications on File Prior to Visit  Medication Dose Route Frequency Provider Last Rate Last Dose  . acetaminophen (TYLENOL) tablet 650 mg  650 mg Oral Once [name], MD   650 mg at 04/26/14 1516  . peginterferon alfa-2a (PEGASYS) injection 81 mcg  81 mcg Subcutaneous Weekly [name], MD   81 mcg at 04/26/14 1530   She has No Known Allergies..  Review of Systems Review of Systems  Constitutional: Negative for activity change, appetite change and fatigue.  HENT: Negative for hearing loss, congestion, tinnitus and ear discharge.  dentist q38m Eyes: Negative for visual disturbance (see optho q1y -- vision corrected to 20/20 with glasses).  Respiratory: Negative for cough, chest tightness and shortness of breath.   Cardiovascular: Negative for chest pain, palpitations and leg swelling.  Gastrointestinal: Negative for abdominal pain, diarrhea, constipation and abdominal distention.  Genitourinary: Negative for urgency,  frequency, decreased urine volume and difficulty urinating.  Musculoskeletal: Negative for back pain, arthralgias and gait problem.  Skin: Negative for color change, pallor and rash.  Neurological: Negative for dizziness, light-headedness, numbness and headaches.  Hematological: Negative for adenopathy. Does not bruise/bleed easily.   Psychiatric/Behavioral: Negative for suicidal ideas, confusion, sleep disturbance, self-injury, dysphoric mood, decreased concentration and agitation.       Objective:    BP 114/80 mmHg  Pulse 59  Temp(Src) 97.8 F (36.6 C) (Oral)  Ht 5\' 7"  (1.702 m)  Wt 222 lb 6.4 oz (100.88 kg)  BMI 34.82 kg/m2  SpO2 97%  LMP 06/21/2014 General appearance: alert, cooperative, appears stated age and no distress Head: Normocephalic, without obvious abnormality, atraumatic Eyes: conjunctivae/corneas clear. PERRL, EOM's intact. Fundi benign. Ears: normal TM's and external ear canals both ears Nose: Nares normal. Septum midline. Mucosa normal. No drainage or sinus tenderness. Throat: lips, mucosa, and tongue normal; teeth and gums normal Neck: no adenopathy, no carotid bruit, no JVD, supple, symmetrical, trachea midline and thyroid not enlarged, symmetric, no tenderness/mass/nodules Back: symmetric, no curvature. ROM normal. No CVA tenderness. Lungs: clear to auscultation bilaterally Breasts: normal appearance, no masses or tenderness Heart: regular rate and rhythm, S1, S2 normal, no murmur, click, rub or gallop Abdomen: soft, non-tender; bowel sounds normal; no masses,  no organomegaly Pelvic: cervix normal in appearance, external genitalia normal, no adnexal masses or tenderness, no cervical motion tenderness, rectovaginal septum normal, uterus normal size, shape, and consistency, vagina normal without discharge and pap done  Rectal- heme + brown stool Extremities: extremities normal, atraumatic, no cyanosis or edema Pulses: 2+ and symmetric Skin: Skin color, texture, turgor normal. No rashes or lesions Lymph nodes: Cervical, supraclavicular, and axillary nodes normal. Neurologic: Alert and oriented X 3, normal strength and tone. Normal symmetric reflexes. Normal coordination and gait Psych- no depression, no anxiety   Assessment:    Healthy female exam.     Plan:     ghm utd Check   labs See After Visit Summary for Counseling Recommendations     1. Preventative health care  - Basic metabolic panel - CBC with Differential/Platelet - Hepatic function panel - Lipid panel - POCT urinalysis dipstick - TSH - HIV antibody  2. Myeloproliferative disorder Per hematolgoy - Basic metabolic panel - CBC with Differential/Platelet - Hepatic function panel - Lipid panel - POCT urinalysis dipstick - TSH - HIV antibody  3. Gastroesophageal reflux disease, esophagitis presence not specified  - omeprazole (PRILOSEC) 20 MG capsule; Take 1 capsule (20 mg total) by mouth daily.  Dispense: 30 capsule; Refill: 3  4. Heme positive stool Hx polyps-- refer to gi for colonoscopy and posibble EGD - Ambulatory referral to Gastroenterology  5. Hx of colonic polyp  - Ambulatory referral to Gastroenterology

## 2014-07-01 NOTE — Patient Instructions (Signed)
Preventive Care for Adults A healthy lifestyle and preventive care can promote health and wellness. Preventive health guidelines for women include the following key practices.  A routine yearly physical is a good way to check with your health care provider about your health and preventive screening. It is a chance to share any concerns and updates on your health and to receive a thorough exam.  Visit your dentist for a routine exam and preventive care every 6 months. Brush your teeth twice a day and floss once a day. Good oral hygiene prevents tooth decay and gum disease.  The frequency of eye exams is based on your age, health, family medical history, use of contact lenses, and other factors. Follow your health care provider's recommendations for frequency of eye exams.  Eat a healthy diet. Foods like vegetables, fruits, whole grains, low-fat dairy products, and lean protein foods contain the nutrients you need without too many calories. Decrease your intake of foods high in solid fats, added sugars, and salt. Eat the right amount of calories for you.Get information about a proper diet from your health care provider, if necessary.  Regular physical exercise is one of the most important things you can do for your health. Most adults should get at least 150 minutes of moderate-intensity exercise (any activity that increases your heart rate and causes you to sweat) each week. In addition, most adults need muscle-strengthening exercises on 2 or more days a week.  Maintain a healthy weight. The body mass index (BMI) is a screening tool to identify possible weight problems. It provides an estimate of body fat based on height and weight. Your health care provider can find your BMI and can help you achieve or maintain a healthy weight.For adults 20 years and older:  A BMI below 18.5 is considered underweight.  A BMI of 18.5 to 24.9 is normal.  A BMI of 25 to 29.9 is considered overweight.  A BMI of  30 and above is considered obese.  Maintain normal blood lipids and cholesterol levels by exercising and minimizing your intake of saturated fat. Eat a balanced diet with plenty of fruit and vegetables. Blood tests for lipids and cholesterol should begin at age 76 and be repeated every 5 years. If your lipid or cholesterol levels are high, you are over 50, or you are at high risk for heart disease, you may need your cholesterol levels checked more frequently.Ongoing high lipid and cholesterol levels should be treated with medicines if diet and exercise are not working.  If you smoke, find out from your health care provider how to quit. If you do not use tobacco, do not start.  Lung cancer screening is recommended for adults aged 22-80 years who are at high risk for developing lung cancer because of a history of smoking. A yearly low-dose CT scan of the lungs is recommended for people who have at least a 30-pack-year history of smoking and are a current smoker or have quit within the past 15 years. A pack year of smoking is smoking an average of 1 pack of cigarettes a day for 1 year (for example: 1 pack a day for 30 years or 2 packs a day for 15 years). Yearly screening should continue until the smoker has stopped smoking for at least 15 years. Yearly screening should be stopped for people who develop a health problem that would prevent them from having lung cancer treatment.  If you are pregnant, do not drink alcohol. If you are breastfeeding,  be very cautious about drinking alcohol. If you are not pregnant and choose to drink alcohol, do not have more than 1 drink per day. One drink is considered to be 12 ounces (355 mL) of beer, 5 ounces (148 mL) of wine, or 1.5 ounces (44 mL) of liquor.  Avoid use of street drugs. Do not share needles with anyone. Ask for help if you need support or instructions about stopping the use of drugs.  High blood pressure causes heart disease and increases the risk of  stroke. Your blood pressure should be checked at least every 1 to 2 years. Ongoing high blood pressure should be treated with medicines if weight loss and exercise do not work.  If you are 3-86 years old, ask your health care provider if you should take aspirin to prevent strokes.  Diabetes screening involves taking a blood sample to check your fasting blood sugar level. This should be done once every 3 years, after age 67, if you are within normal weight and without risk factors for diabetes. Testing should be considered at a younger age or be carried out more frequently if you are overweight and have at least 1 risk factor for diabetes.  Breast cancer screening is essential preventive care for women. You should practice "breast self-awareness." This means understanding the normal appearance and feel of your breasts and may include breast self-examination. Any changes detected, no matter how small, should be reported to a health care provider. Women in their 8s and 30s should have a clinical breast exam (CBE) by a health care provider as part of a regular health exam every 1 to 3 years. After age 70, women should have a CBE every year. Starting at age 25, women should consider having a mammogram (breast X-ray test) every year. Women who have a family history of breast cancer should talk to their health care provider about genetic screening. Women at a high risk of breast cancer should talk to their health care providers about having an MRI and a mammogram every year.  Breast cancer gene (BRCA)-related cancer risk assessment is recommended for women who have family members with BRCA-related cancers. BRCA-related cancers include breast, ovarian, tubal, and peritoneal cancers. Having family members with these cancers may be associated with an increased risk for harmful changes (mutations) in the breast cancer genes BRCA1 and BRCA2. Results of the assessment will determine the need for genetic counseling and  BRCA1 and BRCA2 testing.  Routine pelvic exams to screen for cancer are no longer recommended for nonpregnant women who are considered low risk for cancer of the pelvic organs (ovaries, uterus, and vagina) and who do not have symptoms. Ask your health care provider if a screening pelvic exam is right for you.  If you have had past treatment for cervical cancer or a condition that could lead to cancer, you need Pap tests and screening for cancer for at least 20 years after your treatment. If Pap tests have been discontinued, your risk factors (such as having a new sexual partner) need to be reassessed to determine if screening should be resumed. Some women have medical problems that increase the chance of getting cervical cancer. In these cases, your health care provider may recommend more frequent screening and Pap tests.  The HPV test is an additional test that may be used for cervical cancer screening. The HPV test looks for the virus that can cause the cell changes on the cervix. The cells collected during the Pap test can be  tested for HPV. The HPV test could be used to screen women aged 30 years and older, and should be used in women of any age who have unclear Pap test results. After the age of 30, women should have HPV testing at the same frequency as a Pap test.  Colorectal cancer can be detected and often prevented. Most routine colorectal cancer screening begins at the age of 50 years and continues through age 75 years. However, your health care provider may recommend screening at an earlier age if you have risk factors for colon cancer. On a yearly basis, your health care provider may provide home test kits to check for hidden blood in the stool. Use of a small camera at the end of a tube, to directly examine the colon (sigmoidoscopy or colonoscopy), can detect the earliest forms of colorectal cancer. Talk to your health care provider about this at age 50, when routine screening begins. Direct  exam of the colon should be repeated every 5-10 years through age 75 years, unless early forms of pre-cancerous polyps or small growths are found.  People who are at an increased risk for hepatitis B should be screened for this virus. You are considered at high risk for hepatitis B if:  You were born in a country where hepatitis B occurs often. Talk with your health care provider about which countries are considered high risk.  Your parents were born in a high-risk country and you have not received a shot to protect against hepatitis B (hepatitis B vaccine).  You have HIV or AIDS.  You use needles to inject street drugs.  You live with, or have sex with, someone who has hepatitis B.  You get hemodialysis treatment.  You take certain medicines for conditions like cancer, organ transplantation, and autoimmune conditions.  Hepatitis C blood testing is recommended for all people born from 1945 through 1965 and any individual with known risks for hepatitis C.  Practice safe sex. Use condoms and avoid high-risk sexual practices to reduce the spread of sexually transmitted infections (STIs). STIs include gonorrhea, chlamydia, syphilis, trichomonas, herpes, HPV, and human immunodeficiency virus (HIV). Herpes, HIV, and HPV are viral illnesses that have no cure. They can result in disability, cancer, and death.  You should be screened for sexually transmitted illnesses (STIs) including gonorrhea and chlamydia if:  You are sexually active and are younger than 24 years.  You are older than 24 years and your health care provider tells you that you are at risk for this type of infection.  Your sexual activity has changed since you were last screened and you are at an increased risk for chlamydia or gonorrhea. Ask your health care provider if you are at risk.  If you are at risk of being infected with HIV, it is recommended that you take a prescription medicine daily to prevent HIV infection. This is  called preexposure prophylaxis (PrEP). You are considered at risk if:  You are a heterosexual woman, are sexually active, and are at increased risk for HIV infection.  You take drugs by injection.  You are sexually active with a partner who has HIV.  Talk with your health care provider about whether you are at high risk of being infected with HIV. If you choose to begin PrEP, you should first be tested for HIV. You should then be tested every 3 months for as long as you are taking PrEP.  Osteoporosis is a disease in which the bones lose minerals and strength   with aging. This can result in serious bone fractures or breaks. The risk of osteoporosis can be identified using a bone density scan. Women ages 65 years and over and women at risk for fractures or osteoporosis should discuss screening with their health care providers. Ask your health care provider whether you should take a calcium supplement or vitamin D to reduce the rate of osteoporosis.  Menopause can be associated with physical symptoms and risks. Hormone replacement therapy is available to decrease symptoms and risks. You should talk to your health care provider about whether hormone replacement therapy is right for you.  Use sunscreen. Apply sunscreen liberally and repeatedly throughout the day. You should seek shade when your shadow is shorter than you. Protect yourself by wearing long sleeves, pants, a wide-brimmed hat, and sunglasses year round, whenever you are outdoors.  Once a month, do a whole body skin exam, using a mirror to look at the skin on your back. Tell your health care provider of new moles, moles that have irregular borders, moles that are larger than a pencil eraser, or moles that have changed in shape or color.  Stay current with required vaccines (immunizations).  Influenza vaccine. All adults should be immunized every year.  Tetanus, diphtheria, and acellular pertussis (Td, Tdap) vaccine. Pregnant women should  receive 1 dose of Tdap vaccine during each pregnancy. The dose should be obtained regardless of the length of time since the last dose. Immunization is preferred during the 27th-36th week of gestation. An adult who has not previously received Tdap or who does not know her vaccine status should receive 1 dose of Tdap. This initial dose should be followed by tetanus and diphtheria toxoids (Td) booster doses every 10 years. Adults with an unknown or incomplete history of completing a 3-dose immunization series with Td-containing vaccines should begin or complete a primary immunization series including a Tdap dose. Adults should receive a Td booster every 10 years.  Varicella vaccine. An adult without evidence of immunity to varicella should receive 2 doses or a second dose if she has previously received 1 dose. Pregnant females who do not have evidence of immunity should receive the first dose after pregnancy. This first dose should be obtained before leaving the health care facility. The second dose should be obtained 4-8 weeks after the first dose.  Human papillomavirus (HPV) vaccine. Females aged 13-26 years who have not received the vaccine previously should obtain the 3-dose series. The vaccine is not recommended for use in pregnant females. However, pregnancy testing is not needed before receiving a dose. If a female is found to be pregnant after receiving a dose, no treatment is needed. In that case, the remaining doses should be delayed until after the pregnancy. Immunization is recommended for any person with an immunocompromised condition through the age of 26 years if she did not get any or all doses earlier. During the 3-dose series, the second dose should be obtained 4-8 weeks after the first dose. The third dose should be obtained 24 weeks after the first dose and 16 weeks after the second dose.  Zoster vaccine. One dose is recommended for adults aged 60 years or older unless certain conditions are  present.  Measles, mumps, and rubella (MMR) vaccine. Adults born before 1957 generally are considered immune to measles and mumps. Adults born in 1957 or later should have 1 or more doses of MMR vaccine unless there is a contraindication to the vaccine or there is laboratory evidence of immunity to   each of the three diseases. A routine second dose of MMR vaccine should be obtained at least 28 days after the first dose for students attending postsecondary schools, health care workers, or international travelers. People who received inactivated measles vaccine or an unknown type of measles vaccine during 1963-1967 should receive 2 doses of MMR vaccine. People who received inactivated mumps vaccine or an unknown type of mumps vaccine before 1979 and are at high risk for mumps infection should consider immunization with 2 doses of MMR vaccine. For females of childbearing age, rubella immunity should be determined. If there is no evidence of immunity, females who are not pregnant should be vaccinated. If there is no evidence of immunity, females who are pregnant should delay immunization until after pregnancy. Unvaccinated health care workers born before 1957 who lack laboratory evidence of measles, mumps, or rubella immunity or laboratory confirmation of disease should consider measles and mumps immunization with 2 doses of MMR vaccine or rubella immunization with 1 dose of MMR vaccine.  Pneumococcal 13-valent conjugate (PCV13) vaccine. When indicated, a person who is uncertain of her immunization history and has no record of immunization should receive the PCV13 vaccine. An adult aged 19 years or older who has certain medical conditions and has not been previously immunized should receive 1 dose of PCV13 vaccine. This PCV13 should be followed with a dose of pneumococcal polysaccharide (PPSV23) vaccine. The PPSV23 vaccine dose should be obtained at least 8 weeks after the dose of PCV13 vaccine. An adult aged 19  years or older who has certain medical conditions and previously received 1 or more doses of PPSV23 vaccine should receive 1 dose of PCV13. The PCV13 vaccine dose should be obtained 1 or more years after the last PPSV23 vaccine dose.  Pneumococcal polysaccharide (PPSV23) vaccine. When PCV13 is also indicated, PCV13 should be obtained first. All adults aged 65 years and older should be immunized. An adult younger than age 65 years who has certain medical conditions should be immunized. Any person who resides in a nursing home or long-term care facility should be immunized. An adult smoker should be immunized. People with an immunocompromised condition and certain other conditions should receive both PCV13 and PPSV23 vaccines. People with human immunodeficiency virus (HIV) infection should be immunized as soon as possible after diagnosis. Immunization during chemotherapy or radiation therapy should be avoided. Routine use of PPSV23 vaccine is not recommended for American Indians, [place] Natives, or people younger than 65 years unless there are medical conditions that require PPSV23 vaccine. When indicated, people who have unknown immunization and have no record of immunization should receive PPSV23 vaccine. One-time revaccination 5 years after the first dose of PPSV23 is recommended for people aged 19-64 years who have chronic kidney failure, nephrotic syndrome, asplenia, or immunocompromised conditions. People who received 1-2 doses of PPSV23 before age 65 years should receive another dose of PPSV23 vaccine at age 65 years or later if at least 5 years have passed since the previous dose. Doses of PPSV23 are not needed for people immunized with PPSV23 at or after age 65 years.  Meningococcal vaccine. Adults with asplenia or persistent complement component deficiencies should receive 2 doses of quadrivalent meningococcal conjugate (MenACWY-D) vaccine. The doses should be obtained at least 2 months apart.  Microbiologists working with certain meningococcal bacteria, military recruits, people at risk during an outbreak, and people who travel to or live in countries with a high rate of meningitis should be immunized. A first-year college student up through age   21 years who is living in a residence hall should receive a dose if she did not receive a dose on or after her 16th birthday. Adults who have certain high-risk conditions should receive one or more doses of vaccine.  Hepatitis A vaccine. Adults who wish to be protected from this disease, have certain high-risk conditions, work with hepatitis A-infected animals, work in hepatitis A research labs, or travel to or work in countries with a high rate of hepatitis A should be immunized. Adults who were previously unvaccinated and who anticipate close contact with an international adoptee during the first 60 days after arrival in the [place] from a country with a high rate of hepatitis A should be immunized.  Hepatitis B vaccine. Adults who wish to be protected from this disease, have certain high-risk conditions, may be exposed to blood or other infectious body fluids, are household contacts or sex partners of hepatitis B positive people, are clients or workers in certain care facilities, or travel to or work in countries with a high rate of hepatitis B should be immunized.  Haemophilus influenzae type b (Hib) vaccine. A previously unvaccinated person with asplenia or sickle cell disease or having a scheduled splenectomy should receive 1 dose of Hib vaccine. Regardless of previous immunization, a recipient of a hematopoietic stem cell transplant should receive a 3-dose series 6-12 months after her successful transplant. Hib vaccine is not recommended for adults with HIV infection. Preventive Services / Frequency Ages 64 to 68 years  Blood pressure check.** / Every 1 to 2 years.  Lipid and cholesterol check.** / Every 5 years beginning at age  22.  Clinical breast exam.** / Every 3 years for women in their 88s and 53s.  BRCA-related cancer risk assessment.** / For women who have family members with a BRCA-related cancer (breast, ovarian, tubal, or peritoneal cancers).  Pap test.** / Every 2 years from ages 90 through 51. Every 3 years starting at age 21 through age 56 or 3 with a history of 3 consecutive normal Pap tests.  HPV screening.** / Every 3 years from ages 24 through ages 1 to 46 with a history of 3 consecutive normal Pap tests.  Hepatitis C blood test.** / For any individual with known risks for hepatitis C.  Skin self-exam. / Monthly.  Influenza vaccine. / Every year.  Tetanus, diphtheria, and acellular pertussis (Tdap, Td) vaccine.** / Consult your health care provider. Pregnant women should receive 1 dose of Tdap vaccine during each pregnancy. 1 dose of Td every 10 years.  Varicella vaccine.** / Consult your health care provider. Pregnant females who do not have evidence of immunity should receive the first dose after pregnancy.  HPV vaccine. / 3 doses over 6 months, if 72 and younger. The vaccine is not recommended for use in pregnant females. However, pregnancy testing is not needed before receiving a dose.  Measles, mumps, rubella (MMR) vaccine.** / You need at least 1 dose of MMR if you were born in 1957 or later. You may also need a 2nd dose. For females of childbearing age, rubella immunity should be determined. If there is no evidence of immunity, females who are not pregnant should be vaccinated. If there is no evidence of immunity, females who are pregnant should delay immunization until after pregnancy.  Pneumococcal 13-valent conjugate (PCV13) vaccine.** / Consult your health care provider.  Pneumococcal polysaccharide (PPSV23) vaccine.** / 1 to 2 doses if you smoke cigarettes or if you have certain conditions.  Meningococcal vaccine.** /  1 dose if you are age 19 to 21 years and a first-year college  student living in a residence hall, or have one of several medical conditions, you need to get vaccinated against meningococcal disease. You may also need additional booster doses.  Hepatitis A vaccine.** / Consult your health care provider.  Hepatitis B vaccine.** / Consult your health care provider.  Haemophilus influenzae type b (Hib) vaccine.** / Consult your health care provider. Ages 40 to 64 years  Blood pressure check.** / Every 1 to 2 years.  Lipid and cholesterol check.** / Every 5 years beginning at age 20 years.  Lung cancer screening. / Every year if you are aged 55-80 years and have a 30-pack-year history of smoking and currently smoke or have quit within the past 15 years. Yearly screening is stopped once you have quit smoking for at least 15 years or develop a health problem that would prevent you from having lung cancer treatment.  Clinical breast exam.** / Every year after age 40 years.  BRCA-related cancer risk assessment.** / For women who have family members with a BRCA-related cancer (breast, ovarian, tubal, or peritoneal cancers).  Mammogram.** / Every year beginning at age 40 years and continuing for as long as you are in good health. Consult with your health care provider.  Pap test.** / Every 3 years starting at age 30 years through age 65 or 70 years with a history of 3 consecutive normal Pap tests.  HPV screening.** / Every 3 years from ages 30 years through ages 65 to 70 years with a history of 3 consecutive normal Pap tests.  Fecal occult blood test (FOBT) of stool. / Every year beginning at age 50 years and continuing until age 75 years. You may not need to do this test if you get a colonoscopy every 10 years.  Flexible sigmoidoscopy or colonoscopy.** / Every 5 years for a flexible sigmoidoscopy or every 10 years for a colonoscopy beginning at age 50 years and continuing until age 75 years.  Hepatitis C blood test.** / For all people born from 1945 through  1965 and any individual with known risks for hepatitis C.  Skin self-exam. / Monthly.  Influenza vaccine. / Every year.  Tetanus, diphtheria, and acellular pertussis (Tdap/Td) vaccine.** / Consult your health care provider. Pregnant women should receive 1 dose of Tdap vaccine during each pregnancy. 1 dose of Td every 10 years.  Varicella vaccine.** / Consult your health care provider. Pregnant females who do not have evidence of immunity should receive the first dose after pregnancy.  Zoster vaccine.** / 1 dose for adults aged 60 years or older.  Measles, mumps, rubella (MMR) vaccine.** / You need at least 1 dose of MMR if you were born in 1957 or later. You may also need a 2nd dose. For females of childbearing age, rubella immunity should be determined. If there is no evidence of immunity, females who are not pregnant should be vaccinated. If there is no evidence of immunity, females who are pregnant should delay immunization until after pregnancy.  Pneumococcal 13-valent conjugate (PCV13) vaccine.** / Consult your health care provider.  Pneumococcal polysaccharide (PPSV23) vaccine.** / 1 to 2 doses if you smoke cigarettes or if you have certain conditions.  Meningococcal vaccine.** / Consult your health care provider.  Hepatitis A vaccine.** / Consult your health care provider.  Hepatitis B vaccine.** / Consult your health care provider.  Haemophilus influenzae type b (Hib) vaccine.** / Consult your health care provider. Ages 65   years and over  Blood pressure check.** / Every 1 to 2 years.  Lipid and cholesterol check.** / Every 5 years beginning at age 22 years.  Lung cancer screening. / Every year if you are aged 73-80 years and have a 30-pack-year history of smoking and currently smoke or have quit within the past 15 years. Yearly screening is stopped once you have quit smoking for at least 15 years or develop a health problem that would prevent you from having lung cancer  treatment.  Clinical breast exam.** / Every year after age 4 years.  BRCA-related cancer risk assessment.** / For women who have family members with a BRCA-related cancer (breast, ovarian, tubal, or peritoneal cancers).  Mammogram.** / Every year beginning at age 40 years and continuing for as long as you are in good health. Consult with your health care provider.  Pap test.** / Every 3 years starting at age 9 years through age 34 or 91 years with 3 consecutive normal Pap tests. Testing can be stopped between 65 and 70 years with 3 consecutive normal Pap tests and no abnormal Pap or HPV tests in the past 10 years.  HPV screening.** / Every 3 years from ages 57 years through ages 64 or 45 years with a history of 3 consecutive normal Pap tests. Testing can be stopped between 65 and 70 years with 3 consecutive normal Pap tests and no abnormal Pap or HPV tests in the past 10 years.  Fecal occult blood test (FOBT) of stool. / Every year beginning at age 15 years and continuing until age 17 years. You may not need to do this test if you get a colonoscopy every 10 years.  Flexible sigmoidoscopy or colonoscopy.** / Every 5 years for a flexible sigmoidoscopy or every 10 years for a colonoscopy beginning at age 86 years and continuing until age 71 years.  Hepatitis C blood test.** / For all people born from [birth year] through 1967 and any individual with known risks for hepatitis C.  Osteoporosis screening.** / A one-time screening for women ages 83 years and over and women at risk for fractures or osteoporosis.  Skin self-exam. / Monthly.  Influenza vaccine. / Every year.  Tetanus, diphtheria, and acellular pertussis (Tdap/Td) vaccine.** / 1 dose of Td every 10 years.  Varicella vaccine.** / Consult your health care provider.  Zoster vaccine.** / 1 dose for adults aged 61 years or older.  Pneumococcal 13-valent conjugate (PCV13) vaccine.** / Consult your health care provider.  Pneumococcal  polysaccharide (PPSV23) vaccine.** / 1 dose for all adults aged 28 years and older.  Meningococcal vaccine.** / Consult your health care provider.  Hepatitis A vaccine.** / Consult your health care provider.  Hepatitis B vaccine.** / Consult your health care provider.  Haemophilus influenzae type b (Hib) vaccine.** / Consult your health care provider. ** Family history and personal history of risk and conditions may change your health care provider's recommendations. Document Released: 05/04/2001 Document Revised: 07/23/2013 Document Reviewed: 08/03/2010 [facility] Patient Information 2015 [place], [place]. This information is not intended to replace advice given to you by your health care provider. Make sure you discuss any questions you have with your health care provider.

## 2014-07-02 ENCOUNTER — Telehealth: Payer: Self-pay | Admitting: Family Medicine

## 2014-07-02 LAB — CYTOLOGY - PAP

## 2014-07-02 LAB — TSH: TSH: 1.3 u[IU]/mL (ref 0.35–4.50)

## 2014-07-02 LAB — URINE CULTURE: Colony Count: 50000

## 2014-07-02 LAB — HIV ANTIBODY (ROUTINE TESTING W REFLEX): HIV 1&2 Ab, 4th Generation: NONREACTIVE

## 2014-07-02 NOTE — Telephone Encounter (Signed)
Patient returned phone call. Best # [phone]  Patient cannot answer phone at desk. Patient states that it is ok to leave a detailed message with lab results.

## 2014-07-02 NOTE — Telephone Encounter (Signed)
Per patient request detailed message left advising HIV neg and I will call with the additional results when they are available.    KP

## 2014-07-02 NOTE — Telephone Encounter (Signed)
Message left to call the office.    KP 

## 2014-07-02 NOTE — Telephone Encounter (Signed)
Relation to pt: self  Call back number: [phone]   Reason for call:  Pt was able to review her HIV results among other lab results and she is concerned because it states a repeat is needed. Pt states please leave detail message on mobile #. Pt is at work and can not pick up cell phone

## 2014-07-03 NOTE — Progress Notes (Signed)
Added Vitamin D.  Unable to add the PTH.//AB/CMA

## 2014-07-04 ENCOUNTER — Other Ambulatory Visit: Payer: Self-pay | Admitting: *Deleted

## 2014-07-04 ENCOUNTER — Other Ambulatory Visit (INDEPENDENT_AMBULATORY_CARE_PROVIDER_SITE_OTHER): Payer: BLUE CROSS/BLUE SHIELD

## 2014-07-04 ENCOUNTER — Other Ambulatory Visit: Payer: Self-pay

## 2014-07-04 DIAGNOSIS — R748 Abnormal levels of other serum enzymes: Secondary | ICD-10-CM | POA: Diagnosis not present

## 2014-07-04 DIAGNOSIS — D72828 Other elevated white blood cell count: Secondary | ICD-10-CM

## 2014-07-04 LAB — VITAMIN D 25 HYDROXY (VIT D DEFICIENCY, FRACTURES): VITD: 22.68 ng/mL — AB (ref 30.00–100.00)

## 2014-07-04 MED ORDER — VITAMIN D (ERGOCALCIFEROL) 1.25 MG (50000 UNIT) PO CAPS: 50000.0000 [IU] | ORAL_CAPSULE | ORAL | Status: DC

## 2014-07-05 ENCOUNTER — Inpatient Hospital Stay: Payer: BLUE CROSS/BLUE SHIELD

## 2014-07-05 ENCOUNTER — Ambulatory Visit: Payer: BLUE CROSS/BLUE SHIELD | Admitting: Hematology & Oncology

## 2014-07-05 ENCOUNTER — Other Ambulatory Visit (HOSPITAL_BASED_OUTPATIENT_CLINIC_OR_DEPARTMENT_OTHER): Payer: BLUE CROSS/BLUE SHIELD

## 2014-07-05 ENCOUNTER — Other Ambulatory Visit: Payer: BLUE CROSS/BLUE SHIELD

## 2014-07-05 DIAGNOSIS — D471 Chronic myeloproliferative disease: Secondary | ICD-10-CM | POA: Diagnosis not present

## 2014-07-05 DIAGNOSIS — D72828 Other elevated white blood cell count: Secondary | ICD-10-CM

## 2014-07-05 LAB — CBC WITH DIFFERENTIAL (CANCER CENTER ONLY)
BASO#: 0.1 10*3/uL (ref 0.0–0.2)
BASO%: 0.5 % (ref 0.0–2.0)
EOS%: 0.1 % (ref 0.0–7.0)
Eosinophils Absolute: 0 10*3/uL (ref 0.0–0.5)
HCT: 39.2 % (ref 34.8–46.6)
HEMOGLOBIN: 13.1 g/dL (ref 11.6–15.9)
LYMPH#: 1.7 10*3/uL (ref 0.9–3.3)
LYMPH%: 8.8 % — ABNORMAL LOW (ref 14.0–48.0)
MCH: 31.9 pg (ref 26.0–34.0)
MCHC: 33.4 g/dL (ref 32.0–36.0)
MCV: 95 fL (ref 81–101)
MONO#: 0.8 10*3/uL (ref 0.1–0.9)
MONO%: 4 % (ref 0.0–13.0)
NEUT#: 17.2 10*3/uL — ABNORMAL HIGH (ref 1.5–6.5)
NEUT%: 86.6 % — AB (ref 39.6–80.0)
Platelets: 240 10*3/uL (ref 145–400)
RBC: 4.11 10*6/uL (ref 3.70–5.32)
RDW: 14.9 % (ref 11.1–15.7)
WBC: 19.8 10*3/uL — ABNORMAL HIGH (ref 3.9–10.0)

## 2014-07-05 LAB — COMPREHENSIVE METABOLIC PANEL
ALK PHOS: 141 U/L — AB (ref 39–117)
ALT: 14 U/L (ref 0–35)
AST: 19 U/L (ref 0–37)
Albumin: 4.3 g/dL (ref 3.5–5.2)
BUN: 11 mg/dL (ref 6–23)
CALCIUM: 10.6 mg/dL — AB (ref 8.4–10.5)
CO2: 24 mEq/L (ref 19–32)
Chloride: 100 mEq/L (ref 96–112)
Creatinine, Ser: 0.72 mg/dL (ref 0.50–1.10)
GLUCOSE: 127 mg/dL — AB (ref 70–99)
POTASSIUM: 3.5 meq/L (ref 3.5–5.3)
Sodium: 135 mEq/L (ref 135–145)
TOTAL PROTEIN: 7.4 g/dL (ref 6.0–8.3)
Total Bilirubin: 0.4 mg/dL (ref 0.2–1.2)

## 2014-07-05 LAB — TECHNOLOGIST REVIEW CHCC SATELLITE

## 2014-07-08 ENCOUNTER — Encounter: Payer: Self-pay | Admitting: *Deleted

## 2014-07-08 ENCOUNTER — Other Ambulatory Visit (INDEPENDENT_AMBULATORY_CARE_PROVIDER_SITE_OTHER): Payer: BLUE CROSS/BLUE SHIELD

## 2014-07-08 ENCOUNTER — Other Ambulatory Visit: Payer: Self-pay | Admitting: *Deleted

## 2014-07-08 NOTE — Progress Notes (Signed)
FMLA paperwork faxed to [facility] per patient request

## 2014-07-09 ENCOUNTER — Other Ambulatory Visit: Payer: Self-pay | Admitting: Family Medicine

## 2014-07-09 ENCOUNTER — Telehealth: Payer: Self-pay | Admitting: Hematology & Oncology

## 2014-07-09 DIAGNOSIS — E213 Hyperparathyroidism, unspecified: Secondary | ICD-10-CM

## 2014-07-09 LAB — PTH, INTACT AND CALCIUM
Calcium: 9.3 mg/dL (ref 8.4–10.5)
PTH: 93 pg/mL — AB (ref 14–64)

## 2014-07-09 NOTE — Telephone Encounter (Signed)
FMLA papers completed and the desk nurse [facility] faxed them to:  [employer]  F: [phone]   Case: [id number]  Faxed: 07/08/2014   COPY SCANNED    CONSENT COPY SCANNED

## 2014-07-10 ENCOUNTER — Telehealth: Payer: Self-pay

## 2014-07-10 NOTE — Telephone Encounter (Signed)
Patient notified of results. Endo appointment has already been scheduled. No concerns at this time.

## 2014-07-10 NOTE — Telephone Encounter (Signed)
-----   Message from Rosalita Chessman, DO sent at 07/09/2014  2:11 PM EDT ----- PTH is elevated-- we will need to refer you to an endocrinologist for further evaluation

## 2014-07-10 NOTE — Telephone Encounter (Signed)
LMOVM

## 2014-07-11 ENCOUNTER — Other Ambulatory Visit: Payer: Self-pay | Admitting: *Deleted

## 2014-07-11 DIAGNOSIS — D471 Chronic myeloproliferative disease: Secondary | ICD-10-CM

## 2014-07-12 ENCOUNTER — Other Ambulatory Visit (HOSPITAL_BASED_OUTPATIENT_CLINIC_OR_DEPARTMENT_OTHER): Payer: BLUE CROSS/BLUE SHIELD

## 2014-07-12 DIAGNOSIS — D471 Chronic myeloproliferative disease: Secondary | ICD-10-CM

## 2014-07-12 LAB — CBC WITH DIFFERENTIAL (CANCER CENTER ONLY)
BASO#: 0 10*3/uL (ref 0.0–0.2)
BASO%: 0.1 % (ref 0.0–2.0)
EOS%: 0.1 % (ref 0.0–7.0)
Eosinophils Absolute: 0 10*3/uL (ref 0.0–0.5)
HCT: 37.4 % (ref 34.8–46.6)
HGB: 12.2 g/dL (ref 11.6–15.9)
LYMPH#: 2.7 10*3/uL (ref 0.9–3.3)
LYMPH%: 16.6 % (ref 14.0–48.0)
MCH: 31.6 pg (ref 26.0–34.0)
MCHC: 32.6 g/dL (ref 32.0–36.0)
MCV: 97 fL (ref 81–101)
MONO#: 0.8 10*3/uL (ref 0.1–0.9)
MONO%: 5.2 % (ref 0.0–13.0)
NEUT%: 78 % (ref 39.6–80.0)
NEUTROS ABS: 12.6 10*3/uL — AB (ref 1.5–6.5)
PLATELETS: 217 10*3/uL (ref 145–400)
RBC: 3.86 10*6/uL (ref 3.70–5.32)
RDW: 14.6 % (ref 11.1–15.7)
WBC: 16.2 10*3/uL — AB (ref 3.9–10.0)

## 2014-07-12 LAB — COMPREHENSIVE METABOLIC PANEL
ALBUMIN: 4.1 g/dL (ref 3.5–5.2)
ALT: 14 U/L (ref 0–35)
AST: 16 U/L (ref 0–37)
Alkaline Phosphatase: 137 U/L — ABNORMAL HIGH (ref 39–117)
BUN: 12 mg/dL (ref 6–23)
CHLORIDE: 102 meq/L (ref 96–112)
CO2: 25 mEq/L (ref 19–32)
Calcium: 9.8 mg/dL (ref 8.4–10.5)
Creatinine, Ser: 0.62 mg/dL (ref 0.50–1.10)
GLUCOSE: 124 mg/dL — AB (ref 70–99)
Potassium: 3.9 mEq/L (ref 3.5–5.3)
Sodium: 137 mEq/L (ref 135–145)
Total Bilirubin: 0.5 mg/dL (ref 0.2–1.2)
Total Protein: 6.9 g/dL (ref 6.0–8.3)

## 2014-07-12 LAB — TECHNOLOGIST REVIEW CHCC SATELLITE

## 2014-07-18 ENCOUNTER — Other Ambulatory Visit: Payer: Self-pay | Admitting: *Deleted

## 2014-07-18 DIAGNOSIS — D72828 Other elevated white blood cell count: Secondary | ICD-10-CM

## 2014-07-19 ENCOUNTER — Other Ambulatory Visit (HOSPITAL_BASED_OUTPATIENT_CLINIC_OR_DEPARTMENT_OTHER): Payer: BLUE CROSS/BLUE SHIELD

## 2014-07-19 DIAGNOSIS — D72828 Other elevated white blood cell count: Secondary | ICD-10-CM

## 2014-07-19 DIAGNOSIS — D471 Chronic myeloproliferative disease: Secondary | ICD-10-CM

## 2014-07-19 LAB — CBC WITH DIFFERENTIAL (CANCER CENTER ONLY)
BASO#: 0 10*3/uL (ref 0.0–0.2)
BASO%: 0.1 % (ref 0.0–2.0)
EOS ABS: 0.1 10*3/uL (ref 0.0–0.5)
EOS%: 0.3 % (ref 0.0–7.0)
HCT: 37.1 % (ref 34.8–46.6)
HGB: 12.2 g/dL (ref 11.6–15.9)
LYMPH#: 2.6 10*3/uL (ref 0.9–3.3)
LYMPH%: 15.5 % (ref 14.0–48.0)
MCH: 31.5 pg (ref 26.0–34.0)
MCHC: 32.9 g/dL (ref 32.0–36.0)
MCV: 96 fL (ref 81–101)
MONO#: 1 10*3/uL — AB (ref 0.1–0.9)
MONO%: 6.1 % (ref 0.0–13.0)
NEUT#: 13.2 10*3/uL — ABNORMAL HIGH (ref 1.5–6.5)
NEUT%: 78 % (ref 39.6–80.0)
Platelets: 225 10*3/uL (ref 145–400)
RBC: 3.87 10*6/uL (ref 3.70–5.32)
RDW: 14.3 % (ref 11.1–15.7)
WBC: 17 10*3/uL — AB (ref 3.9–10.0)

## 2014-07-19 LAB — CMP (CANCER CENTER ONLY)
ALBUMIN: 4 g/dL (ref 3.3–5.5)
ALT(SGPT): 22 U/L (ref 10–47)
AST: 24 U/L (ref 11–38)
Alkaline Phosphatase: 137 U/L — ABNORMAL HIGH (ref 26–84)
BUN: 13 mg/dL (ref 7–22)
CALCIUM: 10.5 mg/dL — AB (ref 8.0–10.3)
CO2: 29 mEq/L (ref 18–33)
Chloride: 104 mEq/L (ref 98–108)
Creat: 0.8 mg/dl (ref 0.6–1.2)
Glucose, Bld: 95 mg/dL (ref 73–118)
Potassium: 4.2 mEq/L (ref 3.3–4.7)
SODIUM: 141 meq/L (ref 128–145)
Total Bilirubin: 0.6 mg/dl (ref 0.20–1.60)
Total Protein: 7.8 g/dL (ref 6.4–8.1)

## 2014-07-22 ENCOUNTER — Ambulatory Visit (INDEPENDENT_AMBULATORY_CARE_PROVIDER_SITE_OTHER): Payer: BLUE CROSS/BLUE SHIELD | Admitting: Endocrinology

## 2014-07-22 ENCOUNTER — Encounter: Payer: Self-pay | Admitting: Endocrinology

## 2014-07-22 VITALS — BP 128/88 | HR 66 | Temp 98.2°F | Ht 67.0 in | Wt 224.0 lb

## 2014-07-22 DIAGNOSIS — E213 Hyperparathyroidism, unspecified: Secondary | ICD-10-CM | POA: Insufficient documentation

## 2014-07-22 DIAGNOSIS — E041 Nontoxic single thyroid nodule: Secondary | ICD-10-CM | POA: Diagnosis not present

## 2014-07-22 DIAGNOSIS — E210 Primary hyperparathyroidism: Secondary | ICD-10-CM | POA: Insufficient documentation

## 2014-07-22 LAB — VITAMIN D 25 HYDROXY (VIT D DEFICIENCY, FRACTURES): VITD: 25.52 ng/mL — AB (ref 30.00–100.00)

## 2014-07-22 NOTE — Patient Instructions (Signed)
blood tests are requested for you today.  We'll let you know about the results. Sometimes we have to follow this over time, to see what the cause is. Let's also check an ultrasound of the thyroid.  you will receive a phone call, about a day and time for an appointment.  If a nodule is found, we can check a biopsy here in the office.  It is easy.

## 2014-07-22 NOTE — Progress Notes (Signed)
Subjective:    Patient ID: Brianna Fletcher, female    DOB: 12/24/1970, 44 y.o.   MRN: [id number]  HPI Pt was noted to have hyperparathyroidism in early 2016.  She has moderate cramps in the legs, but no assoc numbness.  She was noted to have vit-D deficiency, and has stated taking rx for this.  She has never had urolithiasis or bony fracture.   Past Medical History  Diagnosis Date  . Glaucoma   . Arthritis   . Leukocytosis, unspecified 11/15/2012  . Chronic neutrophilia 11/15/2012  . Pruritus 11/15/2012  . Monocytosis 11/15/2012  . Myeloproliferative disorder 03/08/2013    Past Surgical History  Procedure Laterality Date  . Tubal ligation  2000  . Eye surgery      laser eye surgery    History   Social History  . Marital Status: Divorced    Spouse Name: N/A  . Number of Children: N/A  . Years of Education: N/A   Occupational History  . Not on file.   Social History Main Topics  . Smoking status: Never Smoker   . Smokeless tobacco: Never Used     Comment: never used tobacco  . Alcohol Use: No  . Drug Use: No  . Sexual Activity:    Partners: Male   Other Topics Concern  . Not on file   Social History Narrative    Current Outpatient Prescriptions on File Prior to Visit  Medication Sig Dispense Refill  . aspirin 81 MG tablet Take 81 mg by mouth daily.    . cholecalciferol (VITAMIN D) 1000 UNITS tablet Take 2,000 Units by mouth daily.    . clotrimazole-betamethasone (LOTRISONE) cream Apply 1 application topically 2 (two) times daily. 30 g 0  . doxepin (SINEQUAN) 50 MG capsule TAKE 1 CAPSULE BY MOUTH AT BEDTIME 30 capsule 3  . Melatonin 5 MG TABS Take by mouth as needed.    . Omega-3 Fatty Acids (FISH OIL) 1000 MG CAPS Take by mouth every morning.    Marland Kitchen omeprazole (PRILOSEC) 20 MG capsule Take 1 capsule (20 mg total) by mouth daily. 30 capsule 3  . PEGASYS 180 MCG/ML injection   1  . Vitamin D, Ergocalciferol, (DRISDOL) 50000 UNITS CAPS capsule Take 1 capsule (50,000  Units total) by mouth every 7 (seven) days. 4 capsule 3   Current Facility-Administered Medications on File Prior to Visit  Medication Dose Route Frequency Provider Last Rate Last Dose  . acetaminophen (TYLENOL) tablet 650 mg  650 mg Oral Once [name], MD   650 mg at 04/26/14 1516  . peginterferon alfa-2a (PEGASYS) injection 81 mcg  81 mcg Subcutaneous Weekly [name], MD   81 mcg at 04/26/14 1530    No Known Allergies  Family History  Problem Relation Age of Onset  . Heart disease Mother   . Hypertension Mother   . Hyperlipidemia Mother   . Diabetes Father   . Cancer Father     esopageal ?  . Dementia Maternal Grandmother   . Heart disease Maternal Grandmother   . Heart disease Maternal Grandfather 53    MI  . Other Neg Hx     hyperparathyroidism    BP 128/88 mmHg  Pulse 66  Temp(Src) 98.2 F (36.8 C) (Oral)  Ht 5\' 7"  (1.702 m)  Wt 224 lb (101.606 kg)  BMI 35.08 kg/m2  SpO2 98%  LMP 06/21/2014  Review of Systems denies hematuria, seizures, abdominal pain, urinary frequency, skin rash, visual loss,  sob, diarrhea, and rhinorrhea.  She has difficulty losing weight.  She has reg menses.  Depression is much better recently.  She has low-back pain, easy bruising, and right hip pain.       Objective:   Physical Exam VS: see vs page GEN: no distress HEAD: head: no deformity eyes: no periorbital swelling, no proptosis external nose and ears are normal mouth: no lesion seen NECK: 2 cm right thyroid nodule CHEST WALL: no deformity LUNGS:  Clear to auscultation CV: reg rate and rhythm, no murmur ABD: abdomen is soft, nontender.  no hepatosplenomegaly.  not distended.  no hernia MUSCULOSKELETAL: muscle bulk and strength are grossly normal.  no obvious joint swelling.  gait is normal and steady EXTEMITIES: no deformity.  no ulcer on the feet.  feet are of normal color and temp.  no edema PULSES: dorsalis pedis intact bilat.  no carotid bruit NEURO:  cn 2-12  grossly intact.   readily moves all 4's.  sensation is intact to touch on the feet SKIN:  Normal texture and temperature.  No rash or suspicious lesion is visible.   NODES:  None palpable at the neck PSYCH: alert, well-oriented.  Does not appear anxious nor depressed.    Lab Results  Component Value Date   PTH 93* 07/08/2014   CALCIUM 10.5* 07/19/2014   Lab Results  Component Value Date   TSH 1.30 07/01/2014   Thyroid US: multinodular goiter, with dominant right nodule Lab Results  Component Value Date   PTH 125* 07/22/2014   CALCIUM 10.2 07/22/2014  25-OH vit-D=24    Assessment & Plan:  Vit-D deficiency, improved on rx Hyperparathyroidism: she prob has a combination of primary and secondary Thyroid nodule, new.  She needs bx.  Patient is advised the following: Patient Instructions  blood tests are requested for you today.  We'll let you know about the results. Sometimes we have to follow this over time, to see what the cause is. Let's also check an ultrasound of the thyroid.  you will receive a phone call, about a day and time for an appointment.  If a nodule is found, we can check a biopsy here in the office.  It is easy.

## 2014-07-23 ENCOUNTER — Ambulatory Visit (HOSPITAL_BASED_OUTPATIENT_CLINIC_OR_DEPARTMENT_OTHER)
Admission: RE | Admit: 2014-07-23 | Discharge: 2014-07-23 | Disposition: A | Discharge status: Home / Self Care | Payer: BLUE CROSS/BLUE SHIELD | Source: Ambulatory Visit | Attending: Endocrinology | Admitting: Endocrinology

## 2014-07-23 DIAGNOSIS — E042 Nontoxic multinodular goiter: Secondary | ICD-10-CM | POA: Diagnosis not present

## 2014-07-23 DIAGNOSIS — E041 Nontoxic single thyroid nodule: Secondary | ICD-10-CM

## 2014-07-23 DIAGNOSIS — E010 Iodine-deficiency related diffuse (endemic) goiter: Secondary | ICD-10-CM | POA: Insufficient documentation

## 2014-07-23 LAB — PTH, INTACT AND CALCIUM
Calcium: 10.2 mg/dL (ref 8.4–10.5)
PTH: 125 pg/mL — AB (ref 14–64)

## 2014-07-24 ENCOUNTER — Telehealth: Payer: Self-pay | Admitting: Endocrinology

## 2014-07-24 ENCOUNTER — Ambulatory Visit: Payer: BLUE CROSS/BLUE SHIELD | Admitting: Endocrinology

## 2014-07-24 NOTE — Telephone Encounter (Addendum)
Left voice mail advising patient to call back and schedule office visit.  

## 2014-07-24 NOTE — Telephone Encounter (Signed)
Ov this week please 

## 2014-07-24 NOTE — Telephone Encounter (Signed)
See note below and please advise, Thanks! 

## 2014-07-24 NOTE — Telephone Encounter (Signed)
Patient stated she want to get her biopsy right away.

## 2014-07-25 ENCOUNTER — Encounter: Payer: Self-pay | Admitting: Endocrinology

## 2014-07-25 ENCOUNTER — Other Ambulatory Visit: Payer: Self-pay | Admitting: Nurse Practitioner

## 2014-07-25 ENCOUNTER — Ambulatory Visit (INDEPENDENT_AMBULATORY_CARE_PROVIDER_SITE_OTHER): Payer: BLUE CROSS/BLUE SHIELD | Admitting: Endocrinology

## 2014-07-25 ENCOUNTER — Other Ambulatory Visit (HOSPITAL_COMMUNITY)
Admission: RE | Admit: 2014-07-25 | Discharge: 2014-07-25 | Disposition: A | Discharge status: Home / Self Care | Payer: BLUE CROSS/BLUE SHIELD | Source: Ambulatory Visit | Attending: Endocrinology | Admitting: Endocrinology

## 2014-07-25 VITALS — BP 138/80 | HR 66 | Temp 97.9°F | Wt 226.0 lb

## 2014-07-25 DIAGNOSIS — E041 Nontoxic single thyroid nodule: Secondary | ICD-10-CM | POA: Diagnosis not present

## 2014-07-25 DIAGNOSIS — D72828 Other elevated white blood cell count: Secondary | ICD-10-CM

## 2014-07-25 MED ORDER — VITAMIN D (ERGOCALCIFEROL) 1.25 MG (50000 UNIT) PO CAPS
ORAL_CAPSULE | ORAL | Status: DC
Start: 2014-07-25 — End: 2014-10-01

## 2014-07-25 NOTE — Patient Instructions (Addendum)
Please increase the vitamin-D to twice a week. Please come back for a follow-up appointment in 1 month. We'll let you know about the results of the biopsy.

## 2014-07-25 NOTE — Progress Notes (Signed)
   Subjective:    Patient ID: Brianna Fletcher, female    DOB: 04-01-1970, 44 y.o.   MRN: [id number]  HPI She feels no different since she has been on vit-D.  pt states she feels well in general.  She wants to increase ergocalciferol if possible.   Review of Systems No neck pain.    Objective:   Physical Exam VITAL SIGNS:  See vs page GENERAL: no distress Neck: right thyroid nodule is again noted  Vit-D=25   thyroid needle bx: consent obtained, signed form on chart The area is first sprayed with cooling agent local: xylocaine 2%, with epinephrine prep: alcohol pad 4 bxs are done with 25 and 79K needles no complications    Assessment & Plan:  Thyroid nodule, s/p bx Vit-D deficiency, unchanged on supplementation  Patient is advised the following: Patient Instructions  Please increase the vitamin-D to twice a week. Please come back for a follow-up appointment in 1 month. We'll let you know about the results of the biopsy.   Addendum: bx is benign.  i'll see you next time.

## 2014-07-26 ENCOUNTER — Other Ambulatory Visit (HOSPITAL_BASED_OUTPATIENT_CLINIC_OR_DEPARTMENT_OTHER): Payer: BLUE CROSS/BLUE SHIELD

## 2014-07-26 ENCOUNTER — Encounter: Payer: Self-pay | Admitting: Hematology & Oncology

## 2014-07-26 ENCOUNTER — Ambulatory Visit (HOSPITAL_BASED_OUTPATIENT_CLINIC_OR_DEPARTMENT_OTHER): Payer: BLUE CROSS/BLUE SHIELD | Admitting: Hematology & Oncology

## 2014-07-26 VITALS — BP 152/74 | HR 65 | Temp 97.9°F | Resp 14 | Ht 67.0 in | Wt 223.0 lb

## 2014-07-26 DIAGNOSIS — D471 Chronic myeloproliferative disease: Secondary | ICD-10-CM

## 2014-07-26 DIAGNOSIS — D72828 Other elevated white blood cell count: Secondary | ICD-10-CM

## 2014-07-26 LAB — CBC WITH DIFFERENTIAL (CANCER CENTER ONLY)
BASO#: 0 10*3/uL (ref 0.0–0.2)
BASO%: 0.1 % (ref 0.0–2.0)
EOS%: 0.1 % (ref 0.0–7.0)
Eosinophils Absolute: 0 10*3/uL (ref 0.0–0.5)
HEMATOCRIT: 40.3 % (ref 34.8–46.6)
HEMOGLOBIN: 13.3 g/dL (ref 11.6–15.9)
LYMPH#: 2.5 10*3/uL (ref 0.9–3.3)
LYMPH%: 12.6 % — ABNORMAL LOW (ref 14.0–48.0)
MCH: 31.7 pg (ref 26.0–34.0)
MCHC: 33 g/dL (ref 32.0–36.0)
MCV: 96 fL (ref 81–101)
MONO#: 1.2 10*3/uL — AB (ref 0.1–0.9)
MONO%: 6.1 % (ref 0.0–13.0)
NEUT#: 16.2 10*3/uL — ABNORMAL HIGH (ref 1.5–6.5)
NEUT%: 81.1 % — ABNORMAL HIGH (ref 39.6–80.0)
Platelets: 217 10*3/uL (ref 145–400)
RBC: 4.19 10*6/uL (ref 3.70–5.32)
RDW: 14.4 % (ref 11.1–15.7)
WBC: 20 10*3/uL — ABNORMAL HIGH (ref 3.9–10.0)

## 2014-07-26 LAB — COMPREHENSIVE METABOLIC PANEL
ALT: 15 U/L (ref 0–35)
AST: 19 U/L (ref 0–37)
Albumin: 4.6 g/dL (ref 3.5–5.2)
Alkaline Phosphatase: 153 U/L — ABNORMAL HIGH (ref 39–117)
BUN: 13 mg/dL (ref 6–23)
CHLORIDE: 102 meq/L (ref 96–112)
CO2: 22 meq/L (ref 19–32)
CREATININE: 0.63 mg/dL (ref 0.50–1.10)
Calcium: 10.7 mg/dL — ABNORMAL HIGH (ref 8.4–10.5)
GLUCOSE: 90 mg/dL (ref 70–99)
Potassium: 4 mEq/L (ref 3.5–5.3)
Sodium: 137 mEq/L (ref 135–145)
Total Bilirubin: 0.5 mg/dL (ref 0.2–1.2)
Total Protein: 7.8 g/dL (ref 6.0–8.3)

## 2014-07-26 LAB — LACTATE DEHYDROGENASE: LDH: 134 U/L (ref 94–250)

## 2014-07-26 NOTE — Progress Notes (Signed)
Hematology and Oncology Follow Up Visit  Brianna Fletcher [id number] Oct 12, 1970 44 y.o. 07/26/2014   Principle Diagnosis:  Chronic myeloproliferative syndrome-Triple negative  Current Therapy:   Peg-Interferon q weekly     Interim History:  [name] is back for followup. She is tolerating the peg-interferon well. She really looks good. [name] working. She comes in with a female friend. I think this is who she is dating. Let us see that she is enjoying herself.  She is feeling pretty well. She is doing well with the interferon. She really is not itching that much. She is having a good appetite. There's no problems with going to the bathroom. There is no pain area and she's had no bleeding. [name] had no leg swelling.  She recently had a thyroid biopsy. This was done by Dr. Loanne Drilling. I do not have the results back yet. Hopefully this biopsy will this show a follicle that is not malignant.  Overall, her performance status is ECOG 1.   Medications:  Current outpatient prescriptions:  .  aspirin 81 MG tablet, Take 81 mg by mouth daily., Disp: , Rfl:  .  clotrimazole-betamethasone (LOTRISONE) cream, Apply 1 application topically 2 (two) times daily., Disp: 30 g, Rfl: 0 .  doxepin (SINEQUAN) 50 MG capsule, TAKE 1 CAPSULE BY MOUTH AT BEDTIME, Disp: 30 capsule, Rfl: 3 .  Melatonin 5 MG TABS, Take by mouth as needed., Disp: , Rfl:  .  Omega-3 Fatty Acids (FISH OIL) 1000 MG CAPS, Take by mouth every morning., Disp: , Rfl:  .  omeprazole (PRILOSEC) 20 MG capsule, Take 1 capsule (20 mg total) by mouth daily., Disp: 30 capsule, Rfl: 3 .  Vitamin D, Ergocalciferol, (DRISDOL) 50000 UNITS CAPS capsule, 1 pill, twice a week, Disp: 30 capsule, Rfl: 0 .  PEGASYS 180 MCG/ML injection, , Disp: , Rfl: 1 No current facility-administered medications for this visit.  Facility-Administered Medications Ordered in Other Visits:  .  acetaminophen (TYLENOL) tablet 650 mg, 650 mg, Oral, Once, [name], MD, 650  mg at 04/26/14 1516 .  peginterferon alfa-2a (PEGASYS) injection 81 mcg, 81 mcg, Subcutaneous, Weekly, [name], MD, 81 mcg at 04/26/14 1530  Allergies: No Known Allergies  Past Medical History, Surgical history, Social history, and Family History were reviewed and updated.  Review of Systems: As above  Physical Exam:  height is 5\' 7"  (1.702 m) and weight is 223 lb (101.152 kg). Her oral temperature is 97.9 F (36.6 C). Her blood pressure is 152/74 and her pulse is 65. Her respiration is 14.   Well-developed and well-nourished white female. Head and neck exam shows no ocular or oral lesions. She has no palpable cervical or supraclavicular lymph nodes. Lungs are clear. Cardiac exam regular rate and rhythm with no murmurs rubs or bruits. Abdomen is soft. She has good bowel sounds. There is no fluid wave. There is no palpable liver or spleen tip. Back exam shows no tenderness over the spine or ribs. There is some tenderness over the right hip area. This is chronic. She has some slight decreased range of motion of the right hip. Extremities shows no clubbing, cyanosis or edema. Skin exam no rashes, ecchymosis or petechia. Her skin is a little dry. Neurological exam shows no focal neurological deficits.  Lab Results  Component Value Date   WBC 20.0* 07/26/2014   HGB 13.3 07/26/2014   HCT 40.3 07/26/2014   MCV 96 07/26/2014   PLT 217 07/26/2014     Chemistry  Component Value Date/Time   NA 141 07/19/2014 1414   NA 137 07/12/2014 1419   K 4.2 07/19/2014 1414   K 3.9 07/12/2014 1419   CL 104 07/19/2014 1414   CL 102 07/12/2014 1419   CO2 29 07/19/2014 1414   CO2 25 07/12/2014 1419   BUN 13 07/19/2014 1414   BUN 12 07/12/2014 1419   CREATININE 0.8 07/19/2014 1414   CREATININE 0.62 07/12/2014 1419      Component Value Date/Time   CALCIUM 10.2 07/22/2014 1528   CALCIUM 10.5* 07/19/2014 1414   ALKPHOS 137* 07/19/2014 1414   ALKPHOS 137* 07/12/2014 1419   AST 24 07/19/2014  1414   AST 16 07/12/2014 1419   ALT 22 07/19/2014 1414   ALT 14 07/12/2014 1419   BILITOT 0.60 07/19/2014 1414   BILITOT 0.5 07/12/2014 1419         Impression and Plan: Brianna Fletcher is 44 year old female. She has a myeloproliferative syndrome. So far, our workup has been totally negative.  Her white cell count tends to fluctuate. However, symptomatically, she is doing quite well. She is not itching nearly as much.  We will continue her on treatment weekly. At some point, hopefully we can move her treatments out to every 2 weeks.  I will plan to see her back myself in another 4-5 weeks  I spent about 30 minutes with her.   [name], MD 5/6/20164:55 PM

## 2014-08-02 ENCOUNTER — Other Ambulatory Visit (HOSPITAL_BASED_OUTPATIENT_CLINIC_OR_DEPARTMENT_OTHER): Payer: BLUE CROSS/BLUE SHIELD

## 2014-08-02 ENCOUNTER — Ambulatory Visit: Payer: BLUE CROSS/BLUE SHIELD

## 2014-08-02 DIAGNOSIS — D471 Chronic myeloproliferative disease: Secondary | ICD-10-CM

## 2014-08-02 LAB — CBC WITH DIFFERENTIAL (CANCER CENTER ONLY)
BASO#: 0 10*3/uL (ref 0.0–0.2)
BASO%: 0.1 % (ref 0.0–2.0)
EOS%: 0.2 % (ref 0.0–7.0)
Eosinophils Absolute: 0 10*3/uL (ref 0.0–0.5)
HCT: 36.5 % (ref 34.8–46.6)
HGB: 12 g/dL (ref 11.6–15.9)
LYMPH#: 2.6 10*3/uL (ref 0.9–3.3)
LYMPH%: 13.2 % — ABNORMAL LOW (ref 14.0–48.0)
MCH: 32 pg (ref 26.0–34.0)
MCHC: 32.9 g/dL (ref 32.0–36.0)
MCV: 97 fL (ref 81–101)
MONO#: 0.8 10*3/uL (ref 0.1–0.9)
MONO%: 3.9 % (ref 0.0–13.0)
NEUT#: 16.1 10*3/uL — ABNORMAL HIGH (ref 1.5–6.5)
NEUT%: 82.6 % — AB (ref 39.6–80.0)
PLATELETS: 203 10*3/uL (ref 145–400)
RBC: 3.75 10*6/uL (ref 3.70–5.32)
RDW: 14.3 % (ref 11.1–15.7)
WBC: 19.5 10*3/uL — ABNORMAL HIGH (ref 3.9–10.0)

## 2014-08-02 LAB — LACTATE DEHYDROGENASE: LDH: 136 U/L (ref 94–250)

## 2014-08-02 LAB — CHCC SATELLITE - SMEAR

## 2014-08-02 LAB — CMP (CANCER CENTER ONLY)
ALT(SGPT): 16 U/L (ref 10–47)
AST: 20 U/L (ref 11–38)
Albumin: 3.6 g/dL (ref 3.3–5.5)
Alkaline Phosphatase: 132 U/L — ABNORMAL HIGH (ref 26–84)
BUN, Bld: 11 mg/dL (ref 7–22)
CHLORIDE: 105 meq/L (ref 98–108)
CO2: 26 mEq/L (ref 18–33)
Calcium: 9.5 mg/dL (ref 8.0–10.3)
Creat: 0.8 mg/dl (ref 0.6–1.2)
Glucose, Bld: 168 mg/dL — ABNORMAL HIGH (ref 73–118)
POTASSIUM: 3.4 meq/L (ref 3.3–4.7)
Sodium: 138 mEq/L (ref 128–145)
Total Bilirubin: 0.6 mg/dl (ref 0.20–1.60)
Total Protein: 7.4 g/dL (ref 6.4–8.1)

## 2014-08-08 ENCOUNTER — Other Ambulatory Visit: Payer: Self-pay | Admitting: *Deleted

## 2014-08-08 DIAGNOSIS — D72828 Other elevated white blood cell count: Secondary | ICD-10-CM

## 2014-08-09 ENCOUNTER — Other Ambulatory Visit (HOSPITAL_BASED_OUTPATIENT_CLINIC_OR_DEPARTMENT_OTHER): Payer: BLUE CROSS/BLUE SHIELD

## 2014-08-09 ENCOUNTER — Ambulatory Visit: Payer: BLUE CROSS/BLUE SHIELD

## 2014-08-09 DIAGNOSIS — D72828 Other elevated white blood cell count: Secondary | ICD-10-CM

## 2014-08-09 LAB — CBC WITH DIFFERENTIAL (CANCER CENTER ONLY)
BASO#: 0 10*3/uL (ref 0.0–0.2)
BASO%: 0.1 % (ref 0.0–2.0)
EOS%: 0.2 % (ref 0.0–7.0)
Eosinophils Absolute: 0 10*3/uL (ref 0.0–0.5)
HCT: 38.5 % (ref 34.8–46.6)
HGB: 12.8 g/dL (ref 11.6–15.9)
LYMPH#: 2 10*3/uL (ref 0.9–3.3)
LYMPH%: 12.8 % — ABNORMAL LOW (ref 14.0–48.0)
MCH: 32 pg (ref 26.0–34.0)
MCHC: 33.2 g/dL (ref 32.0–36.0)
MCV: 96 fL (ref 81–101)
MONO#: 0.5 10*3/uL (ref 0.1–0.9)
MONO%: 3.3 % (ref 0.0–13.0)
NEUT%: 83.6 % — AB (ref 39.6–80.0)
NEUTROS ABS: 13.2 10*3/uL — AB (ref 1.5–6.5)
Platelets: 226 10*3/uL (ref 145–400)
RBC: 4 10*6/uL (ref 3.70–5.32)
RDW: 14.2 % (ref 11.1–15.7)
WBC: 15.7 10*3/uL — ABNORMAL HIGH (ref 3.9–10.0)

## 2014-08-09 LAB — CMP (CANCER CENTER ONLY)
ALK PHOS: 133 U/L — AB (ref 26–84)
ALT(SGPT): 13 U/L (ref 10–47)
AST: 17 U/L (ref 11–38)
Albumin: 3.7 g/dL (ref 3.3–5.5)
BILIRUBIN TOTAL: 0.8 mg/dL (ref 0.20–1.60)
BUN: 9 mg/dL (ref 7–22)
CO2: 28 meq/L (ref 18–33)
Calcium: 10.5 mg/dL — ABNORMAL HIGH (ref 8.0–10.3)
Chloride: 104 mEq/L (ref 98–108)
Creat: 0.7 mg/dl (ref 0.6–1.2)
Glucose, Bld: 151 mg/dL — ABNORMAL HIGH (ref 73–118)
Potassium: 3.4 mEq/L (ref 3.3–4.7)
Sodium: 139 mEq/L (ref 128–145)
Total Protein: 7.5 g/dL (ref 6.4–8.1)

## 2014-08-15 ENCOUNTER — Other Ambulatory Visit: Payer: Self-pay

## 2014-08-15 DIAGNOSIS — D471 Chronic myeloproliferative disease: Secondary | ICD-10-CM

## 2014-08-16 ENCOUNTER — Ambulatory Visit: Payer: BLUE CROSS/BLUE SHIELD

## 2014-08-16 ENCOUNTER — Other Ambulatory Visit (HOSPITAL_BASED_OUTPATIENT_CLINIC_OR_DEPARTMENT_OTHER): Payer: BLUE CROSS/BLUE SHIELD

## 2014-08-16 DIAGNOSIS — D471 Chronic myeloproliferative disease: Secondary | ICD-10-CM

## 2014-08-16 LAB — CBC WITH DIFFERENTIAL (CANCER CENTER ONLY)
BASO#: 0 10*3/uL (ref 0.0–0.2)
BASO%: 0.1 % (ref 0.0–2.0)
EOS%: 0.2 % (ref 0.0–7.0)
Eosinophils Absolute: 0 10*3/uL (ref 0.0–0.5)
HEMATOCRIT: 39.1 % (ref 34.8–46.6)
HEMOGLOBIN: 12.9 g/dL (ref 11.6–15.9)
LYMPH#: 2.8 10*3/uL (ref 0.9–3.3)
LYMPH%: 14.5 % (ref 14.0–48.0)
MCH: 31.6 pg (ref 26.0–34.0)
MCHC: 33 g/dL (ref 32.0–36.0)
MCV: 96 fL (ref 81–101)
MONO#: 1 10*3/uL — ABNORMAL HIGH (ref 0.1–0.9)
MONO%: 5.1 % (ref 0.0–13.0)
NEUT%: 80.1 % — AB (ref 39.6–80.0)
NEUTROS ABS: 15.2 10*3/uL — AB (ref 1.5–6.5)
PLATELETS: 239 10*3/uL (ref 145–400)
RBC: 4.08 10*6/uL (ref 3.70–5.32)
RDW: 13.8 % (ref 11.1–15.7)
WBC: 19 10*3/uL — ABNORMAL HIGH (ref 3.9–10.0)

## 2014-08-16 LAB — CMP (CANCER CENTER ONLY)
ALK PHOS: 132 U/L — AB (ref 26–84)
ALT: 17 U/L (ref 10–47)
AST: 21 U/L (ref 11–38)
Albumin: 4.3 g/dL (ref 3.3–5.5)
BUN, Bld: 10 mg/dL (ref 7–22)
CO2: 26 mEq/L (ref 18–33)
Calcium: 10.3 mg/dL (ref 8.0–10.3)
Chloride: 103 mEq/L (ref 98–108)
Creat: 0.6 mg/dl (ref 0.6–1.2)
Glucose, Bld: 86 mg/dL (ref 73–118)
Potassium: 3.9 mEq/L (ref 3.3–4.7)
Sodium: 138 mEq/L (ref 128–145)
TOTAL PROTEIN: 7.6 g/dL (ref 6.4–8.1)
Total Bilirubin: 0.8 mg/dl (ref 0.20–1.60)

## 2014-08-20 ENCOUNTER — Ambulatory Visit (INDEPENDENT_AMBULATORY_CARE_PROVIDER_SITE_OTHER): Payer: BLUE CROSS/BLUE SHIELD | Admitting: Family Medicine

## 2014-08-20 ENCOUNTER — Encounter: Payer: Self-pay | Admitting: Family Medicine

## 2014-08-20 VITALS — BP 120/76 | HR 82 | Temp 97.9°F | Ht 67.0 in | Wt 226.4 lb

## 2014-08-20 DIAGNOSIS — Z8249 Family history of ischemic heart disease and other diseases of the circulatory system: Secondary | ICD-10-CM

## 2014-08-20 DIAGNOSIS — R32 Unspecified urinary incontinence: Secondary | ICD-10-CM

## 2014-08-20 DIAGNOSIS — N393 Stress incontinence (female) (male): Secondary | ICD-10-CM | POA: Diagnosis not present

## 2014-08-20 DIAGNOSIS — G2581 Restless legs syndrome: Secondary | ICD-10-CM | POA: Diagnosis not present

## 2014-08-20 DIAGNOSIS — R079 Chest pain, unspecified: Secondary | ICD-10-CM

## 2014-08-20 LAB — TROPONIN I: Troponin I: <0.01 ng/mL (ref <0.06)

## 2014-08-20 MED ORDER — ROPINIROLE HCL 0.25 MG PO TABS: ORAL_TABLET | ORAL | Status: DC

## 2014-08-20 MED ORDER — SOLIFENACIN SUCCINATE 5 MG PO TABS: 5.0000 mg | ORAL_TABLET | Freq: Every day | ORAL | Status: DC

## 2014-08-20 NOTE — Patient Instructions (Signed)

## 2014-08-20 NOTE — Progress Notes (Signed)
Patient ID: Brianna Fletcher, female    DOB: Feb 09, 1971  Age: 44 y.o. MRN: [id number]    Subjective:  Subjective HPI Brianna Fletcher presents c/o chest pain -- started Saturday morning while lying in bed.  It lasted about 30 min if that.  She had 2 episodes since. No sob.  Pt states she felt paralyzed.  Last episode was sat am.  None since that.  Left arm feels weak.  But no other symptoms  Review of Systems  Constitutional: Positive for fatigue. Negative for activity change, appetite change and unexpected weight change.  Respiratory: Negative for cough and shortness of breath.   Cardiovascular: Negative for chest pain and palpitations.  Neurological: Positive for weakness. Negative for dizziness, facial asymmetry, speech difficulty, light-headedness, numbness and headaches.  Psychiatric/Behavioral: Negative for behavioral problems, dysphoric mood and decreased concentration. The patient is not nervous/anxious.     History Past Medical History  Diagnosis Date  . Glaucoma   . Arthritis   . Leukocytosis, unspecified 11/15/2012  . Chronic neutrophilia 11/15/2012  . Pruritus 11/15/2012  . Monocytosis 11/15/2012  . Myeloproliferative disorder 03/08/2013    She has past surgical history that includes Tubal ligation (2000) and Eye surgery.   Her family history includes Cancer in her father; Dementia in her maternal grandmother; Diabetes in her father; Heart disease in her maternal grandmother and mother; Heart disease (age of onset: 72) in her maternal grandfather; Hyperlipidemia in her mother; Hypertension in her mother. There is no history of Other.She reports that she has never smoked. She has never used smokeless tobacco. She reports that she does not drink alcohol or use illicit drugs.  Current Outpatient Prescriptions on File Prior to Visit  Medication Sig Dispense Refill  . aspirin 81 MG tablet Take 81 mg by mouth daily.    . clotrimazole-betamethasone (LOTRISONE) cream Apply 1  application topically 2 (two) times daily. 30 g 0  . doxepin (SINEQUAN) 50 MG capsule TAKE 1 CAPSULE BY MOUTH AT BEDTIME 30 capsule 3  . Melatonin 5 MG TABS Take by mouth as needed.    . Omega-3 Fatty Acids (FISH OIL) 1000 MG CAPS Take by mouth every morning.    Marland Kitchen omeprazole (PRILOSEC) 20 MG capsule Take 1 capsule (20 mg total) by mouth daily. 30 capsule 3  . PEGASYS 180 MCG/ML injection   1  . Vitamin D, Ergocalciferol, (DRISDOL) 50000 UNITS CAPS capsule 1 pill, twice a week 30 capsule 0   Current Facility-Administered Medications on File Prior to Visit  Medication Dose Route Frequency Provider Last Rate Last Dose  . acetaminophen (TYLENOL) tablet 650 mg  650 mg Oral Once [name], MD   650 mg at 04/26/14 1516  . peginterferon alfa-2a (PEGASYS) injection 81 mcg  81 mcg Subcutaneous Weekly [name], MD   81 mcg at 04/26/14 1530     Objective:  Objective Physical Exam  Constitutional: She is oriented to person, place, and time. She appears well-developed and well-nourished.  HENT:  Head: Normocephalic and atraumatic.  Eyes: Conjunctivae and EOM are normal.  Neck: Normal range of motion. Neck supple. No JVD present. Carotid bruit is not present. No thyromegaly present.  Cardiovascular: Normal rate, regular rhythm and normal heart sounds.   No murmur heard. Pulmonary/Chest: Effort normal and breath sounds normal. No respiratory distress. She has no wheezes. She has no rales. She exhibits no tenderness.  Musculoskeletal: She exhibits no edema.  Neurological: She is alert and oriented to person, place, and time.  Psychiatric: She has a normal mood and affect. Her behavior is normal. Thought content normal.   BP 120/76 mmHg  Pulse 82  Temp(Src) 97.9 F (36.6 C) (Oral)  Ht 5\' 7"  (1.702 m)  Wt 226 lb 6.4 oz (102.694 kg)  BMI 35.45 kg/m2  SpO2 98%  LMP 07/15/2014 Wt Readings from Last 3 Encounters:  08/20/14 226 lb 6.4 oz (102.694 kg)  07/26/14 223 lb (101.152 kg)    07/25/14 226 lb (102.513 kg)     Lab Results  Component Value Date   WBC 19.0* 08/16/2014   HGB 12.9 08/16/2014   HCT 39.1 08/16/2014   PLT 239 08/16/2014   GLUCOSE 86 08/16/2014   CHOL 172 07/01/2014   TRIG 82.0 07/01/2014   HDL 46.90 07/01/2014   LDLCALC 109* 07/01/2014   ALT 17 08/16/2014   AST 21 08/16/2014   NA 138 08/16/2014   K 3.9 08/16/2014   CL 103 08/16/2014   CREATININE 0.6 08/16/2014   BUN 10 08/16/2014   CO2 26 08/16/2014   TSH 1.30 07/01/2014   INR 1.00 11/19/2013    No results found.   Assessment & Plan:  Plan I am having Ms. Geathers start on rOPINIRole and solifenacin. I am also having her maintain her doxepin, Fish Oil, aspirin, Melatonin, PEGASYS, clotrimazole-betamethasone, omeprazole, and Vitamin D (Ergocalciferol).  Meds ordered this encounter  Medications  . rOPINIRole (REQUIP) 0.25 MG tablet    Sig: 1 po qhs x 3 days then 2 po qhs for 1 week . May increase to 3 po qhs if needed    Dispense:  60 tablet    Refill:  2  . solifenacin (VESICARE) 5 MG tablet    Sig: Take 1 tablet (5 mg total) by mouth daily.    Dispense:  30 tablet    Refill:  5    Problem List Items Addressed This Visit    None    Visit Diagnoses    Chest pain, unspecified chest pain type    -  Primary    Relevant Orders    EKG 12-Lead (Completed)    Myocardial Perfusion Imaging    ECHOCARDIOGRAM COMPLETE    Basic metabolic panel    CBC with Differential/Platelet    Hepatic function panel    D-Dimer, Quantitative (Completed)    Troponin I (Completed)    Family history of early CAD        Relevant Orders    Myocardial Perfusion Imaging    D-Dimer, Quantitative (Completed)    Troponin I (Completed)    RLS (restless legs syndrome)        Relevant Medications    rOPINIRole (REQUIP) 0.25 MG tablet    Other Relevant Orders    D-Dimer, Quantitative (Completed)    Troponin I (Completed)    Incontinence in female        Relevant Medications    solifenacin (VESICARE)  5 MG tablet    Other Relevant Orders    D-Dimer, Quantitative (Completed)    Troponin I (Completed)       Follow-up: Return if symptoms worsen or fail to improve.  Garnet Koyanagi, DO

## 2014-08-20 NOTE — Progress Notes (Signed)
Pre visit review using our clinic review tool, if applicable. No additional management support is needed unless otherwise documented below in the visit note. 

## 2014-08-21 LAB — BASIC METABOLIC PANEL
BUN: 14 mg/dL (ref 6–23)
CALCIUM: 10.1 mg/dL (ref 8.4–10.5)
CHLORIDE: 100 meq/L (ref 96–112)
CO2: 26 meq/L (ref 19–32)
CREATININE: 0.8 mg/dL (ref 0.40–1.20)
GFR: 81.93 mL/min (ref >60.00)
Glucose, Bld: 127 mg/dL — ABNORMAL HIGH (ref 70–99)
POTASSIUM: 4.2 meq/L (ref 3.5–5.1)
SODIUM: 133 meq/L — AB (ref 135–145)

## 2014-08-21 LAB — HEPATIC FUNCTION PANEL
ALBUMIN: 4.2 g/dL (ref 3.5–5.2)
ALT: 12 U/L (ref 0–35)
AST: 22 U/L (ref 0–37)
Alkaline Phosphatase: 144 U/L — ABNORMAL HIGH (ref 39–117)
BILIRUBIN DIRECT: 0.1 mg/dL (ref 0.0–0.3)
TOTAL PROTEIN: 7.5 g/dL (ref 6.0–8.3)
Total Bilirubin: 0.4 mg/dL (ref 0.2–1.2)

## 2014-08-21 LAB — CBC WITH DIFFERENTIAL/PLATELET
Basophils Absolute: 0.2 10*3/uL — ABNORMAL HIGH (ref 0.0–0.1)
Basophils Relative: 1.1 % (ref 0.0–3.0)
Eosinophils Absolute: 0 10*3/uL (ref 0.0–0.7)
Eosinophils Relative: 0.2 % (ref 0.0–5.0)
HCT: 39.5 % (ref 36.0–46.0)
Hemoglobin: 13.4 g/dL (ref 12.0–15.0)
Lymphocytes Relative: 12.6 % (ref 12.0–46.0)
Lymphs Abs: 1.8 10*3/uL (ref 0.7–4.0)
MCHC: 33.8 g/dL (ref 30.0–36.0)
MCV: 93.8 fl (ref 78.0–100.0)
Monocytes Absolute: 0.6 10*3/uL (ref 0.1–1.0)
Monocytes Relative: 3.8 % (ref 3.0–12.0)
NEUTROS PCT: 82.3 % — AB (ref 43.0–77.0)
Neutro Abs: 12 10*3/uL — ABNORMAL HIGH (ref 1.4–7.7)
Platelets: 189 10*3/uL (ref 150.0–400.0)
RBC: 4.21 Mil/uL (ref 3.87–5.11)
RDW: 13.9 % (ref 11.5–15.5)
WBC: 14.6 10*3/uL — ABNORMAL HIGH (ref 4.0–10.5)

## 2014-08-21 LAB — D-DIMER, QUANTITATIVE: D-Dimer, Quant: 0.27 ug/mL-FEU (ref 0.00–0.48)

## 2014-08-22 ENCOUNTER — Ambulatory Visit: Payer: BLUE CROSS/BLUE SHIELD | Admitting: Internal Medicine

## 2014-08-22 ENCOUNTER — Other Ambulatory Visit: Payer: Self-pay | Admitting: *Deleted

## 2014-08-22 DIAGNOSIS — D72828 Other elevated white blood cell count: Secondary | ICD-10-CM

## 2014-08-23 ENCOUNTER — Other Ambulatory Visit (HOSPITAL_BASED_OUTPATIENT_CLINIC_OR_DEPARTMENT_OTHER): Payer: BLUE CROSS/BLUE SHIELD

## 2014-08-23 ENCOUNTER — Ambulatory Visit: Payer: BLUE CROSS/BLUE SHIELD

## 2014-08-23 ENCOUNTER — Encounter: Payer: Self-pay | Admitting: Hematology & Oncology

## 2014-08-23 ENCOUNTER — Ambulatory Visit (HOSPITAL_BASED_OUTPATIENT_CLINIC_OR_DEPARTMENT_OTHER): Payer: BLUE CROSS/BLUE SHIELD | Admitting: Hematology & Oncology

## 2014-08-23 VITALS — BP 125/67 | HR 57 | Temp 97.9°F | Resp 14 | Ht 67.0 in | Wt 227.0 lb

## 2014-08-23 DIAGNOSIS — D471 Chronic myeloproliferative disease: Secondary | ICD-10-CM

## 2014-08-23 DIAGNOSIS — D72828 Other elevated white blood cell count: Secondary | ICD-10-CM

## 2014-08-23 LAB — CMP (CANCER CENTER ONLY)
ALT: 13 U/L (ref 10–47)
AST: 21 U/L (ref 11–38)
Albumin: 3.7 g/dL (ref 3.3–5.5)
Alkaline Phosphatase: 123 U/L — ABNORMAL HIGH (ref 26–84)
BILIRUBIN TOTAL: 0.7 mg/dL (ref 0.20–1.60)
BUN: 13 mg/dL (ref 7–22)
CHLORIDE: 104 meq/L (ref 98–108)
CO2: 26 meq/L (ref 18–33)
Calcium: 10 mg/dL (ref 8.0–10.3)
Creat: 0.7 mg/dl (ref 0.6–1.2)
Glucose, Bld: 112 mg/dL (ref 73–118)
Potassium: 3.8 mEq/L (ref 3.3–4.7)
Sodium: 136 mEq/L (ref 128–145)
TOTAL PROTEIN: 7.1 g/dL (ref 6.4–8.1)

## 2014-08-23 LAB — CBC WITH DIFFERENTIAL (CANCER CENTER ONLY)
BASO#: 0 10*3/uL (ref 0.0–0.2)
BASO%: 0.1 % (ref 0.0–2.0)
EOS%: 0.2 % (ref 0.0–7.0)
Eosinophils Absolute: 0 10*3/uL (ref 0.0–0.5)
HCT: 37.1 % (ref 34.8–46.6)
HGB: 12.3 g/dL (ref 11.6–15.9)
LYMPH#: 2.4 10*3/uL (ref 0.9–3.3)
LYMPH%: 14.6 % (ref 14.0–48.0)
MCH: 32 pg (ref 26.0–34.0)
MCHC: 33.2 g/dL (ref 32.0–36.0)
MCV: 97 fL (ref 81–101)
MONO#: 0.8 10*3/uL (ref 0.1–0.9)
MONO%: 5 % (ref 0.0–13.0)
NEUT#: 13.3 10*3/uL — ABNORMAL HIGH (ref 1.5–6.5)
NEUT%: 80.1 % — ABNORMAL HIGH (ref 39.6–80.0)
PLATELETS: 180 10*3/uL (ref 145–400)
RBC: 3.84 10*6/uL (ref 3.70–5.32)
RDW: 13.5 % (ref 11.1–15.7)
WBC: 16.7 10*3/uL — ABNORMAL HIGH (ref 3.9–10.0)

## 2014-08-23 NOTE — Progress Notes (Signed)
Hematology and Oncology Follow Up Visit  Brianna Fletcher [id number] 05-19-1970 44 y.o. 08/23/2014   Principle Diagnosis:  Chronic myeloproliferative syndrome-Triple negative  Current Therapy:   Peg-Interferon q weekly     Interim History:  Ms.  Brianna Fletcher is back for followup. She is tolerating the peg-interferon well. She really looks good. [name] working.   She now has a new boyfriend. They're planning on going on a cruise in August. I told her that it will be okay if she missed a treatment of the interferon.  Hopefully, by then, she'll be on every other week interferon.  She is worried about her PTH being elevated. Not sure what this really means as her calcium has not been a problem. She sees her endocrinologist for this.  She is eating okay. [name] not having any problems with nausea or vomiting. [name] had no issues with rashes.  [name] not complaining of any pruritus.  Overall, her performance status is ECOG 1.   Medications:  Current outpatient prescriptions:  .  aspirin 81 MG tablet, Take 81 mg by mouth daily., Disp: , Rfl:  .  clotrimazole-betamethasone (LOTRISONE) cream, Apply 1 application topically 2 (two) times daily., Disp: 30 g, Rfl: 0 .  doxepin (SINEQUAN) 50 MG capsule, TAKE 1 CAPSULE BY MOUTH AT BEDTIME, Disp: 30 capsule, Rfl: 3 .  Melatonin 5 MG TABS, Take by mouth as needed., Disp: , Rfl:  .  Omega-3 Fatty Acids (FISH OIL) 1000 MG CAPS, Take by mouth every morning., Disp: , Rfl:  .  omeprazole (PRILOSEC) 20 MG capsule, Take 1 capsule (20 mg total) by mouth daily., Disp: 30 capsule, Rfl: 3 .  PEGASYS 180 MCG/ML injection, , Disp: , Rfl: 1 .  rOPINIRole (REQUIP) 0.25 MG tablet, 1 po qhs x 3 days then 2 po qhs for 1 week . May increase to 3 po qhs if needed, Disp: 60 tablet, Rfl: 2 .  solifenacin (VESICARE) 5 MG tablet, Take 1 tablet (5 mg total) by mouth daily., Disp: 30 tablet, Rfl: 5 .  Vitamin D, Ergocalciferol, (DRISDOL) 50000 UNITS CAPS capsule, 1 pill, twice a  week, Disp: 30 capsule, Rfl: 0 No current facility-administered medications for this visit.  Facility-Administered Medications Ordered in Other Visits:  .  acetaminophen (TYLENOL) tablet 650 mg, 650 mg, Oral, Once, [name], MD, 650 mg at 04/26/14 1516 .  peginterferon alfa-2a (PEGASYS) injection 81 mcg, 81 mcg, Subcutaneous, Weekly, [name], MD, 81 mcg at 04/26/14 1530  Allergies: No Known Allergies  Past Medical History, Surgical history, Social history, and Family History were reviewed and updated.  Review of Systems: As above  Physical Exam:  height is 5\' 7"  (1.702 m) and weight is 227 lb (102.967 kg). Her oral temperature is 97.9 F (36.6 C). Her blood pressure is 125/67 and her pulse is 57. Her respiration is 14.   Well-developed and well-nourished white female. Head and neck exam shows no ocular or oral lesions. She has no palpable cervical or supraclavicular lymph nodes. Lungs are clear. Cardiac exam regular rate and rhythm with no murmurs rubs or bruits. Abdomen is soft. She has good bowel sounds. There is no fluid wave. There is no palpable liver or spleen tip. Back exam shows no tenderness over the spine or ribs. There is some tenderness over the right hip area. This is chronic. She has some slight decreased range of motion of the right hip. Extremities shows no clubbing, cyanosis or edema. Skin exam no rashes, ecchymosis or petechia.  Her skin is a little dry. Neurological exam shows no focal neurological deficits.  Lab Results  Component Value Date   WBC 16.7* 08/23/2014   HGB 12.3 08/23/2014   HCT 37.1 08/23/2014   MCV 97 08/23/2014   PLT 180 08/23/2014     Chemistry      Component Value Date/Time   NA 136 08/23/2014 1401   NA 133* 08/20/2014 1546   K 3.8 08/23/2014 1401   K 4.2 08/20/2014 1546   CL 104 08/23/2014 1401   CL 100 08/20/2014 1546   CO2 26 08/23/2014 1401   CO2 26 08/20/2014 1546   BUN 13 08/23/2014 1401   BUN 14 08/20/2014 1546    CREATININE 0.7 08/23/2014 1401   CREATININE 0.80 08/20/2014 1546      Component Value Date/Time   CALCIUM 10.0 08/23/2014 1401   CALCIUM 10.1 08/20/2014 1546   ALKPHOS 123* 08/23/2014 1401   ALKPHOS 144* 08/20/2014 1546   AST 21 08/23/2014 1401   AST 22 08/20/2014 1546   ALT 13 08/23/2014 1401   ALT 12 08/20/2014 1546   BILITOT 0.70 08/23/2014 1401   BILITOT 0.4 08/20/2014 1546         Impression and Plan: [name] is 44 year old female. She has a myeloproliferative syndrome. So far, our workup has been totally negative.  Her white cell count tends to fluctuate. However, symptomatically, she is doing quite well. She is not itching nearly as much.  We will continue her on treatment weekly. At some point, hopefully we can move her treatments out to every 2 weeks.  I will plan to see her back myself in another 4-5 weeks  I spent about 30 minutes with her.   [name], MD 6/3/20163:30 PM

## 2014-08-26 ENCOUNTER — Encounter: Payer: Self-pay | Admitting: Endocrinology

## 2014-08-26 ENCOUNTER — Ambulatory Visit (INDEPENDENT_AMBULATORY_CARE_PROVIDER_SITE_OTHER): Payer: BLUE CROSS/BLUE SHIELD | Admitting: Endocrinology

## 2014-08-26 VITALS — BP 132/76 | HR 67 | Temp 97.8°F | Ht 67.0 in | Wt 226.0 lb

## 2014-08-26 DIAGNOSIS — E559 Vitamin D deficiency, unspecified: Secondary | ICD-10-CM | POA: Diagnosis not present

## 2014-08-26 DIAGNOSIS — R252 Cramp and spasm: Secondary | ICD-10-CM | POA: Insufficient documentation

## 2014-08-26 DIAGNOSIS — E213 Hyperparathyroidism, unspecified: Secondary | ICD-10-CM | POA: Diagnosis not present

## 2014-08-26 NOTE — Progress Notes (Signed)
Subjective:    Patient ID: Brianna Fletcher, female    DOB: 20-Mar-1971, 44 y.o.   MRN: [id number]  HPI Pt returns for f/u hyperparathyroidism (dx'ed early 2016; prob a combination of primary and secondary; she was noted to have vit-D deficiency, and has stated taking rx for this; she has never had urolithiasis or bony fracture).  She feels no better since the vit-D was increased.  Specifically, leg cramps persist.   Past Medical History  Diagnosis Date  . Glaucoma   . Arthritis   . Leukocytosis, unspecified 11/15/2012  . Chronic neutrophilia 11/15/2012  . Pruritus 11/15/2012  . Monocytosis 11/15/2012  . Myeloproliferative disorder 03/08/2013    Past Surgical History  Procedure Laterality Date  . Tubal ligation  2000  . Eye surgery      laser eye surgery    History   Social History  . Marital Status: Divorced    Spouse Name: N/A  . Number of Children: N/A  . Years of Education: N/A   Occupational History  . Not on file.   Social History Main Topics  . Smoking status: Never Smoker   . Smokeless tobacco: Never Used     Comment: never used tobacco  . Alcohol Use: No  . Drug Use: No  . Sexual Activity:    Partners: Male   Other Topics Concern  . Not on file   Social History Narrative    Current Outpatient Prescriptions on File Prior to Visit  Medication Sig Dispense Refill  . aspirin 81 MG tablet Take 81 mg by mouth daily.    . clotrimazole-betamethasone (LOTRISONE) cream Apply 1 application topically 2 (two) times daily. 30 g 0  . doxepin (SINEQUAN) 50 MG capsule TAKE 1 CAPSULE BY MOUTH AT BEDTIME 30 capsule 3  . Melatonin 5 MG TABS Take by mouth as needed.    . Omega-3 Fatty Acids (FISH OIL) 1000 MG CAPS Take by mouth every morning.    Marland Kitchen omeprazole (PRILOSEC) 20 MG capsule Take 1 capsule (20 mg total) by mouth daily. 30 capsule 3  . PEGASYS 180 MCG/ML injection   1  . rOPINIRole (REQUIP) 0.25 MG tablet 1 po qhs x 3 days then 2 po qhs for 1 week . May increase to 3  po qhs if needed 60 tablet 2  . solifenacin (VESICARE) 5 MG tablet Take 1 tablet (5 mg total) by mouth daily. 30 tablet 5  . Vitamin D, Ergocalciferol, (DRISDOL) 50000 UNITS CAPS capsule 1 pill, twice a week 30 capsule 0   Current Facility-Administered Medications on File Prior to Visit  Medication Dose Route Frequency Provider Last Rate Last Dose  . acetaminophen (TYLENOL) tablet 650 mg  650 mg Oral Once [name], MD   650 mg at 04/26/14 1516  . peginterferon alfa-2a (PEGASYS) injection 81 mcg  81 mcg Subcutaneous Weekly [name], MD   81 mcg at 04/26/14 1530    No Known Allergies  Family History  Problem Relation Age of Onset  . Heart disease Mother   . Hypertension Mother   . Hyperlipidemia Mother   . Diabetes Father   . Cancer Father     esopageal ?  . Dementia Maternal Grandmother   . Heart disease Maternal Grandmother   . Heart disease Maternal Grandfather 52    MI  . Other Neg Hx     hyperparathyroidism    BP 132/76 mmHg  Pulse 67  Temp(Src) 97.8 F (36.6 C) (Oral)  Ht  5\' 7"  (1.702 m)  Wt 226 lb (102.513 kg)  BMI 35.39 kg/m2  SpO2 98%  Review of Systems Denies numbness.      Objective:   Physical Exam VITAL SIGNS:  See vs page. GENERAL: no distress. Gait: normal and steady. Neuro: sensation is intact to touch on the feet.      Assessment & Plan:  Vit-D deficiency: we'll need to replace this before we can get a good indication of the cause or causes of her hyperparathyroidism.  Patient is advised the following: Patient Instructions  blood tests are requested for you today.  We'll let you know about the results.

## 2014-08-26 NOTE — Patient Instructions (Addendum)
blood tests are requested for you today.  We'll let you know about the results.  

## 2014-08-27 ENCOUNTER — Other Ambulatory Visit: Payer: Self-pay | Admitting: *Deleted

## 2014-08-27 DIAGNOSIS — D72828 Other elevated white blood cell count: Secondary | ICD-10-CM

## 2014-08-27 LAB — VITAMIN D 25 HYDROXY (VIT D DEFICIENCY, FRACTURES): VITD: 38.77 ng/mL (ref 30.00–100.00)

## 2014-08-27 LAB — MAGNESIUM: Magnesium: 2.2 mg/dL (ref 1.5–2.5)

## 2014-08-27 MED ORDER — PEGASYS 180 MCG/ML ~~LOC~~ SOLN: 81.0000 ug | SUBCUTANEOUS | Status: DC

## 2014-08-28 LAB — PTH, INTACT AND CALCIUM
Calcium: 9.8 mg/dL (ref 8.4–10.5)
PTH: 111 pg/mL — ABNORMAL HIGH (ref 14–64)

## 2014-08-29 ENCOUNTER — Other Ambulatory Visit: Payer: Self-pay | Admitting: *Deleted

## 2014-08-29 DIAGNOSIS — D72828 Other elevated white blood cell count: Secondary | ICD-10-CM

## 2014-08-29 MED ORDER — PEGASYS 180 MCG/ML ~~LOC~~ SOLN: 81.0000 ug | SUBCUTANEOUS | Status: DC

## 2014-08-30 ENCOUNTER — Other Ambulatory Visit: Payer: Self-pay | Admitting: *Deleted

## 2014-08-30 ENCOUNTER — Ambulatory Visit: Payer: BLUE CROSS/BLUE SHIELD

## 2014-08-30 ENCOUNTER — Other Ambulatory Visit (HOSPITAL_BASED_OUTPATIENT_CLINIC_OR_DEPARTMENT_OTHER): Payer: BLUE CROSS/BLUE SHIELD

## 2014-08-30 DIAGNOSIS — D72828 Other elevated white blood cell count: Secondary | ICD-10-CM

## 2014-08-30 LAB — CBC WITH DIFFERENTIAL (CANCER CENTER ONLY)
BASO#: 0 10*3/uL (ref 0.0–0.2)
BASO%: 0.1 % (ref 0.0–2.0)
EOS%: 0.2 % (ref 0.0–7.0)
Eosinophils Absolute: 0 10*3/uL (ref 0.0–0.5)
HEMATOCRIT: 39.6 % (ref 34.8–46.6)
HGB: 13.2 g/dL (ref 11.6–15.9)
LYMPH#: 2.9 10*3/uL (ref 0.9–3.3)
LYMPH%: 17.1 % (ref 14.0–48.0)
MCH: 32.1 pg (ref 26.0–34.0)
MCHC: 33.3 g/dL (ref 32.0–36.0)
MCV: 96 fL (ref 81–101)
MONO#: 1 10*3/uL — AB (ref 0.1–0.9)
MONO%: 5.7 % (ref 0.0–13.0)
NEUT#: 12.8 10*3/uL — ABNORMAL HIGH (ref 1.5–6.5)
NEUT%: 76.9 % (ref 39.6–80.0)
Platelets: 213 10*3/uL (ref 145–400)
RBC: 4.11 10*6/uL (ref 3.70–5.32)
RDW: 13.7 % (ref 11.1–15.7)
WBC: 16.6 10*3/uL — AB (ref 3.9–10.0)

## 2014-08-30 LAB — CMP (CANCER CENTER ONLY)
ALK PHOS: 134 U/L — AB (ref 26–84)
ALT: 18 U/L (ref 10–47)
AST: 20 U/L (ref 11–38)
Albumin: 4.1 g/dL (ref 3.3–5.5)
BUN: 11 mg/dL (ref 7–22)
CALCIUM: 10.1 mg/dL (ref 8.0–10.3)
CO2: 27 mEq/L (ref 18–33)
Chloride: 98 mEq/L (ref 98–108)
Creat: 1 mg/dl (ref 0.6–1.2)
Glucose, Bld: 117 mg/dL (ref 73–118)
Potassium: 3.9 mEq/L (ref 3.3–4.7)
SODIUM: 138 meq/L (ref 128–145)
Total Bilirubin: 0.8 mg/dl (ref 0.20–1.60)
Total Protein: 7.5 g/dL (ref 6.4–8.1)

## 2014-09-05 ENCOUNTER — Encounter (HOSPITAL_COMMUNITY): Payer: BLUE CROSS/BLUE SHIELD

## 2014-09-05 ENCOUNTER — Other Ambulatory Visit: Payer: Self-pay | Admitting: *Deleted

## 2014-09-05 ENCOUNTER — Other Ambulatory Visit (HOSPITAL_COMMUNITY): Payer: BLUE CROSS/BLUE SHIELD

## 2014-09-05 DIAGNOSIS — D72828 Other elevated white blood cell count: Secondary | ICD-10-CM

## 2014-09-06 ENCOUNTER — Other Ambulatory Visit (HOSPITAL_BASED_OUTPATIENT_CLINIC_OR_DEPARTMENT_OTHER): Payer: BLUE CROSS/BLUE SHIELD

## 2014-09-06 ENCOUNTER — Ambulatory Visit: Payer: BLUE CROSS/BLUE SHIELD

## 2014-09-06 DIAGNOSIS — D72828 Other elevated white blood cell count: Secondary | ICD-10-CM

## 2014-09-06 DIAGNOSIS — D471 Chronic myeloproliferative disease: Secondary | ICD-10-CM

## 2014-09-06 LAB — CBC WITH DIFFERENTIAL (CANCER CENTER ONLY)
BASO#: 0 10*3/uL (ref 0.0–0.2)
BASO%: 0.1 % (ref 0.0–2.0)
EOS ABS: 0 10*3/uL (ref 0.0–0.5)
EOS%: 0.2 % (ref 0.0–7.0)
HCT: 39.8 % (ref 34.8–46.6)
HGB: 13.4 g/dL (ref 11.6–15.9)
LYMPH#: 2.6 10*3/uL (ref 0.9–3.3)
LYMPH%: 15.3 % (ref 14.0–48.0)
MCH: 32.3 pg (ref 26.0–34.0)
MCHC: 33.7 g/dL (ref 32.0–36.0)
MCV: 96 fL (ref 81–101)
MONO#: 0.9 10*3/uL (ref 0.1–0.9)
MONO%: 5.1 % (ref 0.0–13.0)
NEUT%: 79.3 % (ref 39.6–80.0)
NEUTROS ABS: 13.5 10*3/uL — AB (ref 1.5–6.5)
PLATELETS: 205 10*3/uL (ref 145–400)
RBC: 4.15 10*6/uL (ref 3.70–5.32)
RDW: 13.4 % (ref 11.1–15.7)
WBC: 17.1 10*3/uL — ABNORMAL HIGH (ref 3.9–10.0)

## 2014-09-06 LAB — CMP (CANCER CENTER ONLY)
ALBUMIN: 3.9 g/dL (ref 3.3–5.5)
ALT(SGPT): 18 U/L (ref 10–47)
AST: 21 U/L (ref 11–38)
Alkaline Phosphatase: 135 U/L — ABNORMAL HIGH (ref 26–84)
BILIRUBIN TOTAL: 0.8 mg/dL (ref 0.20–1.60)
BUN, Bld: 12 mg/dL (ref 7–22)
CO2: 28 mEq/L (ref 18–33)
CREATININE: 0.9 mg/dL (ref 0.6–1.2)
Calcium: 10 mg/dL (ref 8.0–10.3)
Chloride: 102 mEq/L (ref 98–108)
Glucose, Bld: 107 mg/dL (ref 73–118)
Potassium: 3.9 mEq/L (ref 3.3–4.7)
Sodium: 139 mEq/L (ref 128–145)
Total Protein: 7.6 g/dL (ref 6.4–8.1)

## 2014-09-09 ENCOUNTER — Ambulatory Visit (INDEPENDENT_AMBULATORY_CARE_PROVIDER_SITE_OTHER): Payer: BLUE CROSS/BLUE SHIELD | Admitting: Family Medicine

## 2014-09-09 ENCOUNTER — Encounter: Payer: Self-pay | Admitting: Family Medicine

## 2014-09-09 VITALS — BP 144/84 | HR 84 | Temp 98.0°F | Wt 226.0 lb

## 2014-09-09 DIAGNOSIS — I10 Essential (primary) hypertension: Secondary | ICD-10-CM | POA: Diagnosis not present

## 2014-09-09 DIAGNOSIS — R079 Chest pain, unspecified: Secondary | ICD-10-CM

## 2014-09-09 DIAGNOSIS — R32 Unspecified urinary incontinence: Secondary | ICD-10-CM

## 2014-09-09 DIAGNOSIS — N393 Stress incontinence (female) (male): Secondary | ICD-10-CM

## 2014-09-09 MED ORDER — MIRABEGRON ER 50 MG PO TB24: 50.0000 mg | ORAL_TABLET | Freq: Every day | ORAL | Status: DC

## 2014-09-09 MED ORDER — LISINOPRIL-HYDROCHLOROTHIAZIDE 10-12.5 MG PO TABS
1.0000 | ORAL_TABLET | Freq: Every day | ORAL | Status: DC
Start: 2014-09-09 — End: 2014-09-13

## 2014-09-09 NOTE — Progress Notes (Signed)
Pre visit review using our clinic review tool, if applicable. No additional management support is needed unless otherwise documented below in the visit note. 

## 2014-09-09 NOTE — Patient Instructions (Signed)

## 2014-09-09 NOTE — Progress Notes (Signed)
Patient ID: Brianna Fletcher, female    DOB: Jul 13, 1970  Age: 44 y.o. MRN: [id number]    Subjective:  Subjective HPI Brianna Fletcher presents for f/u bp , chest pain, dizzy and headaches.  She had to reschedule stress and echo secondary to death in family.  Review of Systems  Constitutional: Negative for activity change, appetite change, fatigue and unexpected weight change.  Respiratory: Negative for cough, chest tightness and shortness of breath.   Cardiovascular: Positive for chest pain. Negative for palpitations and leg swelling.  Neurological: Positive for dizziness and headaches. Negative for facial asymmetry, speech difficulty, weakness and numbness.  Psychiatric/Behavioral: Negative for behavioral problems and dysphoric mood. The patient is not nervous/anxious.     History Past Medical History  Diagnosis Date  . Glaucoma   . Arthritis   . Leukocytosis, unspecified 11/15/2012  . Chronic neutrophilia 11/15/2012  . Pruritus 11/15/2012  . Monocytosis 11/15/2012  . Myeloproliferative disorder 03/08/2013    She has past surgical history that includes Tubal ligation (2000) and Eye surgery.   Her family history includes Cancer in her father; Dementia in her maternal grandmother; Diabetes in her father; Heart disease in her cousin, maternal grandmother, and mother; Heart disease (age of onset: 27) in her maternal grandfather; Hyperlipidemia in her mother; Hypertension in her mother. There is no history of Other.She reports that she has never smoked. She has never used smokeless tobacco. She reports that she does not drink alcohol or use illicit drugs.  Current Outpatient Prescriptions on File Prior to Visit  Medication Sig Dispense Refill  . aspirin 81 MG tablet Take 81 mg by mouth daily.    . clotrimazole-betamethasone (LOTRISONE) cream Apply 1 application topically 2 (two) times daily. 30 g 0  . doxepin (SINEQUAN) 50 MG capsule TAKE 1 CAPSULE BY MOUTH AT BEDTIME 30 capsule 3  .  Melatonin 5 MG TABS Take by mouth as needed.    . Omega-3 Fatty Acids (FISH OIL) 1000 MG CAPS Take by mouth every morning.    Marland Kitchen omeprazole (PRILOSEC) 20 MG capsule Take 1 capsule (20 mg total) by mouth daily. 30 capsule 3  . PEGASYS 180 MCG/ML injection Inject 0.45 mLs (81 mcg total) into the skin every 7 (seven) days. 4 mL 3  . rOPINIRole (REQUIP) 0.25 MG tablet 1 po qhs x 3 days then 2 po qhs for 1 week . May increase to 3 po qhs if needed 60 tablet 2  . Vitamin D, Ergocalciferol, (DRISDOL) 50000 UNITS CAPS capsule 1 pill, twice a week 30 capsule 0   Current Facility-Administered Medications on File Prior to Visit  Medication Dose Route Frequency Provider Last Rate Last Dose  . acetaminophen (TYLENOL) tablet 650 mg  650 mg Oral Once Brianna Napoleon, MD   650 mg at 04/26/14 1516  . peginterferon alfa-2a (PEGASYS) injection 81 mcg  81 mcg Subcutaneous Weekly Brianna Napoleon, MD   81 mcg at 04/26/14 1530     Objective:  Objective Physical Exam  Constitutional: She is oriented to person, place, and time. She appears well-developed and well-nourished. No distress.  HENT:  Right Ear: External ear normal.  Left Ear: External ear normal.  Nose: Nose normal.  Mouth/Throat: Oropharynx is clear and moist.  Eyes: EOM are normal. Pupils are equal, round, and reactive to light.  Neck: Normal range of motion. Neck supple.  Cardiovascular: Normal rate, regular rhythm and normal heart sounds.   No murmur heard. Pulmonary/Chest: Effort normal and breath sounds normal. No  respiratory distress. She has no wheezes. She has no rales. She exhibits no tenderness.  Neurological: She is alert and oriented to person, place, and time.  Psychiatric: She has a normal mood and affect. Her behavior is normal. Judgment and thought content normal.   BP 144/84 mmHg  Pulse 84  Temp(Src) 98 F (36.7 C) (Oral)  Wt 226 lb (102.513 kg)  SpO2 98% Wt Readings from Last 3 Encounters:  09/09/14 226 lb (102.513 kg)    08/26/14 226 lb (102.513 kg)  08/23/14 227 lb (102.967 kg)     Lab Results  Component Value Date   WBC 17.1* 09/06/2014   HGB 13.4 09/06/2014   HCT 39.8 09/06/2014   PLT 205 09/06/2014   GLUCOSE 107 09/06/2014   CHOL 172 07/01/2014   TRIG 82.0 07/01/2014   HDL 46.90 07/01/2014   LDLCALC 109* 07/01/2014   ALT 18 09/06/2014   AST 21 09/06/2014   NA 139 09/06/2014   K 3.9 09/06/2014   CL 102 09/06/2014   CREATININE 0.9 09/06/2014   BUN 12 09/06/2014   CO2 28 09/06/2014   TSH 1.30 07/01/2014   INR 1.00 11/19/2013    No results found.   Assessment & Plan:  Plan I have discontinued [name]'s solifenacin. I am also having her start on lisinopril-hydrochlorothiazide and mirabegron ER. Additionally, I am having her maintain her doxepin, Fish Oil, aspirin, Melatonin, clotrimazole-betamethasone, omeprazole, Vitamin D (Ergocalciferol), rOPINIRole, and PEGASYS.  Meds ordered this encounter  Medications  . lisinopril-hydrochlorothiazide (PRINZIDE,ZESTORETIC) 10-12.5 MG per tablet    Sig: Take 1 tablet by mouth daily.    Dispense:  30 tablet    Refill:  1  . mirabegron ER (MYRBETRIQ) 50 MG TB24 tablet    Sig: Take 1 tablet (50 mg total) by mouth daily.    Dispense:  30 tablet    Refill:  5    Problem List Items Addressed This Visit    None    Visit Diagnoses    Essential hypertension    -  Primary    Relevant Medications    lisinopril-hydrochlorothiazide (PRINZIDE,ZESTORETIC) 10-12.5 MG per tablet    Incontinence in female        Relevant Medications    mirabegron ER (MYRBETRIQ) 50 MG TB24 tablet    Chest pain, unspecified chest pain type        Relevant Orders    EKG 12-Lead (Completed)     stress and echo rescheduled until end of month  Follow-up: Return in about 2 weeks (around 09/23/2014), or if symptoms worsen or fail to improve, for bp check.  Garnet Koyanagi, DO

## 2014-09-12 ENCOUNTER — Other Ambulatory Visit: Payer: Self-pay | Admitting: Nurse Practitioner

## 2014-09-12 DIAGNOSIS — D471 Chronic myeloproliferative disease: Secondary | ICD-10-CM

## 2014-09-13 ENCOUNTER — Other Ambulatory Visit (HOSPITAL_BASED_OUTPATIENT_CLINIC_OR_DEPARTMENT_OTHER): Payer: BLUE CROSS/BLUE SHIELD

## 2014-09-13 ENCOUNTER — Other Ambulatory Visit: Payer: Self-pay

## 2014-09-13 ENCOUNTER — Encounter: Payer: Self-pay | Admitting: *Deleted

## 2014-09-13 ENCOUNTER — Ambulatory Visit: Payer: BLUE CROSS/BLUE SHIELD

## 2014-09-13 DIAGNOSIS — I10 Essential (primary) hypertension: Secondary | ICD-10-CM

## 2014-09-13 DIAGNOSIS — D471 Chronic myeloproliferative disease: Secondary | ICD-10-CM | POA: Diagnosis not present

## 2014-09-13 LAB — COMPREHENSIVE METABOLIC PANEL
ALBUMIN: 4.2 g/dL (ref 3.5–5.2)
ALT: 12 U/L (ref 0–35)
AST: 15 U/L (ref 0–37)
Alkaline Phosphatase: 162 U/L — ABNORMAL HIGH (ref 39–117)
BUN: 13 mg/dL (ref 6–23)
CALCIUM: 10.3 mg/dL (ref 8.4–10.5)
CO2: 27 meq/L (ref 19–32)
CREATININE: 0.71 mg/dL (ref 0.50–1.10)
Chloride: 101 mEq/L (ref 96–112)
GLUCOSE: 122 mg/dL — AB (ref 70–99)
POTASSIUM: 3.9 meq/L (ref 3.5–5.3)
Sodium: 139 mEq/L (ref 135–145)
Total Bilirubin: 0.5 mg/dL (ref 0.2–1.2)
Total Protein: 7.3 g/dL (ref 6.0–8.3)

## 2014-09-13 LAB — CBC WITH DIFFERENTIAL (CANCER CENTER ONLY)
BASO#: 0 10*3/uL (ref 0.0–0.2)
BASO%: 0.1 % (ref 0.0–2.0)
EOS%: 0.3 % (ref 0.0–7.0)
Eosinophils Absolute: 0.1 10*3/uL (ref 0.0–0.5)
HEMATOCRIT: 39.6 % (ref 34.8–46.6)
HGB: 13.2 g/dL (ref 11.6–15.9)
LYMPH#: 2.5 10*3/uL (ref 0.9–3.3)
LYMPH%: 13.8 % — ABNORMAL LOW (ref 14.0–48.0)
MCH: 31.8 pg (ref 26.0–34.0)
MCHC: 33.3 g/dL (ref 32.0–36.0)
MCV: 95 fL (ref 81–101)
MONO#: 0.8 10*3/uL (ref 0.1–0.9)
MONO%: 4.7 % (ref 0.0–13.0)
NEUT#: 14.4 10*3/uL — ABNORMAL HIGH (ref 1.5–6.5)
NEUT%: 81.1 % — AB (ref 39.6–80.0)
PLATELETS: 204 10*3/uL (ref 145–400)
RBC: 4.15 10*6/uL (ref 3.70–5.32)
RDW: 13.2 % (ref 11.1–15.7)
WBC: 17.8 10*3/uL — ABNORMAL HIGH (ref 3.9–10.0)

## 2014-09-13 MED ORDER — LISINOPRIL-HYDROCHLOROTHIAZIDE 10-12.5 MG PO TABS: 1.0000 | ORAL_TABLET | Freq: Every day | ORAL | Status: DC

## 2014-09-13 NOTE — Progress Notes (Unsigned)
Patient and significant other came into office today for reinforcement education on self administering her pegasys. Reviewed skin cleansing, drawing up appropriate amount of medication, self administration technique, injection technique for her significant other and sharps disposal. Answered all questions. Patient performed injection on her own with minimal guidance.

## 2014-09-16 ENCOUNTER — Telehealth (HOSPITAL_COMMUNITY): Payer: Self-pay | Admitting: *Deleted

## 2014-09-16 NOTE — Telephone Encounter (Signed)
Left message on voicemail in reference to upcoming appointment scheduled for 09/18/14. Phone number given for a call back so details instructions can be given. Zora Glendenning J Kalissa Grays, RN  

## 2014-09-18 ENCOUNTER — Ambulatory Visit (HOSPITAL_COMMUNITY): Payer: BLUE CROSS/BLUE SHIELD | Attending: Cardiovascular Disease

## 2014-09-18 ENCOUNTER — Other Ambulatory Visit: Payer: Self-pay

## 2014-09-18 ENCOUNTER — Ambulatory Visit (HOSPITAL_BASED_OUTPATIENT_CLINIC_OR_DEPARTMENT_OTHER): Payer: BLUE CROSS/BLUE SHIELD

## 2014-09-18 DIAGNOSIS — R9439 Abnormal result of other cardiovascular function study: Secondary | ICD-10-CM | POA: Insufficient documentation

## 2014-09-18 DIAGNOSIS — L299 Pruritus, unspecified: Secondary | ICD-10-CM | POA: Insufficient documentation

## 2014-09-18 DIAGNOSIS — R5383 Other fatigue: Secondary | ICD-10-CM | POA: Diagnosis not present

## 2014-09-18 DIAGNOSIS — C946 Myelodysplastic disease, not classified: Secondary | ICD-10-CM | POA: Insufficient documentation

## 2014-09-18 DIAGNOSIS — E213 Hyperparathyroidism, unspecified: Secondary | ICD-10-CM | POA: Insufficient documentation

## 2014-09-18 DIAGNOSIS — R079 Chest pain, unspecified: Secondary | ICD-10-CM

## 2014-09-18 DIAGNOSIS — Z8249 Family history of ischemic heart disease and other diseases of the circulatory system: Secondary | ICD-10-CM | POA: Diagnosis not present

## 2014-09-18 DIAGNOSIS — R0609 Other forms of dyspnea: Secondary | ICD-10-CM | POA: Diagnosis not present

## 2014-09-18 DIAGNOSIS — E559 Vitamin D deficiency, unspecified: Secondary | ICD-10-CM | POA: Insufficient documentation

## 2014-09-18 LAB — MYOCARDIAL PERFUSION IMAGING
CHL CUP NUCLEAR SDS: 5
CHL CUP NUCLEAR SRS: 0
CHL CUP NUCLEAR SSS: 5
CHL CUP RESTING HR STRESS: 58 {beats}/min
LHR: 0.26
LV sys vol: 58 mL
LVDIAVOL: 123 mL
NUC STRESS TID: 1.2
Peak HR: 82 {beats}/min

## 2014-09-18 MED ORDER — REGADENOSON 0.4 MG/5ML IV SOLN
0.4000 mg | Freq: Once | INTRAVENOUS | Status: AC
  Administered 2014-09-18: 0.4 mg via INTRAVENOUS

## 2014-09-18 MED ORDER — TECHNETIUM TC 99M SESTAMIBI GENERIC - CARDIOLITE
9.8000 | Freq: Once | INTRAVENOUS | Status: AC | PRN
  Administered 2014-09-18: 10 via INTRAVENOUS

## 2014-09-18 MED ORDER — TECHNETIUM TC 99M SESTAMIBI GENERIC - CARDIOLITE
32.1000 | Freq: Once | INTRAVENOUS | Status: AC | PRN
  Administered 2014-09-18: 32.1 via INTRAVENOUS

## 2014-09-20 ENCOUNTER — Ambulatory Visit: Payer: BLUE CROSS/BLUE SHIELD

## 2014-09-20 ENCOUNTER — Other Ambulatory Visit (HOSPITAL_BASED_OUTPATIENT_CLINIC_OR_DEPARTMENT_OTHER): Payer: BLUE CROSS/BLUE SHIELD

## 2014-09-20 DIAGNOSIS — D471 Chronic myeloproliferative disease: Secondary | ICD-10-CM

## 2014-09-20 DIAGNOSIS — C946 Myelodysplastic disease, not classified: Secondary | ICD-10-CM

## 2014-09-20 LAB — COMPREHENSIVE METABOLIC PANEL
ALBUMIN: 4.3 g/dL (ref 3.5–5.2)
ALT: 15 U/L (ref 0–35)
AST: 21 U/L (ref 0–37)
Alkaline Phosphatase: 166 U/L — ABNORMAL HIGH (ref 39–117)
BILIRUBIN TOTAL: 0.5 mg/dL (ref 0.2–1.2)
BUN: 14 mg/dL (ref 6–23)
CO2: 23 mEq/L (ref 19–32)
Calcium: 10.2 mg/dL (ref 8.4–10.5)
Chloride: 100 mEq/L (ref 96–112)
Creatinine, Ser: 0.69 mg/dL (ref 0.50–1.10)
GLUCOSE: 83 mg/dL (ref 70–99)
POTASSIUM: 4 meq/L (ref 3.5–5.3)
SODIUM: 136 meq/L (ref 135–145)
Total Protein: 7.4 g/dL (ref 6.0–8.3)

## 2014-09-20 LAB — CBC WITH DIFFERENTIAL (CANCER CENTER ONLY)
BASO#: 0 10*3/uL (ref 0.0–0.2)
BASO%: 0.1 % (ref 0.0–2.0)
EOS%: 0.3 % (ref 0.0–7.0)
Eosinophils Absolute: 0.1 10*3/uL (ref 0.0–0.5)
HCT: 38.2 % (ref 34.8–46.6)
HGB: 12.6 g/dL (ref 11.6–15.9)
LYMPH#: 2.6 10*3/uL (ref 0.9–3.3)
LYMPH%: 13.1 % — AB (ref 14.0–48.0)
MCH: 31.7 pg (ref 26.0–34.0)
MCHC: 33 g/dL (ref 32.0–36.0)
MCV: 96 fL (ref 81–101)
MONO#: 0.9 10*3/uL (ref 0.1–0.9)
MONO%: 4.5 % (ref 0.0–13.0)
NEUT#: 15.9 10*3/uL — ABNORMAL HIGH (ref 1.5–6.5)
NEUT%: 82 % — ABNORMAL HIGH (ref 39.6–80.0)
Platelets: 218 10*3/uL (ref 145–400)
RBC: 3.98 10*6/uL (ref 3.70–5.32)
RDW: 13.2 % (ref 11.1–15.7)
WBC: 19.4 10*3/uL — AB (ref 3.9–10.0)

## 2014-09-24 ENCOUNTER — Telehealth: Payer: Self-pay | Admitting: Family Medicine

## 2014-09-24 DIAGNOSIS — R9439 Abnormal result of other cardiovascular function study: Secondary | ICD-10-CM

## 2014-09-24 NOTE — Telephone Encounter (Signed)
Echocardiogram essentially normal,  (Left  atrium was moderately to severely dilated---> needs to be assessed by cardiology)

## 2014-09-24 NOTE — Telephone Encounter (Signed)
Did you take a look at the ultrasound as well?

## 2014-09-24 NOTE — Telephone Encounter (Signed)
Which test? LM for patient to call

## 2014-09-24 NOTE — Telephone Encounter (Signed)
Advise patient, I saw the results of for stress test, it is equivocal, they are recommending additional imaging. Please arrange a cardiology referral, DX equivocal stress test ER if symptoms severe

## 2014-09-24 NOTE — Telephone Encounter (Signed)
Caller name: Gweneth Relationship to patient: Self Can be reached: [phone] Pharmacy:  Reason for call: would like the results of her tests

## 2014-09-24 NOTE — Telephone Encounter (Signed)
Patient is anxious for Imaging Study results. Please review so that I may respond to patient sooner than Thursday.

## 2014-09-25 ENCOUNTER — Telehealth: Payer: Self-pay

## 2014-09-25 NOTE — Telephone Encounter (Signed)
Lm for patient, that we are awaiting response.

## 2014-09-25 NOTE — Telephone Encounter (Signed)
Since pt is scheduled for D&C in addition to ablation and it is scheduled at [facility], there may be anesthesia involved.  Based on the equivocal stress test, I will forward this to the Cardiologist who read the study and ask her opinion as to whether pt needs to postpone her procedure.

## 2014-09-25 NOTE — Telephone Encounter (Signed)
Having the procedure at [facility].

## 2014-09-25 NOTE — Telephone Encounter (Signed)
Ok to proceed w/ uterine ablation b/c it's an in-office procedure that doesn't require anesthesia.  Agree that she needs cards work up but no need to hold off on ablation

## 2014-09-25 NOTE — Telephone Encounter (Signed)
Spoke with patient and advised per recommendations. Cardio referral entered. Patient aware.

## 2014-09-25 NOTE — Telephone Encounter (Signed)
Patient called back in regards to Stress Test Results (abnormal/equivocal)  and a scheduled Uterine Ablation.   Should patient postpone until cardiac appt?  Please advise.

## 2014-09-26 ENCOUNTER — Other Ambulatory Visit (HOSPITAL_BASED_OUTPATIENT_CLINIC_OR_DEPARTMENT_OTHER): Payer: BLUE CROSS/BLUE SHIELD

## 2014-09-26 ENCOUNTER — Telehealth: Payer: Self-pay | Admitting: Cardiology

## 2014-09-26 DIAGNOSIS — D469 Myelodysplastic syndrome, unspecified: Secondary | ICD-10-CM | POA: Diagnosis not present

## 2014-09-26 DIAGNOSIS — D471 Chronic myeloproliferative disease: Secondary | ICD-10-CM

## 2014-09-26 LAB — CBC WITH DIFFERENTIAL (CANCER CENTER ONLY)
BASO#: 0 10*3/uL (ref 0.0–0.2)
BASO%: 0.1 % (ref 0.0–2.0)
EOS%: 0.2 % (ref 0.0–7.0)
Eosinophils Absolute: 0 10*3/uL (ref 0.0–0.5)
HEMATOCRIT: 38.4 % (ref 34.8–46.6)
HEMOGLOBIN: 12.9 g/dL (ref 11.6–15.9)
LYMPH#: 2.2 10*3/uL (ref 0.9–3.3)
LYMPH%: 14.1 % (ref 14.0–48.0)
MCH: 32.3 pg (ref 26.0–34.0)
MCHC: 33.6 g/dL (ref 32.0–36.0)
MCV: 96 fL (ref 81–101)
MONO#: 1 10*3/uL — ABNORMAL HIGH (ref 0.1–0.9)
MONO%: 6.1 % (ref 0.0–13.0)
NEUT%: 79.5 % (ref 39.6–80.0)
NEUTROS ABS: 12.4 10*3/uL — AB (ref 1.5–6.5)
Platelets: 212 10*3/uL (ref 145–400)
RBC: 4 10*6/uL (ref 3.70–5.32)
RDW: 13.3 % (ref 11.1–15.7)
WBC: 15.7 10*3/uL — ABNORMAL HIGH (ref 3.9–10.0)

## 2014-09-26 LAB — COMPREHENSIVE METABOLIC PANEL
ALK PHOS: 150 U/L — AB (ref 39–117)
ALT: 14 U/L (ref 0–35)
AST: 19 U/L (ref 0–37)
Albumin: 4.3 g/dL (ref 3.5–5.2)
BILIRUBIN TOTAL: 0.4 mg/dL (ref 0.2–1.2)
BUN: 12 mg/dL (ref 6–23)
CO2: 26 mEq/L (ref 19–32)
Calcium: 10.2 mg/dL (ref 8.4–10.5)
Chloride: 103 mEq/L (ref 96–112)
Creatinine, Ser: 0.67 mg/dL (ref 0.50–1.10)
GLUCOSE: 107 mg/dL — AB (ref 70–99)
Potassium: 4.1 mEq/L (ref 3.5–5.3)
Sodium: 140 mEq/L (ref 135–145)
Total Protein: 7.4 g/dL (ref 6.0–8.3)

## 2014-09-26 NOTE — Telephone Encounter (Signed)
Dr. Darden Dates need to bring you into the loop on this patient.  The patient is scheduled for surgery at [facility] next week. Dr Larose Kells reviewed the imaging studies recommeding and I brought Dr Birdie Riddle to help make the determination if she should postpone the surgery.  Dr T reached out to the cardiologist that reviewed the study but have been unable to get a reply from the cardiologist.   Please advise recommendations as the patient needs to determine if she should move forward.

## 2014-09-26 NOTE — Telephone Encounter (Signed)
Brianna Fletcher from Cardiology.  She stated that patient has never been seen by cardiology and will definitely need an appointment before she can be cleared for surgery.

## 2014-09-26 NOTE — Telephone Encounter (Signed)
Agree with pt referral to cardiology

## 2014-09-26 NOTE — Telephone Encounter (Signed)
Left message for patient that we are calling cardiology to get her seen as soon as possible.  Per Dr Etter Sjogren, she would recommend patient being checked by cardiologist before surgery.

## 2014-09-26 NOTE — Telephone Encounter (Signed)
Spoke with Ashlee at [facility] and informed them that the pt has never been established with a cardiologist according to her chart.  Informed Ashlee that typically when a pt needs cardiac/medication clearance, they will need to be scheduled for a new patient appt with cardiology for assessment and further cardiac work-up for clearance.  Informed the Ashlee RN that this is not an established Dr Meda Coffee pt, even though the stress test was read by her. Advised the Ashlee that the safest way to clear a pt from a cardiac perspective is to have them scheduled for an appt with cardiology, then a decision for surgical clearance can be determined after work-up completed. Per Mariane Duval RN, she did not realize the pt has never seen a cardiologist before.  Informed the scheduler also that Dr Meda Coffee is out of the office for the next week and half, for that might be the reason they were never able to make contact with her.  Highly advised Ashlee RN that the safest way to have the pt cleared for her uterine ablation, is to have cardiology see her 1st, work the pt up, and then provide the clearance. Ashlee RN verbalized understanding and stated that she was offered to schedule an appt with cardiology with availabilities at its earliest for next Friday 7/15 in [place], or next available slot in [place] is Aug 8th.  Ashlee RN agrees with arrangement for referral to cardiology for new pt appt for surgical clearance. Will update Dr Meda Coffee of this as well.

## 2014-09-26 NOTE — Telephone Encounter (Signed)
The earliest Cardiology can see the patient is next Friday 7/15 in [place]. The next available slot in [place] is Aug 8th.

## 2014-09-26 NOTE — Telephone Encounter (Signed)
New message     Request for surgical clearance:  1. What type of surgery is being performed? Uterine ablasion  When is this surgery scheduled? 10-04-14 Are there any medications that need to be held prior to surgery and how long?no----need clearance based on stress test Dr Meda Coffee read 2. Name of physician performing surgery?  Dr Linda Hedges  3. What is your office phone and fax number?  Fax to Dr Etter Sjogren office is [phone] 4. DR Festus Holts office is calling because they said based on the stress test report---it was not clear if pt is cleared for procedure.  They sent her here to have stress test

## 2014-09-27 ENCOUNTER — Ambulatory Visit: Payer: BLUE CROSS/BLUE SHIELD

## 2014-09-27 ENCOUNTER — Other Ambulatory Visit: Payer: BLUE CROSS/BLUE SHIELD

## 2014-09-27 NOTE — Telephone Encounter (Signed)
Spoke with patient who states that she was in to see Dr Marin Olp yesterday and worked to get her seen at [facility] next week. [place] appt has been cancelled. Patient states that she is still going to postpone the surgery because she feels to rushed getting this all pulled together. States she has not felt herself since the first episode that lead to the stress test, so she is going one day at a time. Has appt with PCP on Monday, confirms will be here.

## 2014-09-29 NOTE — H&P (Signed)
Brianna Fletcher is an 44 y.o. female with long history of heavy menstrual bleeding.  She has never required blood transfusion but has bled down to Hgb 6.  Patient declines hormonal therapy as well as hysterectomy.  Significant PMH of myeloproliferative disorder for which she is given interferon.    Pertinent Gynecological History: Menses: flow is excessive with use of 8 pads or tampons on heaviest days Bleeding: dysfunctional uterine bleeding Contraception: tubal ligation DES exposure: unknown Blood transfusions: none Sexually transmitted diseases: no past history Previous GYN Procedures: BTL  Last mammogram: normal Date: 02/2014 Last pap: normal Date: 06/2014 OB History: G2, P2   Menstrual History: Menarche age: n/a No LMP recorded.    Past Medical History  Diagnosis Date  . Glaucoma   . Arthritis   . Leukocytosis, unspecified 11/15/2012  . Chronic neutrophilia 11/15/2012  . Pruritus 11/15/2012  . Monocytosis 11/15/2012  . Myeloproliferative disorder 03/08/2013    Past Surgical History  Procedure Laterality Date  . Tubal ligation  2000  . Eye surgery      laser eye surgery    Family History  Problem Relation Age of Onset  . Heart disease Mother   . Hypertension Mother   . Hyperlipidemia Mother   . Diabetes Father   . Cancer Father     esopageal ?  . Dementia Maternal Grandmother   . Heart disease Maternal Grandmother   . Heart disease Maternal Grandfather 2    MI  . Other Neg Hx     hyperparathyroidism  . Heart disease Cousin     Social History:  reports that she has never smoked. She has never used smokeless tobacco. She reports that she does not drink alcohol or use illicit drugs.  Allergies: No Known Allergies  No prescriptions prior to admission    ROS  There were no vitals taken for this visit. Physical Exam  Constitutional: She is oriented to person, place, and time. She appears well-developed and well-nourished.  GI: Soft. There is no rebound and  no guarding.  Neurological: She is alert and oriented to person, place, and time.  Skin: Skin is warm and dry.  Psychiatric: She has a normal mood and affect. Her behavior is normal.    No results found for this or any previous visit (from the past 24 hour(s)).  No results found.  Assessment/Plan: 44yo G2P2 with heavy menstrual bleeding -H/S, D&C, Novasure -Counseled re: risk of bleeding, infection, scarring and damage to surrounding structures.  She also understands risk of uterine sealing which could obscure occult endometrial malignancy in the future.  She also understands the potential need for hysterectomy in the future.  All questions were answered and the patient wishes to proceed.  Brianna Fletcher, [place] 09/29/2014, 7:32 PM

## 2014-09-30 ENCOUNTER — Ambulatory Visit: Payer: BLUE CROSS/BLUE SHIELD | Admitting: Family Medicine

## 2014-10-01 ENCOUNTER — Ambulatory Visit
Admission: RE | Admit: 2014-10-01 | Discharge: 2014-10-01 | Disposition: A | Discharge status: Home / Self Care | Payer: BLUE CROSS/BLUE SHIELD | Source: Ambulatory Visit | Attending: Cardiology | Admitting: Cardiology

## 2014-10-01 ENCOUNTER — Ambulatory Visit (INDEPENDENT_AMBULATORY_CARE_PROVIDER_SITE_OTHER): Payer: BLUE CROSS/BLUE SHIELD | Admitting: Cardiovascular Disease

## 2014-10-01 ENCOUNTER — Telehealth: Payer: Self-pay | Admitting: Family Medicine

## 2014-10-01 ENCOUNTER — Encounter: Payer: Self-pay | Admitting: Cardiovascular Disease

## 2014-10-01 VITALS — BP 136/92 | HR 64 | Ht 67.0 in | Wt 223.5 lb

## 2014-10-01 DIAGNOSIS — Z720 Tobacco use: Secondary | ICD-10-CM

## 2014-10-01 DIAGNOSIS — I209 Angina pectoris, unspecified: Secondary | ICD-10-CM | POA: Insufficient documentation

## 2014-10-01 DIAGNOSIS — R5383 Other fatigue: Secondary | ICD-10-CM

## 2014-10-01 DIAGNOSIS — Z79899 Other long term (current) drug therapy: Secondary | ICD-10-CM

## 2014-10-01 DIAGNOSIS — D689 Coagulation defect, unspecified: Secondary | ICD-10-CM | POA: Diagnosis not present

## 2014-10-01 DIAGNOSIS — R079 Chest pain, unspecified: Secondary | ICD-10-CM

## 2014-10-01 LAB — BASIC METABOLIC PANEL
BUN: 10 mg/dL (ref 6–23)
CHLORIDE: 103 meq/L (ref 96–112)
CO2: 24 meq/L (ref 19–32)
CREATININE: 0.62 mg/dL (ref 0.50–1.10)
Calcium: 10.2 mg/dL (ref 8.4–10.5)
Glucose, Bld: 84 mg/dL (ref 70–99)
Potassium: 4.4 mEq/L (ref 3.5–5.3)
Sodium: 140 mEq/L (ref 135–145)

## 2014-10-01 LAB — CBC
HCT: 41 % (ref 36.0–46.0)
Hemoglobin: 13.6 g/dL (ref 12.0–15.0)
MCH: 31.1 pg (ref 26.0–34.0)
MCHC: 33.2 g/dL (ref 30.0–36.0)
MCV: 93.8 fL (ref 78.0–100.0)
MPV: 11.4 fL (ref 8.6–12.4)
Platelets: 203 10*3/uL (ref 150–400)
RBC: 4.37 MIL/uL (ref 3.87–5.11)
RDW: 13.4 % (ref 11.5–15.5)
WBC: 14.3 10*3/uL — ABNORMAL HIGH (ref 4.0–10.5)

## 2014-10-01 LAB — TSH: TSH: 1.454 u[IU]/mL (ref 0.350–4.500)

## 2014-10-01 NOTE — Telephone Encounter (Signed)
no

## 2014-10-01 NOTE — Assessment & Plan Note (Signed)
Mrs. Brianna Fletcher was referred by Dr. Etter Sjogren for evaluation of chest pain. Her risk factors include premature family history with a mother who had myocardial infarctions and stents in her 59s. The patient smoked remotely. She had an episode of prolonged chest pain approximately 6 weeks ago with left upper extremity radiation. She had a 2-D echo and a Myoview stress test which were essentially normal and low risk with what appeared to be diaphragmatic attenuation and she's had recurrent symptoms of weakness, nausea, diaphoresis and shortness of breath with left upper extremity radiation. Based on this, I decided to proceed with outpatient diagnostic coronary arteriography revealed the right radial approach.The patient understands that risks included but are not limited to stroke (1 in 1000), death (1 in 43), kidney failure [usually temporary] (1 in 500), bleeding (1 in 200), allergic reaction [possibly serious] (1 in 200). The patient understands and agrees to proceed

## 2014-10-01 NOTE — Patient Instructions (Signed)
Your physician has requested that you have a cardiac catheterization (radial). Cardiac catheterization is used to diagnose and/or treat various heart conditions. Doctors may recommend this procedure for a number of different reasons. The most common reason is to evaluate chest pain. Chest pain can be a symptom of coronary artery disease (CAD), and cardiac catheterization can show whether plaque is narrowing or blocking your heart's arteries. This procedure is also used to evaluate the valves, as well as measure the blood flow and oxygen levels in different parts of your heart. For further information please visit [email or web address].   Following your catheterization, you will not be allowed to drive for 3 days.  No lifting, pushing, or pulling greater that 10 pounds is allowed for 1 week.  You will be required to have the following tests prior to the procedure:  1. Blood work-the blood work can be done no more than 7 days prior to the procedure.  It can be done at any [facility] lab.  There is one downstairs on the first floor of this building and one in the [facility] building [id number]).  2. Chest Xray-the chest xray order has already been placed at the [facility].

## 2014-10-01 NOTE — Progress Notes (Signed)
10/01/2014 Brianna Fletcher   1970-06-11  [id number]  Primary Physician Garnet Koyanagi, DO Primary Cardiologist: Lorretta Harp MD [name]   HPI:  Brianna Fletcher is a 44 year old moderately overweight divorced Caucasian female mother of 2 children who works at [employer] She is accompanied by her boyfriend Brianna Fletcher today. She was referred by her primary care physician, Dr. Garnet Koyanagi , for cardiovascular evaluation because of chest pain and positive risk factors. Her risk factor profile is notable for family history with a mother who has had stents and myocardial infarction in her 53s. She smoked remotely. She is not diabetic. There are no other risk factors. She does have a myeloproliferative disorder and gets weekly interferon shots. [name] also had heavy menstrual periods with anemia requiring transfusion.  Six weeks ago she had a prolonged episode of substernal chest pain (10-15 minutes associated with shortness of breath or left upper extremity radiation. Subsequent to the echo was normal and a Myoview stress test showed diaphragmatic attenuation without ischemia. Over the last several days she's had episodes of weakness, diaphoresis nausea and shortness of breath with left upper extremity radiation. Based on this, I decided to proceed with outpatient diagnostic coronary arteriography to define her anatomy and rule out an ischemic etiology. I have thoroughly explained the risks and benefits the patient and her boyfriend.   Current Outpatient Prescriptions  Medication Sig Dispense Refill  . aspirin 81 MG tablet Take 81 mg by mouth daily.    Marland Kitchen doxepin (SINEQUAN) 50 MG capsule TAKE 1 CAPSULE BY MOUTH AT BEDTIME 30 capsule 3  . Melatonin 5 MG TABS Take 1 tablet by mouth as needed (sleep).     . mirabegron ER (MYRBETRIQ) 50 MG TB24 tablet Take 1 tablet (50 mg total) by mouth daily. 30 tablet 5  . Omega-3 Fatty Acids (FISH OIL) 1000 MG CAPS Take 1 capsule by mouth every morning.     Marland Kitchen  omeprazole (PRILOSEC) 20 MG capsule Take 1 capsule (20 mg total) by mouth daily. 30 capsule 3  . PEGASYS 180 MCG/ML injection Inject 0.45 mLs (81 mcg total) into the skin every 7 (seven) days. 4 mL 3  . lisinopril-hydrochlorothiazide (PRINZIDE,ZESTORETIC) 10-12.5 MG per tablet Take 1 tablet by mouth daily. (Patient not taking: Reported on 09/20/2014) 90 tablet 1   No current facility-administered medications for this visit.   Facility-Administered Medications Ordered in Other Visits  Medication Dose Route Frequency Provider Last Rate Last Dose  . acetaminophen (TYLENOL) tablet 650 mg  650 mg Oral Once [name], MD   650 mg at 04/26/14 1516  . peginterferon alfa-2a (PEGASYS) injection 81 mcg  81 mcg Subcutaneous Weekly [name], MD   81 mcg at 04/26/14 1530    No Known Allergies  History   Social History  . Marital Status: Divorced    Spouse Name: N/A  . Number of Children: N/A  . Years of Education: N/A   Occupational History  . Not on file.   Social History Main Topics  . Smoking status: Light Tobacco Smoker  . Smokeless tobacco: Never Used     Comment: patient reports light tobacco use, has not smoked since friday (09/27/14)  . Alcohol Use: No  . Drug Use: No  . Sexual Activity:    Partners: Male   Other Topics Concern  . Not on file   Social History Narrative     Review of Systems: General: negative for chills, fever, night sweats or weight changes.  Cardiovascular: negative for chest pain, dyspnea on exertion, edema, orthopnea, palpitations, paroxysmal nocturnal dyspnea or shortness of breath Dermatological: negative for rash Respiratory: negative for cough or wheezing Urologic: negative for hematuria Abdominal: negative for nausea, vomiting, diarrhea, bright red blood per rectum, melena, or hematemesis Neurologic: negative for visual changes, syncope, or dizziness All other systems reviewed and are otherwise negative except as noted above.    Blood  pressure 136/92, pulse 64, height 5\' 7"  (1.702 m), weight 223 lb 8 oz (101.379 kg).  General appearance: alert and no distress Neck: no adenopathy, no carotid bruit, no JVD, supple, symmetrical, trachea midline and thyroid not enlarged, symmetric, no tenderness/mass/nodules Lungs: clear to auscultation bilaterally Heart: regular rate and rhythm, S1, S2 normal, no murmur, click, rub or gallop Extremities: extremities normal, atraumatic, no cyanosis or edema  EKG not performed today  ASSESSMENT AND PLAN:   Chest pain Brianna Fletcher was referred by Dr. Etter Sjogren for evaluation of chest pain. Her risk factors include premature family history with a mother who had myocardial infarctions and stents in her 48s. The patient smoked remotely. She had an episode of prolonged chest pain approximately 6 weeks ago with left upper extremity radiation. She had a 2-D echo and a Myoview stress test which were essentially normal and low risk with what appeared to be diaphragmatic attenuation and she's had recurrent symptoms of weakness, nausea, diaphoresis and shortness of breath with left upper extremity radiation. Based on this, I decided to proceed with outpatient diagnostic coronary arteriography revealed the right radial approach.The patient understands that risks included but are not limited to stroke (1 in 1000), death (1 in 51), kidney failure [usually temporary] (1 in 500), bleeding (1 in 200), allergic reaction [possibly serious] (1 in 200). The patient understands and agrees to proceed      Lorretta Harp MD [facility], [facility] 10/01/2014 10:52 AM

## 2014-10-01 NOTE — Telephone Encounter (Signed)
Pt was no show 09/30/14 4:15pm, pt called 2:21pm with family emergency. will callback to reschedule, charge for no show?

## 2014-10-02 ENCOUNTER — Ambulatory Visit: Payer: BLUE CROSS/BLUE SHIELD | Admitting: Cardiovascular Disease

## 2014-10-02 ENCOUNTER — Telehealth: Payer: Self-pay | Admitting: *Deleted

## 2014-10-02 LAB — APTT: APTT: 27 s (ref 24–37)

## 2014-10-02 LAB — PROTIME-INR
INR: 0.91 (ref <1.50)
PROTHROMBIN TIME: 12.3 s (ref 11.6–15.2)

## 2014-10-02 MED ORDER — ASPIRIN 81 MG PO CHEW
81.0000 mg | CHEWABLE_TABLET | ORAL | Status: AC
  Administered 2014-10-03: 81 mg via ORAL

## 2014-10-02 NOTE — Telephone Encounter (Signed)
Patient has a scheduled appointment with this office on Friday. She is now scheduled for a Cardiac Cath this Thursday. She wants to know if Dr Marin Olp will still want see her Friday. Spoke with Dr Marin Olp who does want to keep the Friday appointment with patient. Patient aware.

## 2014-10-03 ENCOUNTER — Encounter (HOSPITAL_COMMUNITY)
Admission: RE | Disposition: A | Discharge status: Home / Self Care | Payer: BLUE CROSS/BLUE SHIELD | Source: Ambulatory Visit | Attending: Cardiovascular Disease

## 2014-10-03 ENCOUNTER — Ambulatory Visit (HOSPITAL_COMMUNITY)
Admission: RE | Admit: 2014-10-03 | Discharge: 2014-10-03 | Disposition: A | Discharge status: Home / Self Care | Payer: BLUE CROSS/BLUE SHIELD | Source: Ambulatory Visit | Attending: Cardiovascular Disease | Admitting: Cardiovascular Disease

## 2014-10-03 DIAGNOSIS — E663 Overweight: Secondary | ICD-10-CM | POA: Insufficient documentation

## 2014-10-03 DIAGNOSIS — Z8249 Family history of ischemic heart disease and other diseases of the circulatory system: Secondary | ICD-10-CM | POA: Insufficient documentation

## 2014-10-03 DIAGNOSIS — Z7982 Long term (current) use of aspirin: Secondary | ICD-10-CM | POA: Insufficient documentation

## 2014-10-03 DIAGNOSIS — C946 Myelodysplastic disease, not classified: Secondary | ICD-10-CM | POA: Diagnosis not present

## 2014-10-03 DIAGNOSIS — R5383 Other fatigue: Secondary | ICD-10-CM

## 2014-10-03 DIAGNOSIS — Z720 Tobacco use: Secondary | ICD-10-CM

## 2014-10-03 DIAGNOSIS — D689 Coagulation defect, unspecified: Secondary | ICD-10-CM

## 2014-10-03 DIAGNOSIS — I209 Angina pectoris, unspecified: Secondary | ICD-10-CM | POA: Diagnosis not present

## 2014-10-03 DIAGNOSIS — R531 Weakness: Secondary | ICD-10-CM | POA: Insufficient documentation

## 2014-10-03 DIAGNOSIS — R079 Chest pain, unspecified: Secondary | ICD-10-CM | POA: Diagnosis present

## 2014-10-03 DIAGNOSIS — D649 Anemia, unspecified: Secondary | ICD-10-CM | POA: Diagnosis not present

## 2014-10-03 DIAGNOSIS — Z79899 Other long term (current) drug therapy: Secondary | ICD-10-CM

## 2014-10-03 HISTORY — PX: CARDIAC CATHETERIZATION: SHX172

## 2014-10-03 SURGERY — LEFT HEART CATH AND CORONARY ANGIOGRAPHY
Anesthesia: LOCAL

## 2014-10-03 MED ORDER — ONDANSETRON HCL 4 MG/2ML IJ SOLN: 4.0000 mg | Freq: Four times a day (QID) | INTRAMUSCULAR | Status: DC | PRN

## 2014-10-03 MED ORDER — MORPHINE SULFATE 2 MG/ML IJ SOLN: 2.0000 mg | INTRAMUSCULAR | Status: DC | PRN

## 2014-10-03 MED ORDER — HEPARIN SODIUM (PORCINE) 1000 UNIT/ML IJ SOLN
INTRAMUSCULAR | Status: DC | PRN
  Administered 2014-10-03: 5000 [IU] via INTRAVENOUS

## 2014-10-03 MED ORDER — SODIUM CHLORIDE 0.9 % IJ SOLN: 3.0000 mL | INTRAMUSCULAR | Status: DC | PRN

## 2014-10-03 MED ORDER — RADIAL COCKTAIL (HEPARIN/VERAPAMIL/LIDOCAINE/NITRO)
Status: DC | PRN
  Administered 2014-10-03: 10 via INTRA_ARTERIAL

## 2014-10-03 MED ORDER — VERAPAMIL HCL 2.5 MG/ML IV SOLN
INTRAVENOUS | Status: AC
  Filled 2014-10-03: qty 2

## 2014-10-03 MED ORDER — ASPIRIN 81 MG PO CHEW
CHEWABLE_TABLET | ORAL | Status: AC
  Filled 2014-10-03: qty 1

## 2014-10-03 MED ORDER — SODIUM CHLORIDE 0.9 % WEIGHT BASED INFUSION: 1.0000 mL/kg/h | INTRAVENOUS | Status: DC

## 2014-10-03 MED ORDER — MIDAZOLAM HCL 2 MG/2ML IJ SOLN
INTRAMUSCULAR | Status: AC
  Filled 2014-10-03: qty 2

## 2014-10-03 MED ORDER — IOHEXOL 350 MG/ML SOLN
INTRAVENOUS | Status: DC | PRN
  Administered 2014-10-03: 60 mL via INTRA_ARTERIAL

## 2014-10-03 MED ORDER — SODIUM CHLORIDE 0.9 % IJ SOLN: 3.0000 mL | Freq: Two times a day (BID) | INTRAMUSCULAR | Status: DC

## 2014-10-03 MED ORDER — FENTANYL CITRATE (PF) 100 MCG/2ML IJ SOLN
INTRAMUSCULAR | Status: DC | PRN
  Administered 2014-10-03: 25 ug via INTRAVENOUS

## 2014-10-03 MED ORDER — MIDAZOLAM HCL 2 MG/2ML IJ SOLN
INTRAMUSCULAR | Status: DC | PRN
  Administered 2014-10-03: 1 mg via INTRAVENOUS

## 2014-10-03 MED ORDER — LIDOCAINE HCL (PF) 1 % IJ SOLN
INTRAMUSCULAR | Status: AC
  Filled 2014-10-03: qty 30

## 2014-10-03 MED ORDER — SODIUM CHLORIDE 0.9 % IV SOLN: 250.0000 mL | INTRAVENOUS | Status: DC | PRN

## 2014-10-03 MED ORDER — HEPARIN (PORCINE) IN NACL 2-0.9 UNIT/ML-% IJ SOLN
INTRAMUSCULAR | Status: AC
  Filled 2014-10-03: qty 1000

## 2014-10-03 MED ORDER — FENTANYL CITRATE (PF) 100 MCG/2ML IJ SOLN
INTRAMUSCULAR | Status: AC
  Filled 2014-10-03: qty 2

## 2014-10-03 MED ORDER — SODIUM CHLORIDE 0.9 % WEIGHT BASED INFUSION: 3.0000 mL/kg/h | INTRAVENOUS | Status: DC

## 2014-10-03 MED ORDER — SODIUM CHLORIDE 0.9 % WEIGHT BASED INFUSION
3.0000 mL/kg/h | INTRAVENOUS | Status: AC
Start: 2014-10-03 — End: 2014-10-03
  Administered 2014-10-03: 3 mL/kg/h via INTRAVENOUS

## 2014-10-03 MED ORDER — HEPARIN SODIUM (PORCINE) 1000 UNIT/ML IJ SOLN
INTRAMUSCULAR | Status: AC
  Filled 2014-10-03: qty 1

## 2014-10-03 MED ORDER — ACETAMINOPHEN 325 MG PO TABS: 650.0000 mg | ORAL_TABLET | ORAL | Status: DC | PRN

## 2014-10-03 MED ORDER — NITROGLYCERIN 1 MG/10 ML FOR IR/CATH LAB
INTRA_ARTERIAL | Status: AC
  Filled 2014-10-03: qty 10

## 2014-10-03 SURGICAL SUPPLY — 11 items

## 2014-10-03 NOTE — Discharge Instructions (Signed)
Radial Site Care °Refer to this sheet in the next few weeks. These instructions provide you with information on caring for yourself after your procedure. Your caregiver may also give you more specific instructions. Your treatment has been planned according to current medical practices, but problems sometimes occur. Call your caregiver if you have any problems or questions after your procedure. °HOME CARE INSTRUCTIONS °· You may shower the day after the procedure. Remove the bandage (dressing) and gently wash the site with plain soap and water. Gently pat the site dry. °· Do not apply powder or lotion to the site. °· Do not submerge the affected site in water for 3 to 5 days. °· Inspect the site at least twice daily. °· Do not flex or bend the affected arm for 24 hours. °· No lifting over 5 pounds (2.3 kg) for 5 days after your procedure. °· Do not drive home if you are discharged the same day of the procedure. Have someone else drive you. °· You may drive 24 hours after the procedure unless otherwise instructed by your caregiver. °· Do not operate machinery or power tools for 24 hours. °· A responsible adult should be with you for the first 24 hours after you arrive home. °What to expect: °· Any bruising will usually fade within 1 to 2 weeks. °· Blood that collects in the tissue (hematoma) may be painful to the touch. It should usually decrease in size and tenderness within 1 to 2 weeks. °SEEK IMMEDIATE MEDICAL CARE IF: °· You have unusual pain at the radial site. °· You have redness, warmth, swelling, or pain at the radial site. °· You have drainage (other than a small amount of blood on the dressing). °· You have chills. °· You have a fever or persistent symptoms for more than 72 hours. °· You have a fever and your symptoms suddenly get worse. °· Your arm becomes pale, cool, tingly, or numb. °· You have heavy bleeding from the site. Hold pressure on the site. °Document Released: 04/10/2010 Document Revised:  05/31/2011 Document Reviewed: 04/10/2010 °ExitCare® Patient Information ©2015 [employer]. This information is not intended to replace advice given to you by your health care provider. Make sure you discuss any questions you have with your health care provider. ° °

## 2014-10-03 NOTE — Interval H&P Note (Signed)
Cath Lab Visit (complete for each Cath Lab visit)  Clinical Evaluation Leading to the Procedure:   ACS: No.  Non-ACS:    Anginal Classification: CCS III  Anti-ischemic medical therapy: No Therapy  Non-Invasive Test Results: Low-risk stress test findings: cardiac mortality <1%/year  Prior CABG: No previous CABG      History and Physical Interval Note:  10/03/2014 1:46 PM  Brianna Fletcher  has presented today for surgery, with the diagnosis of c/p  The various methods of treatment have been discussed with the patient and family. After consideration of risks, benefits and other options for treatment, the patient has consented to  Procedure(s): Left Heart Cath and Coronary Angiography (N/A) as a surgical intervention .  The patient's history has been reviewed, patient examined, no change in status, stable for surgery.  I have reviewed the patient's chart and labs.  Questions were answered to the patient's satisfaction.     Quay Burow

## 2014-10-03 NOTE — H&P (View-Only) (Signed)
10/01/2014 Brianna Fletcher   1970-11-18  [id number]  Primary Physician Garnet Koyanagi, DO Primary Cardiologist: Lorretta Harp MD Brianna Fletcher   HPI:  Brianna Fletcher is a 44 year old moderately overweight divorced Caucasian female mother of 2 children who works at [employer] She is accompanied by her boyfriend Quita Skye today. She was referred by her primary care physician, Dr. Garnet Koyanagi , for cardiovascular evaluation because of chest pain and positive risk factors. Her risk factor profile is notable for family history with a mother who has had stents and myocardial infarction in her 5s. She smoked remotely. She is not diabetic. There are no other risk factors. She does have a myeloproliferative disorder and gets weekly interferon shots. [name] also had heavy menstrual periods with anemia requiring transfusion.  Six weeks ago she had a prolonged episode of substernal chest pain (10-15 minutes associated with shortness of breath or left upper extremity radiation. Subsequent to the echo was normal and a Myoview stress test showed diaphragmatic attenuation without ischemia. Over the last several days she's had episodes of weakness, diaphoresis nausea and shortness of breath with left upper extremity radiation. Based on this, I decided to proceed with outpatient diagnostic coronary arteriography to define her anatomy and rule out an ischemic etiology. I have thoroughly explained the risks and benefits the patient and her boyfriend.   Current Outpatient Prescriptions  Medication Sig Dispense Refill  . aspirin 81 MG tablet Take 81 mg by mouth daily.    Marland Kitchen doxepin (SINEQUAN) 50 MG capsule TAKE 1 CAPSULE BY MOUTH AT BEDTIME 30 capsule 3  . Melatonin 5 MG TABS Take 1 tablet by mouth as needed (sleep).     . mirabegron ER (MYRBETRIQ) 50 MG TB24 tablet Take 1 tablet (50 mg total) by mouth daily. 30 tablet 5  . Omega-3 Fatty Acids (FISH OIL) 1000 MG CAPS Take 1 capsule by mouth every morning.     Marland Kitchen  omeprazole (PRILOSEC) 20 MG capsule Take 1 capsule (20 mg total) by mouth daily. 30 capsule 3  . PEGASYS 180 MCG/ML injection Inject 0.45 mLs (81 mcg total) into the skin every 7 (seven) days. 4 mL 3  . lisinopril-hydrochlorothiazide (PRINZIDE,ZESTORETIC) 10-12.5 MG per tablet Take 1 tablet by mouth daily. (Patient not taking: Reported on 09/20/2014) 90 tablet 1   No current facility-administered medications for this visit.   Facility-Administered Medications Ordered in Other Visits  Medication Dose Route Frequency Provider Last Rate Last Dose  . acetaminophen (TYLENOL) tablet 650 mg  650 mg Oral Once [name], MD   650 mg at 04/26/14 1516  . peginterferon alfa-2a (PEGASYS) injection 81 mcg  81 mcg Subcutaneous Weekly [name], MD   81 mcg at 04/26/14 1530    No Known Allergies  History   Social History  . Marital Status: Divorced    Spouse Name: N/A  . Number of Children: N/A  . Years of Education: N/A   Occupational History  . Not on file.   Social History Main Topics  . Smoking status: Light Tobacco Smoker  . Smokeless tobacco: Never Used     Comment: patient reports light tobacco use, has not smoked since friday (09/27/14)  . Alcohol Use: No  . Drug Use: No  . Sexual Activity:    Partners: Male   Other Topics Concern  . Not on file   Social History Narrative     Review of Systems: General: negative for chills, fever, night sweats or weight changes.  Cardiovascular: negative for chest pain, dyspnea on exertion, edema, orthopnea, palpitations, paroxysmal nocturnal dyspnea or shortness of breath Dermatological: negative for rash Respiratory: negative for cough or wheezing Urologic: negative for hematuria Abdominal: negative for nausea, vomiting, diarrhea, bright red blood per rectum, melena, or hematemesis Neurologic: negative for visual changes, syncope, or dizziness All other systems reviewed and are otherwise negative except as noted above.    Blood  pressure 136/92, pulse 64, height 5\' 7"  (1.702 m), weight 223 lb 8 oz (101.379 kg).  General appearance: alert and no distress Neck: no adenopathy, no carotid bruit, no JVD, supple, symmetrical, trachea midline and thyroid not enlarged, symmetric, no tenderness/mass/nodules Lungs: clear to auscultation bilaterally Heart: regular rate and rhythm, S1, S2 normal, no murmur, click, rub or gallop Extremities: extremities normal, atraumatic, no cyanosis or edema  EKG not performed today  ASSESSMENT AND PLAN:   Chest pain [name] was referred by Dr. Etter Sjogren for evaluation of chest pain. Her risk factors include premature family history with a mother who had myocardial infarctions and stents in her 65s. The patient smoked remotely. She had an episode of prolonged chest pain approximately 6 weeks ago with left upper extremity radiation. She had a 2-D echo and a Myoview stress test which were essentially normal and low risk with what appeared to be diaphragmatic attenuation and she's had recurrent symptoms of weakness, nausea, diaphoresis and shortness of breath with left upper extremity radiation. Based on this, I decided to proceed with outpatient diagnostic coronary arteriography revealed the right radial approach.The patient understands that risks included but are not limited to stroke (1 in 1000), death (1 in 52), kidney failure [usually temporary] (1 in 500), bleeding (1 in 200), allergic reaction [possibly serious] (1 in 200). The patient understands and agrees to proceed      Lorretta Harp MD [facility], [facility] 10/01/2014 10:52 AM

## 2014-10-04 ENCOUNTER — Ambulatory Visit: Payer: BLUE CROSS/BLUE SHIELD

## 2014-10-04 ENCOUNTER — Ambulatory Visit: Payer: BLUE CROSS/BLUE SHIELD | Admitting: Cardiology

## 2014-10-04 ENCOUNTER — Telehealth: Payer: Self-pay | Admitting: Cardiovascular Disease

## 2014-10-04 ENCOUNTER — Ambulatory Visit (HOSPITAL_BASED_OUTPATIENT_CLINIC_OR_DEPARTMENT_OTHER): Payer: BLUE CROSS/BLUE SHIELD | Admitting: Hematology & Oncology

## 2014-10-04 ENCOUNTER — Encounter (HOSPITAL_COMMUNITY): Payer: Self-pay | Admitting: Cardiovascular Disease

## 2014-10-04 ENCOUNTER — Other Ambulatory Visit (HOSPITAL_BASED_OUTPATIENT_CLINIC_OR_DEPARTMENT_OTHER): Payer: BLUE CROSS/BLUE SHIELD

## 2014-10-04 VITALS — BP 133/68 | HR 76 | Temp 97.6°F | Resp 18 | Wt 223.0 lb

## 2014-10-04 DIAGNOSIS — D471 Chronic myeloproliferative disease: Secondary | ICD-10-CM

## 2014-10-04 LAB — CMP (CANCER CENTER ONLY)
ALBUMIN: 4.4 g/dL (ref 3.3–5.5)
ALT(SGPT): 19 U/L (ref 10–47)
AST: 25 U/L (ref 11–38)
Alkaline Phosphatase: 146 U/L — ABNORMAL HIGH (ref 26–84)
BUN, Bld: 11 mg/dL (ref 7–22)
CO2: 26 meq/L (ref 18–33)
Calcium: 10.3 mg/dL (ref 8.0–10.3)
Chloride: 107 mEq/L (ref 98–108)
Creat: 0.5 mg/dl — ABNORMAL LOW (ref 0.6–1.2)
Glucose, Bld: 107 mg/dL (ref 73–118)
Potassium: 4.1 mEq/L (ref 3.3–4.7)
SODIUM: 145 meq/L (ref 128–145)
TOTAL PROTEIN: 8.5 g/dL — AB (ref 6.4–8.1)
Total Bilirubin: 0.9 mg/dl (ref 0.20–1.60)

## 2014-10-04 LAB — CBC WITH DIFFERENTIAL (CANCER CENTER ONLY)
BASO#: 0 10*3/uL (ref 0.0–0.2)
BASO%: 0.1 % (ref 0.0–2.0)
EOS ABS: 0 10*3/uL (ref 0.0–0.5)
EOS%: 0.1 % (ref 0.0–7.0)
HCT: 42 % (ref 34.8–46.6)
HGB: 14.1 g/dL (ref 11.6–15.9)
LYMPH#: 2.1 10*3/uL (ref 0.9–3.3)
LYMPH%: 15.3 % (ref 14.0–48.0)
MCH: 32 pg (ref 26.0–34.0)
MCHC: 33.6 g/dL (ref 32.0–36.0)
MCV: 96 fL (ref 81–101)
MONO#: 0.7 10*3/uL (ref 0.1–0.9)
MONO%: 5 % (ref 0.0–13.0)
NEUT%: 79.5 % (ref 39.6–80.0)
NEUTROS ABS: 10.9 10*3/uL — AB (ref 1.5–6.5)
Platelets: 180 10*3/uL (ref 145–400)
RBC: 4.4 10*6/uL (ref 3.70–5.32)
RDW: 13.3 % (ref 11.1–15.7)
WBC: 13.7 10*3/uL — ABNORMAL HIGH (ref 3.9–10.0)

## 2014-10-04 LAB — CHCC SATELLITE - SMEAR

## 2014-10-04 LAB — LACTATE DEHYDROGENASE: LDH: 131 U/L (ref 94–250)

## 2014-10-04 MED ORDER — ACETAMINOPHEN 500 MG PO TABS
ORAL_TABLET | ORAL | Status: AC
  Filled 2014-10-04: qty 2

## 2014-10-04 MED FILL — Heparin Sodium (Porcine) 2 Unit/ML in Sodium Chloride 0.9%: INTRAMUSCULAR | Qty: 1000 | Status: AC

## 2014-10-04 MED FILL — Nitroglycerin IV Soln 100 MCG/ML in D5W: INTRA_ARTERIAL | Qty: 10 | Status: AC

## 2014-10-04 MED FILL — Lidocaine HCl Local Preservative Free (PF) Inj 1%: INTRAMUSCULAR | Qty: 30 | Status: AC

## 2014-10-04 NOTE — Progress Notes (Signed)
Hematology and Oncology Follow Up Visit  Brianna Fletcher [id number] February 15, 1971 44 y.o. 10/04/2014   Principle Diagnosis:  Chronic myeloproliferative syndrome-Triple negative  Current Therapy:   Peg-Interferon q weekly - to go to every 2 week dosing.     Interim History:  [name] is back for followup. She is tolerating the peg-interferon well.   She did undergo a cardiac cath couple days ago. This is because of some issues that were vague in nature. Thank you, the cardiac cath did not show any obvious stenosis or obstruction. She had good cardiac function.  She has not had any syncope. There's been no chest pain area  She and her boyfriend will be going on a cruise to the [place] in 3 weeks. She is looking for to this. I told her to wear a lot of sunscreen.  She is working.  [name] having no issues with rashes. Pruritus does not appear to be as much of a problem.   Overall, her performance status is ECOG 1.   Medications:  Current outpatient prescriptions:  .  aspirin 81 MG tablet, Take 81 mg by mouth daily., Disp: , Rfl:  .  doxepin (SINEQUAN) 50 MG capsule, TAKE 1 CAPSULE BY MOUTH AT BEDTIME, Disp: 30 capsule, Rfl: 3 .  lisinopril-hydrochlorothiazide (PRINZIDE,ZESTORETIC) 10-12.5 MG per tablet, Take 1 tablet by mouth daily. (Patient not taking: Reported on 09/20/2014), Disp: 90 tablet, Rfl: 1 .  Melatonin 5 MG TABS, Take 1 tablet by mouth as needed (sleep). , Disp: , Rfl:  .  mirabegron ER (MYRBETRIQ) 50 MG TB24 tablet, Take 1 tablet (50 mg total) by mouth daily., Disp: 30 tablet, Rfl: 5 .  Omega-3 Fatty Acids (FISH OIL) 1000 MG CAPS, Take 1 capsule by mouth every morning. , Disp: , Rfl:  .  omeprazole (PRILOSEC) 20 MG capsule, Take 1 capsule (20 mg total) by mouth daily., Disp: 30 capsule, Rfl: 3 .  PEGASYS 180 MCG/ML injection, Inject 0.45 mLs (81 mcg total) into the skin every 7 (seven) days., Disp: 4 mL, Rfl: 3 No current facility-administered medications for this  visit.  Facility-Administered Medications Ordered in Other Visits:  .  acetaminophen (TYLENOL) tablet 650 mg, 650 mg, Oral, Once, [name], MD, 650 mg at 04/26/14 1516 .  peginterferon alfa-2a (PEGASYS) injection 81 mcg, 81 mcg, Subcutaneous, Weekly, [name], MD, 81 mcg at 04/26/14 1530  Allergies: No Known Allergies  Past Medical History, Surgical history, Social history, and Family History were reviewed and updated.  Review of Systems: As above  Physical Exam:  weight is 223 lb (101.152 kg). Her oral temperature is 97.6 F (36.4 C). Her blood pressure is 133/68 and her pulse is 76. Her respiration is 18 and oxygen saturation is 100%.   Well-developed and well-nourished white female. Head and neck exam shows no ocular or oral lesions. She has no palpable cervical or supraclavicular lymph nodes. Lungs are clear. Cardiac exam regular rate and rhythm with no murmurs rubs or bruits. Abdomen is soft. She has good bowel sounds. There is no fluid wave. There is no palpable liver or spleen tip. Back exam shows no tenderness over the spine or ribs. There is some tenderness over the right hip area. This is chronic. She has some slight decreased range of motion of the right hip. Extremities shows no clubbing, cyanosis or edema. Skin exam no rashes, ecchymosis or petechia. Her skin is a little dry. Neurological exam shows no focal neurological deficits.  Lab Results  Component Value Date   WBC 13.7* 10/04/2014   HGB 14.1 10/04/2014   HCT 42.0 10/04/2014   MCV 96 10/04/2014   PLT 180 10/04/2014     Chemistry      Component Value Date/Time   NA 145 10/04/2014 1357   NA 140 10/01/2014 1115   K 4.1 10/04/2014 1357   K 4.4 10/01/2014 1115   CL 107 10/04/2014 1357   CL 103 10/01/2014 1115   CO2 26 10/04/2014 1357   CO2 24 10/01/2014 1115   BUN 11 10/04/2014 1357   BUN 10 10/01/2014 1115   CREATININE 0.5* 10/04/2014 1357   CREATININE 0.67 09/26/2014 1520      Component Value  Date/Time   CALCIUM 10.3 10/04/2014 1357   CALCIUM 10.2 10/01/2014 1115   ALKPHOS 146* 10/04/2014 1357   ALKPHOS 150* 09/26/2014 1520   AST 25 10/04/2014 1357   AST 19 09/26/2014 1520   ALT 19 10/04/2014 1357   ALT 14 09/26/2014 1520   BILITOT 0.90 10/04/2014 1357   BILITOT 0.4 09/26/2014 1520         Impression and Plan: Brianna Fletcher is 44 year old female. She has a myeloproliferative syndrome. So far, our workup has been totally negative.  Her white cell count tends to fluctuate. However, symptomatically, she is doing quite well. She is not itching nearly as much.  I think we'll try to move her to miss every 2 weeks. I think this would be reasonable. Her white cell count has really remained stable.  I want to keep her at 2 week intervals probably for 4 months. If her white cell count continues to stabilize, then I would probably go to every 3 weeks. At some point, we might be able to get her off the interferon. I think that were the case, it might be a year before that happens.   I want to see her back in 6 weeks.  [name], MD 7/15/20163:55 PM

## 2014-10-04 NOTE — Telephone Encounter (Signed)
I have no problem for her to have a note saying that

## 2014-10-04 NOTE — Telephone Encounter (Signed)
Spoke to patient She states she has been out of work this week ,due to her primary having her out this week  She states she needs a note stating she can return to work on Monday without restriction. She states she takes incoming call all day. She would like the note e-mail to her- she has my chart

## 2014-10-04 NOTE — Telephone Encounter (Signed)
Informed patient  Letter was sent to [place] TO GET INTO my chart She will call the 800 number ,offer to reactivate she states wanted to try the 800 number again. RN informed patient office is open to 5 if she needs any assistance.

## 2014-10-04 NOTE — Telephone Encounter (Signed)
Patient needs note to return to work on [facility] like to have that emailed to her.  Please call.

## 2014-10-07 ENCOUNTER — Telehealth: Payer: Self-pay | Admitting: Hematology & Oncology

## 2014-10-07 ENCOUNTER — Telehealth: Payer: Self-pay | Admitting: Cardiovascular Disease

## 2014-10-07 ENCOUNTER — Encounter: Payer: Self-pay | Admitting: *Deleted

## 2014-10-07 NOTE — Telephone Encounter (Signed)
Pt called back requesting work note. Return to work note written for tomorrow. Pt will call w/ fax number where this can be sent.

## 2014-10-07 NOTE — Telephone Encounter (Signed)
Spoke with pt regarding future appt and schedule for Aug

## 2014-10-07 NOTE — Telephone Encounter (Signed)
Attempted to reach patient, goes directly to unavailable message box.  [facility] for flex availability, no answer on [employer] when attempted. Checked [employer] appt calendar, no availability. Will send msg to [facility] scheduling for possible option of add-in for tomorrow.

## 2014-10-07 NOTE — Telephone Encounter (Signed)
Pt had Cath on Thursday,was doing fine. Pain started yesterday in her entire right arm,pain was so bad,she could not sleep.Please call to advise,might need to be seen.f

## 2014-10-07 NOTE — Telephone Encounter (Signed)
Flex clinic appointment [facility]

## 2014-10-07 NOTE — Telephone Encounter (Signed)
Called patient.  Had cath Thursday, some on and off soreness since.  Worst pain was yesterday, she states she was unable to sleep last night until around 4am - took tylenol to relieve & this helped. She is concerned d/t pain being worse yesterday than prior days. Describes as soreness. Rates 5/10.  Notes not just at radial site, hurts all over arm. Denies tingling or numbness. Denies extremity discoloration.  Advised to continue OTC meds. Will route to [employer], see if anything further recommended.

## 2014-10-08 ENCOUNTER — Ambulatory Visit (HOSPITAL_COMMUNITY): Payer: BLUE CROSS/BLUE SHIELD | Attending: Cardiology

## 2014-10-08 ENCOUNTER — Encounter: Payer: Self-pay | Admitting: Physician Assistant

## 2014-10-08 ENCOUNTER — Ambulatory Visit (INDEPENDENT_AMBULATORY_CARE_PROVIDER_SITE_OTHER): Payer: BLUE CROSS/BLUE SHIELD | Admitting: Physician Assistant

## 2014-10-08 ENCOUNTER — Other Ambulatory Visit: Payer: Self-pay | Admitting: Physician Assistant

## 2014-10-08 ENCOUNTER — Ambulatory Visit: Payer: BLUE CROSS/BLUE SHIELD | Admitting: Physician Assistant

## 2014-10-08 VITALS — BP 138/78 | HR 69 | Ht 67.0 in | Wt 227.4 lb

## 2014-10-08 DIAGNOSIS — I70208 Unspecified atherosclerosis of native arteries of extremities, other extremity: Secondary | ICD-10-CM

## 2014-10-08 DIAGNOSIS — M79601 Pain in right arm: Secondary | ICD-10-CM

## 2014-10-08 DIAGNOSIS — I742 Embolism and thrombosis of arteries of the upper extremities: Secondary | ICD-10-CM

## 2014-10-08 DIAGNOSIS — I748 Embolism and thrombosis of other arteries: Secondary | ICD-10-CM | POA: Insufficient documentation

## 2014-10-08 DIAGNOSIS — I2510 Atherosclerotic heart disease of native coronary artery without angina pectoris: Secondary | ICD-10-CM | POA: Diagnosis not present

## 2014-10-08 DIAGNOSIS — Z9889 Other specified postprocedural states: Secondary | ICD-10-CM | POA: Diagnosis not present

## 2014-10-08 MED ORDER — HYDROCODONE-ACETAMINOPHEN 5-325 MG PO TABS: 1.0000 | ORAL_TABLET | Freq: Four times a day (QID) | ORAL | Status: DC | PRN

## 2014-10-08 NOTE — Progress Notes (Signed)
Cardiology Office Note   Date:  10/08/2014   ID:  Brianna Fletcher, DOB 08/07/1970, MRN [id number]  PCP:  Garnet Koyanagi, DO  Cardiologist:  Dr Stacy Gardner, PA-C   No chief complaint on file.   History of Present Illness: Brianna Fletcher is a 44 y.o. female with a history of obesity, Myeloproliferative d/o, chest pain with normal cors by cath 10/03/2014, FH premature CAD.   Brianna Fletcher presents for evaluation of R arm pain post-cath.   Her R arm was initially a little sore but was doing OK. Starting 4 days ago, she developed pain in her R arm, not associated with particular movements. It is sometimes in the forearm and sometimes in the upper arm. She gets partial relief with alternating Tylenol and Ibuprofen, but the pain returns if she is late with the dosing. She had blood drawn 5 days ago (her usual weekly draw for her blood d/o) but has not signs of infection at the site. She has had no fevers or chills.   She had some minor lower L rib pain and possibly some edema yesterday, but that has improved.  She has not had swelling, streaks, or signs of infection. There has been no numbness, pallor, rubor or edema. There is some ecchymosis at the radial cath site. There is also a small area of ecchymosis close to the elbow.    Past Medical History  Diagnosis Date  . Glaucoma   . Arthritis   . Leukocytosis, unspecified 11/15/2012  . Chronic neutrophilia 11/15/2012  . Pruritus 11/15/2012  . Monocytosis 11/15/2012  . Myeloproliferative disorder 03/08/2013  . Precordial chest pain   . Family history of heart disease     mother had stents placed in her 24s and had myocardial infarctions    Past Surgical History  Procedure Laterality Date  . Tubal ligation  2000  . Eye surgery      laser eye surgery  . Cardiac catheterization N/A 10/03/2014    Procedure: Left Heart Cath and Coronary Angiography;  Surgeon: Lorretta Harp, MD;  Location: [facility];  Service:  Cardiovascular;  Laterality: N/A;    Current Outpatient Prescriptions  Medication Sig Dispense Refill  . aspirin 81 MG tablet Take 81 mg by mouth daily.    Marland Kitchen doxepin (SINEQUAN) 50 MG capsule TAKE 1 CAPSULE BY MOUTH AT BEDTIME 30 capsule 3  . lisinopril-hydrochlorothiazide (PRINZIDE,ZESTORETIC) 10-12.5 MG per tablet Take 1 tablet by mouth daily. 90 tablet 1  . Melatonin 5 MG TABS Take 1 tablet by mouth as needed (sleep).     . mirabegron ER (MYRBETRIQ) 50 MG TB24 tablet Take 1 tablet (50 mg total) by mouth daily. 30 tablet 5  . Omega-3 Fatty Acids (FISH OIL) 1000 MG CAPS Take 1 capsule by mouth every morning.     Marland Kitchen omeprazole (PRILOSEC) 20 MG capsule Take 1 capsule (20 mg total) by mouth daily. 30 capsule 3  . PEGASYS 180 MCG/ML injection Inject 0.45 mLs (81 mcg total) into the skin every 7 (seven) days. 4 mL 3  . Vitamin D, Ergocalciferol, (DRISDOL) 50000 UNITS CAPS capsule Take 50,000 Units by mouth every 7 (seven) days.   1   No current facility-administered medications for this visit.   Facility-Administered Medications Ordered in Other Visits  Medication Dose Route Frequency Provider Last Rate Last Dose  . acetaminophen (TYLENOL) tablet 650 mg  650 mg Oral Once [name], MD   650 mg at 04/26/14  1516  . peginterferon alfa-2a (PEGASYS) injection 81 mcg  81 mcg Subcutaneous Weekly [name], MD   81 mcg at 04/26/14 1530    Allergies:   Review of patient's allergies indicates no known allergies.    Social History:  The patient  reports that she has been smoking.  She has never used smokeless tobacco. She reports that she does not drink alcohol or use illicit drugs.   Family History:  The patient's family history includes Cancer in her father; Dementia in her maternal grandmother; Diabetes in her father; Heart disease in her cousin, maternal grandmother, and mother; Heart disease (age of onset: 19) in her maternal grandfather; Hyperlipidemia in her mother; Hypertension in her  mother. There is no history of Other.    ROS:  Please see the history of present illness. All other systems are reviewed and negative.    PHYSICAL EXAM: VS:  BP 138/78 mmHg  Pulse 69  Ht 5\' 7"  (1.702 m)  Wt 227 lb 6.4 oz (103.148 kg)  BMI 35.61 kg/m2  LMP 08/19/2014 (Approximate) , BMI Body mass index is 35.61 kg/(m^2). GEN: Well nourished, well developed, in no acute distress HEENT: normal Neck: no JVD, carotid bruits, or masses Cardiac: RRR; no murmurs, rubs, or gallops,no edema  Respiratory:  clear to auscultation bilaterally, normal work of breathing GI: soft, nontender, nondistended, + BS MS: no deformity or atrophy. Area of moderate tenderness and ecchymosis over the cath site. The pain is not elicited or worsened by any movement or any resistance testing. Distal pulses are intact but seem slightly diminished R radial, unclear if due to tenderness in area making palpation difficult. Skin: warm and dry, no rash Neuro:  Strength and sensation are intact Psych: euthymic mood, full affect  EKG:  EKG is ordered today. The ekg ordered today demonstrates SR, no acute changes, lateral T waves slightly smaller than previous ones.  Recent Labs: 08/26/2014: Magnesium 2.2 10/01/2014: TSH 1.454 10/04/2014: ALT(SGPT) 19; BUN, Bld 11; Creat 0.5*; HGB 14.1; Platelets 180; Potassium 4.1; Sodium 145    Lipid Panel    Component Value Date/Time   CHOL 172 07/01/2014 1151   TRIG 82.0 07/01/2014 1151   HDL 46.90 07/01/2014 1151   CHOLHDL 4 07/01/2014 1151   VLDL 16.4 07/01/2014 1151   LDLCALC 109* 07/01/2014 1151     Wt Readings from Last 3 Encounters:  10/08/14 227 lb 6.4 oz (103.148 kg)  10/04/14 223 lb (101.152 kg)  10/03/14 223 lb (101.152 kg)     Other studies Reviewed: Additional studies/ records that were reviewed today include: cath report, ECGs and notes.  ASSESSMENT AND PLAN:  1.  R arm pain: no obvious evidence of vascular compromise (arterial or venous). However, with  her hx of myeloproliferative d/o, will do [place] to check vascular systems (arterial and venous). If this is negative, will order high-dose Ibuprofen if OK w/ Dr Marin Olp (doubtful). Can use vicodin short-term for pain control and return in a week if no better. No Xrays at this time because no evidence of MS problem.  2. Chest pain: improving on rx for her arm, no obvious cause and no respiratory compromise. Rx prn.   Current medicines are reviewed at length with the patient today.  The patient does not have concerns regarding medicines.  The following changes have been made:  Add Vicodin (and Nsaids as oncology will allow)  Labs/ tests ordered today include:  RUE pseudo check with brachial studies also ECG   Disposition:  FU with Dr Gwenlyn Found PRN   Signed, Lenoard Aden  10/08/2014 4:09 PM    [facility] [address], [place], [place]  [address] Phone: [phone]; Fax: [phone]   Addendum: US showed no pseudo but R radial artery is occluded 2nd cath. Ulnar artery is triphasic, no compromise of flow to hand. Reviewed results w/ Dr Burt Knack. No further evaluation, pain control only thing needed. Will give Vicodin PRN, f/u in 1 week to make sure is doing OK. 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  Lenoard Aden 10/08/2014 5:04 PM

## 2014-10-08 NOTE — Patient Instructions (Addendum)
Medication Instructions:  Your physician has recommended you make the following change in your medication:   1-TAKE Vicodin (hydrocodone- acetaminophen) 5-325 mg per tablet (1 to 2 tablets) by mouth as needed every 6 hours for pain.   Labwork: NONE  Testing/Procedures: Your physician has requested that you have a right upper extremity venous duplex. This test is an ultrasound of the veins in the right arm. It looks at venous blood flow that carries blood from the heart to the right arm. Your physician has requested that you have a right upper extremity arterial duplex. This test is an ultrasound of the arteries in the right arm. It looks at arterial blood flow in the right arm.   Follow-Up: Your physician recommends that you schedule a follow-up appointment in: 1 week with NP or PA, and with Dr. Gwenlyn Found as needed.  Any Other Special Instructions Will Be Listed Below (If Applicable).

## 2014-10-14 ENCOUNTER — Encounter: Payer: Self-pay | Admitting: *Deleted

## 2014-10-14 NOTE — Progress Notes (Unsigned)
[place] Study Informed Consent   Subject Name: Brianna Fletcher  Subject met inclusion and exclusion criteria. The informed consent form, study requirements and expectations were reviewed with the subject and questions and concerns were addressed prior to the signing of the consent form. The subject verbalized understanding of the trial requirements. The subject agreed to participate in the [place] and signed the informed consent. The informed consent was obtained prior to performance of any protocol-specific procedures for the subject. A copy of the signed informed consent was given to the subject and a copy was placed in the subject's medical record.  Jake Bathe 10/03/2014

## 2014-10-15 ENCOUNTER — Encounter: Payer: Self-pay | Admitting: Family Medicine

## 2014-10-15 ENCOUNTER — Encounter: Payer: Self-pay | Admitting: Cardiovascular Disease

## 2014-10-15 ENCOUNTER — Ambulatory Visit (INDEPENDENT_AMBULATORY_CARE_PROVIDER_SITE_OTHER): Payer: BLUE CROSS/BLUE SHIELD | Admitting: Family Medicine

## 2014-10-15 ENCOUNTER — Telehealth: Payer: Self-pay | Admitting: Cardiovascular Disease

## 2014-10-15 ENCOUNTER — Ambulatory Visit (INDEPENDENT_AMBULATORY_CARE_PROVIDER_SITE_OTHER): Payer: BLUE CROSS/BLUE SHIELD | Admitting: Cardiovascular Disease

## 2014-10-15 VITALS — BP 128/90 | HR 60 | Ht 67.0 in | Wt 227.0 lb

## 2014-10-15 VITALS — BP 134/82 | HR 78 | Temp 97.8°F | Ht 67.0 in | Wt 227.0 lb

## 2014-10-15 DIAGNOSIS — R635 Abnormal weight gain: Secondary | ICD-10-CM | POA: Diagnosis not present

## 2014-10-15 DIAGNOSIS — I742 Embolism and thrombosis of arteries of the upper extremities: Secondary | ICD-10-CM

## 2014-10-15 DIAGNOSIS — E559 Vitamin D deficiency, unspecified: Secondary | ICD-10-CM | POA: Diagnosis not present

## 2014-10-15 DIAGNOSIS — R079 Chest pain, unspecified: Secondary | ICD-10-CM | POA: Diagnosis not present

## 2014-10-15 DIAGNOSIS — I70208 Unspecified atherosclerosis of native arteries of extremities, other extremity: Secondary | ICD-10-CM

## 2014-10-15 DIAGNOSIS — I10 Essential (primary) hypertension: Secondary | ICD-10-CM | POA: Diagnosis not present

## 2014-10-15 LAB — HEPATIC FUNCTION PANEL
ALT: 17 U/L (ref 0–35)
AST: 22 U/L (ref 0–37)
Albumin: 4.4 g/dL (ref 3.5–5.2)
Alkaline Phosphatase: 148 U/L — ABNORMAL HIGH (ref 39–117)
BILIRUBIN DIRECT: 0.1 mg/dL (ref 0.0–0.3)
TOTAL PROTEIN: 7.6 g/dL (ref 6.0–8.3)
Total Bilirubin: 0.5 mg/dL (ref 0.2–1.2)

## 2014-10-15 LAB — TSH: TSH: 2.91 u[IU]/mL (ref 0.35–4.50)

## 2014-10-15 LAB — LIPID PANEL
CHOLESTEROL: 146 mg/dL (ref 0–200)
HDL: 40.7 mg/dL (ref >39.00)
LDL CALC: 85 mg/dL (ref 0–99)
NONHDL: 105.3
TRIGLYCERIDES: 102 mg/dL (ref 0.0–149.0)
Total CHOL/HDL Ratio: 4
VLDL: 20.4 mg/dL (ref 0.0–40.0)

## 2014-10-15 LAB — BASIC METABOLIC PANEL
BUN: 12 mg/dL (ref 6–23)
CALCIUM: 10.1 mg/dL (ref 8.4–10.5)
CO2: 26 mEq/L (ref 19–32)
CREATININE: 0.67 mg/dL (ref 0.40–1.20)
Chloride: 103 mEq/L (ref 96–112)
GFR: 100.47 mL/min (ref >60.00)
GLUCOSE: 90 mg/dL (ref 70–99)
Potassium: 4.4 mEq/L (ref 3.5–5.1)
Sodium: 137 mEq/L (ref 135–145)

## 2014-10-15 NOTE — Telephone Encounter (Signed)
Please reference incident no# [id number] for Short term Disability

## 2014-10-15 NOTE — Assessment & Plan Note (Signed)
History of chest pain with a negative Myoview and recent cardiac catheterization performed 10/03/14 with right radial approach was was entirely normal. Unfortunately, she developed right upper extremity discomfort post procedure and was seen by Rosaria Ferries PA-C in our clinic who ordered a arterial Doppler study which apparently demonstrated radial artery occlusion. Her pain is gradually improving. She has a 2+ right ulnar artery pulse. I'm going to recheck arterial Doppler studies in 3 months I will see her back in 6 months.

## 2014-10-15 NOTE — Progress Notes (Signed)
Patient ID: Brianna Fletcher, female    DOB: Jan 13, 1971  Age: 44 y.o. MRN: [id number]    Subjective:  Subjective HPI CARLEAH YABLONSKI presents for f/u from heart cath which was normal but and small artery was nicked in arm during procedure.  He R arm has been hurting since.  She is seeing cardiology for this.    Review of Systems  Constitutional: Negative for diaphoresis, appetite change, fatigue and unexpected weight change.  Eyes: Negative for pain, redness and visual disturbance.  Respiratory: Negative for cough, chest tightness, shortness of breath and wheezing.   Cardiovascular: Negative for chest pain, palpitations and leg swelling.  Endocrine: Negative for cold intolerance, heat intolerance, polydipsia, polyphagia and polyuria.  Genitourinary: Negative for dysuria, frequency and difficulty urinating.  Musculoskeletal:       + achiness and pain in R arm since cath--- doppler done by cards She has appointment with cards today  Neurological: Negative for dizziness, light-headedness, numbness and headaches.    History Past Medical History  Diagnosis Date  . Glaucoma   . Arthritis   . Leukocytosis, unspecified 11/15/2012  . Chronic neutrophilia 11/15/2012  . Pruritus 11/15/2012  . Monocytosis 11/15/2012  . Myeloproliferative disorder 03/08/2013  . Precordial chest pain   . Family history of heart disease     mother had stents placed in her 9s and had myocardial infarctions  . Radial artery occlusion, right     status post  right radial artery cardiac catheterization    She has past surgical history that includes Tubal ligation (2000); Eye surgery; and Cardiac catheterization (N/A, 10/03/2014).   Her family history includes Cancer in her father; Dementia in her maternal grandmother; Diabetes in her father; Heart disease in her cousin, maternal grandmother, and mother; Heart disease (age of onset: 73) in her maternal grandfather; Hyperlipidemia in her mother; Hypertension in her  mother. There is no history of Other.She reports that she has quit smoking. She started smoking about 8 weeks ago. She has never used smokeless tobacco. She reports that she does not drink alcohol or use illicit drugs.  Current Outpatient Prescriptions on File Prior to Visit  Medication Sig Dispense Refill  . aspirin 81 MG tablet Take 81 mg by mouth daily.    Marland Kitchen doxepin (SINEQUAN) 50 MG capsule TAKE 1 CAPSULE BY MOUTH AT BEDTIME 30 capsule 3  . HYDROcodone-acetaminophen (NORCO/VICODIN) 5-325 MG per tablet Take 1-2 tablets by mouth every 6 (six) hours as needed for moderate pain. 30 tablet 0  . lisinopril-hydrochlorothiazide (PRINZIDE,ZESTORETIC) 10-12.5 MG per tablet Take 1 tablet by mouth daily. 90 tablet 1  . Melatonin 5 MG TABS Take 1 tablet by mouth as needed (sleep).     . mirabegron ER (MYRBETRIQ) 50 MG TB24 tablet Take 1 tablet (50 mg total) by mouth daily. 30 tablet 5  . Omega-3 Fatty Acids (FISH OIL) 1000 MG CAPS Take 1 capsule by mouth every morning.     Marland Kitchen omeprazole (PRILOSEC) 20 MG capsule Take 1 capsule (20 mg total) by mouth daily. 30 capsule 3  . PEGASYS 180 MCG/ML injection Inject 0.45 mLs (81 mcg total) into the skin every 7 (seven) days. 4 mL 3  . Vitamin D, Ergocalciferol, (DRISDOL) 50000 UNITS CAPS capsule Take 50,000 Units by mouth every 7 (seven) days.   1   Current Facility-Administered Medications on File Prior to Visit  Medication Dose Route Frequency Provider Last Rate Last Dose  . acetaminophen (TYLENOL) tablet 650 mg  650 mg Oral Once [employer]  Oletha Cruel, MD   650 mg at 04/26/14 1516  . peginterferon alfa-2a (PEGASYS) injection 81 mcg  81 mcg Subcutaneous Weekly [name], MD   81 mcg at 04/26/14 1530     Objective:  Objective Physical Exam  Constitutional: She is oriented to person, place, and time. She appears well-developed and well-nourished.  HENT:  Head: Normocephalic and atraumatic.  Eyes: Conjunctivae and EOM are normal.  Neck: Normal range of motion.  Neck supple. No JVD present. Carotid bruit is not present. No thyromegaly present.  Cardiovascular: Normal rate, regular rhythm and normal heart sounds.   No murmur heard. Pulmonary/Chest: Effort normal and breath sounds normal. No respiratory distress. She has no wheezes. She has no rales. She exhibits no tenderness.  Musculoskeletal: She exhibits tenderness. She exhibits no edema.       Arms: Neurological: She is alert and oriented to person, place, and time.  Psychiatric: She has a normal mood and affect.   BP 134/82 mmHg  Pulse 78  Temp(Src) 97.8 F (36.6 C) (Oral)  Ht 5\' 7"  (1.702 m)  Wt 227 lb (102.967 kg)  BMI 35.55 kg/m2  SpO2 98%  LMP 08/19/2014 (Approximate) Wt Readings from Last 3 Encounters:  10/15/14 227 lb (102.967 kg)  10/15/14 227 lb (102.967 kg)  10/08/14 227 lb 6.4 oz (103.148 kg)     Lab Results  Component Value Date   WBC 13.7* 10/04/2014   HGB 14.1 10/04/2014   HCT 42.0 10/04/2014   PLT 180 10/04/2014   GLUCOSE 90 10/15/2014   CHOL 146 10/15/2014   TRIG 102.0 10/15/2014   HDL 40.70 10/15/2014   LDLCALC 85 10/15/2014   ALT 17 10/15/2014   AST 22 10/15/2014   NA 137 10/15/2014   K 4.4 10/15/2014   CL 103 10/15/2014   CREATININE 0.67 10/15/2014   BUN 12 10/15/2014   CO2 26 10/15/2014   TSH 2.91 10/15/2014   INR 0.91 10/01/2014    Dg Chest 2 View  10/01/2014   CLINICAL DATA:  Preop for cardiac catheterization, chest pain, arm pain, abnormal stress test  EXAM: CHEST  2 VIEW  COMPARISON:  Chest x-ray of 08/22/2012  FINDINGS: No active infiltrate or effusion is seen. Mediastinal and hilar contours are unremarkable. The heart is within normal limits in size. No bony abnormality is seen.  IMPRESSION: No active cardiopulmonary disease.   Electronically Signed   By: Ivar Drape M.D.   On: 10/01/2014 14:27     Assessment & Plan:  Plan I am having Ms. Gittleman maintain her doxepin, Fish Oil, aspirin, Melatonin, omeprazole, PEGASYS, mirabegron ER,  lisinopril-hydrochlorothiazide, Vitamin D (Ergocalciferol), and HYDROcodone-acetaminophen.  No orders of the defined types were placed in this encounter.    Problem List Items Addressed This Visit    Vitamin D deficiency - Primary   Relevant Orders   Vitamin D 1,25 dihydroxy (Completed)   PTH, Intact and Calcium (Completed)   Hepatic function panel (Completed)   Lipid panel (Completed)    Other Visit Diagnoses    Essential hypertension        Relevant Orders    Vitamin D 1,25 dihydroxy (Completed)    PTH, Intact and Calcium (Completed)    Hepatic function panel (Completed)    Lipid panel (Completed)    Basic metabolic panel (Completed)    Weight gain        Relevant Orders    TSH (Completed)       Follow-up: Return in about 3 weeks (around  11/05/2014), or if symptoms worsen or fail to improve, for hypertension.  Garnet Koyanagi, DO

## 2014-10-15 NOTE — Patient Instructions (Signed)
Your physician has requested that you have a upper extremity arterial duplex in 3 months. This test is an ultrasound of the arteries in the arms. It looks at arterial blood flow in the arms. Allow one hour for Upper Arterial scans. There are no restrictions or special instructions.  Dr Gwenlyn Found recommends that you schedule a follow-up appointment in 6 months. You will receive a reminder letter in the mail two months in advance. If you don't receive a letter, please call our office to schedule the follow-up appointment.

## 2014-10-15 NOTE — Telephone Encounter (Signed)
Brianna Fletcher is calling about when Dr. Gwenlyn Found took Brianna Fletcher out of work . Brianna Fletcher is stating that Dr.Berry took her out of work on 09/30/14. Please call   Thanks

## 2014-10-15 NOTE — Patient Instructions (Signed)

## 2014-10-15 NOTE — Progress Notes (Signed)
10/15/2014 Brianna Fletcher   12-Jun-1970  [id number]  Primary Physician Garnet Koyanagi, DO Primary Cardiologist: Lorretta Harp MD [name]   HPI:  Brianna Fletcher is a 44 year old moderately overweight divorced Caucasian female mother of 2 children who works at [employer] She is accompanied by her boyfriend Brianna Fletcher today. She was referred by her primary care physician, Dr. Garnet Koyanagi , for cardiovascular evaluation because of chest pain and positive risk factors. Her risk factor profile is notable for family history with a mother who has had stents and myocardial infarction in her 81s. She smoked remotely. She is not diabetic. There are no other risk factors. She does have a myeloproliferative disorder and gets weekly interferon shots. [name] also had heavy menstrual periods with anemia requiring transfusion. Six weeks ago she had a prolonged episode of substernal chest pain (10-15 minutes associated with shortness of breath or left upper extremity radiation. Subsequent to the echo was normal and a Myoview stress test showed diaphragmatic attenuation without ischemia. Over the last several days she's had episodes of weakness, diaphoresis nausea and shortness of breath with left upper extremity radiation. Based on this, I decided to proceed with outpatient diagnostic coronary arteriography to define her anatomy and rule out an ischemic etiology. This was performed on 10/03/14 revealing normal coronary arteries and normal LV function. Unfortunately, post procedure she developed right upper extremity discomfort and had duplex evidence of a right radial artery occlusion performed 10/08/14. The symptoms are gradually improving. She has a strong right ulnar pulse.   Current Outpatient Prescriptions  Medication Sig Dispense Refill  . aspirin 81 MG tablet Take 81 mg by mouth daily.    Marland Kitchen doxepin (SINEQUAN) 50 MG capsule TAKE 1 CAPSULE BY MOUTH AT BEDTIME 30 capsule 3  . Flaxseed, Linseed, (FLAX SEED  OIL) 1000 MG CAPS Take 1 capsule by mouth 3 (three) times daily.    Marland Kitchen HYDROcodone-acetaminophen (NORCO/VICODIN) 5-325 MG per tablet Take 1-2 tablets by mouth every 6 (six) hours as needed for moderate pain. 30 tablet 0  . lisinopril-hydrochlorothiazide (PRINZIDE,ZESTORETIC) 10-12.5 MG per tablet Take 1 tablet by mouth daily. 90 tablet 1  . Magnesium 500 MG TABS Take 1 tablet by mouth daily.    . Melatonin 5 MG TABS Take 1 tablet by mouth as needed (sleep).     . mirabegron ER (MYRBETRIQ) 50 MG TB24 tablet Take 1 tablet (50 mg total) by mouth daily. 30 tablet 5  . Omega-3 Fatty Acids (FISH OIL) 1000 MG CAPS Take 1 capsule by mouth every morning.     Marland Kitchen omeprazole (PRILOSEC) 20 MG capsule Take 1 capsule (20 mg total) by mouth daily. 30 capsule 3  . PEGASYS 180 MCG/ML injection Inject 0.45 mLs (81 mcg total) into the skin every 7 (seven) days. 4 mL 3  . Vitamin D, Ergocalciferol, (DRISDOL) 50000 UNITS CAPS capsule Take 50,000 Units by mouth every 7 (seven) days.   1   No current facility-administered medications for this visit.   Facility-Administered Medications Ordered in Other Visits  Medication Dose Route Frequency Provider Last Rate Last Dose  . acetaminophen (TYLENOL) tablet 650 mg  650 mg Oral Once [name], MD   650 mg at 04/26/14 1516  . peginterferon alfa-2a (PEGASYS) injection 81 mcg  81 mcg Subcutaneous Weekly [name], MD   81 mcg at 04/26/14 1530    No Known Allergies  History   Social History  . Marital Status: Divorced    Spouse Name:  N/A  . Number of Children: N/A  . Years of Education: N/A   Occupational History  . Not on file.   Social History Main Topics  . Smoking status: Former Smoker    Start date: 08/21/2014  . Smokeless tobacco: Never Used     Comment: patient reports light tobacco use, has not smoked since friday (09/27/14)  . Alcohol Use: No  . Drug Use: No  . Sexual Activity:    Partners: Male   Other Topics Concern  . Not on file    Social History Narrative     Review of Systems: General: negative for chills, fever, night sweats or weight changes.  Cardiovascular: negative for chest pain, dyspnea on exertion, edema, orthopnea, palpitations, paroxysmal nocturnal dyspnea or shortness of breath Dermatological: negative for rash Respiratory: negative for cough or wheezing Urologic: negative for hematuria Abdominal: negative for nausea, vomiting, diarrhea, bright red blood per rectum, melena, or hematemesis Neurologic: negative for visual changes, syncope, or dizziness All other systems reviewed and are otherwise negative except as noted above.    Blood pressure 128/90, pulse 60, height 5\' 7"  (1.702 m), weight 227 lb (102.967 kg), last menstrual period 08/19/2014.  General appearance: alert and no distress Neck: no adenopathy, no carotid bruit, no JVD, supple, symmetrical, trachea midline and thyroid not enlarged, symmetric, no tenderness/mass/nodules Lungs: clear to auscultation bilaterally Heart: regular rate and rhythm, S1, S2 normal, no murmur, click, rub or gallop Extremities: absent right radial pulse on exam, 2+ right ulnar pulse  EKG not performed today  ASSESSMENT AND PLAN:   Chest pain History of chest pain with a negative Myoview and recent cardiac catheterization performed 10/03/14 with right radial approach was was entirely normal. Unfortunately, she developed right upper extremity discomfort post procedure and was seen by Rosaria Ferries PA-C in our clinic who ordered a arterial Doppler study which apparently demonstrated radial artery occlusion. Her pain is gradually improving. She has a 2+ right ulnar artery pulse. I'm going to recheck arterial Doppler studies in 3 months I will see her back in 6 months.      Lorretta Harp MD FACP,FACC,FAHA, [facility] 10/15/2014 12:25 PM

## 2014-10-15 NOTE — Progress Notes (Signed)
Pre visit review using our clinic review tool, if applicable. No additional management support is needed unless otherwise documented below in the visit note. 

## 2014-10-15 NOTE — Telephone Encounter (Signed)
Lm for Yolanda.

## 2014-10-16 LAB — PTH, INTACT AND CALCIUM
CALCIUM: 10 mg/dL (ref 8.4–10.5)
PTH: 144 pg/mL — AB (ref 14–64)

## 2014-10-16 NOTE — Telephone Encounter (Signed)
Please call and use this number as a Reference #[id number]. Need to know what date pt was taken out of work and when is she suppose to return to work? Also when is her next office,if she is not returning to work.

## 2014-10-17 ENCOUNTER — Telehealth: Payer: Self-pay | Admitting: Cardiovascular Disease

## 2014-10-17 NOTE — Telephone Encounter (Signed)
Calling because she is needing a return to work note . Will be able to pick it up on tomorrow , so that she may return to work on Monday .  Thanks

## 2014-10-17 NOTE — Telephone Encounter (Signed)
Spoke with patent. Note at front desk.

## 2014-10-17 NOTE — Telephone Encounter (Signed)
Lm for Brianna Fletcher that came out of work on 09/27/14 because she was feeling poorly.  Dr Gwenlyn Found saw her on 10/01/14 and she will be out until 10/22/14.

## 2014-10-18 ENCOUNTER — Other Ambulatory Visit: Payer: Self-pay | Admitting: Family Medicine

## 2014-10-18 ENCOUNTER — Other Ambulatory Visit (HOSPITAL_BASED_OUTPATIENT_CLINIC_OR_DEPARTMENT_OTHER): Payer: BLUE CROSS/BLUE SHIELD

## 2014-10-18 DIAGNOSIS — D471 Chronic myeloproliferative disease: Secondary | ICD-10-CM | POA: Diagnosis not present

## 2014-10-18 DIAGNOSIS — E349 Endocrine disorder, unspecified: Secondary | ICD-10-CM

## 2014-10-18 LAB — LACTATE DEHYDROGENASE: LDH: 140 U/L (ref 94–250)

## 2014-10-18 LAB — COMPREHENSIVE METABOLIC PANEL
ALT: 16 U/L (ref 6–29)
AST: 19 U/L (ref 10–35)
Albumin: 4.4 g/dL (ref 3.6–5.1)
Alkaline Phosphatase: 142 U/L — ABNORMAL HIGH (ref 33–115)
BUN: 16 mg/dL (ref 7–25)
CO2: 23 mmol/L (ref 20–31)
CREATININE: 0.63 mg/dL (ref 0.50–1.10)
Calcium: 10.4 mg/dL — ABNORMAL HIGH (ref 8.6–10.2)
Chloride: 100 mmol/L (ref 98–110)
Glucose, Bld: 105 mg/dL — ABNORMAL HIGH (ref 65–99)
POTASSIUM: 4.2 mmol/L (ref 3.5–5.3)
Sodium: 135 mmol/L (ref 135–146)
TOTAL PROTEIN: 7.4 g/dL (ref 6.1–8.1)
Total Bilirubin: 0.5 mg/dL (ref 0.2–1.2)

## 2014-10-18 LAB — CBC WITH DIFFERENTIAL (CANCER CENTER ONLY)
BASO#: 0 10*3/uL (ref 0.0–0.2)
BASO%: 0.1 % (ref 0.0–2.0)
EOS%: 0.2 % (ref 0.0–7.0)
Eosinophils Absolute: 0 10*3/uL (ref 0.0–0.5)
HEMATOCRIT: 39.8 % (ref 34.8–46.6)
HEMOGLOBIN: 13.4 g/dL (ref 11.6–15.9)
LYMPH#: 2.2 10*3/uL (ref 0.9–3.3)
LYMPH%: 10.5 % — AB (ref 14.0–48.0)
MCH: 32.1 pg (ref 26.0–34.0)
MCHC: 33.7 g/dL (ref 32.0–36.0)
MCV: 95 fL (ref 81–101)
MONO#: 1.3 10*3/uL — AB (ref 0.1–0.9)
MONO%: 6.3 % (ref 0.0–13.0)
NEUT#: 17 10*3/uL — ABNORMAL HIGH (ref 1.5–6.5)
NEUT%: 82.9 % — ABNORMAL HIGH (ref 39.6–80.0)
PLATELETS: 219 10*3/uL (ref 145–400)
RBC: 4.17 10*6/uL (ref 3.70–5.32)
RDW: 13.6 % (ref 11.1–15.7)
WBC: 20.5 10*3/uL — AB (ref 3.9–10.0)

## 2014-10-18 LAB — VITAMIN D 1,25 DIHYDROXY
VITAMIN D 1, 25 (OH) TOTAL: 102 pg/mL — AB (ref 18–72)
VITAMIN D2 1, 25 (OH): 42 pg/mL
Vitamin D3 1, 25 (OH)2: 60 pg/mL

## 2014-10-18 LAB — CHCC SATELLITE - SMEAR

## 2014-10-23 ENCOUNTER — Ambulatory Visit: Payer: BLUE CROSS/BLUE SHIELD | Admitting: Cardiovascular Disease

## 2014-10-24 ENCOUNTER — Telehealth: Payer: Self-pay | Admitting: Family Medicine

## 2014-10-24 MED ORDER — LOSARTAN POTASSIUM-HCTZ 50-12.5 MG PO TABS: 1.0000 | ORAL_TABLET | Freq: Every day | ORAL | Status: DC

## 2014-10-24 NOTE — Telephone Encounter (Signed)
Caller name:Lamoyne Relationship to patient:self Can be reached:[phone] Pharmacy:  Reason for call:She went on a new blood pressure medicine about 2 weeks ago.  She feel ok but she has a dry cough that keeps her up at night and affects her all day

## 2014-10-24 NOTE — Telephone Encounter (Signed)
Detailed message left advising to stop the Lisinopril and start the new Rx, it has been sent to [facility].    KP

## 2014-10-24 NOTE — Telephone Encounter (Signed)
Shiquita C Yanes at 10/24/2014 2:28 PM     Status: Signed       Expand All Collapse All   Caller name:Rosmarie Tate Relationship to patient: Self  Can be reached: [phone] Pharmacy:   Reason for call: Pt returned your call. Pt says that she is at work and is unable to answer her phone. Please LVM stating reason for call.

## 2014-10-24 NOTE — Telephone Encounter (Signed)
Caller name:Tasheka Brittingham  Relationship to patient: Self   Can be reached: [phone] Pharmacy:   Reason for call: Pt returned your call. Pt says that she is at work and is unable to answer her phone. Please LVM stating reason for call.

## 2014-10-24 NOTE — Telephone Encounter (Signed)
D/c lisinopril and start hyzaar 50/ 12.5 #30 2 refills 1 po qd  bp check 2-3 weeks

## 2014-10-24 NOTE — Telephone Encounter (Signed)
MSG left to call the office      KP 

## 2014-10-24 NOTE — Telephone Encounter (Signed)
To MD for review     KP 

## 2014-10-25 ENCOUNTER — Ambulatory Visit: Payer: BLUE CROSS/BLUE SHIELD | Admitting: Cardiovascular Disease

## 2014-10-30 ENCOUNTER — Telehealth: Payer: Self-pay | Admitting: Cardiovascular Disease

## 2014-10-30 ENCOUNTER — Encounter (HOSPITAL_COMMUNITY): Payer: Self-pay | Admitting: *Deleted

## 2014-10-30 NOTE — Telephone Encounter (Signed)
Received [employer] Disability Forms back from [employer] @ [facility] for Dr Gwenlyn Found to review, complete and sign.  Forms given to Marylu Lund, RN for Dr Gwenlyn Found to sign. lp

## 2014-10-31 ENCOUNTER — Other Ambulatory Visit (HOSPITAL_BASED_OUTPATIENT_CLINIC_OR_DEPARTMENT_OTHER): Payer: BLUE CROSS/BLUE SHIELD

## 2014-10-31 DIAGNOSIS — D471 Chronic myeloproliferative disease: Secondary | ICD-10-CM

## 2014-10-31 LAB — CBC WITH DIFFERENTIAL (CANCER CENTER ONLY)
BASO#: 0 10*3/uL (ref 0.0–0.2)
BASO%: 0.1 % (ref 0.0–2.0)
EOS ABS: 0.1 10*3/uL (ref 0.0–0.5)
EOS%: 0.3 % (ref 0.0–7.0)
HCT: 38.4 % (ref 34.8–46.6)
HEMOGLOBIN: 12.9 g/dL (ref 11.6–15.9)
LYMPH#: 1.9 10*3/uL (ref 0.9–3.3)
LYMPH%: 11.5 % — ABNORMAL LOW (ref 14.0–48.0)
MCH: 32.4 pg (ref 26.0–34.0)
MCHC: 33.6 g/dL (ref 32.0–36.0)
MCV: 97 fL (ref 81–101)
MONO#: 0.7 10*3/uL (ref 0.1–0.9)
MONO%: 4.3 % (ref 0.0–13.0)
NEUT#: 14.1 10*3/uL — ABNORMAL HIGH (ref 1.5–6.5)
NEUT%: 83.8 % — ABNORMAL HIGH (ref 39.6–80.0)
Platelets: 234 10*3/uL (ref 145–400)
RBC: 3.98 10*6/uL (ref 3.70–5.32)
RDW: 13.3 % (ref 11.1–15.7)
WBC: 16.8 10*3/uL — ABNORMAL HIGH (ref 3.9–10.0)

## 2014-10-31 LAB — COMPREHENSIVE METABOLIC PANEL
ALK PHOS: 147 U/L — AB (ref 33–115)
ALT: 10 U/L (ref 6–29)
AST: 14 U/L (ref 10–35)
Albumin: 4.1 g/dL (ref 3.6–5.1)
BUN: 12 mg/dL (ref 7–25)
CO2: 26 mmol/L (ref 20–31)
CREATININE: 0.71 mg/dL (ref 0.50–1.10)
Calcium: 10.8 mg/dL — ABNORMAL HIGH (ref 8.6–10.2)
Chloride: 100 mmol/L (ref 98–110)
GLUCOSE: 140 mg/dL — AB (ref 65–99)
Potassium: 3.9 mmol/L (ref 3.5–5.3)
Sodium: 135 mmol/L (ref 135–146)
Total Bilirubin: 0.4 mg/dL (ref 0.2–1.2)
Total Protein: 7.4 g/dL (ref 6.1–8.1)

## 2014-11-01 ENCOUNTER — Other Ambulatory Visit: Payer: BLUE CROSS/BLUE SHIELD

## 2014-11-04 ENCOUNTER — Ambulatory Visit (INDEPENDENT_AMBULATORY_CARE_PROVIDER_SITE_OTHER): Payer: BLUE CROSS/BLUE SHIELD | Admitting: Family Medicine

## 2014-11-04 ENCOUNTER — Encounter: Payer: Self-pay | Admitting: Family Medicine

## 2014-11-04 VITALS — BP 118/78 | HR 81 | Temp 98.1°F | Wt 230.6 lb

## 2014-11-04 DIAGNOSIS — R05 Cough: Secondary | ICD-10-CM

## 2014-11-04 DIAGNOSIS — I10 Essential (primary) hypertension: Secondary | ICD-10-CM

## 2014-11-04 DIAGNOSIS — M79631 Pain in right forearm: Secondary | ICD-10-CM

## 2014-11-04 DIAGNOSIS — M79601 Pain in right arm: Secondary | ICD-10-CM

## 2014-11-04 DIAGNOSIS — R059 Cough, unspecified: Secondary | ICD-10-CM

## 2014-11-04 NOTE — Progress Notes (Signed)
Patient ID: Brianna Fletcher, female    DOB: 1970-08-01  Age: 44 y.o. MRN: [id number]    Subjective:  Subjective HPI Brianna Fletcher presents for f/u R arm pain and bp.  Her arm still hurts and cardio ordered another scan for 3 months but she is nervous because it is not improving.  And still hurts-- see last ov, cards ov and art doppler.   Pt with no other complaints.  Her dry cough has improved although is not completely gone.   Review of Systems  Constitutional: Negative for diaphoresis, appetite change, fatigue and unexpected weight change.  Eyes: Negative for pain, redness and visual disturbance.  Respiratory: Positive for cough. Negative for chest tightness, shortness of breath and wheezing.   Cardiovascular: Negative for chest pain, palpitations and leg swelling.  Endocrine: Negative for cold intolerance, heat intolerance, polydipsia, polyphagia and polyuria.  Genitourinary: Negative for dysuria, frequency and difficulty urinating.  Neurological: Negative for dizziness, light-headedness, numbness and headaches.    History Past Medical History  Diagnosis Date  . Glaucoma   . Arthritis   . Leukocytosis, unspecified 11/15/2012  . Chronic neutrophilia 11/15/2012  . Pruritus 11/15/2012  . Monocytosis 11/15/2012  . Myeloproliferative disorder 03/08/2013  . Precordial chest pain   . Family history of heart disease     mother had stents placed in her 47s and had myocardial infarctions  . Radial artery occlusion, right     status post  right radial artery cardiac catheterization  . Blood dyscrasia     produces too many WBCs    She has past surgical history that includes Tubal ligation (2000); Eye surgery; and Cardiac catheterization (N/A, 10/03/2014).   Her family history includes Cancer in her father; Dementia in her maternal grandmother; Diabetes in her father; Heart disease in her cousin, maternal grandmother, and mother; Heart disease (age of onset: 59) in her maternal grandfather;  Hyperlipidemia in her mother; Hypertension in her mother. There is no history of Other.She reports that she has quit smoking. Her smoking use included Cigarettes. She started smoking about 2 months ago. She has never used smokeless tobacco. She reports that she does not drink alcohol or use illicit drugs.  Current Outpatient Prescriptions on File Prior to Visit  Medication Sig Dispense Refill  . aspirin 81 MG tablet Take 81 mg by mouth daily.    Marland Kitchen doxepin (SINEQUAN) 50 MG capsule TAKE 1 CAPSULE BY MOUTH AT BEDTIME 30 capsule 3  . Flaxseed, Linseed, (FLAX SEED OIL) 1000 MG CAPS Take 1 capsule by mouth 3 (three) times daily.    Marland Kitchen HYDROcodone-acetaminophen (NORCO/VICODIN) 5-325 MG per tablet Take 1-2 tablets by mouth every 6 (six) hours as needed for moderate pain. 30 tablet 0  . losartan-hydrochlorothiazide (HYZAAR) 50-12.5 MG per tablet Take 1 tablet by mouth daily. 30 tablet 2  . Magnesium 500 MG TABS Take 1 tablet by mouth daily.    . Melatonin 5 MG TABS Take 1 tablet by mouth as needed (sleep).     . mirabegron ER (MYRBETRIQ) 50 MG TB24 tablet Take 1 tablet (50 mg total) by mouth daily. 30 tablet 5  . Omega-3 Fatty Acids (FISH OIL) 1000 MG CAPS Take 1 capsule by mouth every morning.     Marland Kitchen omeprazole (PRILOSEC) 20 MG capsule Take 1 capsule (20 mg total) by mouth daily. 30 capsule 3  . PEGASYS 180 MCG/ML injection Inject 0.45 mLs (81 mcg total) into the skin every 7 (seven) days. 4 mL 3  . Vitamin  D, Ergocalciferol, (DRISDOL) 50000 UNITS CAPS capsule Take 50,000 Units by mouth every 7 (seven) days.   1   Current Facility-Administered Medications on File Prior to Visit  Medication Dose Route Frequency Provider Last Rate Last Dose  . acetaminophen (TYLENOL) tablet 650 mg  650 mg Oral Once [name], MD   650 mg at 04/26/14 1516  . peginterferon alfa-2a (PEGASYS) injection 81 mcg  81 mcg Subcutaneous Weekly [name], MD   81 mcg at 04/26/14 1530     Objective:  Objective Physical  Exam  Constitutional: She is oriented to person, place, and time. She appears well-developed and well-nourished.  HENT:  Head: Normocephalic and atraumatic.  Eyes: Conjunctivae and EOM are normal.  Neck: Normal range of motion. Neck supple. No JVD present. Carotid bruit is not present. No thyromegaly present.  Cardiovascular: Normal rate, regular rhythm and normal heart sounds.   No murmur heard. Pulmonary/Chest: Effort normal and breath sounds normal. No respiratory distress. She has no wheezes. She has no rales. She exhibits no tenderness.  Musculoskeletal: She exhibits tenderness. She exhibits no edema.       Right elbow: She exhibits swelling. Tenderness found.  Neurological: She is alert and oriented to person, place, and time.  Psychiatric: She has a normal mood and affect. Her behavior is normal.   BP 118/78 mmHg  Pulse 81  Temp(Src) 98.1 F (36.7 C) (Oral)  Wt 230 lb 9.6 oz (104.599 kg)  SpO2 98% Wt Readings from Last 3 Encounters:  11/04/14 230 lb 9.6 oz (104.599 kg)  10/15/14 227 lb (102.967 kg)  10/15/14 227 lb (102.967 kg)     Lab Results  Component Value Date   WBC 16.8* 10/31/2014   HGB 12.9 10/31/2014   HCT 38.4 10/31/2014   PLT 234 10/31/2014   GLUCOSE 140* 10/31/2014   CHOL 146 10/15/2014   TRIG 102.0 10/15/2014   HDL 40.70 10/15/2014   LDLCALC 85 10/15/2014   ALT 10 10/31/2014   AST 14 10/31/2014   NA 135 10/31/2014   K 3.9 10/31/2014   CL 100 10/31/2014   CREATININE 0.71 10/31/2014   BUN 12 10/31/2014   CO2 26 10/31/2014   TSH 2.91 10/15/2014   INR 0.91 10/01/2014    Dg Chest 2 View  10/01/2014   CLINICAL DATA:  Preop for cardiac catheterization, chest pain, arm pain, abnormal stress test  EXAM: CHEST  2 VIEW  COMPARISON:  Chest x-ray of 08/22/2012  FINDINGS: No active infiltrate or effusion is seen. Mediastinal and hilar contours are unremarkable. The heart is within normal limits in size. No bony abnormality is seen.  IMPRESSION: No active  cardiopulmonary disease.   Electronically Signed   By: Ivar Drape M.D.   On: 10/01/2014 14:27     Assessment & Plan:  Plan I am having [name] maintain her doxepin, Fish Oil, aspirin, Melatonin, omeprazole, PEGASYS, mirabegron ER, Vitamin D (Ergocalciferol), HYDROcodone-acetaminophen, Magnesium, Flax Seed Oil, and losartan-hydrochlorothiazide.  No orders of the defined types were placed in this encounter.    Problem List Items Addressed This Visit    Right arm pain - Primary    Repeat doppler today- pt extremely anxious about pain Reassured pt the blood flow was good in other doppler. con't ice/ heat on area rto prn      HTN, goal below 130/80    con't cozaar rto 3 m or sooner prn      Cough    Con' with cozaar -- off  aceI Rto if it does not completely resolve       Other Visit Diagnoses    Right forearm pain        Relevant Orders    VAS [place] UPPER EXTREMITY ARTERIAL DUPLEX       Follow-up: Return in about 3 months (around 02/04/2015) for HTN.  Garnet Koyanagi, DO

## 2014-11-04 NOTE — Progress Notes (Signed)
Pre visit review using our clinic review tool, if applicable. No additional management support is needed unless otherwise documented below in the visit note. 

## 2014-11-04 NOTE — Assessment & Plan Note (Signed)
Stable con't cozaar

## 2014-11-04 NOTE — Assessment & Plan Note (Signed)
Con' with cozaar -- off aceI Rto if it does not completely resolve

## 2014-11-04 NOTE — Progress Notes (Deleted)
Patient ID: Brianna Fletcher, female   DOB: 1970/06/24, 44 y.o.   MRN: [id number]   Subjective:    Patient ID: Brianna Fletcher, female    DOB: [birth year], 44 y.o.   MRN: [id number]  Chief Complaint  Patient presents with  . Hypertension    BP follow up  . Arm Pain    would like to discuss issues with the right arm     HPI Patient is in today for ***  Past Medical History  Diagnosis Date  . Glaucoma   . Arthritis   . Leukocytosis, unspecified 11/15/2012  . Chronic neutrophilia 11/15/2012  . Pruritus 11/15/2012  . Monocytosis 11/15/2012  . Myeloproliferative disorder 03/08/2013  . Precordial chest pain   . Family history of heart disease     mother had stents placed in her 38s and had myocardial infarctions  . Radial artery occlusion, right     status post  right radial artery cardiac catheterization  . Blood dyscrasia     produces too many WBCs    Past Surgical History  Procedure Laterality Date  . Tubal ligation  2000  . Eye surgery      laser eye surgery  . Cardiac catheterization N/A 10/03/2014    Procedure: Left Heart Cath and Coronary Angiography;  Surgeon: Lorretta Harp, MD;  Location: [facility];  Service: Cardiovascular;  Laterality: N/A;    Family History  Problem Relation Age of Onset  . Heart disease Mother   . Hypertension Mother   . Hyperlipidemia Mother   . Diabetes Father   . Cancer Father     esopageal ?  . Dementia Maternal Grandmother   . Heart disease Maternal Grandmother   . Heart disease Maternal Grandfather 36    MI  . Other Neg Hx     hyperparathyroidism  . Heart disease Cousin     Social History   Social History  . Marital Status: Divorced    Spouse Name: N/A  . Number of Children: N/A  . Years of Education: N/A   Occupational History  . Not on file.   Social History Main Topics  . Smoking status: Former Smoker    Types: Cigarettes    Start date: 08/21/2014  . Smokeless tobacco: Never Used     Comment: patient reports  light tobacco use, has not smoked since friday (09/27/14)  . Alcohol Use: No  . Drug Use: No  . Sexual Activity:    Partners: Male   Other Topics Concern  . Not on file   Social History Narrative    Outpatient Prescriptions Prior to Visit  Medication Sig Dispense Refill  . aspirin 81 MG tablet Take 81 mg by mouth daily.    Marland Kitchen doxepin (SINEQUAN) 50 MG capsule TAKE 1 CAPSULE BY MOUTH AT BEDTIME 30 capsule 3  . Flaxseed, Linseed, (FLAX SEED OIL) 1000 MG CAPS Take 1 capsule by mouth 3 (three) times daily.    Marland Kitchen HYDROcodone-acetaminophen (NORCO/VICODIN) 5-325 MG per tablet Take 1-2 tablets by mouth every 6 (six) hours as needed for moderate pain. 30 tablet 0  . losartan-hydrochlorothiazide (HYZAAR) 50-12.5 MG per tablet Take 1 tablet by mouth daily. 30 tablet 2  . Magnesium 500 MG TABS Take 1 tablet by mouth daily.    . Melatonin 5 MG TABS Take 1 tablet by mouth as needed (sleep).     . mirabegron ER (MYRBETRIQ) 50 MG TB24 tablet Take 1 tablet (50 mg total) by mouth daily. Grady  tablet 5  . Omega-3 Fatty Acids (FISH OIL) 1000 MG CAPS Take 1 capsule by mouth every morning.     Marland Kitchen omeprazole (PRILOSEC) 20 MG capsule Take 1 capsule (20 mg total) by mouth daily. 30 capsule 3  . PEGASYS 180 MCG/ML injection Inject 0.45 mLs (81 mcg total) into the skin every 7 (seven) days. 4 mL 3  . Vitamin D, Ergocalciferol, (DRISDOL) 50000 UNITS CAPS capsule Take 50,000 Units by mouth every 7 (seven) days.   1   Facility-Administered Medications Prior to Visit  Medication Dose Route Frequency Provider Last Rate Last Dose  . acetaminophen (TYLENOL) tablet 650 mg  650 mg Oral Once [name], MD   650 mg at 04/26/14 1516  . peginterferon alfa-2a (PEGASYS) injection 81 mcg  81 mcg Subcutaneous Weekly [name], MD   81 mcg at 04/26/14 1530    No Known Allergies  Review of Systems  Constitutional: Negative for fever and malaise/fatigue.  HENT: Negative for congestion.   Eyes: Negative for discharge.    Respiratory: Positive for cough. Negative for shortness of breath.   Cardiovascular: Negative for chest pain, palpitations and leg swelling.  Gastrointestinal: Negative for nausea and abdominal pain.  Genitourinary: Negative for dysuria.  Musculoskeletal: Negative for falls.  Skin: Negative for rash.  Neurological: Negative for loss of consciousness and headaches.  Endo/Heme/Allergies: Negative for environmental allergies.  Psychiatric/Behavioral: Negative for depression. The patient is not nervous/anxious.        Objective:    Physical Exam  BP 118/78 mmHg  Pulse 81  Temp(Src) 98.1 F (36.7 C) (Oral)  Wt 230 lb 9.6 oz (104.599 kg)  SpO2 98% Wt Readings from Last 3 Encounters:  11/04/14 230 lb 9.6 oz (104.599 kg)  10/15/14 227 lb (102.967 kg)  10/15/14 227 lb (102.967 kg)     Lab Results  Component Value Date   WBC 16.8* 10/31/2014   HGB 12.9 10/31/2014   HCT 38.4 10/31/2014   PLT 234 10/31/2014   GLUCOSE 140* 10/31/2014   CHOL 146 10/15/2014   TRIG 102.0 10/15/2014   HDL 40.70 10/15/2014   LDLCALC 85 10/15/2014   ALT 10 10/31/2014   AST 14 10/31/2014   NA 135 10/31/2014   K 3.9 10/31/2014   CL 100 10/31/2014   CREATININE 0.71 10/31/2014   BUN 12 10/31/2014   CO2 26 10/31/2014   TSH 2.91 10/15/2014   INR 0.91 10/01/2014    Lab Results  Component Value Date   TSH 2.91 10/15/2014   Lab Results  Component Value Date   WBC 16.8* 10/31/2014   HGB 12.9 10/31/2014   HCT 38.4 10/31/2014   MCV 97 10/31/2014   PLT 234 10/31/2014   Lab Results  Component Value Date   NA 135 10/31/2014   K 3.9 10/31/2014   CO2 26 10/31/2014   GLUCOSE 140* 10/31/2014   BUN 12 10/31/2014   CREATININE 0.71 10/31/2014   BILITOT 0.4 10/31/2014   ALKPHOS 147* 10/31/2014   AST 14 10/31/2014   ALT 10 10/31/2014   PROT 7.4 10/31/2014   ALBUMIN 4.1 10/31/2014   CALCIUM 10.8* 10/31/2014   GFR 100.47 10/15/2014   Lab Results  Component Value Date   CHOL 146 10/15/2014    Lab Results  Component Value Date   HDL 40.70 10/15/2014   Lab Results  Component Value Date   LDLCALC 85 10/15/2014   Lab Results  Component Value Date   TRIG 102.0 10/15/2014   Lab Results  Component Value Date   CHOLHDL 4 10/15/2014   No results found for: HGBA1C     Assessment & Plan:   Problem List Items Addressed This Visit    None      I am having Ms. Kovacich maintain her doxepin, Fish Oil, aspirin, Melatonin, omeprazole, PEGASYS, mirabegron ER, Vitamin D (Ergocalciferol), HYDROcodone-acetaminophen, Magnesium, Flax Seed Oil, and losartan-hydrochlorothiazide.  No orders of the defined types were placed in this encounter.     Elizabeth Sauer, LPN

## 2014-11-04 NOTE — Assessment & Plan Note (Signed)
con't cozaar rto 3 m or sooner prn

## 2014-11-04 NOTE — Assessment & Plan Note (Signed)
Repeat doppler today- pt extremely anxious about pain Reassured pt the blood flow was good in other doppler. con't ice/ heat on area rto prn

## 2014-11-04 NOTE — Patient Instructions (Signed)

## 2014-11-05 ENCOUNTER — Ambulatory Visit: Payer: BLUE CROSS/BLUE SHIELD | Admitting: Family Medicine

## 2014-11-05 NOTE — Anesthesia Preprocedure Evaluation (Addendum)
Anesthesia Evaluation  Patient identified by MRN, date of birth, ID band Patient awake    Reviewed: Allergy & Precautions, NPO status , Patient's Chart, lab work & pertinent test results  Airway Mallampati: II   Neck ROM: Full    Dental  (+) Dental Advisory Given, Teeth Intact   Pulmonary former smoker,  Quit smoking 09/2014, may be HAVING OCCASIONAL SMOKES breath sounds clear to auscultation        Cardiovascular hypertension, Rhythm:Regular  Cath 10/03/2014 Normal EF 60%, EKG 09/2014 OK, Right radial artery occlusion , Has been evaluated by cardiology and was felt to have non cardiac chest pain   Neuro/Psych negative psych ROS   GI/Hepatic   Endo/Other    Renal/GU      Musculoskeletal   Abdominal (+)  Abdomen: soft.    Peds  Hematology 13/34   Anesthesia Other Findings   Reproductive/Obstetrics                          Anesthesia Physical Anesthesia Plan  ASA: II  Anesthesia Plan: General   Post-op Pain Management:    Induction: Intravenous  Airway Management Planned: LMA and Oral ETT  Additional Equipment:   Intra-op Plan:   Post-operative Plan:   Informed Consent: I have reviewed the patients History and Physical, chart, labs and discussed the procedure including the risks, benefits and alternatives for the proposed anesthesia with the patient or authorized representative who has indicated his/her understanding and acceptance.     Plan Discussed with:   Anesthesia Plan Comments: (NO BP on Right side, Right radial artery oclusion)       Anesthesia Quick Evaluation

## 2014-11-06 ENCOUNTER — Telehealth: Payer: Self-pay | Admitting: Cardiovascular Disease

## 2014-11-06 NOTE — Telephone Encounter (Signed)
8.16.16 Received completed and signed [employer] disability forms back from Dr Gwenlyn Found.  Contacted patient and advised forms completed and signed and ready to pick up. lp

## 2014-11-07 ENCOUNTER — Ambulatory Visit (HOSPITAL_COMMUNITY)
Admission: RE | Admit: 2014-11-07 | Discharge: 2014-11-07 | Disposition: A | Discharge status: Home / Self Care | Payer: BLUE CROSS/BLUE SHIELD | Source: Ambulatory Visit | Attending: Family Medicine | Admitting: Family Medicine

## 2014-11-07 DIAGNOSIS — M79631 Pain in right forearm: Secondary | ICD-10-CM | POA: Diagnosis not present

## 2014-11-07 DIAGNOSIS — I748 Embolism and thrombosis of other arteries: Secondary | ICD-10-CM | POA: Diagnosis not present

## 2014-11-07 NOTE — Progress Notes (Signed)
*  PRELIMINARY RESULTS* Vascular Ultrasound Upper Extremity Arterial Duplex has been completed.  Preliminary findings:   Right radial artery occlusion, proximal to distal, appears unchanged from previous study on 10/08/14. Triphasic flow noted in the right brachial and ulnar arteries. Images were obtained of area of patient's concern, right AC fossa, and demonstrated patent veins.   Landry Mellow, RDMS, RVT  11/07/2014, 2:56 PM

## 2014-11-08 ENCOUNTER — Ambulatory Visit (HOSPITAL_COMMUNITY): Payer: BLUE CROSS/BLUE SHIELD | Admitting: Anesthesiology

## 2014-11-08 ENCOUNTER — Ambulatory Visit (HOSPITAL_COMMUNITY)
Admission: RE | Admit: 2014-11-08 | Discharge: 2014-11-08 | Disposition: A | Discharge status: Home / Self Care | Payer: BLUE CROSS/BLUE SHIELD | Source: Ambulatory Visit | Attending: Obstetrics & Gynecology | Admitting: Obstetrics & Gynecology

## 2014-11-08 ENCOUNTER — Encounter (HOSPITAL_COMMUNITY)
Admission: RE | Disposition: A | Discharge status: Home / Self Care | Payer: Self-pay | Source: Ambulatory Visit | Attending: Obstetrics & Gynecology

## 2014-11-08 ENCOUNTER — Encounter (HOSPITAL_COMMUNITY): Payer: Self-pay | Admitting: *Deleted

## 2014-11-08 DIAGNOSIS — N938 Other specified abnormal uterine and vaginal bleeding: Secondary | ICD-10-CM | POA: Insufficient documentation

## 2014-11-08 DIAGNOSIS — N920 Excessive and frequent menstruation with regular cycle: Secondary | ICD-10-CM | POA: Diagnosis not present

## 2014-11-08 DIAGNOSIS — I10 Essential (primary) hypertension: Secondary | ICD-10-CM | POA: Insufficient documentation

## 2014-11-08 DIAGNOSIS — Z87891 Personal history of nicotine dependence: Secondary | ICD-10-CM | POA: Diagnosis not present

## 2014-11-08 HISTORY — PX: DILITATION & CURRETTAGE/HYSTROSCOPY WITH NOVASURE ABLATION: SHX5568

## 2014-11-08 HISTORY — DX: Disease of blood and blood-forming organs, unspecified: D75.9

## 2014-11-08 LAB — CBC
HCT: 39 % (ref 36.0–46.0)
Hemoglobin: 12.7 g/dL (ref 12.0–15.0)
MCH: 31.2 pg (ref 26.0–34.0)
MCHC: 32.6 g/dL (ref 30.0–36.0)
MCV: 95.8 fL (ref 78.0–100.0)
PLATELETS: 245 10*3/uL (ref 150–400)
RBC: 4.07 MIL/uL (ref 3.87–5.11)
RDW: 14 % (ref 11.5–15.5)
WBC: 17.9 10*3/uL — ABNORMAL HIGH (ref 4.0–10.5)

## 2014-11-08 LAB — TYPE AND SCREEN
ABO/RH(D): A POS
ANTIBODY SCREEN: NEGATIVE

## 2014-11-08 LAB — ABO/RH: ABO/RH(D): A POS

## 2014-11-08 LAB — PREGNANCY, URINE: Preg Test, Ur: NEGATIVE

## 2014-11-08 SURGERY — DILATATION & CURETTAGE/HYSTEROSCOPY WITH NOVASURE ABLATION
Anesthesia: General | Site: Vagina

## 2014-11-08 MED ORDER — IBUPROFEN 800 MG PO TABS: 800.0000 mg | ORAL_TABLET | Freq: Three times a day (TID) | ORAL | Status: DC | PRN

## 2014-11-08 MED ORDER — OXYCODONE-ACETAMINOPHEN 7.5-325 MG PO TABS: 1.0000 | ORAL_TABLET | ORAL | Status: DC | PRN

## 2014-11-08 MED ORDER — LIDOCAINE HCL 1 % IJ SOLN
INTRAMUSCULAR | Status: AC
  Filled 2014-11-08: qty 20

## 2014-11-08 MED ORDER — SCOPOLAMINE 1 MG/3DAYS TD PT72
MEDICATED_PATCH | TRANSDERMAL | Status: DC
Start: 2014-11-08 — End: 2014-11-08
  Filled 2014-11-08: qty 1

## 2014-11-08 MED ORDER — DEXAMETHASONE SODIUM PHOSPHATE 4 MG/ML IJ SOLN
INTRAMUSCULAR | Status: DC | PRN
  Administered 2014-11-08: 10 mg via INTRAVENOUS

## 2014-11-08 MED ORDER — FENTANYL CITRATE (PF) 250 MCG/5ML IJ SOLN
INTRAMUSCULAR | Status: AC
  Filled 2014-11-08: qty 25

## 2014-11-08 MED ORDER — ONDANSETRON HCL 4 MG/2ML IJ SOLN
INTRAMUSCULAR | Status: AC
  Filled 2014-11-08: qty 2

## 2014-11-08 MED ORDER — MIDAZOLAM HCL 5 MG/5ML IJ SOLN
INTRAMUSCULAR | Status: DC | PRN
  Administered 2014-11-08: 2 mg via INTRAVENOUS

## 2014-11-08 MED ORDER — DEXAMETHASONE SODIUM PHOSPHATE 10 MG/ML IJ SOLN
INTRAMUSCULAR | Status: AC
  Filled 2014-11-08: qty 1

## 2014-11-08 MED ORDER — MIDAZOLAM HCL 2 MG/2ML IJ SOLN
INTRAMUSCULAR | Status: AC
  Filled 2014-11-08: qty 4

## 2014-11-08 MED ORDER — CEFAZOLIN SODIUM-DEXTROSE 2-3 GM-% IV SOLR
INTRAVENOUS | Status: DC | PRN
  Administered 2014-11-08: 2 g via INTRAVENOUS

## 2014-11-08 MED ORDER — LIDOCAINE HCL (CARDIAC) 20 MG/ML IV SOLN
INTRAVENOUS | Status: AC
  Filled 2014-11-08: qty 5

## 2014-11-08 MED ORDER — LACTATED RINGERS IR SOLN
Status: DC | PRN
  Administered 2014-11-08: 3000 mL

## 2014-11-08 MED ORDER — PROPOFOL 10 MG/ML IV BOLUS
INTRAVENOUS | Status: AC
  Filled 2014-11-08: qty 20

## 2014-11-08 MED ORDER — LIDOCAINE HCL (CARDIAC) 20 MG/ML IV SOLN
INTRAVENOUS | Status: DC | PRN
  Administered 2014-11-08: 100 mg via INTRAVENOUS

## 2014-11-08 MED ORDER — DEXTROSE IN LACTATED RINGERS 5 % IV SOLN: INTRAVENOUS | Status: DC

## 2014-11-08 MED ORDER — PROPOFOL 10 MG/ML IV BOLUS
INTRAVENOUS | Status: DC | PRN
  Administered 2014-11-08: 200 mg via INTRAVENOUS

## 2014-11-08 MED ORDER — ONDANSETRON HCL 4 MG/2ML IJ SOLN
INTRAMUSCULAR | Status: DC | PRN
  Administered 2014-11-08: 4 mg via INTRAVENOUS

## 2014-11-08 MED ORDER — SCOPOLAMINE 1 MG/3DAYS TD PT72: 1.0000 | MEDICATED_PATCH | Freq: Once | TRANSDERMAL | Status: DC

## 2014-11-08 MED ORDER — LACTATED RINGERS IV SOLN
INTRAVENOUS | Status: DC
  Administered 2014-11-08 (×2): via INTRAVENOUS

## 2014-11-08 MED ORDER — LIDOCAINE HCL 1 % IJ SOLN
INTRAMUSCULAR | Status: DC | PRN
  Administered 2014-11-08: 10 mL

## 2014-11-08 MED ORDER — FENTANYL CITRATE (PF) 100 MCG/2ML IJ SOLN
INTRAMUSCULAR | Status: DC | PRN
  Administered 2014-11-08 (×4): 50 ug via INTRAVENOUS

## 2014-11-08 SURGICAL SUPPLY — 14 items
ABLATOR ENDOMETRIAL BIPOLAR (ABLATOR) ×2 IMPLANT
CATH ROBINSON RED A/P 16FR (CATHETERS) ×2 IMPLANT
CLOTH BEACON ORANGE TIMEOUT ST (SAFETY) ×2 IMPLANT
CONTAINER PREFILL 10% NBF 60ML (FORM) IMPLANT
GLOVE BIO SURGEON STRL SZ 6 (GLOVE) ×2 IMPLANT
GLOVE BIOGEL PI IND STRL 6 (GLOVE) ×2 IMPLANT
GLOVE BIOGEL PI INDICATOR 6 (GLOVE) ×2
GOWN STRL REUS W/TWL LRG LVL3 (GOWN DISPOSABLE) ×4 IMPLANT
PACK VAGINAL MINOR WOMEN LF (CUSTOM PROCEDURE TRAY) ×2 IMPLANT
PAD OB MATERNITY 4.3X12.25 (PERSONAL CARE ITEMS) ×2 IMPLANT
TOWEL OR 17X24 6PK STRL BLUE (TOWEL DISPOSABLE) ×4 IMPLANT
TUBING AQUILEX INFLOW (TUBING) ×2 IMPLANT
TUBING AQUILEX OUTFLOW (TUBING) ×2 IMPLANT
WATER STERILE IRR 1000ML POUR (IV SOLUTION) ×2 IMPLANT

## 2014-11-08 NOTE — Transfer of Care (Signed)
Immediate Anesthesia Transfer of Care Note  Patient: Brianna Fletcher  Procedure(s) Performed: Procedure(s): DILATATION & CURETTAGE/HYSTEROSCOPY WITH NOVASURE ABLATION (N/A)  Patient Location: PACU  Anesthesia Type:General  Level of Consciousness: awake, alert  and oriented  Airway & Oxygen Therapy: Patient Spontanous Breathing and Patient connected to nasal cannula oxygen  Post-op Assessment: Report given to RN and Post -op Vital signs reviewed and stable  Post vital signs: Reviewed and stable  Last Vitals:  Filed Vitals:   11/08/14 0823  BP: 120/58  Pulse: 69  Temp: 36.3 C  Resp: 20    Complications: No apparent anesthesia complications

## 2014-11-08 NOTE — Anesthesia Postprocedure Evaluation (Signed)
  Anesthesia Post-op Note  Patient: Brianna Fletcher  Procedure(s) Performed: Procedure(s): DILATATION & CURETTAGE/HYSTEROSCOPY WITH NOVASURE ABLATION (N/A)  Patient Location: PACU  Anesthesia Type:General  Level of Consciousness: awake, alert  and oriented  Airway and Oxygen Therapy: Patient Spontanous Breathing and Patient connected to nasal cannula oxygen  Post-op Pain: none  Post-op Assessment: Post-op Vital signs reviewed and Patient's Cardiovascular Status Stable              Post-op Vital Signs: Reviewed and stable  Last Vitals:  Filed Vitals:   11/08/14 0900  BP: 118/63  Pulse: 61  Temp:   Resp: 21    Complications: No apparent anesthesia complications

## 2014-11-08 NOTE — Anesthesia Postprocedure Evaluation (Signed)
  Anesthesia Post-op Note  Patient: Brianna Fletcher  Procedure(s) Performed: Procedure(s): DILATATION & CURETTAGE/HYSTEROSCOPY WITH NOVASURE ABLATION (N/A)  Patient Location: PACU  Anesthesia Type:General  Level of Consciousness: awake, alert  and oriented  Airway and Oxygen Therapy: Patient Spontanous Breathing  Post-op Pain: mild  Post-op Assessment: Post-op Vital signs reviewed, Patient's Cardiovascular Status Stable, Respiratory Function Stable, RESPIRATORY FUNCTION UNSTABLE and No signs of Nausea or vomiting              Post-op Vital Signs: Reviewed and stable  Last Vitals:  Filed Vitals:   11/08/14 0823  BP: 120/58  Pulse: 69  Temp: 36.3 C  Resp: 20    Complications: No apparent anesthesia complications

## 2014-11-08 NOTE — Anesthesia Postprocedure Evaluation (Signed)
  Anesthesia Post-op Note  Patient: Brianna Fletcher  Procedure(s) Performed: Procedure(s): DILATATION & CURETTAGE/HYSTEROSCOPY WITH NOVASURE ABLATION (N/A)  Patient Location: PACU  Anesthesia Type:General  Level of Consciousness: awake, alert  and oriented  Airway and Oxygen Therapy: Patient Spontanous Breathing and Patient connected to nasal cannula oxygen  Post-op Pain: none  Post-op Assessment: Post-op Vital signs reviewed and Patient's Cardiovascular Status Stable              Post-op Vital Signs: Reviewed and stable  Last Vitals:  Filed Vitals:   11/08/14 0915  BP: 114/61  Pulse: 56  Temp:   Resp: 12    Complications: No apparent anesthesia complications

## 2014-11-08 NOTE — Discharge Instructions (Signed)
Call MD for T>100.4, heavy vaginal bleeding, severe abdominal pain, intractable nausea and/or vomiting, or respiratory distress.  Call office to schedule postop appointment in 2 weeks.  No driving while taking narcotics.  Pelvic rest x 2 weeks.  DISCHARGE INSTRUCTIONS: D&C / D&E The following instructions have been prepared to help you care for yourself upon your return home.   Personal hygiene:  Use sanitary pads for vaginal drainage, not tampons.  Shower the day after your procedure.  NO tub baths, pools or Jacuzzis for 2-3 weeks.  Wipe front to back after using the bathroom.  Activity and limitations:  Do NOT drive or operate any equipment for 24 hours. The effects of anesthesia are still present and drowsiness may result.  Do NOT rest in bed all day.  Walking is encouraged.  Walk up and down stairs slowly.  You may resume your normal activity in one to two days or as indicated by your physician.  Sexual activity: NO intercourse for at least 2 weeks after the procedure, or as indicated by your physician.  Diet: Eat a light meal as desired this evening. You may resume your usual diet tomorrow.  Return to work: You may resume your work activities in one to two days or as indicated by your doctor.  What to expect after your surgery: Expect to have vaginal bleeding/discharge for 2-3 days and spotting for up to 10 days. It is not unusual to have soreness for up to 1-2 weeks. You may have a slight burning sensation when you urinate for the first day. Mild cramps may continue for a couple of days. You may have a regular period in 2-6 weeks.  Call your doctor for any of the following:  Excessive vaginal bleeding, saturating and changing one pad every hour.  Inability to urinate 6 hours after discharge from hospital.  Pain not relieved by pain medication.  Fever of 100.4 F or greater.  Unusual vaginal discharge or odor.   Call for an appointment:    Patients signature:  ______________________  Nurses signature ________________________  Support person's signature_______________________

## 2014-11-08 NOTE — Progress Notes (Signed)
No change to H&P.  Brianna Macmullen, DO 

## 2014-11-08 NOTE — Op Note (Signed)
PREOPERATIVE DIAGNOSIS:  44 y.o. heavy menstrual bleeding  POSTOPERATIVE DIAGNOSIS: The same  PROCEDURE: Hysteroscopy, Dilation and Curettage, Novasure  SURGEON:  Dr. Linda Hedges  INDICATIONS: 44 y.o. here for scheduled surgery for treatment and diagnosis of heavy menstrual bleeding.   Risks of surgery were discussed with the patient including but not limited to: bleeding which may require transfusion; infection which may require antibiotics; injury to uterus or surrounding organs; intrauterine scarring which may impair future fertility; need for additional procedures including laparotomy or laparoscopy; and other postoperative/anesthesia complications. Written informed consent was obtained.    FINDINGS:  A 8 week size uterus.  Diffuse proliferative endometrium.  Normal ostia bilaterally.  Cavity length 4.5cm; width 2.9 cm.    ANESTHESIA:   General, paracervical block  ESTIMATED BLOOD LOSS:  50cc  SPECIMENS: Endometrial curettings sent to pathology  COMPLICATIONS:  None immediate.  PROCEDURE DETAILS:  The patient received intravenous antibiotics while in the preoperative area.  She was then taken to the operating room where general anesthesia was administered and was found to be adequate.  After an adequate timeout was performed, she was placed in the dorsal lithotomy position and examined; then prepped and draped in the sterile manner.   Her bladder was catheterized for an unmeasured amount of clear, yellow urine. A speculum was then placed in the patient's vagina and a single tooth tenaculum was applied to the anterior lip of the cervix.   A paracervical block using 0.5% Marcaine was administered.  The uteus was sounded to 9 cm and dilated manually with metal dilators to accommodate the 5.5 mm diagnostic hysteroscope.  Once the cervix was dilated, the hysteroscope was inserted under direct visualization using saline as a suspension medium.  The uterine cavity was carefully examined, both ostia  were recognized.   Fluffy endometrial tissue was noted.  After further careful visualization of the uterine cavity, the hysteroscope was removed under direct visualization taking not of cervical length on scope removal, 4.5cm.  A sharp curettage was then performed to obtain a moderate amount of endometrial curettings. The Novasure was opened and advanced into the uterus without complication.  After the test cycle was performed, the procedure was performed without complication for a total of 110s. The tenaculum was removed from the anterior lip of the cervix and the vaginal speculum was removed after noting good hemostasis.  The patient tolerated the procedure well and was taken to the recovery area awake, extubated and in stable condition.

## 2014-11-11 ENCOUNTER — Ambulatory Visit (INDEPENDENT_AMBULATORY_CARE_PROVIDER_SITE_OTHER): Payer: BLUE CROSS/BLUE SHIELD | Admitting: Endocrinology

## 2014-11-11 ENCOUNTER — Encounter: Payer: Self-pay | Admitting: Endocrinology

## 2014-11-11 VITALS — BP 118/70 | HR 65 | Temp 97.5°F | Ht 67.0 in | Wt 231.0 lb

## 2014-11-11 DIAGNOSIS — E213 Hyperparathyroidism, unspecified: Secondary | ICD-10-CM

## 2014-11-11 NOTE — Addendum Note (Signed)
Addendum  created 11/11/14 1405 by Laverle Hobby, CRNA   Modules edited: Charges VN

## 2014-11-11 NOTE — Progress Notes (Signed)
Subjective:    Patient ID: Brianna Fletcher, female    DOB: 1970-11-03, 44 y.o.   MRN: [id number]  HPI  The state of at least three ongoing medical problems is addressed today, with interval history of each noted here: Pt returns for f/u hyperparathyroidism (dx'ed early 2016; prob a combination of primary and secondary; and has stated taking rx for this; she has never had urolithiasis or bony fracture).  She denies constipation.  Vit-D deficiency,she has completed a course of vitamin-d supplementation.  Leg cramps persist. HTN: she has been started on hyzaar.  No edema.     Past Medical History  Diagnosis Date  . Glaucoma   . Arthritis   . Leukocytosis, unspecified 11/15/2012  . Chronic neutrophilia 11/15/2012  . Pruritus 11/15/2012  . Monocytosis 11/15/2012  . Myeloproliferative disorder 03/08/2013  . Precordial chest pain   . Family history of heart disease     mother had stents placed in her 35s and had myocardial infarctions  . Radial artery occlusion, right     status post  right radial artery cardiac catheterization  . Blood dyscrasia     produces too many WBCs  . Vaginal delivery 1986, 2000    Past Surgical History  Procedure Laterality Date  . Tubal ligation  2000  . Eye surgery      laser eye surgery  . Cardiac catheterization N/A 10/03/2014    Procedure: Left Heart Cath and Coronary Angiography;  Surgeon: Lorretta Harp, MD;  Location: [facility];  Service: Cardiovascular;  Laterality: N/A;  . Dilitation & currettage/hystroscopy with novasure ablation N/A 11/08/2014    Procedure: DILATATION & CURETTAGE/HYSTEROSCOPY WITH NOVASURE ABLATION;  Surgeon: Linda Hedges, DO;  Location: [facility];  Service: Gynecology;  Laterality: N/A;    Social History   Social History  . Marital Status: Divorced    Spouse Name: N/A  . Number of Children: N/A  . Years of Education: N/A   Occupational History  . Not on file.   Social History Main Topics  . Smoking status: Former  Smoker    Types: Cigarettes    Start date: 08/21/2014  . Smokeless tobacco: Never Used     Comment: patient reports light tobacco use, has not smoked since friday (09/27/14)  . Alcohol Use: No  . Drug Use: No  . Sexual Activity:    Partners: Male   Other Topics Concern  . Not on file   Social History Narrative    Current Outpatient Prescriptions on File Prior to Visit  Medication Sig Dispense Refill  . aspirin 81 MG tablet Take 81 mg by mouth daily.    Marland Kitchen doxepin (SINEQUAN) 50 MG capsule TAKE 1 CAPSULE BY MOUTH AT BEDTIME 30 capsule 3  . Flaxseed, Linseed, (FLAX SEED OIL) 1000 MG CAPS Take 1 capsule by mouth 3 (three) times daily.    Marland Kitchen ibuprofen (ADVIL,MOTRIN) 800 MG tablet Take 1 tablet (800 mg total) by mouth every 8 (eight) hours as needed. 30 tablet 0  . losartan-hydrochlorothiazide (HYZAAR) 50-12.5 MG per tablet Take 1 tablet by mouth daily. 30 tablet 2  . Magnesium 500 MG TABS Take 1 tablet by mouth daily.    . Melatonin 5 MG TABS Take 1 tablet by mouth as needed (sleep).     . mirabegron ER (MYRBETRIQ) 50 MG TB24 tablet Take 1 tablet (50 mg total) by mouth daily. 30 tablet 5  . Omega-3 Fatty Acids (FISH OIL) 1000 MG CAPS Take 1 capsule by  mouth every morning.     Marland Kitchen omeprazole (PRILOSEC) 20 MG capsule Take 1 capsule (20 mg total) by mouth daily. 30 capsule 3  . oxyCODONE-acetaminophen (PERCOCET) 7.5-325 MG per tablet Take 1 tablet by mouth every 4 (four) hours as needed for severe pain. 15 tablet 0  . PEGASYS 180 MCG/ML injection Inject 0.45 mLs (81 mcg total) into the skin every 7 (seven) days. 4 mL 3  . Vitamin D, Ergocalciferol, (DRISDOL) 50000 UNITS CAPS capsule Take 50,000 Units by mouth every 7 (seven) days.   1   Current Facility-Administered Medications on File Prior to Visit  Medication Dose Route Frequency Provider Last Rate Last Dose  . acetaminophen (TYLENOL) tablet 650 mg  650 mg Oral Once [name], MD   650 mg at 04/26/14 1516  . peginterferon alfa-2a  (PEGASYS) injection 81 mcg  81 mcg Subcutaneous Weekly [name], MD   81 mcg at 04/26/14 1530    No Known Allergies  Family History  Problem Relation Age of Onset  . Heart disease Mother   . Hypertension Mother   . Hyperlipidemia Mother   . Diabetes Father   . Cancer Father     esopageal ?  . Dementia Maternal Grandmother   . Heart disease Maternal Grandmother   . Heart disease Maternal Grandfather 67    MI  . Other Neg Hx     hyperparathyroidism  . Heart disease Cousin     BP 118/70 mmHg  Pulse 65  Temp(Src) 97.5 F (36.4 C) (Oral)  Ht 5\' 7"  (1.702 m)  Wt 231 lb (104.781 kg)  BMI 36.17 kg/m2  SpO2 96%  Review of Systems She has weight gain.  Denies sob.      Objective:   Physical Exam VITAL SIGNS:  See vs page GENERAL: no distress Ext: no edema.  Lab Results  Component Value Date   PTH 67* 11/11/2014   CALCIUM 10.4 11/11/2014      Assessment & Plan:  Vitamin-D deficiency, slightly worse Hypercalcemia, resolved Hyperparathyroidism, probably very mild primary hyperPTH.  We'll follow HTN: in view of normal Ca++, she can continue hyzaar for now.   Patient is advised the following: Patient Instructions  blood tests are requested for you today.  We'll let you know about the results.   If the PTH is high, and vitamin-D and calcium levels are normal, all we need to do is to recheck the PTH in the future.   Please come back for a follow-up appointment in 6 months, when you'll be due for another thyroid ultrasound, also.     addendum: take vit-D, 1000 units/day

## 2014-11-11 NOTE — Patient Instructions (Addendum)
blood tests are requested for you today.  We'll let you know about the results.   If the PTH is high, and vitamin-D and calcium levels are normal, all we need to do is to recheck the PTH in the future.   Please come back for a follow-up appointment in 6 months, when you'll be due for another thyroid ultrasound, also.

## 2014-11-11 NOTE — Addendum Note (Signed)
Addendum  created 11/11/14 1645 by Laverle Hobby, CRNA   Modules edited: Charges VN

## 2014-11-12 LAB — VITAMIN D 25 HYDROXY (VIT D DEFICIENCY, FRACTURES): VITD: 26.53 ng/mL — AB (ref 30.00–100.00)

## 2014-11-13 ENCOUNTER — Encounter (HOSPITAL_COMMUNITY): Payer: Self-pay | Admitting: Obstetrics & Gynecology

## 2014-11-13 ENCOUNTER — Telehealth: Payer: Self-pay | Admitting: Family Medicine

## 2014-11-13 ENCOUNTER — Other Ambulatory Visit (HOSPITAL_BASED_OUTPATIENT_CLINIC_OR_DEPARTMENT_OTHER): Payer: BLUE CROSS/BLUE SHIELD

## 2014-11-13 DIAGNOSIS — D471 Chronic myeloproliferative disease: Secondary | ICD-10-CM

## 2014-11-13 DIAGNOSIS — I70208 Unspecified atherosclerosis of native arteries of extremities, other extremity: Secondary | ICD-10-CM

## 2014-11-13 LAB — CBC WITH DIFFERENTIAL (CANCER CENTER ONLY)
BASO#: 0 10*3/uL (ref 0.0–0.2)
BASO%: 0.1 % (ref 0.0–2.0)
EOS ABS: 0.1 10*3/uL (ref 0.0–0.5)
EOS%: 0.3 % (ref 0.0–7.0)
HCT: 36.7 % (ref 34.8–46.6)
HEMOGLOBIN: 12.4 g/dL (ref 11.6–15.9)
LYMPH#: 2.1 10*3/uL (ref 0.9–3.3)
LYMPH%: 10.9 % — AB (ref 14.0–48.0)
MCH: 32.5 pg (ref 26.0–34.0)
MCHC: 33.8 g/dL (ref 32.0–36.0)
MCV: 96 fL (ref 81–101)
MONO#: 0.9 10*3/uL (ref 0.1–0.9)
MONO%: 4.8 % (ref 0.0–13.0)
NEUT%: 83.9 % — ABNORMAL HIGH (ref 39.6–80.0)
NEUTROS ABS: 15.7 10*3/uL — AB (ref 1.5–6.5)
PLATELETS: 213 10*3/uL (ref 145–400)
RBC: 3.82 10*6/uL (ref 3.70–5.32)
RDW: 13.5 % (ref 11.1–15.7)
WBC: 18.8 10*3/uL — AB (ref 3.9–10.0)

## 2014-11-13 LAB — PTH, INTACT AND CALCIUM
CALCIUM: 10.4 mg/dL (ref 8.4–10.5)
PTH: 67 pg/mL — AB (ref 14–64)

## 2014-11-13 LAB — COMPREHENSIVE METABOLIC PANEL
ALBUMIN: 3.9 g/dL (ref 3.6–5.1)
ALK PHOS: 121 U/L — AB (ref 33–115)
ALT: 9 U/L (ref 6–29)
AST: 12 U/L (ref 10–35)
BUN: 13 mg/dL (ref 7–25)
CO2: 26 mmol/L (ref 20–31)
CREATININE: 0.55 mg/dL (ref 0.50–1.10)
Calcium: 10.1 mg/dL (ref 8.6–10.2)
Chloride: 101 mmol/L (ref 98–110)
Glucose, Bld: 108 mg/dL — ABNORMAL HIGH (ref 65–99)
Potassium: 3.7 mmol/L (ref 3.5–5.3)
SODIUM: 135 mmol/L (ref 135–146)
TOTAL PROTEIN: 6.7 g/dL (ref 6.1–8.1)
Total Bilirubin: 0.4 mg/dL (ref 0.2–1.2)

## 2014-11-13 NOTE — Telephone Encounter (Signed)
Pt came by office. Thought you were going to refer her to a vascular surgeon if she still has the blockage?  Please advise.

## 2014-11-13 NOTE — Telephone Encounter (Signed)
Pt returning call. Cannot answer phone at work. Please leave detailed msg [phone]. She will call back if needed at her break.

## 2014-11-13 NOTE — Telephone Encounter (Signed)
See arterial duplex result note.

## 2014-11-13 NOTE — Telephone Encounter (Signed)
Is she returning your call?

## 2014-11-14 ENCOUNTER — Other Ambulatory Visit: Payer: Self-pay | Admitting: Family Medicine

## 2014-11-14 ENCOUNTER — Other Ambulatory Visit: Payer: BLUE CROSS/BLUE SHIELD

## 2014-11-14 NOTE — Telephone Encounter (Signed)
I need a diagnosis code.      KP

## 2014-11-14 NOTE — Telephone Encounter (Signed)
Is it no better ?  Because the area she is having pain is completely normal----the area where blockage is is not whats bothering her.  Dx venous occlusion and just have jen tell them the patient is requesting the referral

## 2014-11-14 NOTE — Telephone Encounter (Signed)
Ok to refer to vascular

## 2014-11-14 NOTE — Addendum Note (Signed)
Addended by: Ewing Schlein on: 11/14/2014 01:49 PM   Modules accepted: Orders

## 2014-11-15 ENCOUNTER — Ambulatory Visit: Payer: BLUE CROSS/BLUE SHIELD | Admitting: Hematology & Oncology

## 2014-11-15 ENCOUNTER — Other Ambulatory Visit: Payer: BLUE CROSS/BLUE SHIELD

## 2014-11-15 NOTE — Addendum Note (Signed)
Addended by: Ewing Schlein on: 11/15/2014 08:56 AM   Modules accepted: Orders

## 2014-11-15 NOTE — Telephone Encounter (Signed)
Ref placed.      KP 

## 2014-11-20 ENCOUNTER — Other Ambulatory Visit: Payer: Self-pay

## 2014-11-29 ENCOUNTER — Ambulatory Visit (HOSPITAL_BASED_OUTPATIENT_CLINIC_OR_DEPARTMENT_OTHER): Payer: BLUE CROSS/BLUE SHIELD | Admitting: Family

## 2014-11-29 ENCOUNTER — Encounter: Payer: Self-pay | Admitting: Family

## 2014-11-29 ENCOUNTER — Ambulatory Visit (HOSPITAL_BASED_OUTPATIENT_CLINIC_OR_DEPARTMENT_OTHER): Payer: BLUE CROSS/BLUE SHIELD

## 2014-11-29 ENCOUNTER — Other Ambulatory Visit (HOSPITAL_BASED_OUTPATIENT_CLINIC_OR_DEPARTMENT_OTHER): Payer: BLUE CROSS/BLUE SHIELD

## 2014-11-29 VITALS — BP 128/75 | HR 70 | Temp 97.6°F | Resp 16 | Ht 67.0 in | Wt 230.0 lb

## 2014-11-29 DIAGNOSIS — Z23 Encounter for immunization: Secondary | ICD-10-CM | POA: Diagnosis not present

## 2014-11-29 DIAGNOSIS — D471 Chronic myeloproliferative disease: Secondary | ICD-10-CM | POA: Diagnosis not present

## 2014-11-29 DIAGNOSIS — D72828 Other elevated white blood cell count: Secondary | ICD-10-CM

## 2014-11-29 LAB — COMPREHENSIVE METABOLIC PANEL
ALK PHOS: 130 U/L — AB (ref 33–115)
ALT: 9 U/L (ref 6–29)
AST: 18 U/L (ref 10–35)
Albumin: 4 g/dL (ref 3.6–5.1)
BILIRUBIN TOTAL: 0.5 mg/dL (ref 0.2–1.2)
BUN: 10 mg/dL (ref 7–25)
CALCIUM: 10.1 mg/dL (ref 8.6–10.2)
CO2: 19 mmol/L — ABNORMAL LOW (ref 20–31)
Chloride: 104 mmol/L (ref 98–110)
Creatinine, Ser: 0.55 mg/dL (ref 0.50–1.10)
GLUCOSE: 74 mg/dL (ref 65–99)
POTASSIUM: 4.2 mmol/L (ref 3.5–5.3)
Sodium: 136 mmol/L (ref 135–146)
TOTAL PROTEIN: 7 g/dL (ref 6.1–8.1)

## 2014-11-29 LAB — CBC WITH DIFFERENTIAL (CANCER CENTER ONLY)
BASO#: 0 10*3/uL (ref 0.0–0.2)
BASO%: 0.1 % (ref 0.0–2.0)
EOS%: 0.2 % (ref 0.0–7.0)
Eosinophils Absolute: 0 10*3/uL (ref 0.0–0.5)
HEMATOCRIT: 37.8 % (ref 34.8–46.6)
HGB: 12.4 g/dL (ref 11.6–15.9)
LYMPH#: 2.5 10*3/uL (ref 0.9–3.3)
LYMPH%: 15.8 % (ref 14.0–48.0)
MCH: 31.4 pg (ref 26.0–34.0)
MCHC: 32.8 g/dL (ref 32.0–36.0)
MCV: 96 fL (ref 81–101)
MONO#: 0.9 10*3/uL (ref 0.1–0.9)
MONO%: 5.9 % (ref 0.0–13.0)
NEUT#: 12.5 10*3/uL — ABNORMAL HIGH (ref 1.5–6.5)
NEUT%: 78 % (ref 39.6–80.0)
PLATELETS: 249 10*3/uL (ref 145–400)
RBC: 3.95 10*6/uL (ref 3.70–5.32)
RDW: 13.5 % (ref 11.1–15.7)
WBC: 16.1 10*3/uL — AB (ref 3.9–10.0)

## 2014-11-29 LAB — TECHNOLOGIST REVIEW CHCC SATELLITE

## 2014-11-29 MED ORDER — INFLUENZA VAC SPLIT QUAD 0.5 ML IM SUSY
0.5000 mL | PREFILLED_SYRINGE | Freq: Once | INTRAMUSCULAR | Status: AC
  Administered 2014-11-29: 0.5 mL via INTRAMUSCULAR
  Filled 2014-11-29: qty 0.5

## 2014-11-29 MED ORDER — INFLUENZA VAC SPLIT QUAD 0.5 ML IM SUSY: 0.5000 mL | PREFILLED_SYRINGE | INTRAMUSCULAR | Status: DC

## 2014-11-29 NOTE — Patient Instructions (Signed)

## 2014-11-29 NOTE — Progress Notes (Signed)
Hematology and Oncology Follow Up Visit  CARLEY GLENDENNING [id number] 1970/07/27 44 y.o. 11/29/2014   Principle Diagnosis:  Chronic myeloproliferative syndrome-Triple negative  Current Therapy:   Peg-Interferon every 2 week dosing    Interim History:  Ms. Padovano is here today for a follow-up. She is doing well and has tolerated the pegasys nicely. Her friend gives her the injections every 2 weeks.  She still has some occasional fatigue but is doing ok right now.  She states that her right arm is better but the artery used for her cath is permanently damaged. Unfortunately there is nothing they can do to fix it.  She denies fever, chills, n/v, rash, dizziness, SOB, chest pain, palpitations, abdominal pain, changes in bowel or bladder habits. She has had no blood in her urine or stool.  She has a dry cough and is going through some medication changes with her PCP to try and see if that could be the cause.  She is going on a cruise with her boyfriend in a few weeks and is looking forward to getting away to relax.  She is eating healthy and staying hydrated. Her weight is stable.  She has had no swelling, tenderness, numbness or tingling in her extremities. No new aches or pains.    Medications:    Medication List       This list is accurate as of: 11/29/14  3:19 PM.  Always use your most recent med list.               aspirin 81 MG tablet  Take 81 mg by mouth daily.     doxepin 50 MG capsule  Commonly known as:  SINEQUAN  TAKE 1 CAPSULE BY MOUTH AT BEDTIME     Fish Oil 1000 MG Caps  Take 1 capsule by mouth every morning.     Flax Seed Oil 1000 MG Caps  Take 1 capsule by mouth 3 (three) times daily.     ibuprofen 800 MG tablet  Commonly known as:  ADVIL,MOTRIN  Take 1 tablet (800 mg total) by mouth every 8 (eight) hours as needed.     losartan-hydrochlorothiazide 50-12.5 MG per tablet  Commonly known as:  HYZAAR  Take 1 tablet by mouth daily.     Magnesium 500 MG Tabs    Take 1 tablet by mouth daily.     Melatonin 5 MG Tabs  Take 1 tablet by mouth as needed (sleep).     mirabegron ER 50 MG Tb24 tablet  Commonly known as:  MYRBETRIQ  Take 1 tablet (50 mg total) by mouth daily.     omeprazole 20 MG capsule  Commonly known as:  PRILOSEC  Take 1 capsule (20 mg total) by mouth daily.     oxyCODONE-acetaminophen 7.5-325 MG per tablet  Commonly known as:  PERCOCET  Take 1 tablet by mouth every 4 (four) hours as needed for severe pain.     PEGASYS 180 MCG/ML injection  Generic drug:  peginterferon alfa-2a  Inject 0.45 mLs (81 mcg total) into the skin every 7 (seven) days.        Allergies: No Known Allergies  Past Medical History, Surgical history, Social history, and Family History were reviewed and updated.  Review of Systems: All other 10 point review of systems is negative.   Physical Exam:  height is 5\' 7"  (1.702 m) and weight is 230 lb (104.327 kg). Her oral temperature is 97.6 F (36.4 C). Her blood pressure is 128/75 and her  pulse is 70. Her respiration is 16.   Wt Readings from Last 3 Encounters:  11/29/14 230 lb (104.327 kg)  11/11/14 231 lb (104.781 kg)  11/04/14 230 lb 9.6 oz (104.599 kg)    Ocular: Sclerae unicteric, pupils equal, round and reactive to light Ear-nose-throat: Oropharynx clear, dentition fair Lymphatic: No cervical or supraclavicular adenopathy Lungs no rales or rhonchi, good excursion bilaterally Heart regular rate and rhythm, no murmur appreciated Abd soft, nontender, positive bowel sounds MSK no focal spinal tenderness, no joint edema Neuro: non-focal, well-oriented, appropriate affect Breasts: Deferred  Lab Results  Component Value Date   WBC 18.8* 11/13/2014   HGB 12.4 11/13/2014   HCT 36.7 11/13/2014   MCV 96 11/13/2014   PLT 213 11/13/2014   Lab Results  Component Value Date   FERRITIN 118 11/20/2013   IRON 80 11/20/2013   TIBC 285 11/20/2013   UIBC 206 11/20/2013   IRONPCTSAT 28 11/20/2013    Lab Results  Component Value Date   RETICCTPCT 5.3* 11/20/2013   RBC 3.82 11/13/2014   RETICCTABS 116.1 11/20/2013   No results found for: KPAFRELGTCHN, LAMBDASER, KAPLAMBRATIO No results found for: IGGSERUM, IGA, IGMSERUM No results found for: Odetta Pink, SPEI   Chemistry      Component Value Date/Time   NA 135 11/13/2014 1507   NA 145 10/04/2014 1357   K 3.7 11/13/2014 1507   K 4.1 10/04/2014 1357   CL 101 11/13/2014 1507   CL 107 10/04/2014 1357   CO2 26 11/13/2014 1507   CO2 26 10/04/2014 1357   BUN 13 11/13/2014 1507   BUN 11 10/04/2014 1357   CREATININE 0.55 11/13/2014 1507   CREATININE 0.5* 10/04/2014 1357      Component Value Date/Time   CALCIUM 10.1 11/13/2014 1507   CALCIUM 10.3 10/04/2014 1357   ALKPHOS 121* 11/13/2014 1507   ALKPHOS 146* 10/04/2014 1357   AST 12 11/13/2014 1507   AST 25 10/04/2014 1357   ALT 9 11/13/2014 1507   ALT 19 10/04/2014 1357   BILITOT 0.4 11/13/2014 1507   BILITOT 0.90 10/04/2014 1357     Impression and Plan: Ms. Peed is a 44 yo white female with a myeloproliferative syndrome. So far, she has done well on the pegasys and has no complaints at this time.  She will continue her injections every 2 weeks. Her WBC count today is 16.1.  Since she will be on vacation in two weeks we will do labs every 3 weeks. And plan to see her back for a follow-up in 6 weeks.  She knows to contact us with any questions or concerns. We can certainly see her sooner if need be.   Eliezer Bottom, NP 9/9/20163:19 PM

## 2014-12-10 ENCOUNTER — Telehealth: Payer: Self-pay | Admitting: Hematology & Oncology

## 2014-12-10 ENCOUNTER — Encounter: Payer: Self-pay | Admitting: *Deleted

## 2014-12-10 NOTE — Telephone Encounter (Signed)
Patient's appt calendar was mailed to patient's home

## 2014-12-18 ENCOUNTER — Encounter: Payer: Self-pay | Admitting: Vascular Surgery

## 2014-12-19 ENCOUNTER — Other Ambulatory Visit: Payer: BLUE CROSS/BLUE SHIELD

## 2014-12-20 ENCOUNTER — Ambulatory Visit (INDEPENDENT_AMBULATORY_CARE_PROVIDER_SITE_OTHER): Payer: BLUE CROSS/BLUE SHIELD | Admitting: Vascular Surgery

## 2014-12-20 ENCOUNTER — Other Ambulatory Visit (HOSPITAL_BASED_OUTPATIENT_CLINIC_OR_DEPARTMENT_OTHER): Payer: BLUE CROSS/BLUE SHIELD

## 2014-12-20 ENCOUNTER — Encounter: Payer: Self-pay | Admitting: Vascular Surgery

## 2014-12-20 VITALS — BP 114/67 | HR 61 | Ht 67.0 in | Wt 232.4 lb

## 2014-12-20 DIAGNOSIS — I742 Embolism and thrombosis of arteries of the upper extremities: Secondary | ICD-10-CM

## 2014-12-20 DIAGNOSIS — D471 Chronic myeloproliferative disease: Secondary | ICD-10-CM | POA: Diagnosis not present

## 2014-12-20 DIAGNOSIS — I70208 Unspecified atherosclerosis of native arteries of extremities, other extremity: Secondary | ICD-10-CM | POA: Insufficient documentation

## 2014-12-20 LAB — CBC WITH DIFFERENTIAL (CANCER CENTER ONLY)
BASO#: 0 10*3/uL (ref 0.0–0.2)
BASO%: 0 % (ref 0.0–2.0)
EOS ABS: 0.1 10*3/uL (ref 0.0–0.5)
EOS%: 0.3 % (ref 0.0–7.0)
HCT: 40.5 % (ref 34.8–46.6)
HGB: 13.5 g/dL (ref 11.6–15.9)
LYMPH#: 2.6 10*3/uL (ref 0.9–3.3)
LYMPH%: 13 % — AB (ref 14.0–48.0)
MCH: 31.9 pg (ref 26.0–34.0)
MCHC: 33.3 g/dL (ref 32.0–36.0)
MCV: 96 fL (ref 81–101)
MONO#: 0.9 10*3/uL (ref 0.1–0.9)
MONO%: 4.6 % (ref 0.0–13.0)
NEUT#: 16.7 10*3/uL — ABNORMAL HIGH (ref 1.5–6.5)
NEUT%: 82.1 % — AB (ref 39.6–80.0)
PLATELETS: 251 10*3/uL (ref 145–400)
RBC: 4.23 10*6/uL (ref 3.70–5.32)
RDW: 12.9 % (ref 11.1–15.7)
WBC: 20.4 10*3/uL — ABNORMAL HIGH (ref 3.9–10.0)

## 2014-12-20 LAB — COMPREHENSIVE METABOLIC PANEL (CC13)
ALT: 15 U/L (ref 0–55)
ANION GAP: 9 meq/L (ref 3–11)
AST: 21 U/L (ref 5–34)
Albumin: 4.2 g/dL (ref 3.5–5.0)
Alkaline Phosphatase: 152 U/L — ABNORMAL HIGH (ref 40–150)
BILIRUBIN TOTAL: 0.66 mg/dL (ref 0.20–1.20)
BUN: 9.6 mg/dL (ref 7.0–26.0)
CO2: 26 meq/L (ref 22–29)
Calcium: 10.3 mg/dL (ref 8.4–10.4)
Chloride: 103 mEq/L (ref 98–109)
Creatinine: 0.7 mg/dL (ref 0.6–1.1)
Glucose: 83 mg/dl (ref 70–140)
Potassium: 3.9 mEq/L (ref 3.5–5.1)
Sodium: 138 mEq/L (ref 136–145)
TOTAL PROTEIN: 7.7 g/dL (ref 6.4–8.3)

## 2014-12-20 LAB — TECHNOLOGIST REVIEW CHCC SATELLITE

## 2014-12-20 NOTE — Progress Notes (Signed)
Referred by:  Rosalita Chessman, DO [address] [place], [place] [address]   Reason for referral: right radial artery occlusion   History of Present Illness  Brianna Fletcher is a 44 y.o. (1970/12/01) female who presents with chief complaint: weakness in right hand s/p R Radial cath.  Pt has R radial cath by Dr. Gwenlyn Found on 10/03/14 for dx cardiac cath.  This patient notes development of weakness and pain in R hand after removal of the sheath.  She reportedly what worsening of her sx to the point that she had difficulty with ADL.  She had a RUE arterial doppler done on 10/08/14 which demonstrated occlusion of the radial artery.  She notes over a 6 week period her sx in her right hand improved to the point that she has resumed work.  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  She denies any persistent neurologic sx in her hands.     Past Medical History  Diagnosis Date  . Glaucoma   . Arthritis   . Leukocytosis, unspecified 11/15/2012  . Chronic neutrophilia 11/15/2012  . Pruritus 11/15/2012  . Monocytosis 11/15/2012  . Myeloproliferative disorder 03/08/2013  . Precordial chest pain   . Family history of heart disease     mother had stents placed in her 33s and had myocardial infarctions  . Radial artery occlusion, right     status post  right radial artery cardiac catheterization  . Blood dyscrasia     produces too many WBCs  . Vaginal delivery 1986, 2000    Past Surgical History  Procedure Laterality Date  . Tubal ligation  2000  . Eye surgery      laser eye surgery  . Cardiac catheterization N/A 10/03/2014    Procedure: Left Heart Cath and Coronary Angiography;  Surgeon: Lorretta Harp, MD;  Location: [facility];  Service: Cardiovascular;  Laterality: N/A;  . Dilitation & currettage/hystroscopy with novasure ablation N/A 11/08/2014    Procedure: DILATATION & CURETTAGE/HYSTEROSCOPY WITH NOVASURE ABLATION;  Surgeon: Linda Hedges, DO;  Location: [facility];  Service: Gynecology;  Laterality: N/A;    Social History   Social History  . Marital Status: Divorced    Spouse Name: N/A  . Number of Children: N/A  . Years of Education: N/A   Occupational History  . Not on file.   Social History Main Topics  . Smoking status: Former Smoker    Types: Cigarettes    Start date: 08/21/2014  . Smokeless tobacco: Never Used     Comment: patient reports light tobacco use, has not smoked since friday (09/27/14)  . Alcohol Use: 3.6 oz/week    6 Cans of beer per week  . Drug Use: No  . Sexual Activity:    Partners: Male   Other Topics Concern  . Not on file   Social History Narrative    Family History  Problem Relation Age of Onset  . Heart disease Mother   . Hypertension Mother   . Hyperlipidemia Mother   . Diabetes Father   . Cancer Father     esopageal ?  . Dementia Maternal Grandmother   . Heart disease Maternal Grandmother   . Heart disease Maternal Grandfather 11    MI  . Other Neg Hx     hyperparathyroidism  . Heart disease Cousin     Current Outpatient Prescriptions  Medication Sig Dispense Refill  .  aspirin 81 MG tablet Take 81 mg by mouth daily.    Marland Kitchen doxepin (SINEQUAN) 50 MG capsule TAKE 1 CAPSULE BY MOUTH AT BEDTIME 30 capsule 3  . Flaxseed, Linseed, (FLAX SEED OIL) 1000 MG CAPS Take 1 capsule by mouth 3 (three) times daily.    Marland Kitchen losartan-hydrochlorothiazide (HYZAAR) 50-12.5 MG per tablet Take 1 tablet by mouth daily. 30 tablet 2  . Magnesium 500 MG TABS Take 1 tablet by mouth daily.    . Melatonin 5 MG TABS Take 1 tablet by mouth as needed (sleep).     . mirabegron ER (MYRBETRIQ) 50 MG TB24 tablet Take 1 tablet (50 mg total) by mouth daily. 30 tablet 5  . Omega-3 Fatty Acids (FISH OIL) 1000 MG CAPS Take 1 capsule by mouth every morning.     Marland Kitchen omeprazole (PRILOSEC) 20 MG capsule Take 1 capsule (20 mg total) by mouth daily. 30 capsule 3  . PEGASYS 180 MCG/ML injection Inject 0.45 mLs (81 mcg  total) into the skin every 7 (seven) days. 4 mL 3  . ibuprofen (ADVIL,MOTRIN) 800 MG tablet Take 1 tablet (800 mg total) by mouth every 8 (eight) hours as needed. (Patient not taking: Reported on 12/20/2014) 30 tablet 0  . oxyCODONE-acetaminophen (PERCOCET) 7.5-325 MG per tablet Take 1 tablet by mouth every 4 (four) hours as needed for severe pain. (Patient not taking: Reported on 12/20/2014) 15 tablet 0   No current facility-administered medications for this visit.   Facility-Administered Medications Ordered in Other Visits  Medication Dose Route Frequency Provider Last Rate Last Dose  . acetaminophen (TYLENOL) tablet 650 mg  650 mg Oral Once [name], MD   650 mg at 04/26/14 1516  . peginterferon alfa-2a (PEGASYS) injection 81 mcg  81 mcg Subcutaneous Weekly [name], MD   81 mcg at 04/26/14 1530     No Known Allergies   REVIEW OF SYSTEMS:  (Positives checked otherwise negative)  CARDIOVASCULAR:   [ ]  chest pain,  [ ]  chest pressure,  [ ]  palpitations,  [ ]  shortness of breath when laying flat,  [ ]  shortness of breath with exertion,   [ ]  pain in feet when walking,  [ ]  pain in feet when laying flat, [ ]  history of blood clot in veins (DVT),  [ ]  history of phlebitis,  [ ]  swelling in legs,  [ ]  varicose veins  PULMONARY:   [ ]  productive cough,  [ ]  asthma,  [ ]  wheezing  NEUROLOGIC:   [x]  weakness in arms or legs,  [x]  numbness in arms or legs,  [ ]  difficulty speaking or slurred speech,  [ ]  temporary loss of vision in one eye,  [ ]  dizziness  HEMATOLOGIC:   [ ]  bleeding problems,  [ ]  problems with blood clotting too easily  MUSCULOSKEL:   [ ]  joint pain, [ ]  joint swelling  GASTROINTEST:   [ ]  vomiting blood,  [ ]  blood in stool     GENITOURINARY:   [ ]  burning with urination,  [ ]  blood in urine  PSYCHIATRIC:   [ ]  history of major depression  INTEGUMENTARY:   [ ]  rashes,  [ ]  ulcers  CONSTITUTIONAL:   [ ]  fever,  [ ]   chills   Physical Examination  Filed Vitals:   12/20/14 1420  BP: 114/67  Pulse: 61  Height: 5\' 7"  (1.702 m)  Weight: 232 lb 6.4 oz (105.416 kg)  SpO2: 98%   Body mass index is  36.39 kg/(m^2).  General: A&O x 3, WD, Obese,   Head: [place]/AT  Ear/Nose/Throat: Hearing grossly intact, nares w/o erythema or drainage, oropharynx w/o Erythema/Exudate, Mallampati score: 3  Eyes: PERRLA, EOMI  Neck: Supple, no nuchal rigidity, no palpable LAD  Pulmonary: Sym exp, good air movt, CTAB, no rales, rhonchi, & wheezing  Cardiac: RRR, Nl S1, S2, no Murmurs, rubs or gallops  Vascular: Vessel Right Left  Radial Not Palpable Palpable  Ulnar Palpable Palpable  Brachial Palpable Palpable  Carotid Palpable, without bruit Palpable, without bruit  Aorta Not palpable N/A  Femoral Palpable Palpable  Popliteal Not palpable Not palpable  PT Palpable Palpable  DP Palpable Palpable   Gastrointestinal: soft, NTND, no G/R, no HSM, no masses, no CVAT B  Musculoskeletal: M/S 5/5 throughout including hand grip, Extremities without ischemic changes   Neurologic: CN 2-12 intact , Pain and light touch intact in extremities , Motor exam as listed above  Psychiatric: Judgment intact, Mood & affect appropriate for pt's clinical situation  Dermatologic: See M/S exam for extremity exam, no rashes otherwise noted  Lymph : No Cervical, Axillary, or Inguinal lymphadenopathy    Non-Invasive Vascular Imaging  Outside RUE arterial doppler (10/07/13)  Radial artery occlusion other arteries patent  Outside Studies/Documentation 3 pages of outside documents were reviewed including: outside non-invasive studies.   Medical Decision Making  AMELIYAH SARNO is a 44 y.o. female who presents with: right hand weakness s/p R radial occlusion after R radial cath   I'm not certain this patient truly has claudication equivalent sx in her R hand.  She clearly has intact flow to her right hand with cap refill <2  sec in her fingers, so ulnar flow is adequate to maintain perfusion to the right hand.  I would obtain: BUE physiologic study with digital waveforms.  If the digital waveforms are similar, this would suggest her sx are not related to the radial artery occlusion.  I would also refer her to Hand Surgery to evaluate for cubital tunnel or carpal tunnel etiology for her sx.  I have discussed with her that the definitive evaluation of the right hand perfusion would be a R arm runoff via a femoral cannulation.  Obviously she is reluctant to consider such at this time.  Will obtain the non-invasive studies of the R hand in 2-4 weeks and have her follow up after such.  Thank you for allowing [place] to participate in this patient's care.   Adele Barthel, MD [facility] Office: [phone] Pager: [phone]  12/20/2014, 2:59 PM

## 2014-12-20 NOTE — Addendum Note (Signed)
Addended by: Dorthula Rue L on: 12/20/2014 03:25 PM   Modules accepted: Orders

## 2014-12-23 ENCOUNTER — Ambulatory Visit (INDEPENDENT_AMBULATORY_CARE_PROVIDER_SITE_OTHER): Payer: BLUE CROSS/BLUE SHIELD | Admitting: Family Medicine

## 2014-12-23 ENCOUNTER — Encounter: Payer: Self-pay | Admitting: Family Medicine

## 2014-12-23 VITALS — BP 130/70 | HR 70 | Temp 98.2°F | Wt 237.0 lb

## 2014-12-23 DIAGNOSIS — R059 Cough, unspecified: Secondary | ICD-10-CM

## 2014-12-23 DIAGNOSIS — I10 Essential (primary) hypertension: Secondary | ICD-10-CM

## 2014-12-23 DIAGNOSIS — I70208 Unspecified atherosclerosis of native arteries of extremities, other extremity: Secondary | ICD-10-CM

## 2014-12-23 DIAGNOSIS — R05 Cough: Secondary | ICD-10-CM | POA: Diagnosis not present

## 2014-12-23 DIAGNOSIS — I742 Embolism and thrombosis of arteries of the upper extremities: Secondary | ICD-10-CM

## 2014-12-23 MED ORDER — AMLODIPINE BESYLATE 5 MG PO TABS: 5.0000 mg | ORAL_TABLET | Freq: Every day | ORAL | Status: DC

## 2014-12-23 NOTE — Patient Instructions (Signed)

## 2014-12-23 NOTE — Progress Notes (Signed)
Pre visit review using our clinic review tool, if applicable. No additional management support is needed unless otherwise documented below in the visit note. 

## 2014-12-23 NOTE — Assessment & Plan Note (Signed)
Discussed diet and exercise 

## 2014-12-23 NOTE — Progress Notes (Signed)
Patient ID: Brianna Fletcher, female   DOB: 1971-01-08, 44 y.o.   MRN: [id number]   Subjective:    Patient ID: Brianna Fletcher, female    DOB: [birth year], 44 y.o.   MRN: [id number]  Chief Complaint  Patient presents with  . Cough    Still having a dry cough for several months  . Hypertension    Wants to discuss medication    HPI Patient is in today for f/u bp.  She is still having a cough that is dry.  Pt is also struggling with weight loss.    Past Medical History  Diagnosis Date  . Glaucoma   . Arthritis   . Leukocytosis, unspecified 11/15/2012  . Chronic neutrophilia 11/15/2012  . Pruritus 11/15/2012  . Monocytosis 11/15/2012  . Myeloproliferative disorder ([place]) 03/08/2013  . Precordial chest pain   . Family history of heart disease     mother had stents placed in her 77s and had myocardial infarctions  . Radial artery occlusion, right ([facility])     status post  right radial artery cardiac catheterization  . Blood dyscrasia     produces too many WBCs  . Vaginal delivery 1986, 2000    Past Surgical History  Procedure Laterality Date  . Tubal ligation  2000  . Eye surgery      laser eye surgery  . Cardiac catheterization N/A 10/03/2014    Procedure: Left Heart Cath and Coronary Angiography;  Surgeon: Lorretta Harp, MD;  Location: [facility];  Service: Cardiovascular;  Laterality: N/A;  . Dilitation & currettage/hystroscopy with novasure ablation N/A 11/08/2014    Procedure: DILATATION & CURETTAGE/HYSTEROSCOPY WITH NOVASURE ABLATION;  Surgeon: Linda Hedges, DO;  Location: [facility];  Service: Gynecology;  Laterality: N/A;    Family History  Problem Relation Age of Onset  . Heart disease Mother   . Hypertension Mother   . Hyperlipidemia Mother   . Diabetes Father   . Cancer Father     esopageal ?  . Dementia Maternal Grandmother   . Heart disease Maternal Grandmother   . Heart disease Maternal Grandfather 89    MI  . Other Neg Hx     hyperparathyroidism  . Heart  disease Cousin     Social History   Social History  . Marital Status: Divorced    Spouse Name: N/A  . Number of Children: N/A  . Years of Education: N/A   Occupational History  . Not on file.   Social History Main Topics  . Smoking status: Former Smoker    Types: Cigarettes    Start date: 08/21/2014  . Smokeless tobacco: Never Used     Comment: patient reports light tobacco use, has not smoked since friday (09/27/14)  . Alcohol Use: 3.6 oz/week    6 Cans of beer per week  . Drug Use: No  . Sexual Activity:    Partners: Male   Other Topics Concern  . Not on file   Social History Narrative    Outpatient Prescriptions Prior to Visit  Medication Sig Dispense Refill  . aspirin 81 MG tablet Take 81 mg by mouth daily.    Marland Kitchen doxepin (SINEQUAN) 50 MG capsule TAKE 1 CAPSULE BY MOUTH AT BEDTIME 30 capsule 3  . Flaxseed, Linseed, (FLAX SEED OIL) 1000 MG CAPS Take 1 capsule by mouth 3 (three) times daily.    . Magnesium 500 MG TABS Take 1 tablet by mouth daily.    . Melatonin 5 MG TABS  Take 1 tablet by mouth as needed (sleep).     . mirabegron ER (MYRBETRIQ) 50 MG TB24 tablet Take 1 tablet (50 mg total) by mouth daily. 30 tablet 5  . Omega-3 Fatty Acids (FISH OIL) 1000 MG CAPS Take 1 capsule by mouth every morning.     Marland Kitchen omeprazole (PRILOSEC) 20 MG capsule Take 1 capsule (20 mg total) by mouth daily. 30 capsule 3  . oxyCODONE-acetaminophen (PERCOCET) 7.5-325 MG per tablet Take 1 tablet by mouth every 4 (four) hours as needed for severe pain. 15 tablet 0  . PEGASYS 180 MCG/ML injection Inject 0.45 mLs (81 mcg total) into the skin every 7 (seven) days. 4 mL 3  . ibuprofen (ADVIL,MOTRIN) 800 MG tablet Take 1 tablet (800 mg total) by mouth every 8 (eight) hours as needed. 30 tablet 0  . losartan-hydrochlorothiazide (HYZAAR) 50-12.5 MG per tablet Take 1 tablet by mouth daily. 30 tablet 2   Facility-Administered Medications Prior to Visit  Medication Dose Route Frequency Provider Last Rate  Last Dose  . acetaminophen (TYLENOL) tablet 650 mg  650 mg Oral Once [name], MD   650 mg at 04/26/14 1516  . peginterferon alfa-2a (PEGASYS) injection 81 mcg  81 mcg Subcutaneous Weekly [name], MD   81 mcg at 04/26/14 1530    Allergies  Allergen Reactions  . Lisinopril Cough  . Losartan Cough    Review of Systems  Constitutional: Negative for fever and malaise/fatigue.  HENT: Negative for congestion.   Eyes: Negative for discharge.  Respiratory: Negative for shortness of breath.   Cardiovascular: Negative for chest pain, palpitations and leg swelling.  Gastrointestinal: Negative for nausea and abdominal pain.  Genitourinary: Negative for dysuria.  Musculoskeletal: Negative for falls.  Skin: Negative for rash.  Neurological: Negative for loss of consciousness and headaches.  Endo/Heme/Allergies: Negative for environmental allergies.  Psychiatric/Behavioral: Negative for depression. The patient is not nervous/anxious.        Objective:    Physical Exam  Constitutional: She is oriented to person, place, and time. She appears well-developed and well-nourished.  HENT:  Head: Normocephalic and atraumatic.  Eyes: Conjunctivae and EOM are normal.  Neck: Normal range of motion. Neck supple. No JVD present. Carotid bruit is not present. No thyromegaly present.  Cardiovascular: Normal rate, regular rhythm and normal heart sounds.   No murmur heard. Pulmonary/Chest: Effort normal and breath sounds normal. No respiratory distress. She has no wheezes. She has no rales. She exhibits no tenderness.  Musculoskeletal: She exhibits no edema.  Neurological: She is alert and oriented to person, place, and time.  Psychiatric: She has a normal mood and affect. Her behavior is normal.    BP 130/70 mmHg  Pulse 70  Temp(Src) 98.2 F (36.8 C) (Oral)  Wt 237 lb (107.502 kg)  SpO2 98% Wt Readings from Last 3 Encounters:  12/23/14 237 lb (107.502 kg)  12/20/14 232 lb 6.4 oz  (105.416 kg)  11/29/14 230 lb (104.327 kg)     Lab Results  Component Value Date   WBC 20.4* 12/20/2014   HGB 13.5 12/20/2014   HCT 40.5 12/20/2014   PLT 251 12/20/2014   GLUCOSE 83 12/20/2014   CHOL 146 10/15/2014   TRIG 102.0 10/15/2014   HDL 40.70 10/15/2014   LDLCALC 85 10/15/2014   ALT 15 12/20/2014   AST 21 12/20/2014   NA 138 12/20/2014   K 3.9 12/20/2014   CL 104 11/29/2014   CREATININE 0.7 12/20/2014   BUN 9.6 12/20/2014  CO2 26 12/20/2014   TSH 2.91 10/15/2014   INR 0.91 10/01/2014    Lab Results  Component Value Date   TSH 2.91 10/15/2014   Lab Results  Component Value Date   WBC 20.4* 12/20/2014   HGB 13.5 12/20/2014   HCT 40.5 12/20/2014   MCV 96 12/20/2014   PLT 251 12/20/2014   Lab Results  Component Value Date   NA 138 12/20/2014   K 3.9 12/20/2014   CHLORIDE 103 12/20/2014   CO2 26 12/20/2014   GLUCOSE 83 12/20/2014   BUN 9.6 12/20/2014   CREATININE 0.7 12/20/2014   BILITOT 0.66 12/20/2014   ALKPHOS 152* 12/20/2014   AST 21 12/20/2014   ALT 15 12/20/2014   PROT 7.7 12/20/2014   ALBUMIN 4.2 12/20/2014   CALCIUM 10.3 12/20/2014   ANIONGAP 9 12/20/2014   EGFR >90 12/20/2014   GFR 100.47 10/15/2014   Lab Results  Component Value Date   CHOL 146 10/15/2014   Lab Results  Component Value Date   HDL 40.70 10/15/2014   Lab Results  Component Value Date   LDLCALC 85 10/15/2014   Lab Results  Component Value Date   TRIG 102.0 10/15/2014   Lab Results  Component Value Date   CHOLHDL 4 10/15/2014   No results found for: HGBA1C     Assessment & Plan:   Problem List Items Addressed This Visit    Radial artery occlusion, right ([place])    Per vascular and pt was referred to hand surgeon      Relevant Medications   amLODipine (NORVASC) 5 MG tablet   HTN (hypertension)   Relevant Medications   amLODipine (NORVASC) 5 MG tablet   Cough - Primary    Dry---  Maybe from [facility] D/c and start norvasc         I have  discontinued Brianna Fletcher's losartan-hydrochlorothiazide and ibuprofen. I am also having her start on amLODipine. Additionally, I am having her maintain her doxepin, Fish Oil, aspirin, Melatonin, omeprazole, PEGASYS, mirabegron ER, Magnesium, Flax Seed Oil, and oxyCODONE-acetaminophen.  Meds ordered this encounter  Medications  . amLODipine (NORVASC) 5 MG tablet    Sig: Take 1 tablet (5 mg total) by mouth daily.    Dispense:  90 tablet    Refill:  Bonita, DO

## 2014-12-23 NOTE — Assessment & Plan Note (Signed)
Per vascular and pt was referred to hand surgeon

## 2014-12-23 NOTE — Assessment & Plan Note (Signed)
Dry---  Maybe from [facility] D/c and start norvasc

## 2014-12-27 ENCOUNTER — Other Ambulatory Visit (HOSPITAL_COMMUNITY): Payer: BLUE CROSS/BLUE SHIELD

## 2014-12-30 ENCOUNTER — Ambulatory Visit (INDEPENDENT_AMBULATORY_CARE_PROVIDER_SITE_OTHER): Payer: BLUE CROSS/BLUE SHIELD | Admitting: Family Medicine

## 2014-12-30 ENCOUNTER — Encounter: Payer: Self-pay | Admitting: Family Medicine

## 2014-12-30 VITALS — BP 118/76 | HR 75 | Temp 98.6°F | Wt 237.0 lb

## 2014-12-30 DIAGNOSIS — J209 Acute bronchitis, unspecified: Secondary | ICD-10-CM | POA: Diagnosis not present

## 2014-12-30 MED ORDER — AZITHROMYCIN 250 MG PO TABS: ORAL_TABLET | ORAL | Status: DC

## 2014-12-30 MED ORDER — GUAIFENESIN-CODEINE 100-10 MG/5ML PO SYRP: 5.0000 mL | ORAL_SOLUTION | Freq: Three times a day (TID) | ORAL | Status: DC | PRN

## 2014-12-30 NOTE — Progress Notes (Signed)
Pre visit review using our clinic review tool, if applicable. No additional management support is needed unless otherwise documented below in the visit note. 

## 2014-12-30 NOTE — Patient Instructions (Signed)

## 2014-12-30 NOTE — Progress Notes (Signed)
Patient ID: Brianna Fletcher, female    DOB: 12-10-1970  Age: 44 y.o. MRN: [id number]    Subjective:  Subjective HPI MALENA TIMPONE presents for c/o cough --- wed she was sneezing, eyes watering, sore throat.  She took zyrtec and it seemed to work but Sunday she started to feel worse again.  She coughed all night -- cough was dry --not productive.   No fever.    Review of Systems  Constitutional: Positive for chills. Negative for fever.  HENT: Positive for congestion, postnasal drip, rhinorrhea, sinus pressure and sneezing.   Respiratory: Positive for cough, shortness of breath and wheezing. Negative for chest tightness.   Cardiovascular: Negative for chest pain, palpitations and leg swelling.  Allergic/Immunologic: Negative for environmental allergies.    History Past Medical History  Diagnosis Date  . Glaucoma   . Arthritis   . Leukocytosis, unspecified 11/15/2012  . Chronic neutrophilia 11/15/2012  . Pruritus 11/15/2012  . Monocytosis 11/15/2012  . Myeloproliferative disorder ([place]) 03/08/2013  . Precordial chest pain   . Family history of heart disease     mother had stents placed in her 73s and had myocardial infarctions  . Radial artery occlusion, right (HCC)     status post  right radial artery cardiac catheterization  . Blood dyscrasia     produces too many WBCs  . Vaginal delivery 1986, 2000    She has past surgical history that includes Tubal ligation (2000); Eye surgery; Cardiac catheterization (N/A, 10/03/2014); and Dilatation & currettage/hysteroscopy with novasure ablation (N/A, 11/08/2014).   Her family history includes Cancer in her father; Dementia in her maternal grandmother; Diabetes in her father; Heart disease in her cousin, maternal grandmother, and mother; Heart disease (age of onset: 4) in her maternal grandfather; Hyperlipidemia in her mother; Hypertension in her mother. There is no history of Other.She reports that she has quit smoking. Her smoking use included  Cigarettes. She started smoking about 4 months ago. She has never used smokeless tobacco. She reports that she drinks about 3.6 oz of alcohol per week. She reports that she does not use illicit drugs.  Current Outpatient Prescriptions on File Prior to Visit  Medication Sig Dispense Refill  . amLODipine (NORVASC) 5 MG tablet Take 1 tablet (5 mg total) by mouth daily. 90 tablet 3  . aspirin 81 MG tablet Take 81 mg by mouth daily.    Marland Kitchen doxepin (SINEQUAN) 50 MG capsule TAKE 1 CAPSULE BY MOUTH AT BEDTIME 30 capsule 3  . Flaxseed, Linseed, (FLAX SEED OIL) 1000 MG CAPS Take 1 capsule by mouth 3 (three) times daily.    . Magnesium 500 MG TABS Take 1 tablet by mouth daily.    . Melatonin 5 MG TABS Take 1 tablet by mouth as needed (sleep).     . mirabegron ER (MYRBETRIQ) 50 MG TB24 tablet Take 1 tablet (50 mg total) by mouth daily. 30 tablet 5  . Omega-3 Fatty Acids (FISH OIL) 1000 MG CAPS Take 1 capsule by mouth every morning.     Marland Kitchen omeprazole (PRILOSEC) 20 MG capsule Take 1 capsule (20 mg total) by mouth daily. 30 capsule 3  . oxyCODONE-acetaminophen (PERCOCET) 7.5-325 MG per tablet Take 1 tablet by mouth every 4 (four) hours as needed for severe pain. 15 tablet 0  . PEGASYS 180 MCG/ML injection Inject 0.45 mLs (81 mcg total) into the skin every 7 (seven) days. 4 mL 3   Current Facility-Administered Medications on File Prior to Visit  Medication Dose Route  Frequency Provider Last Rate Last Dose  . acetaminophen (TYLENOL) tablet 650 mg  650 mg Oral Once [name], MD   650 mg at 04/26/14 1516  . peginterferon alfa-2a (PEGASYS) injection 81 mcg  81 mcg Subcutaneous Weekly [name], MD   81 mcg at 04/26/14 1530     Objective:  Objective Physical Exam  Constitutional: She is oriented to person, place, and time. She appears well-developed and well-nourished.  HENT:  Right Ear: External ear normal.  Left Ear: External ear normal.  Mouth/Throat: Posterior oropharyngeal erythema present.    + PND + errythema  Eyes: Conjunctivae are normal. Right eye exhibits no discharge. Left eye exhibits no discharge.  Cardiovascular: Normal rate, regular rhythm and normal heart sounds.   No murmur heard. Pulmonary/Chest: Effort normal and breath sounds normal. No respiratory distress. She has no wheezes. She has no rales. She exhibits no tenderness.  Musculoskeletal: She exhibits no edema.  Lymphadenopathy:    She has cervical adenopathy.  Neurological: She is alert and oriented to person, place, and time.  Nursing note and vitals reviewed.  BP 118/76 mmHg  Pulse 75  Temp(Src) 98.6 F (37 C) (Oral)  Wt 237 lb (107.502 kg)  SpO2 98% Wt Readings from Last 3 Encounters:  12/30/14 237 lb (107.502 kg)  12/23/14 237 lb (107.502 kg)  12/20/14 232 lb 6.4 oz (105.416 kg)     Lab Results  Component Value Date   WBC 20.4* 12/20/2014   HGB 13.5 12/20/2014   HCT 40.5 12/20/2014   PLT 251 12/20/2014   GLUCOSE 83 12/20/2014   CHOL 146 10/15/2014   TRIG 102.0 10/15/2014   HDL 40.70 10/15/2014   LDLCALC 85 10/15/2014   ALT 15 12/20/2014   AST 21 12/20/2014   NA 138 12/20/2014   K 3.9 12/20/2014   CL 104 11/29/2014   CREATININE 0.7 12/20/2014   BUN 9.6 12/20/2014   CO2 26 12/20/2014   TSH 2.91 10/15/2014   INR 0.91 10/01/2014    No results found.   Assessment & Plan:  Plan I am having Ms. Aldape start on azithromycin and guaiFENesin-codeine. I am also having her maintain her doxepin, Fish Oil, aspirin, Melatonin, omeprazole, PEGASYS, mirabegron ER, Magnesium, Flax Seed Oil, oxyCODONE-acetaminophen, and amLODipine.  Meds ordered this encounter  Medications  . azithromycin (ZITHROMAX Z-PAK) 250 MG tablet    Sig: As directed    Dispense:  6 each    Refill:  0  . guaiFENesin-codeine (ROBITUSSIN AC) 100-10 MG/5ML syrup    Sig: Take 5 mLs by mouth 3 (three) times daily as needed for cough.    Dispense:  120 mL    Refill:  0    Problem List Items Addressed This Visit     None    Visit Diagnoses    Acute bronchitis, unspecified organism    -  Primary    Relevant Medications    azithromycin (ZITHROMAX Z-PAK) 250 MG tablet    guaiFENesin-codeine (ROBITUSSIN AC) 100-10 MG/5ML syrup       Follow-up: Return if symptoms worsen or fail to improve.  Garnet Koyanagi, DO

## 2014-12-31 ENCOUNTER — Encounter: Payer: Self-pay | Admitting: Hematology & Oncology

## 2014-12-31 ENCOUNTER — Ambulatory Visit: Payer: BLUE CROSS/BLUE SHIELD | Admitting: Hematology & Oncology

## 2014-12-31 ENCOUNTER — Ambulatory Visit (HOSPITAL_BASED_OUTPATIENT_CLINIC_OR_DEPARTMENT_OTHER): Payer: BLUE CROSS/BLUE SHIELD | Admitting: Hematology & Oncology

## 2014-12-31 ENCOUNTER — Other Ambulatory Visit: Payer: BLUE CROSS/BLUE SHIELD

## 2014-12-31 ENCOUNTER — Other Ambulatory Visit (HOSPITAL_BASED_OUTPATIENT_CLINIC_OR_DEPARTMENT_OTHER): Payer: BLUE CROSS/BLUE SHIELD

## 2014-12-31 VITALS — BP 125/91 | HR 84 | Temp 97.2°F | Resp 18 | Ht 67.0 in | Wt 232.0 lb

## 2014-12-31 DIAGNOSIS — D471 Chronic myeloproliferative disease: Secondary | ICD-10-CM

## 2014-12-31 LAB — CBC WITH DIFFERENTIAL (CANCER CENTER ONLY)
BASO#: 0 10*3/uL (ref 0.0–0.2)
BASO%: 0.1 % (ref 0.0–2.0)
EOS%: 0.6 % (ref 0.0–7.0)
Eosinophils Absolute: 0.1 10*3/uL (ref 0.0–0.5)
HEMATOCRIT: 39.7 % (ref 34.8–46.6)
HGB: 12.8 g/dL (ref 11.6–15.9)
LYMPH#: 1.9 10*3/uL (ref 0.9–3.3)
LYMPH%: 10.8 % — ABNORMAL LOW (ref 14.0–48.0)
MCH: 31.5 pg (ref 26.0–34.0)
MCHC: 32.2 g/dL (ref 32.0–36.0)
MCV: 98 fL (ref 81–101)
MONO#: 0.8 10*3/uL (ref 0.1–0.9)
MONO%: 4.6 % (ref 0.0–13.0)
NEUT%: 83.9 % — ABNORMAL HIGH (ref 39.6–80.0)
NEUTROS ABS: 14.4 10*3/uL — AB (ref 1.5–6.5)
Platelets: 247 10*3/uL (ref 145–400)
RBC: 4.06 10*6/uL (ref 3.70–5.32)
RDW: 13.3 % (ref 11.1–15.7)
WBC: 17.1 10*3/uL — AB (ref 3.9–10.0)

## 2014-12-31 LAB — COMPREHENSIVE METABOLIC PANEL (CC13)
ALT: 11 U/L (ref 0–55)
ANION GAP: 10 meq/L (ref 3–11)
AST: 13 U/L (ref 5–34)
Albumin: 3.8 g/dL (ref 3.5–5.0)
Alkaline Phosphatase: 138 U/L (ref 40–150)
BILIRUBIN TOTAL: 0.53 mg/dL (ref 0.20–1.20)
BUN: 16.7 mg/dL (ref 7.0–26.0)
CALCIUM: 10.1 mg/dL (ref 8.4–10.4)
CO2: 24 meq/L (ref 22–29)
CREATININE: 0.7 mg/dL (ref 0.6–1.1)
Chloride: 106 mEq/L (ref 98–109)
EGFR: >90 mL/min/{1.73_m2} (ref >90)
Glucose: 116 mg/dl (ref 70–140)
Potassium: 4.2 mEq/L (ref 3.5–5.1)
Sodium: 141 mEq/L (ref 136–145)
TOTAL PROTEIN: 7.4 g/dL (ref 6.4–8.3)

## 2014-12-31 NOTE — Progress Notes (Signed)
Hematology and Oncology Follow Up Visit  Brianna Fletcher [id number] [birth year] 44 y.o. 12/31/2014   Principle Diagnosis:  Chronic myeloproliferative syndrome-Triple negative  Current Therapy:   Peg-Interferon q weekly - to go to every 3 week dosing.     Interim History:  [name] is back for followup. She is tolerating the peg-interferon well.   She had a wonderful time on a cruise. She had her hair braided. She really enjoyed going on a cruise. She never been on one before. She had no problems with nausea vomiting. She had no fever. She ate well. She overall just had a great time.  There is a lot of stress at work. There is a possibility that she may lose her job. She is quite worried about this for job and losing her insurance.  Otherwise, she's done very well with the interferon. I think we can try to go to 3 week dosing. Her blood counts have been holding quite stable with the 2 week dosing.  [name] had no issues with rashes. [name] had no abdominal pain. [name] had no leg swelling. [name] had no cough. Overall, her performance status is ECOG 1.   Medications:  Current outpatient prescriptions:  .  amLODipine (NORVASC) 5 MG tablet, Take 1 tablet (5 mg total) by mouth daily., Disp: 90 tablet, Rfl: 3 .  aspirin 81 MG tablet, Take 81 mg by mouth daily., Disp: , Rfl:  .  azithromycin (ZITHROMAX Z-PAK) 250 MG tablet, As directed, Disp: 6 each, Rfl: 0 .  doxepin (SINEQUAN) 50 MG capsule, TAKE 1 CAPSULE BY MOUTH AT BEDTIME, Disp: 30 capsule, Rfl: 3 .  Flaxseed, Linseed, (FLAX SEED OIL) 1000 MG CAPS, Take 1 capsule by mouth 3 (three) times daily., Disp: , Rfl:  .  guaiFENesin-codeine (ROBITUSSIN AC) 100-10 MG/5ML syrup, Take 5 mLs by mouth 3 (three) times daily as needed for cough., Disp: 120 mL, Rfl: 0 .  Magnesium 500 MG TABS, Take 1 tablet by mouth daily., Disp: , Rfl:  .  Melatonin 5 MG TABS, Take 1 tablet by mouth as needed (sleep). , Disp: , Rfl:  .  mirabegron ER (MYRBETRIQ) 50  MG TB24 tablet, Take 1 tablet (50 mg total) by mouth daily., Disp: 30 tablet, Rfl: 5 .  Omega-3 Fatty Acids (FISH OIL) 1000 MG CAPS, Take 1 capsule by mouth every morning. , Disp: , Rfl:  .  omeprazole (PRILOSEC) 20 MG capsule, Take 1 capsule (20 mg total) by mouth daily., Disp: 30 capsule, Rfl: 3 .  oxyCODONE-acetaminophen (PERCOCET) 7.5-325 MG per tablet, Take 1 tablet by mouth every 4 (four) hours as needed for severe pain., Disp: 15 tablet, Rfl: 0 .  PEGASYS 180 MCG/ML injection, Inject 0.45 mLs (81 mcg total) into the skin every 7 (seven) days., Disp: 4 mL, Rfl: 3 No current facility-administered medications for this visit.  Facility-Administered Medications Ordered in Other Visits:  .  acetaminophen (TYLENOL) tablet 650 mg, 650 mg, Oral, Once, Brianna Napoleon, MD, 650 mg at 04/26/14 1516 .  peginterferon alfa-2a (PEGASYS) injection 81 mcg, 81 mcg, Subcutaneous, Weekly, Brianna Napoleon, MD, 81 mcg at 04/26/14 1530  Allergies:  Allergies  Allergen Reactions  . Lisinopril Cough  . Losartan Cough    Past Medical History, Surgical history, Social history, and Family History were reviewed and updated.  Review of Systems: As above  Physical Exam:  height is 5\' 7"  (1.702 m) and weight is 232 lb (105.235 kg). Her oral temperature is 97.2 F (36.2  C). Her blood pressure is 125/91 and her pulse is 84. Her respiration is 18.   Well-developed and well-nourished white female. Head and neck exam shows no ocular or oral lesions. She has no palpable cervical or supraclavicular lymph nodes. Lungs are clear. Cardiac exam regular rate and rhythm with no murmurs rubs or bruits. Abdomen is soft. She has good bowel sounds. There is no fluid wave. There is no palpable liver or spleen tip. Back exam shows no tenderness over the spine or ribs. There is some tenderness over the right hip area. This is chronic. She has some slight decreased range of motion of the right hip. Extremities shows no clubbing,  cyanosis or edema. Skin exam no rashes, ecchymosis or petechia. Her skin is a little dry. Neurological exam shows no focal neurological deficits.  Lab Results  Component Value Date   WBC 17.1* 12/31/2014   HGB 12.8 12/31/2014   HCT 39.7 12/31/2014   MCV 98 12/31/2014   PLT 247 12/31/2014     Chemistry      Component Value Date/Time   NA 138 12/20/2014 1308   NA 136 11/29/2014 1339   NA 145 10/04/2014 1357   K 3.9 12/20/2014 1308   K 4.2 11/29/2014 1339   K 4.1 10/04/2014 1357   CL 104 11/29/2014 1339   CL 107 10/04/2014 1357   CO2 26 12/20/2014 1308   CO2 19* 11/29/2014 1339   CO2 26 10/04/2014 1357   BUN 9.6 12/20/2014 1308   BUN 10 11/29/2014 1339   BUN 11 10/04/2014 1357   CREATININE 0.7 12/20/2014 1308   CREATININE 0.55 11/29/2014 1339   CREATININE 0.5* 10/04/2014 1357      Component Value Date/Time   CALCIUM 10.3 12/20/2014 1308   CALCIUM 10.1 11/29/2014 1339   CALCIUM 10.3 10/04/2014 1357   ALKPHOS 152* 12/20/2014 1308   ALKPHOS 130* 11/29/2014 1339   ALKPHOS 146* 10/04/2014 1357   AST 21 12/20/2014 1308   AST 18 11/29/2014 1339   AST 25 10/04/2014 1357   ALT 15 12/20/2014 1308   ALT 9 11/29/2014 1339   ALT 19 10/04/2014 1357   BILITOT 0.66 12/20/2014 1308   BILITOT 0.5 11/29/2014 1339   BILITOT 0.90 10/04/2014 1357         Impression and Plan: Brianna Fletcher is 44 year old female. She has a myeloproliferative syndrome. So far, our workup has been totally negative.  Her white cell count tends to fluctuate. However, symptomatically, she is doing quite well. She is not itching nearly as much.  I think we'll try to move her to move her interferon to every 3  weeks. I think this would be reasonable. Her white cell count has really remained stable.  I want to continue to check her blood counts every 3 weeks. She'll have this before she has her interferon.  I want to see her back in 6 weeks.  Brianna Napoleon, MD 10/11/20169:07 AM

## 2015-01-01 ENCOUNTER — Telehealth: Payer: Self-pay | Admitting: Vascular Surgery

## 2015-01-01 NOTE — Telephone Encounter (Signed)
12/30/14 and 01/01/15: left 2 messages for pt regarding the ultrasound scheduled for our office on 01/17/15. Pt has the same test scheduled with Dr. Gwenlyn Found on 01/15/15. Informed patient that one will need to be cancelled. Asked her to please call our office as soon as possible.

## 2015-01-08 ENCOUNTER — Ambulatory Visit (INDEPENDENT_AMBULATORY_CARE_PROVIDER_SITE_OTHER): Payer: BLUE CROSS/BLUE SHIELD | Admitting: Medical

## 2015-01-08 ENCOUNTER — Telehealth: Payer: Self-pay | Admitting: Internal Medicine

## 2015-01-08 ENCOUNTER — Encounter: Payer: Self-pay | Admitting: Medical

## 2015-01-08 VITALS — BP 120/80 | HR 77 | Temp 97.8°F | Resp 16 | Ht 67.0 in | Wt 234.0 lb

## 2015-01-08 DIAGNOSIS — R319 Hematuria, unspecified: Secondary | ICD-10-CM

## 2015-01-08 DIAGNOSIS — M549 Dorsalgia, unspecified: Secondary | ICD-10-CM | POA: Diagnosis not present

## 2015-01-08 DIAGNOSIS — R1030 Lower abdominal pain, unspecified: Secondary | ICD-10-CM

## 2015-01-08 LAB — POCT URINALYSIS DIPSTICK
Bilirubin, UA: NEGATIVE
GLUCOSE UA: NEGATIVE
KETONES UA: NEGATIVE
Leukocytes, UA: NEGATIVE
Nitrite, UA: NEGATIVE
PROTEIN UA: NEGATIVE
SPEC GRAV UA: 1.02
Urobilinogen, UA: 0.2
pH, UA: 6

## 2015-01-08 LAB — COMPREHENSIVE METABOLIC PANEL
ALK PHOS: 133 U/L — AB (ref 33–115)
ALT: 8 U/L (ref 6–29)
AST: 11 U/L (ref 10–35)
Albumin: 4.1 g/dL (ref 3.6–5.1)
BILIRUBIN TOTAL: 0.5 mg/dL (ref 0.2–1.2)
BUN: 15 mg/dL (ref 7–25)
CALCIUM: 9.8 mg/dL (ref 8.6–10.2)
CO2: 25 mmol/L (ref 20–31)
Chloride: 102 mmol/L (ref 98–110)
Creat: 0.69 mg/dL (ref 0.50–1.10)
GLUCOSE: 96 mg/dL (ref 65–99)
Potassium: 4.4 mmol/L (ref 3.5–5.3)
Sodium: 135 mmol/L (ref 135–146)
TOTAL PROTEIN: 7.2 g/dL (ref 6.1–8.1)

## 2015-01-08 LAB — CBC WITH DIFFERENTIAL/PLATELET
BASOS ABS: 0 10*3/uL (ref 0.0–0.1)
BASOS PCT: 0 % (ref 0–1)
EOS ABS: 0 10*3/uL (ref 0.0–0.7)
EOS PCT: 0 % (ref 0–5)
HCT: 37.2 % (ref 36.0–46.0)
Hemoglobin: 12.4 g/dL (ref 12.0–15.0)
Lymphocytes Relative: 12 % (ref 12–46)
Lymphs Abs: 2.5 10*3/uL (ref 0.7–4.0)
MCH: 31.1 pg (ref 26.0–34.0)
MCHC: 33.3 g/dL (ref 30.0–36.0)
MCV: 93.2 fL (ref 78.0–100.0)
MPV: 10.3 fL (ref 8.6–12.4)
Monocytes Absolute: 1.2 10*3/uL — ABNORMAL HIGH (ref 0.1–1.0)
Monocytes Relative: 6 % (ref 3–12)
NEUTROS PCT: 82 % — AB (ref 43–77)
Neutro Abs: 17 10*3/uL — ABNORMAL HIGH (ref 1.7–7.7)
Platelets: 287 10*3/uL (ref 150–400)
RBC: 3.99 MIL/uL (ref 3.87–5.11)
RDW: 12.7 % (ref 11.5–15.5)
WBC: 20.7 10*3/uL — ABNORMAL HIGH (ref 4.0–10.5)

## 2015-01-08 LAB — LIPASE: Lipase: 8 U/L (ref 7–60)

## 2015-01-08 LAB — AMYLASE: Amylase: 29 U/L (ref 0–105)

## 2015-01-08 MED ORDER — ONDANSETRON 8 MG PO TBDP: 8.0000 mg | ORAL_TABLET | Freq: Three times a day (TID) | ORAL | Status: DC | PRN

## 2015-01-08 MED ORDER — HYDROCODONE-ACETAMINOPHEN 5-325 MG PO TABS: 1.0000 | ORAL_TABLET | Freq: Four times a day (QID) | ORAL | Status: DC | PRN

## 2015-01-08 NOTE — Patient Instructions (Signed)
Rx of norco for the pain. zofran for nauseau. Hydrate well. Await stat labs. Update [place] tomorrow on how you are feeling.  If pain worsens  tonight then ED evaluation.  Follow up 2-5 days with pcp or myself.  Depending on how your are feeling when we call you tomorrow may go ahead and order ct to evaluate for kidney stones.

## 2015-01-08 NOTE — Progress Notes (Signed)
Subjective:    Patient ID: Brianna Fletcher, female    DOB: 01/22/1971, 44 y.o.   MRN: [id number]  HPI  P tin with some left side flank pain/cva area Pain when she first woke this am. No fall or trauma. Pain little improved early with tylenol earlier. Then pain came back. Pain level is moderate to severe causing nausea. She vomited on time today. No hx of kidney.  While in waiting room. Had some mild abdomen pain.  Decrease appetite.   Review of Systems  Constitutional: Negative for fever, chills and fatigue.  Respiratory: Negative for cough, chest tightness, shortness of breath and wheezing.   Cardiovascular: Negative for chest pain and palpitations.  Gastrointestinal: Negative for abdominal pain.  Genitourinary: Negative for dysuria, urgency, hematuria and flank pain.  Musculoskeletal: Positive for back pain.  Hematological: Negative for adenopathy. Does not bruise/bleed easily.  Psychiatric/Behavioral: Negative for behavioral problems and confusion.    Past Medical History  Diagnosis Date  . Glaucoma   . Arthritis   . Leukocytosis, unspecified 11/15/2012  . Chronic neutrophilia 11/15/2012  . Pruritus 11/15/2012  . Monocytosis 11/15/2012  . Myeloproliferative disorder ([place]) 03/08/2013  . Precordial chest pain   . Family history of heart disease     mother had stents placed in her 53s and had myocardial infarctions  . Radial artery occlusion, right (HCC)     status post  right radial artery cardiac catheterization  . Blood dyscrasia     produces too many WBCs  . Vaginal delivery 1986, 2000    Social History   Social History  . Marital Status: Divorced    Spouse Name: N/A  . Number of Children: N/A  . Years of Education: N/A   Occupational History  . Not on file.   Social History Main Topics  . Smoking status: Former Smoker    Types: Cigarettes    Start date: 08/21/2014  . Smokeless tobacco: Never Used     Comment: patient reports light tobacco use, has not smoked  since friday (09/27/14)  . Alcohol Use: 3.6 oz/week    6 Cans of beer per week  . Drug Use: No  . Sexual Activity:    Partners: Male   Other Topics Concern  . Not on file   Social History Narrative    Past Surgical History  Procedure Laterality Date  . Tubal ligation  2000  . Eye surgery      laser eye surgery  . Cardiac catheterization N/A 10/03/2014    Procedure: Left Heart Cath and Coronary Angiography;  Surgeon: Lorretta Harp, MD;  Location: [facility];  Service: Cardiovascular;  Laterality: N/A;  . Dilitation & currettage/hystroscopy with novasure ablation N/A 11/08/2014    Procedure: DILATATION & CURETTAGE/HYSTEROSCOPY WITH NOVASURE ABLATION;  Surgeon: Linda Hedges, DO;  Location: [facility];  Service: Gynecology;  Laterality: N/A;    Family History  Problem Relation Age of Onset  . Heart disease Mother   . Hypertension Mother   . Hyperlipidemia Mother   . Diabetes Father   . Cancer Father     esopageal ?  . Dementia Maternal Grandmother   . Heart disease Maternal Grandmother   . Heart disease Maternal Grandfather 78    MI  . Other Neg Hx     hyperparathyroidism  . Heart disease Cousin     Allergies  Allergen Reactions  . Lisinopril Cough  . Losartan Cough    Current Outpatient Prescriptions on File Prior to  Visit  Medication Sig Dispense Refill  . amLODipine (NORVASC) 5 MG tablet Take 1 tablet (5 mg total) by mouth daily. 90 tablet 3  . aspirin 81 MG tablet Take 81 mg by mouth daily.    Marland Kitchen azithromycin (ZITHROMAX Z-PAK) 250 MG tablet As directed 6 each 0  . doxepin (SINEQUAN) 50 MG capsule TAKE 1 CAPSULE BY MOUTH AT BEDTIME 30 capsule 3  . Flaxseed, Linseed, (FLAX SEED OIL) 1000 MG CAPS Take 1 capsule by mouth 3 (three) times daily.    Marland Kitchen guaiFENesin-codeine (ROBITUSSIN AC) 100-10 MG/5ML syrup Take 5 mLs by mouth 3 (three) times daily as needed for cough. 120 mL 0  . Magnesium 500 MG TABS Take 1 tablet by mouth daily.    . Melatonin 5 MG TABS Take 1  tablet by mouth as needed (sleep).     . mirabegron ER (MYRBETRIQ) 50 MG TB24 tablet Take 1 tablet (50 mg total) by mouth daily. 30 tablet 5  . Omega-3 Fatty Acids (FISH OIL) 1000 MG CAPS Take 1 capsule by mouth every morning.     Marland Kitchen omeprazole (PRILOSEC) 20 MG capsule Take 1 capsule (20 mg total) by mouth daily. 30 capsule 3  . oxyCODONE-acetaminophen (PERCOCET) 7.5-325 MG per tablet Take 1 tablet by mouth every 4 (four) hours as needed for severe pain. 15 tablet 0  . PEGASYS 180 MCG/ML injection Inject 0.45 mLs (81 mcg total) into the skin every 7 (seven) days. 4 mL 3   Current Facility-Administered Medications on File Prior to Visit  Medication Dose Route Frequency Provider Last Rate Last Dose  . acetaminophen (TYLENOL) tablet 650 mg  650 mg Oral Once [name], MD   650 mg at 04/26/14 1516  . peginterferon alfa-2a (PEGASYS) injection 81 mcg  81 mcg Subcutaneous Weekly [name], MD   81 mcg at 04/26/14 1530    BP 120/80 mmHg  Pulse 77  Temp(Src) 97.8 F (36.6 C) (Oral)  Resp 16  Ht 5\' 7"  (1.702 m)  Wt 234 lb (106.142 kg)  BMI 36.64 kg/m2  SpO2 98%       Objective:   Physical Exam  General Appearance- Not in acute distress.  HEENT Eyes- Scleraeral/Conjuntiva-bilat- Not Yellow. Mouth & Throat- Normal.  Chest and Lung Exam Auscultation: Breath sounds:-Normal. Adventitious sounds:- No Adventitious sounds.  Cardiovascular Auscultation:Rythm - Regular. Heart Sounds -Normal heart sounds.  Abdomen Inspection:-Inspection Normal.  Palpation/Perucssion: Palpation and Percussion of the abdomen reveal- Non Tender, No Rebound tenderness, No rigidity(Guarding) and No Palpable abdominal masses.  Liver:-Normal.  Spleen:- Normal.   Back- mild left cva area pain.Some radiating to lt  upper flank.      Assessment & Plan:  Rx of norco for the pain. zofran for nauseau. Hydrate well. Await stat labs. Update [place] tomorrow on how you are feeling.  If pain worsens  tonight  then ED evaluation.  Follow up 2-5 days with pcp or myself.  Depending on how your are feeling when we call you tomorrow may go ahead and order ct to evaluate for kidney stones.

## 2015-01-08 NOTE — Progress Notes (Signed)
Pre visit review using our clinic review tool, if applicable. No additional management support is needed unless otherwise documented below in the visit note. 

## 2015-01-08 NOTE — Addendum Note (Signed)
Addended by: Tasia Catchings on: 01/08/2015 06:16 PM   Modules accepted: Orders

## 2015-01-08 NOTE — Telephone Encounter (Signed)
Called per oncall nurse regarding critical lab  WBC 20.7  Chart reviewed, has hx of persistent neutrophilia, no significant change from recent values  Pt afeb, UA dip neg at OV earlier today  Unable to reach pt by phone  [phone]  OK to follow at this time, consider hematology referral

## 2015-01-09 ENCOUNTER — Other Ambulatory Visit (HOSPITAL_BASED_OUTPATIENT_CLINIC_OR_DEPARTMENT_OTHER): Payer: BLUE CROSS/BLUE SHIELD

## 2015-01-09 ENCOUNTER — Telehealth: Payer: Self-pay | Admitting: Medical

## 2015-01-09 ENCOUNTER — Other Ambulatory Visit: Payer: Self-pay | Admitting: Family Medicine

## 2015-01-09 ENCOUNTER — Ambulatory Visit (HOSPITAL_BASED_OUTPATIENT_CLINIC_OR_DEPARTMENT_OTHER)
Admission: RE | Admit: 2015-01-09 | Discharge: 2015-01-09 | Disposition: A | Discharge status: Home / Self Care | Payer: BLUE CROSS/BLUE SHIELD | Source: Ambulatory Visit | Attending: Medical | Admitting: Medical

## 2015-01-09 ENCOUNTER — Other Ambulatory Visit: Payer: BLUE CROSS/BLUE SHIELD

## 2015-01-09 DIAGNOSIS — R319 Hematuria, unspecified: Secondary | ICD-10-CM | POA: Diagnosis not present

## 2015-01-09 DIAGNOSIS — R109 Unspecified abdominal pain: Secondary | ICD-10-CM | POA: Diagnosis present

## 2015-01-09 DIAGNOSIS — M549 Dorsalgia, unspecified: Secondary | ICD-10-CM

## 2015-01-09 LAB — URINE CULTURE
Colony Count: NO GROWTH
Organism ID, Bacteria: NO GROWTH

## 2015-01-09 NOTE — Telephone Encounter (Signed)
Pt notified by Brianna Fletcher that ct scheduled for 4:30 today. Pt is going to reschedule for later today.

## 2015-01-09 NOTE — Telephone Encounter (Signed)
Discussed with pt that will go ahead and try to schedule ct of abdomen/pelvis. Contact pt afterwards.

## 2015-01-10 ENCOUNTER — Telehealth: Payer: Self-pay | Admitting: Family Medicine

## 2015-01-10 ENCOUNTER — Other Ambulatory Visit: Payer: BLUE CROSS/BLUE SHIELD

## 2015-01-10 NOTE — Telephone Encounter (Signed)
Pt in states she feel fine. No pain and no nausea. Work up thus far negative except the blood in urine initially(urine culture was neg). Ct did not show stone. Wbc elevated but chronic in her case. Amlyase, lipase, and cmp were good. Pt states no bm since Tuesday. But no abdomen pain. No bloating. She wants to stop pain medicine and zofran. Stopping pain med may help her pass bm. She could also use dulcolax or miralax as well. I explained to her importance of repeating ua next week and need to further work up if pain returning. In event pain return and severe over weekend then ED evaluation. Pt agreed with management. She will call back and schedule with Dr. Etter Sjogren for Monday or Tuesday. I tried to transfer her but accidentally hung up.

## 2015-01-10 NOTE — Telephone Encounter (Signed)
Pt calling for CT results and what to do. She also said she has questions about the meds she is on. She is requesting a call from [place] at [phone].

## 2015-01-13 ENCOUNTER — Telehealth: Payer: Self-pay | Admitting: Family Medicine

## 2015-01-13 NOTE — Telephone Encounter (Signed)
Caller name: Brianna Fletcher  Relationship to patient: Self  Can be reached: [phone]   Reason for call: She is requesting a call back to speak with you directly because she says that  you know her current situation. (Pt wouldn't discuss with me because she states it to much to go over) Pt want to know if she is okay to wait for appointment tomorrow or if she need to come in today, if possible.

## 2015-01-13 NOTE — Telephone Encounter (Signed)
Spoke with patient and she stated she was having abdominal pain today and felt really bad, so she stayed in the bed all day today. She stated she is currently feeling fine and has a follow up tomorrow.     KP

## 2015-01-14 ENCOUNTER — Encounter: Payer: Self-pay | Admitting: Family Medicine

## 2015-01-14 ENCOUNTER — Encounter (HOSPITAL_BASED_OUTPATIENT_CLINIC_OR_DEPARTMENT_OTHER): Payer: Self-pay

## 2015-01-14 ENCOUNTER — Ambulatory Visit: Payer: BLUE CROSS/BLUE SHIELD | Admitting: Family Medicine

## 2015-01-14 ENCOUNTER — Ambulatory Visit (INDEPENDENT_AMBULATORY_CARE_PROVIDER_SITE_OTHER): Payer: BLUE CROSS/BLUE SHIELD | Admitting: Family Medicine

## 2015-01-14 ENCOUNTER — Ambulatory Visit (HOSPITAL_BASED_OUTPATIENT_CLINIC_OR_DEPARTMENT_OTHER)
Admission: RE | Admit: 2015-01-14 | Discharge: 2015-01-14 | Disposition: A | Discharge status: Home / Self Care | Payer: BLUE CROSS/BLUE SHIELD | Source: Ambulatory Visit | Attending: Family Medicine | Admitting: Family Medicine

## 2015-01-14 VITALS — BP 130/88 | HR 70 | Temp 98.1°F | Wt 228.0 lb

## 2015-01-14 DIAGNOSIS — N281 Cyst of kidney, acquired: Secondary | ICD-10-CM | POA: Diagnosis not present

## 2015-01-14 DIAGNOSIS — K5730 Diverticulosis of large intestine without perforation or abscess without bleeding: Secondary | ICD-10-CM | POA: Diagnosis not present

## 2015-01-14 DIAGNOSIS — R109 Unspecified abdominal pain: Secondary | ICD-10-CM | POA: Insufficient documentation

## 2015-01-14 DIAGNOSIS — R112 Nausea with vomiting, unspecified: Secondary | ICD-10-CM | POA: Insufficient documentation

## 2015-01-14 DIAGNOSIS — R1030 Lower abdominal pain, unspecified: Secondary | ICD-10-CM | POA: Diagnosis not present

## 2015-01-14 DIAGNOSIS — K5901 Slow transit constipation: Secondary | ICD-10-CM | POA: Diagnosis not present

## 2015-01-14 DIAGNOSIS — R1012 Left upper quadrant pain: Secondary | ICD-10-CM | POA: Insufficient documentation

## 2015-01-14 DIAGNOSIS — R319 Hematuria, unspecified: Secondary | ICD-10-CM | POA: Diagnosis not present

## 2015-01-14 DIAGNOSIS — D1803 Hemangioma of intra-abdominal structures: Secondary | ICD-10-CM | POA: Insufficient documentation

## 2015-01-14 DIAGNOSIS — K5900 Constipation, unspecified: Secondary | ICD-10-CM | POA: Insufficient documentation

## 2015-01-14 HISTORY — DX: Essential (primary) hypertension: I10

## 2015-01-14 LAB — POCT URINALYSIS DIPSTICK
Bilirubin, UA: NEGATIVE
Glucose, UA: NEGATIVE
Ketones, UA: NEGATIVE
Leukocytes, UA: NEGATIVE
NITRITE UA: NEGATIVE
PH UA: 6
RBC UA: NEGATIVE
UROBILINOGEN UA: 0.2

## 2015-01-14 MED ORDER — IOHEXOL 300 MG/ML  SOLN
100.0000 mL | Freq: Once | INTRAMUSCULAR | Status: AC | PRN
  Administered 2015-01-14: 100 mL via INTRAVENOUS

## 2015-01-14 NOTE — Progress Notes (Signed)
Pre visit review using our clinic review tool, if applicable. No additional management support is needed unless otherwise documented below in the visit note. 

## 2015-01-14 NOTE — Patient Instructions (Signed)

## 2015-01-14 NOTE — Assessment & Plan Note (Signed)
persistant Ct abd/ pelvis If pain worsens---- go to ER

## 2015-01-14 NOTE — Progress Notes (Signed)
Patient ID: Brianna Fletcher, female    DOB: 29-Mar-1970  Age: 44 y.o. MRN: [id number]    Subjective:  Subjective HPI Brianna Fletcher presents for abd pain -- no better-- it has actually got worse.  Her last normal bm was last Tuesday and the abd pain started Wednesday.  No blood seen in stool.    Review of Systems  Constitutional: Negative for diaphoresis, appetite change, fatigue and unexpected weight change.  Eyes: Negative for pain, redness and visual disturbance.  Respiratory: Negative for cough, chest tightness, shortness of breath and wheezing.   Cardiovascular: Negative for chest pain, palpitations and leg swelling.  Gastrointestinal: Positive for nausea, abdominal pain and constipation. Negative for diarrhea, blood in stool and anal bleeding.  Endocrine: Negative for cold intolerance, heat intolerance, polydipsia, polyphagia and polyuria.  Genitourinary: Negative for dysuria, frequency and difficulty urinating.  Neurological: Negative for dizziness, light-headedness, numbness and headaches.  Psychiatric/Behavioral: Positive for dysphoric mood. Negative for suicidal ideas, behavioral problems, confusion, sleep disturbance, self-injury, decreased concentration and agitation. The patient is nervous/anxious.     History Past Medical History  Diagnosis Date  . Glaucoma   . Arthritis   . Leukocytosis, unspecified 11/15/2012  . Chronic neutrophilia 11/15/2012  . Pruritus 11/15/2012  . Monocytosis 11/15/2012  . Myeloproliferative disorder ([place]) 03/08/2013  . Precordial chest pain   . Family history of heart disease     mother had stents placed in her 79s and had myocardial infarctions  . Radial artery occlusion, right (HCC)     status post  right radial artery cardiac catheterization  . Blood dyscrasia     produces too many WBCs  . Vaginal delivery 1986, 2000  . Hypertension     She has past surgical history that includes Tubal ligation (2000); Eye surgery; Cardiac catheterization  (N/A, 10/03/2014); and Dilatation & currettage/hysteroscopy with novasure ablation (N/A, 11/08/2014).   Her family history includes Cancer in her father; Dementia in her maternal grandmother; Diabetes in her father; Heart disease in her cousin, maternal grandmother, and mother; Heart disease (age of onset: 74) in her maternal grandfather; Hyperlipidemia in her mother; Hypertension in her mother. There is no history of Other.She reports that she has quit smoking. Her smoking use included Cigarettes. She started smoking about 4 months ago. She has never used smokeless tobacco. She reports that she drinks about 3.6 oz of alcohol per week. She reports that she does not use illicit drugs.  Current Outpatient Prescriptions on File Prior to Visit  Medication Sig Dispense Refill  . amLODipine (NORVASC) 5 MG tablet Take 1 tablet (5 mg total) by mouth daily. 90 tablet 3  . aspirin 81 MG tablet Take 81 mg by mouth daily.    Marland Kitchen doxepin (SINEQUAN) 50 MG capsule TAKE 1 CAPSULE BY MOUTH AT BEDTIME 30 capsule 3  . Flaxseed, Linseed, (FLAX SEED OIL) 1000 MG CAPS Take 1 capsule by mouth 3 (three) times daily.    Marland Kitchen HYDROcodone-acetaminophen (NORCO) 5-325 MG tablet Take 1-2 tablets by mouth every 6 (six) hours as needed for moderate pain. 30 tablet 0  . Magnesium 500 MG TABS Take 1 tablet by mouth daily.    . Melatonin 5 MG TABS Take 1 tablet by mouth as needed (sleep).     . mirabegron ER (MYRBETRIQ) 50 MG TB24 tablet Take 1 tablet (50 mg total) by mouth daily. 30 tablet 5  . Omega-3 Fatty Acids (FISH OIL) 1000 MG CAPS Take 1 capsule by mouth every morning.     Marland Kitchen  omeprazole (PRILOSEC) 20 MG capsule TAKE 1 CAPSULE (20 MG TOTAL) BY MOUTH DAILY. 30 capsule 11  . ondansetron (ZOFRAN ODT) 8 MG disintegrating tablet Take 1 tablet (8 mg total) by mouth every 8 (eight) hours as needed for nausea or vomiting. 15 tablet 0  . PEGASYS 180 MCG/ML injection Inject 0.45 mLs (81 mcg total) into the skin every 7 (seven) days. 4 mL 3    Current Facility-Administered Medications on File Prior to Visit  Medication Dose Route Frequency Provider Last Rate Last Dose  . acetaminophen (TYLENOL) tablet 650 mg  650 mg Oral Once [name], MD   650 mg at 04/26/14 1516  . peginterferon alfa-2a (PEGASYS) injection 81 mcg  81 mcg Subcutaneous Weekly [name], MD   81 mcg at 04/26/14 1530     Objective:  Objective Physical Exam  Constitutional: She is oriented to person, place, and time. She appears well-developed and well-nourished.  HENT:  Head: Normocephalic and atraumatic.  Eyes: Conjunctivae and EOM are normal.  Neck: Normal range of motion. Neck supple. No JVD present. Carotid bruit is not present. No thyromegaly present.  Cardiovascular: Normal rate, regular rhythm and normal heart sounds.   No murmur heard. Pulmonary/Chest: Effort normal and breath sounds normal. No respiratory distress. She has no wheezes. She has no rales. She exhibits no tenderness.  Abdominal: She exhibits no mass. There is tenderness. There is guarding.  Musculoskeletal: She exhibits no edema.  Neurological: She is alert and oriented to person, place, and time.  Psychiatric: She has a normal mood and affect. Her behavior is normal. Judgment and thought content normal.  Nursing note and vitals reviewed.  BP 130/88 mmHg  Pulse 70  Temp(Src) 98.1 F (36.7 C) (Oral)  Wt 228 lb (103.42 kg)  SpO2 99% Wt Readings from Last 3 Encounters:  01/14/15 228 lb (103.42 kg)  01/08/15 234 lb (106.142 kg)  12/31/14 232 lb (105.235 kg)     Lab Results  Component Value Date   WBC 20.7* 01/08/2015   HGB 12.4 01/08/2015   HCT 37.2 01/08/2015   PLT 287 01/08/2015   GLUCOSE 96 01/08/2015   CHOL 146 10/15/2014   TRIG 102.0 10/15/2014   HDL 40.70 10/15/2014   LDLCALC 85 10/15/2014   ALT 8 01/08/2015   AST 11 01/08/2015   NA 135 01/08/2015   K 4.4 01/08/2015   CL 102 01/08/2015   CREATININE 0.69 01/08/2015   BUN 15 01/08/2015   CO2 25  01/08/2015   TSH 2.91 10/15/2014   INR 0.91 10/01/2014    Ct Renal Stone Study  01/09/2015  CLINICAL DATA:  Left sided flank pain for 2 days, hematuria EXAM: CT ABDOMEN AND PELVIS WITHOUT CONTRAST TECHNIQUE: Multidetector CT imaging of the abdomen and pelvis was performed following the standard protocol without IV contrast. COMPARISON:  11/15/2012. FINDINGS: The lung bases are free of acute infiltrate or sizable effusion. The liver, gallbladder, spleen, adrenal glands and pancreas are all normal in their CT appearance. The kidneys are well visualized bilaterally and reveal no renal calculi. A vague hypodensity is noted in the upper pole of the left kidney posteriorly consistent with a cyst as seen previously. No obstructive changes are noted. The bladder is decompressed. No ureteral calculi are seen. The uterus and ovaries appear within normal limits. No free pelvic fluid is noted. Scattered diverticular change is seen without evidence of diverticulitis. The appendix is within normal limits. No acute bony abnormality is seen. Mild aortoiliac calcifications are seen.  IMPRESSION: No acute abnormality noted. Electronically Signed   By: Inez Catalina M.D.   On: 01/09/2015 18:07     Assessment & Plan:  Plan I have discontinued Brianna Fletcher's azithromycin and guaiFENesin-codeine. I am also having her maintain her doxepin, Fish Oil, aspirin, Melatonin, PEGASYS, mirabegron ER, Magnesium, Flax Seed Oil, amLODipine, HYDROcodone-acetaminophen, ondansetron, and omeprazole.  No orders of the defined types were placed in this encounter.    Problem List Items Addressed This Visit    Constipation    Pt was on narcotic meds-- suspect that is the cause though pt states they have never affected her like that before Inc po fluids Miralax, fiber      Abdominal pain - Primary    persistant Ct abd/ pelvis If pain worsens---- go to ER      Relevant Orders   CT Abdomen Pelvis W Contrast (Completed)    Other  Visit Diagnoses    Hematuria        Relevant Orders    POCT urinalysis dipstick (Completed)       Follow-up: Return if symptoms worsen or fail to improve.  Garnet Koyanagi, DO

## 2015-01-14 NOTE — Assessment & Plan Note (Signed)
Pt was on narcotic meds-- suspect that is the cause though pt states they have never affected her like that before Inc po fluids Miralax, fiber

## 2015-01-15 ENCOUNTER — Encounter: Payer: Self-pay | Admitting: Family Medicine

## 2015-01-15 ENCOUNTER — Encounter: Payer: Self-pay | Admitting: Physician Assistant

## 2015-01-15 ENCOUNTER — Encounter: Payer: Self-pay | Admitting: Vascular Surgery

## 2015-01-15 ENCOUNTER — Other Ambulatory Visit: Payer: Self-pay | Admitting: Nurse Practitioner

## 2015-01-15 ENCOUNTER — Other Ambulatory Visit (HOSPITAL_COMMUNITY): Payer: BLUE CROSS/BLUE SHIELD

## 2015-01-15 ENCOUNTER — Other Ambulatory Visit: Payer: Self-pay

## 2015-01-15 DIAGNOSIS — R109 Unspecified abdominal pain: Secondary | ICD-10-CM

## 2015-01-15 DIAGNOSIS — D72828 Other elevated white blood cell count: Secondary | ICD-10-CM

## 2015-01-15 MED ORDER — PEGASYS 180 MCG/ML ~~LOC~~ SOLN: 81.0000 ug | SUBCUTANEOUS | Status: DC

## 2015-01-15 NOTE — Telephone Encounter (Signed)
See result note.    KP  

## 2015-01-16 ENCOUNTER — Encounter: Payer: Self-pay | Admitting: Family Medicine

## 2015-01-17 ENCOUNTER — Encounter: Payer: Self-pay | Admitting: Vascular Surgery

## 2015-01-17 ENCOUNTER — Other Ambulatory Visit: Payer: Self-pay | Admitting: Vascular Surgery

## 2015-01-17 ENCOUNTER — Ambulatory Visit (HOSPITAL_COMMUNITY)
Admission: RE | Admit: 2015-01-17 | Discharge: 2015-01-17 | Disposition: A | Discharge status: Home / Self Care | Payer: BLUE CROSS/BLUE SHIELD | Source: Ambulatory Visit | Attending: Vascular Surgery | Admitting: Vascular Surgery

## 2015-01-17 ENCOUNTER — Ambulatory Visit (INDEPENDENT_AMBULATORY_CARE_PROVIDER_SITE_OTHER): Payer: BLUE CROSS/BLUE SHIELD | Admitting: Vascular Surgery

## 2015-01-17 VITALS — BP 135/60 | HR 66 | Ht 67.0 in | Wt 229.2 lb

## 2015-01-17 DIAGNOSIS — I742 Embolism and thrombosis of arteries of the upper extremities: Secondary | ICD-10-CM | POA: Insufficient documentation

## 2015-01-17 DIAGNOSIS — I70208 Unspecified atherosclerosis of native arteries of extremities, other extremity: Secondary | ICD-10-CM

## 2015-01-17 NOTE — Telephone Encounter (Signed)
Pt is requesting a call back. She says that she shows that there is a response to her message on My Chart but her system is down so she cant view it.      CB: [phone]

## 2015-01-17 NOTE — Progress Notes (Signed)
Radial Artery Occlusion  History of Present Illness  Brianna Fletcher is a 44 y.o. year old female who presents for follow=up R arm arterial duplex studies.  This patient had a radial artery occlusion after a radial cath by Dr. Gwenlyn Fletcher for cardiac diagnostic purposes.   The patient's sx have greatly improved since the initial event.  She is able to work which involves typing and is able to complete ADL.  PMH, PSH, [place], and SH remain unchg'ed from her recent visit.  Current Outpatient Prescriptions  Medication Sig Dispense Refill  . amLODipine (NORVASC) 5 MG tablet Take 1 tablet (5 mg total) by mouth daily. 90 tablet 3  . aspirin 81 MG tablet Take 81 mg by mouth daily.    Marland Kitchen doxepin (SINEQUAN) 50 MG capsule TAKE 1 CAPSULE BY MOUTH AT BEDTIME 30 capsule 3  . Flaxseed, Linseed, (FLAX SEED OIL) 1000 MG CAPS Take 1 capsule by mouth 3 (three) times daily.    Marland Kitchen HYDROcodone-acetaminophen (NORCO) 5-325 MG tablet Take 1-2 tablets by mouth every 6 (six) hours as needed for moderate pain. 30 tablet 0  . Magnesium 500 MG TABS Take 1 tablet by mouth daily.    . Melatonin 5 MG TABS Take 1 tablet by mouth as needed (sleep).     . mirabegron ER (MYRBETRIQ) 50 MG TB24 tablet Take 1 tablet (50 mg total) by mouth daily. 30 tablet 5  . Omega-3 Fatty Acids (FISH OIL) 1000 MG CAPS Take 1 capsule by mouth every morning.     Marland Kitchen omeprazole (PRILOSEC) 20 MG capsule TAKE 1 CAPSULE (20 MG TOTAL) BY MOUTH DAILY. 30 capsule 11  . ondansetron (ZOFRAN ODT) 8 MG disintegrating tablet Take 1 tablet (8 mg total) by mouth every 8 (eight) hours as needed for nausea or vomiting. 15 tablet 0  . PEGASYS 180 MCG/ML injection Inject 0.45 mLs (81 mcg total) into the skin every 7 (seven) days. 4 mL 3  . azithromycin (ZITHROMAX) 250 MG tablet     . GUAIATUSSIN AC 100-10 MG/5ML syrup     . ibuprofen (ADVIL,MOTRIN) 800 MG tablet     . losartan-hydrochlorothiazide (HYZAAR) 50-12.5 MG tablet     . oxyCODONE-acetaminophen (PERCOCET)  7.5-325 MG tablet      No current facility-administered medications for this visit.   Facility-Administered Medications Ordered in Other Visits  Medication Dose Route Frequency Provider Last Rate Last Dose  . acetaminophen (TYLENOL) tablet 650 mg  650 mg Oral Once Brianna Napoleon, MD   650 mg at 04/26/14 1516  . peginterferon alfa-2a (PEGASYS) injection 81 mcg  81 mcg Subcutaneous Weekly Brianna Napoleon, MD   81 mcg at 04/26/14 1530   Allergies  Allergen Reactions  . Lisinopril Cough  . Losartan Cough    On ROS: abd pain, able to complete ADL   Physical Examination Filed Vitals:   01/17/15 1549  BP: 135/60  Pulse: 66   LUE: pink right hand, CR < 2 sec, palpable ulnar pulse, dopplerable palmar arch  BUE arterial doppler (01/17/2015)  R: triphasic throughout except radial artery, waveform consistent with reconstitution of flow via palmar arch, WBI 1.16  L: triphasic throughout, WBI 1.18   Medical Decision Making  Brianna Fletcher is a 44 y.o. year old female who presents s/p radial cath complicated by radial occlusion .  While the absolute magnitudes of the digital waveforms in the right hand are a little less than left, essential the right hand is fully perfused via the  palmar arch via the ulnar artery.  No further intervention on the radial artery is necessary in this patient at this time.  Thank you for allowing [place] to participate in this patient's care.  Brianna Barthel, MD [facility] Office: [phone] Pager: [phone]  01/17/2015, 4:08 PM

## 2015-01-22 ENCOUNTER — Encounter: Payer: Self-pay | Admitting: Family Medicine

## 2015-01-23 ENCOUNTER — Encounter: Payer: Self-pay | Admitting: Family Medicine

## 2015-01-27 ENCOUNTER — Encounter: Payer: Self-pay | Admitting: Family Medicine

## 2015-01-27 NOTE — Telephone Encounter (Signed)
Patient is concerned b/c ,you sent he a message stating she needs to get the papework complete by the GI doctor on 01/27/15 but on 01/24/15 you sent me a staff message and advised me to tell her you would complete the FMLA for her. Please advise     KP

## 2015-01-27 NOTE — Telephone Encounter (Signed)
I thought this was in reference to more paper work

## 2015-01-29 ENCOUNTER — Other Ambulatory Visit (HOSPITAL_BASED_OUTPATIENT_CLINIC_OR_DEPARTMENT_OTHER): Payer: BLUE CROSS/BLUE SHIELD

## 2015-01-29 ENCOUNTER — Telehealth: Payer: Self-pay | Admitting: Family Medicine

## 2015-01-29 DIAGNOSIS — D471 Chronic myeloproliferative disease: Secondary | ICD-10-CM

## 2015-01-29 LAB — COMPREHENSIVE METABOLIC PANEL
ALBUMIN: 4.2 g/dL (ref 3.6–5.1)
ALT: 8 U/L (ref 6–29)
AST: 13 U/L (ref 10–35)
Alkaline Phosphatase: 119 U/L — ABNORMAL HIGH (ref 33–115)
BUN: 12 mg/dL (ref 7–25)
CALCIUM: 10.2 mg/dL (ref 8.6–10.2)
CHLORIDE: 104 mmol/L (ref 98–110)
CO2: 25 mmol/L (ref 20–31)
Creatinine, Ser: 0.59 mg/dL (ref 0.50–1.10)
Glucose, Bld: 109 mg/dL — ABNORMAL HIGH (ref 65–99)
POTASSIUM: 3.8 mmol/L (ref 3.5–5.3)
Sodium: 137 mmol/L (ref 135–146)
TOTAL PROTEIN: 7.2 g/dL (ref 6.1–8.1)
Total Bilirubin: 0.5 mg/dL (ref 0.2–1.2)

## 2015-01-29 LAB — CBC WITH DIFFERENTIAL (CANCER CENTER ONLY)
BASO#: 0 10*3/uL (ref 0.0–0.2)
BASO%: 0.1 % (ref 0.0–2.0)
EOS%: 0.4 % (ref 0.0–7.0)
Eosinophils Absolute: 0.1 10*3/uL (ref 0.0–0.5)
HEMATOCRIT: 39.3 % (ref 34.8–46.6)
HEMOGLOBIN: 13 g/dL (ref 11.6–15.9)
LYMPH#: 2.3 10*3/uL (ref 0.9–3.3)
LYMPH%: 12.1 % — AB (ref 14.0–48.0)
MCH: 31.1 pg (ref 26.0–34.0)
MCHC: 33.1 g/dL (ref 32.0–36.0)
MCV: 94 fL (ref 81–101)
MONO#: 1.1 10*3/uL — AB (ref 0.1–0.9)
MONO%: 5.7 % (ref 0.0–13.0)
NEUT#: 15.3 10*3/uL — ABNORMAL HIGH (ref 1.5–6.5)
NEUT%: 81.7 % — AB (ref 39.6–80.0)
Platelets: 252 10*3/uL (ref 145–400)
RBC: 4.18 10*6/uL (ref 3.70–5.32)
RDW: 12.8 % (ref 11.1–15.7)
WBC: 18.7 10*3/uL — AB (ref 3.9–10.0)

## 2015-01-29 LAB — CHCC SATELLITE - SMEAR

## 2015-01-29 LAB — LACTATE DEHYDROGENASE: LDH: 179 U/L (ref 94–250)

## 2015-01-29 NOTE — Telephone Encounter (Signed)
Pt dropped off FMLA paperwork, states she filled out some of it, pt states she does not need a copy, just fax directly to company, paperwork was placed in your tray at the front desk

## 2015-01-29 NOTE — Telephone Encounter (Signed)
Routed to Murtis Sink, [facility] Specialist.

## 2015-01-30 ENCOUNTER — Other Ambulatory Visit: Payer: BLUE CROSS/BLUE SHIELD

## 2015-01-30 LAB — FERRITIN CHCC: FERRITIN: 98 ng/mL (ref 9–269)

## 2015-01-30 LAB — IRON AND TIBC CHCC
%SAT: 24 % (ref 21–57)
Iron: 72 ug/dL (ref 41–142)
TIBC: 303 ug/dL (ref 236–444)
UIBC: 230 ug/dL (ref 120–384)

## 2015-01-30 NOTE — Telephone Encounter (Signed)
Filled out as much as possible, forwarded to Dr. Etter Sjogren. JG//CMA

## 2015-01-31 ENCOUNTER — Other Ambulatory Visit: Payer: BLUE CROSS/BLUE SHIELD

## 2015-02-05 ENCOUNTER — Ambulatory Visit: Payer: BLUE CROSS/BLUE SHIELD | Admitting: Physician Assistant

## 2015-02-05 NOTE — Telephone Encounter (Signed)
Forms faxed to [facility] successfully. Sent for scanning. JG//CMA

## 2015-02-06 ENCOUNTER — Ambulatory Visit: Payer: BLUE CROSS/BLUE SHIELD | Admitting: Family Medicine

## 2015-02-06 ENCOUNTER — Encounter: Payer: Self-pay | Admitting: Family Medicine

## 2015-02-11 ENCOUNTER — Ambulatory Visit: Payer: BLUE CROSS/BLUE SHIELD | Admitting: Hematology & Oncology

## 2015-02-11 ENCOUNTER — Other Ambulatory Visit: Payer: BLUE CROSS/BLUE SHIELD

## 2015-02-12 ENCOUNTER — Ambulatory Visit (INDEPENDENT_AMBULATORY_CARE_PROVIDER_SITE_OTHER): Payer: BLUE CROSS/BLUE SHIELD | Admitting: Medical

## 2015-02-12 ENCOUNTER — Other Ambulatory Visit: Payer: Self-pay | Admitting: Medical

## 2015-02-12 ENCOUNTER — Other Ambulatory Visit: Payer: Self-pay | Admitting: Hematology & Oncology

## 2015-02-12 ENCOUNTER — Encounter: Payer: Self-pay | Admitting: Medical

## 2015-02-12 VITALS — BP 124/80 | HR 61 | Temp 97.8°F | Ht 67.0 in | Wt 227.0 lb

## 2015-02-12 DIAGNOSIS — N9489 Other specified conditions associated with female genital organs and menstrual cycle: Secondary | ICD-10-CM

## 2015-02-12 DIAGNOSIS — R102 Pelvic and perineal pain: Secondary | ICD-10-CM

## 2015-02-12 DIAGNOSIS — R1030 Lower abdominal pain, unspecified: Secondary | ICD-10-CM | POA: Diagnosis not present

## 2015-02-12 DIAGNOSIS — M25559 Pain in unspecified hip: Secondary | ICD-10-CM

## 2015-02-12 DIAGNOSIS — R319 Hematuria, unspecified: Secondary | ICD-10-CM

## 2015-02-12 LAB — POCT URINALYSIS DIPSTICK
Bilirubin, UA: NEGATIVE
GLUCOSE UA: NEGATIVE
KETONES UA: NEGATIVE
Leukocytes, UA: NEGATIVE
Nitrite, UA: NEGATIVE
Protein, UA: NEGATIVE
SPEC GRAV UA: 1.025
Urobilinogen, UA: 0.2
pH, UA: 6

## 2015-02-12 NOTE — Progress Notes (Signed)
Pre visit review using our clinic review tool, if applicable. No additional management support is needed unless otherwise documented below in the visit note. 

## 2015-02-12 NOTE — Addendum Note (Signed)
Addended by: Tasia Catchings on: 02/12/2015 03:30 PM   Modules accepted: Orders

## 2015-02-12 NOTE — Progress Notes (Signed)
Subjective:    Patient ID: Brianna Fletcher, female    DOB: April 13, 1970, 44 y.o.   MRN: [id number]  HPI  Pt in for follow up. Initially had some flank pain in October. I treated her for kidney stone. CT scan was negative. Pt saw Dr. Etter Sjogren after I saw her. Second Ct of abdomen with contrast was also negative. So pt was then referred to GI. Pt had had some diverticulosis on her colonosocopy. Some internal hemorrhoid as all. GI per pt report though diverticulosis not responsible for her symptoms.  Pt pain states later in the week that I saw her she began to develop pain that is mostly in suprapubic area. Sometimes pain associated with passing stool. No pain when urinating. Pain can last 30 minutes up to 3 hours. Pt states pain not associated after meals. Sometimes pain is present even before she eats.   Pt only had tubal ligation in past. Pt has had an ablation in August. Ultrasound  in the summer but none since then. Pt denies any history of endometriosis.   Pt pain is on and off. Last time she had pain was on Sunday. Pain rarely happens after she eats. She has pain that occurs about every 2-3 days.  Pt states she needs some fmla paperwork filled out. She has missed 5 wks.(I advised get her pcp to fill out. If they(employer) can't wait then I can fill out)  Pt will loose job at beginning of the year.    Review of Systems  Constitutional: Negative for fever, chills and fatigue.  Respiratory: Negative for cough, chest tightness, shortness of breath and wheezing.   Cardiovascular: Negative for chest pain and palpitations.  Gastrointestinal: Positive for abdominal pain. Negative for diarrhea, constipation, blood in stool and rectal pain.  Musculoskeletal: Negative for back pain.  Skin: Negative for pallor.  Neurological: Negative for dizziness, weakness, numbness and headaches.  Hematological: Negative for adenopathy. Does not bruise/bleed easily.  Psychiatric/Behavioral: Negative for behavioral  problems and confusion.   Past Medical History  Diagnosis Date  . Glaucoma   . Arthritis   . Leukocytosis, unspecified 11/15/2012  . Chronic neutrophilia 11/15/2012  . Pruritus 11/15/2012  . Monocytosis 11/15/2012  . Myeloproliferative disorder ([place]) 03/08/2013  . Precordial chest pain   . Family history of heart disease     mother had stents placed in her 61s and had myocardial infarctions  . Radial artery occlusion, right (HCC)     status post  right radial artery cardiac catheterization  . Blood dyscrasia     produces too many WBCs  . Vaginal delivery 1986, 2000  . Hypertension     Social History   Social History  . Marital Status: Divorced    Spouse Name: N/A  . Number of Children: N/A  . Years of Education: N/A   Occupational History  . Not on file.   Social History Main Topics  . Smoking status: Former Smoker    Types: Cigarettes    Start date: 08/21/2014  . Smokeless tobacco: Never Used     Comment: patient reports light tobacco use, has not smoked since friday (09/27/14)  . Alcohol Use: 3.6 oz/week    6 Cans of beer per week  . Drug Use: No  . Sexual Activity:    Partners: Male   Other Topics Concern  . Not on file   Social History Narrative    Past Surgical History  Procedure Laterality Date  . Tubal ligation  2000  .  Eye surgery      laser eye surgery  . Cardiac catheterization N/A 10/03/2014    Procedure: Left Heart Cath and Coronary Angiography;  Surgeon: Lorretta Harp, MD;  Location: [facility];  Service: Cardiovascular;  Laterality: N/A;  . Dilitation & currettage/hystroscopy with novasure ablation N/A 11/08/2014    Procedure: DILATATION & CURETTAGE/HYSTEROSCOPY WITH NOVASURE ABLATION;  Surgeon: Linda Hedges, DO;  Location: [facility];  Service: Gynecology;  Laterality: N/A;    Family History  Problem Relation Age of Onset  . Heart disease Mother   . Hypertension Mother   . Hyperlipidemia Mother   . Diabetes Father   . Cancer Father      esopageal ?  . Dementia Maternal Grandmother   . Heart disease Maternal Grandmother   . Heart disease Maternal Grandfather 18    MI  . Other Neg Hx     hyperparathyroidism  . Heart disease Cousin     Allergies  Allergen Reactions  . Lisinopril Cough  . Losartan Cough    Current Outpatient Prescriptions on File Prior to Visit  Medication Sig Dispense Refill  . amLODipine (NORVASC) 5 MG tablet Take 1 tablet (5 mg total) by mouth daily. 90 tablet 3  . aspirin 81 MG tablet Take 81 mg by mouth daily.    . Flaxseed, Linseed, (FLAX SEED OIL) 1000 MG CAPS Take 1 capsule by mouth 3 (three) times daily.    Marland Kitchen HYDROcodone-acetaminophen (NORCO) 5-325 MG tablet Take 1-2 tablets by mouth every 6 (six) hours as needed for moderate pain. 30 tablet 0  . ibuprofen (ADVIL,MOTRIN) 800 MG tablet     . losartan-hydrochlorothiazide (HYZAAR) 50-12.5 MG tablet     . Magnesium 500 MG TABS Take 1 tablet by mouth daily.    . Melatonin 5 MG TABS Take 1 tablet by mouth as needed (sleep).     . mirabegron ER (MYRBETRIQ) 50 MG TB24 tablet Take 1 tablet (50 mg total) by mouth daily. 30 tablet 5  . Omega-3 Fatty Acids (FISH OIL) 1000 MG CAPS Take 1 capsule by mouth every morning.     Marland Kitchen omeprazole (PRILOSEC) 20 MG capsule TAKE 1 CAPSULE (20 MG TOTAL) BY MOUTH DAILY. 30 capsule 11  . ondansetron (ZOFRAN ODT) 8 MG disintegrating tablet Take 1 tablet (8 mg total) by mouth every 8 (eight) hours as needed for nausea or vomiting. 15 tablet 0  . oxyCODONE-acetaminophen (PERCOCET) 7.5-325 MG tablet     . PEGASYS 180 MCG/ML injection Inject 0.45 mLs (81 mcg total) into the skin every 7 (seven) days. 4 mL 3   Current Facility-Administered Medications on File Prior to Visit  Medication Dose Route Frequency Provider Last Rate Last Dose  . acetaminophen (TYLENOL) tablet 650 mg  650 mg Oral Once [name], MD   650 mg at 04/26/14 1516  . peginterferon alfa-2a (PEGASYS) injection 81 mcg  81 mcg Subcutaneous Weekly [name], MD   81 mcg at 04/26/14 1530    BP 124/80 mmHg  Pulse 61  Temp(Src) 97.8 F (36.6 C) (Oral)  Ht 5\' 7"  (1.702 m)  Wt 227 lb (102.967 kg)  BMI 35.55 kg/m2  SpO2 98%       Objective:   Physical Exam  General Appearance- Not in acute distress.  HEENT Eyes- Scleraeral/Conjuntiva-bilat- Not Yellow. Mouth & Throat- Normal.  Chest and Lung Exam Auscultation: Breath sounds:-Normal. Adventitious sounds:- No Adventitious sounds.  Cardiovascular Auscultation:Rythm - Regular. Heart Sounds -Normal heart sounds.  Abdomen  Inspection:-Inspection Normal.  Palpation/Perucssion: Palpation and Percussion of the abdomen reveal- Non Tender(but when occurs is in suprapubic area, No Rebound tenderness, No rigidity(Guarding) and No Palpable abdominal masses.  Liver:-Normal.  Spleen:- Normal.    Back- no cva tenderness.      Assessment & Plan:  I will get urine dip today and culture.  Also order pelvic ultrasound since none done since mid summer.  Will write note not to return to work until further studies completed.  Refer  to urology sine blood in urine today and some in past.  May refer  back to your gynecologist.  Need written report of your colonoscopy.    Follow up one week or as needed

## 2015-02-12 NOTE — Patient Instructions (Addendum)
I will get urine dip today and culture.  Also order pelvic ultrasound since none done since mid summer.  Will write note not to return to work until further studies completed.  Refer  to urology since blood in urine today and some in past.  May refer  back to your gynecologist.  Need written report of your colonoscopy.    Follow up one week or as needed

## 2015-02-14 LAB — URINE CULTURE
COLONY COUNT: NO GROWTH
Organism ID, Bacteria: NO GROWTH

## 2015-02-15 ENCOUNTER — Other Ambulatory Visit: Payer: Self-pay | Admitting: Nurse Practitioner

## 2015-02-17 ENCOUNTER — Ambulatory Visit (HOSPITAL_BASED_OUTPATIENT_CLINIC_OR_DEPARTMENT_OTHER)
Admission: RE | Admit: 2015-02-17 | Discharge: 2015-02-17 | Disposition: A | Discharge status: Home / Self Care | Payer: BLUE CROSS/BLUE SHIELD | Source: Ambulatory Visit | Attending: Medical | Admitting: Medical

## 2015-02-17 DIAGNOSIS — R102 Pelvic and perineal pain: Secondary | ICD-10-CM | POA: Diagnosis not present

## 2015-02-17 DIAGNOSIS — N83209 Unspecified ovarian cyst, unspecified side: Secondary | ICD-10-CM

## 2015-02-17 DIAGNOSIS — D259 Leiomyoma of uterus, unspecified: Secondary | ICD-10-CM | POA: Insufficient documentation

## 2015-02-17 DIAGNOSIS — M25559 Pain in unspecified hip: Secondary | ICD-10-CM

## 2015-02-17 DIAGNOSIS — N9489 Other specified conditions associated with female genital organs and menstrual cycle: Secondary | ICD-10-CM

## 2015-02-17 DIAGNOSIS — R1030 Lower abdominal pain, unspecified: Secondary | ICD-10-CM

## 2015-02-17 NOTE — Telephone Encounter (Signed)
Refer to gyn for fibroids and ovarian cyst. Let pt know referral put in. To determine if this is source of her pain

## 2015-02-19 ENCOUNTER — Ambulatory Visit: Payer: BLUE CROSS/BLUE SHIELD | Admitting: Medical

## 2015-02-20 ENCOUNTER — Other Ambulatory Visit: Payer: BLUE CROSS/BLUE SHIELD

## 2015-02-20 ENCOUNTER — Ambulatory Visit: Payer: BLUE CROSS/BLUE SHIELD | Admitting: Hematology & Oncology

## 2015-02-21 ENCOUNTER — Other Ambulatory Visit (HOSPITAL_BASED_OUTPATIENT_CLINIC_OR_DEPARTMENT_OTHER): Payer: Self-pay | Admitting: Obstetrics & Gynecology

## 2015-02-21 ENCOUNTER — Ambulatory Visit (HOSPITAL_BASED_OUTPATIENT_CLINIC_OR_DEPARTMENT_OTHER): Payer: BLUE CROSS/BLUE SHIELD | Admitting: Hematology & Oncology

## 2015-02-21 ENCOUNTER — Ambulatory Visit: Payer: BLUE CROSS/BLUE SHIELD | Admitting: Medical

## 2015-02-21 ENCOUNTER — Encounter: Payer: Self-pay | Admitting: Hematology & Oncology

## 2015-02-21 ENCOUNTER — Other Ambulatory Visit (HOSPITAL_BASED_OUTPATIENT_CLINIC_OR_DEPARTMENT_OTHER): Payer: BLUE CROSS/BLUE SHIELD

## 2015-02-21 VITALS — BP 144/59 | HR 74 | Temp 97.8°F | Resp 16 | Ht 67.0 in | Wt 228.0 lb

## 2015-02-21 DIAGNOSIS — D471 Chronic myeloproliferative disease: Secondary | ICD-10-CM

## 2015-02-21 DIAGNOSIS — Z1231 Encounter for screening mammogram for malignant neoplasm of breast: Secondary | ICD-10-CM

## 2015-02-21 DIAGNOSIS — N83209 Unspecified ovarian cyst, unspecified side: Secondary | ICD-10-CM

## 2015-02-21 DIAGNOSIS — N852 Hypertrophy of uterus: Secondary | ICD-10-CM

## 2015-02-21 LAB — CBC WITH DIFFERENTIAL (CANCER CENTER ONLY)
BASO#: 0 10*3/uL (ref 0.0–0.2)
BASO%: 0.1 % (ref 0.0–2.0)
EOS ABS: 0.1 10*3/uL (ref 0.0–0.5)
EOS%: 0.5 % (ref 0.0–7.0)
HCT: 39.9 % (ref 34.8–46.6)
HEMOGLOBIN: 13 g/dL (ref 11.6–15.9)
LYMPH#: 2.2 10*3/uL (ref 0.9–3.3)
LYMPH%: 11.1 % — AB (ref 14.0–48.0)
MCH: 31.4 pg (ref 26.0–34.0)
MCHC: 32.6 g/dL (ref 32.0–36.0)
MCV: 96 fL (ref 81–101)
MONO#: 0.9 10*3/uL (ref 0.1–0.9)
MONO%: 4.4 % (ref 0.0–13.0)
NEUT%: 83.9 % — ABNORMAL HIGH (ref 39.6–80.0)
NEUTROS ABS: 16.5 10*3/uL — AB (ref 1.5–6.5)
PLATELETS: 259 10*3/uL (ref 145–400)
RBC: 4.14 10*6/uL (ref 3.70–5.32)
RDW: 13.2 % (ref 11.1–15.7)
WBC: 19.6 10*3/uL — AB (ref 3.9–10.0)

## 2015-02-21 LAB — COMPREHENSIVE METABOLIC PANEL
ALBUMIN: 4 g/dL (ref 3.5–5.0)
ALK PHOS: 148 U/L (ref 40–150)
ALT: 9 U/L (ref 0–55)
AST: 18 U/L (ref 5–34)
Anion Gap: 12 mEq/L — ABNORMAL HIGH (ref 3–11)
BILIRUBIN TOTAL: 0.53 mg/dL (ref 0.20–1.20)
BUN: 12.3 mg/dL (ref 7.0–26.0)
CO2: 22 meq/L (ref 22–29)
CREATININE: 0.8 mg/dL (ref 0.6–1.1)
Calcium: 10.5 mg/dL — ABNORMAL HIGH (ref 8.4–10.4)
Chloride: 105 mEq/L (ref 98–109)
GLUCOSE: 120 mg/dL (ref 70–140)
Potassium: 4 mEq/L (ref 3.5–5.1)
SODIUM: 139 meq/L (ref 136–145)
TOTAL PROTEIN: 7.9 g/dL (ref 6.4–8.3)

## 2015-02-21 NOTE — Progress Notes (Signed)
Hematology and Oncology Follow Up Visit  Brianna Fletcher:[id number] Dec 18, 1970 44 y.o. 02/21/2015   Principle Diagnosis:  Chronic myeloproliferative syndrome-Triple negative  Current Therapy:   Peg-Interferon q weekly - q 3 week dosing.     Interim History:  Ms.  Brianna Fletcher is back for followup. She is tolerating the peg-interferon well.   The big news for her that she will will be having a hysterectomy on December 14. Since we last saw her, she did have a lot of pelvic pain. She ultimately had a ultrasound done which showed that her uterus was twice the size is normal. She has some ovarian cysts. There is no obvious malignancy that has been seen.  She has had an endometrial ablation. This seemed to help with some of her bleeding. However, she now is having more problems with pain.  She will get her interferon today. I will then hold off on interferon until we see her back after her surgery.  She has had no problems with cough. Is no shortness of breath. [name] had no obvious change in bowel or bladder habits. [name] had no diarrhea. [name] had no leg swelling. [name] had no fever. e's had no leg swelling. [name] had no cough.  Overall, her performance status is ECOG 1.   Medications:  Current outpatient prescriptions:  .  amLODipine (NORVASC) 5 MG tablet, Take 1 tablet (5 mg total) by mouth daily., Disp: 90 tablet, Rfl: 3 .  aspirin 81 MG tablet, Take 81 mg by mouth daily., Disp: , Rfl:  .  doxepin (SINEQUAN) 50 MG capsule, TAKE 1 CAPSULE BY MOUTH AT BEDTIME, Disp: 30 capsule, Rfl: 3 .  Flaxseed, Linseed, (FLAX SEED OIL) 1000 MG CAPS, Take 1 capsule by mouth 3 (three) times daily., Disp: , Rfl:  .  Magnesium 500 MG TABS, Take 1 tablet by mouth daily., Disp: , Rfl:  .  Melatonin 5 MG TABS, Take 1 tablet by mouth as needed (sleep). , Disp: , Rfl:  .  mirabegron ER (MYRBETRIQ) 50 MG TB24 tablet, Take 1 tablet (50 mg total) by mouth daily., Disp: 30 tablet, Rfl: 5 .  Omega-3 Fatty Acids (FISH  OIL) 1000 MG CAPS, Take 1 capsule by mouth every morning. , Disp: , Rfl:  .  omeprazole (PRILOSEC) 20 MG capsule, TAKE 1 CAPSULE (20 MG TOTAL) BY MOUTH DAILY., Disp: 30 capsule, Rfl: 11 .  PEGASYS 180 MCG/ML injection, Inject 0.45 mLs (81 mcg total) into the skin every 7 (seven) days., Disp: 4 mL, Rfl: 3 .  Vitamin D, Ergocalciferol, (DRISDOL) 50000 UNITS CAPS capsule, Take 50,000 Units by mouth once a week., Disp: , Rfl: 0 No current facility-administered medications for this visit.  Facility-Administered Medications Ordered in Other Visits:  .  acetaminophen (TYLENOL) tablet 650 mg, 650 mg, Oral, Once, [name], MD, 650 mg at 04/26/14 1516 .  peginterferon alfa-2a (PEGASYS) injection 81 mcg, 81 mcg, Subcutaneous, Weekly, [name], MD, 81 mcg at 04/26/14 1530  Allergies:  Allergies  Allergen Reactions  . Lisinopril Cough  . Losartan Cough    Past Medical History, Surgical history, Social history, and Family History were reviewed and updated.  Review of Systems: As above  Physical Exam:  height is 5\' 7"  (1.702 m) and weight is 228 lb (103.42 kg). Her oral temperature is 97.8 F (36.6 C). Her blood pressure is 144/59 and her pulse is 74. Her respiration is 16.   Well-developed and well-nourished white female. Head and neck exam shows no  ocular or oral lesions. She has no palpable cervical or supraclavicular lymph nodes. Lungs are clear. Cardiac exam regular rate and rhythm with no murmurs rubs or bruits. Abdomen is soft. She has good bowel sounds. There is no fluid wave. There is no palpable liver or spleen tip. Back exam shows no tenderness over the spine or ribs. There is some tenderness over the right hip area. This is chronic. She has some slight decreased range of motion of the right hip. Extremities shows no clubbing, cyanosis or edema. Skin exam no rashes, ecchymosis or petechia. Her skin is a little dry. Neurological exam shows no focal neurological deficits.  Lab  Results  Component Value Date   WBC 19.6* 02/21/2015   HGB 13.0 02/21/2015   HCT 39.9 02/21/2015   MCV 96 02/21/2015   PLT 259 02/21/2015     Chemistry      Component Value Date/Time   NA 137 01/29/2015 1500   NA 141 12/31/2014 0802   NA 145 10/04/2014 1357   K 3.8 01/29/2015 1500   K 4.2 12/31/2014 0802   K 4.1 10/04/2014 1357   CL 104 01/29/2015 1500   CL 107 10/04/2014 1357   CO2 25 01/29/2015 1500   CO2 24 12/31/2014 0802   CO2 26 10/04/2014 1357   BUN 12 01/29/2015 1500   BUN 16.7 12/31/2014 0802   BUN 11 10/04/2014 1357   CREATININE 0.59 01/29/2015 1500   CREATININE 0.69 01/08/2015 1716   CREATININE 0.7 12/31/2014 0802      Component Value Date/Time   CALCIUM 10.2 01/29/2015 1500   CALCIUM 10.1 12/31/2014 0802   CALCIUM 10.3 10/04/2014 1357   ALKPHOS 119* 01/29/2015 1500   ALKPHOS 138 12/31/2014 0802   ALKPHOS 146* 10/04/2014 1357   AST 13 01/29/2015 1500   AST 13 12/31/2014 0802   AST 25 10/04/2014 1357   ALT 8 01/29/2015 1500   ALT 11 12/31/2014 0802   ALT 19 10/04/2014 1357   BILITOT 0.5 01/29/2015 1500   BILITOT 0.53 12/31/2014 0802   BILITOT 0.90 10/04/2014 1357         Impression and Plan: [name] is 44 year old female. She has a myeloproliferative syndrome. So far, our workup has been totally negative.  I will be fasting to see what happens with her white cell count after she has her hysterectomy.  From my point of view, I don't see any problems with her having a hysterectomy. I don't think any special precautions need to be made. I do not believe that she is at a higher risk for thromboembolic disease and a normal patient. As such, I do not think any special anticoagulation measures need to be taken.  I would like to see her back here about 3-4 weeks at her surgery.  She'll get her interferon today and that we will hold off until after she has her surgery and we see her back.   [name], MD 12/2/20168:37 AM

## 2015-02-25 ENCOUNTER — Encounter: Payer: Self-pay | Admitting: Family Medicine

## 2015-02-26 ENCOUNTER — Encounter: Payer: Self-pay | Admitting: Family Medicine

## 2015-02-28 NOTE — Telephone Encounter (Signed)
Can be reached: [phone]  Reason for call: Pt said this is an emergency and she needs to talk with you today.

## 2015-03-03 ENCOUNTER — Other Ambulatory Visit (HOSPITAL_COMMUNITY): Payer: BLUE CROSS/BLUE SHIELD

## 2015-03-03 ENCOUNTER — Telehealth: Payer: Self-pay | Admitting: Family Medicine

## 2015-03-03 NOTE — Telephone Encounter (Signed)
The patient would like for Murtis Sink to call her.       KP

## 2015-03-03 NOTE — Telephone Encounter (Signed)
Relation to [id number] Call back number: [phone]  Reason for call:  Patient would like to speak with you directly regarding disability paperwork.

## 2015-03-03 NOTE — H&P (Signed)
Brianna Fletcher is an 44 y.o. female with post ablation syndrome.  She underwent Novasure ablation 10/2014 and while her bleeding has improved, she has had severe pelvic pain since that time.  She underwent an extensive evaluation to determine the source of her pain including ED visits, GI eval with upper and lower endoscopy and 2 CTs all wnl.  She has been out of work secondary to the nature of her symptoms and desires definitive management. Of note, the patient does have a myeloproliferative disorder and her managing MD has cleared her for this procedure.  Pertinent Gynecological History: Menses: flow is light Bleeding: dysfunctional uterine bleeding Contraception: tubal ligation DES exposure: unknown Blood transfusions: none Sexually transmitted diseases: no past history Previous GYN Procedures: DNC and ablation  Last mammogram: normal Date: 02/2014 Last pap: normal Date: 2016 OB History: G2, P2   Menstrual History: Menarche age: n/a  No LMP recorded. Patient has had an ablation.    Past Medical History  Diagnosis Date  . Glaucoma   . Arthritis   . Leukocytosis, unspecified 11/15/2012  . Chronic neutrophilia 11/15/2012  . Pruritus 11/15/2012  . Monocytosis 11/15/2012  . Myeloproliferative disorder ([place]) 03/08/2013  . Precordial chest pain   . Family history of heart disease     mother had stents placed in her 44s and had myocardial infarctions  . Radial artery occlusion, right (HCC)     status post  right radial artery cardiac catheterization  . Blood dyscrasia     produces too many WBCs  . Vaginal delivery 1986, 2000  . Hypertension     Past Surgical History  Procedure Laterality Date  . Tubal ligation  2000  . Eye surgery      laser eye surgery  . Cardiac catheterization N/A 10/03/2014    Procedure: Left Heart Cath and Coronary Angiography;  Surgeon: Lorretta Harp, MD;  Location: [facility];  Service: Cardiovascular;  Laterality: N/A;  . Dilitation &  currettage/hystroscopy with novasure ablation N/A 11/08/2014    Procedure: DILATATION & CURETTAGE/HYSTEROSCOPY WITH NOVASURE ABLATION;  Surgeon: Linda Hedges, DO;  Location: [facility];  Service: Gynecology;  Laterality: N/A;    Family History  Problem Relation Age of Onset  . Heart disease Mother   . Hypertension Mother   . Hyperlipidemia Mother   . Diabetes Father   . Cancer Father     esopageal ?  . Dementia Maternal Grandmother   . Heart disease Maternal Grandmother   . Heart disease Maternal Grandfather 45    MI  . Other Neg Hx     hyperparathyroidism  . Heart disease Cousin     Social History:  reports that she has quit smoking. Her smoking use included Cigarettes. She started smoking about 6 months ago. She has never used smokeless tobacco. She reports that she drinks about 3.6 oz of alcohol per week. She reports that she does not use illicit drugs.  Allergies:  Allergies  Allergen Reactions  . Lisinopril Cough  . Losartan Cough    No prescriptions prior to admission    ROS  There were no vitals taken for this visit. Physical Exam  Constitutional: She is oriented to person, place, and time. She appears well-developed and well-nourished.  Cardiovascular: Normal rate and regular rhythm.   Respiratory: Effort normal and breath sounds normal.  GI: Soft. Bowel sounds are normal.  Neurological: She is alert and oriented to person, place, and time.  Skin: Skin is warm and dry.  Psychiatric:  She has a normal mood and affect. Her behavior is normal.    No results found for this or any previous visit (from the past 24 hour(s)).  No results found.  Assessment/Plan: 44yo G2P2 with post ablation sydrome -Proceed with LAVH, b/l salpingectomy -Patient is counseled re: risk of bleeding, infection, scarring, and damage to surrounding structures.  She understands that she may continue to have pelvic pain although I do believe this will not be the case.  All questions were  answered and the patient wishes to proceed.  Valery Chance, [place] 03/03/2015, 9:36 AM

## 2015-03-04 ENCOUNTER — Ambulatory Visit (HOSPITAL_COMMUNITY)
Admission: RE | Admit: 2015-03-04 | Discharge: 2015-03-04 | Disposition: A | Discharge status: Home / Self Care | Payer: BLUE CROSS/BLUE SHIELD | Source: Ambulatory Visit | Attending: Obstetrics & Gynecology | Admitting: Obstetrics & Gynecology

## 2015-03-04 ENCOUNTER — Encounter (HOSPITAL_COMMUNITY)
Admission: RE | Admit: 2015-03-04 | Discharge: 2015-03-04 | Disposition: A | Discharge status: Home / Self Care | Payer: BLUE CROSS/BLUE SHIELD | Source: Ambulatory Visit | Attending: Obstetrics & Gynecology | Admitting: Obstetrics & Gynecology

## 2015-03-04 ENCOUNTER — Encounter (HOSPITAL_COMMUNITY): Payer: Self-pay

## 2015-03-04 DIAGNOSIS — Z1231 Encounter for screening mammogram for malignant neoplasm of breast: Secondary | ICD-10-CM

## 2015-03-04 NOTE — Patient Instructions (Addendum)
   Your procedure is scheduled on: December 14 [facility])  Enter through the Main Entrance of [facility] at: [place] up the phone at the desk and dial [phone] and inform us of your arrival.  Please call this number if you have any problems the morning of surgery: [phone]  Remember: Do not eat food after midnight: December 13 (TUESDAY (TONIGHT)  Do not drink clear liquids after: 8AM DAY OF SURGERY  Take these medicines the morning of surgery with a SIP OF WATER: TAKE NORVASC AND PRILOSEC AS YOUR NORMALLY TAKE THEM.  Do not wear jewelry, make-up, or FINGER nail polish No metal in your hair or on your body. Do not wear lotions, powders, perfumes.  You may wear deodorant.  Do not bring valuables to the hospital. Contacts, dentures or bridgework may not be worn into surgery.  Leave suitcase in the car. After Surgery it may be brought to your room. For patients being admitted to the hospital, checkout time is 11:00am the day of discharge.

## 2015-03-04 NOTE — Telephone Encounter (Signed)
Called and spoke with pt regarding forms. She states Christella Scheuermann is needing medical records faxed to them. She also states that Rosiclare from [facility] has faxed our office a request ( I do not see where it's been received). Pt also stated that Desiree called our office on 01/28/15, 11/17, 11/28 and 11/29 regarding medical records and was given a number to our main medical records dept. I do not see where any phone notes were placed in pt's chart for these phone calls.  I obtained a contact phone number for Desiree to call directly, which is [phone] ext [phone]. I attempted to call her, but received the message that their office is closed today. I called pt back and informed her and I told her I would attempt to call her again tomorrow.

## 2015-03-05 ENCOUNTER — Ambulatory Visit (HOSPITAL_COMMUNITY): Payer: BLUE CROSS/BLUE SHIELD | Admitting: Anesthesiology

## 2015-03-05 ENCOUNTER — Encounter (HOSPITAL_COMMUNITY): Payer: Self-pay | Admitting: Anesthesiology

## 2015-03-05 ENCOUNTER — Observation Stay (HOSPITAL_COMMUNITY)
Admission: RE | Admit: 2015-03-05 | Discharge: 2015-03-06 | Disposition: A | Discharge status: Home / Self Care | Payer: BLUE CROSS/BLUE SHIELD | Source: Ambulatory Visit | Attending: Obstetrics & Gynecology | Admitting: Obstetrics & Gynecology

## 2015-03-05 ENCOUNTER — Encounter (HOSPITAL_COMMUNITY)
Admission: RE | Disposition: A | Discharge status: Home / Self Care | Payer: Self-pay | Source: Ambulatory Visit | Attending: Obstetrics & Gynecology

## 2015-03-05 DIAGNOSIS — Z7982 Long term (current) use of aspirin: Secondary | ICD-10-CM | POA: Insufficient documentation

## 2015-03-05 DIAGNOSIS — R102 Pelvic and perineal pain: Principal | ICD-10-CM | POA: Insufficient documentation

## 2015-03-05 DIAGNOSIS — C946 Myelodysplastic disease, not classified: Secondary | ICD-10-CM | POA: Insufficient documentation

## 2015-03-05 DIAGNOSIS — I10 Essential (primary) hypertension: Secondary | ICD-10-CM | POA: Insufficient documentation

## 2015-03-05 DIAGNOSIS — Z90710 Acquired absence of both cervix and uterus: Secondary | ICD-10-CM | POA: Diagnosis present

## 2015-03-05 DIAGNOSIS — Z87891 Personal history of nicotine dependence: Secondary | ICD-10-CM | POA: Insufficient documentation

## 2015-03-05 HISTORY — PX: LAPAROSCOPIC VAGINAL HYSTERECTOMY WITH SALPINGECTOMY: SHX6680

## 2015-03-05 LAB — PREGNANCY, URINE: Preg Test, Ur: NEGATIVE

## 2015-03-05 SURGERY — HYSTERECTOMY, VAGINAL, LAPAROSCOPY-ASSISTED, WITH SALPINGECTOMY
Anesthesia: General | Laterality: Bilateral

## 2015-03-05 MED ORDER — ONDANSETRON HCL 4 MG PO TABS: 4.0000 mg | ORAL_TABLET | Freq: Four times a day (QID) | ORAL | Status: DC | PRN

## 2015-03-05 MED ORDER — FENTANYL CITRATE (PF) 250 MCG/5ML IJ SOLN
INTRAMUSCULAR | Status: DC | PRN
  Administered 2015-03-05 (×2): 50 ug via INTRAVENOUS
  Administered 2015-03-05: 100 ug via INTRAVENOUS

## 2015-03-05 MED ORDER — HYDROMORPHONE HCL 1 MG/ML IJ SOLN
INTRAMUSCULAR | Status: AC
  Administered 2015-03-05: 0.5 mg via INTRAVENOUS
  Filled 2015-03-05: qty 1

## 2015-03-05 MED ORDER — ONDANSETRON HCL 4 MG/2ML IJ SOLN: 4.0000 mg | Freq: Four times a day (QID) | INTRAMUSCULAR | Status: DC | PRN

## 2015-03-05 MED ORDER — ONDANSETRON HCL 4 MG/2ML IJ SOLN
INTRAMUSCULAR | Status: AC
  Filled 2015-03-05: qty 2

## 2015-03-05 MED ORDER — ROCURONIUM BROMIDE 100 MG/10ML IV SOLN
INTRAVENOUS | Status: AC
  Filled 2015-03-05: qty 1

## 2015-03-05 MED ORDER — OXYCODONE-ACETAMINOPHEN 5-325 MG PO TABS
1.0000 | ORAL_TABLET | ORAL | Status: DC | PRN
  Administered 2015-03-06: 1 via ORAL
  Filled 2015-03-05: qty 1

## 2015-03-05 MED ORDER — PROPOFOL 10 MG/ML IV BOLUS
INTRAVENOUS | Status: AC
Start: 2015-03-05 — End: 2015-03-05
  Filled 2015-03-05: qty 20

## 2015-03-05 MED ORDER — KETOROLAC TROMETHAMINE 30 MG/ML IJ SOLN: 30.0000 mg | Freq: Four times a day (QID) | INTRAMUSCULAR | Status: DC

## 2015-03-05 MED ORDER — HYDROMORPHONE HCL 1 MG/ML IJ SOLN: 0.2000 mg | INTRAMUSCULAR | Status: DC | PRN

## 2015-03-05 MED ORDER — CEFAZOLIN SODIUM-DEXTROSE 2-3 GM-% IV SOLR
2.0000 g | INTRAVENOUS | Status: AC
  Administered 2015-03-05: 2 g via INTRAVENOUS

## 2015-03-05 MED ORDER — FENTANYL CITRATE (PF) 100 MCG/2ML IJ SOLN: 25.0000 ug | INTRAMUSCULAR | Status: DC | PRN

## 2015-03-05 MED ORDER — MEPERIDINE HCL 25 MG/ML IJ SOLN
INTRAMUSCULAR | Status: AC
  Administered 2015-03-05: 12.5 mg
  Filled 2015-03-05: qty 1

## 2015-03-05 MED ORDER — DEXAMETHASONE SODIUM PHOSPHATE 4 MG/ML IJ SOLN
INTRAMUSCULAR | Status: DC | PRN
  Administered 2015-03-05: 4 mg via INTRAVENOUS

## 2015-03-05 MED ORDER — DOCUSATE SODIUM 100 MG PO CAPS
100.0000 mg | ORAL_CAPSULE | Freq: Two times a day (BID) | ORAL | Status: DC
  Administered 2015-03-05 – 2015-03-06 (×2): 100 mg via ORAL
  Filled 2015-03-05 (×2): qty 1

## 2015-03-05 MED ORDER — SIMETHICONE 80 MG PO CHEW: 80.0000 mg | CHEWABLE_TABLET | Freq: Four times a day (QID) | ORAL | Status: DC | PRN

## 2015-03-05 MED ORDER — MIDAZOLAM HCL 2 MG/2ML IJ SOLN
INTRAMUSCULAR | Status: AC
  Filled 2015-03-05: qty 2

## 2015-03-05 MED ORDER — LACTATED RINGERS IV SOLN
INTRAVENOUS | Status: DC
  Administered 2015-03-05 (×2): via INTRAVENOUS

## 2015-03-05 MED ORDER — ROCURONIUM BROMIDE 100 MG/10ML IV SOLN
INTRAVENOUS | Status: DC | PRN
  Administered 2015-03-05: 40 mg via INTRAVENOUS

## 2015-03-05 MED ORDER — FENTANYL CITRATE (PF) 250 MCG/5ML IJ SOLN
INTRAMUSCULAR | Status: AC
  Filled 2015-03-05: qty 5

## 2015-03-05 MED ORDER — HYDROMORPHONE HCL 1 MG/ML IJ SOLN
INTRAMUSCULAR | Status: AC
  Filled 2015-03-05: qty 1

## 2015-03-05 MED ORDER — SCOPOLAMINE 1 MG/3DAYS TD PT72
MEDICATED_PATCH | TRANSDERMAL | Status: AC
  Administered 2015-03-05: 1.5 mg via TRANSDERMAL
  Filled 2015-03-05: qty 1

## 2015-03-05 MED ORDER — MIDAZOLAM HCL 2 MG/2ML IJ SOLN
INTRAMUSCULAR | Status: DC | PRN
  Administered 2015-03-05: 1 mg via INTRAVENOUS
  Administered 2015-03-05: 2 mg via INTRAVENOUS
  Administered 2015-03-05: 1 mg via INTRAVENOUS

## 2015-03-05 MED ORDER — PROPOFOL 10 MG/ML IV BOLUS
INTRAVENOUS | Status: DC | PRN
  Administered 2015-03-05: 150 mg via INTRAVENOUS

## 2015-03-05 MED ORDER — HYDROMORPHONE HCL 1 MG/ML IJ SOLN
INTRAMUSCULAR | Status: AC
  Filled 2015-03-05: qty 2

## 2015-03-05 MED ORDER — METOCLOPRAMIDE HCL 5 MG/ML IJ SOLN: 10.0000 mg | Freq: Once | INTRAMUSCULAR | Status: DC | PRN

## 2015-03-05 MED ORDER — DEXAMETHASONE SODIUM PHOSPHATE 10 MG/ML IJ SOLN
INTRAMUSCULAR | Status: AC
  Filled 2015-03-05: qty 1

## 2015-03-05 MED ORDER — NEOSTIGMINE METHYLSULFATE 10 MG/10ML IV SOLN
INTRAVENOUS | Status: AC
  Filled 2015-03-05: qty 1

## 2015-03-05 MED ORDER — LIDOCAINE HCL (CARDIAC) 20 MG/ML IV SOLN
INTRAVENOUS | Status: AC
  Filled 2015-03-05: qty 5

## 2015-03-05 MED ORDER — ONDANSETRON HCL 4 MG/2ML IJ SOLN
INTRAMUSCULAR | Status: DC | PRN
  Administered 2015-03-05: 4 mg via INTRAVENOUS

## 2015-03-05 MED ORDER — LACTATED RINGERS IV SOLN: INTRAVENOUS | Status: DC

## 2015-03-05 MED ORDER — GLYCOPYRROLATE 0.2 MG/ML IJ SOLN
INTRAMUSCULAR | Status: AC
  Filled 2015-03-05: qty 3

## 2015-03-05 MED ORDER — CEFAZOLIN SODIUM-DEXTROSE 2-3 GM-% IV SOLR
INTRAVENOUS | Status: AC
  Filled 2015-03-05: qty 50

## 2015-03-05 MED ORDER — SCOPOLAMINE 1 MG/3DAYS TD PT72
1.0000 | MEDICATED_PATCH | Freq: Once | TRANSDERMAL | Status: DC
  Administered 2015-03-05: 1.5 mg via TRANSDERMAL

## 2015-03-05 MED ORDER — KETOROLAC TROMETHAMINE 30 MG/ML IJ SOLN
30.0000 mg | Freq: Once | INTRAMUSCULAR | Status: AC
  Administered 2015-03-05: 30 mg via INTRAVENOUS

## 2015-03-05 MED ORDER — NEOSTIGMINE METHYLSULFATE 10 MG/10ML IV SOLN
INTRAVENOUS | Status: DC | PRN
  Administered 2015-03-05: 3 mg via INTRAVENOUS

## 2015-03-05 MED ORDER — HYDROMORPHONE HCL 1 MG/ML IJ SOLN
INTRAMUSCULAR | Status: DC | PRN
  Administered 2015-03-05: 1 mg via INTRAVENOUS

## 2015-03-05 MED ORDER — MENTHOL 3 MG MT LOZG
1.0000 | LOZENGE | OROMUCOSAL | Status: DC | PRN
  Administered 2015-03-05: 3 mg via ORAL
  Filled 2015-03-05: qty 9

## 2015-03-05 MED ORDER — DOXEPIN HCL 25 MG PO CAPS
50.0000 mg | ORAL_CAPSULE | Freq: Every day | ORAL | Status: DC
  Filled 2015-03-05 (×2): qty 2

## 2015-03-05 MED ORDER — BUPIVACAINE HCL (PF) 0.25 % IJ SOLN
INTRAMUSCULAR | Status: AC
  Filled 2015-03-05: qty 30

## 2015-03-05 MED ORDER — MEPERIDINE HCL 25 MG/ML IJ SOLN: 6.2500 mg | INTRAMUSCULAR | Status: DC | PRN

## 2015-03-05 MED ORDER — HYDROMORPHONE HCL 1 MG/ML IJ SOLN
0.2500 mg | INTRAMUSCULAR | Status: DC | PRN
  Administered 2015-03-05 (×3): 0.5 mg via INTRAVENOUS

## 2015-03-05 MED ORDER — KETOROLAC TROMETHAMINE 30 MG/ML IJ SOLN
30.0000 mg | Freq: Four times a day (QID) | INTRAMUSCULAR | Status: DC
  Administered 2015-03-05 – 2015-03-06 (×2): 30 mg via INTRAVENOUS
  Filled 2015-03-05 (×2): qty 1

## 2015-03-05 MED ORDER — LACTATED RINGERS IV SOLN
INTRAVENOUS | Status: DC
  Administered 2015-03-05 – 2015-03-06 (×2): via INTRAVENOUS

## 2015-03-05 MED ORDER — KETOROLAC TROMETHAMINE 30 MG/ML IJ SOLN
INTRAMUSCULAR | Status: AC
  Administered 2015-03-05: 30 mg via INTRAVENOUS
  Filled 2015-03-05: qty 1

## 2015-03-05 MED ORDER — LIDOCAINE HCL (CARDIAC) 20 MG/ML IV SOLN
INTRAVENOUS | Status: DC | PRN
  Administered 2015-03-05: 100 mg via INTRAVENOUS

## 2015-03-05 MED ORDER — ONDANSETRON HCL 4 MG/2ML IJ SOLN: 4.0000 mg | Freq: Once | INTRAMUSCULAR | Status: DC | PRN

## 2015-03-05 MED ORDER — GLYCOPYRROLATE 0.2 MG/ML IJ SOLN
INTRAMUSCULAR | Status: DC | PRN
  Administered 2015-03-05: 0.1 mg via INTRAVENOUS
  Administered 2015-03-05: 0.4 mg via INTRAVENOUS

## 2015-03-05 SURGICAL SUPPLY — 41 items
CABLE HIGH FREQUENCY MONO STRZ (ELECTRODE) IMPLANT
CANISTER SUCT 3000ML (MISCELLANEOUS) ×2 IMPLANT
CATH ROBINSON RED A/P 16FR (CATHETERS) ×2 IMPLANT
CLOTH BEACON ORANGE TIMEOUT ST (SAFETY) ×2 IMPLANT
COVER BACK TABLE 60X90IN (DRAPES) ×2 IMPLANT
DECANTER SPIKE VIAL GLASS SM (MISCELLANEOUS) IMPLANT
DRSG COVADERM PLUS 2X2 (GAUZE/BANDAGES/DRESSINGS) ×2 IMPLANT
DRSG OPSITE POSTOP 3X4 (GAUZE/BANDAGES/DRESSINGS) ×2 IMPLANT
DURAPREP 26ML APPLICATOR (WOUND CARE) ×2 IMPLANT
ELECT LIGASURE LONG (ELECTRODE) ×2 IMPLANT
ELECT REM PT RETURN 9FT ADLT (ELECTROSURGICAL) ×2
ELECTRODE REM PT RTRN 9FT ADLT (ELECTROSURGICAL) ×1 IMPLANT
FILTER SMOKE EVAC LAPAROSHD (FILTER) ×2 IMPLANT
FORCEPS CUTTING 45CM 5MM (CUTTING FORCEPS) IMPLANT
GLOVE BIO SURGEON STRL SZ 6 (GLOVE) ×4 IMPLANT
GLOVE BIOGEL PI IND STRL 6 (GLOVE) ×2 IMPLANT
GLOVE BIOGEL PI IND STRL 7.0 (GLOVE) ×3 IMPLANT
GLOVE BIOGEL PI INDICATOR 6 (GLOVE) ×2
GLOVE BIOGEL PI INDICATOR 7.0 (GLOVE) ×3
LEGGING LITHOTOMY PAIR STRL (DRAPES) ×2 IMPLANT
LIQUID BAND (GAUZE/BANDAGES/DRESSINGS) ×2 IMPLANT
MANIPULATOR UTERINE 4.5 ZUMI (MISCELLANEOUS) IMPLANT
NEEDLE INSUFFLATION 120MM (ENDOMECHANICALS) ×2 IMPLANT
NS IRRIG 1000ML POUR BTL (IV SOLUTION) ×2 IMPLANT
PACK LAVH (CUSTOM PROCEDURE TRAY) ×2 IMPLANT
PACK ROBOTIC GOWN (GOWN DISPOSABLE) ×2 IMPLANT
PAD POSITIONING PINK XL (MISCELLANEOUS) ×2 IMPLANT
SEALER TISSUE G2 CVD JAW 45CM (ENDOMECHANICALS) IMPLANT
SET IRRIG TUBING LAPAROSCOPIC (IRRIGATION / IRRIGATOR) IMPLANT
SLEEVE XCEL OPT CAN 5 100 (ENDOMECHANICALS) ×2 IMPLANT
SUT MNCRL 0 MO-4 VIOLET 18 CR (SUTURE) ×2 IMPLANT
SUT MNCRL AB 3-0 PS2 27 (SUTURE) ×2 IMPLANT
SUT MON AB 2-0 CT1 36 (SUTURE) ×2 IMPLANT
SUT MONOCRYL 0 MO 4 18  CR/8 (SUTURE) ×2
SUT VICRYL 0 TIES 12 18 (SUTURE) ×2 IMPLANT
SUT VICRYL 0 UR6 27IN ABS (SUTURE) ×2 IMPLANT
TOWEL OR 17X24 6PK STRL BLUE (TOWEL DISPOSABLE) ×4 IMPLANT
TRAY FOLEY CATH SILVER 14FR (SET/KITS/TRAYS/PACK) ×2 IMPLANT
TROCAR XCEL NON-BLD 11X100MML (ENDOMECHANICALS) ×2 IMPLANT
TROCAR XCEL NON-BLD 5MMX100MML (ENDOMECHANICALS) ×2 IMPLANT
WARMER LAPAROSCOPE (MISCELLANEOUS) ×2 IMPLANT

## 2015-03-05 NOTE — Telephone Encounter (Signed)
Called and spoke with Desiree at [facility] (ph. [phone] ext [phone]). She stated they needed medical records from Oct. 20, 2016 to present. Records from our office printed and faxed successfully to her at [phone]. Called and informed patient. JG//CMA

## 2015-03-05 NOTE — Anesthesia Preprocedure Evaluation (Signed)
Anesthesia Evaluation  Patient identified by MRN, date of birth, ID band Patient awake    Reviewed: Allergy & Precautions, NPO status , Patient's Chart, lab work & pertinent test results  History of Anesthesia Complications Negative for: history of anesthetic complications  Airway Mallampati: II  TM Distance: >3 FB Neck ROM: Full    Dental no notable dental hx. (+) Dental Advisory Given   Pulmonary former smoker,    Pulmonary exam normal breath sounds clear to auscultation       Cardiovascular hypertension, Normal cardiovascular exam Rhythm:Regular Rate:Normal  Has had a cardiac workup including a cath which was normal, no cardiac symptoms since   Neuro/Psych negative neurological ROS  negative psych ROS   GI/Hepatic negative GI ROS, Neg liver ROS,   Endo/Other  negative endocrine ROSobesity  Renal/GU negative Renal ROS  negative genitourinary   Musculoskeletal  (+) Arthritis ,   Abdominal   Peds negative pediatric ROS (+)  Hematology negative hematology ROS (+)   Anesthesia Other Findings   Reproductive/Obstetrics negative OB ROS                             Anesthesia Physical Anesthesia Plan  ASA: II  Anesthesia Plan: General   Post-op Pain Management:    Induction: Intravenous  Airway Management Planned: Oral ETT  Additional Equipment:   Intra-op Plan:   Post-operative Plan: Extubation in OR  Informed Consent: I have reviewed the patients History and Physical, chart, labs and discussed the procedure including the risks, benefits and alternatives for the proposed anesthesia with the patient or authorized representative who has indicated his/her understanding and acceptance.   Dental advisory given  Plan Discussed with: CRNA  Anesthesia Plan Comments:         Anesthesia Quick Evaluation

## 2015-03-05 NOTE — Anesthesia Postprocedure Evaluation (Signed)
Anesthesia Post Note  Patient: Brianna Fletcher  Procedure(s) Performed: Procedure(s) (LRB): LAPAROSCOPIC ASSISTED VAGINAL HYSTERECTOMY WITH SALPINGECTOMY (Bilateral)  Patient location during evaluation: [facility] Anesthesia Type: General Level of consciousness: awake and alert Pain management: satisfactory to patient Vital Signs Assessment: post-procedure vital signs reviewed and stable Respiratory status: spontaneous breathing and respiratory function stable Cardiovascular status: stable Postop Assessment: adequate PO intake Anesthetic complications: no    Last Vitals:  Filed Vitals:   03/05/15 1610 03/05/15 1707  BP: 120/69 114/53  Pulse:  61  Temp: 36.4 C 36.4 C  Resp: 16 16    Last Pain:  Filed Vitals:   03/05/15 1731  PainSc: 2                  Deshara Rossi

## 2015-03-05 NOTE — Progress Notes (Signed)
No change to H&P.  Gladies Sofranko, DO 

## 2015-03-05 NOTE — Transfer of Care (Signed)
Immediate Anesthesia Transfer of Care Note  Patient: Brianna Fletcher  Procedure(s) Performed: Procedure(s): LAPAROSCOPIC ASSISTED VAGINAL HYSTERECTOMY WITH SALPINGECTOMY (Bilateral)  Patient Location: PACU  Anesthesia Type:General  Level of Consciousness: awake, alert  and oriented  Airway & Oxygen Therapy: Patient Spontanous Breathing and Patient connected to nasal cannula oxygen  Post-op Assessment: Report given to RN and Post -op Vital signs reviewed and stable  Post vital signs: Reviewed and stable  Last Vitals:  Filed Vitals:   03/05/15 1102  BP: 130/76  Pulse: 63  Temp: 36.4 C  Resp: 20    Complications: No apparent anesthesia complications

## 2015-03-05 NOTE — Op Note (Signed)
PROCEDURE DATE: 03/05/2015 PREOPERATIVE DIAGNOSIS: Post-ablation syndrome POSTOPERATIVE DIAGNOSIS: The same  PROCEDURE: Laparoscopic Assisted Vaginal Hysterectomy, Bilateral Salpingectomy SURGEON: Dr. Linda Hedges  ASSISTANT: Dr. Arvella Nigh INDICATIONS: 44 y.o. With severe pelvic pain following endometrial ablation 6 months ago desiring definitive surgical management. Risks of surgery were discussed with the patient including but not limited to: bleeding which may require transfusion or reoperation; infection which may require antibiotics; injury to bowel, bladder, ureters or other surrounding organs; need for additional procedures including laparotomy; thromboembolic phenomenon, incisional problems and other postoperative/anesthesia complications. Written informed consent was obtained.  FINDINGS: Small uterus, normal adnexa bilaterally. No evidence of endometriosis. Normal upper abdomen. ANESTHESIA: General  ESTIMATED BLOOD LOSS: 150 ml  SPECIMENS: Uterus, cervix, and fallopian tubes COMPLICATIONS: None immediate  PROCEDURE IN DETAIL: The patient received intravenous antibiotics and had sequential compression devices applied to her lower extremities while in the preoperative area. She was then taken to the operating room where general anesthesia was administered and was found to be adequate. She was placed in the dorsal lithotomy position, and was prepped and draped in a sterile manner. An in and out catheterization was performed. A uterine manipulator was then advanced into the uterus . After an adequate timeout was performed, attention was then turned to the patient's abdomen where a 10-mm skin incision was made in the umbilical fold. The Veress needle was carefully introduced into the peritoneal cavity through the abdominal wall. Intraperitoneal placement was confirmed by drop in intraabdominal pressure with insufflation of carbon dioxide gas. Adequate pneumoperitoneum was obtained, and the 10/11 XL  trocar and sleeve were then advanced without difficulty into the abdomen where intraabdominal placement was confirmed by the laparoscope. A survey of the patient's pelvis and abdomen revealed entirely normal anatomy. Suprapubic 5 XL port was then placed under direct visualization. The pelvis was then carefully examined. On the right side, the fallopian tube was elevated and using the Gyrus was freed from the mesosalpinx and attachment to the ovary.  Tthe round ligament was then clamped, cauterized and transected with the Gyrus. The uteroovarian ligament was also clamped and transected. The leaves of the broad ligament were separated and serially transected. These procedures were then repeated on the left side. The ureters were noted to be safely away from the area of dissection.  At this point, attention was turned to the vaginal portion of the case. A weighted speculum was placed posteriorly, a Deaver anteriorly, and the cervix grasped with a thyroid tenaculum. Once the anterior and posterior reflections were identified, the cervix was circumscribed using the Bovie knife. Next, using Mayos, the posterior cul-de-sac was entered. The LigaSure was then used to grasp the uterosacrals which were coapted and cut. Next, the bladder reflection was identified. Using Metzenbaums, it was entered and palpation and direct visualization confirmed proper location. Next, using the LigaSure, the uterine arteries were coapted and cut bilaterally. The pedicles were visualized after coaptation and were hemostatic. The same was performed sequentially cephalad until the uterus, cervix, and fallopian tubes were removed. The pedicles were inspected and found to be hemostatic.  Next, the uterosacrals were tagged with monocryl bilaterally. The posterior peritoneum was closed using monocryl in a purse-string fashion. The uterosacrals were brought together in the midline cuff closure with a figure-of-8 stitch using monocryl followed by the  remainder of the cuff closure in the same fashion. The cuff was inspected and found to be hemostatic.  Attention was returned to the abdomen were a second laparoscopic look was taken. All  pedicles were hemostatic. Insufflation was removed after all instruments were removed.  The infraumbilical fascial incision was closed with 0 Vicryl in a figure of eight stitch.  All skin incisions were closed with 4-0 Vicryl subcuticular stitches and Dermabond. The patient tolerated the procedures well. All instruments, needles, and sponge counts were correct x 2. The patient was taken to the recovery room awake, extubated and in stable condition.

## 2015-03-05 NOTE — Progress Notes (Signed)
-  NOS- No current c/o.  Pain well controlled.  No N/V.  No CP/SOB.  Tolerating clears.  VSS. AF. Gen: A&O x 3 Abd: soft, ND, dressings c/d x 2 Ext: no c/c/e  POD#0 s/p LAVH -Continue pain and nausea control -AM labs pending -Ambulate -Advance diet -D/C IVF and Foley in AM.  Linda Hedges, DO

## 2015-03-05 NOTE — Anesthesia Procedure Notes (Signed)
Procedure Name: Intubation Date/Time: 03/05/2015 12:39 PM Performed by: Flossie Dibble Pre-anesthesia Checklist: Patient being monitored, Suction available, Emergency Drugs available, Patient identified and Timeout performed Patient Re-evaluated:Patient Re-evaluated prior to inductionOxygen Delivery Method: Circle system utilized Preoxygenation: Pre-oxygenation with 100% oxygen Intubation Type: IV induction Ventilation: Mask ventilation without difficulty Laryngoscope Size: Mac and 3 Grade View: Grade I Tube type: Oral Tube size: 7.0 mm Number of attempts: 1 Airway Equipment and Method: Patient positioned with wedge pillow and Stylet Secured at: 21 cm Tube secured with: Tape Dental Injury: Teeth and Oropharynx as per pre-operative assessment

## 2015-03-05 NOTE — Anesthesia Postprocedure Evaluation (Signed)
Anesthesia Post Note  Patient: Brianna Fletcher  Procedure(s) Performed: Procedure(s) (LRB): LAPAROSCOPIC ASSISTED VAGINAL HYSTERECTOMY WITH SALPINGECTOMY (Bilateral)  Patient location during evaluation: PACU Anesthesia Type: General Level of consciousness: awake and alert Pain management: pain level controlled Vital Signs Assessment: post-procedure vital signs reviewed and stable Respiratory status: spontaneous breathing, nonlabored ventilation, respiratory function stable and patient connected to nasal cannula oxygen Cardiovascular status: blood pressure returned to baseline and stable Postop Assessment: no signs of nausea or vomiting Anesthetic complications: no    Last Vitals:  Filed Vitals:   03/05/15 1445 03/05/15 1500  BP: 122/68 125/78  Pulse: 60 62  Temp:    Resp: 12 12    Last Pain: There were no vitals filed for this visit.               Aric Jost A.

## 2015-03-05 NOTE — Addendum Note (Signed)
Addendum  created 03/05/15 1824 by Flossie Dibble, CRNA   Modules edited: Notes Section   Notes Section:  File: [id number]

## 2015-03-06 ENCOUNTER — Encounter (HOSPITAL_COMMUNITY): Payer: Self-pay | Admitting: Obstetrics & Gynecology

## 2015-03-06 DIAGNOSIS — R102 Pelvic and perineal pain: Secondary | ICD-10-CM | POA: Diagnosis not present

## 2015-03-06 LAB — COMPREHENSIVE METABOLIC PANEL
ALBUMIN: 3.5 g/dL (ref 3.5–5.0)
ALK PHOS: 93 U/L (ref 38–126)
ALT: 12 U/L — AB (ref 14–54)
ANION GAP: 7 (ref 5–15)
AST: 14 U/L — ABNORMAL LOW (ref 15–41)
BILIRUBIN TOTAL: 0.5 mg/dL (ref 0.3–1.2)
BUN: 10 mg/dL (ref 6–20)
CALCIUM: 9.9 mg/dL (ref 8.9–10.3)
CO2: 26 mmol/L (ref 22–32)
CREATININE: 0.63 mg/dL (ref 0.44–1.00)
Chloride: 104 mmol/L (ref 101–111)
GFR calc non Af Amer: >60 mL/min (ref >60)
GLUCOSE: 114 mg/dL — AB (ref 65–99)
Potassium: 4.1 mmol/L (ref 3.5–5.1)
Sodium: 137 mmol/L (ref 135–145)
TOTAL PROTEIN: 6.4 g/dL — AB (ref 6.5–8.1)

## 2015-03-06 LAB — CBC
HEMATOCRIT: 34.4 % — AB (ref 36.0–46.0)
HEMOGLOBIN: 11.1 g/dL — AB (ref 12.0–15.0)
MCH: 30.6 pg (ref 26.0–34.0)
MCHC: 32.3 g/dL (ref 30.0–36.0)
MCV: 94.8 fL (ref 78.0–100.0)
Platelets: 203 10*3/uL (ref 150–400)
RBC: 3.63 MIL/uL — ABNORMAL LOW (ref 3.87–5.11)
RDW: 13.4 % (ref 11.5–15.5)
WBC: 14.2 10*3/uL — ABNORMAL HIGH (ref 4.0–10.5)

## 2015-03-06 MED ORDER — IBUPROFEN 800 MG PO TABS: 800.0000 mg | ORAL_TABLET | Freq: Four times a day (QID) | ORAL | Status: DC | PRN

## 2015-03-06 MED ORDER — OXYCODONE-ACETAMINOPHEN 5-325 MG PO TABS: 1.0000 | ORAL_TABLET | ORAL | Status: DC | PRN

## 2015-03-06 NOTE — Discharge Instructions (Signed)
Call MD for T>100.4, heavy vaginal bleeding, severe abdominal pain, intractable nausea and/or vomiting, or respiratory distress.  Call office to schedule postop appointment in 2 weeks.  No driving while taking narcotics.  Pelvic rest x 6 weeks. °

## 2015-03-06 NOTE — Progress Notes (Signed)
Pt  Ambulated out teaching complete  

## 2015-03-06 NOTE — Progress Notes (Signed)
No current c/o.  Ambulating well. Tolerating po.  Voiding without difficulty.  Pain well controlled.  No N/V.  Passing flatus.  VSS. AF. Labs reviewed and wnl.  Gen: A&O x 3 Abd: soft, inc c/d x 2 Ext: no c/c/e  POD#1 s/p LAVH -Meeting all goals -D/C home with f/u in office  Linda Hedges, DO

## 2015-03-06 NOTE — Discharge Summary (Signed)
Physician Discharge Summary  Patient ID: Brianna Fletcher MRN: [id number] DOB/AGE: [birth year] 43 y.o.  Admit date: 03/05/2015 Discharge date: 03/06/2015  Admission Diagnoses:  Post-ablation syndrome  Discharge Diagnoses: SAA Active Problems:   S/P hysterectomy   Discharged Condition: good  Hospital Course: Patient was admitted with plans for LAVH to definitively treat post-ablation syndrome.  This procedure was performed without complication.  On POD#1, the patient was meeting all goals and was discharged home with plans for f/u in office in 2 weeks.  Consults: None  Significant Diagnostic Studies: n/a  Treatments: surgery: LAVH, b/l salpingectomy  Discharge Exam: Blood pressure 110/53, pulse 44, temperature 98.1 F (36.7 C), temperature source Oral, resp. rate 18, height 5\' 7"  (1.702 m), weight 103.42 kg (228 lb), SpO2 96 %. General appearance: alert, cooperative and appears stated age GI: normal findings: appropriately ttp Extremities: extremities normal, atraumatic, no cyanosis or edema Incision/Wound:inc c/d x 2  Disposition: 01-Home or Self Care     Medication List    TAKE these medications        amLODipine 5 MG tablet  Commonly known as:  NORVASC  Take 1 tablet (5 mg total) by mouth daily.     aspirin 81 MG tablet  Take 81 mg by mouth daily.     doxepin 50 MG capsule  Commonly known as:  SINEQUAN  TAKE 1 CAPSULE BY MOUTH AT BEDTIME     ibuprofen 800 MG tablet  Commonly known as:  ADVIL  Take 1 tablet (800 mg total) by mouth every 6 (six) hours as needed.     mirabegron ER 50 MG Tb24 tablet  Commonly known as:  MYRBETRIQ  Take 1 tablet (50 mg total) by mouth daily.     omeprazole 20 MG capsule  Commonly known as:  PRILOSEC  TAKE 1 CAPSULE (20 MG TOTAL) BY MOUTH DAILY.     oxyCODONE-acetaminophen 5-325 MG tablet  Commonly known as:  PERCOCET/ROXICET  Take 1-2 tablets by mouth every 4 (four) hours as needed (moderate to severe pain (when tolerating  fluids)).     PEGASYS 180 MCG/ML injection  Generic drug:  peginterferon alfa-2a  Inject 0.45 mLs (81 mcg total) into the skin every 7 (seven) days.         SignedLinda Hedges 03/06/2015, 8:34 AM

## 2015-03-13 ENCOUNTER — Other Ambulatory Visit: Payer: BLUE CROSS/BLUE SHIELD

## 2015-03-14 ENCOUNTER — Other Ambulatory Visit: Payer: BLUE CROSS/BLUE SHIELD

## 2015-03-20 ENCOUNTER — Other Ambulatory Visit: Payer: Self-pay | Admitting: Family Medicine

## 2015-03-27 ENCOUNTER — Telehealth: Payer: Self-pay | Admitting: Family Medicine

## 2015-03-27 ENCOUNTER — Encounter: Payer: BLUE CROSS/BLUE SHIELD | Admitting: Medical

## 2015-03-27 NOTE — Telephone Encounter (Signed)
Caller name: Raegan   Relationship to patient: Self   Can be reached: [phone]  Reason for call: pt called in because he states she had death in the family. She need to cancel her appt.

## 2015-03-27 NOTE — Progress Notes (Signed)
This encounter was created in error - please disregard.

## 2015-04-04 ENCOUNTER — Encounter: Payer: Self-pay | Admitting: Hematology & Oncology

## 2015-04-04 ENCOUNTER — Ambulatory Visit (HOSPITAL_BASED_OUTPATIENT_CLINIC_OR_DEPARTMENT_OTHER): Payer: BLUE CROSS/BLUE SHIELD | Admitting: Hematology & Oncology

## 2015-04-04 ENCOUNTER — Other Ambulatory Visit (HOSPITAL_BASED_OUTPATIENT_CLINIC_OR_DEPARTMENT_OTHER): Payer: BLUE CROSS/BLUE SHIELD

## 2015-04-04 ENCOUNTER — Other Ambulatory Visit: Payer: BLUE CROSS/BLUE SHIELD

## 2015-04-04 ENCOUNTER — Ambulatory Visit: Payer: BLUE CROSS/BLUE SHIELD | Admitting: Hematology & Oncology

## 2015-04-04 VITALS — BP 120/66 | HR 67 | Temp 97.7°F | Resp 18 | Ht 67.0 in | Wt 228.0 lb

## 2015-04-04 DIAGNOSIS — D471 Chronic myeloproliferative disease: Secondary | ICD-10-CM | POA: Diagnosis not present

## 2015-04-04 DIAGNOSIS — D72828 Other elevated white blood cell count: Secondary | ICD-10-CM

## 2015-04-04 LAB — CBC WITH DIFFERENTIAL (CANCER CENTER ONLY)
BASO#: 0 10*3/uL (ref 0.0–0.2)
BASO%: 0.1 % (ref 0.0–2.0)
EOS%: 1 % (ref 0.0–7.0)
Eosinophils Absolute: 0.2 10*3/uL (ref 0.0–0.5)
HCT: 43 % (ref 34.8–46.6)
HEMOGLOBIN: 14 g/dL (ref 11.6–15.9)
LYMPH#: 2.3 10*3/uL (ref 0.9–3.3)
LYMPH%: 12.2 % — AB (ref 14.0–48.0)
MCH: 30.8 pg (ref 26.0–34.0)
MCHC: 32.6 g/dL (ref 32.0–36.0)
MCV: 95 fL (ref 81–101)
MONO#: 1 10*3/uL — ABNORMAL HIGH (ref 0.1–0.9)
MONO%: 5.4 % (ref 0.0–13.0)
NEUT%: 81.3 % — ABNORMAL HIGH (ref 39.6–80.0)
NEUTROS ABS: 15 10*3/uL — AB (ref 1.5–6.5)
PLATELETS: 297 10*3/uL (ref 145–400)
RBC: 4.55 10*6/uL (ref 3.70–5.32)
RDW: 13.4 % (ref 11.1–15.7)
WBC: 18.5 10*3/uL — ABNORMAL HIGH (ref 3.9–10.0)

## 2015-04-04 LAB — COMPREHENSIVE METABOLIC PANEL
ALBUMIN: 4.3 g/dL (ref 3.5–5.0)
ALK PHOS: 129 U/L (ref 40–150)
ALT: 9 U/L (ref 0–55)
AST: 13 U/L (ref 5–34)
Anion Gap: 9 mEq/L (ref 3–11)
BUN: 11.9 mg/dL (ref 7.0–26.0)
CO2: 24 meq/L (ref 22–29)
Calcium: 10.6 mg/dL — ABNORMAL HIGH (ref 8.4–10.4)
Chloride: 105 mEq/L (ref 98–109)
Creatinine: 0.8 mg/dL (ref 0.6–1.1)
EGFR: 85 mL/min/{1.73_m2} — AB (ref >90)
GLUCOSE: 88 mg/dL (ref 70–140)
POTASSIUM: 4.3 meq/L (ref 3.5–5.1)
SODIUM: 139 meq/L (ref 136–145)
Total Bilirubin: 0.55 mg/dL (ref 0.20–1.20)
Total Protein: 8.3 g/dL (ref 6.4–8.3)

## 2015-04-04 LAB — CHCC SATELLITE - SMEAR

## 2015-04-04 LAB — LACTATE DEHYDROGENASE: LDH: 179 U/L (ref 125–245)

## 2015-04-04 LAB — TECHNOLOGIST REVIEW CHCC SATELLITE

## 2015-04-04 NOTE — Progress Notes (Signed)
Hematology and Oncology Follow Up Visit  JENAVECIA FEIERTAG [id number] 1971/02/23 45 y.o. 04/04/2015   Principle Diagnosis:  Chronic myeloproliferative syndrome-Triple negative  Current Therapy:   Peg-Interferon q weekly - q 3 week dosing.     Interim History:  Ms.  Dargin is back for followup. She, unfortunately, is having a lot of issues with her company. [employer] is closing up in February. She was back to work but only would go back to work for a couple weeks. Somehow, this would affect her severance package.  She did have her hysterectomy. This went without complications. Nothing unusual was found. I foresee, it did not help her pelvic pain. She can use have episodes of pelvic pain.  [name] had no fever. [name] had no bleeding.  His been no change in bowel or bladder habits.   Overall, her performance status is ECOG 1.   Medications:  Current outpatient prescriptions:  .  amLODipine (NORVASC) 5 MG tablet, Take 1 tablet (5 mg total) by mouth daily., Disp: 90 tablet, Rfl: 3 .  aspirin 81 MG tablet, Take 81 mg by mouth daily., Disp: , Rfl:  .  doxepin (SINEQUAN) 50 MG capsule, TAKE 1 CAPSULE BY MOUTH AT BEDTIME, Disp: 30 capsule, Rfl: 3 .  ibuprofen (ADVIL,MOTRIN) 800 MG tablet, Take 1 tablet (800 mg total) by mouth every 6 (six) hours as needed., Disp: 30 tablet, Rfl: 1 .  MYRBETRIQ 50 MG TB24 tablet, TAKE 1 TABLET BY MOUTH ONCE DAILY, Disp: 30 tablet, Rfl: 5 .  omeprazole (PRILOSEC) 20 MG capsule, TAKE 1 CAPSULE (20 MG TOTAL) BY MOUTH DAILY., Disp: 30 capsule, Rfl: 11 .  oxyCODONE-acetaminophen (PERCOCET/ROXICET) 5-325 MG tablet, Take 1-2 tablets by mouth every 4 (four) hours as needed (moderate to severe pain (when tolerating fluids))., Disp: 30 tablet, Rfl: 0 .  PEGASYS 180 MCG/ML injection, Inject 0.45 mLs (81 mcg total) into the skin every 7 (seven) days. (Patient taking differently: Inject 180 mcg into the skin every 21 ( twenty-one) days. ), Disp: 4 mL, Rfl: 3 No current  facility-administered medications for this visit.  Facility-Administered Medications Ordered in Other Visits:  .  acetaminophen (TYLENOL) tablet 650 mg, 650 mg, Oral, Once, [name], MD, 650 mg at 04/26/14 1516 .  peginterferon alfa-2a (PEGASYS) injection 81 mcg, 81 mcg, Subcutaneous, Weekly, [name], MD, 81 mcg at 04/26/14 1530  Allergies:  Allergies  Allergen Reactions  . Lisinopril Cough  . Losartan Cough    Past Medical History, Surgical history, Social history, and Family History were reviewed and updated.  Review of Systems: As above  Physical Exam:  height is 5\' 7"  (1.702 m) and weight is 228 lb (103.42 kg). Her oral temperature is 97.7 F (36.5 C). Her blood pressure is 120/66 and her pulse is 67. Her respiration is 18.   Well-developed and well-nourished white female. Head and neck exam shows no ocular or oral lesions. She has no palpable cervical or supraclavicular lymph nodes. Lungs are clear. Cardiac exam regular rate and rhythm with no murmurs rubs or bruits. Abdomen is soft. She has good bowel sounds. She has healing Brosky be scars. There is no fluid wave. There is no palpable liver or spleen tip. Back exam shows no tenderness over the spine or ribs. There is some tenderness over the right hip area. This is chronic. She has some slight decreased range of motion of the right hip. Extremities shows no clubbing, cyanosis or edema. Skin exam no rashes, ecchymosis or petechia. Her  skin is a little dry. Neurological exam shows no focal neurological deficits.  Lab Results  Component Value Date   WBC 18.5* 04/04/2015   HGB 14.0 04/04/2015   HCT 43.0 04/04/2015   MCV 95 04/04/2015   PLT 297 04/04/2015     Chemistry      Component Value Date/Time   NA 139 04/04/2015 0858   NA 137 03/06/2015 0518   NA 145 10/04/2014 1357   K 4.3 04/04/2015 0858   K 4.1 03/06/2015 0518   K 4.1 10/04/2014 1357   CL 104 03/06/2015 0518   CL 107 10/04/2014 1357   CO2 24  04/04/2015 0858   CO2 26 03/06/2015 0518   CO2 26 10/04/2014 1357   BUN 11.9 04/04/2015 0858   BUN 10 03/06/2015 0518   BUN 11 10/04/2014 1357   CREATININE 0.8 04/04/2015 0858   CREATININE 0.63 03/06/2015 0518   CREATININE 0.69 01/08/2015 1716      Component Value Date/Time   CALCIUM 10.6* 04/04/2015 0858   CALCIUM 9.9 03/06/2015 0518   CALCIUM 10.3 10/04/2014 1357   ALKPHOS 129 04/04/2015 0858   ALKPHOS 93 03/06/2015 0518   ALKPHOS 146* 10/04/2014 1357   AST 13 04/04/2015 0858   AST 14* 03/06/2015 0518   AST 25 10/04/2014 1357   ALT 9 04/04/2015 0858   ALT 12* 03/06/2015 0518   ALT 19 10/04/2014 1357   BILITOT 0.55 04/04/2015 0858   BILITOT 0.5 03/06/2015 0518   BILITOT 0.90 10/04/2014 1357         Impression and Plan: Ms. Cutrona is 45 year old female. She has a myeloproliferative syndrome. So far, our workup has been totally negative.  I thought that possibly, the hysterectomy would help with the leukocytosis. It apparently has not.  I think we should get her back on to the interferon. I think that every other week interferon would be reasonable for right now.  I'll like to see her back in 6 weeks. If her white cell count is stable in 6 weeks, then I think we can probably go to every 3 week interferon.  I spent about 30 minutes with her today.   [name], MD 1/13/201712:43 PM

## 2015-04-24 ENCOUNTER — Ambulatory Visit: Payer: BLUE CROSS/BLUE SHIELD | Admitting: Family Medicine

## 2015-04-30 ENCOUNTER — Encounter: Payer: Self-pay | Admitting: Physical Therapy

## 2015-04-30 ENCOUNTER — Ambulatory Visit: Payer: BLUE CROSS/BLUE SHIELD | Attending: Urology | Admitting: Physical Therapy

## 2015-04-30 DIAGNOSIS — R102 Pelvic and perineal pain: Secondary | ICD-10-CM

## 2015-04-30 DIAGNOSIS — N949 Unspecified condition associated with female genital organs and menstrual cycle: Secondary | ICD-10-CM | POA: Insufficient documentation

## 2015-04-30 NOTE — Patient Instructions (Signed)
Toileting Techniques for Bowel Movements (Defecation) Using your belly (abdomen) and pelvic floor muscles to have a bowel movement is usually instinctive.  Sometimes people can have problems with these muscles and have to relearn proper defecation (emptying) techniques.  If you have weakness in your muscles, organs that are falling out, decreased sensation in your pelvis, or ignore your urge to go, you may find yourself straining to have a bowel movement.  You are straining if you are: . holding your breath or taking in a huge gulp of air and holding it  . keeping your lips and jaw tensed and closed tightly . turning red in the face because of excessive pushing or forcing . developing or worsening your  hemorrhoids . getting faint while pushing . not emptying completely and have to defecate many times a day  If you are straining, you are actually making it harder for yourself to have a bowel movement.  Many people find they are pulling up with the pelvic floor muscles and closing off instead of opening the anus. Due to lack pelvic floor relaxation and coordination the abdominal muscles, one has to work harder to push the feces out.  Many people have never been taught how to defecate efficiently and effectively.  Notice what happens to your body when you are having a bowel movement.  While you are sitting on the toilet pay attention to the following areas: . Jaw and mouth position . Angle of your hips   . Whether your feet touch the ground or not . Arm placement  . Spine position . Waist . Belly tension . Anus (opening of the anal canal)  An Evacuation/Defecation Plan   Here are the 4 basic points:  1. Lean forward enough for your elbows to rest on your knees 2. Support your feet on the floor or use a low stool if your feet don't touch the floor  3. Push out your belly as if you have swallowed a beach ball-you should feel a widening of your waist 4. Open and relax your pelvic floor muscles,  rather than tightening around the anus      The following conditions my require modifications to your toileting posture:  . If you have had surgery in the past that limits your back, hip, pelvic, knee or ankle flexibility . Constipation   Your healthcare practitioner may make the following additional suggestions and adjustments:  1) Sit on the toilet  a) Make sure your feet are supported. b) Notice your hip angle and spine position-most people find it effective to lean forward or raise their knees, which can help the muscles around the anus to relax  c) When you lean forward, place your forearms on your thighs for support  2) Relax suggestions a) Breath deeply in through your nose and out slowly through your mouth as if you are smelling the flowers and blowing out the candles. b) To become aware of how to relax your muscles, contracting and releasing muscles can be helpful.  Pull your pelvic floor muscles in tightly by using the image of holding back gas, or closing around the anus (visualize making a circle smaller) and lifting the anus up and in.  Then release the muscles and your anus should drop down and feel open. Repeat 5 times ending with the feeling of relaxation. c) Keep your pelvic floor muscles relaxed; let your belly bulge out. d) The digestive tract starts at the mouth and ends at the anal opening, so be   sure to relax both ends of the tube.  Place your tongue on the roof of your mouth with your teeth separated.  This helps relax your mouth and will help to relax the anus at the same time.  3) Empty (defecation) a) Keep your pelvic floor and sphincter relaxed, then bulge your anal muscles.  Make the anal opening wide.  b) Stick your belly out as if you have swallowed a beach ball. c) Make your belly wall hard using your belly muscles while continuing to breathe. Doing this makes it easier to open your anus. d) Breath out and give a grunt (or try using other sounds such as  ahhhh, shhhhh, ohhhh or grrrrrrr).  4) Finish a) As you finish your bowel movement, pull the pelvic floor muscles up and in.  This will leave your anus in the proper place rather than remaining pushed out and down. If you leave your anus pushed out and down, it will start to feel as though that is normal and give you incorrect signals about needing to have a bowel movement.    Earlie Counts, PT [facility] [address] [place], [place] [address]  Quick Contraction: Gravity Resisted (Sitting)    Sitting, quickly squeeze then fully relax pelvic floor. Perform __1_ sets of _5__. Rest for _1__ seconds between sets. Do __3_ times a day.  Copyright  VHI. All rights reserved.    Slow Contraction: Gravity Resisted (Sitting)    Sitting, slowly squeeze pelvic floor for _10__ seconds. Rest for _5__ seconds. Repeat _5__ times. Do __3_ times a day.  Copyright  VHI. All rights reserved.  Sitting    Sit comfortably. Allow body's muscles to relax. Place hands on belly. Inhale slowly and deeply for _3__ seconds, so hands move out. Then take _3__ seconds to exhale. Repeat __5_ times. Do _1__ times a day. Do when you have pain Copyright  VHI. All rights reserved.  [place] [address], [place], [place] [address] Phone # [phone] Fax [phone]

## 2015-04-30 NOTE — Therapy (Addendum)
[facility] [address] [place], [place], [address] Phone: [phone]   Fax:  [phone]  Physical Therapy Evaluation  Patient Details  Name: Brianna Fletcher MRN: [id number] Date of Birth: 07-01-1970 Referring Provider: Dr. Carolan Clines  Encounter Date: 04/30/2015      PT End of Session - 04/30/15 1223    Visit Number 1   Date for PT Re-Evaluation 06/25/15   PT Start Time 1145   PT Stop Time 1225   PT Time Calculation (min) 40 min   Activity Tolerance Patient tolerated treatment well   Behavior During Therapy [facility] for tasks assessed/performed      Past Medical History  Diagnosis Date  . Glaucoma   . Arthritis   . Leukocytosis, unspecified 11/15/2012  . Chronic neutrophilia 11/15/2012  . Pruritus 11/15/2012  . Monocytosis 11/15/2012  . Myeloproliferative disorder ([place]) 03/08/2013  . Precordial chest pain   . Family history of heart disease     mother had stents placed in her 98s and had myocardial infarctions  . Radial artery occlusion, right (HCC)     status post  right radial artery cardiac catheterization  . Blood dyscrasia     produces too many WBCs  . Vaginal delivery 1986, 2000  . Hypertension     Past Surgical History  Procedure Laterality Date  . Tubal ligation  2000  . Eye surgery      laser eye surgery  . Cardiac catheterization N/A 10/03/2014    Procedure: Left Heart Cath and Coronary Angiography;  Surgeon: Lorretta Harp, MD;  Location: [facility];  Service: Cardiovascular;  Laterality: N/A;  . Dilitation & currettage/hystroscopy with novasure ablation N/A 11/08/2014    Procedure: DILATATION & CURETTAGE/HYSTEROSCOPY WITH NOVASURE ABLATION;  Surgeon: Linda Hedges, DO;  Location: [facility];  Service: Gynecology;  Laterality: N/A;  . Laparoscopic vaginal hysterectomy with salpingectomy Bilateral 03/05/2015    Procedure: LAPAROSCOPIC ASSISTED VAGINAL HYSTERECTOMY WITH SALPINGECTOMY;   Surgeon: Linda Hedges, DO;  Location: [facility];  Service: Gynecology;  Laterality: Bilateral;    There were no vitals filed for this visit.  Visit Diagnosis:  Perineal pain in female - Plan: PT plan of care cert/re-cert      Subjective Assessment - 04/30/15 1154    Subjective Patient reports intermittent pain in pubic area when she is going to the bathroom. Pain wears her out. Bowels range from constipation to diarreahia.  Had a hysterectomy on 03/05/2015 but did not help the pain. Patient pain was sudden onset that stared in the abdomen but now is suprapubically.     Patient Stated Goals reduce pain   Currently in Pain? Yes   Pain Score 10-Worst pain ever   Pain Location Pelvis   Pain Orientation Mid   Pain Descriptors / Indicators Sharp   Pain Type Chronic pain   Pain Onset More than a month ago   Pain Frequency Intermittent   Aggravating Factors  having a bowel movement   Pain Relieving Factors after a bowel movement   Effect of Pain on Daily Activities makes patient feel weak            [facility] PT Assessment - 04/30/15 0001    Assessment   Medical Diagnosis R10.819 suprapubic tenderness    Referring Provider Dr. Carolan Clines   Onset Date/Surgical Date 03/22/13   Prior Therapy None   Precautions   Precautions Other (comment)   Precaution Comments no ultrsound   Balance Screen  Has the patient fallen in the past 6 months No   Has the patient had a decrease in activity level because of a fear of falling?  No   Is the patient reluctant to leave their home because of a fear of falling?  No   Prior Function   Level of Independence Independent   Cognition   Overall Cognitive Status Within Functional Limits for tasks assessed   Observation/Other Assessments   Focus on Therapeutic Outcomes (FOTO)  59% bowel survey   Posture/Postural Control   Posture/Postural Control Postural limitations   Postural Limitations Rounded Shoulders;Forward head   AROM   Overall AROM  Comments lumbar ROM is full   Palpation   Palpation comment tenderness located in sacral area                 Pelvic Floor Special Questions - 04/30/15 0001    Marinoff Scale no problems   Urinary Leakage Yes   Activities that cause leaking Coughing;Sneezing;Laughing   Pelvic Floor Internal Exam Patient confirms identification and approves physical therapist to assess muscle integrity   Exam Type Vaginal   Palpation no tenderness   Strength fair squeeze, definite lift                  PT Education - 04/30/15 1221    Education provided Yes   Education Details toileting technique, diaphragmatic breathing, pelvic floor contraction   Person(s) Educated Patient   Methods Explanation;Demonstration;Handout;Verbal cues   Comprehension Returned demonstration;Verbalized understanding          PT Short Term Goals - 04/30/15 1302    PT SHORT TERM GOAL #1   Title understand correct toileting technique to have a bowel movement to decrease spasms.    Time 4   Period Weeks   Status New   PT SHORT TERM GOAL #2   Title independent with diaphragmatic breathing while having a bowel movement to reduce muscle tension   Time 4   Period Weeks   Status New           PT Long Term Goals - 04/30/15 1304    PT LONG TERM GOAL #1   Title pain with bowel movement decreased >/= 50% due to using muscle relaxtion techniques to decrease spasm   Time 8   Period Weeks   Status New   PT LONG TERM GOAL #2   Title urinary leakage decreased >/= 50% due to increase pelvic floor strength   Time 8   Period Weeks   Status New   PT LONG TERM GOAL #3   Title independent with HEP    Time 8   Period Weeks   Status New               Plan - 04/30/15 1225    Clinical Impression Statement Patient is a 45 year old female with diagnosis of suprapubic tenderness that started suddenly 2 years ago.  Patient reports pain level 10/10 in the pelvic region when she is having a bowel  movement.  Patient does not have the pain any other time.  Patient reports no pain with intercourse.  Patient reports she is weakness after a bowel movement.  Patient reports the pain is like contractions for when she had a baby.  Pelvic floor strength is 3/5.  Patient reports urinary leakage with sneezing, laughing, and coughing.  Patient was educated on correct technique to have a bowel movement, diaphgramatic breathing  when she has pain, and pelvic floor  exercise to reduce leakage.  Patient would benefit from physical therapy to reduce leakage and educate on how to relax pelvic floor  with bowel movement.   Pt will benefit from skilled therapeutic intervention in order to improve on the following deficits Pain;Decreased strength   Rehab Potential Excellent   Clinical Impairments Affecting Rehab Potential None   PT Frequency 1x / week   PT Duration 8 weeks   PT Treatment/Interventions Patient/family education;Neuromuscular re-education;Electrical Stimulation;Moist Heat;Therapeutic exercise;Therapeutic activities   PT Next Visit Plan pelvic floor relaxation with bowel movement; see what MD says   PT Home Exercise Plan current HEP   Consulted and Agree with Plan of Care Patient         Problem List Patient Active Problem List   Diagnosis Date Noted  . S/P hysterectomy 03/05/2015  . Abdominal pain 01/14/2015  . Constipation 01/14/2015  . Severe obesity (BMI >= 40) ([place]) 12/23/2014  . Morbid obesity ([place]) 12/23/2014  . Radial artery occlusion, right ([place]) 12/20/2014  . HTN (hypertension) 11/04/2014  . Right arm pain 11/04/2014  . HTN, goal below 130/80 11/04/2014  . Cough 11/04/2014  . Chest pain 10/01/2014  . Vitamin D deficiency 08/26/2014  . Cramp in lower leg 08/26/2014  . Right thyroid nodule 07/22/2014  . Hyperparathyroidism ([place]) 07/22/2014  . Myeloproliferative disorder ([place]) 03/08/2013  . Leukocytosis 11/15/2012  . Chronic neutrophilia 11/15/2012  . Pruritus 11/15/2012   . Monocytosis 11/15/2012    Earlie Counts, PT 04/30/2015 1:08 PM   [facility] [address] [place], [place], [address] Phone: [phone]   Fax:  [phone]  Name: Brianna Fletcher MRN: [id number] Date of Birth: [birth year]   PHYSICAL THERAPY DISCHARGE SUMMARY  Visits from Start of Care: 1  Current functional level related to goals / functional outcomes:  See above. Patient did not return after initial evaluation.    Remaining deficits: See above   Education / Equipment: HEP Plan:                                                    Patient goals were not met. Patient is being discharged due to not returning since the last visit.  Thank you for the referral. Earlie Counts, PT 07/17/2015 3:41 PM  ?????

## 2015-05-02 MED FILL — DICYCLOMINE 20 MG TABLET: 20 | 30 days supply | Qty: 120 | Fill #0

## 2015-05-05 ENCOUNTER — Ambulatory Visit: Payer: BLUE CROSS/BLUE SHIELD | Admitting: Family Medicine

## 2015-05-06 ENCOUNTER — Encounter: Payer: Self-pay | Admitting: Family Medicine

## 2015-05-06 ENCOUNTER — Ambulatory Visit (INDEPENDENT_AMBULATORY_CARE_PROVIDER_SITE_OTHER): Payer: BLUE CROSS/BLUE SHIELD | Admitting: Family Medicine

## 2015-05-06 VITALS — BP 132/76 | HR 63 | Temp 98.1°F | Wt 229.6 lb

## 2015-05-06 DIAGNOSIS — N644 Mastodynia: Secondary | ICD-10-CM

## 2015-05-06 DIAGNOSIS — K219 Gastro-esophageal reflux disease without esophagitis: Secondary | ICD-10-CM | POA: Diagnosis not present

## 2015-05-06 DIAGNOSIS — K5901 Slow transit constipation: Secondary | ICD-10-CM | POA: Diagnosis not present

## 2015-05-06 DIAGNOSIS — R0982 Postnasal drip: Secondary | ICD-10-CM | POA: Diagnosis not present

## 2015-05-06 MED ORDER — LEVOCETIRIZINE DIHYDROCHLORIDE 5 MG PO TABS: 5.0000 mg | ORAL_TABLET | Freq: Every evening | ORAL | Status: DC

## 2015-05-06 MED ORDER — LUBIPROSTONE 24 MCG PO CAPS: 24.0000 ug | ORAL_CAPSULE | Freq: Two times a day (BID) | ORAL | Status: DC

## 2015-05-06 NOTE — Progress Notes (Signed)
Pre visit review using our clinic review tool, if applicable. No additional management support is needed unless otherwise documented below in the visit note. 

## 2015-05-06 NOTE — Progress Notes (Signed)
Patient ID: Brianna Fletcher, female    DOB: 14-May-1970  Age: 45 y.o. MRN: [id number]    Subjective:  Subjective HPI Brianna Fletcher presents with c/o IBS symptoms.  Severe cramping after eating and then she has a BM.  This has been going on for months. She is also c/o L breast pain x several days.  No errythema, not hot to touch.   Review of Systems  Constitutional: Negative for diaphoresis, appetite change, fatigue and unexpected weight change.  Eyes: Negative for pain, redness and visual disturbance.  Respiratory: Negative for cough, chest tightness, shortness of breath and wheezing.   Cardiovascular: Negative for chest pain, palpitations and leg swelling.  Endocrine: Negative for cold intolerance, heat intolerance, polydipsia, polyphagia and polyuria.  Genitourinary: Negative for dysuria, frequency and difficulty urinating.  Neurological: Negative for dizziness, light-headedness, numbness and headaches.    History Past Medical History  Diagnosis Date  . Glaucoma   . Arthritis   . Leukocytosis, unspecified 11/15/2012  . Chronic neutrophilia 11/15/2012  . Pruritus 11/15/2012  . Monocytosis 11/15/2012  . Myeloproliferative disorder ([place]) 03/08/2013  . Precordial chest pain   . Family history of heart disease     mother had stents placed in her 68s and had myocardial infarctions  . Radial artery occlusion, right (HCC)     status post  right radial artery cardiac catheterization  . Blood dyscrasia     produces too many WBCs  . Vaginal delivery 1986, 2000  . Hypertension     She has past surgical history that includes Tubal ligation (2000); Eye surgery; Cardiac catheterization (N/A, 10/03/2014); Dilatation & currettage/hysteroscopy with novasure ablation (N/A, 11/08/2014); and Laparoscopic vaginal hysterectomy with salpingectomy (Bilateral, 03/05/2015).   Her family history includes Cancer in her father; Dementia in her maternal grandmother; Diabetes in her father; Heart disease in  her cousin, maternal grandmother, and mother; Heart disease (age of onset: 6) in her maternal grandfather; Hyperlipidemia in her mother; Hypertension in her mother. There is no history of Other.She reports that she has quit smoking. Her smoking use included Cigarettes. She started smoking about 8 months ago. She has never used smokeless tobacco. She reports that she drinks about 3.6 oz of alcohol per week. She reports that she does not use illicit drugs.  Current Outpatient Prescriptions on File Prior to Visit  Medication Sig Dispense Refill  . amLODipine (NORVASC) 5 MG tablet Take 1 tablet (5 mg total) by mouth daily. 90 tablet 3  . aspirin 81 MG tablet Take 81 mg by mouth daily.    Marland Kitchen doxepin (SINEQUAN) 50 MG capsule TAKE 1 CAPSULE BY MOUTH AT BEDTIME 30 capsule 3  . ibuprofen (ADVIL,MOTRIN) 800 MG tablet Take 1 tablet (800 mg total) by mouth every 6 (six) hours as needed. 30 tablet 1  . omeprazole (PRILOSEC) 20 MG capsule TAKE 1 CAPSULE (20 MG TOTAL) BY MOUTH DAILY. 30 capsule 11  . oxyCODONE-acetaminophen (PERCOCET/ROXICET) 5-325 MG tablet Take 1-2 tablets by mouth every 4 (four) hours as needed (moderate to severe pain (when tolerating fluids)). 30 tablet 0  . PEGASYS 180 MCG/ML injection Inject 0.45 mLs (81 mcg total) into the skin every 7 (seven) days. (Patient taking differently: Inject 180 mcg into the skin every 21 ( twenty-one) days. ) 4 mL 3   Current Facility-Administered Medications on File Prior to Visit  Medication Dose Route Frequency Provider Last Rate Last Dose  . acetaminophen (TYLENOL) tablet 650 mg  650 mg Oral Once [name], MD  650 mg at 04/26/14 1516  . peginterferon alfa-2a (PEGASYS) injection 81 mcg  81 mcg Subcutaneous Weekly [name], MD   81 mcg at 04/26/14 1530     Objective:  Objective Physical Exam  Constitutional: She is oriented to person, place, and time. She appears well-developed and well-nourished.  HENT:  Head: Normocephalic and  atraumatic.  Eyes: Conjunctivae and EOM are normal.  Neck: Normal range of motion. Neck supple. No JVD present. Carotid bruit is not present. No thyromegaly present.  Cardiovascular: Normal rate, regular rhythm and normal heart sounds.   No murmur heard. Pulmonary/Chest: Effort normal and breath sounds normal. No respiratory distress. She has no wheezes. She has no rales. She exhibits no tenderness. Left breast exhibits tenderness.    Musculoskeletal: She exhibits no edema.  Neurological: She is alert and oriented to person, place, and time.  Psychiatric: She has a normal mood and affect. Her behavior is normal. Judgment and thought content normal.  Nursing note and vitals reviewed.  BP 132/76 mmHg  Pulse 63  Temp(Src) 98.1 F (36.7 C) (Oral)  Wt 229 lb 9.6 oz (104.146 kg)  SpO2 99% Wt Readings from Last 3 Encounters:  05/06/15 229 lb 9.6 oz (104.146 kg)  04/04/15 228 lb (103.42 kg)  03/05/15 228 lb (103.42 kg)     Lab Results  Component Value Date   WBC 18.5* 04/04/2015   HGB 14.0 04/04/2015   HCT 43.0 04/04/2015   PLT 297 04/04/2015   GLUCOSE 88 04/04/2015   CHOL 146 10/15/2014   TRIG 102.0 10/15/2014   HDL 40.70 10/15/2014   LDLCALC 85 10/15/2014   ALT 9 04/04/2015   AST 13 04/04/2015   NA 139 04/04/2015   K 4.3 04/04/2015   CL 104 03/06/2015   CREATININE 0.8 04/04/2015   BUN 11.9 04/04/2015   CO2 24 04/04/2015   TSH 2.91 10/15/2014   INR 0.91 10/01/2014    Mm Screening Breast Tomo Bilateral  03/05/2015  CLINICAL DATA:  Screening. EXAM: DIGITAL SCREENING BILATERAL MAMMOGRAM WITH 3D TOMO WITH CAD COMPARISON:  Previous exam(s). ACR Breast Density Category c: The breast tissue is heterogeneously dense, which may obscure small masses. FINDINGS: There are no findings suspicious for malignancy. Images were processed with CAD. IMPRESSION: No mammographic evidence of malignancy. A result letter of this screening mammogram will be mailed directly to the patient.  RECOMMENDATION: Screening mammogram in one year. (Code:[id number]-B-01Y) BI-RADS CATEGORY  1: Negative. Electronically Signed   By: Lajean Manes M.D.   On: 03/05/2015 10:01     Assessment & Plan:  Plan I have discontinued Ms. Radich's MYRBETRIQ. I am also having her start on lubiprostone and levocetirizine. Additionally, I am having her maintain her aspirin, amLODipine, omeprazole, PEGASYS, doxepin, oxyCODONE-acetaminophen, ibuprofen, and dicyclomine.  Meds ordered this encounter  Medications  . dicyclomine (BENTYL) 20 MG tablet    Sig: Take 1 tablet by mouth 4 (four) times daily as needed.    Refill:  0  . lubiprostone (AMITIZA) 24 MCG capsule    Sig: Take 1 capsule (24 mcg total) by mouth 2 (two) times daily with a meal.    Dispense:  60 capsule    Refill:  5  . levocetirizine (XYZAL) 5 MG tablet    Sig: Take 1 tablet (5 mg total) by mouth every evening.    Dispense:  30 tablet    Refill:  5    Problem List Items Addressed This Visit    None    Visit Diagnoses  Slow transit constipation    -  Primary    Relevant Medications    lubiprostone (AMITIZA) 24 MCG capsule    Other Relevant Orders    Ambulatory referral to Gastroenterology    Gastroesophageal reflux disease, esophagitis presence not specified        Relevant Medications    dicyclomine (BENTYL) 20 MG tablet    lubiprostone (AMITIZA) 24 MCG capsule    Post-nasal drip        Relevant Medications    levocetirizine (XYZAL) 5 MG tablet    Breast pain, left        Relevant Orders    US BREAST COMPLETE UNI LEFT INC AXILLA    MM Digital Diagnostic Bilat       Follow-up: Return if symptoms worsen or fail to improve.  Garnet Koyanagi, DO

## 2015-05-06 NOTE — Patient Instructions (Signed)
Irritable Bowel Syndrome, Adult Irritable bowel syndrome (IBS) is not one specific disease. It is a group of symptoms that affects the organs responsible for digestion (gastrointestinal or GI tract).  To regulate how your GI tract works, your body sends signals back and forth between your intestines and your brain. If you have IBS, there may be a problem with these signals. As a result, your GI tract does not function normally. Your intestines may become more sensitive and overreact to certain things. This is especially true when you eat certain foods or when you are under stress.  There are four types of IBS. These may be determined based on the consistency of your stool:   IBS with diarrhea.   IBS with constipation.   Mixed IBS.   Unsubtyped IBS.  It is important to know which type of IBS you have. Some treatments are more likely to be helpful for certain types of IBS.  CAUSES  The exact cause of IBS is not known. RISK FACTORS You may have a higher risk of IBS if:  You are a woman.  You are younger than 47 years old.  You have a family history of IBS.  You have mental health problems.  You have had bacterial infection of your GI tract. SIGNS AND SYMPTOMS  Symptoms of IBS vary from person to person. The main symptom is abdominal pain or discomfort. Additional symptoms usually include one or more of the following:   Diarrhea, constipation, or both.   Abdominal swelling or bloating.   Feeling full or sick after eating a small or regular-size meal.   Frequent gas.   Mucus in the stool.   A feeling of having more stool left after a bowel movement.  Symptoms tend to come and go. They may be associated with stress, psychiatric conditions, or nothing at all.  DIAGNOSIS  There is no specific test to diagnose IBS. Your health care provider will make a diagnosis based on a physical exam, medical history, and your symptoms. You may have other tests to rule out other  conditions that may be causing your symptoms. These may include:   Blood tests.   X-rays.   CT scan.  Endoscopy and colonoscopy. This is a test in which your GI tract is viewed with a long, thin, flexible tube. TREATMENT There is no cure for IBS, but treatment can help relieve symptoms. IBS treatment often includes:   Changes to your diet, such as:  Eating more fiber.  Avoiding foods that cause symptoms.  Drinking more water.  Eating regular, medium-sized portioned meals.  Medicines. These may include:  Fiber supplements if you have constipation.  Medicine to control diarrhea (antidiarrheal medicines).  Medicine to help control muscle spasms in your GI tract (antispasmodic medicines).  Medicines to help with any mental health issues, such as antidepressants or tranquilizers.  Therapy.  Talk therapy may help with anxiety, depression, or other mental health issues that can make IBS symptoms worse.  Stress reduction.  Managing your stress can help keep symptoms under control. HOME CARE INSTRUCTIONS   Take medicines only as directed by your health care provider.  Eat a healthy diet.  Avoid foods and drinks with added sugar.  Include more whole grains, fruits, and vegetables gradually into your diet. This may be especially helpful if you have IBS with constipation.  Avoid any foods and drinks that make your symptoms worse. These may include dairy products and caffeinated or carbonated drinks.  Do not eat large meals.    Drink enough fluid to keep your urine clear or pale yellow.  Exercise regularly. Ask your health care provider for recommendations of good activities for you.  Keep all follow-up visits as directed by your health care provider. This is important. SEEK MEDICAL CARE IF:   You have constant pain.  You have trouble or pain with swallowing.  You have worsening diarrhea. SEEK IMMEDIATE MEDICAL CARE IF:   You have severe and worsening abdominal  pain.   You have diarrhea and:   You have a rash, stiff neck, or severe headache.   You are irritable, sleepy, or difficult to awaken.   You are weak, dizzy, or extremely thirsty.   You have bright red blood in your stool or you have black tarry stools.   You have unusual abdominal swelling that is painful.   You vomit continuously.   You vomit blood (hematemesis).   You have both abdominal pain and a fever.    This information is not intended to replace advice given to you by your health care provider. Make sure you discuss any questions you have with your health care provider.   Document Released: 03/08/2005 Document Revised: 03/29/2014 Document Reviewed: 11/23/2013 Elsevier Interactive Patient Education 2016 [employer]  

## 2015-05-07 ENCOUNTER — Telehealth: Payer: Self-pay | Admitting: Hematology & Oncology

## 2015-05-07 NOTE — Telephone Encounter (Signed)
Pt has been approved for reimbursement for cost sharing associated with specific, documented out-of-pocket medication expenses related to MPN.   Valid: 04/14/2015 - 04/12/2016 Grant: $[id number],[id number] PRN: [id number]       COPY SCANNED

## 2015-05-11 ENCOUNTER — Other Ambulatory Visit: Payer: Self-pay | Admitting: Family Medicine

## 2015-05-11 DIAGNOSIS — K625 Hemorrhage of anus and rectum: Secondary | ICD-10-CM | POA: Insufficient documentation

## 2015-05-11 DIAGNOSIS — R1084 Generalized abdominal pain: Secondary | ICD-10-CM | POA: Insufficient documentation

## 2015-05-11 DIAGNOSIS — N644 Mastodynia: Secondary | ICD-10-CM

## 2015-05-11 DIAGNOSIS — R194 Change in bowel habit: Secondary | ICD-10-CM | POA: Insufficient documentation

## 2015-05-12 NOTE — Addendum Note (Signed)
Addended by: Rudene Anda on: 05/12/2015 02:37 PM   Modules accepted: Orders

## 2015-05-21 ENCOUNTER — Other Ambulatory Visit: Payer: BLUE CROSS/BLUE SHIELD

## 2015-05-22 ENCOUNTER — Other Ambulatory Visit (HOSPITAL_BASED_OUTPATIENT_CLINIC_OR_DEPARTMENT_OTHER): Payer: BLUE CROSS/BLUE SHIELD

## 2015-05-22 ENCOUNTER — Ambulatory Visit (HOSPITAL_BASED_OUTPATIENT_CLINIC_OR_DEPARTMENT_OTHER): Payer: BLUE CROSS/BLUE SHIELD | Admitting: Hematology & Oncology

## 2015-05-22 ENCOUNTER — Encounter: Payer: Self-pay | Admitting: Hematology & Oncology

## 2015-05-22 VITALS — BP 134/79 | HR 68 | Temp 97.6°F | Resp 16 | Ht 67.0 in | Wt 231.0 lb

## 2015-05-22 DIAGNOSIS — D471 Chronic myeloproliferative disease: Secondary | ICD-10-CM

## 2015-05-22 DIAGNOSIS — D72828 Other elevated white blood cell count: Secondary | ICD-10-CM

## 2015-05-22 LAB — COMPREHENSIVE METABOLIC PANEL
ALBUMIN: 3.7 g/dL (ref 3.5–5.0)
ALK PHOS: 148 U/L (ref 40–150)
ALT: 9 U/L (ref 0–55)
ANION GAP: 9 meq/L (ref 3–11)
AST: 15 U/L (ref 5–34)
BILIRUBIN TOTAL: 0.45 mg/dL (ref 0.20–1.20)
BUN: 11.2 mg/dL (ref 7.0–26.0)
CALCIUM: 10.1 mg/dL (ref 8.4–10.4)
CHLORIDE: 108 meq/L (ref 98–109)
CO2: 20 mEq/L — ABNORMAL LOW (ref 22–29)
CREATININE: 0.8 mg/dL (ref 0.6–1.1)
EGFR: 84 mL/min/{1.73_m2} — ABNORMAL LOW (ref >90)
Glucose: 90 mg/dl (ref 70–140)
Potassium: 4 mEq/L (ref 3.5–5.1)
Sodium: 137 mEq/L (ref 136–145)
TOTAL PROTEIN: 7.7 g/dL (ref 6.4–8.3)

## 2015-05-22 LAB — CBC WITH DIFFERENTIAL (CANCER CENTER ONLY)
BASO#: 0 10*3/uL (ref 0.0–0.2)
BASO%: 0.1 % (ref 0.0–2.0)
EOS ABS: 0.1 10*3/uL (ref 0.0–0.5)
EOS%: 0.4 % (ref 0.0–7.0)
HCT: 40.7 % (ref 34.8–46.6)
HEMOGLOBIN: 13.6 g/dL (ref 11.6–15.9)
LYMPH#: 2.1 10*3/uL (ref 0.9–3.3)
LYMPH%: 12 % — AB (ref 14.0–48.0)
MCH: 31.1 pg (ref 26.0–34.0)
MCHC: 33.4 g/dL (ref 32.0–36.0)
MCV: 93 fL (ref 81–101)
MONO#: 1 10*3/uL — ABNORMAL HIGH (ref 0.1–0.9)
MONO%: 5.9 % (ref 0.0–13.0)
NEUT%: 81.6 % — ABNORMAL HIGH (ref 39.6–80.0)
NEUTROS ABS: 14.5 10*3/uL — AB (ref 1.5–6.5)
Platelets: 223 10*3/uL (ref 145–400)
RBC: 4.38 10*6/uL (ref 3.70–5.32)
RDW: 13.9 % (ref 11.1–15.7)
WBC: 17.7 10*3/uL — AB (ref 3.9–10.0)

## 2015-05-22 LAB — LACTATE DEHYDROGENASE: LDH: 124 U/L — AB (ref 125–245)

## 2015-05-22 LAB — CHCC SATELLITE - SMEAR

## 2015-05-22 LAB — TECHNOLOGIST REVIEW CHCC SATELLITE

## 2015-05-22 NOTE — Progress Notes (Signed)
Hematology and Oncology Follow Up Visit  Brianna Fletcher [id number] 08/24/1970 45 y.o. 05/22/2015   Principle Diagnosis:  Chronic myeloproliferative syndrome-Triple negative  Current Therapy:   Peg-Interferon q weekly - q 3 week dosing.     Interim History:  [name] is back for followup. Lost her job. Her company closed. Now, she is to try to find a new job.  Currently, she is having problems with her intestines. She was having a lot of problems constipation. She saw a gastroenterologist. He has been working very hard with her. He put her on amitiza which is causing quite a few side effects for her. [name] only been on it for one week. I told her to give it a couple more weeks to see the someone side effects resolved.  [name] doing well with the interferon. She really has had no problems with this.  [name] had no fever. [name] had no cough. [name] had no nausea vomiting. [name] had no rashes. [name] had no joint issues.  Overall, her performance status is ECOG 1.  Medications:  Current outpatient prescriptions:  .  amLODipine (NORVASC) 5 MG tablet, Take 1 tablet (5 mg total) by mouth daily., Disp: 90 tablet, Rfl: 3 .  aspirin 81 MG tablet, Take 81 mg by mouth daily., Disp: , Rfl:  .  doxepin (SINEQUAN) 50 MG capsule, TAKE 1 CAPSULE BY MOUTH AT BEDTIME, Disp: 30 capsule, Rfl: 3 .  levocetirizine (XYZAL) 5 MG tablet, Take 1 tablet (5 mg total) by mouth every evening., Disp: 30 tablet, Rfl: 5 .  lubiprostone (AMITIZA) 24 MCG capsule, Take 1 capsule (24 mcg total) by mouth 2 (two) times daily with a meal., Disp: 60 capsule, Rfl: 5 .  omeprazole (PRILOSEC) 20 MG capsule, TAKE 1 CAPSULE (20 MG TOTAL) BY MOUTH DAILY., Disp: 30 capsule, Rfl: 11 .  PEGASYS 180 MCG/ML injection, Inject 0.45 mLs (81 mcg total) into the skin every 7 (seven) days. (Patient taking differently: Inject 180 mcg into the skin every 21 ( twenty-one) days. ), Disp: 4 mL, Rfl: 3 No current facility-administered medications  for this visit.  Facility-Administered Medications Ordered in Other Visits:  .  acetaminophen (TYLENOL) tablet 650 mg, 650 mg, Oral, Once, [name], MD, 650 mg at 04/26/14 1516 .  peginterferon alfa-2a (PEGASYS) injection 81 mcg, 81 mcg, Subcutaneous, Weekly, [name], MD, 81 mcg at 04/26/14 1530  Allergies:  Allergies  Allergen Reactions  . Lisinopril Cough  . Losartan Cough    Past Medical History, Surgical history, Social history, and Family History were reviewed and updated.  Review of Systems: As above  Physical Exam:  height is 5\' 7"  (1.702 m) and weight is 231 lb (104.781 kg). Her oral temperature is 97.6 F (36.4 C). Her blood pressure is 134/79 and her pulse is 68. Her respiration is 16.   Well-developed and well-nourished white female. Head and neck exam shows no ocular or oral lesions. She has no palpable cervical or supraclavicular lymph nodes. Lungs are clear. Cardiac exam regular rate and rhythm with no murmurs rubs or bruits. Abdomen is soft. She has good bowel sounds. She has healing Brosky be scars. There is no fluid wave. There is no palpable liver or spleen tip. Back exam shows no tenderness over the spine or ribs. There is some tenderness over the right hip area. This is chronic. She has some slight decreased range of motion of the right hip. Extremities shows no clubbing, cyanosis or edema. Skin exam no  rashes, ecchymosis or petechia. Her skin is a little dry. Neurological exam shows no focal neurological deficits.  Lab Results  Component Value Date   WBC 17.7* 05/22/2015   HGB 13.6 05/22/2015   HCT 40.7 05/22/2015   MCV 93 05/22/2015   PLT 223 05/22/2015     Chemistry      Component Value Date/Time   NA 139 04/04/2015 0858   NA 137 03/06/2015 0518   NA 145 10/04/2014 1357   K 4.3 04/04/2015 0858   K 4.1 03/06/2015 0518   K 4.1 10/04/2014 1357   CL 104 03/06/2015 0518   CL 107 10/04/2014 1357   CO2 24 04/04/2015 0858   CO2 26 03/06/2015  0518   CO2 26 10/04/2014 1357   BUN 11.9 04/04/2015 0858   BUN 10 03/06/2015 0518   BUN 11 10/04/2014 1357   CREATININE 0.8 04/04/2015 0858   CREATININE 0.63 03/06/2015 0518   CREATININE 0.69 01/08/2015 1716      Component Value Date/Time   CALCIUM 10.6* 04/04/2015 0858   CALCIUM 9.9 03/06/2015 0518   CALCIUM 10.3 10/04/2014 1357   ALKPHOS 129 04/04/2015 0858   ALKPHOS 93 03/06/2015 0518   ALKPHOS 146* 10/04/2014 1357   AST 13 04/04/2015 0858   AST 14* 03/06/2015 0518   AST 25 10/04/2014 1357   ALT 9 04/04/2015 0858   ALT 12* 03/06/2015 0518   ALT 19 10/04/2014 1357   BILITOT 0.55 04/04/2015 0858   BILITOT 0.5 03/06/2015 0518   BILITOT 0.90 10/04/2014 1357         Impression and Plan: Brianna Fletcher is 45 year old female. She has a myeloproliferative syndrome. So far, our workup has been totally negative.  I am glad that her white cell count is pending downward. I will look at her blood on the microscope and did not see anything that looked suspicious.  I think every 3 week dosing would be perfect for her. ID for white cell count remains stable, then maybe a week ago once a month dosing.  I do feel bad about the issues that she is having with her bowels. She is seeing a gastroenterologist. He does have her on amitiza.  For now, I think we can probably get her back in another 6 weeks.   I spent about 30 minutes with her today.   [name], MD 3/2/201710:04 AM

## 2015-05-23 LAB — RETICULOCYTES: Reticulocyte Count: 1.8 % (ref 0.6–2.6)

## 2015-05-29 ENCOUNTER — Other Ambulatory Visit: Payer: Self-pay | Admitting: *Deleted

## 2015-05-29 DIAGNOSIS — D72828 Other elevated white blood cell count: Secondary | ICD-10-CM

## 2015-05-29 MED ORDER — PEGASYS 180 MCG/ML ~~LOC~~ SOLN: 81.0000 ug | SUBCUTANEOUS | Status: DC

## 2015-07-02 ENCOUNTER — Telehealth: Payer: Self-pay

## 2015-07-02 NOTE — Telephone Encounter (Signed)
Pre visit call made to patient. Left message for return call. 

## 2015-07-03 ENCOUNTER — Ambulatory Visit (INDEPENDENT_AMBULATORY_CARE_PROVIDER_SITE_OTHER): Payer: Medicaid Other | Admitting: Family Medicine

## 2015-07-03 ENCOUNTER — Ambulatory Visit (HOSPITAL_BASED_OUTPATIENT_CLINIC_OR_DEPARTMENT_OTHER)
Admission: RE | Admit: 2015-07-03 | Discharge: 2015-07-03 | Disposition: A | Discharge status: Home / Self Care | Payer: Medicaid Other | Source: Ambulatory Visit | Attending: Family Medicine | Admitting: Family Medicine

## 2015-07-03 ENCOUNTER — Encounter: Payer: Self-pay | Admitting: Hematology & Oncology

## 2015-07-03 ENCOUNTER — Other Ambulatory Visit (HOSPITAL_BASED_OUTPATIENT_CLINIC_OR_DEPARTMENT_OTHER): Payer: Medicaid Other

## 2015-07-03 ENCOUNTER — Encounter: Payer: Self-pay | Admitting: Family Medicine

## 2015-07-03 ENCOUNTER — Ambulatory Visit (HOSPITAL_BASED_OUTPATIENT_CLINIC_OR_DEPARTMENT_OTHER): Payer: Medicaid Other | Admitting: Hematology & Oncology

## 2015-07-03 VITALS — BP 131/81 | HR 65 | Temp 98.8°F | Ht 67.0 in | Wt 237.8 lb

## 2015-07-03 VITALS — BP 145/77 | HR 63 | Temp 97.7°F | Resp 20 | Ht 67.0 in | Wt 237.0 lb

## 2015-07-03 DIAGNOSIS — M79605 Pain in left leg: Secondary | ICD-10-CM

## 2015-07-03 DIAGNOSIS — D471 Chronic myeloproliferative disease: Secondary | ICD-10-CM | POA: Diagnosis present

## 2015-07-03 DIAGNOSIS — M79662 Pain in left lower leg: Secondary | ICD-10-CM | POA: Insufficient documentation

## 2015-07-03 LAB — CBC WITH DIFFERENTIAL (CANCER CENTER ONLY)
BASO#: 0 10*3/uL (ref 0.0–0.2)
BASO%: 0.1 % (ref 0.0–2.0)
EOS%: 0.6 % (ref 0.0–7.0)
Eosinophils Absolute: 0.1 10*3/uL (ref 0.0–0.5)
HCT: 39.1 % (ref 34.8–46.6)
HGB: 13.2 g/dL (ref 11.6–15.9)
LYMPH#: 2.4 10*3/uL (ref 0.9–3.3)
LYMPH%: 15 % (ref 14.0–48.0)
MCH: 31.5 pg (ref 26.0–34.0)
MCHC: 33.8 g/dL (ref 32.0–36.0)
MCV: 93 fL (ref 81–101)
MONO#: 1 10*3/uL — AB (ref 0.1–0.9)
MONO%: 6.5 % (ref 0.0–13.0)
NEUT#: 12.3 10*3/uL — ABNORMAL HIGH (ref 1.5–6.5)
NEUT%: 77.8 % (ref 39.6–80.0)
PLATELETS: 252 10*3/uL (ref 145–400)
RBC: 4.19 10*6/uL (ref 3.70–5.32)
RDW: 13.4 % (ref 11.1–15.7)
WBC: 15.9 10*3/uL — AB (ref 3.9–10.0)

## 2015-07-03 LAB — COMPREHENSIVE METABOLIC PANEL
ALT: <9 U/L (ref 0–55)
ANION GAP: 9 meq/L (ref 3–11)
AST: 14 U/L (ref 5–34)
Albumin: 3.9 g/dL (ref 3.5–5.0)
Alkaline Phosphatase: 117 U/L (ref 40–150)
BILIRUBIN TOTAL: 0.4 mg/dL (ref 0.20–1.20)
BUN: 11.7 mg/dL (ref 7.0–26.0)
CALCIUM: 10.3 mg/dL (ref 8.4–10.4)
CHLORIDE: 108 meq/L (ref 98–109)
CO2: 21 meq/L — AB (ref 22–29)
Creatinine: 0.7 mg/dL (ref 0.6–1.1)
Glucose: 88 mg/dl (ref 70–140)
Potassium: 4.4 mEq/L (ref 3.5–5.1)
Sodium: 137 mEq/L (ref 136–145)
Total Protein: 7.7 g/dL (ref 6.4–8.3)

## 2015-07-03 LAB — IRON AND TIBC
%SAT: 21 % (ref 21–57)
IRON: 67 ug/dL (ref 41–142)
TIBC: 319 ug/dL (ref 236–444)
UIBC: 251 ug/dL (ref 120–384)

## 2015-07-03 LAB — CHCC SATELLITE - SMEAR

## 2015-07-03 LAB — FERRITIN: Ferritin: 69 ng/ml (ref 9–269)

## 2015-07-03 LAB — LACTATE DEHYDROGENASE: LDH: 162 U/L (ref 125–245)

## 2015-07-03 NOTE — Progress Notes (Signed)
Hematology and Oncology Follow Up Visit  Brianna Fletcher [id number] 1970-09-26 45 y.o. 07/03/2015   Principle Diagnosis:  Chronic myeloproliferative syndrome-Triple negative  Current Therapy:   Peg-Interferon q weekly - q 4 week dosing.     Interim History:  Ms.  Brianna Fletcher is back for followup. She saw her family doctor earlier today. She was complaining of some pain in the left leg. She said that this past Saturday, the leg was red and swollen. This got better on its own. However, she is being set up for a Doppler by Dr. Etter Fletcher. I will like to think that the Doppler will be negative.  She is doing quite well with the interferon. [name] having no problems with this.   She is still looking for a new job. She wants to try to work from home if possible.   [name] had no rashes. She does have an occasional ecchymoses from the interferon injections.   [name] had no diarrhea.   Overall, her performance status is ECOG 1.  Medications:  Current outpatient prescriptions:  .  amLODipine (NORVASC) 5 MG tablet, Take 1 tablet (5 mg total) by mouth daily., Disp: 90 tablet, Rfl: 3 .  aspirin 81 MG tablet, Take 81 mg by mouth daily., Disp: , Rfl:  .  doxepin (SINEQUAN) 50 MG capsule, TAKE 1 CAPSULE BY MOUTH AT BEDTIME, Disp: 30 capsule, Rfl: 3 .  levocetirizine (XYZAL) 5 MG tablet, Take 1 tablet (5 mg total) by mouth every evening., Disp: 30 tablet, Rfl: 5 .  LINZESS 290 MCG CAPS capsule, Take 1 tablet by mouth daily. Has not started yet, Disp: , Rfl: 2 .  omeprazole (PRILOSEC) 20 MG capsule, TAKE 1 CAPSULE (20 MG TOTAL) BY MOUTH DAILY., Disp: 30 capsule, Rfl: 11 .  PEGASYS 180 MCG/ML injection, Inject 0.45 mLs (81 mcg total) into the skin every 21 ( twenty-one) days., Disp: 2 mL, Rfl: 3 No current facility-administered medications for this visit.  Facility-Administered Medications Ordered in Other Visits:  .  acetaminophen (TYLENOL) tablet 650 mg, 650 mg, Oral, Once, Brianna Napoleon, MD, 650 mg at  04/26/14 1516 .  peginterferon alfa-2a (PEGASYS) injection 81 mcg, 81 mcg, Subcutaneous, Weekly, Brianna Napoleon, MD, 81 mcg at 04/26/14 1530  Allergies:  Allergies  Allergen Reactions  . Amitiza [Lubiprostone]     Made her sick  . Lisinopril Cough  . Losartan Cough    Past Medical History, Surgical history, Social history, and Family History were reviewed and updated.  Review of Systems: As above  Physical Exam:  height is 5\' 7"  (1.702 m) and weight is 237 lb (107.502 kg). Her oral temperature is 97.7 F (36.5 C). Her blood pressure is 145/77 and her pulse is 63. Her respiration is 20.   Well-developed and well-nourished white female. Head and neck exam shows no ocular or oral lesions. She has no palpable cervical or supraclavicular lymph nodes. Lungs are clear. Cardiac exam regular rate and rhythm with no murmurs rubs or bruits. Abdomen is soft. She has good bowel sounds. She has healing Brianna Fletcher be scars. There is no fluid wave. There is no palpable liver or spleen tip. Back exam shows no tenderness over the spine or ribs. There is some tenderness over the right hip area. This is chronic. She has some slight decreased range of motion of the right hip. Extremities shows no clubbing, cyanosis or edema. She has minimal edema in the left lower leg. No obvious venous cord is palpable. She has a negative  Homans's sign in the left leg. She has good pulses in the distal arteries. Right leg is relatively unremarkable. Skin exam no rashes, ecchymosis or petechia. Her skin is a little dry. Neurological exam shows no focal neurological deficits.  Lab Results  Component Value Date   WBC 15.9* 07/03/2015   HGB 13.2 07/03/2015   HCT 39.1 07/03/2015   MCV 93 07/03/2015   PLT 252 07/03/2015     Chemistry      Component Value Date/Time   NA 137 05/22/2015 0836   NA 137 03/06/2015 0518   NA 145 10/04/2014 1357   K 4.0 05/22/2015 0836   K 4.1 03/06/2015 0518   K 4.1 10/04/2014 1357   CL 104  03/06/2015 0518   CL 107 10/04/2014 1357   CO2 20* 05/22/2015 0836   CO2 26 03/06/2015 0518   CO2 26 10/04/2014 1357   BUN 11.2 05/22/2015 0836   BUN 10 03/06/2015 0518   BUN 11 10/04/2014 1357   CREATININE 0.8 05/22/2015 0836   CREATININE 0.63 03/06/2015 0518   CREATININE 0.69 01/08/2015 1716      Component Value Date/Time   CALCIUM 10.1 05/22/2015 0836   CALCIUM 9.9 03/06/2015 0518   CALCIUM 10.3 10/04/2014 1357   ALKPHOS 148 05/22/2015 0836   ALKPHOS 93 03/06/2015 0518   ALKPHOS 146* 10/04/2014 1357   AST 15 05/22/2015 0836   AST 14* 03/06/2015 0518   AST 25 10/04/2014 1357   ALT 9 05/22/2015 0836   ALT 12* 03/06/2015 0518   ALT 19 10/04/2014 1357   BILITOT 0.45 05/22/2015 0836   BILITOT 0.5 03/06/2015 0518   BILITOT 0.90 10/04/2014 1357         Impression and Plan: [name] is 45 year old female. She has a myeloproliferative syndrome. So far, our workup has been totally negative.  Given that her white cell count is still holding steady, I think we can probably move her interferon injections to once every 4 weeks. She is okay with this.  She is at a high risk for thromboembolic disease. If she does have a thrombus in her left leg, and she will need long-term anticoagulation.  She is okay with moving her interferon injections to once a month.   I think we can probably get her back in another 8 weeks.   I spent about 30 minutes with her today.  I tried to reassure her that I did not think that she had a blood clot in her left leg.   Brianna Napoleon, MD 4/13/201711:29 AM

## 2015-07-03 NOTE — Progress Notes (Signed)
Patient ID: Brianna Fletcher, female    DOB: [birth year]  Age: 45 y.o. MRN: [id number]    Subjective:  Subjective HPI Brianna Fletcher presents for c/o L calf pain. It started with a bruise like spot on lat calf that was itchy and than started to become painful..  It had turned red for several days and warm.  It is better today.    Review of Systems  Constitutional: Negative for diaphoresis, appetite change, fatigue and unexpected weight change.  Eyes: Negative for pain, redness and visual disturbance.  Respiratory: Negative for cough, chest tightness, shortness of breath and wheezing.   Cardiovascular: Negative for chest pain, palpitations and leg swelling.  Endocrine: Negative for cold intolerance, heat intolerance, polydipsia, polyphagia and polyuria.  Genitourinary: Negative for dysuria, frequency and difficulty urinating.  Neurological: Negative for dizziness, light-headedness, numbness and headaches.    History Past Medical History  Diagnosis Date  . Glaucoma   . Arthritis   . Leukocytosis, unspecified 11/15/2012  . Chronic neutrophilia 11/15/2012  . Pruritus 11/15/2012  . Monocytosis 11/15/2012  . Myeloproliferative disorder ([place]) 03/08/2013  . Precordial chest pain   . Family history of heart disease     mother had stents placed in her 51s and had myocardial infarctions  . Radial artery occlusion, right ([facility])     status post  right radial artery cardiac catheterization  . Blood dyscrasia     produces too many WBCs  . Vaginal delivery 1986, 2000  . Hypertension     She has past surgical history that includes Tubal ligation (2000); Eye surgery; Cardiac catheterization (N/A, 10/03/2014); Dilatation & currettage/hysteroscopy with novasure ablation (N/A, 11/08/2014); and Laparoscopic vaginal hysterectomy with salpingectomy (Bilateral, 03/05/2015).   Her family history includes Cancer in her father; Dementia in her maternal grandmother; Diabetes in her father; Heart disease in her  cousin, maternal grandmother, and mother; Heart disease (age of onset: 51) in her maternal grandfather; Hyperlipidemia in her mother; Hypertension in her mother. There is no history of Other.She reports that she has quit smoking. Her smoking use included Cigarettes. She started smoking about 10 months ago. She has never used smokeless tobacco. She reports that she drinks about 3.6 oz of alcohol per week. She reports that she does not use illicit drugs.  Current Outpatient Prescriptions on File Prior to Visit  Medication Sig Dispense Refill  . amLODipine (NORVASC) 5 MG tablet Take 1 tablet (5 mg total) by mouth daily. 90 tablet 3  . aspirin 81 MG tablet Take 81 mg by mouth daily.    Marland Kitchen doxepin (SINEQUAN) 50 MG capsule TAKE 1 CAPSULE BY MOUTH AT BEDTIME 30 capsule 3  . levocetirizine (XYZAL) 5 MG tablet Take 1 tablet (5 mg total) by mouth every evening. 30 tablet 5  . omeprazole (PRILOSEC) 20 MG capsule TAKE 1 CAPSULE (20 MG TOTAL) BY MOUTH DAILY. 30 capsule 11  . PEGASYS 180 MCG/ML injection Inject 0.45 mLs (81 mcg total) into the skin every 21 ( twenty-one) days. 2 mL 3   Current Facility-Administered Medications on File Prior to Visit  Medication Dose Route Frequency Provider Last Rate Last Dose  . acetaminophen (TYLENOL) tablet 650 mg  650 mg Oral Once Brianna Napoleon, MD   650 mg at 04/26/14 1516  . peginterferon alfa-2a (PEGASYS) injection 81 mcg  81 mcg Subcutaneous Weekly Brianna Napoleon, MD   81 mcg at 04/26/14 1530     Objective:  Objective Physical Exam  Constitutional: She is oriented to person,  place, and time. She appears well-developed and well-nourished.  HENT:  Head: Normocephalic and atraumatic.  Eyes: Conjunctivae and EOM are normal.  Neck: Normal range of motion. Neck supple. No JVD present. Carotid bruit is not present. No thyromegaly present.  Cardiovascular: Normal rate, regular rhythm and normal heart sounds.   No murmur heard. Pulmonary/Chest: Effort normal and breath  sounds normal. No respiratory distress. She has no wheezes. She has no rales. She exhibits no tenderness.  Musculoskeletal: She exhibits no edema.  Neurological: She is alert and oriented to person, place, and time.  Psychiatric: She has a normal mood and affect. Her behavior is normal.  Nursing note and vitals reviewed.  BP 131/81 mmHg  Pulse 65  Temp(Src) 98.8 F (37.1 C) (Oral)  Ht 5\' 7"  (1.702 m)  Wt 237 lb 12.8 oz (107.865 kg)  BMI 37.24 kg/m2  SpO2 98% Wt Readings from Last 3 Encounters:  07/03/15 237 lb (107.502 kg)  07/03/15 237 lb 12.8 oz (107.865 kg)  05/22/15 231 lb (104.781 kg)     Lab Results  Component Value Date   WBC 15.9* 07/03/2015   HGB 13.2 07/03/2015   HCT 39.1 07/03/2015   PLT 252 07/03/2015   GLUCOSE 88 07/03/2015   CHOL 146 10/15/2014   TRIG 102.0 10/15/2014   HDL 40.70 10/15/2014   LDLCALC 85 10/15/2014   ALT <9 07/03/2015   AST 14 07/03/2015   NA 137 07/03/2015   K 4.4 07/03/2015   CL 104 03/06/2015   CREATININE 0.7 07/03/2015   BUN 11.7 07/03/2015   CO2 21* 07/03/2015   TSH 2.91 10/15/2014   INR 0.91 10/01/2014    Mm Screening Breast Tomo Bilateral  03/05/2015  CLINICAL DATA:  Screening. EXAM: DIGITAL SCREENING BILATERAL MAMMOGRAM WITH 3D TOMO WITH CAD COMPARISON:  Previous exam(s). ACR Breast Density Category c: The breast tissue is heterogeneously dense, which may obscure small masses. FINDINGS: There are no findings suspicious for malignancy. Images were processed with CAD. IMPRESSION: No mammographic evidence of malignancy. A result letter of this screening mammogram will be mailed directly to the patient. RECOMMENDATION: Screening mammogram in one year. (Code:SM-B-01Y) BI-RADS CATEGORY  1: Negative. Electronically Signed   By: Brianna Fletcher M.D.   On: 03/05/2015 10:01     Assessment & Plan:  Plan I have discontinued Brianna Fletcher's lubiprostone. I am also having her maintain her aspirin, amLODipine, omeprazole, doxepin, levocetirizine,  PEGASYS, and LINZESS.  Meds ordered this encounter  Medications  . LINZESS 290 MCG CAPS capsule    Sig: Take 1 tablet by mouth daily. Has not started yet    Refill:  2    Problem List Items Addressed This Visit    None    Visit Diagnoses    Calf pain, left    -  Primary    Relevant Orders    US Venous Img Lower Unilateral Left (Completed)       Follow-up: Return if symptoms worsen or fail to improve.  Ann Held, DO

## 2015-07-03 NOTE — Progress Notes (Signed)
Pre visit review using our clinic review tool, if applicable. No additional management support is needed unless otherwise documented below in the visit note. 

## 2015-07-03 NOTE — Patient Instructions (Signed)
Phlebitis  Phlebitis is soreness and swelling (inflammation) of a vein. This can occur in your arms, legs, or torso (trunk), as well as deeper inside your body. Phlebitis is usually not serious when it occurs close to the surface of the body. However, it can cause serious problems when it occurs in a vein deeper inside the body.  CAUSES   Phlebitis can be triggered by various things, including:    Reduced blood flow through your veins. This can happen with:    Bed rest over a long period.    Long-distance travel.    Injury.    Surgery.    Being overweight (obese) or pregnant.   Having an IV tube put in the vein and getting certain medicines through the vein.   Cancer and cancer treatment.   Use of illegal drugs taken through the vein.   Inflammatory diseases.   Inherited (genetic) diseases that increase the risk of blood clots.   Hormone therapy, such as birth control pills.  SIGNS AND SYMPTOMS    Red, tender, swollen, and painful area on your skin. Usually, the area will be long and narrow.   Firmness along the center of the affected area. This can indicate that a blood clot has formed.   Low-grade fever.  DIAGNOSIS   A health care provider can usually diagnose phlebitis by examining the affected area and asking about your symptoms. To check for infection or blood clots, your health care provider may order blood tests or an ultrasound exam of the area. Blood tests and your family history may also indicate if you have an underlying genetic disease that causes blood clots. Occasionally, a piece of tissue is taken from the body (biopsy sample) if an unusual cause of phlebitis is suspected.  TREATMENT   Treatment will vary depending on the severity of the condition and the area of the body affected. Treatment may include:   Use of a warm compress or heating pad.   Use of compression stockings or bandages.   Anti-inflammatory medicines.   Removal of any IV tube that may be causing the problem.   Medicines  that kill germs (antibiotics) if an infection is present.   Blood-thinning medicines if a blood clot is suspected or present.   In rare cases, surgery may be needed to remove damaged sections of vein.  HOME CARE INSTRUCTIONS    Only take over-the-counter or prescription medicines as directed by your health care provider. Take all medicines exactly as prescribed.   Raise (elevate) the affected area above the level of your heart as directed by your health care provider.   Apply a warm compress or heating pad to the affected area as directed by your health care provider. Do not sleep with the heating pad.   Use compression stockings or bandages as directed. These will speed healing and prevent the condition from coming back.   If you are on blood thinners:    Get follow-up blood tests as directed by your health care provider.    Check with your health care provider before using any new medicines.    Carry a medical alert card or wear your medical alert jewelry to show that you are on blood thinners.   For phlebitis in the legs:    Avoid prolonged standing or bed rest.    Keep your legs moving. Raise your legs when sitting or lying.   Do not smoke.   Women, particularly those over the age of 35, should consider   the risks and benefits of taking the contraceptive pill. This kind of hormone treatment can increase your risk for blood clots.   Follow up with your health care provider as directed.  SEEK MEDICAL CARE IF:    You have unusual bruising or any bleeding problems.   Your swelling or pain in the affected area is not improving.   You are on anti-inflammatory medicine, and you develop belly (abdominal) pain.  SEEK IMMEDIATE MEDICAL CARE IF:    You have a sudden onset of chest pain or difficulty breathing.   You have a fever or persistent symptoms for more than 2-3 days.   You have a fever and your symptoms suddenly get worse.  MAKE SURE YOU:   Understand these instructions.   Will watch your  condition.   Will get help right away if you are not doing well or get worse.     This information is not intended to replace advice given to you by your health care provider. Make sure you discuss any questions you have with your health care provider.     Document Released: 03/02/2001 Document Revised: 12/27/2012 Document Reviewed: 11/13/2012  [employer] Interactive Patient Education 2016 [employer]

## 2015-07-04 LAB — RETICULOCYTES: RETICULOCYTE COUNT: 1.3 % (ref 0.6–2.6)

## 2015-09-04 ENCOUNTER — Other Ambulatory Visit (HOSPITAL_BASED_OUTPATIENT_CLINIC_OR_DEPARTMENT_OTHER): Payer: Medicaid Other

## 2015-09-04 ENCOUNTER — Encounter: Payer: Self-pay | Admitting: Family

## 2015-09-04 ENCOUNTER — Other Ambulatory Visit: Payer: Self-pay | Admitting: Nurse Practitioner

## 2015-09-04 ENCOUNTER — Ambulatory Visit (HOSPITAL_BASED_OUTPATIENT_CLINIC_OR_DEPARTMENT_OTHER): Payer: Medicaid Other | Admitting: Family

## 2015-09-04 VITALS — BP 126/60 | HR 60 | Temp 97.6°F | Resp 16 | Ht 67.0 in | Wt 231.0 lb

## 2015-09-04 DIAGNOSIS — R0602 Shortness of breath: Secondary | ICD-10-CM

## 2015-09-04 DIAGNOSIS — D471 Chronic myeloproliferative disease: Secondary | ICD-10-CM

## 2015-09-04 DIAGNOSIS — M79606 Pain in leg, unspecified: Secondary | ICD-10-CM

## 2015-09-04 DIAGNOSIS — E038 Other specified hypothyroidism: Secondary | ICD-10-CM

## 2015-09-04 DIAGNOSIS — D72828 Other elevated white blood cell count: Secondary | ICD-10-CM

## 2015-09-04 DIAGNOSIS — R5382 Chronic fatigue, unspecified: Secondary | ICD-10-CM

## 2015-09-04 DIAGNOSIS — M545 Low back pain: Secondary | ICD-10-CM | POA: Diagnosis not present

## 2015-09-04 DIAGNOSIS — D509 Iron deficiency anemia, unspecified: Secondary | ICD-10-CM

## 2015-09-04 LAB — CBC WITH DIFFERENTIAL (CANCER CENTER ONLY)
BASO#: 0 10*3/uL (ref 0.0–0.2)
BASO%: 0.1 % (ref 0.0–2.0)
EOS%: 0.6 % (ref 0.0–7.0)
Eosinophils Absolute: 0.1 10*3/uL (ref 0.0–0.5)
HEMATOCRIT: 43.4 % (ref 34.8–46.6)
HEMOGLOBIN: 14.6 g/dL (ref 11.6–15.9)
LYMPH#: 2.7 10*3/uL (ref 0.9–3.3)
LYMPH%: 16.8 % (ref 14.0–48.0)
MCH: 31.1 pg (ref 26.0–34.0)
MCHC: 33.6 g/dL (ref 32.0–36.0)
MCV: 92 fL (ref 81–101)
MONO#: 0.9 10*3/uL (ref 0.1–0.9)
MONO%: 5.3 % (ref 0.0–13.0)
NEUT%: 77.2 % (ref 39.6–80.0)
NEUTROS ABS: 12.3 10*3/uL — AB (ref 1.5–6.5)
Platelets: 238 10*3/uL (ref 145–400)
RBC: 4.7 10*6/uL (ref 3.70–5.32)
RDW: 12.9 % (ref 11.1–15.7)
WBC: 16 10*3/uL — AB (ref 3.9–10.0)

## 2015-09-04 LAB — IRON AND TIBC
%SAT: 26 % (ref 21–57)
Iron: 81 ug/dL (ref 41–142)
TIBC: 310 ug/dL (ref 236–444)
UIBC: 229 ug/dL (ref 120–384)

## 2015-09-04 LAB — TECHNOLOGIST REVIEW CHCC SATELLITE

## 2015-09-04 LAB — CMP (CANCER CENTER ONLY)
ALK PHOS: 114 U/L — AB (ref 26–84)
ALT: 15 U/L (ref 10–47)
AST: 21 U/L (ref 11–38)
Albumin: 4.1 g/dL (ref 3.3–5.5)
BUN: 14 mg/dL (ref 7–22)
CHLORIDE: 104 meq/L (ref 98–108)
CO2: 28 mEq/L (ref 18–33)
CREATININE: 0.8 mg/dL (ref 0.6–1.2)
Calcium: 10.7 mg/dL — ABNORMAL HIGH (ref 8.0–10.3)
GLUCOSE: 92 mg/dL (ref 73–118)
POTASSIUM: 3.9 meq/L (ref 3.3–4.7)
Sodium: 137 mEq/L (ref 128–145)
TOTAL PROTEIN: 8.1 g/dL (ref 6.4–8.1)
Total Bilirubin: 0.8 mg/dl (ref 0.20–1.60)

## 2015-09-04 LAB — FERRITIN: Ferritin: 78 ng/ml (ref 9–269)

## 2015-09-04 LAB — TSH: TSH: 1.469 m[IU]/L (ref 0.308–3.960)

## 2015-09-04 LAB — CHCC SATELLITE - SMEAR

## 2015-09-04 MED ORDER — PEGASYS 180 MCG/ML ~~LOC~~ SOLN: 81.0000 ug | SUBCUTANEOUS | Status: DC

## 2015-09-04 NOTE — Progress Notes (Signed)
Hematology and Oncology Follow Up Visit  Brianna Fletcher:[id number] January 13, 1971 45 y.o. 09/04/2015   Principle Diagnosis:  Chronic myeloproliferative syndrome - Triple negative  Current Therapy:   Peg-Interferon every 4 week dosing    Interim History:  [name] is here today for a follow-up. She is not feeling well today. She states that she has been extremely fatigued for the last week and has intermittent SOB with exertion. She is having lower back and leg aches as well. Her Pegasys injections were moved to every 4 weeks at her last visit.  She is also under a great deal of stress being out of work.  No fever, chills, n/v, cough, rash, dizziness, chest pain, palpitations, abdominal pain or change in bowel or bladder habits. No lymphadenopathy found on exam. No episodes of bleeding or bruising.  No swelling, numbness or tingling in her extremities.  She is still eating healthy and staying well hydrated. Her weight is   Medications:    Medication List       This list is accurate as of: 09/04/15 10:07 AM.  Always use your most recent med list.               amLODipine 5 MG tablet  Commonly known as:  NORVASC  Take 1 tablet (5 mg total) by mouth daily.     aspirin 81 MG tablet  Take 81 mg by mouth daily.     doxepin 50 MG capsule  Commonly known as:  SINEQUAN  TAKE 1 CAPSULE BY MOUTH AT BEDTIME     omeprazole 20 MG capsule  Commonly known as:  PRILOSEC  TAKE 1 CAPSULE (20 MG TOTAL) BY MOUTH DAILY.     PEGASYS 180 MCG/ML injection  Generic drug:  peginterferon alfa-2a  Inject 0.45 mLs (81 mcg total) into the skin every 21 ( twenty-one) days.        Allergies:  Allergies  Allergen Reactions  . Amitiza [Lubiprostone]     Made her sick  . Lisinopril Cough  . Losartan Cough    Past Medical History, Surgical history, Social history, and Family History were reviewed and updated.  Review of Systems: All other 10 point review of systems is negative.   Physical  Exam:  height is 5\' 7"  (1.702 m) and weight is 231 lb (104.781 kg). Her oral temperature is 97.6 F (36.4 C). Her blood pressure is 126/60 and her pulse is 60. Her respiration is 16.   Wt Readings from Last 3 Encounters:  09/04/15 231 lb (104.781 kg)  07/03/15 237 lb (107.502 kg)  07/03/15 237 lb 12.8 oz (107.865 kg)    Ocular: Sclerae unicteric, pupils equal, round and reactive to light Ear-nose-throat: Oropharynx clear, dentition fair Lymphatic: No cervical supraclavicular or axillary adenopathy Lungs no rales or rhonchi, good excursion bilaterally Heart regular rate and rhythm, no murmur appreciated Abd soft, nontender, positive bowel sounds, no liver or spleen tip palpated on exam MSK no focal spinal tenderness, no joint edema Neuro: non-focal, well-oriented, appropriate affect Breasts: Deferred  Lab Results  Component Value Date   WBC 16.0* 09/04/2015   HGB 14.6 09/04/2015   HCT 43.4 09/04/2015   MCV 92 09/04/2015   PLT 238 09/04/2015   Lab Results  Component Value Date   FERRITIN 69 07/03/2015   IRON 67 07/03/2015   TIBC 319 07/03/2015   UIBC 251 07/03/2015   IRONPCTSAT 21 07/03/2015   Lab Results  Component Value Date   RETICCTPCT 5.3* 11/20/2013  RBC 4.70 09/04/2015   RETICCTABS 116.1 11/20/2013   No results found for: KPAFRELGTCHN, LAMBDASER, KAPLAMBRATIO No results found for: IGGSERUM, IGA, IGMSERUM No results found for: Odetta Pink, SPEI   Chemistry      Component Value Date/Time   NA 137 07/03/2015 1046   NA 137 03/06/2015 0518   NA 145 10/04/2014 1357   K 4.4 07/03/2015 1046   K 4.1 03/06/2015 0518   K 4.1 10/04/2014 1357   CL 104 03/06/2015 0518   CL 107 10/04/2014 1357   CO2 21* 07/03/2015 1046   CO2 26 03/06/2015 0518   CO2 26 10/04/2014 1357   BUN 11.7 07/03/2015 1046   BUN 10 03/06/2015 0518   BUN 11 10/04/2014 1357   CREATININE 0.7 07/03/2015 1046   CREATININE 0.63 03/06/2015 0518    CREATININE 0.69 01/08/2015 1716      Component Value Date/Time   CALCIUM 10.3 07/03/2015 1046   CALCIUM 9.9 03/06/2015 0518   CALCIUM 10.3 10/04/2014 1357   ALKPHOS 117 07/03/2015 1046   ALKPHOS 93 03/06/2015 0518   ALKPHOS 146* 10/04/2014 1357   AST 14 07/03/2015 1046   AST 14* 03/06/2015 0518   AST 25 10/04/2014 1357   ALT <9 07/03/2015 1046   ALT 12* 03/06/2015 0518   ALT 19 10/04/2014 1357   BILITOT 0.40 07/03/2015 1046   BILITOT 0.5 03/06/2015 0518   BILITOT 0.90 10/04/2014 1357     Impression and Plan: Brianna Fletcher is a 45 yo white female with a myeloproliferative syndrome. She is symptomatic with fatigue and SOB with exertion at this time. She is also having back and leg aches.  We will change her injections back to once every 2 weeks.  A TSH was added to her lab work. Results are pending.  We will plan to see her back in 1 month for repeat labs and to see if there has been any improvement.  She will contact our office with any questions or concerns. We can certainly see her sooner if need be.   Eliezer Bottom, NP 6/15/201710:07 AM

## 2015-09-05 LAB — RETICULOCYTES: Reticulocyte Count: 1.1 % (ref 0.6–2.6)

## 2015-09-22 ENCOUNTER — Telehealth: Payer: Self-pay | Admitting: Family

## 2015-09-22 NOTE — Telephone Encounter (Signed)
Spoke with Brianna Fletcher over the phone. She c/o joint aches and pains "all over" for the last week or two. She is still feeling fatigued. We will have her come in on Thursday for repeat lab work to assess for any changes. She is now back on Pegasys injection once every 2 weeks. She will also taking Motrin in moderation and see if this gives her any pain relief.

## 2015-09-25 ENCOUNTER — Other Ambulatory Visit: Payer: Self-pay | Admitting: Family

## 2015-09-25 ENCOUNTER — Other Ambulatory Visit (HOSPITAL_BASED_OUTPATIENT_CLINIC_OR_DEPARTMENT_OTHER): Payer: Medicaid Other

## 2015-09-25 DIAGNOSIS — E038 Other specified hypothyroidism: Secondary | ICD-10-CM

## 2015-09-25 DIAGNOSIS — D509 Iron deficiency anemia, unspecified: Secondary | ICD-10-CM | POA: Diagnosis not present

## 2015-09-25 DIAGNOSIS — D471 Chronic myeloproliferative disease: Secondary | ICD-10-CM

## 2015-09-25 DIAGNOSIS — D72828 Other elevated white blood cell count: Secondary | ICD-10-CM

## 2015-09-25 DIAGNOSIS — R5382 Chronic fatigue, unspecified: Secondary | ICD-10-CM

## 2015-09-25 DIAGNOSIS — M2550 Pain in unspecified joint: Secondary | ICD-10-CM

## 2015-09-25 LAB — CBC WITH DIFFERENTIAL (CANCER CENTER ONLY)
BASO#: 0 10*3/uL (ref 0.0–0.2)
BASO%: 0.2 % (ref 0.0–2.0)
EOS%: 0.6 % (ref 0.0–7.0)
Eosinophils Absolute: 0.1 10*3/uL (ref 0.0–0.5)
HCT: 38.4 % (ref 34.8–46.6)
HGB: 12.8 g/dL (ref 11.6–15.9)
LYMPH#: 2.8 10*3/uL (ref 0.9–3.3)
LYMPH%: 17.2 % (ref 14.0–48.0)
MCH: 31.1 pg (ref 26.0–34.0)
MCHC: 33.3 g/dL (ref 32.0–36.0)
MCV: 93 fL (ref 81–101)
MONO#: 0.9 10*3/uL (ref 0.1–0.9)
MONO%: 5.4 % (ref 0.0–13.0)
NEUT#: 12.5 10*3/uL — ABNORMAL HIGH (ref 1.5–6.5)
NEUT%: 76.6 % (ref 39.6–80.0)
PLATELETS: 220 10*3/uL (ref 145–400)
RBC: 4.11 10*6/uL (ref 3.70–5.32)
RDW: 12.8 % (ref 11.1–15.7)
WBC: 16.3 10*3/uL — AB (ref 3.9–10.0)

## 2015-09-25 LAB — CMP (CANCER CENTER ONLY)
ALBUMIN: 3.7 g/dL (ref 3.3–5.5)
ALK PHOS: 118 U/L — AB (ref 26–84)
ALT: 17 U/L (ref 10–47)
AST: 22 U/L (ref 11–38)
BILIRUBIN TOTAL: 0.6 mg/dL (ref 0.20–1.60)
BUN, Bld: 12 mg/dL (ref 7–22)
CALCIUM: 10.2 mg/dL (ref 8.0–10.3)
CO2: 26 meq/L (ref 18–33)
CREATININE: 0.6 mg/dL (ref 0.6–1.2)
Chloride: 102 mEq/L (ref 98–108)
Glucose, Bld: 109 mg/dL (ref 73–118)
Potassium: 3.7 mEq/L (ref 3.3–4.7)
SODIUM: 134 meq/L (ref 128–145)
TOTAL PROTEIN: 7.2 g/dL (ref 6.4–8.1)

## 2015-09-25 MED ORDER — TRAMADOL HCL 50 MG PO TABS: 50.0000 mg | ORAL_TABLET | Freq: Four times a day (QID) | ORAL | Status: DC | PRN

## 2015-09-25 MED FILL — traMADol HCL 50 MG TABS: 50 | 30 days supply | Qty: 120 | Fill #0

## 2015-09-25 NOTE — Progress Notes (Signed)
Patient here today for labs. CBC and CMP look good. We will have her try Ultram and see if this helps with her pain. I spoke with our pharmacist Lattie Haw about the fact that she is also on Doxepin and it was felt that since she has no history of seizures she would be fine to try this. She will also have her PCP refer her to Dr. Berna Bue with rheumatology.

## 2015-09-26 ENCOUNTER — Telehealth: Payer: Self-pay | Admitting: Family Medicine

## 2015-09-26 ENCOUNTER — Other Ambulatory Visit: Payer: Self-pay | Admitting: Lab

## 2015-09-26 DIAGNOSIS — M2550 Pain in unspecified joint: Secondary | ICD-10-CM

## 2015-09-26 LAB — IRON AND TIBC
%SAT: 23 % (ref 21–57)
IRON: 67 ug/dL (ref 41–142)
TIBC: 295 ug/dL (ref 236–444)
UIBC: 228 ug/dL (ref 120–384)

## 2015-09-26 LAB — FERRITIN: Ferritin: 91 ng/ml (ref 9–269)

## 2015-09-26 NOTE — Telephone Encounter (Signed)
Pt called in. She says that she was advised by a different provider to have a referral to Rheumatology. Preferred Berna Bue on [address].

## 2015-09-26 NOTE — Telephone Encounter (Signed)
Spoke with patient called and said Dr.Ennever advised for her to see a Rheumatologist for joint pain and fatigue. Please advise    KP

## 2015-09-27 ENCOUNTER — Other Ambulatory Visit: Payer: Self-pay | Admitting: Family Medicine

## 2015-09-27 DIAGNOSIS — M25569 Pain in unspecified knee: Secondary | ICD-10-CM

## 2015-09-27 DIAGNOSIS — M25559 Pain in unspecified hip: Secondary | ICD-10-CM

## 2015-09-27 LAB — SEDIMENTATION RATE: Sedimentation Rate-Westergren: 24 mm/hr (ref 0–32)

## 2015-09-27 NOTE — Telephone Encounter (Signed)
Referral placed.

## 2015-10-03 ENCOUNTER — Other Ambulatory Visit: Payer: Self-pay | Admitting: Family

## 2015-10-03 DIAGNOSIS — M2550 Pain in unspecified joint: Secondary | ICD-10-CM

## 2015-10-03 DIAGNOSIS — M2540 Effusion, unspecified joint: Secondary | ICD-10-CM

## 2015-10-09 ENCOUNTER — Telehealth: Payer: Self-pay

## 2015-10-09 NOTE — Telephone Encounter (Signed)
Received fax notification from Dr Gavin Pound stating that cannot accept patient because they do not accept Medicaid. Pt notified by phone. Pt has a name of another rheumatologist in [place] and is planning to contact their office today to see if they accept Medicaid and if a referral is required. When pt locates an office that takes her insurance, a referral will be made if required. dph

## 2015-10-13 ENCOUNTER — Telehealth: Payer: Self-pay | Admitting: *Deleted

## 2015-10-14 ENCOUNTER — Encounter: Payer: Self-pay | Admitting: *Deleted

## 2015-10-14 NOTE — Progress Notes (Signed)
Referral faxed to Rheumatology at [facility] with Dr. Abner Greenspan. Per patient request

## 2015-10-15 ENCOUNTER — Ambulatory Visit: Payer: Medicaid Other | Admitting: Hematology & Oncology

## 2015-10-15 ENCOUNTER — Other Ambulatory Visit: Payer: Medicaid Other

## 2015-10-22 ENCOUNTER — Other Ambulatory Visit: Payer: Self-pay | Admitting: Nurse Practitioner

## 2015-10-22 DIAGNOSIS — D72828 Other elevated white blood cell count: Secondary | ICD-10-CM

## 2015-10-22 DIAGNOSIS — D471 Chronic myeloproliferative disease: Secondary | ICD-10-CM

## 2015-10-23 ENCOUNTER — Ambulatory Visit (HOSPITAL_BASED_OUTPATIENT_CLINIC_OR_DEPARTMENT_OTHER): Payer: Medicaid Other | Admitting: Hematology & Oncology

## 2015-10-23 ENCOUNTER — Other Ambulatory Visit (HOSPITAL_BASED_OUTPATIENT_CLINIC_OR_DEPARTMENT_OTHER): Payer: Medicaid Other

## 2015-10-23 VITALS — BP 136/73 | HR 64 | Temp 97.7°F | Wt 239.0 lb

## 2015-10-23 DIAGNOSIS — D72828 Other elevated white blood cell count: Secondary | ICD-10-CM

## 2015-10-23 DIAGNOSIS — M25541 Pain in joints of right hand: Secondary | ICD-10-CM

## 2015-10-23 DIAGNOSIS — D471 Chronic myeloproliferative disease: Secondary | ICD-10-CM | POA: Diagnosis not present

## 2015-10-23 LAB — COMPREHENSIVE METABOLIC PANEL
ALT: 11 U/L (ref 0–55)
ANION GAP: 11 meq/L (ref 3–11)
AST: 14 U/L (ref 5–34)
Albumin: 3.7 g/dL (ref 3.5–5.0)
Alkaline Phosphatase: 131 U/L (ref 40–150)
BUN: 12.8 mg/dL (ref 7.0–26.0)
CHLORIDE: 105 meq/L (ref 98–109)
CO2: 21 meq/L — AB (ref 22–29)
CREATININE: 0.8 mg/dL (ref 0.6–1.1)
Calcium: 10.4 mg/dL (ref 8.4–10.4)
EGFR: >90 mL/min/{1.73_m2} (ref >90)
GLUCOSE: 95 mg/dL (ref 70–140)
Potassium: 4.1 mEq/L (ref 3.5–5.1)
SODIUM: 137 meq/L (ref 136–145)
Total Bilirubin: 0.36 mg/dL (ref 0.20–1.20)
Total Protein: 7.8 g/dL (ref 6.4–8.3)

## 2015-10-23 LAB — CBC WITH DIFFERENTIAL (CANCER CENTER ONLY)
BASO#: 0 10*3/uL (ref 0.0–0.2)
BASO%: 0.1 % (ref 0.0–2.0)
EOS%: 0.4 % (ref 0.0–7.0)
Eosinophils Absolute: 0.1 10*3/uL (ref 0.0–0.5)
HCT: 38.9 % (ref 34.8–46.6)
HGB: 13 g/dL (ref 11.6–15.9)
LYMPH#: 2.6 10*3/uL (ref 0.9–3.3)
LYMPH%: 15.6 % (ref 14.0–48.0)
MCH: 31.3 pg (ref 26.0–34.0)
MCHC: 33.4 g/dL (ref 32.0–36.0)
MCV: 94 fL (ref 81–101)
MONO#: 1 10*3/uL — ABNORMAL HIGH (ref 0.1–0.9)
MONO%: 6.1 % (ref 0.0–13.0)
NEUT#: 13 10*3/uL — ABNORMAL HIGH (ref 1.5–6.5)
NEUT%: 77.8 % (ref 39.6–80.0)
PLATELETS: 247 10*3/uL (ref 145–400)
RBC: 4.15 10*6/uL (ref 3.70–5.32)
RDW: 13.7 % (ref 11.1–15.7)
WBC: 16.7 10*3/uL — AB (ref 3.9–10.0)

## 2015-10-23 NOTE — Progress Notes (Signed)
Hematology and Oncology Follow Up Visit  Brianna Fletcher [id number] Jan 12, 1971 45 y.o. 10/23/2015   Principle Diagnosis:  Chronic myeloproliferative syndrome-Triple negative  Current Therapy:   Peg-Interferon q weekly - q 3 week dosing.     Interim History:  [name] is back for followup. She is having a tough time right now. She has mostly affected by arthralgias in her hands. We try to get her set up with a rheumatologist but it isn't hard for her to see rheumatology since most some do not take Medicaid.  We temporarily increased her interferon treatments every 2 weeks. I think we probably move him back to every 3 weeks.   She has had no fever. She has had no rashes. There's been no change in bowel or bladder habits.   She has had no cough. [name] had no weight loss or weight gain.   She is not able to work because her hands, mostly her right hand, are stiff. I'm not sure of this might be from the interferon.   [name] had no diarrhea.   Overall, her performance status is ECOG 1.  Medications:  Current Outpatient Prescriptions:  .  amLODipine (NORVASC) 5 MG tablet, Take 1 tablet (5 mg total) by mouth daily., Disp: 90 tablet, Rfl: 3 .  aspirin 81 MG tablet, Take 81 mg by mouth daily., Disp: , Rfl:  .  doxepin (SINEQUAN) 50 MG capsule, TAKE 1 CAPSULE BY MOUTH AT BEDTIME, Disp: 30 capsule, Rfl: 3 .  omeprazole (PRILOSEC) 20 MG capsule, TAKE 1 CAPSULE (20 MG TOTAL) BY MOUTH DAILY., Disp: 30 capsule, Rfl: 11 .  PEGASYS 180 MCG/ML injection, Inject 0.45 mLs (81 mcg total) into the skin every 14 (fourteen) days., Disp: 2 mL, Rfl: 3 No current facility-administered medications for this visit.   Facility-Administered Medications Ordered in Other Visits:  .  acetaminophen (TYLENOL) tablet 650 mg, 650 mg, Oral, Once, [name], MD .  peginterferon alfa-2a (PEGASYS) injection 81 mcg, 81 mcg, Subcutaneous, Weekly, [name], MD, 81 mcg at 04/26/14 1530  Allergies:  Allergies    Allergen Reactions  . Amitiza [Lubiprostone]     Made her sick  . Lisinopril Cough  . Losartan Cough    Past Medical History, Surgical history, Social history, and Family History were reviewed and updated.  Review of Systems: As above  Physical Exam:  weight is 239 lb (108.4 kg). Her oral temperature is 97.7 F (36.5 C). Her blood pressure is 136/73 and her pulse is 64.   Well-developed and well-nourished white female. Head and neck exam shows no ocular or oral lesions. She has no palpable cervical or supraclavicular lymph nodes. Lungs are clear. Cardiac exam regular rate and rhythm with no murmurs rubs or bruits. Abdomen is soft. She has good bowel sounds. She has healing Brosky be scars. There is no fluid wave. There is no palpable liver or spleen tip. Back exam shows no tenderness over the spine or ribs. There is some tenderness over the right hip area. This is chronic. She has some slight decreased range of motion of the right hip. Extremities shows no clubbing, cyanosis or edema. She has minimal edema in the left lower leg. No obvious venous cord is palpable. She has a negative Homans's sign in the left leg. She has good pulses in the distal arteries. Right leg is relatively unremarkable. Skin exam no rashes, ecchymosis or petechia. Her skin is a little dry. Neurological exam shows no focal neurological deficits.  Lab Results  Component Value Date   WBC 16.7 (H) 10/23/2015   HGB 13.0 10/23/2015   HCT 38.9 10/23/2015   MCV 94 10/23/2015   PLT 247 10/23/2015     Chemistry      Component Value Date/Time   NA 137 10/23/2015 1203   K 4.1 10/23/2015 1203   CL 102 09/25/2015 1431   CO2 21 (L) 10/23/2015 1203   BUN 12.8 10/23/2015 1203   CREATININE 0.8 10/23/2015 1203      Component Value Date/Time   CALCIUM 10.4 10/23/2015 1203   ALKPHOS 131 10/23/2015 1203   AST 14 10/23/2015 1203   ALT 11 10/23/2015 1203   BILITOT 0.36 10/23/2015 1203         Impression and  Plan: Brianna Fletcher is 45 year old female. She has a myeloproliferative syndrome. So far, our workup has been totally negative.  It is still unclear as to why she has the issues with the hands. Again it might be from the interferon. I guess the only way we would know is to stop the interferon. I do not think she is willing to do that right now.  Hopefully, we can get a rheumatologist to see her.  Again, we will move her interferon out to every 3 weeks.  I will plan to see her back in about 6 weeks.   [name], MD 8/3/20171:21 PM

## 2015-10-24 LAB — FERRITIN: FERRITIN: 77 ng/mL (ref 9–269)

## 2015-10-24 LAB — IRON AND TIBC
%SAT: 22 % (ref 21–57)
Iron: 74 ug/dL (ref 41–142)
TIBC: 335 ug/dL (ref 236–444)
UIBC: 261 ug/dL (ref 120–384)

## 2015-11-05 ENCOUNTER — Telehealth: Payer: Self-pay | Admitting: Hematology & Oncology

## 2015-11-05 NOTE — Telephone Encounter (Signed)
Patient called wanting Dr. Marin Olp to contact Dr. Florene Glen at [phone]. Patient stated that Dr. Abner Greenspan will accept Medicaid, but must consult with Dr. Marin Olp first. Will present this to Dr. Marin Olp.       [facility].

## 2015-11-17 ENCOUNTER — Other Ambulatory Visit: Payer: Self-pay | Admitting: *Deleted

## 2015-11-17 DIAGNOSIS — L299 Pruritus, unspecified: Secondary | ICD-10-CM

## 2015-11-17 NOTE — Progress Notes (Signed)
Patient wants referral to dermatology for burning, itching painful rash.  Dr. Marin Olp referred to Dermatology Dr. Nena Polio

## 2015-11-18 ENCOUNTER — Encounter: Payer: Self-pay | Admitting: Family Medicine

## 2015-11-18 ENCOUNTER — Other Ambulatory Visit: Payer: Self-pay | Admitting: *Deleted

## 2015-11-18 DIAGNOSIS — L299 Pruritus, unspecified: Secondary | ICD-10-CM

## 2015-12-04 ENCOUNTER — Other Ambulatory Visit (HOSPITAL_BASED_OUTPATIENT_CLINIC_OR_DEPARTMENT_OTHER): Payer: Medicaid Other

## 2015-12-04 ENCOUNTER — Encounter: Payer: Self-pay | Admitting: Hematology & Oncology

## 2015-12-04 ENCOUNTER — Ambulatory Visit (HOSPITAL_BASED_OUTPATIENT_CLINIC_OR_DEPARTMENT_OTHER): Payer: Medicaid Other | Admitting: Hematology & Oncology

## 2015-12-04 VITALS — BP 131/59 | HR 67 | Temp 97.3°F | Resp 18 | Ht 67.0 in | Wt 239.0 lb

## 2015-12-04 DIAGNOSIS — D471 Chronic myeloproliferative disease: Secondary | ICD-10-CM | POA: Diagnosis not present

## 2015-12-04 DIAGNOSIS — R5383 Other fatigue: Secondary | ICD-10-CM | POA: Diagnosis not present

## 2015-12-04 DIAGNOSIS — D508 Other iron deficiency anemias: Secondary | ICD-10-CM

## 2015-12-04 DIAGNOSIS — M25542 Pain in joints of left hand: Secondary | ICD-10-CM

## 2015-12-04 DIAGNOSIS — M25541 Pain in joints of right hand: Secondary | ICD-10-CM

## 2015-12-04 LAB — COMPREHENSIVE METABOLIC PANEL
ALBUMIN: 3.7 g/dL (ref 3.5–5.0)
ALT: 12 U/L (ref 0–55)
ANION GAP: 12 meq/L — AB (ref 3–11)
AST: 9 U/L (ref 5–34)
Alkaline Phosphatase: 114 U/L (ref 40–150)
BILIRUBIN TOTAL: 0.41 mg/dL (ref 0.20–1.20)
BUN: 19.8 mg/dL (ref 7.0–26.0)
CO2: 21 meq/L — AB (ref 22–29)
CREATININE: 0.8 mg/dL (ref 0.6–1.1)
Calcium: 10.8 mg/dL — ABNORMAL HIGH (ref 8.4–10.4)
Chloride: 104 mEq/L (ref 98–109)
EGFR: 84 mL/min/{1.73_m2} — AB (ref >90)
GLUCOSE: 151 mg/dL — AB (ref 70–140)
Potassium: 4.1 mEq/L (ref 3.5–5.1)
SODIUM: 136 meq/L (ref 136–145)
TOTAL PROTEIN: 7.7 g/dL (ref 6.4–8.3)

## 2015-12-04 LAB — CBC WITH DIFFERENTIAL (CANCER CENTER ONLY)
BASO#: 0 10*3/uL (ref 0.0–0.2)
BASO%: 0.1 % (ref 0.0–2.0)
EOS ABS: 0 10*3/uL (ref 0.0–0.5)
EOS%: 0.1 % (ref 0.0–7.0)
HEMATOCRIT: 43.1 % (ref 34.8–46.6)
HEMOGLOBIN: 14.5 g/dL (ref 11.6–15.9)
LYMPH#: 1.1 10*3/uL (ref 0.9–3.3)
LYMPH%: 5.7 % — ABNORMAL LOW (ref 14.0–48.0)
MCH: 31.5 pg (ref 26.0–34.0)
MCHC: 33.6 g/dL (ref 32.0–36.0)
MCV: 94 fL (ref 81–101)
MONO#: 0.5 10*3/uL (ref 0.1–0.9)
MONO%: 2.7 % (ref 0.0–13.0)
NEUT%: 91.4 % — AB (ref 39.6–80.0)
NEUTROS ABS: 17.8 10*3/uL — AB (ref 1.5–6.5)
Platelets: 260 10*3/uL (ref 145–400)
RBC: 4.6 10*6/uL (ref 3.70–5.32)
RDW: 13.2 % (ref 11.1–15.7)
WBC: 19.4 10*3/uL — ABNORMAL HIGH (ref 3.9–10.0)

## 2015-12-04 NOTE — Progress Notes (Signed)
Hematology and Oncology Follow Up Visit  Brianna Fletcher [id number] 1970/06/26 45 y.o. 12/04/2015   Principle Diagnosis:  Chronic myeloproliferative syndrome-Triple negative  Current Therapy:   Peg-Interferon q weekly - q 3 week dosing.     Interim History:  [name] is back for followup. She is having a tough time right now. She has mostly affected by arthralgias in her hands. She also had this rash recently. [name] been on a lot of prednisone. She sees a Paediatric nurse down in [place].  She was in the emergency room yesterday. She had problems with her right shoulder. X-rays were done. Nothing could be found. [name] given some meloxicam. This seemed to help.  Again, I have to believe that she has some kind of rheumatologic issue. Whether not is related to this myeloproliferative syndrome is not clear.  I suspect that her white cell count is probably higher. She is on prednisone.  I'm going to send off some serologies for rheumatologic and collagen vascular disorders.  Her appetite is okay. [name] had no nausea or vomiting. [name] had no change in bowel or bladder habits.  Thank you, this rash seems to be mostly gone. When she developed it, it was most of her body. I don't think he was on her face. It was macular in nature. There are no vesicles.  Overall, her performance status is ECOG 1.  Medications:  Current Outpatient Prescriptions:  .  amLODipine (NORVASC) 5 MG tablet, Take 1 tablet (5 mg total) by mouth daily., Disp: 90 tablet, Rfl: 3 .  aspirin 81 MG tablet, Take 81 mg by mouth daily., Disp: , Rfl:  .  cetirizine (ZYRTEC) 10 MG tablet, TK 1 T PO QD, Disp: , Rfl: 0 .  doxepin (SINEQUAN) 25 MG capsule, Take 25 mg by mouth., Disp: , Rfl:  .  omeprazole (PRILOSEC) 20 MG capsule, TAKE 1 CAPSULE (20 MG TOTAL) BY MOUTH DAILY., Disp: 30 capsule, Rfl: 11 .  PEGASYS 180 MCG/ML injection, Inject 0.45 mLs (81 mcg total) into the skin every 14 (fourteen) days., Disp: 2 mL, Rfl: 3 .   predniSONE (DELTASONE) 20 MG tablet, Take 20 mg by mouth daily with breakfast., Disp: , Rfl:  No current facility-administered medications for this visit.   Facility-Administered Medications Ordered in Other Visits:  .  acetaminophen (TYLENOL) tablet 650 mg, 650 mg, Oral, Once, Brianna Napoleon, MD .  peginterferon alfa-2a (PEGASYS) injection 81 mcg, 81 mcg, Subcutaneous, Weekly, Brianna Napoleon, MD, 81 mcg at 04/26/14 1530  Allergies:  Allergies  Allergen Reactions  . Amitiza [Lubiprostone]     Made her sick  . Lisinopril Cough  . Losartan Cough    Past Medical History, Surgical history, Social history, and Family History were reviewed and updated.  Review of Systems: As above  Physical Exam:  height is 5\' 7"  (1.702 m) and weight is 239 lb (108.4 kg). Her oral temperature is 97.3 F (36.3 C). Her blood pressure is 131/59 (abnormal) and her pulse is 67. Her respiration is 18.   Well-developed and well-nourished white female. Head and neck exam shows no ocular or oral lesions. She has no palpable cervical or supraclavicular lymph nodes. Lungs are clear. Cardiac exam regular rate and rhythm with no murmurs rubs or bruits. Abdomen is soft. She has good bowel sounds. She has healing Brosky be scars. There is no fluid wave. There is no palpable liver or spleen tip. Back exam shows no tenderness over the spine or ribs. There is some  tenderness over the right hip area. This is chronic. She has some slight decreased range of motion of the right hip. Extremities shows no clubbing, cyanosis or edema. She has minimal edema in the left lower leg. No obvious venous cord is palpable. She has a negative Homans's sign in the left leg. She has good pulses in the distal arteries. Right leg is relatively unremarkable. Skin exam no rashes, ecchymosis or petechia. Her skin is a little dry. Neurological exam shows no focal neurological deficits.  Lab Results  Component Value Date   WBC 19.4 (H) 12/04/2015    HGB 14.5 12/04/2015   HCT 43.1 12/04/2015   MCV 94 12/04/2015   PLT 260 12/04/2015     Chemistry      Component Value Date/Time   NA 137 10/23/2015 1203   K 4.1 10/23/2015 1203   CL 102 09/25/2015 1431   CO2 21 (L) 10/23/2015 1203   BUN 12.8 10/23/2015 1203   CREATININE 0.8 10/23/2015 1203      Component Value Date/Time   CALCIUM 10.4 10/23/2015 1203   ALKPHOS 131 10/23/2015 1203   AST 14 10/23/2015 1203   ALT 11 10/23/2015 1203   BILITOT 0.36 10/23/2015 1203         Impression and Plan: Brianna Fletcher is 45 year old female. She has a myeloproliferative syndrome. So far, our workup has been totally negative.  II still have to believe that she has some kind of rheumatologic disorder. We are sending off some blood studies today.  I also want to send off TSH and iron studies.   I know that she has been having a lot of problems. I really feel bad for her. I know she is trying her best.  I would like to see her back in about 3 weeks because everything going on.   I spent about 30 minutes with her today.   Brianna Napoleon, MD 9/14/20172:28 PM

## 2015-12-05 LAB — IRON AND TIBC
%SAT: 28 % (ref 21–57)
Iron: 93 ug/dL (ref 41–142)
TIBC: 331 ug/dL (ref 236–444)
UIBC: 238 ug/dL (ref 120–384)

## 2015-12-05 LAB — ANTINUCLEAR ANTIBODIES, IFA: ANA Titer 1: NEGATIVE

## 2015-12-05 LAB — CK TOTAL AND CKMB (NOT AT ARMC)
CK-MB Index: 1.3 ng/mL (ref 0.0–5.3)
Creatine Kinase Total: 25 U/L (ref 24–173)

## 2015-12-05 LAB — TSH: TSH: 0.215 m(IU)/L — ABNORMAL LOW (ref 0.308–3.960)

## 2015-12-05 LAB — RETICULOCYTES: Reticulocyte Count: 1 % (ref 0.6–2.6)

## 2015-12-05 LAB — RHEUMATOID FACTOR: RA Latex Turbid.: <10 IU/mL (ref 0.0–13.9)

## 2015-12-05 LAB — FERRITIN: Ferritin: 71 ng/ml (ref 9–269)

## 2015-12-26 ENCOUNTER — Other Ambulatory Visit (HOSPITAL_BASED_OUTPATIENT_CLINIC_OR_DEPARTMENT_OTHER): Payer: Medicaid Other

## 2015-12-26 ENCOUNTER — Ambulatory Visit (HOSPITAL_BASED_OUTPATIENT_CLINIC_OR_DEPARTMENT_OTHER): Payer: Medicaid Other | Admitting: Family

## 2015-12-26 VITALS — BP 137/83 | HR 64 | Temp 97.7°F | Resp 18 | Wt 245.2 lb

## 2015-12-26 DIAGNOSIS — M25541 Pain in joints of right hand: Secondary | ICD-10-CM

## 2015-12-26 DIAGNOSIS — D471 Chronic myeloproliferative disease: Secondary | ICD-10-CM | POA: Diagnosis not present

## 2015-12-26 DIAGNOSIS — M25542 Pain in joints of left hand: Secondary | ICD-10-CM

## 2015-12-26 DIAGNOSIS — R5383 Other fatigue: Secondary | ICD-10-CM

## 2015-12-26 DIAGNOSIS — D508 Other iron deficiency anemias: Secondary | ICD-10-CM

## 2015-12-26 LAB — CMP (CANCER CENTER ONLY)
ALBUMIN: 3.8 g/dL (ref 3.3–5.5)
ALK PHOS: 104 U/L — AB (ref 26–84)
ALT(SGPT): 19 U/L (ref 10–47)
AST: 16 U/L (ref 11–38)
BILIRUBIN TOTAL: 0.6 mg/dL (ref 0.20–1.60)
BUN, Bld: 15 mg/dL (ref 7–22)
CALCIUM: 10.5 mg/dL — AB (ref 8.0–10.3)
CO2: 28 meq/L (ref 18–33)
CREATININE: 0.6 mg/dL (ref 0.6–1.2)
Chloride: 100 mEq/L (ref 98–108)
Glucose, Bld: 89 mg/dL (ref 73–118)
Potassium: 4.2 mEq/L (ref 3.3–4.7)
SODIUM: 134 meq/L (ref 128–145)
TOTAL PROTEIN: 7.3 g/dL (ref 6.4–8.1)

## 2015-12-26 LAB — CBC WITH DIFFERENTIAL (CANCER CENTER ONLY)
BASO#: 0 10*3/uL (ref 0.0–0.2)
BASO%: 0.2 % (ref 0.0–2.0)
EOS ABS: 0.1 10*3/uL (ref 0.0–0.5)
EOS%: 0.6 % (ref 0.0–7.0)
HEMATOCRIT: 42.2 % (ref 34.8–46.6)
HGB: 13.9 g/dL (ref 11.6–15.9)
LYMPH#: 2.4 10*3/uL (ref 0.9–3.3)
LYMPH%: 19.3 % (ref 14.0–48.0)
MCH: 31 pg (ref 26.0–34.0)
MCHC: 32.9 g/dL (ref 32.0–36.0)
MCV: 94 fL (ref 81–101)
MONO#: 0.8 10*3/uL (ref 0.1–0.9)
MONO%: 6.6 % (ref 0.0–13.0)
NEUT%: 73.3 % (ref 39.6–80.0)
NEUTROS ABS: 9.2 10*3/uL — AB (ref 1.5–6.5)
Platelets: 257 10*3/uL (ref 145–400)
RBC: 4.48 10*6/uL (ref 3.70–5.32)
RDW: 13 % (ref 11.1–15.7)
WBC: 12.5 10*3/uL — AB (ref 3.9–10.0)

## 2015-12-26 LAB — LACTATE DEHYDROGENASE: LDH: 189 U/L (ref 125–245)

## 2015-12-26 NOTE — Progress Notes (Signed)
Hematology and Oncology Follow Up Visit  Brianna Fletcher [id number] 07-Sep-1970 45 y.o. 12/26/2015   Principle Diagnosis:  Chronic myeloproliferative syndrome - Triple negative  Current Therapy:   Peg-Interferon every 3 week dosing    Interim History:  Brianna Fletcher is here today for a follow-up. She is feeling a little better today. Her rash has cleared up and she was tapered off the Prednisone finishing last week. She was released by dermatology last Friday. She also noticed that her arthralgias have improved and she can now make a fist with her right hand again.  She has been having cramping in her legs and feet lately. She has started taking an OTC magnesium supplement and is going to see if this helps.  No fever, chills, n/v, cough, rash, dizziness, SOB, chest pain, palpitations, abdominal pain or change in bowel or bladder habits.  No lymphadenopathy found on exam. No episodes of bleeding or bruising.  No swelling, numbness or tingling in her extremities.  She has maintained a good appetite and is staying well hydrated. Her weight is up 6 lbs since her last visit. She states that she has been eating more on Prednisone. She is excited to go visit her father and son today and see their new dog.   Medications:    Medication List       Accurate as of 12/26/15  1:03 PM. Always use your most recent med list.          amLODipine 5 MG tablet Commonly known as:  NORVASC Take 1 tablet (5 mg total) by mouth daily.   aspirin 81 MG tablet Take 81 mg by mouth daily.   doxepin 25 MG capsule Commonly known as:  SINEQUAN Take 25 mg by mouth.   omeprazole 20 MG capsule Commonly known as:  PRILOSEC TAKE 1 CAPSULE (20 MG TOTAL) BY MOUTH DAILY.   PEGASYS 180 MCG/ML injection Generic drug:  peginterferon alfa-2a Inject 0.45 mLs (81 mcg total) into the skin every 14 (fourteen) days.       Allergies:  Allergies  Allergen Reactions  . Amitiza [Lubiprostone]     Made her sick  .  Lisinopril Cough  . Losartan Cough    Past Medical History, Surgical history, Social history, and Family History were reviewed and updated.  Review of Systems: All other 10 point review of systems is negative.   Physical Exam:  weight is 245 lb 3.2 oz (111.2 kg). Her oral temperature is 97.7 F (36.5 C). Her blood pressure is 137/83 and her pulse is 64. Her respiration is 18 and oxygen saturation is 99%.   Wt Readings from Last 3 Encounters:  12/26/15 245 lb 3.2 oz (111.2 kg)  12/04/15 239 lb (108.4 kg)  10/23/15 239 lb (108.4 kg)    Ocular: Sclerae unicteric, pupils equal, round and reactive to light Ear-nose-throat: Oropharynx clear, dentition fair Lymphatic: No cervical supraclavicular or axillary adenopathy Lungs no rales or rhonchi, good excursion bilaterally Heart regular rate and rhythm, no murmur appreciated Abd soft, nontender, positive bowel sounds, no liver or spleen tip palpated on exam MSK no focal spinal tenderness, no joint edema Neuro: non-focal, well-oriented, appropriate affect Breasts: Deferred  Lab Results  Component Value Date   WBC 12.5 (H) 12/26/2015   HGB 13.9 12/26/2015   HCT 42.2 12/26/2015   MCV 94 12/26/2015   PLT 257 12/26/2015   Lab Results  Component Value Date   FERRITIN 71 12/04/2015   IRON 93 12/04/2015   TIBC 331 12/04/2015  UIBC 238 12/04/2015   IRONPCTSAT 28 12/04/2015   Lab Results  Component Value Date   RETICCTPCT 5.3 (H) 11/20/2013   RBC 4.48 12/26/2015   RETICCTABS 116.1 11/20/2013   No results found for: KPAFRELGTCHN, LAMBDASER, KAPLAMBRATIO No results found for: IGGSERUM, IGA, IGMSERUM No results found for: Odetta Pink, SPEI   Chemistry      Component Value Date/Time   NA 134 12/26/2015 1133   NA 136 12/04/2015 1255   K 4.2 12/26/2015 1133   K 4.1 12/04/2015 1255   CL 100 12/26/2015 1133   CO2 28 12/26/2015 1133   CO2 21 (L) 12/04/2015 1255   BUN 15  12/26/2015 1133   BUN 19.8 12/04/2015 1255   CREATININE 0.6 12/26/2015 1133   CREATININE 0.8 12/04/2015 1255      Component Value Date/Time   CALCIUM 10.5 (H) 12/26/2015 1133   CALCIUM 10.8 (H) 12/04/2015 1255   ALKPHOS 104 (H) 12/26/2015 1133   ALKPHOS 114 12/04/2015 1255   AST 16 12/26/2015 1133   AST 9 12/04/2015 1255   ALT 19 12/26/2015 1133   ALT 12 12/04/2015 1255   BILITOT 0.60 12/26/2015 1133   BILITOT 0.41 12/04/2015 1255     Impression and Plan: Brianna Fletcher is a 45 yo white female with a myeloproliferative syndrome. She is finally starting to feel a bit better. Her WBC count off of Prednisone is down to 12.5. No anemia.  She is doing well on the every 3 weeks dosing for her Pegasys injections. We will continue her on this same schedule.   Dr. Marin Olp has referred her to a rheumatologist that accepts Medicaid patients. The office are supposed to send him some paper work to fill out before setting up her appointment. We will be on the look out for this.  We will plan to see her back in 6 weeks for repeat labs and follow-up She will contact our office with any questions or concerns. We can certainly see her sooner if need be.   Eliezer Bottom, NP 10/6/20171:03 PM

## 2016-01-02 ENCOUNTER — Other Ambulatory Visit: Payer: Self-pay

## 2016-01-02 DIAGNOSIS — I10 Essential (primary) hypertension: Secondary | ICD-10-CM

## 2016-01-02 MED ORDER — AMLODIPINE BESYLATE 5 MG PO TABS: 5.0000 mg | ORAL_TABLET | Freq: Every day | ORAL | 1 refills | Status: DC

## 2016-01-26 ENCOUNTER — Other Ambulatory Visit: Payer: Self-pay

## 2016-01-26 MED ORDER — OMEPRAZOLE 20 MG PO CPDR: DELAYED_RELEASE_CAPSULE | ORAL | 11 refills | Status: DC

## 2016-02-06 ENCOUNTER — Other Ambulatory Visit (HOSPITAL_BASED_OUTPATIENT_CLINIC_OR_DEPARTMENT_OTHER): Payer: Medicaid Other

## 2016-02-06 ENCOUNTER — Ambulatory Visit (HOSPITAL_BASED_OUTPATIENT_CLINIC_OR_DEPARTMENT_OTHER): Payer: Medicaid Other

## 2016-02-06 ENCOUNTER — Ambulatory Visit (HOSPITAL_BASED_OUTPATIENT_CLINIC_OR_DEPARTMENT_OTHER): Payer: Medicaid Other | Admitting: Hematology & Oncology

## 2016-02-06 VITALS — BP 140/70 | HR 64 | Temp 97.4°F | Resp 20 | Wt 246.8 lb

## 2016-02-06 DIAGNOSIS — D471 Chronic myeloproliferative disease: Secondary | ICD-10-CM | POA: Diagnosis not present

## 2016-02-06 DIAGNOSIS — Z23 Encounter for immunization: Secondary | ICD-10-CM

## 2016-02-06 DIAGNOSIS — R5383 Other fatigue: Secondary | ICD-10-CM

## 2016-02-06 LAB — COMPREHENSIVE METABOLIC PANEL
ALT: 9 U/L (ref 0–55)
AST: 11 U/L (ref 5–34)
Albumin: 3.6 g/dL (ref 3.5–5.0)
Alkaline Phosphatase: 143 U/L (ref 40–150)
Anion Gap: 12 mEq/L — ABNORMAL HIGH (ref 3–11)
BUN: 15.2 mg/dL (ref 7.0–26.0)
CHLORIDE: 106 meq/L (ref 98–109)
CO2: 21 mEq/L — ABNORMAL LOW (ref 22–29)
Calcium: 10.7 mg/dL — ABNORMAL HIGH (ref 8.4–10.4)
Creatinine: 0.7 mg/dL (ref 0.6–1.1)
EGFR: >90 mL/min/{1.73_m2} (ref >90)
GLUCOSE: 78 mg/dL (ref 70–140)
Potassium: 3.9 mEq/L (ref 3.5–5.1)
SODIUM: 138 meq/L (ref 136–145)
Total Bilirubin: 0.44 mg/dL (ref 0.20–1.20)
Total Protein: 7.9 g/dL (ref 6.4–8.3)

## 2016-02-06 LAB — CBC WITH DIFFERENTIAL (CANCER CENTER ONLY)
BASO#: 0 10*3/uL (ref 0.0–0.2)
BASO%: 0.2 % (ref 0.0–2.0)
EOS%: 0.6 % (ref 0.0–7.0)
Eosinophils Absolute: 0.1 10*3/uL (ref 0.0–0.5)
HCT: 41.6 % (ref 34.8–46.6)
HGB: 13.9 g/dL (ref 11.6–15.9)
LYMPH#: 3.4 10*3/uL — ABNORMAL HIGH (ref 0.9–3.3)
LYMPH%: 19.2 % (ref 14.0–48.0)
MCH: 31.3 pg (ref 26.0–34.0)
MCHC: 33.4 g/dL (ref 32.0–36.0)
MCV: 94 fL (ref 81–101)
MONO#: 1 10*3/uL — ABNORMAL HIGH (ref 0.1–0.9)
MONO%: 5.8 % (ref 0.0–13.0)
NEUT%: 74.2 % (ref 39.6–80.0)
NEUTROS ABS: 13.2 10*3/uL — AB (ref 1.5–6.5)
Platelets: 313 10*3/uL (ref 145–400)
RBC: 4.44 10*6/uL (ref 3.70–5.32)
RDW: 13.8 % (ref 11.1–15.7)
WBC: 17.8 10*3/uL — ABNORMAL HIGH (ref 3.9–10.0)

## 2016-02-06 LAB — LACTATE DEHYDROGENASE: LDH: 175 U/L (ref 125–245)

## 2016-02-06 LAB — TECHNOLOGIST REVIEW CHCC SATELLITE: Tech Review: 1

## 2016-02-06 MED ORDER — INFLUENZA VAC SPLIT QUAD 0.5 ML IM SUSY
0.5000 mL | PREFILLED_SYRINGE | Freq: Once | INTRAMUSCULAR | Status: AC
  Administered 2016-02-06: 0.5 mL via INTRAMUSCULAR
  Filled 2016-02-06: qty 0.5

## 2016-02-06 NOTE — Patient Instructions (Signed)

## 2016-02-06 NOTE — Progress Notes (Signed)
Hematology and Oncology Follow Up Visit  Brianna Fletcher [id number] 08/21/1970 45 y.o. 02/06/2016   Principle Diagnosis:  Chronic myeloproliferative syndrome-Triple negative  Current Therapy:   Peg-Interferon q weekly - q 3 week dosing.     Interim History:  [name] is back for followup. She is having a tough time right now. She feels quite tired. She does not have a lot of energy. Thankfully, Brianna arthralgias are not as bad.  Apparently, there has been a lot of illness in Brianna family. This also has caused some stress for Brianna.  As far as the interferon is concerned, she has done okay with this. We are checking Brianna TSH. Back in September Brianna TSH was 0.215.  She has had no rashes. She has had issues with bowels or bladder. I think she is a little constipated.  [name] had no cough.  Brianna Fletcher is basically in an [place]. He is headed down to [place] now.  [name] had no fever.  Overall, Brianna performance status is ECOG 1.  Medications:  Current Outpatient Prescriptions:  .  amLODipine (NORVASC) 5 MG tablet, Take 1 tablet (5 mg total) by mouth daily., Disp: 90 tablet, Rfl: 1 .  aspirin 81 MG tablet, Take 81 mg by mouth daily., Disp: , Rfl:  .  doxepin (SINEQUAN) 25 MG capsule, Take 25 mg by mouth., Disp: , Rfl:  .  PEGASYS 180 MCG/ML injection, Inject 0.45 mLs (81 mcg total) into the skin every 14 (fourteen) days., Disp: 2 mL, Rfl: 3 .  omeprazole (PRILOSEC) 20 MG capsule, TAKE 1 CAPSULE (20 MG TOTAL) BY MOUTH DAILY., Disp: 30 capsule, Rfl: 11 No current facility-administered medications for this visit.   Facility-Administered Medications Ordered in Other Visits:  .  acetaminophen (TYLENOL) tablet 650 mg, 650 mg, Oral, Once, Brianna Napoleon, MD .  peginterferon alfa-2a (PEGASYS) injection 81 mcg, 81 mcg, Subcutaneous, Weekly, Brianna Napoleon, MD, 81 mcg at 04/26/14 1530  Allergies:  Allergies  Allergen Reactions  . Amitiza [Lubiprostone]     Made Brianna sick    . Lisinopril Cough  . Losartan Cough    Past Medical History, Surgical history, Social history, and Family History were reviewed and updated.  Review of Systems: As above  Physical Exam:  weight is 246 lb 12.8 oz (111.9 kg). Brianna oral temperature is 97.4 F (36.3 C). Brianna blood pressure is 140/70 and Brianna pulse is 64. Brianna respiration is 20.   Well-developed and well-nourished white female. Head and neck exam shows no ocular or oral lesions. She has no palpable cervical or supraclavicular lymph nodes. Lungs are clear. Cardiac exam regular rate and rhythm with no murmurs rubs or bruits. Abdomen is soft. She has good bowel sounds. She has healing Brosky be scars. There is no fluid wave. There is no palpable liver or spleen tip. Back exam shows no tenderness over the spine or ribs. There is some tenderness over the right hip area. This is chronic. She has some slight decreased range of motion of the right hip. Extremities shows no clubbing, cyanosis or edema. She has minimal edema in the left lower leg. No obvious venous cord is palpable. She has a negative Homans's sign in the left leg. She has good pulses in the distal arteries. Right leg is relatively unremarkable. Skin exam no rashes, ecchymosis or petechia. Brianna skin is a little dry. Neurological exam shows no focal neurological deficits.  Lab Results  Component Value Date  WBC 17.8 (H) 02/06/2016   HGB 13.9 02/06/2016   HCT 41.6 02/06/2016   MCV 94 02/06/2016   PLT 313 02/06/2016     Chemistry      Component Value Date/Time   NA 134 12/26/2015 1133   NA 136 12/04/2015 1255   K 4.2 12/26/2015 1133   K 4.1 12/04/2015 1255   CL 100 12/26/2015 1133   CO2 28 12/26/2015 1133   CO2 21 (L) 12/04/2015 1255   BUN 15 12/26/2015 1133   BUN 19.8 12/04/2015 1255   CREATININE 0.6 12/26/2015 1133   CREATININE 0.8 12/04/2015 1255      Component Value Date/Time   CALCIUM 10.5 (H) 12/26/2015 1133   CALCIUM 10.8 (H) 12/04/2015 1255   ALKPHOS  104 (H) 12/26/2015 1133   ALKPHOS 114 12/04/2015 1255   AST 16 12/26/2015 1133   AST 9 12/04/2015 1255   ALT 19 12/26/2015 1133   ALT 12 12/04/2015 1255   BILITOT 0.60 12/26/2015 1133   BILITOT 0.41 12/04/2015 1255         Impression and Plan: Ms. Vuncannon is 45 year old female. She has a myeloproliferative syndrome. So far, our workup has been totally negative.  II will hold Brianna interferon for right now. She does it every 3 weeks. I will like to see if we can hold it a little bit longer than 3 weeks and see if Brianna fatigue improves.   If Brianna fatigue does improve and Brianna blood counts are stable, then I would favor continuing to hold interferon.   We may have to decrease the dose of interferon.   I will like to see Brianna back in about 4 weeks.   I spent about 30 minutes with Brianna today.   Brianna Napoleon, MD 11/17/20179:35 AM

## 2016-02-17 DIAGNOSIS — Z0279 Encounter for issue of other medical certificate: Secondary | ICD-10-CM | POA: Diagnosis not present

## 2016-03-05 ENCOUNTER — Ambulatory Visit: Payer: Medicaid Other | Admitting: Hematology & Oncology

## 2016-03-05 ENCOUNTER — Other Ambulatory Visit: Payer: Medicaid Other

## 2016-03-10 ENCOUNTER — Other Ambulatory Visit (HOSPITAL_BASED_OUTPATIENT_CLINIC_OR_DEPARTMENT_OTHER): Payer: Medicaid Other

## 2016-03-10 ENCOUNTER — Ambulatory Visit (HOSPITAL_BASED_OUTPATIENT_CLINIC_OR_DEPARTMENT_OTHER): Payer: Medicaid Other | Admitting: Hematology & Oncology

## 2016-03-10 ENCOUNTER — Encounter: Payer: Self-pay | Admitting: Hematology & Oncology

## 2016-03-10 VITALS — BP 147/76 | HR 70 | Temp 97.7°F | Resp 16 | Ht 67.0 in | Wt 249.0 lb

## 2016-03-10 DIAGNOSIS — D471 Chronic myeloproliferative disease: Secondary | ICD-10-CM | POA: Diagnosis not present

## 2016-03-10 DIAGNOSIS — E038 Other specified hypothyroidism: Secondary | ICD-10-CM

## 2016-03-10 DIAGNOSIS — E041 Nontoxic single thyroid nodule: Secondary | ICD-10-CM

## 2016-03-10 LAB — CBC WITH DIFFERENTIAL (CANCER CENTER ONLY)
BASO#: 0 10*3/uL (ref 0.0–0.2)
BASO%: 0.2 % (ref 0.0–2.0)
EOS%: 0.4 % (ref 0.0–7.0)
Eosinophils Absolute: 0.1 10*3/uL (ref 0.0–0.5)
HCT: 40.2 % (ref 34.8–46.6)
HGB: 13.3 g/dL (ref 11.6–15.9)
LYMPH#: 2.6 10*3/uL (ref 0.9–3.3)
LYMPH%: 14.1 % (ref 14.0–48.0)
MCH: 31.5 pg (ref 26.0–34.0)
MCHC: 33.1 g/dL (ref 32.0–36.0)
MCV: 95 fL (ref 81–101)
MONO#: 0.9 10*3/uL (ref 0.1–0.9)
MONO%: 4.8 % (ref 0.0–13.0)
NEUT#: 15.1 10*3/uL — ABNORMAL HIGH (ref 1.5–6.5)
NEUT%: 80.5 % — AB (ref 39.6–80.0)
PLATELETS: 292 10*3/uL (ref 145–400)
RBC: 4.22 10*6/uL (ref 3.70–5.32)
RDW: 13.4 % (ref 11.1–15.7)
WBC: 18.7 10*3/uL — AB (ref 3.9–10.0)

## 2016-03-10 LAB — COMPREHENSIVE METABOLIC PANEL
ALT: 8 U/L (ref 0–55)
AST: 12 U/L (ref 5–34)
Albumin: 4 g/dL (ref 3.5–5.0)
Alkaline Phosphatase: 132 U/L (ref 40–150)
Anion Gap: 9 mEq/L (ref 3–11)
BUN: 10.2 mg/dL (ref 7.0–26.0)
CHLORIDE: 102 meq/L (ref 98–109)
CO2: 24 meq/L (ref 22–29)
Calcium: 10.7 mg/dL — ABNORMAL HIGH (ref 8.4–10.4)
Creatinine: 0.7 mg/dL (ref 0.6–1.1)
EGFR: >90 mL/min/{1.73_m2} (ref >90)
GLUCOSE: 90 mg/dL (ref 70–140)
POTASSIUM: 3.9 meq/L (ref 3.5–5.1)
Sodium: 136 mEq/L (ref 136–145)
Total Bilirubin: 0.63 mg/dL (ref 0.20–1.20)
Total Protein: 7.9 g/dL (ref 6.4–8.3)

## 2016-03-10 LAB — CHCC SATELLITE - SMEAR

## 2016-03-10 LAB — TSH: TSH: 2.122 m(IU)/L (ref 0.308–3.960)

## 2016-03-10 MED ORDER — LEVOTHYROXINE SODIUM 50 MCG PO TABS: 50.0000 ug | ORAL_TABLET | Freq: Every day | ORAL | 3 refills | Status: DC

## 2016-03-10 NOTE — Progress Notes (Signed)
Hematology and Oncology Follow Up Visit  Brianna Fletcher [id number] 05-08-1970 45 y.o. 03/10/2016   Principle Diagnosis:  Chronic myeloproliferative syndrome-Triple negative Sub-clinical hypothyroidism  Current Therapy:   Peg-Interferon q weekly - q 3 week dosing. Synthroid 0.050 mg po q day     Interim History:  Ms.  Brianna Fletcher is back for followup. She is still having a tough time. We have her off interferon. [name] been off it now for over a month. I thought that this might be elevated make her feel better.  We did check her TSH. It was 12.2.  This is not bad. However, I think it may make sense to put her on some Synthroid. [name] gaining quite a bit overweight. She is losing hair. I think the Synthroid might make a difference. If not, then at least we have tried.   I will know if she has fibromyalgia. I told her this would be a difficult diagnosis to improve.  Her appetite is doing okay. She try to watch what she eats.   She is under a lot of stress today. She went to see her mother this morning. Her mother has been in a pretty tough way. This causes a lot of stress for Brianna Fletcher.   [name] had no diarrhea. [name] had no rashes. [name] had some slightly dry skin.   Overall, her performance status is ECOG 1.  Medications:  Current Outpatient Prescriptions:  .  amLODipine (NORVASC) 5 MG tablet, Take 1 tablet (5 mg total) by mouth daily., Disp: 90 tablet, Rfl: 1 .  aspirin 81 MG tablet, Take 81 mg by mouth daily., Disp: , Rfl:  .  doxepin (SINEQUAN) 25 MG capsule, Take 25 mg by mouth., Disp: , Rfl:  .  levothyroxine (SYNTHROID, LEVOTHROID) 50 MCG tablet, Take 1 tablet (50 mcg total) by mouth daily before breakfast., Disp: 30 tablet, Rfl: 3 .  omeprazole (PRILOSEC) 20 MG capsule, TAKE 1 CAPSULE (20 MG TOTAL) BY MOUTH DAILY., Disp: 30 capsule, Rfl: 11 .  PEGASYS 180 MCG/ML injection, Inject 0.45 mLs (81 mcg total) into the skin every 14 (fourteen) days., Disp: 2 mL, Rfl: 3 No current  facility-administered medications for this visit.   Facility-Administered Medications Ordered in Other Visits:  .  acetaminophen (TYLENOL) tablet 650 mg, 650 mg, Oral, Once, Brianna Napoleon, MD .  peginterferon alfa-2a (PEGASYS) injection 81 mcg, 81 mcg, Subcutaneous, Weekly, Brianna Napoleon, MD, 81 mcg at 04/26/14 1530  Allergies:  Allergies  Allergen Reactions  . Amitiza [Lubiprostone]     Made her sick  . Lisinopril Cough  . Losartan Cough    Past Medical History, Surgical history, Social history, and Family History were reviewed and updated.  Review of Systems: As above  Physical Exam:  height is 5\' 7"  (1.702 m) and weight is 249 lb (112.9 kg). Her oral temperature is 97.7 F (36.5 C). Her blood pressure is 147/76 (abnormal) and her pulse is 70. Her respiration is 16.   Well-developed and well-nourished white female. Head and neck exam shows no ocular or oral lesions. She has no palpable cervical or supraclavicular lymph nodes. Lungs are clear. Cardiac exam regular rate and rhythm with no murmurs rubs or bruits. Abdomen is soft. She has good bowel sounds. She has healing Brosky be scars. There is no fluid wave. There is no palpable liver or spleen tip. Back exam shows no tenderness over the spine or ribs. There is some tenderness over the right hip area. This is chronic. She  has some slight decreased range of motion of the right hip. Extremities shows no clubbing, cyanosis or edema. She has minimal edema in the left lower leg. No obvious venous cord is palpable. She has a negative Homans's sign in the left leg. She has good pulses in the distal arteries. Right leg is relatively unremarkable. Skin exam no rashes, ecchymosis or petechia. Her skin is a little dry. Neurological exam shows no focal neurological deficits.  Lab Results  Component Value Date   WBC 18.7 (H) 03/10/2016   HGB 13.3 03/10/2016   HCT 40.2 03/10/2016   MCV 95 03/10/2016   PLT 292 03/10/2016     Chemistry        Component Value Date/Time   NA 136 03/10/2016 1147   K 3.9 03/10/2016 1147   CL 100 12/26/2015 1133   CO2 24 03/10/2016 1147   BUN 10.2 03/10/2016 1147   CREATININE 0.7 03/10/2016 1147      Component Value Date/Time   CALCIUM 10.7 (H) 03/10/2016 1147   ALKPHOS 132 03/10/2016 1147   AST 12 03/10/2016 1147   ALT 8 03/10/2016 1147   BILITOT 0.63 03/10/2016 1147         Impression and Plan: Brianna Fletcher is 45 year old female. She has a myeloproliferative syndrome. So far, our workup has been totally negative.  II will continue to hold her interferon. Her blood counts certainly are stable. As such, I don't think we really need to get her back on the interferon.   I will like to see her back in 4 weeks. Maybe, the Synthroid will have helped her.   I spent about 30 minutes with her today.   Brianna Napoleon, MD 12/20/20175:34 PM

## 2016-03-19 ENCOUNTER — Other Ambulatory Visit: Payer: Self-pay | Admitting: Nurse Practitioner

## 2016-03-22 ENCOUNTER — Other Ambulatory Visit: Payer: Self-pay | Admitting: Nurse Practitioner

## 2016-03-26 ENCOUNTER — Other Ambulatory Visit: Payer: Self-pay | Admitting: Nurse Practitioner

## 2016-04-02 ENCOUNTER — Other Ambulatory Visit: Payer: Self-pay | Admitting: Nurse Practitioner

## 2016-04-07 ENCOUNTER — Other Ambulatory Visit: Payer: Self-pay | Admitting: Nurse Practitioner

## 2016-04-12 ENCOUNTER — Ambulatory Visit (HOSPITAL_BASED_OUTPATIENT_CLINIC_OR_DEPARTMENT_OTHER): Payer: Medicaid Other | Admitting: Hematology & Oncology

## 2016-04-12 ENCOUNTER — Other Ambulatory Visit (HOSPITAL_BASED_OUTPATIENT_CLINIC_OR_DEPARTMENT_OTHER): Payer: Medicaid Other

## 2016-04-12 VITALS — BP 151/80 | HR 59 | Temp 97.7°F | Resp 16 | Wt 242.0 lb

## 2016-04-12 DIAGNOSIS — E038 Other specified hypothyroidism: Secondary | ICD-10-CM | POA: Diagnosis not present

## 2016-04-12 DIAGNOSIS — E041 Nontoxic single thyroid nodule: Secondary | ICD-10-CM

## 2016-04-12 DIAGNOSIS — D471 Chronic myeloproliferative disease: Secondary | ICD-10-CM

## 2016-04-12 LAB — CBC WITH DIFFERENTIAL (CANCER CENTER ONLY)
BASO#: 0 10*3/uL (ref 0.0–0.2)
BASO%: 0.2 % (ref 0.0–2.0)
EOS%: 0.4 % (ref 0.0–7.0)
Eosinophils Absolute: 0.1 10*3/uL (ref 0.0–0.5)
HEMATOCRIT: 46.2 % (ref 34.8–46.6)
HGB: 15.2 g/dL (ref 11.6–15.9)
LYMPH#: 2.8 10*3/uL (ref 0.9–3.3)
LYMPH%: 15.7 % (ref 14.0–48.0)
MCH: 31.1 pg (ref 26.0–34.0)
MCHC: 32.9 g/dL (ref 32.0–36.0)
MCV: 95 fL (ref 81–101)
MONO#: 0.9 10*3/uL (ref 0.1–0.9)
MONO%: 4.9 % (ref 0.0–13.0)
NEUT#: 14.2 10*3/uL — ABNORMAL HIGH (ref 1.5–6.5)
NEUT%: 78.8 % (ref 39.6–80.0)
Platelets: 273 10*3/uL (ref 145–400)
RBC: 4.89 10*6/uL (ref 3.70–5.32)
RDW: 12.9 % (ref 11.1–15.7)
WBC: 18 10*3/uL — ABNORMAL HIGH (ref 3.9–10.0)

## 2016-04-12 LAB — CHCC SATELLITE - SMEAR

## 2016-04-12 LAB — LACTATE DEHYDROGENASE: LDH: 149 U/L (ref 125–245)

## 2016-04-12 NOTE — Progress Notes (Signed)
Hematology and Oncology Follow Up Visit  Brianna Fletcher [id number] [birth year] 46 y.o. 04/12/2016   Principle Diagnosis:  Chronic myeloproliferative syndrome-Triple negative Sub-clinical hypothyroidism  Current Therapy:   Peg-Interferon q weekly - q 3 week dosing. Synthroid 0.050 mg po q day     Interim History:  [name] is back for followup. She is still having a tough time. She is now itching a lot more. We have her off interferon. [name] been off it now for over 2 months.    We did check her TSH. It was 2.12.  This is not bad. However, I thought that it might make sense to put her on some Synthroid. d.   I will know if she has fibromyalgia. I told her this would be a difficult diagnosis to improve.  Her appetite is doing okay. She try to watch what she eats.   She is under a lot of stress today. She went to see her mother this morning. Her mother has been in a pretty tough way. This causes a lot of stress for Ms. [name].   [name] had no diarrhea. [name] had no rashes. [name] had some slightly dry skin.   Overall, her performance status is ECOG 1.  Medications:  Current Outpatient Prescriptions:  .  amLODipine (NORVASC) 5 MG tablet, Take 1 tablet (5 mg total) by mouth daily., Disp: 90 tablet, Rfl: 1 .  aspirin 81 MG tablet, Take 81 mg by mouth daily., Disp: , Rfl:  .  doxepin (SINEQUAN) 25 MG capsule, Take 25 mg by mouth., Disp: , Rfl:  .  levothyroxine (SYNTHROID, LEVOTHROID) 50 MCG tablet, Take 1 tablet (50 mcg total) by mouth daily before breakfast., Disp: 30 tablet, Rfl: 3 .  omeprazole (PRILOSEC) 20 MG capsule, TAKE 1 CAPSULE (20 MG TOTAL) BY MOUTH DAILY., Disp: 30 capsule, Rfl: 11 .  PEGASYS 180 MCG/ML injection, Inject 0.45 mLs (81 mcg total) into the skin every 14 (fourteen) days., Disp: 2 mL, Rfl: 3 No current facility-administered medications for this visit.   Facility-Administered Medications Ordered in Other Visits:  .  acetaminophen (TYLENOL) tablet 650 mg,  650 mg, Oral, Once, [name], MD .  peginterferon alfa-2a (PEGASYS) injection 81 mcg, 81 mcg, Subcutaneous, Weekly, [name], MD, 81 mcg at 04/26/14 1530  Allergies:  Allergies  Allergen Reactions  . Amitiza [Lubiprostone]     Made her sick  . Lisinopril Cough  . Losartan Cough    Past Medical History, Surgical history, Social history, and Family History were reviewed and updated.  Review of Systems: As above  Physical Exam:  weight is 242 lb (109.8 kg). Her oral temperature is 97.7 F (36.5 C). Her blood pressure is 151/80 (abnormal) and her pulse is 59 (abnormal). Her respiration is 16 and oxygen saturation is 100%.   Well-developed and well-nourished white female. Head and neck exam shows no ocular or oral lesions. She has no palpable cervical or supraclavicular lymph nodes. Lungs are clear. Cardiac exam regular rate and rhythm with no murmurs rubs or bruits. Abdomen is soft. She has good bowel sounds. She has healing Brosky be scars. There is no fluid wave. There is no palpable liver or spleen tip. Back exam shows no tenderness over the spine or ribs. There is some tenderness over the right hip area. This is chronic. She has some slight decreased range of motion of the right hip. Extremities shows no clubbing, cyanosis or edema. She has minimal edema in the left lower leg.  No obvious venous cord is palpable. She has a negative Homans's sign in the left leg. She has good pulses in the distal arteries. Right leg is relatively unremarkable. Skin exam no rashes, ecchymosis or petechia. Her skin is a little dry. Neurological exam shows no focal neurological deficits.  Lab Results  Component Value Date   WBC 18.0 (H) 04/12/2016   HGB 15.2 04/12/2016   HCT 46.2 04/12/2016   MCV 95 04/12/2016   PLT 273 04/12/2016     Chemistry      Component Value Date/Time   NA 136 03/10/2016 1147   K 3.9 03/10/2016 1147   CL 100 12/26/2015 1133   CO2 24 03/10/2016 1147   BUN 10.2  03/10/2016 1147   CREATININE 0.7 03/10/2016 1147      Component Value Date/Time   CALCIUM 10.7 (H) 03/10/2016 1147   ALKPHOS 132 03/10/2016 1147   AST 12 03/10/2016 1147   ALT 8 03/10/2016 1147   BILITOT 0.63 03/10/2016 1147         Impression and Plan: Brianna Fletcher is 45 year old female. She has a myeloproliferative syndrome. So far, our workup has been totally negative.  Despite her white cell count being stable, the fact that she has more pruritus is somewhat concerning. This could be an indicator for bone marrow activity. This might be indicated of restarting interferon.  She would prefer to hold interferon for right now. She says that if she does have more symptoms of pruritus, then she will get back in here sooner than one month.  I will like to see her back in 4 weeks.   I spent about 30 minutes with her today.   [name], MD 1/22/20182:37 PM

## 2016-04-13 LAB — IRON AND TIBC
%SAT: 31 % (ref 21–57)
IRON: 108 ug/dL (ref 41–142)
TIBC: 345 ug/dL (ref 236–444)
UIBC: 237 ug/dL (ref 120–384)

## 2016-04-13 LAB — FERRITIN: Ferritin: 73 ng/ml (ref 9–269)

## 2016-04-15 ENCOUNTER — Emergency Department (HOSPITAL_BASED_OUTPATIENT_CLINIC_OR_DEPARTMENT_OTHER): Payer: Medicaid Other

## 2016-04-15 ENCOUNTER — Other Ambulatory Visit: Payer: Self-pay

## 2016-04-15 ENCOUNTER — Emergency Department (HOSPITAL_BASED_OUTPATIENT_CLINIC_OR_DEPARTMENT_OTHER)
Admission: EM | Admit: 2016-04-15 | Discharge: 2016-04-15 | Disposition: A | Discharge status: Home / Self Care | Payer: Medicaid Other | Attending: Emergency Medicine | Admitting: Emergency Medicine

## 2016-04-15 ENCOUNTER — Encounter (HOSPITAL_BASED_OUTPATIENT_CLINIC_OR_DEPARTMENT_OTHER): Payer: Self-pay | Admitting: *Deleted

## 2016-04-15 DIAGNOSIS — M79662 Pain in left lower leg: Secondary | ICD-10-CM | POA: Insufficient documentation

## 2016-04-15 DIAGNOSIS — I10 Essential (primary) hypertension: Secondary | ICD-10-CM | POA: Insufficient documentation

## 2016-04-15 DIAGNOSIS — Z7982 Long term (current) use of aspirin: Secondary | ICD-10-CM | POA: Diagnosis not present

## 2016-04-15 DIAGNOSIS — R079 Chest pain, unspecified: Secondary | ICD-10-CM | POA: Diagnosis not present

## 2016-04-15 DIAGNOSIS — Z79899 Other long term (current) drug therapy: Secondary | ICD-10-CM | POA: Diagnosis not present

## 2016-04-15 LAB — CBC WITH DIFFERENTIAL/PLATELET
BASOS ABS: 0 10*3/uL (ref 0.0–0.1)
BASOS PCT: 0 %
EOS ABS: 0.4 10*3/uL (ref 0.0–0.7)
Eosinophils Relative: 2 %
HEMATOCRIT: 45.3 % (ref 36.0–46.0)
HEMOGLOBIN: 15 g/dL (ref 12.0–15.0)
LYMPHS PCT: 18 %
Lymphs Abs: 3.7 10*3/uL (ref 0.7–4.0)
MCH: 31.5 pg (ref 26.0–34.0)
MCHC: 33.1 g/dL (ref 30.0–36.0)
MCV: 95.2 fL (ref 78.0–100.0)
MONOS PCT: 4 %
Monocytes Absolute: 0.8 10*3/uL (ref 0.1–1.0)
NEUTROS ABS: 15.8 10*3/uL — AB (ref 1.7–7.7)
NEUTROS PCT: 76 %
Platelets: 284 10*3/uL (ref 150–400)
RBC: 4.76 MIL/uL (ref 3.87–5.11)
RDW: 13.1 % (ref 11.5–15.5)
WBC: 20.7 10*3/uL — ABNORMAL HIGH (ref 4.0–10.5)

## 2016-04-15 LAB — BASIC METABOLIC PANEL
Anion gap: 7 (ref 5–15)
BUN: 14 mg/dL (ref 6–20)
CHLORIDE: 104 mmol/L (ref 101–111)
CO2: 25 mmol/L (ref 22–32)
CREATININE: 0.69 mg/dL (ref 0.44–1.00)
Calcium: 10.7 mg/dL — ABNORMAL HIGH (ref 8.9–10.3)
GFR calc non Af Amer: >60 mL/min (ref >60)
Glucose, Bld: 83 mg/dL (ref 65–99)
POTASSIUM: 3.9 mmol/L (ref 3.5–5.1)
SODIUM: 136 mmol/L (ref 135–145)

## 2016-04-15 LAB — TROPONIN I: Troponin I: <0.03 ng/mL (ref <0.03)

## 2016-04-15 NOTE — ED Provider Notes (Signed)
12:12 PM  ED ECG REPORT   Date: 04/15/2016  Rate: 73  Rhythm: normal sinus rhythm  QRS Axis: normal  Intervals: normal  ST/T Wave abnormalities: normal  Conduction Disutrbances: none  Narrative Interpretation: unremarkable, no comparison available EKG link not available in epic for interpretation into muse      Alfonzo Beers, MD 04/15/16 1213

## 2016-04-15 NOTE — ED Triage Notes (Signed)
Chest pain for a week. Started as soreness after a cough. Pinching feeling after the soreness went away. Left leg started swelling last night.

## 2016-04-15 NOTE — Discharge Instructions (Signed)
You may take Tylenol or ibuprofen for persistent symptoms. Follow-up with a primary care doctor regarding your visit today. You may return for new or concerning symptoms.

## 2016-04-15 NOTE — ED Provider Notes (Signed)
[facility] Provider Note   CSN: [id number] Arrival date & time: 04/15/16  1040     History   Chief Complaint Chief Complaint  Patient presents with  . Chest Pain    HPI Brianna Fletcher is a 46 y.o. female.  46 year old female with a history of unspecified leukocytosis, chronic neutrophilia, precordial chest pain, and hypertension presents to the emergency department for evaluation of chest pain. She reports a "pinching" chest pain which is sporadic throughout the day and without modifying factors. She states that pain will last for less than a minute and then spontaneously resolved. Patient has had symptoms over the last 4-5 days. She denies taking any medications for her associated pain. She does report a tenderness in her left calf without significant swelling. She has not had any recent fevers. She denies associated syncope, diaphoresis, nausea, vomiting, abdominal pain. No personal hx of ACS or DVT. She has a hx of normal cardiac cath in 2016.   The history is provided by the patient. No language interpreter was used.    Past Medical History:  Diagnosis Date  . Arthritis   . Blood dyscrasia    produces too many WBCs  . Chronic neutrophilia 11/15/2012  . Family history of heart disease    mother had stents placed in her 15s and had myocardial infarctions  . Glaucoma   . Hypertension   . Leukocytosis, unspecified 11/15/2012  . Monocytosis 11/15/2012  . Myeloproliferative disorder ([place]) 03/08/2013  . Precordial chest pain   . Pruritus 11/15/2012  . Radial artery occlusion, right (HCC)    status post  right radial artery cardiac catheterization  . Vaginal delivery 1986, 2000    Patient Active Problem List   Diagnosis Date Noted  . Change in stool habits 05/11/2015  . Rectal bleeding 05/11/2015  . S/P hysterectomy 03/05/2015  . Abdominal pain 01/14/2015  . Constipation 01/14/2015  . Severe obesity (BMI >= 40) ([place]) 12/23/2014  . Morbid obesity ([place])  12/23/2014  . Radial artery occlusion, right ([employer]) 12/20/2014  . HTN (hypertension) 11/04/2014  . Right arm pain 11/04/2014  . HTN, goal below 130/80 11/04/2014  . Cough 11/04/2014  . Chest pain 10/01/2014  . Vitamin D deficiency 08/26/2014  . Cramp in lower leg 08/26/2014  . Right thyroid nodule 07/22/2014  . Hyperparathyroidism ([place]) 07/22/2014  . Myeloproliferative disorder ([place]) 03/08/2013  . Leukocytosis 11/15/2012  . Chronic neutrophilia 11/15/2012  . Pruritus 11/15/2012  . Monocytosis 11/15/2012    Past Surgical History:  Procedure Laterality Date  . CARDIAC CATHETERIZATION N/A 10/03/2014   Procedure: Left Heart Cath and Coronary Angiography;  Surgeon: Lorretta Harp, MD;  Location: [facility];  Service: Cardiovascular;  Laterality: N/A;  . DILITATION & CURRETTAGE/HYSTROSCOPY WITH NOVASURE ABLATION N/A 11/08/2014   Procedure: DILATATION & CURETTAGE/HYSTEROSCOPY WITH NOVASURE ABLATION;  Surgeon: Linda Hedges, DO;  Location: [facility];  Service: Gynecology;  Laterality: N/A;  . EYE SURGERY     laser eye surgery  . LAPAROSCOPIC VAGINAL HYSTERECTOMY WITH SALPINGECTOMY Bilateral 03/05/2015   Procedure: LAPAROSCOPIC ASSISTED VAGINAL HYSTERECTOMY WITH SALPINGECTOMY;  Surgeon: Linda Hedges, DO;  Location: [facility];  Service: Gynecology;  Laterality: Bilateral;  . TUBAL LIGATION  2000    OB History    No data available       Home Medications    Prior to Admission medications   Medication Sig Start Date End Date Taking? Authorizing Provider  amLODipine (NORVASC) 5 MG tablet Take 1 tablet (5 mg total)  by mouth daily. 01/02/16  Yes Yvonne R Lowne Chase, DO  aspirin 81 MG tablet Take 81 mg by mouth daily.   Yes Historical Provider, MD  doxepin (SINEQUAN) 25 MG capsule Take 25 mg by mouth.   Yes Historical Provider, MD  levothyroxine (SYNTHROID, LEVOTHROID) 50 MCG tablet Take 1 tablet (50 mcg total) by mouth daily before breakfast. 03/10/16  Yes [name], MD    omeprazole (PRILOSEC) 20 MG capsule TAKE 1 CAPSULE (20 MG TOTAL) BY MOUTH DAILY. 01/26/16  Yes Yvonne R Lowne Chase, DO  PEGASYS 180 MCG/ML injection Inject 0.45 mLs (81 mcg total) into the skin every 14 (fourteen) days. 09/04/15  Yes Eliezer Bottom, NP    Family History Family History  Problem Relation Age of Onset  . Heart disease Mother   . Hypertension Mother   . Hyperlipidemia Mother   . Diabetes Father   . Cancer Father     esopageal ?  . Dementia Maternal Grandmother   . Heart disease Maternal Grandmother   . Heart disease Maternal Grandfather 1    MI  . Heart disease Cousin   . Other Neg Hx     hyperparathyroidism    Social History Social History  Substance Use Topics  . Smoking status: Former Smoker    Types: Cigarettes    Start date: 08/21/2014  . Smokeless tobacco: Never Used     Comment: patient reports light tobacco use, has not smoked since friday (09/27/14)  . Alcohol use 3.6 oz/week    6 Cans of beer per week     Allergies   Amitiza [lubiprostone]; Lisinopril; and Losartan   Review of Systems Review of Systems Ten systems reviewed and are negative for acute change, except as noted in the HPI.    Physical Exam Updated Vital Signs BP 115/74   Pulse 65   Temp 97.7 F (36.5 C) (Oral)   Resp 19   Ht 5' 6.5" (1.689 m)   Wt 109.8 kg   SpO2 98%   BMI 38.47 kg/m   Physical Exam  Constitutional: She is oriented to person, place, and time. She appears well-developed and well-nourished. No distress.  Nontoxic appearing and in NAD  HENT:  Head: Normocephalic and atraumatic.  Eyes: Conjunctivae and EOM are normal. No scleral icterus.  Neck: Normal range of motion.  Cardiovascular: Normal rate, regular rhythm and intact distal pulses.   Pulmonary/Chest: Effort normal. No respiratory distress. She has no wheezes. She has no rales.  Lungs CTAB. Chest expansion symmetric.  Abdominal: Soft. She exhibits no distension. There is no tenderness. There is  no guarding.  Soft, nontender abdomen.  Musculoskeletal: Normal range of motion.  No lower extremity pitting edema. No palpable cords. Compartments of BLE soft.  Neurological: She is alert and oriented to person, place, and time. She exhibits normal muscle tone. Coordination normal.  Skin: Skin is warm and dry. No rash noted. She is not diaphoretic. No erythema. No pallor.  Psychiatric: She has a normal mood and affect. Her behavior is normal.  Nursing note and vitals reviewed.    ED Treatments / Results  Labs (all labs ordered are listed, but only abnormal results are displayed) Labs Reviewed  CBC WITH DIFFERENTIAL/PLATELET - Abnormal; Notable for the following:       Result Value   WBC 20.7 (*)    Neutro Abs 15.8 (*)    All other components within normal limits  BASIC METABOLIC PANEL - Abnormal; Notable for the following:  Calcium 10.7 (*)    All other components within normal limits  TROPONIN I  TROPONIN I    EKG  EKG Interpretation  Date/Time:  Thursday April 15 2016 10:48:38 EST Ventricular Rate:  73 PR Interval:  150 QRS Duration: 88 QT Interval:  358 QTC Calculation: 394 R Axis:   37 Text Interpretation:  Normal sinus rhythm Normal ECG No significant change since last tracing Confirmed by Canary Brim  MD, [place] [phone]) on 04/15/2016 3:52:07 PM       Radiology Dg Chest 2 View  Result Date: 04/15/2016 CLINICAL DATA:  Chest pain EXAM: CHEST  2 VIEW COMPARISON:  12/03/2015 FINDINGS: Heart and mediastinal contours are within normal limits. No focal opacities or effusions. No acute bony abnormality. IMPRESSION: No active cardiopulmonary disease. Electronically Signed   By: Rolm Baptise M.D.   On: 04/15/2016 12:45   US Venous Img Lower Unilateral Left  Result Date: 04/15/2016 CLINICAL DATA:  46 year old female with left calf pain and swelling EXAM: LEFT LOWER EXTREMITY VENOUS DOPPLER ULTRASOUND TECHNIQUE: Gray-scale sonography with graded compression, as well as color  Doppler and duplex ultrasound were performed to evaluate the lower extremity deep venous systems from the level of the common femoral vein and including the common femoral, femoral, profunda femoral, popliteal and calf veins including the posterior tibial, peroneal and gastrocnemius veins when visible. The superficial great saphenous vein was also interrogated. Spectral Doppler was utilized to evaluate flow at rest and with distal augmentation maneuvers in the common femoral, femoral and popliteal veins. COMPARISON:  None. FINDINGS: Contralateral Common Femoral Vein: Respiratory phasicity is normal and symmetric with the symptomatic side. No evidence of thrombus. Normal compressibility. Common Femoral Vein: No evidence of thrombus. Normal compressibility, respiratory phasicity and response to augmentation. Saphenofemoral Junction: No evidence of thrombus. Normal compressibility and flow on color Doppler imaging. Profunda Femoral Vein: No evidence of thrombus. Normal compressibility and flow on color Doppler imaging. Femoral Vein: No evidence of thrombus. Normal compressibility, respiratory phasicity and response to augmentation. Popliteal Vein: No evidence of thrombus. Normal compressibility, respiratory phasicity and response to augmentation. Calf Veins: No evidence of thrombus. Normal compressibility and flow on color Doppler imaging. Superficial Great Saphenous Vein: No evidence of thrombus. Normal compressibility and flow on color Doppler imaging. Other Findings:  None. IMPRESSION: Sonographic survey left lower extremity negative for DVT. Signed, Dulcy Fanny. Earleen Newport, DO [facility] Radiology Electronically Signed   By: Corrie Mckusick D.O.   On: 04/15/2016 12:45    Procedures Procedures (including critical care time)  Medications Ordered in ED Medications - No data to display   Initial Impression / Assessment and Plan / ED Course  I have reviewed the triage  vital signs and the nursing notes.  Pertinent labs & imaging results that were available during my care of the patient were reviewed by me and considered in my medical decision making (see chart for details).     46 year old female presents to the emergency department for evaluation of chest pain. Chest pain is atypical of ACS. Patient with reassuring cardiac workup today. She has a history of a normal cardiac cath in 2016. Doubt cardiac etiology of symptoms. Patient also eating of pain to her left calf. This does not correlate with a DVT. Patient also at low risk for pulmonary embolus. She is PERC negative. Given lack of DVT on ultrasound and PERC negative without hypoxia or tachycardia, doubt PE today. Laboratory workup is consistent with baseline. Question whether symptoms may be  musculoskeletal in etiology. The patient has been advised to follow-up with a primary care doctor for recheck of symptoms. Return precautions given at discharge. Patient agreeable to plan with no unaddressed concerns; discharged in stable condition.   Final Clinical Impressions(s) / ED Diagnoses   Final diagnoses:  Nonspecific chest pain    New Prescriptions New Prescriptions   No medications on file     Antonietta Breach, PA-C 04/15/16 [place], MD 04/15/16 1555

## 2016-04-15 NOTE — ED Notes (Signed)
Unsuccessful iv attempt in the right ac x 1

## 2016-04-23 ENCOUNTER — Encounter: Payer: Self-pay | Admitting: Family Medicine

## 2016-04-23 ENCOUNTER — Ambulatory Visit (INDEPENDENT_AMBULATORY_CARE_PROVIDER_SITE_OTHER): Payer: Medicaid Other | Admitting: Family Medicine

## 2016-04-23 ENCOUNTER — Telehealth: Payer: Self-pay | Admitting: Family Medicine

## 2016-04-23 VITALS — BP 130/92 | HR 64 | Temp 97.4°F | Resp 16 | Ht 67.0 in | Wt 244.2 lb

## 2016-04-23 DIAGNOSIS — I10 Essential (primary) hypertension: Secondary | ICD-10-CM

## 2016-04-23 DIAGNOSIS — E041 Nontoxic single thyroid nodule: Secondary | ICD-10-CM

## 2016-04-23 DIAGNOSIS — E785 Hyperlipidemia, unspecified: Secondary | ICD-10-CM | POA: Diagnosis not present

## 2016-04-23 DIAGNOSIS — D471 Chronic myeloproliferative disease: Secondary | ICD-10-CM

## 2016-04-23 DIAGNOSIS — E0590 Thyrotoxicosis, unspecified without thyrotoxic crisis or storm: Secondary | ICD-10-CM | POA: Diagnosis not present

## 2016-04-23 LAB — COMPREHENSIVE METABOLIC PANEL
ALT: 6 U/L (ref 0–35)
AST: 9 U/L (ref 0–37)
Albumin: 4.2 g/dL (ref 3.5–5.2)
Alkaline Phosphatase: 115 U/L (ref 39–117)
BUN: 10 mg/dL (ref 6–23)
CALCIUM: 10.9 mg/dL — AB (ref 8.4–10.5)
CO2: 26 meq/L (ref 19–32)
Chloride: 102 mEq/L (ref 96–112)
Creatinine, Ser: 0.6 mg/dL (ref 0.40–1.20)
GFR: 113.37 mL/min (ref >60.00)
GLUCOSE: 63 mg/dL — AB (ref 70–99)
Potassium: 3.8 mEq/L (ref 3.5–5.1)
Sodium: 137 mEq/L (ref 135–145)
Total Bilirubin: 0.6 mg/dL (ref 0.2–1.2)
Total Protein: 7.6 g/dL (ref 6.0–8.3)

## 2016-04-23 LAB — LIPID PANEL
CHOL/HDL RATIO: 4
Cholesterol: 226 mg/dL — ABNORMAL HIGH (ref 0–200)
HDL: 52.9 mg/dL (ref >39.00)
LDL Cholesterol: 151 mg/dL — ABNORMAL HIGH (ref 0–99)
NONHDL: 173.48
TRIGLYCERIDES: 111 mg/dL (ref 0.0–149.0)
VLDL: 22.2 mg/dL (ref 0.0–40.0)

## 2016-04-23 LAB — MICROALBUMIN / CREATININE URINE RATIO
CREATININE, U: 8.8 mg/dL
MICROALB/CREAT RATIO: 7.9 mg/g (ref 0.0–30.0)
Microalb, Ur: <0.7 mg/dL (ref 0.0–1.9)

## 2016-04-23 LAB — TSH: TSH: 1.37 u[IU]/mL (ref 0.35–4.50)

## 2016-04-23 MED ORDER — LEVOTHYROXINE SODIUM 50 MCG PO TABS: 50.0000 ug | ORAL_TABLET | Freq: Every day | ORAL | 3 refills | Status: DC

## 2016-04-23 MED ORDER — AMLODIPINE BESYLATE 5 MG PO TABS: ORAL_TABLET | ORAL | 1 refills | Status: DC

## 2016-04-23 NOTE — Telephone Encounter (Signed)
Relation to [id number] Call back number:[phone]   Reason for call:  Patient in need of BP clarification regarding amLODipine (NORVASC) 5 MG tablet, patient not clear if BP medication was increased,  please lvm

## 2016-04-23 NOTE — Progress Notes (Addendum)
Subjective:    Patient ID: Brianna Fletcher, female    DOB: 1971-01-27, 46 y.o.   MRN: [id number]  Chief Complaint  Patient presents with  . Hospitalization Follow-up    HPI Patient is in today for hospital follow up for chest pain.  Chest pain has resolved Pt has not been seen in over a year because she lost her insurance.  She now has medicaid and is back for f/u.    Pt has had multiple problems over the last year.  She is still seeing Dr Marin Olp  Past Medical History:  Diagnosis Date  . Arthritis   . Blood dyscrasia    produces too many WBCs  . Chronic neutrophilia 11/15/2012  . Family history of heart disease    mother had stents placed in her 3s and had myocardial infarctions  . Glaucoma   . Hypertension   . Leukocytosis, unspecified 11/15/2012  . Monocytosis 11/15/2012  . Myeloproliferative disorder ([place]) 03/08/2013  . Precordial chest pain   . Pruritus 11/15/2012  . Radial artery occlusion, right (HCC)    status post  right radial artery cardiac catheterization  . Vaginal delivery 1986, 2000    Past Surgical History:  Procedure Laterality Date  . CARDIAC CATHETERIZATION N/A 10/03/2014   Procedure: Left Heart Cath and Coronary Angiography;  Surgeon: Lorretta Harp, MD;  Location: [facility];  Service: Cardiovascular;  Laterality: N/A;  . DILITATION & CURRETTAGE/HYSTROSCOPY WITH NOVASURE ABLATION N/A 11/08/2014   Procedure: DILATATION & CURETTAGE/HYSTEROSCOPY WITH NOVASURE ABLATION;  Surgeon: Linda Hedges, DO;  Location: [facility];  Service: Gynecology;  Laterality: N/A;  . EYE SURGERY     laser eye surgery  . LAPAROSCOPIC VAGINAL HYSTERECTOMY WITH SALPINGECTOMY Bilateral 03/05/2015   Procedure: LAPAROSCOPIC ASSISTED VAGINAL HYSTERECTOMY WITH SALPINGECTOMY;  Surgeon: Linda Hedges, DO;  Location: [facility];  Service: Gynecology;  Laterality: Bilateral;  . TUBAL LIGATION  2000    Family History  Problem Relation Age of Onset  . Heart disease Mother   . Hypertension  Mother   . Hyperlipidemia Mother   . Diabetes Father   . Cancer Father     esopageal ?  . Dementia Maternal Grandmother   . Heart disease Maternal Grandmother   . Heart disease Maternal Grandfather 70    MI  . Heart disease Cousin   . Other Neg Hx     hyperparathyroidism    Social History   Social History  . Marital status: Divorced    Spouse name: N/A  . Number of children: N/A  . Years of education: N/A   Occupational History  . Not on file.   Social History Main Topics  . Smoking status: Former Smoker    Types: Cigarettes    Start date: 08/21/2014  . Smokeless tobacco: Never Used     Comment: patient reports light tobacco use, has not smoked since friday (09/27/14)  . Alcohol use 3.6 oz/week    6 Cans of beer per week  . Drug use: No  . Sexual activity: Not Currently    Partners: Male   Other Topics Concern  . Not on file   Social History Narrative  . No narrative on file    Outpatient Medications Prior to Visit  Medication Sig Dispense Refill  . aspirin 81 MG tablet Take 81 mg by mouth daily.    Marland Kitchen doxepin (SINEQUAN) 25 MG capsule Take 25 mg by mouth.    Marland Kitchen omeprazole (PRILOSEC) 20 MG capsule TAKE 1 CAPSULE (  20 MG TOTAL) BY MOUTH DAILY. 30 capsule 11  . PEGASYS 180 MCG/ML injection Inject 0.45 mLs (81 mcg total) into the skin every 14 (fourteen) days. 2 mL 3  . amLODipine (NORVASC) 5 MG tablet Take 1 tablet (5 mg total) by mouth daily. 90 tablet 1  . levothyroxine (SYNTHROID, LEVOTHROID) 50 MCG tablet Take 1 tablet (50 mcg total) by mouth daily before breakfast. 30 tablet 3   Facility-Administered Medications Prior to Visit  Medication Dose Route Frequency Provider Last Rate Last Dose  . acetaminophen (TYLENOL) tablet 650 mg  650 mg Oral Once [name], MD      . peginterferon alfa-2a (PEGASYS) injection 81 mcg  81 mcg Subcutaneous Weekly [name], MD   81 mcg at 04/26/14 1530    Allergies  Allergen Reactions  . Amitiza [Lubiprostone]     Made  her sick  . Lisinopril Cough  . Losartan Cough    Review of Systems  Constitutional: Negative for fever and malaise/fatigue.  HENT: Negative for congestion.   Eyes: Negative for blurred vision.  Respiratory: Negative for cough and shortness of breath.   Cardiovascular: Negative for chest pain, palpitations and leg swelling.  Gastrointestinal: Negative for vomiting.  Musculoskeletal: Positive for joint pain. Negative for back pain.  Skin: Negative for rash.  Neurological: Negative for loss of consciousness and headaches.       Objective:    Physical Exam  Constitutional: She is oriented to person, place, and time. She appears well-developed and well-nourished.  HENT:  Head: Normocephalic and atraumatic.  Eyes: Conjunctivae and EOM are normal.  Neck: Normal range of motion. Neck supple. No JVD present. Carotid bruit is not present. No thyromegaly present.  Cardiovascular: Normal rate, regular rhythm and normal heart sounds.   No murmur heard. Pulmonary/Chest: Effort normal and breath sounds normal. No respiratory distress. She has no wheezes. She has no rales. She exhibits no tenderness.  Musculoskeletal: She exhibits no edema.  Neurological: She is alert and oriented to person, place, and time.  Psychiatric: She has a normal mood and affect. Her behavior is normal. Judgment and thought content normal.  Nursing note and vitals reviewed.   BP (!) 130/92 (BP Location: Left Arm, Patient Position: Sitting, Cuff Size: Large)   Pulse 64   Temp 97.4 F (36.3 C) (Oral)   Resp 16   Ht _0  (1.702 m)   Wt 244 lb 3.2 oz (110.8 kg)   SpO2 98%   BMI 38.25 kg/m  Wt Readings from Last 3 Encounters:  04/23/16 244 lb 3.2 oz (110.8 kg)  04/15/16 242 lb (109.8 kg)  04/12/16 242 lb (109.8 kg)     Lab Results  Component Value Date   WBC 20.7 (H) 04/15/2016   HGB 15.0 04/15/2016   HCT 45.3 04/15/2016   PLT 284 04/15/2016   GLUCOSE 63 (L) 04/23/2016   CHOL 226 (H) 04/23/2016    TRIG 111.0 04/23/2016   HDL 52.90 04/23/2016   LDLCALC 151 (H) 04/23/2016   ALT 6 04/23/2016   AST 9 04/23/2016   NA 137 04/23/2016   K 3.8 04/23/2016   CL 102 04/23/2016   CREATININE 0.60 04/23/2016   BUN 10 04/23/2016   CO2 26 04/23/2016   TSH 1.37 04/23/2016   INR 0.91 10/01/2014   MICROALBUR <0.7 04/23/2016    Lab Results  Component Value Date   TSH 1.37 04/23/2016   Lab Results  Component Value Date   WBC 20.7 (H) 04/15/2016  HGB 15.0 04/15/2016   HCT 45.3 04/15/2016   MCV 95.2 04/15/2016   PLT 284 04/15/2016   Lab Results  Component Value Date   NA 137 04/23/2016   K 3.8 04/23/2016   CHLORIDE 102 03/10/2016   CO2 26 04/23/2016   GLUCOSE 63 (L) 04/23/2016   BUN 10 04/23/2016   CREATININE 0.60 04/23/2016   BILITOT 0.6 04/23/2016   ALKPHOS 115 04/23/2016   AST 9 04/23/2016   ALT 6 04/23/2016   PROT 7.6 04/23/2016   ALBUMIN 4.2 04/23/2016   CALCIUM 10.9 (H) 04/23/2016   ANIONGAP 7 04/15/2016   EGFR >90 03/10/2016   GFR 113.37 04/23/2016   Lab Results  Component Value Date   CHOL 226 (H) 04/23/2016   Lab Results  Component Value Date   HDL 52.90 04/23/2016   Lab Results  Component Value Date   LDLCALC 151 (H) 04/23/2016   Lab Results  Component Value Date   TRIG 111.0 04/23/2016   Lab Results  Component Value Date   CHOLHDL 4 04/23/2016   No results found for: HGBA1C     Assessment & Plan:   Problem List Items Addressed This Visit      Unprioritized   Right thyroid nodule   Relevant Medications   levothyroxine (SYNTHROID, LEVOTHROID) 50 MCG tablet   HTN (hypertension) - Primary    Elevated-- inc norvasc to 10  Mg qd  rto 2 weeks recheck bp      Relevant Medications   amLODipine (NORVASC) 10 MG tablet   Other Relevant Orders   Comprehensive metabolic panel (Completed)   Urine Microalbumin w/creat. ratio (Completed)   Myeloproliferative disorder (HCC) (Chronic)    Per hematology      Relevant Medications   levothyroxine  (SYNTHROID, LEVOTHROID) 50 MCG tablet    Other Visit Diagnoses    Hyperthyroidism       Relevant Medications   levothyroxine (SYNTHROID, LEVOTHROID) 50 MCG tablet   Other Relevant Orders   TSH (Completed)   Hyperlipidemia, unspecified hyperlipidemia type       Relevant Medications   amLODipine (NORVASC) 10 MG tablet   Other Relevant Orders   Lipid panel (Completed)      I have discontinued Ms. Leverett's amLODipine, amLODipine, and amLODipine. I am also having her start on amLODipine. Additionally, I am having her maintain her aspirin, PEGASYS, doxepin, omeprazole, and levothyroxine.  Meds ordered this encounter  Medications  . DISCONTD: amLODipine (NORVASC) 5 MG tablet    Sig: Take 1 tablet PO daily    Dispense:  90 tablet    Refill:  1  . levothyroxine (SYNTHROID, LEVOTHROID) 50 MCG tablet    Sig: Take 1 tablet (50 mcg total) by mouth daily before breakfast.    Dispense:  30 tablet    Refill:  3  . DISCONTD: amLODipine (NORVASC) 5 MG tablet    Sig: Take 1 tablet PO daily    Dispense:  90 tablet    Refill:  1  . amLODipine (NORVASC) 10 MG tablet    Sig: Take 1 tablet (10 mg total) by mouth daily.    Dispense:  30 tablet    Refill:  2   CMA served as scribe during this visit. History, Physical and Plan performed by medical provider. Documentation and orders reviewed and attested to.     Ann Held, DO

## 2016-04-23 NOTE — Progress Notes (Signed)
Pre visit review using our clinic review tool, if applicable. No additional management support is needed unless otherwise documented below in the visit note. 

## 2016-04-23 NOTE — Patient Instructions (Signed)
Chest Wall Pain °Introduction °Chest wall pain is pain in or around the bones and muscles of your chest. Sometimes, an injury causes this pain. Sometimes, the cause may not be known. This pain may take several weeks or longer to get better. °Follow these instructions at home: °Pay attention to any changes in your symptoms. Take these actions to help with your pain: °· Rest as told by your doctor. °· Avoid activities that cause pain. Try not to use your chest, belly (abdominal), or side muscles to lift heavy things. °· If directed, apply ice to the painful area: °¨ Put ice in a plastic bag. °¨ Place a towel between your skin and the bag. °¨ Leave the ice on for 20 minutes, 2-3 times per day. °· Take over-the-counter and prescription medicines only as told by your doctor. °· Do not use tobacco products, including cigarettes, chewing tobacco, and e-cigarettes. If you need help quitting, ask your doctor. °· Keep all follow-up visits as told by your doctor. This is important. °Contact a doctor if: °· You have a fever. °· Your chest pain gets worse. °· You have new symptoms. °Get help right away if: °· You feel sick to your stomach (nauseous) or you throw up (vomit). °· You feel sweaty or light-headed. °· You have a cough with phlegm (sputum) or you cough up blood. °· You are short of breath. °This information is not intended to replace advice given to you by your health care provider. Make sure you discuss any questions you have with your health care provider. °Document Released: 08/25/2007 Document Revised: 08/14/2015 Document Reviewed: 06/03/2014 °© 2017 Elsevier ° °

## 2016-04-24 MED ORDER — AMLODIPINE BESYLATE 10 MG PO TABS: 10.0000 mg | ORAL_TABLET | Freq: Every day | ORAL | 2 refills | Status: DC

## 2016-04-24 NOTE — Addendum Note (Signed)
Addended by: Roma Schanz R on: 04/24/2016 12:02 PM   Modules accepted: Orders

## 2016-04-24 NOTE — Assessment & Plan Note (Signed)
Elevated-- inc norvasc to 10  Mg qd  rto 2 weeks recheck bp

## 2016-04-24 NOTE — Assessment & Plan Note (Signed)
Per hematology 

## 2016-04-26 NOTE — Telephone Encounter (Signed)
Clarified that she is to be on Amlodipine 10 per PCP instructions on her OV notes and recheck in 2 weeks with a BP Check.  The problem was that the pharmacy had gotten a refill for the amlodipine 5's and that is what they had when she got to the pharmacy that afternoon. I did clarify based on PCP OV notes/Current medication list that both clearly states she is to take/is on amlodipine 10 mg daily.  The patient verbally understood and stated until she gets the amlodipine 10 mg will just take 2 5's.

## 2016-04-30 IMAGING — CR DG CHEST 2V
2 series · 2 of 2 positions shown · non-contrast
Comparison: Chest x-ray of 08/22/2012

CLINICAL DATA: Preop for cardiac catheterization, chest pain, arm
pain, abnormal stress test

EXAM:
CHEST  2 VIEW

[w chest pa]
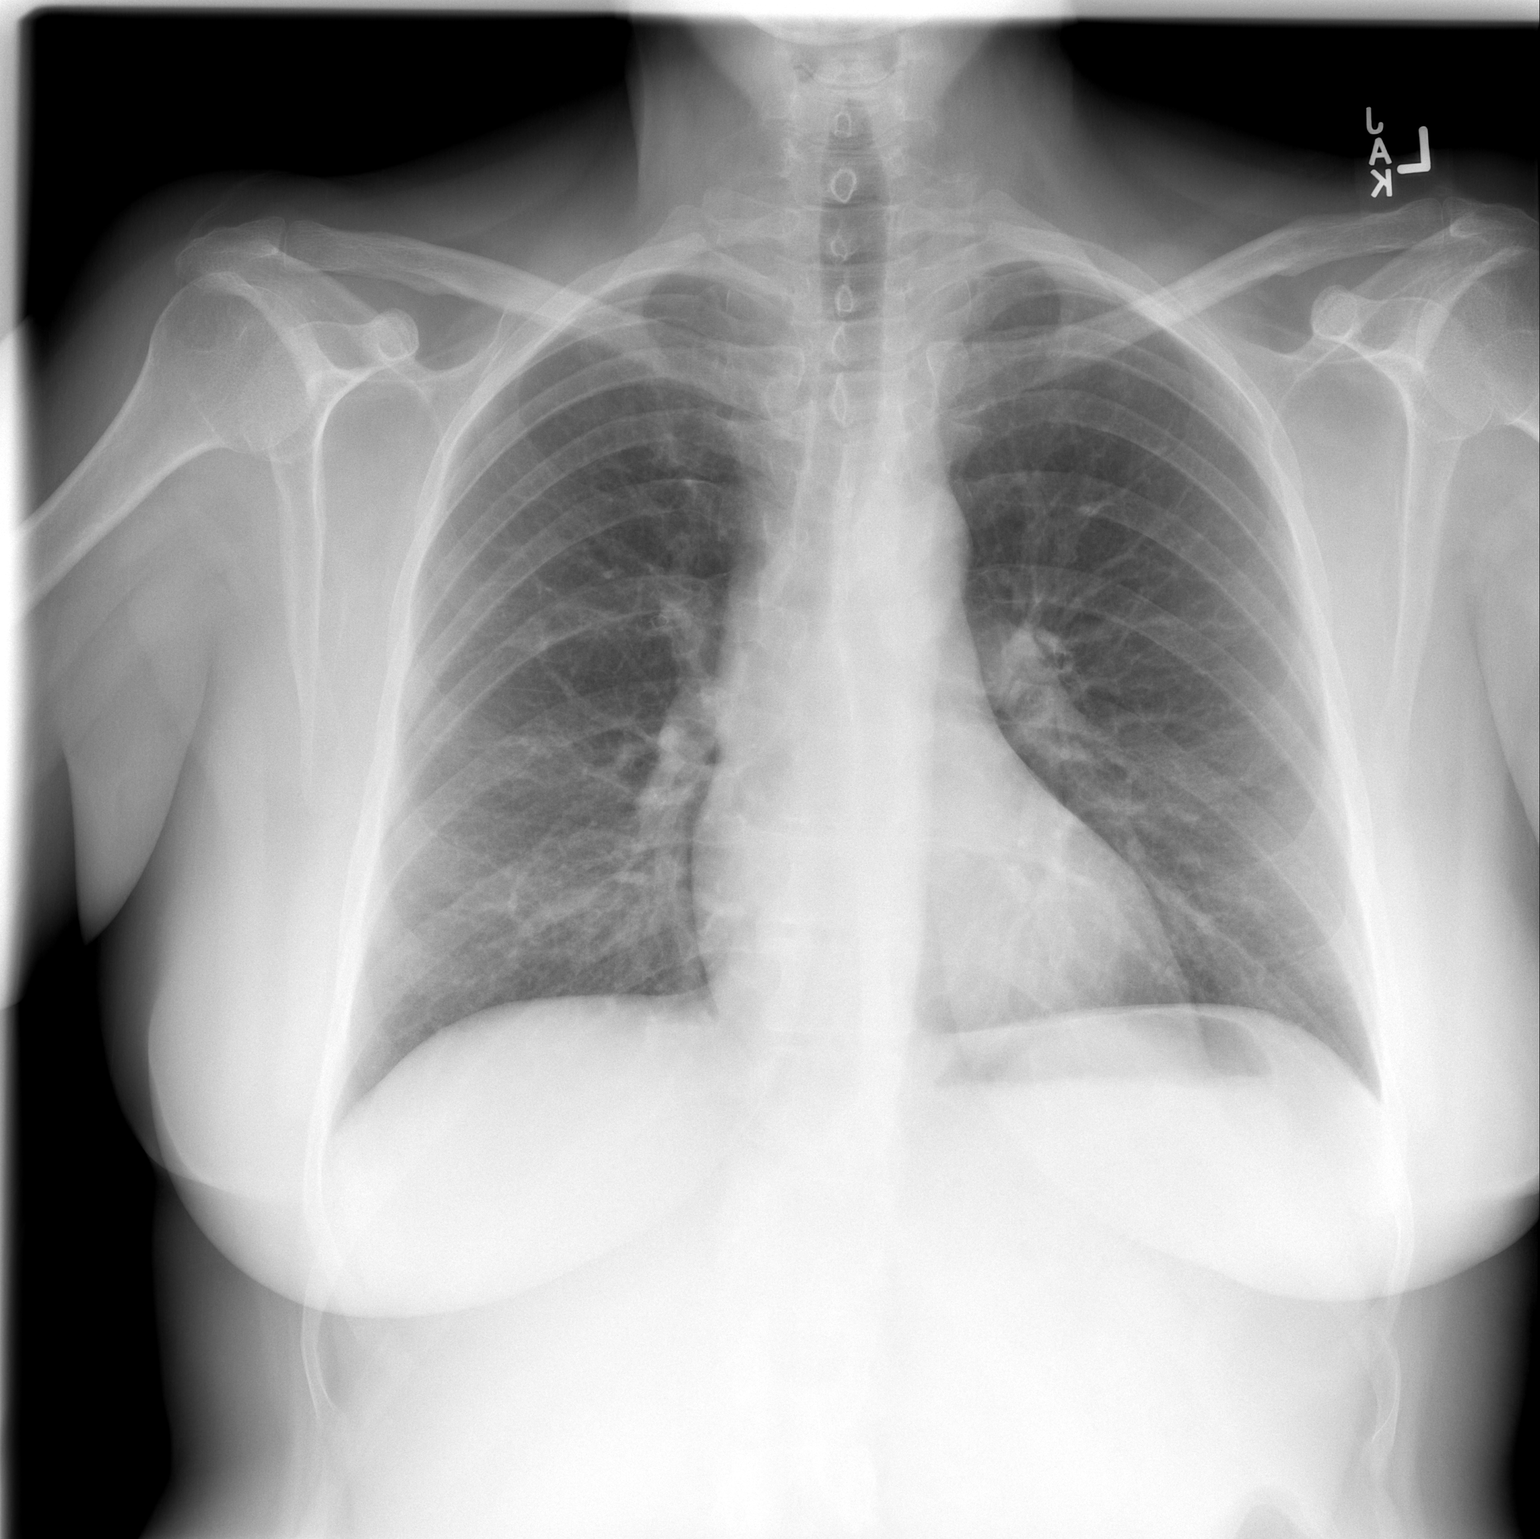

[w chest lat]
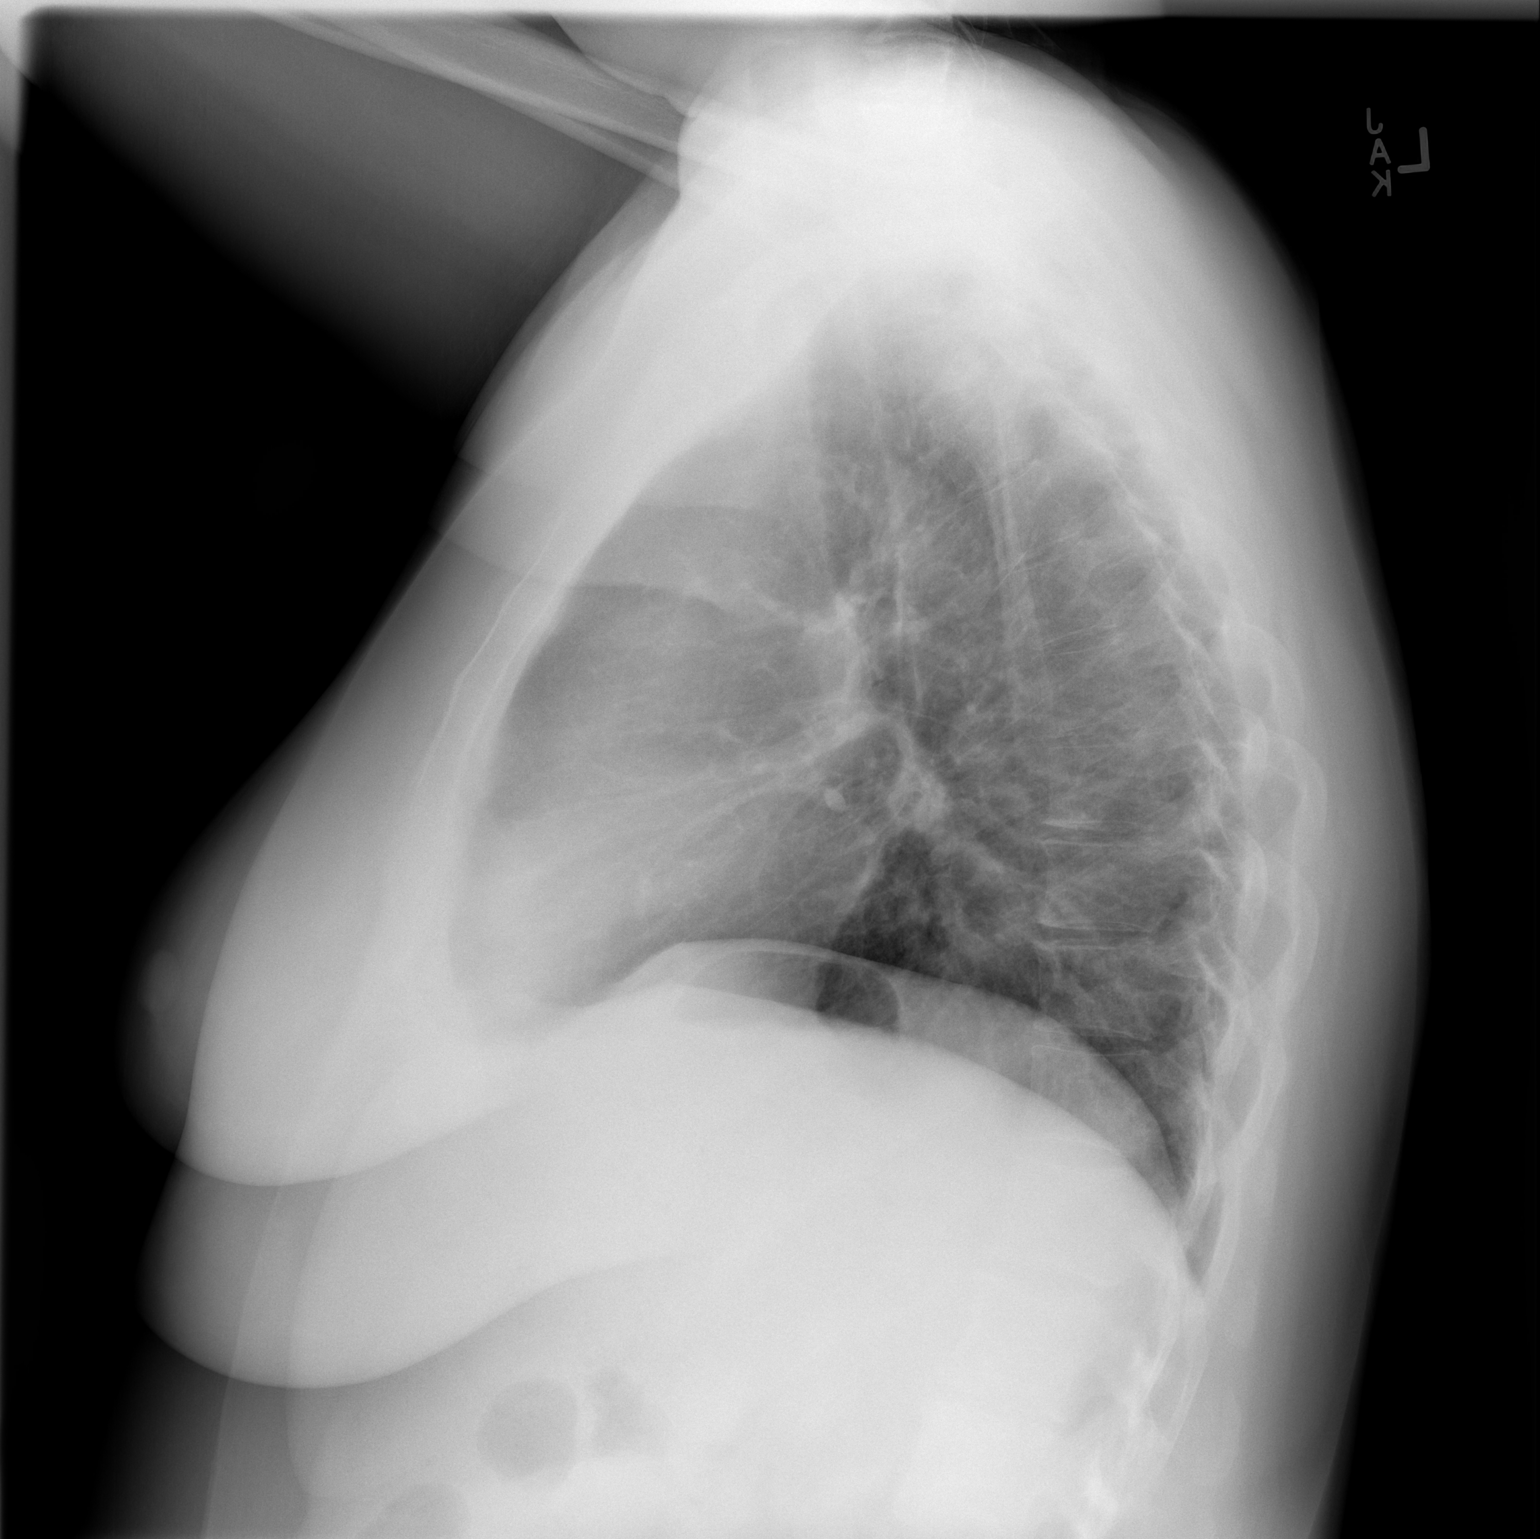

[2 of 2 positions shown; findings below may reference images not displayed]

FINDINGS: No active infiltrate or effusion is seen. Mediastinal and hilar
contours are unremarkable. The heart is within normal limits in
size. No bony abnormality is seen.
IMPRESSION: No active cardiopulmonary disease.

## 2016-05-06 ENCOUNTER — Ambulatory Visit (INDEPENDENT_AMBULATORY_CARE_PROVIDER_SITE_OTHER): Payer: Medicaid Other | Admitting: Family Medicine

## 2016-05-06 ENCOUNTER — Encounter: Payer: Self-pay | Admitting: Family Medicine

## 2016-05-06 ENCOUNTER — Telehealth: Payer: Self-pay | Admitting: Family Medicine

## 2016-05-06 VITALS — BP 128/82 | HR 72 | Temp 97.7°F | Resp 16 | Ht 67.0 in | Wt 244.2 lb

## 2016-05-06 DIAGNOSIS — R1012 Left upper quadrant pain: Secondary | ICD-10-CM

## 2016-05-06 DIAGNOSIS — M25551 Pain in right hip: Secondary | ICD-10-CM

## 2016-05-06 DIAGNOSIS — I10 Essential (primary) hypertension: Secondary | ICD-10-CM | POA: Diagnosis not present

## 2016-05-06 DIAGNOSIS — T148XXA Other injury of unspecified body region, initial encounter: Secondary | ICD-10-CM

## 2016-05-06 DIAGNOSIS — M2550 Pain in unspecified joint: Secondary | ICD-10-CM

## 2016-05-06 LAB — CBC WITH DIFFERENTIAL/PLATELET
BASOS PCT: 0.2 % (ref 0.0–3.0)
Basophils Absolute: 0 10*3/uL (ref 0.0–0.1)
EOS PCT: 0.4 % (ref 0.0–5.0)
Eosinophils Absolute: 0.1 10*3/uL (ref 0.0–0.7)
HCT: 42.2 % (ref 36.0–46.0)
Hemoglobin: 14 g/dL (ref 12.0–15.0)
LYMPHS ABS: 2.3 10*3/uL (ref 0.7–4.0)
Lymphocytes Relative: 12.2 % (ref 12.0–46.0)
MCHC: 33.1 g/dL (ref 30.0–36.0)
MCV: 93.6 fl (ref 78.0–100.0)
MONOS PCT: 5.4 % (ref 3.0–12.0)
Monocytes Absolute: 1 10*3/uL (ref 0.1–1.0)
NEUTROS ABS: 15.5 10*3/uL — AB (ref 1.4–7.7)
NEUTROS PCT: 81.8 % — AB (ref 43.0–77.0)
PLATELETS: 286 10*3/uL (ref 150.0–400.0)
RBC: 4.51 Mil/uL (ref 3.87–5.11)
RDW: 12.9 % (ref 11.5–15.5)
WBC: 19 10*3/uL (ref 4.0–10.5)

## 2016-05-06 MED ORDER — HYDROCHLOROTHIAZIDE 25 MG PO TABS: 25.0000 mg | ORAL_TABLET | Freq: Every day | ORAL | 1 refills | Status: DC

## 2016-05-06 NOTE — Telephone Encounter (Signed)
Pt sees hematology  

## 2016-05-06 NOTE — Patient Instructions (Signed)
Hypertension Hypertension, commonly called high blood pressure, is when the force of blood pumping through your arteries is too strong. Your arteries are the blood vessels that carry blood from your heart throughout your body. A blood pressure reading consists of a higher number over a lower number, such as 110/72. The higher number (systolic) is the pressure inside your arteries when your heart pumps. The lower number (diastolic) is the pressure inside your arteries when your heart relaxes. Ideally you want your blood pressure below 120/80. Hypertension forces your heart to work harder to pump blood. Your arteries may become narrow or stiff. Having untreated or uncontrolled hypertension can cause heart attack, stroke, kidney disease, and other problems. What increases the risk? Some risk factors for high blood pressure are controllable. Others are not. Risk factors you cannot control include:  Race. You may be at higher risk if you are African American.  Age. Risk increases with age.  Gender. Men are at higher risk than women before age 45 years. After age 65, women are at higher risk than men. Risk factors you can control include:  Not getting enough exercise or physical activity.  Being overweight.  Getting too much fat, sugar, calories, or salt in your diet.  Drinking too much alcohol. What are the signs or symptoms? Hypertension does not usually cause signs or symptoms. Extremely high blood pressure (hypertensive crisis) may cause headache, anxiety, shortness of breath, and nosebleed. How is this diagnosed? To check if you have hypertension, your health care provider will measure your blood pressure while you are seated, with your arm held at the level of your heart. It should be measured at least twice using the same arm. Certain conditions can cause a difference in blood pressure between your right and left arms. A blood pressure reading that is higher than normal on one occasion does  not mean that you need treatment. If it is not clear whether you have high blood pressure, you may be asked to return on a different day to have your blood pressure checked again. Or, you may be asked to monitor your blood pressure at home for 1 or more weeks. How is this treated? Treating high blood pressure includes making lifestyle changes and possibly taking medicine. Living a healthy lifestyle can help lower high blood pressure. You may need to change some of your habits. Lifestyle changes may include:  Following the DASH diet. This diet is high in fruits, vegetables, and whole grains. It is low in salt, red meat, and added sugars.  Keep your sodium intake below 2,300 mg per day.  Getting at least 30-45 minutes of aerobic exercise at least 4 times per week.  Losing weight if necessary.  Not smoking.  Limiting alcoholic beverages.  Learning ways to reduce stress. Your health care provider may prescribe medicine if lifestyle changes are not enough to get your blood pressure under control, and if one of the following is true:  You are 18-59 years of age and your systolic blood pressure is above 140.  You are 60 years of age or older, and your systolic blood pressure is above 150.  Your diastolic blood pressure is above 90.  You have diabetes, and your systolic blood pressure is over 140 or your diastolic blood pressure is over 90.  You have kidney disease and your blood pressure is above 140/90.  You have heart disease and your blood pressure is above 140/90. Your personal target blood pressure may vary depending on your medical   conditions, your age, and other factors. Follow these instructions at home:  Have your blood pressure rechecked as directed by your health care provider.  Take medicines only as directed by your health care provider. Follow the directions carefully. Blood pressure medicines must be taken as prescribed. The medicine does not work as well when you skip  doses. Skipping doses also puts you at risk for problems.  Do not smoke.  Monitor your blood pressure at home as directed by your health care provider. Contact a health care provider if:  You think you are having a reaction to medicines taken.  You have recurrent headaches or feel dizzy.  You have swelling in your ankles.  You have trouble with your vision. Get help right away if:  You develop a severe headache or confusion.  You have unusual weakness, numbness, or feel faint.  You have severe chest or abdominal pain.  You vomit repeatedly.  You have trouble breathing. This information is not intended to replace advice given to you by your health care provider. Make sure you discuss any questions you have with your health care provider. Document Released: 03/08/2005 Document Revised: 08/14/2015 Document Reviewed: 12/29/2012 Elsevier Interactive Patient Education  2017 Elsevier Inc.  

## 2016-05-06 NOTE — Progress Notes (Signed)
Subjective:    Patient ID: Brianna Fletcher, female    DOB: 1970-10-25, 46 y.o.   MRN: [id number]   I acted as a Education administrator for Dr. Royden Purl, LPN    Chief Complaint  Patient presents with  . Hypertension    Hypertension  This is a chronic problem. The current episode started more than 1 year ago. The problem is uncontrolled. Pertinent negatives include no blurred vision, chest pain, headaches or palpitations.    Patient is in today for follow up for hypertension. Patient c/o pain left side of abdomen under the breast. Patient report she has had this pain this pain off and on since seeing Dr. Tresa Res 3 years ago. Patient concerned it is getting worse. Patient also c/o right hip pain and would like to be referred to an orthopedic. Patient would like a referral to see a nutritionist.       Past Medical History:  Diagnosis Date  . Arthritis   . Blood dyscrasia    produces too many WBCs  . Chronic neutrophilia 11/15/2012  . Family history of heart disease    mother had stents placed in her 30s and had myocardial infarctions  . Glaucoma   . Hypertension   . Leukocytosis, unspecified 11/15/2012  . Monocytosis 11/15/2012  . Myeloproliferative disorder ([place]) 03/08/2013  . Precordial chest pain   . Pruritus 11/15/2012  . Radial artery occlusion, right (HCC)    status post  right radial artery cardiac catheterization  . Vaginal delivery 1986, 2000    Past Surgical History:  Procedure Laterality Date  . CARDIAC CATHETERIZATION N/A 10/03/2014   Procedure: Left Heart Cath and Coronary Angiography;  Surgeon: Lorretta Harp, MD;  Location: [facility];  Service: Cardiovascular;  Laterality: N/A;  . DILITATION & CURRETTAGE/HYSTROSCOPY WITH NOVASURE ABLATION N/A 11/08/2014   Procedure: DILATATION & CURETTAGE/HYSTEROSCOPY WITH NOVASURE ABLATION;  Surgeon: Linda Hedges, DO;  Location: [facility];  Service: Gynecology;  Laterality: N/A;  . EYE SURGERY     laser eye surgery  .  LAPAROSCOPIC VAGINAL HYSTERECTOMY WITH SALPINGECTOMY Bilateral 03/05/2015   Procedure: LAPAROSCOPIC ASSISTED VAGINAL HYSTERECTOMY WITH SALPINGECTOMY;  Surgeon: Linda Hedges, DO;  Location: [facility];  Service: Gynecology;  Laterality: Bilateral;  . TUBAL LIGATION  2000    Family History  Problem Relation Age of Onset  . Heart disease Mother   . Hypertension Mother   . Hyperlipidemia Mother   . Diabetes Father   . Cancer Father     esopageal ?  . Dementia Maternal Grandmother   . Heart disease Maternal Grandmother   . Heart disease Maternal Grandfather 71    MI  . Heart disease Cousin   . Other Neg Hx     hyperparathyroidism    Social History   Social History  . Marital status: Divorced    Spouse name: N/A  . Number of children: N/A  . Years of education: N/A   Occupational History  . Not on file.   Social History Main Topics  . Smoking status: Former Smoker    Types: Cigarettes    Start date: 08/21/2014  . Smokeless tobacco: Never Used     Comment: patient reports light tobacco use, has not smoked since friday (09/27/14)  . Alcohol use 3.6 oz/week    6 Cans of beer per week  . Drug use: No  . Sexual activity: Not Currently    Partners: Male   Other Topics Concern  . Not on file  Social History Narrative  . No narrative on file    Outpatient Medications Prior to Visit  Medication Sig Dispense Refill  . amLODipine (NORVASC) 10 MG tablet Take 1 tablet (10 mg total) by mouth daily. 30 tablet 2  . aspirin 81 MG tablet Take 81 mg by mouth daily.    Marland Kitchen doxepin (SINEQUAN) 25 MG capsule Take 25 mg by mouth.    . levothyroxine (SYNTHROID, LEVOTHROID) 50 MCG tablet Take 1 tablet (50 mcg total) by mouth daily before breakfast. 30 tablet 3  . omeprazole (PRILOSEC) 20 MG capsule TAKE 1 CAPSULE (20 MG TOTAL) BY MOUTH DAILY. 30 capsule 11  . PEGASYS 180 MCG/ML injection Inject 0.45 mLs (81 mcg total) into the skin every 14 (fourteen) days. 2 mL 3   Facility-Administered  Medications Prior to Visit  Medication Dose Route Frequency Provider Last Rate Last Dose  . acetaminophen (TYLENOL) tablet 650 mg  650 mg Oral Once [name], MD      . peginterferon alfa-2a (PEGASYS) injection 81 mcg  81 mcg Subcutaneous Weekly [name], MD   81 mcg at 04/26/14 1530    Allergies  Allergen Reactions  . Amitiza [Lubiprostone]     Made her sick  . Lisinopril Cough  . Losartan Cough    Review of Systems  Constitutional: Negative for fever.  HENT: Negative for congestion.   Eyes: Negative for blurred vision.  Respiratory: Negative for cough.   Cardiovascular: Negative for chest pain and palpitations.  Gastrointestinal: Positive for abdominal pain. Negative for blood in stool, constipation, diarrhea, heartburn, melena, nausea and vomiting.  Musculoskeletal: Negative for back pain.  Skin: Negative for rash.  Neurological: Negative for loss of consciousness and headaches.  Endo/Heme/Allergies: Negative for environmental allergies and polydipsia. Bruises/bleeds easily.       Objective:    Physical Exam  Constitutional: She is oriented to person, place, and time. She appears well-developed and well-nourished. No distress.  HENT:  Head: Normocephalic and atraumatic.  Eyes: Conjunctivae are normal. Pupils are equal, round, and reactive to light.  Neck: Normal range of motion. No thyromegaly present.  Cardiovascular: Normal rate and regular rhythm.   Pulmonary/Chest: Effort normal and breath sounds normal. She has no wheezes.  Abdominal: Soft. Bowel sounds are normal. She exhibits no distension and no mass. There is tenderness in the left upper quadrant. There is no rigidity, no rebound and no guarding.  Musculoskeletal: Normal range of motion. She exhibits tenderness. She exhibits no edema or deformity.  Neurological: She is alert and oriented to person, place, and time.  Skin: Skin is warm and dry. She is not diaphoretic.  Psychiatric: She has a normal  mood and affect.    BP 128/82 (BP Location: Left Arm, Patient Position: Sitting, Cuff Size: Large)   Pulse 72   Temp 97.7 F (36.5 C) (Oral)   Resp 16   Ht '5\' 7"'  (1.702 m)   Wt 244 lb 3.2 oz (110.8 kg)   SpO2 98%   BMI 38.25 kg/m  Wt Readings from Last 3 Encounters:  05/06/16 244 lb 3.2 oz (110.8 kg)  04/23/16 244 lb 3.2 oz (110.8 kg)  04/15/16 242 lb (109.8 kg)     Lab Results  Component Value Date   WBC 19.0 Repeated and verified X2. (HH) 05/06/2016   HGB 14.0 05/06/2016   HCT 42.2 05/06/2016   PLT 286.0 05/06/2016   GLUCOSE 63 (L) 04/23/2016   CHOL 226 (H) 04/23/2016   TRIG 111.0 04/23/2016  HDL 52.90 04/23/2016   LDLCALC 151 (H) 04/23/2016   ALT 6 04/23/2016   AST 9 04/23/2016   NA 137 04/23/2016   K 3.8 04/23/2016   CL 102 04/23/2016   CREATININE 0.60 04/23/2016   BUN 10 04/23/2016   CO2 26 04/23/2016   TSH 1.37 04/23/2016   INR 0.9 05/06/2016   MICROALBUR <0.7 04/23/2016    Lab Results  Component Value Date   TSH 1.37 04/23/2016   Lab Results  Component Value Date   WBC 19.0 Repeated and verified X2. (HH) 05/06/2016   HGB 14.0 05/06/2016   HCT 42.2 05/06/2016   MCV 93.6 05/06/2016   PLT 286.0 05/06/2016   Lab Results  Component Value Date   NA 137 04/23/2016   K 3.8 04/23/2016   CHLORIDE 102 03/10/2016   CO2 26 04/23/2016   GLUCOSE 63 (L) 04/23/2016   BUN 10 04/23/2016   CREATININE 0.60 04/23/2016   BILITOT 0.6 04/23/2016   ALKPHOS 115 04/23/2016   AST 9 04/23/2016   ALT 6 04/23/2016   PROT 7.6 04/23/2016   ALBUMIN 4.2 04/23/2016   CALCIUM 10.9 (H) 04/23/2016   ANIONGAP 7 04/15/2016   EGFR >90 03/10/2016   GFR 113.37 04/23/2016   Lab Results  Component Value Date   CHOL 226 (H) 04/23/2016   Lab Results  Component Value Date   HDL 52.90 04/23/2016   Lab Results  Component Value Date   LDLCALC 151 (H) 04/23/2016   Lab Results  Component Value Date   TRIG 111.0 04/23/2016   Lab Results  Component Value Date   CHOLHDL 4  04/23/2016   No results found for: HGBA1C     Assessment & Plan:   Problem List Items Addressed This Visit      Unprioritized   Morbid obesity ([place])   Relevant Orders   Amb ref to Medical Nutrition Therapy-MNT   HTN (hypertension) - Primary   Relevant Medications   hydrochlorothiazide (HYDRODIURIL) 25 MG tablet   Bruising    Check labs F/u hematology      Relevant Orders   CBC w/Diff (Completed)   PT AND PTT (Completed)   LUQ pain    LUQ tenderness Check labs and [place] abd       Relevant Orders   US Abdomen Complete (Completed)    Other Visit Diagnoses    Right hip pain       Relevant Orders   AMB referral to orthopedics   Arthralgia of multiple sites       Relevant Orders   Ambulatory referral to Rheumatology      I am having Ms. Fuhs start on hydrochlorothiazide. I am also having her maintain her aspirin, PEGASYS, doxepin, omeprazole, levothyroxine, and amLODipine.  Meds ordered this encounter  Medications  . hydrochlorothiazide (HYDRODIURIL) 25 MG tablet    Sig: Take 1 tablet (25 mg total) by mouth daily.    Dispense:  90 tablet    Refill:  1    CMA served as Education administrator during this visit. History, Physical and Plan performed by medical provider. Documentation and orders reviewed and attested to.  Ann Held, DO Patient ID: Brianna Fletcher, female   DOB: 05-13-1970, 46 y.o.   MRN: [id number]

## 2016-05-06 NOTE — Telephone Encounter (Signed)
CRITICAL VALUE STICKER  CRITICAL VALUE:  WBC  19.0  RECEIVER (on-site recipient of call): [name] NOTIFIED: 05/06/2016 2:08 pm  MESSENGER (representative from lab):  Santiago Glad at the [facility]  MD NOTIFIED:   Dr. Carollee Herter  TIME OF NOTIFICATION:  2:10 pm  RESPONSE:

## 2016-05-06 NOTE — Progress Notes (Signed)
Pre visit review using our clinic review tool, if applicable. No additional management support is needed unless otherwise documented below in the visit note. 

## 2016-05-07 ENCOUNTER — Telehealth: Payer: Self-pay | Admitting: Family Medicine

## 2016-05-07 LAB — PT AND PTT
INR: 0.9 (ref 0.8–1.2)
Prothrombin Time: 10.1 s (ref 9.1–12.0)
aPTT: 25 s (ref 24–33)

## 2016-05-07 NOTE — Telephone Encounter (Signed)
Her wbc is stable and pt, ptt is normal  She should f/u with Dr Marin Olp

## 2016-05-07 NOTE — Telephone Encounter (Signed)
Patient informed of PCP instructions. 

## 2016-05-07 NOTE — Telephone Encounter (Signed)
Patient called in this am regarding bruising.  She was seen by PCP on 05/06/16 so PCP has seen bruising.  Last night she found out a family member passed away from a blood clot and she has bruises all over her as this patient now does. So she is now concerned, could this be the problem

## 2016-05-09 ENCOUNTER — Ambulatory Visit (HOSPITAL_BASED_OUTPATIENT_CLINIC_OR_DEPARTMENT_OTHER)
Admission: RE | Admit: 2016-05-09 | Discharge: 2016-05-09 | Disposition: A | Discharge status: Home / Self Care | Payer: Medicaid Other | Source: Ambulatory Visit | Attending: Family Medicine | Admitting: Family Medicine

## 2016-05-09 DIAGNOSIS — N281 Cyst of kidney, acquired: Secondary | ICD-10-CM | POA: Insufficient documentation

## 2016-05-09 DIAGNOSIS — R1012 Left upper quadrant pain: Secondary | ICD-10-CM | POA: Insufficient documentation

## 2016-05-09 DIAGNOSIS — T148XXA Other injury of unspecified body region, initial encounter: Secondary | ICD-10-CM | POA: Insufficient documentation

## 2016-05-09 DIAGNOSIS — K769 Liver disease, unspecified: Secondary | ICD-10-CM | POA: Diagnosis not present

## 2016-05-09 NOTE — Assessment & Plan Note (Signed)
LUQ tenderness Check labs and [place] abd

## 2016-05-09 NOTE — Assessment & Plan Note (Signed)
Check labs  F/u hematology 

## 2016-05-11 ENCOUNTER — Encounter: Payer: Self-pay | Admitting: Family Medicine

## 2016-05-12 NOTE — Telephone Encounter (Signed)
Patient called to follow up on results. Request a call back once provider has reviewed.

## 2016-05-13 ENCOUNTER — Ambulatory Visit (HOSPITAL_BASED_OUTPATIENT_CLINIC_OR_DEPARTMENT_OTHER): Payer: Medicaid Other | Admitting: Hematology & Oncology

## 2016-05-13 ENCOUNTER — Other Ambulatory Visit (HOSPITAL_BASED_OUTPATIENT_CLINIC_OR_DEPARTMENT_OTHER): Payer: Medicaid Other

## 2016-05-13 VITALS — BP 150/78 | HR 75 | Temp 98.0°F | Wt 239.0 lb

## 2016-05-13 DIAGNOSIS — E038 Other specified hypothyroidism: Secondary | ICD-10-CM | POA: Diagnosis not present

## 2016-05-13 DIAGNOSIS — D471 Chronic myeloproliferative disease: Secondary | ICD-10-CM

## 2016-05-13 LAB — CBC WITH DIFFERENTIAL (CANCER CENTER ONLY)
BASO#: 0 10*3/uL (ref 0.0–0.2)
BASO%: 0.1 % (ref 0.0–2.0)
EOS%: 0.3 % (ref 0.0–7.0)
Eosinophils Absolute: 0.1 10*3/uL (ref 0.0–0.5)
HCT: 46.7 % — ABNORMAL HIGH (ref 34.8–46.6)
HGB: 15.7 g/dL (ref 11.6–15.9)
LYMPH#: 2.7 10*3/uL (ref 0.9–3.3)
LYMPH%: 12.6 % — AB (ref 14.0–48.0)
MCH: 31.6 pg (ref 26.0–34.0)
MCHC: 33.6 g/dL (ref 32.0–36.0)
MCV: 94 fL (ref 81–101)
MONO#: 1.1 10*3/uL — ABNORMAL HIGH (ref 0.1–0.9)
MONO%: 5 % (ref 0.0–13.0)
NEUT#: 17.7 10*3/uL — ABNORMAL HIGH (ref 1.5–6.5)
NEUT%: 82 % — ABNORMAL HIGH (ref 39.6–80.0)
Platelets: 335 10*3/uL (ref 145–400)
RBC: 4.97 10*6/uL (ref 3.70–5.32)
RDW: 12.9 % (ref 11.1–15.7)
WBC: 21.6 10*3/uL — AB (ref 3.9–10.0)

## 2016-05-13 LAB — CHCC SATELLITE - SMEAR

## 2016-05-13 LAB — LACTATE DEHYDROGENASE: LDH: 175 U/L (ref 125–245)

## 2016-05-13 NOTE — Telephone Encounter (Signed)
See results

## 2016-05-13 NOTE — Progress Notes (Signed)
Hematology and Oncology Follow Up Visit  Brianna Fletcher [id number] [birth year] 46 y.o. 05/13/2016   Principle Diagnosis:  Chronic myeloproliferative syndrome-Triple negative Sub-clinical hypothyroidism  Current Therapy:   Peg-Interferon q weekly - q 3 week dosing. Synthroid 0.050 mg po q day     Interim History:  [name] is back for followup. Thankfully, she is now seen Dr. Etter Sjogren.  I'm glad that she is able to see Dr.Lowne as she is a very thorough.  No surprise, she ended up in the emergency room. She seems and up they're quite often. She is having chest pain. [name] having some leg swelling. As always, a thorough workup was done and nothing was found. This is not untypical for her.  She then had some bruising on only the right side of her body. This is all resolved now except for maybe a little bit of ecchymoses on the top of her foot. [name] had no obvious bleeding.  There's been no cough. [name] had no fever. [name] had no change in bowel or bladder habits.  She did have a ultrasound of her abdomen done over the weekend. This was because she's having pain on her left side. Again, to no surprise, the ultrasound is relatively normal. She has a meningeal in her liver. There is no splenomegaly area did I suppose the pain could be some splenic pain despite no splenomegaly. There would really be no way to find this out.  She has now been off interferon probably for 4 months. I will like to try to keep her off interferon as long as possible.   Overall, her performance status is ECOG 1.  Medications:  Current Outpatient Prescriptions:  .  amLODipine (NORVASC) 10 MG tablet, Take 1 tablet (10 mg total) by mouth daily., Disp: 30 tablet, Rfl: 2 .  aspirin 81 MG tablet, Take 81 mg by mouth daily., Disp: , Rfl:  .  doxepin (SINEQUAN) 25 MG capsule, Take 25 mg by mouth., Disp: , Rfl:  .  hydrochlorothiazide (HYDRODIURIL) 25 MG tablet, Take 1 tablet (25 mg total) by mouth daily., Disp: 90  tablet, Rfl: 1 .  levothyroxine (SYNTHROID, LEVOTHROID) 50 MCG tablet, Take 1 tablet (50 mcg total) by mouth daily before breakfast., Disp: 30 tablet, Rfl: 3 .  omeprazole (PRILOSEC) 20 MG capsule, TAKE 1 CAPSULE (20 MG TOTAL) BY MOUTH DAILY., Disp: 30 capsule, Rfl: 11 .  PEGASYS 180 MCG/ML injection, Inject 0.45 mLs (81 mcg total) into the skin every 14 (fourteen) days., Disp: 2 mL, Rfl: 3 No current facility-administered medications for this visit.   Facility-Administered Medications Ordered in Other Visits:  .  acetaminophen (TYLENOL) tablet 650 mg, 650 mg, Oral, Once, Brianna Napoleon, MD .  peginterferon alfa-2a (PEGASYS) injection 81 mcg, 81 mcg, Subcutaneous, Weekly, Brianna Napoleon, MD, 81 mcg at 04/26/14 1530  Allergies:  Allergies  Allergen Reactions  . Amitiza [Lubiprostone]     Made her sick  . Lisinopril Cough  . Losartan Cough    Past Medical History, Surgical history, Social history, and Family History were reviewed and updated.  Review of Systems: As above  Physical Exam:  weight is 239 lb (108.4 kg). Her oral temperature is 98 F (36.7 C). Her blood pressure is 150/78 (abnormal) and her pulse is 75.   Well-developed and well-nourished white female. Head and neck exam shows no ocular or oral lesions. She has no palpable cervical or supraclavicular lymph nodes. Lungs are clear. Cardiac exam regular rate and rhythm with  no murmurs rubs or bruits. Abdomen is soft. She has good bowel sounds. She has healing Brosky be scars. There is no fluid wave. There is no palpable liver or spleen tip. Back exam shows no tenderness over the spine or ribs. There is some tenderness over the right hip area. This is chronic. She has some slight decreased range of motion of the right hip. Extremities shows no clubbing, cyanosis or edema. She has minimal edema in the left lower leg. No obvious venous cord is palpable. She has a negative Homans's sign in the left leg. She has good pulses in the  distal arteries. Right leg is relatively unremarkable. Skin exam no rashes, ecchymosis or petechia. Her skin is a little dry. Neurological exam shows no focal neurological deficits.  Lab Results  Component Value Date   WBC 21.6 (H) 05/13/2016   HGB 15.7 05/13/2016   HCT 46.7 (H) 05/13/2016   MCV 94 05/13/2016   PLT 335 05/13/2016     Chemistry      Component Value Date/Time   NA 137 04/23/2016 1004   NA 136 03/10/2016 1147   K 3.8 04/23/2016 1004   K 3.9 03/10/2016 1147   CL 102 04/23/2016 1004   CL 100 12/26/2015 1133   CO2 26 04/23/2016 1004   CO2 24 03/10/2016 1147   BUN 10 04/23/2016 1004   BUN 10.2 03/10/2016 1147   CREATININE 0.60 04/23/2016 1004   CREATININE 0.7 03/10/2016 1147      Component Value Date/Time   CALCIUM 10.9 (H) 04/23/2016 1004   CALCIUM 10.7 (H) 03/10/2016 1147   ALKPHOS 115 04/23/2016 1004   ALKPHOS 132 03/10/2016 1147   AST 9 04/23/2016 1004   AST 12 03/10/2016 1147   ALT 6 04/23/2016 1004   ALT 8 03/10/2016 1147   BILITOT 0.6 04/23/2016 1004   BILITOT 0.63 03/10/2016 1147         Impression and Plan: [name] is 46 year old female. She has a myeloproliferative syndrome. So far, our workup has been totally negative.  Her white cell count is up a little bit but it does not summer she has a lot of uritis. As such, I think we can hold off on restarting the PEG-interferon.  I told her that the transient bruising that she had good been related to high blood pressure. Could also be related to her being on aspirin even though it is low-dose. Also, given her age, there might be some hormonal shifts that could also increase her bruising. I told her to try some vitamin C and this may help.   We will get her back in 6 weeks. Hopefully, we will see her white cell count drop a little bit .  Brianna Napoleon, MD 2/22/20183:02 PM

## 2016-05-27 ENCOUNTER — Encounter: Payer: Self-pay | Admitting: Family Medicine

## 2016-05-27 ENCOUNTER — Ambulatory Visit (INDEPENDENT_AMBULATORY_CARE_PROVIDER_SITE_OTHER): Payer: Medicaid Other | Admitting: Family Medicine

## 2016-05-27 VITALS — BP 118/80 | HR 68 | Temp 97.5°F | Resp 16 | Ht 67.0 in | Wt 244.4 lb

## 2016-05-27 DIAGNOSIS — Z0000 Encounter for general adult medical examination without abnormal findings: Secondary | ICD-10-CM | POA: Diagnosis not present

## 2016-05-27 DIAGNOSIS — I10 Essential (primary) hypertension: Secondary | ICD-10-CM

## 2016-05-27 DIAGNOSIS — R079 Chest pain, unspecified: Secondary | ICD-10-CM | POA: Diagnosis not present

## 2016-05-27 DIAGNOSIS — M2550 Pain in unspecified joint: Secondary | ICD-10-CM | POA: Diagnosis not present

## 2016-05-27 DIAGNOSIS — E039 Hypothyroidism, unspecified: Secondary | ICD-10-CM | POA: Diagnosis not present

## 2016-05-27 LAB — POC URINALSYSI DIPSTICK (AUTOMATED)
Bilirubin, UA: NEGATIVE
Glucose, UA: NEGATIVE
Ketones, UA: NEGATIVE
Leukocytes, UA: NEGATIVE
Nitrite, UA: NEGATIVE
PH UA: 6
PROTEIN UA: NEGATIVE
RBC UA: NEGATIVE
SPEC GRAV UA: 1.015
UROBILINOGEN UA: NEGATIVE

## 2016-05-27 NOTE — Patient Instructions (Signed)

## 2016-05-27 NOTE — Progress Notes (Signed)
Patient ID: Brianna Fletcher, female   DOB: 15-Oct-1970, 46 y.o.   MRN: [id number] Subjective:  I acted as a Education administrator for Dr. Carollee Herter.  Guerry Bruin, CMA      Brianna Fletcher is a 46 y.o. female and is here for a comprehensive physical exam. The patient reports problems with bp cuff-- running higher at home.  Social History   Social History  . Marital status: Divorced    Spouse name: N/A  . Number of children: N/A  . Years of education: N/A   Occupational History  . unemployed    Social History Main Topics  . Smoking status: Former Smoker    Types: Cigarettes    Start date: 08/21/2014  . Smokeless tobacco: Never Used     Comment: patient reports light tobacco use, has not smoked since friday (09/27/14)  . Alcohol use 3.6 oz/week    6 Cans of beer per week  . Drug use: No  . Sexual activity: Not Currently    Partners: Male   Other Topics Concern  . Not on file   Social History Narrative   Very little exercise   Health Maintenance  Topic Date Due  . MAMMOGRAM  03/03/2016  . PAP SMEAR  06/30/2017  . TETANUS/TDAP  06/30/2024  . INFLUENZA VACCINE  Completed  . HIV Screening  Completed    The following portions of the patient's history were reviewed and updated as appropriate:  She  has a past medical history of Arthritis; Blood dyscrasia; Chronic neutrophilia (11/15/2012); Family history of heart disease; Glaucoma; Hypertension; Leukocytosis, unspecified (11/15/2012); Monocytosis (11/15/2012); Myeloproliferative disorder ([facility]) (03/08/2013); Precordial chest pain; Pruritus (11/15/2012); Radial artery occlusion, right ([facility]); and Vaginal delivery (1986, 2000). She  does not have any pertinent problems on file. She  has a past surgical history that includes Tubal ligation (2000); Eye surgery; Cardiac catheterization (N/A, 10/03/2014); Dilatation & currettage/hysteroscopy with novasure ablation (N/A, 11/08/2014); and Laparoscopic vaginal hysterectomy with salpingectomy (Bilateral,  03/05/2015). Her family history includes Cancer in her father; Dementia in her maternal grandmother; Diabetes in her father; Heart disease in her cousin, maternal grandmother, and mother; Heart disease (age of onset: 39) in her maternal grandfather; Hyperlipidemia in her mother; Hypertension in her mother. She  reports that she has quit smoking. Her smoking use included Cigarettes. She started smoking about 21 months ago. She has never used smokeless tobacco. She reports that she drinks about 3.6 oz of alcohol per week . She reports that she does not use drugs. She has a current medication list which includes the following prescription(s): amlodipine, aspirin, doxepin, hydrochlorothiazide, levothyroxine, omeprazole, and pegasys, and the following Facility-Administered Medications: acetaminophen and peginterferon alfa-2a. Current Outpatient Prescriptions on File Prior to Visit  Medication Sig Dispense Refill  . amLODipine (NORVASC) 10 MG tablet Take 1 tablet (10 mg total) by mouth daily. 30 tablet 2  . aspirin 81 MG tablet Take 81 mg by mouth daily.    Marland Kitchen doxepin (SINEQUAN) 25 MG capsule Take 25 mg by mouth.    . hydrochlorothiazide (HYDRODIURIL) 25 MG tablet Take 1 tablet (25 mg total) by mouth daily. 90 tablet 1  . levothyroxine (SYNTHROID, LEVOTHROID) 50 MCG tablet Take 1 tablet (50 mcg total) by mouth daily before breakfast. 30 tablet 3  . omeprazole (PRILOSEC) 20 MG capsule TAKE 1 CAPSULE (20 MG TOTAL) BY MOUTH DAILY. 30 capsule 11  . PEGASYS 180 MCG/ML injection Inject 0.45 mLs (81 mcg total) into the skin every 14 (fourteen) days. 2 mL 3  Current Facility-Administered Medications on File Prior to Visit  Medication Dose Route Frequency Provider Last Rate Last Dose  . acetaminophen (TYLENOL) tablet 650 mg  650 mg Oral Once [name], MD      . peginterferon alfa-2a (PEGASYS) injection 81 mcg  81 mcg Subcutaneous Weekly [name], MD   81 mcg at 04/26/14 1530   She is allergic to  amitiza [lubiprostone]; lisinopril; and losartan..  Review of Systems Review of Systems  Constitutional: Negative for activity change, appetite change and fatigue.  HENT: Negative for hearing loss, congestion, tinnitus and ear discharge.  dentist q38m Eyes: Negative for visual disturbance (see optho q1y -- vision corrected to 20/20 with glasses).  Respiratory: Negative for cough, chest tightness and shortness of breath.   Cardiovascular: Negative for chest pain, palpitations and leg swelling.  Gastrointestinal: Negative for abdominal pain, diarrhea, constipation and abdominal distention.  Genitourinary: Negative for urgency, frequency, decreased urine volume and difficulty urinating.  Musculoskeletal: Negative for back pain, arthralgias and gait problem.  Skin: Negative for color change, pallor and rash.  Neurological: Negative for dizziness, light-headedness, numbness and headaches.  Hematological: Negative for adenopathy. Does not bruise/bleed easily.  Psychiatric/Behavioral: Negative for suicidal ideas, confusion, sleep disturbance, self-injury, dysphoric mood, decreased concentration and agitation.       Objective:    BP 118/80 (BP Location: Left Arm, Cuff Size: Large)   Pulse 68   Temp 97.5 F (36.4 C) (Oral)   Resp 16   Ht 5\' 7"  (1.702 m)   Wt 244 lb 6.4 oz (110.9 kg)   SpO2 98%   BMI 38.28 kg/m  General appearance: alert, cooperative, appears stated age and no distress Head: Normocephalic, without obvious abnormality, atraumatic Eyes: conjunctivae/corneas clear. PERRL, EOM's intact. Fundi benign. Ears: normal TM's and external ear canals both ears Nose: Nares normal. Septum midline. Mucosa normal. No drainage or sinus tenderness. Throat: lips, mucosa, and tongue normal; teeth and gums normal Neck: no adenopathy, no carotid bruit, no JVD, supple, symmetrical, trachea midline and thyroid not enlarged, symmetric, no tenderness/mass/nodules Back: symmetric, no curvature. ROM  normal. No CVA tenderness. Lungs: clear to auscultation bilaterally Breasts: normal appearance, no masses or tenderness Heart: regular rate and rhythm, S1, S2 normal, no murmur, click, rub or gallop Abdomen: soft, non-tender; bowel sounds normal; no masses,  no organomegaly Pelvic: not indicated; status post hysterectomy, negative ROS Extremities: extremities normal, atraumatic, no cyanosis or edema Pulses: 2+ and symmetric Skin: Skin color, texture, turgor normal. No rashes or lesions Lymph nodes: Cervical, supraclavicular, and axillary nodes normal. Neurologic: Alert and oriented X 3, normal strength and tone. Normal symmetric reflexes. Normal coordination and gait    Assessment:    Healthy female exam.      Plan:     See After Visit Summary for Counseling Recommendations    1. Preventative health care See above - POCT Urinalysis Dipstick (Automated) - Comprehensive metabolic panel - Lipid panel - TSH - Vitamin D 1,25 dihydroxy - Vitamin B12  2. Essential hypertension Stable  - POCT Urinalysis Dipstick (Automated) - Comprehensive metabolic panel  3. Hypothyroidism, unspecified type   - TSH  4. Arthralgia of multiple sites    5. Chest pain, unspecified type  - EKG 12-Lead

## 2016-05-27 NOTE — Progress Notes (Signed)
Pre visit review using our clinic review tool, if applicable. No additional management support is needed unless otherwise documented below in the visit note. 

## 2016-05-28 LAB — COMPREHENSIVE METABOLIC PANEL
ALT: 9 U/L (ref 0–35)
AST: 14 U/L (ref 0–37)
Albumin: 4.4 g/dL (ref 3.5–5.2)
Alkaline Phosphatase: 126 U/L — ABNORMAL HIGH (ref 39–117)
BUN: 15 mg/dL (ref 6–23)
CO2: 24 meq/L (ref 19–32)
Calcium: 11.2 mg/dL — ABNORMAL HIGH (ref 8.4–10.5)
Chloride: 100 mEq/L (ref 96–112)
Creatinine, Ser: 0.73 mg/dL (ref 0.40–1.20)
GFR: 90.37 mL/min (ref >60.00)
GLUCOSE: 76 mg/dL (ref 70–99)
POTASSIUM: 3.9 meq/L (ref 3.5–5.1)
SODIUM: 136 meq/L (ref 135–145)
Total Bilirubin: 0.4 mg/dL (ref 0.2–1.2)
Total Protein: 7.9 g/dL (ref 6.0–8.3)

## 2016-05-28 LAB — TSH: TSH: 0.5 u[IU]/mL (ref 0.35–4.50)

## 2016-05-28 LAB — LIPID PANEL
CHOL/HDL RATIO: 4
Cholesterol: 218 mg/dL — ABNORMAL HIGH (ref 0–200)
HDL: 53.2 mg/dL (ref >39.00)
LDL CALC: 129 mg/dL — AB (ref 0–99)
NONHDL: 165.21
Triglycerides: 179 mg/dL — ABNORMAL HIGH (ref 0.0–149.0)
VLDL: 35.8 mg/dL (ref 0.0–40.0)

## 2016-05-28 LAB — VITAMIN B12: Vitamin B-12: 421 pg/mL (ref 211–911)

## 2016-06-01 ENCOUNTER — Encounter: Payer: Medicaid Other | Attending: Family Medicine | Admitting: Registered"

## 2016-06-01 DIAGNOSIS — E669 Obesity, unspecified: Secondary | ICD-10-CM | POA: Diagnosis not present

## 2016-06-01 DIAGNOSIS — Z713 Dietary counseling and surveillance: Secondary | ICD-10-CM | POA: Insufficient documentation

## 2016-06-01 DIAGNOSIS — Z6839 Body mass index (BMI) 39.0-39.9, adult: Secondary | ICD-10-CM | POA: Diagnosis not present

## 2016-06-01 NOTE — Patient Instructions (Addendum)
Plan: Increase antioxidants and fiber [employer] foods high in fiber (especially vegetables) to increase them in your diet Can have some egg yolks - 4-5 per week should be fine Choose whole grains Include more berries, walnuts, almonds, etc. chia seeds, flaxseeds Try to get some physical activity daily (consider arm chair activities) Limit sweet tea

## 2016-06-01 NOTE — Progress Notes (Signed)
Medical Nutrition Therapy:  Appt start time: 0930 end time:  1030.  Assessment:  Primary concerns today: Patient would like to lose weight. Patient states she feels she is eating right but keeps gaining weight with a recent 5 lb gain in 1 month. Patient states she was extremely small ~90 lbs until had children. Started to gain weight after last child was born 1 yrs ago.  She states she has been on thyroid med for 2 months and dropped 5 lbs first month.   Patient concern with weight seemed to start around 3 years ago. With further investigation several things have changed within the last few years. Patient states she had experienced frequent loose stools prior to 3 yrs ago, now has frequent constipation. Patient states her doctor has tried 2 meds but she discontinued because of negative side effects. Patient states 3.5 yrs ago diagnosed with blood disorder myeloprolliferative disorder (pre-lupus).   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  In effort to lose weight, patient states in December she tried the boiled egg diet, 2 boiled eggs and fruit, then in January because of BP issues - quick using salt. And in Feb. due to cholesterol, started eating egg whites & fruit She reports she is also eating more avocados.  Preferred Learning Style:  No preference indicated   Learning Readiness:   Contemplating  MEDICATIONS: reviewed   DIETARY INTAKE:  Usual eating pattern includes 2 meals and 1-2 snacks per day.  Everyday foods include eggs.  Avoided foods include tuna.    24-hr recall:  B ( AM): egg whites & fruit OR sometimes add avocado to eggs Snk ( AM): Mc[employer]lads oatmeal  L ( PM): none Snk ( PM): humus and pretzel chips (just started last week) D ( PM): eating out, Stuffed avocado, salad OR 1x week pizza, salad OR burger 1-2 x month Snk ( PM): (tries not to eat but boyfriend influences) Beverages: spells  where water only, some water now, sweet tea 32 oz, coffee 1 pk sugar, mainly in morning, one 7 oz coke cans sometimes  Usual physical activity: ADL (limited due to right hip needs replacement.)  Estimated energy needs: 1600 calories 180 g carbohydrates 120 g protein 44 g fat  Progress Towards Goal(s):  In progress.   Nutritional Diagnosis:  [place]-3.3 Overweight/obesity As related to frequent eating out/excess calories.  As evidenced by diet recall and BMI of 39.    Intervention:  Nutrition Edcuation. Reviewed general healthy eating guidelines. Discussed foods with antioxidants and fiber. Discussed importance of exercise for overall health and ways to be creative to work around hip issue.  Plan: Increase antioxidants and fiber [employer] foods high in fiber (especially vegetables) to increase them in your diet Can have some egg yolks - 4-5 per week should be fine Choose whole grains Include more berries, walnuts, almonds, etc. chia seeds, flaxseeds Try to get some physical activity daily (consider arm chair activities) Limit sweet tea  Teaching Method Utilized:  Visual Auditory  Handouts given during visit include:  Arm chair exercises   Types of Fat  Barriers to learning/adherence to lifestyle change: none  Demonstrated degree of understanding via:  Teach Back   Monitoring/Evaluation:  Dietary intake, exercise, and body weight prn.

## 2016-06-03 LAB — VITAMIN D 1,25 DIHYDROXY
VITAMIN D3 1, 25 (OH): 55 pg/mL
Vitamin D 1, 25 (OH)2 Total: 68 pg/mL (ref 18–72)
Vitamin D2 1, 25 (OH)2: 13 pg/mL

## 2016-06-04 ENCOUNTER — Ambulatory Visit (INDEPENDENT_AMBULATORY_CARE_PROVIDER_SITE_OTHER): Payer: Medicaid Other

## 2016-06-04 ENCOUNTER — Other Ambulatory Visit (INDEPENDENT_AMBULATORY_CARE_PROVIDER_SITE_OTHER): Payer: Self-pay

## 2016-06-04 ENCOUNTER — Encounter (INDEPENDENT_AMBULATORY_CARE_PROVIDER_SITE_OTHER): Payer: Self-pay | Admitting: Orthopaedic Surgery

## 2016-06-04 ENCOUNTER — Ambulatory Visit (INDEPENDENT_AMBULATORY_CARE_PROVIDER_SITE_OTHER): Payer: Medicaid Other | Admitting: Orthopaedic Surgery

## 2016-06-04 ENCOUNTER — Telehealth (INDEPENDENT_AMBULATORY_CARE_PROVIDER_SITE_OTHER): Payer: Self-pay | Admitting: Orthopaedic Surgery

## 2016-06-04 VITALS — BP 130/82 | HR 57 | Resp 14 | Ht 66.0 in | Wt 244.0 lb

## 2016-06-04 DIAGNOSIS — M5442 Lumbago with sciatica, left side: Secondary | ICD-10-CM

## 2016-06-04 DIAGNOSIS — M25551 Pain in right hip: Secondary | ICD-10-CM

## 2016-06-04 NOTE — Telephone Encounter (Signed)
Patient called stating that the place we referred her to for physical therapy does not accept her medicaid.  [facility] to [facility] located [address] [place], [place].  Their phone is [phone] and the fax [phone].  They need [place] to refer her to the new location for her physical therapy.  Her [phone].  Thank you.

## 2016-06-04 NOTE — Progress Notes (Signed)
Office Visit Note   Patient: Brianna Fletcher           Date of Birth: [birth year]           MRN: [id number] Visit Date: 06/04/2016              Requested by: Ann Held, DO [address] [place], [place] [address] PCP: Ann Held, DO   Assessment & Plan: Visit Diagnoses:De Generative disc disease L5-S1. Hip joints appear to be clear of obvious arthritis  Plan: Continue with nutritional evaluation and weight loss, physical therapy in [place], follow-up approximately 2 months. Office visit nearly 40 minutes regarding counseling and diagnosis.  Follow-Up Instructions: No Follow-up on file.   Orders:  No orders of the defined types were placed in this encounter.  No orders of the defined types were placed in this encounter.     Procedures: No procedures performed   Clinical Data: No additional findings.   Subjective: No chief complaint on file.   Brianna Fletcher is a 45 year old female here today for chronic right hip pain. Dr. Marin Olp saw the patient in 2015 and had a CT done of her Right hip and a biopsy and told patient she had the hip of an "48  Year old".  She has a rare blood disease, myeloprolifereactive disorder, possibly leading to leukemia. Her PCP, Dr. Carollee Herter advised her to visit an orthopedic for her hip problem.  Brianna Fletcher relates that she has some nondescript pain in the area of her right hemipelvis with bending stooping or standing for a length of time. It's been very difficult for her to  localize her discomfort . Occasionally with standing for long periods of time she'll experience some numbness in both of her feet. She has not worked in the last 17 months and has applied for disability.  Review of Systems   Objective: Vital Signs: There were no vitals taken for this visit.  Physical Exam  Ortho Exam BMI 39. Straight leg raise negative bilaterally. Painless range of motion of both hips with internal and external  rotation. Leg lengths symmetrical. No swelling distally. Neurovascular exam intact. Some percussible tenderness of the lumbar spine. No pain over either greater trochanter or buttock.  Specialty Comments:  No specialty comments available.  Imaging: No results found.   PMFS History: Patient Active Problem List   Diagnosis Date Noted  . Bruising 05/09/2016  . Change in stool habits 05/11/2015  . Rectal bleeding 05/11/2015  . S/P hysterectomy 03/05/2015  . LUQ pain 01/14/2015  . Constipation 01/14/2015  . Severe obesity (BMI >= 40) ([facility]) 12/23/2014  . Morbid obesity ([facility]) 12/23/2014  . Radial artery occlusion, right ([facility]) 12/20/2014  . HTN (hypertension) 11/04/2014  . Right arm pain 11/04/2014  . HTN, goal below 130/80 11/04/2014  . Cough 11/04/2014  . Chest pain 10/01/2014  . Vitamin D deficiency 08/26/2014  . Cramp in lower leg 08/26/2014  . Right thyroid nodule 07/22/2014  . Hyperparathyroidism ([facility]) 07/22/2014  . Myeloproliferative disorder ([facility]) 03/08/2013  . Leukocytosis 11/15/2012  . Chronic neutrophilia 11/15/2012  . Pruritus 11/15/2012  . Monocytosis 11/15/2012   Past Medical History:  Diagnosis Date  . Arthritis   . Blood dyscrasia    produces too many WBCs  . Chronic neutrophilia 11/15/2012  . Family history of heart disease    mother had stents placed in her 48s and had myocardial infarctions  . Glaucoma   . Hypertension   .  Leukocytosis, unspecified 11/15/2012  . Monocytosis 11/15/2012  . Myeloproliferative disorder ([facility]) 03/08/2013  . Precordial chest pain   . Pruritus 11/15/2012  . Radial artery occlusion, right ([facility])    status post  right radial artery cardiac catheterization  . Vaginal delivery 1986, 2000    Family History  Problem Relation Age of Onset  . Heart disease Mother   . Hypertension Mother   . Hyperlipidemia Mother   . Diabetes Father   . Cancer Father     esopageal ?  . Dementia Maternal Grandmother   . Heart disease Maternal  Grandmother   . Heart disease Maternal Grandfather 33    MI  . Heart disease Cousin   . Other Neg Hx     hyperparathyroidism    Past Surgical History:  Procedure Laterality Date  . CARDIAC CATHETERIZATION N/A 10/03/2014   Procedure: Left Heart Cath and Coronary Angiography;  Surgeon: Lorretta Harp, MD;  Location: [facility];  Service: Cardiovascular;  Laterality: N/A;  . DILITATION & CURRETTAGE/HYSTROSCOPY WITH NOVASURE ABLATION N/A 11/08/2014   Procedure: DILATATION & CURETTAGE/HYSTEROSCOPY WITH NOVASURE ABLATION;  Surgeon: Linda Hedges, DO;  Location: [facility];  Service: Gynecology;  Laterality: N/A;  . EYE SURGERY     laser eye surgery  . LAPAROSCOPIC VAGINAL HYSTERECTOMY WITH SALPINGECTOMY Bilateral 03/05/2015   Procedure: LAPAROSCOPIC ASSISTED VAGINAL HYSTERECTOMY WITH SALPINGECTOMY;  Surgeon: Linda Hedges, DO;  Location: [facility];  Service: Gynecology;  Laterality: Bilateral;  . TUBAL LIGATION  2000   Social History   Occupational History  . unemployed    Social History Main Topics  . Smoking status: Former Smoker    Types: Cigarettes    Start date: 08/21/2014  . Smokeless tobacco: Never Used     Comment: patient reports light tobacco use, has not smoked since friday (09/27/14)  . Alcohol use 3.6 oz/week    6 Cans of beer per week  . Drug use: No  . Sexual activity: Not Currently    Partners: Male

## 2016-06-07 NOTE — Telephone Encounter (Signed)
Please advise 

## 2016-06-08 ENCOUNTER — Telehealth: Payer: Self-pay | Admitting: Family Medicine

## 2016-06-08 NOTE — Telephone Encounter (Signed)
Recd VM from pt to call her about referral. LM for her to call me back if needed.

## 2016-06-24 ENCOUNTER — Ambulatory Visit (HOSPITAL_BASED_OUTPATIENT_CLINIC_OR_DEPARTMENT_OTHER): Payer: Medicaid Other | Admitting: Hematology & Oncology

## 2016-06-24 ENCOUNTER — Other Ambulatory Visit (HOSPITAL_BASED_OUTPATIENT_CLINIC_OR_DEPARTMENT_OTHER): Payer: Medicaid Other

## 2016-06-24 ENCOUNTER — Ambulatory Visit: Payer: Medicaid Other | Admitting: Registered"

## 2016-06-24 VITALS — BP 143/73 | HR 73 | Temp 97.8°F | Resp 16 | Wt 243.4 lb

## 2016-06-24 DIAGNOSIS — E041 Nontoxic single thyroid nodule: Secondary | ICD-10-CM

## 2016-06-24 DIAGNOSIS — E038 Other specified hypothyroidism: Secondary | ICD-10-CM | POA: Diagnosis not present

## 2016-06-24 DIAGNOSIS — D471 Chronic myeloproliferative disease: Secondary | ICD-10-CM

## 2016-06-24 LAB — CBC WITH DIFFERENTIAL (CANCER CENTER ONLY)
BASO#: 0 10*3/uL (ref 0.0–0.2)
BASO%: 0.1 % (ref 0.0–2.0)
EOS%: 0.5 % (ref 0.0–7.0)
Eosinophils Absolute: 0.1 10*3/uL (ref 0.0–0.5)
HEMATOCRIT: 42.8 % (ref 34.8–46.6)
HGB: 14.4 g/dL (ref 11.6–15.9)
LYMPH#: 3 10*3/uL (ref 0.9–3.3)
LYMPH%: 12.7 % — ABNORMAL LOW (ref 14.0–48.0)
MCH: 31.7 pg (ref 26.0–34.0)
MCHC: 33.6 g/dL (ref 32.0–36.0)
MCV: 94 fL (ref 81–101)
MONO#: 1.2 10*3/uL — ABNORMAL HIGH (ref 0.1–0.9)
MONO%: 4.9 % (ref 0.0–13.0)
NEUT#: 19.2 10*3/uL — ABNORMAL HIGH (ref 1.5–6.5)
NEUT%: 81.8 % — AB (ref 39.6–80.0)
PLATELETS: 329 10*3/uL (ref 145–400)
RBC: 4.54 10*6/uL (ref 3.70–5.32)
RDW: 13.1 % (ref 11.1–15.7)
WBC: 23.5 10*3/uL — ABNORMAL HIGH (ref 3.9–10.0)

## 2016-06-24 LAB — CHCC SATELLITE - SMEAR

## 2016-06-24 NOTE — Progress Notes (Signed)
Hematology and Oncology Follow Up Visit  AHLIVIA SALAHUDDIN [id number] [birth year] 46 y.o. 06/24/2016   Principle Diagnosis:  Chronic myeloproliferative syndrome-Triple negative Sub-clinical hypothyroidism  Current Therapy:   Peg-Interferon q weekly - q 2 week dosing - restart on f04/02/2017. Synthroid 0.050 mg po q day     Interim History:  Ms.  Bazar is back for followup. Unfortunately, she is having more issues. She actually saw orthopedic surgery. Apparently she is having lower back problems. This is causing pain in the right hip. She was told that there is nothing that can be done surgically. She was referred to physical therapy but could not have physical therapy because of her being on Medicaid.  She is having some intermittent swelling in the lower legs and feet. [name] had some intermittent redness of the lower legs and feet. Is having she may have reflex sympathetic dystrophy.  She also had an episode of chest pain. She will cup with this. [name] had this before. [name] been to the emergency room before. She did not think that she had to go to the emergency room. The episode passed.   She has had no obvious rash. [name] had no obvious fever. There's been no cough. [name] had no change in bowel or bladder habits.   She has been off interferon now for probably 5 months. She is doing well, to date off interferon.   Overall, her performance status is ECOG 1.  Medications:  Current Outpatient Prescriptions:  .  amLODipine (NORVASC) 10 MG tablet, Take 1 tablet (10 mg total) by mouth daily., Disp: 30 tablet, Rfl: 2 .  aspirin 81 MG tablet, Take 81 mg by mouth daily., Disp: , Rfl:  .  doxepin (SINEQUAN) 25 MG capsule, Take 25 mg by mouth., Disp: , Rfl:  .  hydrochlorothiazide (HYDRODIURIL) 25 MG tablet, Take 1 tablet (25 mg total) by mouth daily., Disp: 90 tablet, Rfl: 1 .  levothyroxine (SYNTHROID, LEVOTHROID) 50 MCG tablet, Take 1 tablet (50 mcg total) by mouth daily before breakfast.,  Disp: 30 tablet, Rfl: 3 .  omeprazole (PRILOSEC) 20 MG capsule, TAKE 1 CAPSULE (20 MG TOTAL) BY MOUTH DAILY., Disp: 30 capsule, Rfl: 11 .  PEGASYS 180 MCG/ML injection, Inject 0.45 mLs (81 mcg total) into the skin every 14 (fourteen) days., Disp: 2 mL, Rfl: 3 No current facility-administered medications for this visit.   Facility-Administered Medications Ordered in Other Visits:  .  acetaminophen (TYLENOL) tablet 650 mg, 650 mg, Oral, Once, [name], MD .  peginterferon alfa-2a (PEGASYS) injection 81 mcg, 81 mcg, Subcutaneous, Weekly, [name], MD, 81 mcg at 04/26/14 1530  Allergies:  Allergies  Allergen Reactions  . Amitiza [Lubiprostone]     Made her sick  . Lisinopril Cough  . Losartan Cough    Past Medical History, Surgical history, Social history, and Family History were reviewed and updated.  Review of Systems: As above  Physical Exam:  weight is 243 lb 6.4 oz (110.4 kg). Her oral temperature is 97.8 F (36.6 C). Her blood pressure is 143/73 (abnormal) and her pulse is 73. Her respiration is 16 and oxygen saturation is 98%.   Well-developed and well-nourished white female. Head and neck exam shows no ocular or oral lesions. She has no palpable cervical or supraclavicular lymph nodes. Lungs are clear. Cardiac exam regular rate and rhythm with no murmurs rubs or bruits. Abdomen is soft. She has good bowel sounds. She has healing Brosky be scars. There is no fluid wave. There  is no palpable liver or spleen tip. Back exam shows no tenderness over the spine or ribs. There is some tenderness over the right hip area. This is chronic. She has some slight decreased range of motion of the right hip. Extremities shows no clubbing, cyanosis or edema. She has minimal edema in the left lower leg. No obvious venous cord is palpable. She has a negative Homans's sign in the left leg. She has good pulses in the distal arteries. Right leg is relatively unremarkable. Skin exam no rashes,  ecchymosis or petechia. Her skin is a little dry. Neurological exam shows no focal neurological deficits.  Lab Results  Component Value Date   WBC 23.5 (H) 06/24/2016   HGB 14.4 06/24/2016   HCT 42.8 06/24/2016   MCV 94 06/24/2016   PLT 329 06/24/2016     Chemistry      Component Value Date/Time   NA 136 05/27/2016 1630   NA 136 03/10/2016 1147   K 3.9 05/27/2016 1630   K 3.9 03/10/2016 1147   CL 100 05/27/2016 1630   CL 100 12/26/2015 1133   CO2 24 05/27/2016 1630   CO2 24 03/10/2016 1147   BUN 15 05/27/2016 1630   BUN 10.2 03/10/2016 1147   CREATININE 0.73 05/27/2016 1630   CREATININE 0.7 03/10/2016 1147      Component Value Date/Time   CALCIUM 11.2 (H) 05/27/2016 1630   CALCIUM 10.7 (H) 03/10/2016 1147   ALKPHOS 126 (H) 05/27/2016 1630   ALKPHOS 132 03/10/2016 1147   AST 14 05/27/2016 1630   AST 12 03/10/2016 1147   ALT 9 05/27/2016 1630   ALT 8 03/10/2016 1147   BILITOT 0.4 05/27/2016 1630   BILITOT 0.63 03/10/2016 1147         Impression and Plan: Ms. Federer is 46 year old female. She has a myeloproliferative syndrome. So far, our workup has been totally negative.  I believe that her blood counts clearly show that this myeloproliferative syndrome is becoming more active. Her white cell count is gradually increasing.  I think we have to get her back onto PEG-In. This worked very well for her. She actually has some in her refrigerator. She has 4 syringes that she has not used.  I told her to take these if they're not out of date. If they are out of date, she will let us know and we will get her back on her last dose was 81 mcg. I told her that she should do this once every 2 weeks. This should be adequate to get her back into a partial remission.  I will like to see her back in 6 weeks. We can certainly make adjustments with her dose if necessary.  I spent about 40 minutes with her today. She always has a lot of issues that are going on when I see her   .  [name], MD 4/5/20182:49 PM

## 2016-06-25 LAB — LACTATE DEHYDROGENASE: LDH: 191 U/L (ref 125–245)

## 2016-06-29 ENCOUNTER — Other Ambulatory Visit: Payer: Self-pay | Admitting: Hematology & Oncology

## 2016-06-29 DIAGNOSIS — E041 Nontoxic single thyroid nodule: Secondary | ICD-10-CM

## 2016-06-29 DIAGNOSIS — D471 Chronic myeloproliferative disease: Secondary | ICD-10-CM

## 2016-07-27 ENCOUNTER — Ambulatory Visit (INDEPENDENT_AMBULATORY_CARE_PROVIDER_SITE_OTHER): Payer: Medicaid Other | Admitting: Family Medicine

## 2016-07-27 ENCOUNTER — Encounter: Payer: Self-pay | Admitting: Family Medicine

## 2016-07-27 VITALS — BP 126/88 | HR 75 | Temp 98.1°F | Resp 16 | Ht 66.0 in | Wt 240.4 lb

## 2016-07-27 DIAGNOSIS — R600 Localized edema: Secondary | ICD-10-CM | POA: Diagnosis not present

## 2016-07-27 DIAGNOSIS — R21 Rash and other nonspecific skin eruption: Secondary | ICD-10-CM

## 2016-07-27 MED ORDER — PREDNISONE 10 MG PO TABS: ORAL_TABLET | ORAL | 0 refills | Status: DC

## 2016-07-27 MED ORDER — FUROSEMIDE 20 MG PO TABS: 20.0000 mg | ORAL_TABLET | Freq: Every day | ORAL | 3 refills | Status: DC

## 2016-07-27 NOTE — Progress Notes (Signed)
Pre visit review using our clinic review tool, if applicable. No additional management support is needed unless otherwise documented below in the visit note. 

## 2016-07-27 NOTE — Progress Notes (Signed)
Patient ID: Brianna Fletcher, female   DOB: [birth year], 46 y.o.   MRN: [id number]    Subjective:  I acted as a Education administrator for Dr. Carollee Herter.  Guerry Bruin, [place]   Patient ID: Brianna Fletcher, female    DOB: 02-11-1971, 46 y.o.   MRN: [id number]  Chief Complaint  Patient presents with  . Rash    5 days.    HPI Patient is in today for leg rash.  She had 5 days of rash, warm, swelling of both feet.  The rash was itchy and red.  Feet were swollen.  She went for a pedicure yesterday and got a pedicure.  They used a lemon extract which helped with swelling and redness.  She now has red bumps that did not go away.  She also has a bruise on the back of her leg.  She does not remember hitting it.  Past Medical History:  Diagnosis Date  . Arthritis   . Blood dyscrasia    produces too many WBCs  . Chronic neutrophilia 11/15/2012  . Family history of heart disease    mother had stents placed in her 85s and had myocardial infarctions  . Glaucoma   . Hypertension   . Leukocytosis, unspecified 11/15/2012  . Monocytosis 11/15/2012  . Myeloproliferative disorder ([place]) 03/08/2013  . Precordial chest pain   . Pruritus 11/15/2012  . Radial artery occlusion, right (HCC)    status post  right radial artery cardiac catheterization  . Vaginal delivery 1986, 2000    Past Surgical History:  Procedure Laterality Date  . CARDIAC CATHETERIZATION N/A 10/03/2014   Procedure: Left Heart Cath and Coronary Angiography;  Surgeon: Lorretta Harp, MD;  Location: [facility];  Service: Cardiovascular;  Laterality: N/A;  . DILITATION & CURRETTAGE/HYSTROSCOPY WITH NOVASURE ABLATION N/A 11/08/2014   Procedure: DILATATION & CURETTAGE/HYSTEROSCOPY WITH NOVASURE ABLATION;  Surgeon: Linda Hedges, DO;  Location: [facility];  Service: Gynecology;  Laterality: N/A;  . EYE SURGERY     laser eye surgery  . LAPAROSCOPIC VAGINAL HYSTERECTOMY WITH SALPINGECTOMY Bilateral 03/05/2015   Procedure: LAPAROSCOPIC ASSISTED VAGINAL HYSTERECTOMY  WITH SALPINGECTOMY;  Surgeon: Linda Hedges, DO;  Location: [facility];  Service: Gynecology;  Laterality: Bilateral;  . TUBAL LIGATION  2000    Family History  Problem Relation Age of Onset  . Heart disease Mother   . Hypertension Mother   . Hyperlipidemia Mother   . Diabetes Father   . Cancer Father     esopageal ?  . Dementia Maternal Grandmother   . Heart disease Maternal Grandmother   . Heart disease Maternal Grandfather 71    MI  . Heart disease Cousin   . Other Neg Hx     hyperparathyroidism    Social History   Social History  . Marital status: Divorced    Spouse name: N/A  . Number of children: N/A  . Years of education: N/A   Occupational History  . unemployed    Social History Main Topics  . Smoking status: Former Smoker    Types: Cigarettes    Start date: 08/21/2014  . Smokeless tobacco: Never Used     Comment: patient reports light tobacco use, has not smoked since friday (09/27/14)  . Alcohol use 3.6 oz/week    6 Cans of beer per week  . Drug use: No  . Sexual activity: Not Currently    Partners: Male   Other Topics Concern  . Not on file   Social History Narrative  Very little exercise    Outpatient Medications Prior to Visit  Medication Sig Dispense Refill  . amLODipine (NORVASC) 10 MG tablet Take 1 tablet (10 mg total) by mouth daily. 30 tablet 2  . aspirin 81 MG tablet Take 81 mg by mouth daily.    Marland Kitchen doxepin (SINEQUAN) 25 MG capsule Take 25 mg by mouth.    . levothyroxine (SYNTHROID, LEVOTHROID) 50 MCG tablet Take 1 tablet (50 mcg total) by mouth daily before breakfast. 30 tablet 3  . omeprazole (PRILOSEC) 20 MG capsule TAKE 1 CAPSULE (20 MG TOTAL) BY MOUTH DAILY. 30 capsule 11  . PEGASYS 180 MCG/ML injection Inject 0.45 mLs (81 mcg total) into the skin every 14 (fourteen) days. 2 mL 3  . hydrochlorothiazide (HYDRODIURIL) 25 MG tablet Take 1 tablet (25 mg total) by mouth daily. 90 tablet 1  . levothyroxine (SYNTHROID, LEVOTHROID) 50 MCG tablet TAKE  ONE TABLET BY MOUTH DAILY BEFORE BREAKFAST 30 tablet 6   Facility-Administered Medications Prior to Visit  Medication Dose Route Frequency Provider Last Rate Last Dose  . acetaminophen (TYLENOL) tablet 650 mg  650 mg Oral Once [name], MD      . peginterferon alfa-2a (PEGASYS) injection 81 mcg  81 mcg Subcutaneous Weekly [name], MD   81 mcg at 04/26/14 1530    Allergies  Allergen Reactions  . Amitiza [Lubiprostone]     Made her sick  . Lisinopril Cough  . Losartan Cough    Review of Systems  Constitutional: Negative for fever and malaise/fatigue.  HENT: Negative for congestion.   Eyes: Negative for blurred vision.  Respiratory: Negative for cough and shortness of breath.   Cardiovascular: Positive for leg swelling. Negative for chest pain and palpitations.  Gastrointestinal: Negative for vomiting.  Musculoskeletal: Negative for back pain.  Skin: Positive for itching and rash.       Both lower legs  Neurological: Negative for loss of consciousness and headaches.  Endo/Heme/Allergies: Bruises/bleeds easily.       Back of right leg       Objective:    Physical Exam  Constitutional: She is oriented to person, place, and time. She appears well-developed and well-nourished. No distress.  HENT:  Head: Normocephalic and atraumatic.  Eyes: Conjunctivae are normal.  Neck: Normal range of motion. No thyromegaly present.  Cardiovascular: Normal rate and regular rhythm.   Pulmonary/Chest: Effort normal and breath sounds normal. She has no wheezes.  Abdominal: Soft. Bowel sounds are normal. There is no tenderness.  Musculoskeletal: Normal range of motion. She exhibits edema. She exhibits no deformity.       Right lower leg: She exhibits swelling and edema.       Left lower leg: She exhibits swelling and edema.       Legs: Neurological: She is alert and oriented to person, place, and time.  Skin: Skin is warm and dry. Rash noted. She is not diaphoretic. There is  erythema.  Psychiatric: She has a normal mood and affect.  Nursing note and vitals reviewed.   BP 126/88 (BP Location: Right Arm, Cuff Size: Large)   Pulse 75   Temp 98.1 F (36.7 C) (Oral)   Resp 16   Ht '5\' 6"'  (1.676 m)   Wt 240 lb 6.4 oz (109 kg)   SpO2 97%   BMI 38.80 kg/m  Wt Readings from Last 3 Encounters:  07/27/16 240 lb 6.4 oz (109 kg)  06/24/16 243 lb 6.4 oz (110.4 kg)  06/04/16 244 lb (  110.7 kg)     Lab Results  Component Value Date   WBC 23.5 (H) 06/24/2016   HGB 14.4 06/24/2016   HCT 42.8 06/24/2016   PLT 329 06/24/2016   GLUCOSE 76 05/27/2016   CHOL 218 (H) 05/27/2016   TRIG 179.0 (H) 05/27/2016   HDL 53.20 05/27/2016   LDLCALC 129 (H) 05/27/2016   ALT 9 05/27/2016   AST 14 05/27/2016   NA 136 05/27/2016   K 3.9 05/27/2016   CL 100 05/27/2016   CREATININE 0.73 05/27/2016   BUN 15 05/27/2016   CO2 24 05/27/2016   TSH 0.50 05/27/2016   INR 0.9 05/06/2016   MICROALBUR <0.7 04/23/2016    Lab Results  Component Value Date   TSH 0.50 05/27/2016   Lab Results  Component Value Date   WBC 23.5 (H) 06/24/2016   HGB 14.4 06/24/2016   HCT 42.8 06/24/2016   MCV 94 06/24/2016   PLT 329 06/24/2016   Lab Results  Component Value Date   NA 136 05/27/2016   K 3.9 05/27/2016   CHLORIDE 102 03/10/2016   CO2 24 05/27/2016   GLUCOSE 76 05/27/2016   BUN 15 05/27/2016   CREATININE 0.73 05/27/2016   BILITOT 0.4 05/27/2016   ALKPHOS 126 (H) 05/27/2016   AST 14 05/27/2016   ALT 9 05/27/2016   PROT 7.9 05/27/2016   ALBUMIN 4.4 05/27/2016   CALCIUM 11.2 (H) 05/27/2016   ANIONGAP 7 04/15/2016   EGFR >90 03/10/2016   GFR 90.37 05/27/2016   Lab Results  Component Value Date   CHOL 218 (H) 05/27/2016   Lab Results  Component Value Date   HDL 53.20 05/27/2016   Lab Results  Component Value Date   LDLCALC 129 (H) 05/27/2016   Lab Results  Component Value Date   TRIG 179.0 (H) 05/27/2016   Lab Results  Component Value Date   CHOLHDL 4  05/27/2016   No results found for: HGBA1C     Assessment & Plan:   Problem List Items Addressed This Visit    None    Visit Diagnoses    Rash    -  Primary   Relevant Medications   predniSONE (DELTASONE) 10 MG tablet   Lower extremity edema       Relevant Medications   furosemide (LASIX) 20 MG tablet    try pred taper-- if rash does not improve consider derm Elevate legs  Compression socks Lasix  rto prn   I have discontinued Ms. Doukas's hydrochlorothiazide. I am also having her start on predniSONE and furosemide. Additionally, I am having her maintain her aspirin, PEGASYS, doxepin, omeprazole, levothyroxine, and amLODipine.  Meds ordered this encounter  Medications  . predniSONE (DELTASONE) 10 MG tablet    Sig: TAKE 3 TABLETS PO QD FOR 3 DAYS THEN TAKE 2 TABLETS PO QD FOR 3 DAYS THEN TAKE 1 TABLET PO QD FOR 3 DAYS THEN TAKE 1/2 TAB PO QD FOR 3 DAYS    Dispense:  20 tablet    Refill:  0  . furosemide (LASIX) 20 MG tablet    Sig: Take 1 tablet (20 mg total) by mouth daily.    Dispense:  30 tablet    Refill:  3    CMA served as scribe during this visit. History, Physical and Plan performed by medical provider. Documentation and orders reviewed and attested to.  Ann Held, DO

## 2016-07-27 NOTE — Patient Instructions (Signed)
Edema Edema is an abnormal buildup of fluids in your bodytissues. Edema is somewhatdependent on gravity to pull the fluid to the lowest place in your body. That makes the condition more common in the legs and thighs (lower extremities). Painless swelling of the feet and ankles is common and becomes more likely as you get older. It is also common in looser tissues, like around your eyes. When the affected area is squeezed, the fluid may move out of that spot and leave a dent for a few moments. This dent is called pitting. What are the causes? There are many possible causes of edema. Eating too much salt and being on your feet or sitting for a long time can cause edema in your legs and ankles. Hot weather may make edema worse. Common medical causes of edema include:  Heart failure.  Liver disease.  Kidney disease.  Weak blood vessels in your legs.  Cancer.  An injury.  Pregnancy.  Some medications.  Obesity.  What are the signs or symptoms? Edema is usually painless.Your skin may look swollen or shiny. How is this diagnosed? Your health care provider may be able to diagnose edema by asking about your medical history and doing a physical exam. You may need to have tests such as X-rays, an electrocardiogram, or blood tests to check for medical conditions that may cause edema. How is this treated? Edema treatment depends on the cause. If you have heart, liver, or kidney disease, you need the treatment appropriate for these conditions. General treatment may include:  Elevation of the affected body part above the level of your heart.  Compression of the affected body part. Pressure from elastic bandages or support stockings squeezes the tissues and forces fluid back into the blood vessels. This keeps fluid from entering the tissues.  Restriction of fluid and salt intake.  Use of a water pill (diuretic). These medications are appropriate only for some types of edema. They pull fluid  out of your body and make you urinate more often. This gets rid of fluid and reduces swelling, but diuretics can have side effects. Only use diuretics as directed by your health care provider.  Follow these instructions at home:  Keep the affected body part above the level of your heart when you are lying down.  Do not sit still or stand for prolonged periods.  Do not put anything directly under your knees when lying down.  Do not wear constricting clothing or garters on your upper legs.  Exercise your legs to work the fluid back into your blood vessels. This may help the swelling go down.  Wear elastic bandages or support stockings to reduce ankle swelling as directed by your health care provider.  Eat a low-salt diet to reduce fluid if your health care provider recommends it.  Only take medicines as directed by your health care provider. Contact a health care provider if:  Your edema is not responding to treatment.  You have heart, liver, or kidney disease and notice symptoms of edema.  You have edema in your legs that does not improve after elevating them.  You have sudden and unexplained weight gain. Get help right away if:  You develop shortness of breath or chest pain.  You cannot breathe when you lie down.  You develop pain, redness, or warmth in the swollen areas.  You have heart, liver, or kidney disease and suddenly get edema.  You have a fever and your symptoms suddenly get worse. This information is   not intended to replace advice given to you by your health care provider. Make sure you discuss any questions you have with your health care provider. Document Released: 03/08/2005 Document Revised: 08/14/2015 Document Reviewed: 12/29/2012 Elsevier Interactive Patient Education  2017 [employer] Inc[employer]  

## 2016-08-12 ENCOUNTER — Other Ambulatory Visit: Payer: Self-pay | Admitting: Family Medicine

## 2016-08-12 DIAGNOSIS — I10 Essential (primary) hypertension: Secondary | ICD-10-CM

## 2016-08-13 ENCOUNTER — Other Ambulatory Visit (HOSPITAL_BASED_OUTPATIENT_CLINIC_OR_DEPARTMENT_OTHER): Payer: Medicaid Other

## 2016-08-13 ENCOUNTER — Ambulatory Visit (INDEPENDENT_AMBULATORY_CARE_PROVIDER_SITE_OTHER): Payer: Medicaid Other | Admitting: Family Medicine

## 2016-08-13 ENCOUNTER — Encounter: Payer: Self-pay | Admitting: Family Medicine

## 2016-08-13 ENCOUNTER — Ambulatory Visit (HOSPITAL_BASED_OUTPATIENT_CLINIC_OR_DEPARTMENT_OTHER): Payer: Medicaid Other | Admitting: Hematology & Oncology

## 2016-08-13 VITALS — BP 138/86 | HR 62 | Temp 97.6°F | Resp 16 | Ht 66.0 in | Wt 244.4 lb

## 2016-08-13 VITALS — BP 134/77 | HR 72 | Temp 98.1°F | Resp 18 | Wt 243.1 lb

## 2016-08-13 DIAGNOSIS — M79604 Pain in right leg: Secondary | ICD-10-CM

## 2016-08-13 DIAGNOSIS — D471 Chronic myeloproliferative disease: Secondary | ICD-10-CM | POA: Diagnosis present

## 2016-08-13 DIAGNOSIS — E038 Other specified hypothyroidism: Secondary | ICD-10-CM

## 2016-08-13 DIAGNOSIS — E041 Nontoxic single thyroid nodule: Secondary | ICD-10-CM

## 2016-08-13 DIAGNOSIS — E876 Hypokalemia: Secondary | ICD-10-CM

## 2016-08-13 DIAGNOSIS — M79605 Pain in left leg: Secondary | ICD-10-CM | POA: Diagnosis not present

## 2016-08-13 DIAGNOSIS — R609 Edema, unspecified: Secondary | ICD-10-CM | POA: Diagnosis not present

## 2016-08-13 LAB — CBC WITH DIFFERENTIAL (CANCER CENTER ONLY)
BASO#: 0 10*3/uL (ref 0.0–0.2)
BASO%: 0.1 % (ref 0.0–2.0)
EOS%: 0.7 % (ref 0.0–7.0)
Eosinophils Absolute: 0.1 10*3/uL (ref 0.0–0.5)
HCT: 42.2 % (ref 34.8–46.6)
HEMOGLOBIN: 13.9 g/dL (ref 11.6–15.9)
LYMPH#: 2.4 10*3/uL (ref 0.9–3.3)
LYMPH%: 14.1 % (ref 14.0–48.0)
MCH: 31.3 pg (ref 26.0–34.0)
MCHC: 32.9 g/dL (ref 32.0–36.0)
MCV: 95 fL (ref 81–101)
MONO#: 0.9 10*3/uL (ref 0.1–0.9)
MONO%: 5 % (ref 0.0–13.0)
NEUT%: 80.1 % — ABNORMAL HIGH (ref 39.6–80.0)
NEUTROS ABS: 13.7 10*3/uL — AB (ref 1.5–6.5)
Platelets: 262 10*3/uL (ref 145–400)
RBC: 4.44 10*6/uL (ref 3.70–5.32)
RDW: 12.7 % (ref 11.1–15.7)
WBC: 17.1 10*3/uL — ABNORMAL HIGH (ref 3.9–10.0)

## 2016-08-13 LAB — CMP (CANCER CENTER ONLY)
ALBUMIN: 3.6 g/dL (ref 3.3–5.5)
ALT(SGPT): 37 U/L (ref 10–47)
AST: 41 U/L — ABNORMAL HIGH (ref 11–38)
Alkaline Phosphatase: 142 U/L — ABNORMAL HIGH (ref 26–84)
BUN: 5 mg/dL — AB (ref 7–22)
CHLORIDE: 103 meq/L (ref 98–108)
CO2: 27 meq/L (ref 18–33)
Calcium: 10.4 mg/dL — ABNORMAL HIGH (ref 8.0–10.3)
Creat: 0.7 mg/dl (ref 0.6–1.2)
GLUCOSE: 131 mg/dL — AB (ref 73–118)
POTASSIUM: 3 meq/L — AB (ref 3.3–4.7)
SODIUM: 137 meq/L (ref 128–145)
TOTAL PROTEIN: 7.7 g/dL (ref 6.4–8.1)
Total Bilirubin: 0.7 mg/dl (ref 0.20–1.60)

## 2016-08-13 LAB — LACTATE DEHYDROGENASE: LDH: 193 U/L (ref 125–245)

## 2016-08-13 MED ORDER — POTASSIUM CHLORIDE ER 20 MEQ PO TBCR: 20.0000 meq | EXTENDED_RELEASE_TABLET | Freq: Every day | ORAL | 4 refills | Status: DC

## 2016-08-13 NOTE — Patient Instructions (Signed)
Ankle-Brachial Index Test The ankle-brachial index (ABI) test is used to find peripheral vascular disease (PVD). PVD is also known as peripheral arterial disease (PAD). PVD is the blocking or hardening of the arteries anywhere within the circulatory system beyond the heart. PVD is caused by cholesterol deposits in your blood vessels (atherosclerosis). These deposits cause arteries to narrow. The delivery of oxygen to your tissues is impaired as a result. This can cause muscle pain and fatigue. This is called claudication. PVD means there may also be buildup of cholesterol in your:  Heart. This increases the risk of heart attacks.  Brain. This increases the risk of strokes. The ankle-brachial index test measures the blood flow in your arms and legs. This test also determines if blood vessels in your leg are narrowed by cholesterol deposits. There are additional causes of a reduced ankle-brachial index, such as inflammation of vessels or a clot in the vessels. However, these are much less common than narrowing due to cholesterol deposits. What is being tested? The test is done while you are lying down and resting. Measurements are taken of the systolic pressure:  In your arm (brachial).  In your ankle at several points along your leg. Systolic pressure is the pressure inside your arteries when your heart pumps. The measurements are taken several times on both sides. Then, the highest systolic pressure of the ankle is divided by the highest brachial systolic pressure. The result is the ankle-brachial pressure ratio, or ABI. Sometimes this test is repeated after you have exercised on a treadmill for five minutes. You may have leg pain during the exercise portion of the test if you suffer from PAD. If the index number drops after exercise, this may show that PAD is present. A normal ABI ratio is between 0.9 and 1.4. A value below 0.9 is considered abnormal. This information is not intended to replace  advice given to you by your health care provider. Make sure you discuss any questions you have with your health care provider. Document Released: 03/12/2004 Document Revised: 08/14/2015 Document Reviewed: 10/12/2013 Elsevier Interactive Patient Education  2017 [employer].

## 2016-08-13 NOTE — Progress Notes (Signed)
Hematology and Oncology Follow Up Visit  Brianna Fletcher [id number] 05-21-1970 46 y.o. 08/13/2016   Principle Diagnosis:  Chronic myeloproliferative syndrome-Triple negative Sub-clinical hypothyroidism  Current Therapy:   Peg-Interferon q weekly - q 3 week dosing - restart on 08/20/2016. Synthroid 0.050 mg po q day     Interim History:  Brianna Fletcher is back for followup. [name] done well with the interferon being dosed every 2 weeks. [name] had no problems with that from my point of view.  Her problem now is that she is having edema and possibly neuropathy in her feet and lower legs. She sees her family doctor. Dr. Etter Sjogren gave her an increased dose of diuretic. She also gave her some prednisone. The fact that she is on amlodipine may increase her risk of peripheral edema.  She is not having any problems with her hands. [name] had no fever. [name] had no bleeding. [name] had no change in bowel or bladder habits.  There's been no rashes.  She did have a nice Mother's Day weekend.  Overall, her performance status is ECOG 1.  Medications:  Current Outpatient Prescriptions:  .  amLODipine (NORVASC) 10 MG tablet, TAKE ONE (1) TABLET BY MOUTH EVERY DAY, Disp: 30 tablet, Rfl: 1 .  aspirin 81 MG tablet, Take 81 mg by mouth daily., Disp: , Rfl:  .  doxepin (SINEQUAN) 25 MG capsule, Take 25 mg by mouth., Disp: , Rfl:  .  furosemide (LASIX) 20 MG tablet, Take 1 tablet (20 mg total) by mouth daily., Disp: 30 tablet, Rfl: 3 .  levothyroxine (SYNTHROID, LEVOTHROID) 50 MCG tablet, Take 1 tablet (50 mcg total) by mouth daily before breakfast., Disp: 30 tablet, Rfl: 3 .  omeprazole (PRILOSEC) 20 MG capsule, TAKE 1 CAPSULE (20 MG TOTAL) BY MOUTH DAILY., Disp: 30 capsule, Rfl: 11 .  PEGASYS 180 MCG/ML injection, Inject 0.45 mLs (81 mcg total) into the skin every 14 (fourteen) days., Disp: 2 mL, Rfl: 3 .  predniSONE (DELTASONE) 10 MG tablet, TAKE 3 TABLETS PO QD FOR 3 DAYS THEN TAKE 2 TABLETS PO QD FOR 3  DAYS THEN TAKE 1 TABLET PO QD FOR 3 DAYS THEN TAKE 1/2 TAB PO QD FOR 3 DAYS, Disp: 20 tablet, Rfl: 0 No current facility-administered medications for this visit.   Facility-Administered Medications Ordered in Other Visits:  .  acetaminophen (TYLENOL) tablet 650 mg, 650 mg, Oral, Once, Billal Rollo R, MD .  peginterferon alfa-2a (PEGASYS) injection 81 mcg, 81 mcg, Subcutaneous, Weekly, [name], MD, 81 mcg at 04/26/14 1530  Allergies:  Allergies  Allergen Reactions  . Amitiza [Lubiprostone]     Made her sick  . Lisinopril Cough  . Losartan Cough    Past Medical History, Surgical history, Social history, and Family History were reviewed and updated.  Review of Systems: As above  Physical Exam:  weight is 243 lb 1.9 oz (110.3 kg). Her oral temperature is 98.1 F (36.7 C). Her blood pressure is 134/77 and her pulse is 72. Her respiration is 18 and oxygen saturation is 100%.   Well-developed and well-nourished white female. Head and neck exam shows no ocular or oral lesions. She has no palpable cervical or supraclavicular lymph nodes. Lungs are clear. Cardiac exam regular rate and rhythm with no murmurs rubs or bruits. Abdomen is soft. She has good bowel sounds. She has healing Brosky be scars. There is no fluid wave. There is no palpable liver or spleen tip. Back exam shows no tenderness over the spine  or ribs. There is some tenderness over the right hip area. This is chronic. She has some slight decreased range of motion of the right hip. Extremities shows no clubbing, cyanosis or edema. She has minimal edema in the left lower leg. No obvious venous cord is palpable. She has a negative Homans's sign in the left leg. She has good pulses in the distal arteries. Right leg is relatively unremarkable. Skin exam no rashes, ecchymosis or petechia. Her skin is a little dry. Neurological exam shows no focal neurological deficits.  Lab Results  Component Value Date   WBC 17.1 (H) 08/13/2016    HGB 13.9 08/13/2016   HCT 42.2 08/13/2016   MCV 95 08/13/2016   PLT 262 08/13/2016     Chemistry      Component Value Date/Time   NA 136 05/27/2016 1630   NA 136 03/10/2016 1147   K 3.9 05/27/2016 1630   K 3.9 03/10/2016 1147   CL 100 05/27/2016 1630   CL 100 12/26/2015 1133   CO2 24 05/27/2016 1630   CO2 24 03/10/2016 1147   BUN 15 05/27/2016 1630   BUN 10.2 03/10/2016 1147   CREATININE 0.73 05/27/2016 1630   CREATININE 0.7 03/10/2016 1147      Component Value Date/Time   CALCIUM 11.2 (H) 05/27/2016 1630   CALCIUM 10.7 (H) 03/10/2016 1147   ALKPHOS 126 (H) 05/27/2016 1630   ALKPHOS 132 03/10/2016 1147   AST 14 05/27/2016 1630   AST 12 03/10/2016 1147   ALT 9 05/27/2016 1630   ALT 8 03/10/2016 1147   BILITOT 0.4 05/27/2016 1630   BILITOT 0.63 03/10/2016 1147         Impression and Plan: Brianna Fletcher is 46 year old female. She has a myeloproliferative syndrome. So far, our workup has been totally negative.  I think we can to her interferon every 3 weeks.  I believe that her blood counts should be able to maintain themselves.  She is agreeable to this.  I will plan to see her back in another 6 weeks.  Hopefully, her feet and lower legs will not be as problematic for her.   [name], MD 5/25/20182:52 PM

## 2016-08-13 NOTE — Addendum Note (Signed)
Addended by: Burney Gauze R on: 08/13/2016 03:01 PM   Modules accepted: Orders

## 2016-08-13 NOTE — Progress Notes (Addendum)
Patient ID: Brianna Fletcher, female   DOB: 1970-07-14, 46 y.o.   MRN: [id number]     Subjective:  I acted as a Education administrator for Dr. Carollee Herter.  Guerry Bruin, [place]   Patient ID: Brianna Fletcher, female    DOB: 06-Mar-1971, 46 y.o.   MRN: [id number]  Chief Complaint  Patient presents with  . Edema    in ankles only since increase in fluid pill.  the redness still comes and goes    HPI  Patient is in today for follow up redness and swelling of lower leg and feet.  Now since we increased the fluid pills the swelling has improved a little, its only on the sides of ankles.  She has had several episodes of the redness which is not visible today. Legs look great now.  Pt c/o pain after walking for any distance that subsides with rest .      Patient Care Team: Carollee Herter, Alferd Apa, DO as PCP - General (Family Medicine)   Past Medical History:  Diagnosis Date  . Arthritis   . Blood dyscrasia    produces too many WBCs  . Chronic neutrophilia 11/15/2012  . Family history of heart disease    mother had stents placed in her 42s and had myocardial infarctions  . Glaucoma   . Hypertension   . Leukocytosis, unspecified 11/15/2012  . Monocytosis 11/15/2012  . Myeloproliferative disorder ([place]) 03/08/2013  . Precordial chest pain   . Pruritus 11/15/2012  . Radial artery occlusion, right (HCC)    status post  right radial artery cardiac catheterization  . Vaginal delivery 1986, 2000    Past Surgical History:  Procedure Laterality Date  . CARDIAC CATHETERIZATION N/A 10/03/2014   Procedure: Left Heart Cath and Coronary Angiography;  Surgeon: Lorretta Harp, MD;  Location: [facility];  Service: Cardiovascular;  Laterality: N/A;  . DILITATION & CURRETTAGE/HYSTROSCOPY WITH NOVASURE ABLATION N/A 11/08/2014   Procedure: DILATATION & CURETTAGE/HYSTEROSCOPY WITH NOVASURE ABLATION;  Surgeon: Linda Hedges, DO;  Location: [facility];  Service: Gynecology;  Laterality: N/A;  . EYE SURGERY     laser eye surgery  .  LAPAROSCOPIC VAGINAL HYSTERECTOMY WITH SALPINGECTOMY Bilateral 03/05/2015   Procedure: LAPAROSCOPIC ASSISTED VAGINAL HYSTERECTOMY WITH SALPINGECTOMY;  Surgeon: Linda Hedges, DO;  Location: [facility];  Service: Gynecology;  Laterality: Bilateral;  . TUBAL LIGATION  2000    Family History  Problem Relation Age of Onset  . Heart disease Mother   . Hypertension Mother   . Hyperlipidemia Mother   . Diabetes Father   . Cancer Father        esopageal ?  . Dementia Maternal Grandmother   . Heart disease Maternal Grandmother   . Heart disease Maternal Grandfather 25       MI  . Heart disease Cousin   . Other Neg Hx        hyperparathyroidism    Social History   Social History  . Marital status: Divorced    Spouse name: N/A  . Number of children: N/A  . Years of education: N/A   Occupational History  . unemployed    Social History Main Topics  . Smoking status: Former Smoker    Types: Cigarettes    Start date: 08/21/2014  . Smokeless tobacco: Never Used     Comment: patient reports light tobacco use, has not smoked since friday (09/27/14)  . Alcohol use 3.6 oz/week    6 Cans of beer per week  . Drug  use: No  . Sexual activity: Not Currently    Partners: Male   Other Topics Concern  . Not on file   Social History Narrative   Very little exercise    Outpatient Medications Prior to Visit  Medication Sig Dispense Refill  . amLODipine (NORVASC) 10 MG tablet TAKE ONE (1) TABLET BY MOUTH EVERY DAY 30 tablet 1  . aspirin 81 MG tablet Take 81 mg by mouth daily.    Marland Kitchen doxepin (SINEQUAN) 25 MG capsule Take 25 mg by mouth.    . furosemide (LASIX) 20 MG tablet Take 1 tablet (20 mg total) by mouth daily. 30 tablet 3  . levothyroxine (SYNTHROID, LEVOTHROID) 50 MCG tablet Take 1 tablet (50 mcg total) by mouth daily before breakfast. 30 tablet 3  . omeprazole (PRILOSEC) 20 MG capsule TAKE 1 CAPSULE (20 MG TOTAL) BY MOUTH DAILY. 30 capsule 11  . PEGASYS 180 MCG/ML injection Inject 0.45 mLs  (81 mcg total) into the skin every 14 (fourteen) days. 2 mL 3  . potassium chloride 20 MEQ TBCR Take 20 mEq by mouth daily. 30 tablet 4  . predniSONE (DELTASONE) 10 MG tablet TAKE 3 TABLETS PO QD FOR 3 DAYS THEN TAKE 2 TABLETS PO QD FOR 3 DAYS THEN TAKE 1 TABLET PO QD FOR 3 DAYS THEN TAKE 1/2 TAB PO QD FOR 3 DAYS 20 tablet 0   Facility-Administered Medications Prior to Visit  Medication Dose Route Frequency Provider Last Rate Last Dose  . acetaminophen (TYLENOL) tablet 650 mg  650 mg Oral Once [name], MD      . peginterferon alfa-2a (PEGASYS) injection 81 mcg  81 mcg Subcutaneous Weekly [name], MD   81 mcg at 04/26/14 1530    Allergies  Allergen Reactions  . Amitiza [Lubiprostone]     Made her sick  . Lisinopril Cough  . Losartan Cough    Review of Systems  Constitutional: Negative for fever and malaise/fatigue.  HENT: Negative for congestion.   Eyes: Negative for blurred vision.  Respiratory: Negative for cough and shortness of breath.   Cardiovascular: Negative for chest pain, palpitations and leg swelling.  Gastrointestinal: Negative for vomiting.  Musculoskeletal: Negative for back pain.  Skin: Negative for rash.  Neurological: Negative for loss of consciousness and headaches.       Objective:    Physical Exam  Constitutional: She is oriented to person, place, and time. She appears well-developed and well-nourished. No distress.  HENT:  Head: Normocephalic and atraumatic.  Eyes: Conjunctivae are normal.  Neck: Normal range of motion. No thyromegaly present.  Cardiovascular: Normal rate and regular rhythm.   Pulmonary/Chest: Effort normal and breath sounds normal. She has no wheezes.  Abdominal: Soft. Bowel sounds are normal. There is no tenderness.  Musculoskeletal: Normal range of motion. She exhibits no edema, tenderness or deformity.  Neurological: She is alert and oriented to person, place, and time.  Skin: Skin is warm and dry. No rash noted.  She is not diaphoretic. No erythema. No pallor.  Psychiatric: She has a normal mood and affect. Her behavior is normal. Judgment and thought content normal.  Nursing note and vitals reviewed.   BP 138/86 (BP Location: Right Arm, Cuff Size: Large)   Pulse 62   Temp 97.6 F (36.4 C) (Oral)   Resp 16   Ht _0  (1.676 m)   Wt 244 lb 6.4 oz (110.9 kg)   SpO2 98%   BMI 39.45 kg/m  Wt Readings from Last 3  Encounters:  08/13/16 244 lb 6.4 oz (110.9 kg)  08/13/16 243 lb 1.9 oz (110.3 kg)  07/27/16 240 lb 6.4 oz (109 kg)   BP Readings from Last 3 Encounters:  08/13/16 138/86  08/13/16 134/77  07/27/16 126/88     Immunization History  Administered Date(s) Administered  . Influenza Split 01/08/2013  . Influenza,inj,Quad PF,36+ Mos 11/29/2014, 02/06/2016  . Tdap 07/01/2014    Health Maintenance  Topic Date Due  . MAMMOGRAM  03/03/2016  . INFLUENZA VACCINE  10/20/2016  . PAP SMEAR  06/30/2017  . TETANUS/TDAP  06/30/2024  . HIV Screening  Completed    Lab Results  Component Value Date   WBC 17.1 (H) 08/13/2016   HGB 13.9 08/13/2016   HCT 42.2 08/13/2016   PLT 262 08/13/2016   GLUCOSE 131 (H) 08/13/2016   CHOL 218 (H) 05/27/2016   TRIG 179.0 (H) 05/27/2016   HDL 53.20 05/27/2016   LDLCALC 129 (H) 05/27/2016   ALT 37 08/13/2016   AST 41 (H) 08/13/2016   NA 137 08/13/2016   K 3.0 (LL) 08/13/2016   CL 103 08/13/2016   CREATININE 0.7 08/13/2016   BUN 5 (L) 08/13/2016   CO2 27 08/13/2016   TSH 0.50 05/27/2016   INR 0.9 05/06/2016   MICROALBUR <0.7 04/23/2016    Lab Results  Component Value Date   TSH 0.50 05/27/2016   Lab Results  Component Value Date   WBC 17.1 (H) 08/13/2016   HGB 13.9 08/13/2016   HCT 42.2 08/13/2016   MCV 95 08/13/2016   PLT 262 08/13/2016   Lab Results  Component Value Date   NA 137 08/13/2016   K 3.0 (LL) 08/13/2016   CHLORIDE 102 03/10/2016   CO2 27 08/13/2016   GLUCOSE 131 (H) 08/13/2016   BUN 5 (L) 08/13/2016   CREATININE 0.7  08/13/2016   BILITOT 0.70 08/13/2016   ALKPHOS 142 (H) 08/13/2016   AST 41 (H) 08/13/2016   ALT 37 08/13/2016   PROT 7.7 08/13/2016   ALBUMIN 3.6 08/13/2016   CALCIUM 10.4 (H) 08/13/2016   ANIONGAP 7 04/15/2016   EGFR >90 03/10/2016   GFR 90.37 05/27/2016   Lab Results  Component Value Date   CHOL 218 (H) 05/27/2016   Lab Results  Component Value Date   HDL 53.20 05/27/2016   Lab Results  Component Value Date   LDLCALC 129 (H) 05/27/2016   Lab Results  Component Value Date   TRIG 179.0 (H) 05/27/2016   Lab Results  Component Value Date   CHOLHDL 4 05/27/2016   No results found for: HGBA1C       Assessment & Plan:   Problem List Items Addressed This Visit    None    Visit Diagnoses    Pain in both lower extremities    -  Primary   Relevant Orders   VAS [place] ABI WITH/WO TBI      I have discontinued Ms. Jaskulski's predniSONE. I am also having her maintain her aspirin, PEGASYS, doxepin, omeprazole, levothyroxine, furosemide, amLODipine, and Potassium Chloride ER.  No orders of the defined types were placed in this encounter.   CMA served as Education administrator during this visit. History, Physical and Plan performed by medical provider. Documentation and orders reviewed and attested to.  Ann Held, DO

## 2016-08-14 LAB — RETICULOCYTES: RETICULOCYTE COUNT: 1.8 % (ref 0.6–2.6)

## 2016-08-17 LAB — TSH: TSH: 0.645 m(IU)/L (ref 0.308–3.960)

## 2016-08-17 LAB — IRON AND TIBC
%SAT: 19 % — ABNORMAL LOW (ref 21–57)
IRON: 62 ug/dL (ref 41–142)
TIBC: 320 ug/dL (ref 236–444)
UIBC: 258 ug/dL (ref 120–384)

## 2016-08-17 LAB — FERRITIN: Ferritin: 124 ng/ml (ref 9–269)

## 2016-08-18 ENCOUNTER — Telehealth: Payer: Self-pay | Admitting: Family Medicine

## 2016-08-18 ENCOUNTER — Other Ambulatory Visit: Payer: Self-pay | Admitting: Family Medicine

## 2016-08-18 DIAGNOSIS — E876 Hypokalemia: Secondary | ICD-10-CM

## 2016-08-18 NOTE — Telephone Encounter (Signed)
Caller name: Elyce Relation to pt: self  Call back number: [phone] Pharmacy:  Reason for call:  Pt was returning call regarding about results. Pt states will be available the rest of the day and we can call today at the tel mentioned above. Please advise.

## 2016-08-18 NOTE — Telephone Encounter (Signed)
Patient informed of results.  

## 2016-08-23 ENCOUNTER — Encounter: Payer: Self-pay | Admitting: Family Medicine

## 2016-08-23 ENCOUNTER — Other Ambulatory Visit: Payer: Self-pay | Admitting: Family Medicine

## 2016-08-23 DIAGNOSIS — Z85828 Personal history of other malignant neoplasm of skin: Secondary | ICD-10-CM

## 2016-08-25 ENCOUNTER — Telehealth: Payer: Self-pay | Admitting: Family Medicine

## 2016-08-25 NOTE — Telephone Encounter (Signed)
Please refer to dermatology pt states Brianna Fletcher access has been removed. Pt called medicaid said it was an error and she corrected it. Pt called this morning to check and it was corrected and is now showing medicaid NOT [place] access. Please make referral to dermatology.

## 2016-08-26 NOTE — Telephone Encounter (Signed)
Verified ins and it is back to traditional MCD, LM for pt that she should be able to call  Derm to schedule.

## 2016-08-31 ENCOUNTER — Telehealth: Payer: Self-pay | Admitting: Family Medicine

## 2016-08-31 DIAGNOSIS — E876 Hypokalemia: Secondary | ICD-10-CM

## 2016-08-31 DIAGNOSIS — D471 Chronic myeloproliferative disease: Secondary | ICD-10-CM

## 2016-08-31 MED ORDER — POTASSIUM CHLORIDE ER 20 MEQ PO TBCR: 20.0000 meq | EXTENDED_RELEASE_TABLET | Freq: Two times a day (BID) | ORAL | 2 refills | Status: DC

## 2016-08-31 NOTE — Telephone Encounter (Signed)
Updated medication list and sent into pharmacy Patient informed

## 2016-08-31 NOTE — Telephone Encounter (Signed)
Please change k to 2 a day #60  2 refills

## 2016-08-31 NOTE — Telephone Encounter (Signed)
Clarification on potassium Dr. Marin Olp told her to take potassium once daily. She states PCP informed her to take 2 per day of potassium, which she has been doing. Now she is out and pharmacy will not fill as it states to take once daily and needs to be changed to twice daily if this is correct.

## 2016-09-07 ENCOUNTER — Other Ambulatory Visit: Payer: Self-pay | Admitting: *Deleted

## 2016-09-07 DIAGNOSIS — M2550 Pain in unspecified joint: Secondary | ICD-10-CM | POA: Insufficient documentation

## 2016-09-07 DIAGNOSIS — D72828 Other elevated white blood cell count: Secondary | ICD-10-CM

## 2016-09-07 DIAGNOSIS — D471 Chronic myeloproliferative disease: Secondary | ICD-10-CM

## 2016-09-07 DIAGNOSIS — E038 Other specified hypothyroidism: Secondary | ICD-10-CM

## 2016-09-07 DIAGNOSIS — D509 Iron deficiency anemia, unspecified: Secondary | ICD-10-CM

## 2016-09-07 DIAGNOSIS — R5382 Chronic fatigue, unspecified: Secondary | ICD-10-CM

## 2016-09-07 MED ORDER — PEGASYS 180 MCG/ML ~~LOC~~ SOLN: 81.0000 ug | SUBCUTANEOUS | 3 refills | Status: DC

## 2016-09-09 ENCOUNTER — Other Ambulatory Visit: Payer: Medicaid Other

## 2016-09-09 ENCOUNTER — Encounter (HOSPITAL_COMMUNITY): Payer: Medicaid Other

## 2016-09-10 ENCOUNTER — Ambulatory Visit: Payer: Medicaid Other | Admitting: Hematology & Oncology

## 2016-09-10 ENCOUNTER — Other Ambulatory Visit: Payer: Medicaid Other

## 2016-09-13 MED FILL — PEGASYS 180 MCG/ML VIAL: 180 | 30 days supply | Qty: 2 | Fill #0

## 2016-09-14 ENCOUNTER — Ambulatory Visit (HOSPITAL_COMMUNITY)
Admission: RE | Admit: 2016-09-14 | Discharge: 2016-09-14 | Disposition: A | Discharge status: Home / Self Care | Payer: Medicaid Other | Source: Ambulatory Visit | Attending: Family Medicine | Admitting: Family Medicine

## 2016-09-14 DIAGNOSIS — M79605 Pain in left leg: Secondary | ICD-10-CM

## 2016-09-14 DIAGNOSIS — M79604 Pain in right leg: Secondary | ICD-10-CM | POA: Diagnosis present

## 2016-09-23 ENCOUNTER — Ambulatory Visit (HOSPITAL_BASED_OUTPATIENT_CLINIC_OR_DEPARTMENT_OTHER): Payer: Medicaid Other | Admitting: Hematology & Oncology

## 2016-09-23 ENCOUNTER — Other Ambulatory Visit (HOSPITAL_BASED_OUTPATIENT_CLINIC_OR_DEPARTMENT_OTHER): Payer: Medicaid Other

## 2016-09-23 VITALS — BP 145/77 | HR 77 | Temp 98.0°F | Resp 18 | Wt 240.0 lb

## 2016-09-23 DIAGNOSIS — E032 Hypothyroidism due to medicaments and other exogenous substances: Secondary | ICD-10-CM

## 2016-09-23 DIAGNOSIS — D471 Chronic myeloproliferative disease: Secondary | ICD-10-CM | POA: Diagnosis present

## 2016-09-23 DIAGNOSIS — E039 Hypothyroidism, unspecified: Secondary | ICD-10-CM | POA: Insufficient documentation

## 2016-09-23 DIAGNOSIS — E876 Hypokalemia: Secondary | ICD-10-CM

## 2016-09-23 LAB — CBC WITH DIFFERENTIAL (CANCER CENTER ONLY)
BASO#: 0 10*3/uL (ref 0.0–0.2)
BASO%: 0.2 % (ref 0.0–2.0)
EOS%: 0.5 % (ref 0.0–7.0)
Eosinophils Absolute: 0.1 10*3/uL (ref 0.0–0.5)
HCT: 41 % (ref 34.8–46.6)
HGB: 14 g/dL (ref 11.6–15.9)
LYMPH#: 2.8 10*3/uL (ref 0.9–3.3)
LYMPH%: 19.1 % (ref 14.0–48.0)
MCH: 31.2 pg (ref 26.0–34.0)
MCHC: 34.1 g/dL (ref 32.0–36.0)
MCV: 91 fL (ref 81–101)
MONO#: 0.7 10*3/uL (ref 0.1–0.9)
MONO%: 5 % (ref 0.0–13.0)
NEUT#: 11.1 10*3/uL — ABNORMAL HIGH (ref 1.5–6.5)
NEUT%: 75.2 % (ref 39.6–80.0)
PLATELETS: 244 10*3/uL (ref 145–400)
RBC: 4.49 10*6/uL (ref 3.70–5.32)
RDW: 13 % (ref 11.1–15.7)
WBC: 14.7 10*3/uL — ABNORMAL HIGH (ref 3.9–10.0)

## 2016-09-23 LAB — CMP (CANCER CENTER ONLY)
ALK PHOS: 117 U/L — AB (ref 26–84)
ALT: 19 U/L (ref 10–47)
AST: 23 U/L (ref 11–38)
Albumin: 3.8 g/dL (ref 3.3–5.5)
BILIRUBIN TOTAL: 0.8 mg/dL (ref 0.20–1.60)
BUN: 6 mg/dL — AB (ref 7–22)
CO2: 25 mEq/L (ref 18–33)
Calcium: 10.4 mg/dL — ABNORMAL HIGH (ref 8.0–10.3)
Chloride: 106 mEq/L (ref 98–108)
Creat: 1 mg/dl (ref 0.6–1.2)
GLUCOSE: 98 mg/dL (ref 73–118)
POTASSIUM: 3.6 meq/L (ref 3.3–4.7)
Sodium: 136 mEq/L (ref 128–145)
Total Protein: 7.7 g/dL (ref 6.4–8.1)

## 2016-09-23 LAB — CHCC SATELLITE - SMEAR

## 2016-09-23 NOTE — Progress Notes (Signed)
Hematology and Oncology Follow Up Visit  Brianna Fletcher [id number] [birth year] 46 y.o. 09/23/2016   Principle Diagnosis:  Chronic myeloproliferative syndrome-Triple negative Sub-clinical hypothyroidism (+) p-ANCA  Current Therapy:   Peg-Interferon q weekly - q 4 week dosing - restart on 10/16/2016. Synthroid 0.050 mg po q day     Interim History:  Ms.  Brianna Fletcher is back for followup. She is doing okay. She had been on the interferon every 3 weeks. She has had no problems with this.  She did see a rheumatologist. This was a rheumatologist in [facility]. Obviously, this rheumatologist was incredibly thorough. She is Dr. Gerilyn Fletcher. In one of the tests, she found that Brianna Fletcher is positive for p-ANCA. She had chest x-ray which was normal. Show urinalysis which may have shown an infection.  [name] also had vascular studies done. She does not have the results back yet.   Her youngest son graduated high school month ago. It was amazing that [name] reunited her son with his father. His father walked out 15 years ago. It was a very nice reunion. Unfortunately, his father now has pancreatic cancer.   She has had no rashes. She has had no leg swelling. She has had no diarrhea. His been no dysuria. [name] had no cough.   She had a fairly quiet July 4 holiday yesterday.   Overall, her performance status is ECOG 1.  Medications:  Current Outpatient Prescriptions:  .  amLODipine (NORVASC) 10 MG tablet, TAKE ONE (1) TABLET BY MOUTH EVERY DAY, Disp: 30 tablet, Rfl: 1 .  aspirin 81 MG tablet, Take 81 mg by mouth daily., Disp: , Rfl:  .  doxepin (SINEQUAN) 25 MG capsule, Take 25 mg by mouth., Disp: , Rfl:  .  furosemide (LASIX) 20 MG tablet, Take 1 tablet (20 mg total) by mouth daily., Disp: 30 tablet, Rfl: 3 .  levothyroxine (SYNTHROID, LEVOTHROID) 50 MCG tablet, Take 1 tablet (50 mcg total) by mouth daily before breakfast., Disp: 30 tablet, Rfl: 3 .  omeprazole (PRILOSEC) 20 MG capsule,  TAKE 1 CAPSULE (20 MG TOTAL) BY MOUTH DAILY., Disp: 30 capsule, Rfl: 11 .  PEGASYS 180 MCG/ML injection, Inject 0.45 mLs (81 mcg total) into the skin every 14 (fourteen) days., Disp: 2 mL, Rfl: 3 .  Potassium Chloride ER 20 MEQ TBCR, Take 20 mEq by mouth 2 (two) times daily., Disp: 60 tablet, Rfl: 2 No current facility-administered medications for this visit.   Facility-Administered Medications Ordered in Other Visits:  .  acetaminophen (TYLENOL) tablet 650 mg, 650 mg, Oral, Once, Ennever, Peter R, MD .  peginterferon alfa-2a (PEGASYS) injection 81 mcg, 81 mcg, Subcutaneous, Weekly, [name], MD, 81 mcg at 04/26/14 1530  Allergies:  Allergies  Allergen Reactions  . Amitiza [Lubiprostone]     Made her sick  . Lisinopril Cough  . Losartan Cough    Past Medical History, Surgical history, Social history, and Family History were reviewed and updated.  Review of Systems: As above  Physical Exam:  weight is 240 lb (108.9 kg). Her oral temperature is 98 F (36.7 C). Her blood pressure is 145/77 (abnormal) and her pulse is 77. Her respiration is 18 and oxygen saturation is 100%.   Well-developed and well-nourished white female. Head and neck exam shows no ocular or oral lesions. She has no palpable cervical or supraclavicular lymph nodes. Lungs are clear. Cardiac exam regular rate and rhythm with no murmurs rubs or bruits. Abdomen is soft. She has good bowel  sounds. She has healing Brosky be scars. There is no fluid wave. There is no palpable liver or spleen tip. Back exam shows no tenderness over the spine or ribs. There is some tenderness over the right hip area. This is chronic. She has some slight decreased range of motion of the right hip. Extremities shows no clubbing, cyanosis or edema. She has minimal edema in the left lower leg. No obvious venous cord is palpable. She has a negative Homans's sign in the left leg. She has good pulses in the distal arteries. Right leg is relatively  unremarkable. Skin exam no rashes, ecchymosis or petechia. Her skin is a little dry. Neurological exam shows no focal neurological deficits.  Lab Results  Component Value Date   WBC 14.7 (H) 09/23/2016   HGB 14.0 09/23/2016   HCT 41.0 09/23/2016   MCV 91 09/23/2016   PLT 244 09/23/2016     Chemistry      Component Value Date/Time   NA 136 09/23/2016 1513   NA 136 03/10/2016 1147   K 3.6 09/23/2016 1513   K 3.9 03/10/2016 1147   CL 106 09/23/2016 1513   CO2 25 09/23/2016 1513   CO2 24 03/10/2016 1147   BUN 6 (L) 09/23/2016 1513   BUN 10.2 03/10/2016 1147   CREATININE 1.0 09/23/2016 1513   CREATININE 0.7 03/10/2016 1147      Component Value Date/Time   CALCIUM 10.4 (H) 09/23/2016 1513   CALCIUM 10.7 (H) 03/10/2016 1147   ALKPHOS 117 (H) 09/23/2016 1513   ALKPHOS 132 03/10/2016 1147   AST 23 09/23/2016 1513   AST 12 03/10/2016 1147   ALT 19 09/23/2016 1513   ALT 8 03/10/2016 1147   BILITOT 0.80 09/23/2016 1513   BILITOT 0.63 03/10/2016 1147         Impression and Plan: Brianna Fletcher is 46 year old female. She has a myeloproliferative syndrome. So far, our workup has been totally negative.  I think we can change her interval to monthly now. With her blood counts doing so well, I think that once a month peg-interferon would still be effective.  I am incredibly impressed with the fact that she reunited her son with his father area and it is been 15 years. She really showed an incredible amount of strength in doing this.   I will see her back in 2 months. Hopefully, there will be a formal diagnosis of vasculitis that she will have given that she is positive for p-ANCA.  Marland Kitchen [name], MD 7/5/20184:54 PM

## 2016-09-24 ENCOUNTER — Other Ambulatory Visit: Payer: Self-pay | Admitting: *Deleted

## 2016-09-24 DIAGNOSIS — M25542 Pain in joints of left hand: Secondary | ICD-10-CM

## 2016-09-24 DIAGNOSIS — R5383 Other fatigue: Secondary | ICD-10-CM

## 2016-09-24 DIAGNOSIS — D508 Other iron deficiency anemias: Secondary | ICD-10-CM

## 2016-09-24 DIAGNOSIS — M25541 Pain in joints of right hand: Secondary | ICD-10-CM

## 2016-09-24 LAB — IRON AND TIBC
%SAT: 28 % (ref 21–57)
IRON: 83 ug/dL (ref 41–142)
TIBC: 293 ug/dL (ref 236–444)
UIBC: 209 ug/dL (ref 120–384)

## 2016-09-24 LAB — LACTATE DEHYDROGENASE: LDH: 171 U/L (ref 125–245)

## 2016-09-24 LAB — FERRITIN: Ferritin: 86 ng/ml (ref 9–269)

## 2016-09-24 LAB — RETICULOCYTES: Reticulocyte Count: 1.3 % (ref 0.6–2.6)

## 2016-09-24 MED ORDER — DOXEPIN HCL 25 MG PO CAPS: 50.0000 mg | ORAL_CAPSULE | Freq: Every day | ORAL | 3 refills | Status: DC

## 2016-09-27 ENCOUNTER — Encounter: Payer: Self-pay | Admitting: Family Medicine

## 2016-09-27 NOTE — Telephone Encounter (Signed)
I don't see vascular US that was recently done-- when was it done ?

## 2016-09-27 NOTE — Telephone Encounter (Signed)
It was normal

## 2016-09-29 MED FILL — CIPROFLOXACIN HCL 250 MG TA: 250 | 7 days supply | Qty: 14 | Fill #0

## 2016-10-01 ENCOUNTER — Other Ambulatory Visit: Payer: Self-pay | Admitting: Family Medicine

## 2016-10-01 DIAGNOSIS — I10 Essential (primary) hypertension: Secondary | ICD-10-CM

## 2016-10-12 MED FILL — PEGASYS 180 MCG/ML VIAL: 180 | 30 days supply | Qty: 2 | Fill #1

## 2016-10-21 ENCOUNTER — Other Ambulatory Visit: Payer: Self-pay | Admitting: Orthopedic Surgery

## 2016-10-22 ENCOUNTER — Other Ambulatory Visit: Payer: Self-pay | Admitting: Orthopedic Surgery

## 2016-10-22 DIAGNOSIS — M545 Low back pain, unspecified: Secondary | ICD-10-CM

## 2016-10-22 DIAGNOSIS — M533 Sacrococcygeal disorders, not elsewhere classified: Secondary | ICD-10-CM

## 2016-10-27 ENCOUNTER — Other Ambulatory Visit: Payer: Self-pay | Admitting: Orthopedic Surgery

## 2016-10-27 DIAGNOSIS — M545 Low back pain: Principal | ICD-10-CM

## 2016-10-27 DIAGNOSIS — M533 Sacrococcygeal disorders, not elsewhere classified: Secondary | ICD-10-CM

## 2016-10-27 DIAGNOSIS — G8929 Other chronic pain: Secondary | ICD-10-CM

## 2016-11-03 ENCOUNTER — Other Ambulatory Visit: Payer: Self-pay | Admitting: *Deleted

## 2016-11-03 DIAGNOSIS — M25542 Pain in joints of left hand: Secondary | ICD-10-CM

## 2016-11-03 DIAGNOSIS — D508 Other iron deficiency anemias: Secondary | ICD-10-CM

## 2016-11-03 DIAGNOSIS — M25541 Pain in joints of right hand: Secondary | ICD-10-CM

## 2016-11-03 DIAGNOSIS — R5383 Other fatigue: Secondary | ICD-10-CM

## 2016-11-03 MED ORDER — DOXEPIN HCL 25 MG PO CAPS: 50.0000 mg | ORAL_CAPSULE | Freq: Every day | ORAL | 3 refills | Status: DC

## 2016-11-12 ENCOUNTER — Other Ambulatory Visit: Payer: Self-pay | Admitting: *Deleted

## 2016-11-12 DIAGNOSIS — D471 Chronic myeloproliferative disease: Secondary | ICD-10-CM

## 2016-11-12 DIAGNOSIS — R5382 Chronic fatigue, unspecified: Secondary | ICD-10-CM

## 2016-11-12 DIAGNOSIS — D509 Iron deficiency anemia, unspecified: Secondary | ICD-10-CM

## 2016-11-12 DIAGNOSIS — E038 Other specified hypothyroidism: Secondary | ICD-10-CM

## 2016-11-12 DIAGNOSIS — D72828 Other elevated white blood cell count: Secondary | ICD-10-CM

## 2016-11-12 MED ORDER — PEGASYS 180 MCG/ML ~~LOC~~ SOLN: 81.0000 ug | SUBCUTANEOUS | 3 refills | Status: DC

## 2016-11-16 ENCOUNTER — Other Ambulatory Visit: Payer: Self-pay | Admitting: Family Medicine

## 2016-11-16 DIAGNOSIS — D471 Chronic myeloproliferative disease: Secondary | ICD-10-CM

## 2016-11-16 DIAGNOSIS — R600 Localized edema: Secondary | ICD-10-CM

## 2016-11-16 DIAGNOSIS — E876 Hypokalemia: Secondary | ICD-10-CM

## 2016-11-16 DIAGNOSIS — E041 Nontoxic single thyroid nodule: Secondary | ICD-10-CM

## 2016-11-16 MED ORDER — FUROSEMIDE 20 MG PO TABS: 20.0000 mg | ORAL_TABLET | Freq: Every day | ORAL | 1 refills | Status: DC

## 2016-11-16 MED ORDER — LEVOTHYROXINE SODIUM 50 MCG PO TABS: 50.0000 ug | ORAL_TABLET | Freq: Every day | ORAL | 1 refills | Status: DC

## 2016-11-16 MED ORDER — POTASSIUM CHLORIDE ER 20 MEQ PO TBCR: 20.0000 meq | EXTENDED_RELEASE_TABLET | Freq: Two times a day (BID) | ORAL | 1 refills | Status: DC

## 2016-11-16 NOTE — Telephone Encounter (Signed)
Self   Refill for  furosemide,levothyroxine,  Potassium Chloride ER 20 MEQ     Pharmacy: [id number] - [place], [place] - [address]

## 2016-11-16 NOTE — Telephone Encounter (Signed)
[facility]

## 2016-11-19 ENCOUNTER — Other Ambulatory Visit (HOSPITAL_BASED_OUTPATIENT_CLINIC_OR_DEPARTMENT_OTHER): Payer: Self-pay

## 2016-11-19 ENCOUNTER — Ambulatory Visit (HOSPITAL_BASED_OUTPATIENT_CLINIC_OR_DEPARTMENT_OTHER): Payer: Medicaid Other | Admitting: Hematology & Oncology

## 2016-11-19 VITALS — BP 126/55 | HR 69 | Temp 98.3°F | Resp 18 | Wt 235.0 lb

## 2016-11-19 DIAGNOSIS — E038 Other specified hypothyroidism: Secondary | ICD-10-CM | POA: Diagnosis not present

## 2016-11-19 DIAGNOSIS — D471 Chronic myeloproliferative disease: Secondary | ICD-10-CM

## 2016-11-19 DIAGNOSIS — E032 Hypothyroidism due to medicaments and other exogenous substances: Secondary | ICD-10-CM

## 2016-11-19 LAB — CHCC SATELLITE - SMEAR

## 2016-11-19 LAB — CBC WITH DIFFERENTIAL (CANCER CENTER ONLY)
BASO#: 0 10*3/uL (ref 0.0–0.2)
BASO%: 0.1 % (ref 0.0–2.0)
EOS ABS: 0.1 10*3/uL (ref 0.0–0.5)
EOS%: 0.8 % (ref 0.0–7.0)
HEMATOCRIT: 41.7 % (ref 34.8–46.6)
HEMOGLOBIN: 13.8 g/dL (ref 11.6–15.9)
LYMPH#: 2.8 10*3/uL (ref 0.9–3.3)
LYMPH%: 16.5 % (ref 14.0–48.0)
MCH: 30.2 pg (ref 26.0–34.0)
MCHC: 33.1 g/dL (ref 32.0–36.0)
MCV: 91 fL (ref 81–101)
MONO#: 0.8 10*3/uL (ref 0.1–0.9)
MONO%: 4.9 % (ref 0.0–13.0)
NEUT%: 77.7 % (ref 39.6–80.0)
NEUTROS ABS: 13.4 10*3/uL — AB (ref 1.5–6.5)
Platelets: 285 10*3/uL (ref 145–400)
RBC: 4.57 10*6/uL (ref 3.70–5.32)
RDW: 13.1 % (ref 11.1–15.7)
WBC: 17.2 10*3/uL — AB (ref 3.9–10.0)

## 2016-11-19 LAB — CMP (CANCER CENTER ONLY)
ALBUMIN: 4.3 g/dL (ref 3.3–5.5)
ALT(SGPT): 18 U/L (ref 10–47)
AST: 25 U/L (ref 11–38)
Alkaline Phosphatase: 126 U/L — ABNORMAL HIGH (ref 26–84)
BUN, Bld: 8 mg/dL (ref 7–22)
CHLORIDE: 104 meq/L (ref 98–108)
CO2: 27 mEq/L (ref 18–33)
CREATININE: 1 mg/dL (ref 0.6–1.2)
Calcium: 10.7 mg/dL — ABNORMAL HIGH (ref 8.0–10.3)
Glucose, Bld: 97 mg/dL (ref 73–118)
POTASSIUM: 3.7 meq/L (ref 3.3–4.7)
Sodium: 139 mEq/L (ref 128–145)
TOTAL PROTEIN: 7.9 g/dL (ref 6.4–8.1)
Total Bilirubin: 0.8 mg/dl (ref 0.20–1.60)

## 2016-11-19 LAB — TECHNOLOGIST REVIEW CHCC SATELLITE

## 2016-11-19 NOTE — Progress Notes (Signed)
Hematology and Oncology Follow Up Visit  Brianna Fletcher [id number] 10/21/1970 46 y.o. 11/19/2016   Principle Diagnosis:  Chronic myeloproliferative syndrome-Triple negative Sub-clinical hypothyroidism (+) p-ANCA  Current Therapy:   Peg-Interferon q weekly - q 4 week dosing - restart on 10/16/2016. Synthroid 0.050 mg po q day     Interim History:  Ms.  Brianna Fletcher is back for followup. She is doing okay. She is a little emotional today. Apparently, she had an argument with her son. This has upset her quite a bit.  She did see Dr. Lynann Bologna of orthopedic surgery. He was able to find out her back problem. Unfortunately, because she ran out of insurance, he has not been able to help her. Once she can get some insurance, he will be able to help. She is tolerating her lower back discomfort.  She has had no fever. [name] had no rash. [name] had no pruritus. Has been no cough. [name] had no change in bowel or bladder habits.  There's been no leg swelling. She does have occasional pain in her bones.  She has had no headache.  She is still not able to work because of her back.  Currently, her performance status is ECOG 1.  Medications:  Current Outpatient Prescriptions:  .  amLODipine (NORVASC) 10 MG tablet, TAKE ONE TABLET BY MOUTH DAILY, Disp: 30 tablet, Rfl: 2 .  aspirin 81 MG tablet, Take 81 mg by mouth daily., Disp: , Rfl:  .  doxepin (SINEQUAN) 25 MG capsule, Take 2 capsules (50 mg total) by mouth at bedtime., Disp: 60 capsule, Rfl: 3 .  furosemide (LASIX) 20 MG tablet, Take 1 tablet (20 mg total) by mouth daily., Disp: 30 tablet, Rfl: 1 .  levothyroxine (SYNTHROID, LEVOTHROID) 50 MCG tablet, Take 1 tablet (50 mcg total) by mouth daily before breakfast., Disp: 30 tablet, Rfl: 1 .  omeprazole (PRILOSEC) 20 MG capsule, TAKE 1 CAPSULE (20 MG TOTAL) BY MOUTH DAILY., Disp: 30 capsule, Rfl: 11 .  PEGASYS 180 MCG/ML injection, Inject 0.45 mLs (81 mcg total) into the skin every 14 (fourteen) days.,  Disp: 2 mL, Rfl: 3 .  Potassium Chloride ER 20 MEQ TBCR, Take 20 mEq by mouth 2 (two) times daily., Disp: 60 tablet, Rfl: 1 No current facility-administered medications for this visit.   Facility-Administered Medications Ordered in Other Visits:  .  acetaminophen (TYLENOL) tablet 650 mg, 650 mg, Oral, Once, Raheim Beutler R, MD .  peginterferon alfa-2a (PEGASYS) injection 81 mcg, 81 mcg, Subcutaneous, Weekly, [name], MD, 81 mcg at 04/26/14 1530  Allergies:  Allergies  Allergen Reactions  . Amitiza [Lubiprostone]     Made her sick  . Lisinopril Cough  . Losartan Cough    Past Medical History, Surgical history, Social history, and Family History were reviewed and updated.  Review of Systems: As stated in the interim history  Physical Exam:  weight is 235 lb (106.6 kg). Her oral temperature is 98.3 F (36.8 C). Her blood pressure is 126/55 (abnormal) and her pulse is 69. Her respiration is 18 and oxygen saturation is 99%.   Physical Exam  Constitutional: She is oriented to person, place, and time.  HENT:  Head: Normocephalic and atraumatic.  Mouth/Throat: Oropharynx is clear and moist.  Eyes: Pupils are equal, round, and reactive to light. EOM are normal.  Neck: Normal range of motion.  Cardiovascular: Normal rate, regular rhythm and normal heart sounds.   Pulmonary/Chest: Effort normal and breath sounds normal.  Abdominal: Soft. Bowel sounds are  normal.  Musculoskeletal: Normal range of motion. She exhibits no edema, tenderness or deformity.  Lymphadenopathy:    She has no cervical adenopathy.  Neurological: She is alert and oriented to person, place, and time.  Skin: Skin is warm and dry. No rash noted. No erythema.  Psychiatric: She has a normal mood and affect. Her behavior is normal. Judgment and thought content normal.  Vitals reviewed.    Lab Results  Component Value Date   WBC 17.2 (H) 11/19/2016   HGB 13.8 11/19/2016   HCT 41.7 11/19/2016   MCV 91  11/19/2016   PLT 285 11/19/2016     Chemistry      Component Value Date/Time   NA 139 11/19/2016 1533   NA 136 03/10/2016 1147   K 3.7 11/19/2016 1533   K 3.9 03/10/2016 1147   CL 104 11/19/2016 1533   CO2 27 11/19/2016 1533   CO2 24 03/10/2016 1147   BUN 8 11/19/2016 1533   BUN 10.2 03/10/2016 1147   CREATININE 1.0 11/19/2016 1533   CREATININE 0.7 03/10/2016 1147      Component Value Date/Time   CALCIUM 10.7 (H) 11/19/2016 1533   CALCIUM 10.7 (H) 03/10/2016 1147   ALKPHOS 126 (H) 11/19/2016 1533   ALKPHOS 132 03/10/2016 1147   AST 25 11/19/2016 1533   AST 12 03/10/2016 1147   ALT 18 11/19/2016 1533   ALT 8 03/10/2016 1147   BILITOT 0.80 11/19/2016 1533   BILITOT 0.63 03/10/2016 1147         Impression and Plan: [name] is 46 year old female. She has a myeloproliferative syndrome. So far, our workup has been totally negative.  We will keep her on monthly PEG- interferon. I think this would be still the way to go.  It does not look like she has any obvious rheumatologic condition.  I'm glad that the spinal surgeon was able to see her and try to help her. Unfortunately, she does not have any insurance and will have to somehow get insurance before the orthopedist can help out.  I would like to see her back in one month.  [name], MD 8/31/20184:45 PM

## 2016-11-23 ENCOUNTER — Telehealth: Payer: Self-pay | Admitting: *Deleted

## 2016-11-23 LAB — LACTATE DEHYDROGENASE: LDH: 177 U/L (ref 125–245)

## 2016-11-23 LAB — TSH: TSH: 0.776 m[IU]/L (ref 0.308–3.960)

## 2016-11-23 NOTE — Telephone Encounter (Addendum)
Patient is aware of results  ----- Message from [name], MD sent at 11/23/2016 11:38 AM EDT ----- Call - thyroid level is ok!!  Brianna Fletcher

## 2016-11-30 ENCOUNTER — Ambulatory Visit: Payer: Medicaid Other | Admitting: Family Medicine

## 2016-11-30 ENCOUNTER — Telehealth: Payer: Self-pay | Admitting: Family Medicine

## 2016-11-30 NOTE — Telephone Encounter (Signed)
No charge. 

## 2016-11-30 NOTE — Telephone Encounter (Signed)
Pt has an apt scheduled for today. Pt says that she lost her insurance. I did advise that she could still be seen and be billed (per office manager) pt says that she is grateful but would like to see if she could come up with the money for visit. Pt rescheduled her apt for next Thursday and said that she will try to come up with visit money and if she cant she will still come in and be billed.    Should pt be charged for missing this apt?

## 2016-12-01 ENCOUNTER — Telehealth: Payer: Self-pay | Admitting: Family Medicine

## 2016-12-01 MED ORDER — OMEPRAZOLE 20 MG PO CPDR: DELAYED_RELEASE_CAPSULE | ORAL | 1 refills | Status: DC

## 2016-12-01 NOTE — Telephone Encounter (Signed)
Refills sent. Pt does have an upcoming pending ov with Dr. Etter Sjogren

## 2016-12-01 NOTE — Telephone Encounter (Signed)
Relation to IR:JJOA Call back [phone] [id number] [place], [place] [address] [phone] (Phone) [phone] (Fax)     Reason for call:  Patient requesting a refill omeprazole (PRILOSEC) 20 MG capsule

## 2016-12-09 ENCOUNTER — Encounter: Payer: Self-pay | Admitting: Family Medicine

## 2016-12-09 ENCOUNTER — Ambulatory Visit (INDEPENDENT_AMBULATORY_CARE_PROVIDER_SITE_OTHER): Payer: Self-pay | Admitting: Family Medicine

## 2016-12-09 VITALS — BP 126/84 | HR 68 | Temp 97.7°F | Ht 66.0 in | Wt 229.4 lb

## 2016-12-09 DIAGNOSIS — L659 Nonscarring hair loss, unspecified: Secondary | ICD-10-CM

## 2016-12-09 DIAGNOSIS — I10 Essential (primary) hypertension: Secondary | ICD-10-CM

## 2016-12-09 DIAGNOSIS — E785 Hyperlipidemia, unspecified: Secondary | ICD-10-CM

## 2016-12-09 LAB — THYROID PANEL WITH TSH
Free Thyroxine Index: 3 (ref 1.4–3.8)
T3 Uptake: 24 % (ref 22–35)
T4, Total: 12.4 ug/dL — ABNORMAL HIGH (ref 5.1–11.9)
TSH: 0.92 m[IU]/L

## 2016-12-09 NOTE — Assessment & Plan Note (Signed)
Well controlled, no changes to meds. Encouraged heart healthy diet such as the DASH diet and exercise as tolerated.  °

## 2016-12-09 NOTE — Progress Notes (Signed)
Patient ID: Brianna Fletcher, female    DOB: 23-Jan-1971  Age: 46 y.o. MRN: [id number]    Subjective:  Subjective  HPI Brianna Fletcher presents for f.u.  Pt c/o hair falling out  Review of Systems  Constitutional: Negative for activity change, appetite change, fatigue and unexpected weight change.  Respiratory: Negative for cough and shortness of breath.   Cardiovascular: Negative for chest pain and palpitations.  Psychiatric/Behavioral: Negative for behavioral problems and dysphoric mood. The patient is not nervous/anxious.     History Past Medical History:  Diagnosis Date  . Arthritis   . Blood dyscrasia    produces too many WBCs  . Chronic neutrophilia 11/15/2012  . Family history of heart disease    mother had stents placed in her 1s and had myocardial infarctions  . Glaucoma   . Hypertension   . Leukocytosis, unspecified 11/15/2012  . Monocytosis 11/15/2012  . Myeloproliferative disorder ([place]) 03/08/2013  . Precordial chest pain   . Pruritus 11/15/2012  . Radial artery occlusion, right (HCC)    status post  right radial artery cardiac catheterization  . Vaginal delivery 1986, 2000    She has a past surgical history that includes Tubal ligation (2000); Eye surgery; Cardiac catheterization (N/A, 10/03/2014); Dilatation & currettage/hysteroscopy with novasure ablation (N/A, 11/08/2014); and Laparoscopic vaginal hysterectomy with salpingectomy (Bilateral, 03/05/2015).   Her family history includes Cancer in her father; Dementia in her maternal grandmother; Diabetes in her father; Heart disease in her cousin, maternal grandmother, and mother; Heart disease (age of onset: 57) in her maternal grandfather; Hyperlipidemia in her mother; Hypertension in her mother.She reports that she has quit smoking. Her smoking use included Cigarettes. She started smoking about 2 years ago. She has never used smokeless tobacco. She reports that she drinks about 3.6 oz of alcohol per week . She reports that  she does not use drugs.  Current Outpatient Prescriptions on File Prior to Visit  Medication Sig Dispense Refill  . amLODipine (NORVASC) 10 MG tablet TAKE ONE TABLET BY MOUTH DAILY 30 tablet 2  . aspirin 81 MG tablet Take 81 mg by mouth daily.    Marland Kitchen doxepin (SINEQUAN) 25 MG capsule Take 2 capsules (50 mg total) by mouth at bedtime. 60 capsule 3  . furosemide (LASIX) 20 MG tablet Take 1 tablet (20 mg total) by mouth daily. 30 tablet 1  . levothyroxine (SYNTHROID, LEVOTHROID) 50 MCG tablet Take 1 tablet (50 mcg total) by mouth daily before breakfast. 30 tablet 1  . omeprazole (PRILOSEC) 20 MG capsule TAKE 1 CAPSULE (20 MG TOTAL) BY MOUTH DAILY. 30 capsule 1  . PEGASYS 180 MCG/ML injection Inject 0.45 mLs (81 mcg total) into the skin every 14 (fourteen) days. 2 mL 3  . Potassium Chloride ER 20 MEQ TBCR Take 20 mEq by mouth 2 (two) times daily. 60 tablet 1   Current Facility-Administered Medications on File Prior to Visit  Medication Dose Route Frequency Provider Last Rate Last Dose  . acetaminophen (TYLENOL) tablet 650 mg  650 mg Oral Once [name], MD      . peginterferon alfa-2a (PEGASYS) injection 81 mcg  81 mcg Subcutaneous Weekly [name], MD   81 mcg at 04/26/14 1530     Objective:  Objective  Physical Exam  Constitutional: She is oriented to person, place, and time. She appears well-developed and well-nourished.  HENT:  Head: Normocephalic and atraumatic.  Eyes: Conjunctivae and EOM are normal.  Neck: Normal range of motion. Neck supple.  No JVD present. Carotid bruit is not present. No thyromegaly present.  Cardiovascular: Normal rate, regular rhythm and normal heart sounds.   No murmur heard. Pulmonary/Chest: Effort normal and breath sounds normal. No respiratory distress. She has no wheezes. She has no rales. She exhibits no tenderness.  Musculoskeletal: She exhibits no edema.  Neurological: She is alert and oriented to person, place, and time.  Psychiatric: She  has a normal mood and affect.  Nursing note and vitals reviewed.  BP 126/84 (BP Location: Right Arm, Patient Position: Sitting, Cuff Size: Normal)   Pulse 68   Temp 97.7 F (36.5 C) (Oral)   Ht 5\' 6"  (1.676 m)   Wt 229 lb 6.4 oz (104.1 kg)   SpO2 98%   BMI 37.03 kg/m  Wt Readings from Last 3 Encounters:  12/09/16 229 lb 6.4 oz (104.1 kg)  11/19/16 235 lb (106.6 kg)  09/23/16 240 lb (108.9 kg)     Lab Results  Component Value Date   WBC 17.2 (H) 11/19/2016   HGB 13.8 11/19/2016   HCT 41.7 11/19/2016   PLT 285 11/19/2016   GLUCOSE 86 12/09/2016   CHOL 186 12/09/2016   TRIG 182.0 (H) 12/09/2016   HDL 37.40 (L) 12/09/2016   LDLCALC 112 (H) 12/09/2016   ALT 9 12/09/2016   AST 15 12/09/2016   NA 135 12/09/2016   K 4.1 12/09/2016   CL 103 12/09/2016   CREATININE 0.60 12/09/2016   BUN 10 12/09/2016   CO2 24 12/09/2016   TSH 0.92 12/09/2016   INR 0.9 05/06/2016   MICROALBUR <0.7 04/23/2016    No results found.   Assessment & Plan:  Plan  I am having Ms. Queenan maintain her aspirin, amLODipine, doxepin, PEGASYS, Potassium Chloride ER, levothyroxine, furosemide, omeprazole, and KLOR-CON M20.  Meds ordered this encounter  Medications  . KLOR-CON M20 20 MEQ tablet    Sig: Take 20 mEq by mouth 2 (two) times daily.    Refill:  1    Problem List Items Addressed This Visit      Unprioritized   HTN (hypertension)    Well controlled, no changes to meds. Encouraged heart healthy diet such as the DASH diet and exercise as tolerated.        Relevant Medications   KLOR-CON M20 20 MEQ tablet   Hyperlipidemia    Encouraged heart healthy diet, increase exercise, avoid trans fats, consider a krill oil cap daily       Other Visit Diagnoses    Hair loss    -  Primary   Relevant Orders   Thyroid Panel With TSH (Completed)   Hyperlipidemia LDL goal <100       Relevant Orders   Lipid panel (Completed)   Comprehensive metabolic panel (Completed)      Follow-up:  Return in about 6 months (around 06/08/2017), or if symptoms worsen or fail to improve.  Ann Held, DO

## 2016-12-09 NOTE — Assessment & Plan Note (Signed)
Encouraged heart healthy diet, increase exercise, avoid trans fats, consider a krill oil cap daily 

## 2016-12-09 NOTE — Patient Instructions (Signed)

## 2016-12-10 LAB — COMPREHENSIVE METABOLIC PANEL
ALBUMIN: 4.4 g/dL (ref 3.5–5.2)
ALK PHOS: 97 U/L (ref 39–117)
ALT: 9 U/L (ref 0–35)
AST: 15 U/L (ref 0–37)
BUN: 10 mg/dL (ref 6–23)
CALCIUM: 10.8 mg/dL — AB (ref 8.4–10.5)
CHLORIDE: 103 meq/L (ref 96–112)
CO2: 24 mEq/L (ref 19–32)
CREATININE: 0.6 mg/dL (ref 0.40–1.20)
GFR: 113.07 mL/min (ref >60.00)
Glucose, Bld: 86 mg/dL (ref 70–99)
POTASSIUM: 4.1 meq/L (ref 3.5–5.1)
SODIUM: 135 meq/L (ref 135–145)
TOTAL PROTEIN: 7.8 g/dL (ref 6.0–8.3)
Total Bilirubin: 0.6 mg/dL (ref 0.2–1.2)

## 2016-12-10 LAB — LIPID PANEL
CHOL/HDL RATIO: 5
Cholesterol: 186 mg/dL (ref 0–200)
HDL: 37.4 mg/dL — ABNORMAL LOW (ref >39.00)
LDL Cholesterol: 112 mg/dL — ABNORMAL HIGH (ref 0–99)
NonHDL: 148.26
Triglycerides: 182 mg/dL — ABNORMAL HIGH (ref 0.0–149.0)
VLDL: 36.4 mg/dL (ref 0.0–40.0)

## 2016-12-29 ENCOUNTER — Other Ambulatory Visit (HOSPITAL_BASED_OUTPATIENT_CLINIC_OR_DEPARTMENT_OTHER): Payer: Self-pay

## 2016-12-29 ENCOUNTER — Ambulatory Visit (HOSPITAL_BASED_OUTPATIENT_CLINIC_OR_DEPARTMENT_OTHER): Payer: Self-pay | Admitting: Hematology & Oncology

## 2016-12-29 VITALS — BP 150/77 | HR 58 | Temp 98.0°F | Resp 19 | Wt 236.1 lb

## 2016-12-29 DIAGNOSIS — D471 Chronic myeloproliferative disease: Secondary | ICD-10-CM

## 2016-12-29 LAB — CBC WITH DIFFERENTIAL (CANCER CENTER ONLY)
BASO#: 0 10e3/uL (ref 0.0–0.2)
BASO%: 0.3 % (ref 0.0–2.0)
EOS%: 0.5 % (ref 0.0–7.0)
Eosinophils Absolute: 0.1 10e3/uL (ref 0.0–0.5)
HCT: 40.2 % (ref 34.8–46.6)
HGB: 13.2 g/dL (ref 11.6–15.9)
LYMPH#: 2.7 10e3/uL (ref 0.9–3.3)
LYMPH%: 17.9 % (ref 14.0–48.0)
MCH: 30.4 pg (ref 26.0–34.0)
MCHC: 32.8 g/dL (ref 32.0–36.0)
MCV: 93 fL (ref 81–101)
MONO#: 0.6 10e3/uL (ref 0.1–0.9)
MONO%: 4.1 % (ref 0.0–13.0)
NEUT#: 11.5 10e3/uL — ABNORMAL HIGH (ref 1.5–6.5)
NEUT%: 77.2 % (ref 39.6–80.0)
Platelets: 293 10e3/uL (ref 145–400)
RBC: 4.34 10e6/uL (ref 3.70–5.32)
RDW: 13.8 % (ref 11.1–15.7)
WBC: 15 10e3/uL — ABNORMAL HIGH (ref 3.9–10.0)

## 2016-12-29 LAB — CMP (CANCER CENTER ONLY)
ALT(SGPT): 12 U/L (ref 10–47)
AST: 16 U/L (ref 11–38)
Albumin: 3.5 g/dL (ref 3.3–5.5)
Alkaline Phosphatase: 113 U/L — ABNORMAL HIGH (ref 26–84)
BUN, Bld: 9 mg/dL (ref 7–22)
CO2: 26 meq/L (ref 18–33)
Calcium: 10.3 mg/dL (ref 8.0–10.3)
Chloride: 105 meq/L (ref 98–108)
Creat: 0.7 mg/dL (ref 0.6–1.2)
Glucose, Bld: 105 mg/dL (ref 73–118)
Potassium: 3.8 meq/L (ref 3.3–4.7)
Sodium: 141 meq/L (ref 128–145)
Total Bilirubin: 0.9 mg/dL (ref 0.20–1.60)
Total Protein: 7.4 g/dL (ref 6.4–8.1)

## 2016-12-29 LAB — LACTATE DEHYDROGENASE: LDH: 157 U/L (ref 125–245)

## 2016-12-29 LAB — CHCC SATELLITE - SMEAR

## 2016-12-29 NOTE — Progress Notes (Signed)
Hematology and Oncology Follow Up Visit  Brianna Fletcher [id number] Nov 04, 1970 46 y.o. 12/29/2016   Principle Diagnosis:  Chronic myeloproliferative syndrome-Triple negative Sub-clinical hypothyroidism (+) p-ANCA  Current Therapy:   Peg-Interferon q weekly - q 4 week dosing - restart on 10/16/2016. Synthroid 0.050 mg po q day     Interim History:  [name] is back for followup. Her problem now is that she is having back issues. Unfortunately, she cannot have anything done because she has no insurance. She is trying to work on getting some kind of insurance.  She, thankfully, has 1 more dose of the PEG-interferon. I'm going to have to write a letter to her disability attorney to try to make sure that the PEG-interferon continues. It is working for her. Without it, she would not have a good quality of life as she would really have complications from her chronic myeloproliferative disorder.  Currently, she has no pruritus. She has no problems with bowels or bladder. She just as severe lower back issues. We have done an MRI which shows degenerative changes in her lower back.  [name] had no cough. There's been no bleeding. [name] had no fever.  Currently, her performance status is ECOG 1.   Medications:  Current Outpatient Prescriptions:  .  amLODipine (NORVASC) 10 MG tablet, TAKE ONE TABLET BY MOUTH DAILY, Disp: 30 tablet, Rfl: 2 .  aspirin 81 MG tablet, Take 81 mg by mouth daily., Disp: , Rfl:  .  doxepin (SINEQUAN) 25 MG capsule, Take 2 capsules (50 mg total) by mouth at bedtime., Disp: 60 capsule, Rfl: 3 .  furosemide (LASIX) 20 MG tablet, Take 1 tablet (20 mg total) by mouth daily., Disp: 30 tablet, Rfl: 1 .  KLOR-CON M20 20 MEQ tablet, Take 20 mEq by mouth 2 (two) times daily., Disp: , Rfl: 1 .  levothyroxine (SYNTHROID, LEVOTHROID) 50 MCG tablet, Take 1 tablet (50 mcg total) by mouth daily before breakfast., Disp: 30 tablet, Rfl: 1 .  omeprazole (PRILOSEC) 20 MG capsule, TAKE 1  CAPSULE (20 MG TOTAL) BY MOUTH DAILY., Disp: 30 capsule, Rfl: 1 .  PEGASYS 180 MCG/ML injection, Inject 0.45 mLs (81 mcg total) into the skin every 14 (fourteen) days., Disp: 2 mL, Rfl: 3 .  Potassium Chloride ER 20 MEQ TBCR, Take 20 mEq by mouth 2 (two) times daily., Disp: 60 tablet, Rfl: 1 No current facility-administered medications for this visit.   Facility-Administered Medications Ordered in Other Visits:  .  acetaminophen (TYLENOL) tablet 650 mg, 650 mg, Oral, Once, Niomi Valent R, MD .  peginterferon alfa-2a (PEGASYS) injection 81 mcg, 81 mcg, Subcutaneous, Weekly, [name], MD, 81 mcg at 04/26/14 1530  Allergies:  Allergies  Allergen Reactions  . Amitiza [Lubiprostone]     Made her sick  . Lisinopril Cough  . Losartan Cough    Past Medical History, Surgical history, Social history, and Family History were reviewed and updated.  Review of Systems: As stated in the interim history  Physical Exam:  weight is 236 lb 1.9 oz (107.1 kg). Her oral temperature is 98 F (36.7 C). Her blood pressure is 150/77 (abnormal) and her pulse is 58 (abnormal). Her respiration is 19 and oxygen saturation is 100%.   Physical Exam  Constitutional: She is oriented to person, place, and time.  HENT:  Head: Normocephalic and atraumatic.  Mouth/Throat: Oropharynx is clear and moist.  Eyes: Pupils are equal, round, and reactive to light. EOM are normal.  Neck: Normal range of motion.  Cardiovascular: Normal rate, regular rhythm and normal heart sounds.   Pulmonary/Chest: Effort normal and breath sounds normal.  Abdominal: Soft. Bowel sounds are normal.  Musculoskeletal: Normal range of motion. She exhibits no edema, tenderness or deformity.  Lymphadenopathy:    She has no cervical adenopathy.  Neurological: She is alert and oriented to person, place, and time.  Skin: Skin is warm and dry. No rash noted. No erythema.  Psychiatric: She has a normal mood and affect. Her behavior is  normal. Judgment and thought content normal.  Vitals reviewed.    Lab Results  Component Value Date   WBC 15.0 (H) 12/29/2016   HGB 13.2 12/29/2016   HCT 40.2 12/29/2016   MCV 93 12/29/2016   PLT 293 12/29/2016     Chemistry      Component Value Date/Time   NA 141 12/29/2016 1319   NA 136 03/10/2016 1147   K 3.8 12/29/2016 1319   K 3.9 03/10/2016 1147   CL 105 12/29/2016 1319   CO2 26 12/29/2016 1319   CO2 24 03/10/2016 1147   BUN 9 12/29/2016 1319   BUN 10.2 03/10/2016 1147   CREATININE 0.7 12/29/2016 1319   CREATININE 0.7 03/10/2016 1147      Component Value Date/Time   CALCIUM 10.3 12/29/2016 1319   CALCIUM 10.7 (H) 03/10/2016 1147   ALKPHOS 113 (H) 12/29/2016 1319   ALKPHOS 132 03/10/2016 1147   AST 16 12/29/2016 1319   AST 12 03/10/2016 1147   ALT 12 12/29/2016 1319   ALT 8 03/10/2016 1147   BILITOT 0.90 12/29/2016 1319   BILITOT 0.63 03/10/2016 1147         Impression and Plan: Brianna Fletcher is 46 year old female. She has a myeloproliferative syndrome. So far, our workup has been totally negative.  I told her to take her last dose of PEG interferon. I will have the letter written to her attorney. Hopefully, we will be able to somehow get the interferon for her through the company.  I'll like to see her back right before Thanksgiving. If her blood counts looked okay, then maybe we might be able to hold off on interferon over the holidays. That might complicate her life less.   As always, I spent about 30 minutes with her today.    [name], MD 10/10/20182:04 PM

## 2016-12-30 ENCOUNTER — Encounter: Payer: Self-pay | Admitting: *Deleted

## 2017-01-27 ENCOUNTER — Other Ambulatory Visit: Payer: Self-pay | Admitting: Family Medicine

## 2017-01-27 DIAGNOSIS — E041 Nontoxic single thyroid nodule: Secondary | ICD-10-CM

## 2017-01-27 DIAGNOSIS — D471 Chronic myeloproliferative disease: Secondary | ICD-10-CM

## 2017-02-08 ENCOUNTER — Other Ambulatory Visit (HOSPITAL_BASED_OUTPATIENT_CLINIC_OR_DEPARTMENT_OTHER): Payer: Self-pay

## 2017-02-08 ENCOUNTER — Encounter: Payer: Self-pay | Admitting: Family

## 2017-02-08 ENCOUNTER — Other Ambulatory Visit: Payer: Self-pay | Admitting: Family Medicine

## 2017-02-08 ENCOUNTER — Other Ambulatory Visit: Payer: Self-pay

## 2017-02-08 ENCOUNTER — Ambulatory Visit (HOSPITAL_BASED_OUTPATIENT_CLINIC_OR_DEPARTMENT_OTHER): Payer: Self-pay | Admitting: Family

## 2017-02-08 VITALS — BP 141/64 | HR 59 | Temp 98.1°F | Resp 17 | Wt 232.4 lb

## 2017-02-08 DIAGNOSIS — D471 Chronic myeloproliferative disease: Secondary | ICD-10-CM

## 2017-02-08 DIAGNOSIS — B369 Superficial mycosis, unspecified: Secondary | ICD-10-CM

## 2017-02-08 DIAGNOSIS — R5383 Other fatigue: Secondary | ICD-10-CM

## 2017-02-08 DIAGNOSIS — L309 Dermatitis, unspecified: Secondary | ICD-10-CM

## 2017-02-08 DIAGNOSIS — E038 Other specified hypothyroidism: Secondary | ICD-10-CM

## 2017-02-08 DIAGNOSIS — R21 Rash and other nonspecific skin eruption: Secondary | ICD-10-CM

## 2017-02-08 DIAGNOSIS — M2550 Pain in unspecified joint: Secondary | ICD-10-CM

## 2017-02-08 LAB — CBC WITH DIFFERENTIAL (CANCER CENTER ONLY)
BASO#: 0 10*3/uL (ref 0.0–0.2)
BASO%: 0.1 % (ref 0.0–2.0)
EOS ABS: 0.1 10*3/uL (ref 0.0–0.5)
EOS%: 0.9 % (ref 0.0–7.0)
HCT: 42.6 % (ref 34.8–46.6)
HEMOGLOBIN: 14.1 g/dL (ref 11.6–15.9)
LYMPH#: 2.6 10*3/uL (ref 0.9–3.3)
LYMPH%: 17.9 % (ref 14.0–48.0)
MCH: 30.6 pg (ref 26.0–34.0)
MCHC: 33.1 g/dL (ref 32.0–36.0)
MCV: 92 fL (ref 81–101)
MONO#: 0.6 10*3/uL (ref 0.1–0.9)
MONO%: 4.1 % (ref 0.0–13.0)
NEUT#: 11 10*3/uL — ABNORMAL HIGH (ref 1.5–6.5)
NEUT%: 77 % (ref 39.6–80.0)
Platelets: 306 10*3/uL (ref 145–400)
RBC: 4.61 10*6/uL (ref 3.70–5.32)
RDW: 13.2 % (ref 11.1–15.7)
WBC: 14.2 10*3/uL — AB (ref 3.9–10.0)

## 2017-02-08 LAB — CMP (CANCER CENTER ONLY)
ALT(SGPT): 16 U/L (ref 10–47)
AST: 14 U/L (ref 11–38)
Albumin: 3.7 g/dL (ref 3.3–5.5)
Alkaline Phosphatase: 106 U/L — ABNORMAL HIGH (ref 26–84)
BUN: 10 mg/dL (ref 7–22)
CHLORIDE: 105 meq/L (ref 98–108)
CO2: 26 meq/L (ref 18–33)
CREATININE: 0.7 mg/dL (ref 0.6–1.2)
Calcium: 10.4 mg/dL — ABNORMAL HIGH (ref 8.0–10.3)
Glucose, Bld: 101 mg/dL (ref 73–118)
POTASSIUM: 3.4 meq/L (ref 3.3–4.7)
SODIUM: 140 meq/L (ref 128–145)
Total Bilirubin: 0.6 mg/dl (ref 0.20–1.60)
Total Protein: 7.9 g/dL (ref 6.4–8.1)

## 2017-02-08 LAB — LACTATE DEHYDROGENASE: LDH: 153 U/L (ref 125–245)

## 2017-02-08 LAB — CHCC SATELLITE - SMEAR

## 2017-02-08 NOTE — Telephone Encounter (Signed)
Pt is asking for refill on CLOTRIMAZOLE medication was discontinued once she completed the course.  Please advise

## 2017-02-08 NOTE — Progress Notes (Signed)
Hematology and Oncology Follow Up Visit  Brianna Fletcher [id number] [birth year] 46 y.o. 02/08/2017   Principle Diagnosis:  Chronic myeloproliferative syndrome-Triple negative Sub-clinical hypothyroidism (+) p-ANCA  Current Therapy:   Peg-Interferon q weekly - q 4 week dosing - on hold waiting for Medicaid to kick in Synthroid 0.050 mg po q day   Interim History:  Brianna Fletcher is here today for follow-up. She still does not have insurance and is waiting for her Medicaid appeal court date. She is hoping to have this sorted out by the new year.  Her last PEG interferon injection was 5-6 weeks ago.  She is still having generalized joint aches and pains and states that she feels that she may have psoriatic arthritis. She also has rashes and eczema like spots that will come up on her skin. She was seeing rheumatology but had to put the work up on hold due to her not having insurance.  She is still having fatigue as well. Her hair is thinning.  No fever, chills, n/v, cough, dizziness, SOB, chest pain, palpitations, abdominal pain or changes in bowel or bladder habits.  No episodes of bleeding, bruising or petechiae. No lymphadenopathy found on exam.  No swelling, numbness or tinging in her extremities at this time.  She has a good appetite and is staying hydrated. Her weight is stable.   ECOG Performance Status: 1 - Symptomatic but completely ambulatory  Medications:  Allergies as of 02/08/2017      Reactions   Amitiza [lubiprostone]    Made her sick   Lisinopril Cough   Losartan Cough      Medication List        Accurate as of 02/08/17  3:11 PM. Always use your most recent med list.          amLODipine 10 MG tablet Commonly known as:  NORVASC TAKE ONE TABLET BY MOUTH DAILY   aspirin 81 MG tablet Take 81 mg by mouth daily.   clotrimazole-betamethasone cream Commonly known as:  LOTRISONE APPLY 1 APPLICATION TOPICALLY 2 TIMES A DAY.   doxepin 25 MG capsule Commonly known  as:  SINEQUAN Take 2 capsules (50 mg total) by mouth at bedtime.   furosemide 20 MG tablet Commonly known as:  LASIX Take 1 tablet (20 mg total) by mouth daily.   KLOR-CON M20 20 MEQ tablet Generic drug:  potassium chloride SA Take 20 mEq by mouth 2 (two) times daily.   levothyroxine 50 MCG tablet Commonly known as:  SYNTHROID, LEVOTHROID Take 1 tablet (50 mcg total) daily before breakfast by mouth.   omeprazole 20 MG capsule Commonly known as:  PRILOSEC TAKE 1 CAPSULE (20 MG TOTAL) BY MOUTH DAILY.   PEGASYS 180 MCG/ML injection Generic drug:  peginterferon alfa-2a Inject 0.45 mLs (81 mcg total) into the skin every 14 (fourteen) days.   Potassium Chloride ER 20 MEQ Tbcr Take 20 mEq by mouth 2 (two) times daily.       Allergies:  Allergies  Allergen Reactions  . Amitiza [Lubiprostone]     Made her sick  . Lisinopril Cough  . Losartan Cough    Past Medical History, Surgical history, Social history, and Family History were reviewed and updated.  Review of Systems: All other 10 point review of systems is negative.   Physical Exam:  weight is 232 lb 6.4 oz (105.4 kg). Her oral temperature is 98.1 F (36.7 C). Her blood pressure is 141/64 (abnormal) and her pulse is 59 (abnormal). Her respiration is 17  and oxygen saturation is 96%.   Wt Readings from Last 3 Encounters:  02/08/17 232 lb 6.4 oz (105.4 kg)  12/29/16 236 lb 1.9 oz (107.1 kg)  12/09/16 229 lb 6.4 oz (104.1 kg)    Ocular: Sclerae unicteric, pupils equal, round and reactive to light Ear-nose-throat: Oropharynx clear, dentition fair Lymphatic: No cervical, supraclavicular or axillary adenopathy Lungs no rales or rhonchi, good excursion bilaterally Heart regular rate and rhythm, no murmur appreciated Abd soft, nontender, positive bowel sounds, no liver or spleen tip palpated on exam, no fluid wave  MSK no focal spinal tenderness, no joint edema Neuro: non-focal, well-oriented, appropriate affect Breasts:  Deferred   Lab Results  Component Value Date   WBC 14.2 (H) 02/08/2017   HGB 14.1 02/08/2017   HCT 42.6 02/08/2017   MCV 92 02/08/2017   PLT 306 02/08/2017   Lab Results  Component Value Date   FERRITIN 86 09/23/2016   IRON 83 09/23/2016   TIBC 293 09/23/2016   UIBC 209 09/23/2016   IRONPCTSAT 28 09/23/2016   Lab Results  Component Value Date   RETICCTPCT 5.3 (H) 11/20/2013   RBC 4.61 02/08/2017   RETICCTABS 116.1 11/20/2013   No results found for: KPAFRELGTCHN, LAMBDASER, KAPLAMBRATIO No results found for: IGGSERUM, IGA, IGMSERUM No results found for: Odetta Pink, SPEI   Chemistry      Component Value Date/Time   NA 140 02/08/2017 0925   NA 136 03/10/2016 1147   K 3.4 02/08/2017 0925   K 3.9 03/10/2016 1147   CL 105 02/08/2017 0925   CO2 26 02/08/2017 0925   CO2 24 03/10/2016 1147   BUN 10 02/08/2017 0925   BUN 10.2 03/10/2016 1147   CREATININE 0.7 02/08/2017 0925   CREATININE 0.7 03/10/2016 1147      Component Value Date/Time   CALCIUM 10.4 (H) 02/08/2017 0925   CALCIUM 10.7 (H) 03/10/2016 1147   ALKPHOS 106 (H) 02/08/2017 0925   ALKPHOS 132 03/10/2016 1147   AST 14 02/08/2017 0925   AST 12 03/10/2016 1147   ALT 16 02/08/2017 0925   ALT 8 03/10/2016 1147   BILITOT 0.60 02/08/2017 0925   BILITOT 0.63 03/10/2016 1147      Impression and Plan: Brianna Fletcher is a very pleasant 46 yo caucasian female with a myeloproliferative syndrome. She is currently off treatment due to her loss of insurance.  She is hoping to have her Medicaid by the start of the new year.  She is symptomatic with fatigue and generalized joint aches and pains.  We will plan to see her back again in another 2 months for follow-up and lab.  She will contact our office with any questions or concerns. We can certainly see her sooner if need be.   Eliezer Bottom, NP 11/20/20183:11 PM

## 2017-02-09 ENCOUNTER — Other Ambulatory Visit: Payer: Self-pay

## 2017-02-09 ENCOUNTER — Ambulatory Visit: Payer: Self-pay | Admitting: Family

## 2017-02-09 LAB — RETICULOCYTES: Reticulocyte Count: 2.1 % (ref 0.6–2.6)

## 2017-02-25 ENCOUNTER — Other Ambulatory Visit: Payer: Self-pay | Admitting: Family Medicine

## 2017-02-25 DIAGNOSIS — R600 Localized edema: Secondary | ICD-10-CM

## 2017-03-06 ENCOUNTER — Other Ambulatory Visit: Payer: Self-pay | Admitting: Family Medicine

## 2017-03-06 DIAGNOSIS — D471 Chronic myeloproliferative disease: Secondary | ICD-10-CM

## 2017-03-06 DIAGNOSIS — E876 Hypokalemia: Secondary | ICD-10-CM

## 2017-04-05 ENCOUNTER — Telehealth: Payer: Self-pay

## 2017-04-05 NOTE — Telephone Encounter (Signed)
Left message for patient to call me back to discuss Interferon injections, as we are unable to give them here at our facility.  Explained this is a home administrable medication, and glad to discuss further.

## 2017-04-12 ENCOUNTER — Inpatient Hospital Stay: Payer: Self-pay

## 2017-04-12 ENCOUNTER — Ambulatory Visit: Payer: Medicaid Other

## 2017-04-12 ENCOUNTER — Inpatient Hospital Stay: Payer: Self-pay | Attending: Hematology & Oncology | Admitting: Hematology & Oncology

## 2017-04-12 VITALS — BP 123/67 | HR 60 | Temp 97.5°F | Resp 16 | Wt 225.0 lb

## 2017-04-12 DIAGNOSIS — D471 Chronic myeloproliferative disease: Secondary | ICD-10-CM

## 2017-04-12 LAB — CBC WITH DIFFERENTIAL (CANCER CENTER ONLY)
BASOS ABS: 0 10*3/uL (ref 0.0–0.1)
Basophils Relative: 0 %
EOS ABS: 0.1 10*3/uL (ref 0.0–0.5)
Eosinophils Relative: 1 %
HCT: 43.1 % (ref 34.8–46.6)
HEMOGLOBIN: 14.1 g/dL (ref 11.6–15.9)
LYMPHS ABS: 3 10*3/uL (ref 0.9–3.3)
LYMPHS PCT: 20 %
MCH: 30.2 pg (ref 26.0–34.0)
MCHC: 32.7 g/dL (ref 32.0–36.0)
MCV: 92.3 fL (ref 81.0–101.0)
Monocytes Absolute: 0.8 10*3/uL (ref 0.1–0.9)
Monocytes Relative: 5 %
NEUTROS PCT: 74 %
Neutro Abs: 11.4 10*3/uL — ABNORMAL HIGH (ref 1.5–6.5)
PLATELETS: 313 10*3/uL (ref 145–400)
RBC: 4.67 MIL/uL (ref 3.70–5.32)
RDW: 12.8 % (ref 11.1–15.7)
WBC: 15.4 10*3/uL — AB (ref 3.9–10.3)

## 2017-04-12 LAB — LACTATE DEHYDROGENASE: LDH: 141 U/L (ref 125–245)

## 2017-04-12 LAB — RETICULOCYTES
RBC.: 4.64 MIL/uL (ref 3.70–5.45)
Retic Count, Absolute: 97.4 10*3/uL — ABNORMAL HIGH (ref 33.7–90.7)
Retic Ct Pct: 2.1 % (ref 0.7–2.1)

## 2017-04-12 LAB — CMP (CANCER CENTER ONLY)
ALK PHOS: 114 U/L — AB (ref 26–84)
ALT: 13 U/L (ref 0–55)
AST: 15 U/L (ref 5–34)
Albumin: 4.1 g/dL (ref 3.5–5.0)
Anion gap: 5 (ref 5–15)
BUN: 9 mg/dL (ref 7–22)
CALCIUM: 11.1 mg/dL — AB (ref 8.0–10.3)
CO2: 29 mmol/L (ref 18–33)
Chloride: 106 mmol/L (ref 98–108)
Creatinine: 0.7 mg/dL (ref 0.60–1.10)
GLUCOSE: 100 mg/dL (ref 73–118)
Potassium: 3.6 mmol/L (ref 3.5–5.1)
SODIUM: 140 mmol/L (ref 128–145)
Total Bilirubin: 1 mg/dL (ref 0.2–1.2)
Total Protein: 8.2 g/dL — ABNORMAL HIGH (ref 6.4–8.1)

## 2017-04-12 LAB — SAVE SMEAR

## 2017-04-12 NOTE — Progress Notes (Signed)
Hematology and Oncology Follow Up Visit  Brianna Fletcher [id number] 01-14-1971 47 y.o. 04/12/2017   Principle Diagnosis:  Chronic myeloproliferative syndrome-Triple negative Sub-clinical hypothyroidism (+) p-ANCA  Current Therapy:   Peg-Interferon q weekly - q 4 week dosing - restart on 10/16/2016. Synthroid 0.050 mg po q day     Interim History:  [name] is back for followup.  She is quite emotional today.  She had a birthday a couple weeks ago.  This was a tough day for her.  She still does not have insurance.  She has been denied Medicaid.  Because of this, she is not able to have her back operated on.  This is where her problems lie.  She has a terrible degenerative lumbar spine.  She needs surgery or at least injections to try to help with her pain and help with her being functional.  I talked her about all of this.  I offered her some anti-depressive agents.  She wants to avoid this right now.  I certainly understand this.  She has had [place] no problems with her myeloproliferative issue.  We have not had her on interferon for at least for 5 months.  She has had no rashes.  She has had no cough.  There is been no change in bowel or bladder habits.  She has had no fever.  There is been no bleeding.  Currently, her performance status is ECOG 1.   Medications:  Current Outpatient Medications:  .  amLODipine (NORVASC) 10 MG tablet, TAKE ONE TABLET BY MOUTH DAILY, Disp: 30 tablet, Rfl: 2 .  aspirin 81 MG tablet, Take 81 mg by mouth daily., Disp: , Rfl:  .  clotrimazole-betamethasone (LOTRISONE) cream, APPLY 1 APPLICATION TOPICALLY 2 TIMES A DAY., Disp: 30 g, Rfl: 0 .  doxepin (SINEQUAN) 25 MG capsule, Take 2 capsules (50 mg total) by mouth at bedtime., Disp: 60 capsule, Rfl: 3 .  furosemide (LASIX) 20 MG tablet, TAKE 1 TABLET BY MOUTH EVERY DAY, Disp: 30 tablet, Rfl: 1 .  KLOR-CON M20 20 MEQ tablet, Take 20 mEq by mouth 2 (two) times daily., Disp: , Rfl: 1 .  levothyroxine  (SYNTHROID, LEVOTHROID) 50 MCG tablet, Take 1 tablet (50 mcg total) daily before breakfast by mouth., Disp: 30 tablet, Rfl: 5 .  omeprazole (PRILOSEC) 20 MG capsule, TAKE 1 CAPSULE (20 MG TOTAL) BY MOUTH DAILY., Disp: 30 capsule, Rfl: 1 .  PEGASYS 180 MCG/ML injection, Inject 0.45 mLs (81 mcg total) into the skin every 14 (fourteen) days., Disp: 2 mL, Rfl: 3 No current facility-administered medications for this visit.   Facility-Administered Medications Ordered in Other Visits:  .  acetaminophen (TYLENOL) tablet 650 mg, 650 mg, Oral, Once, Daryle Boyington R, MD .  peginterferon alfa-2a (PEGASYS) injection 81 mcg, 81 mcg, Subcutaneous, Weekly, [name], MD, 81 mcg at 04/26/14 1530  Allergies:  Allergies  Allergen Reactions  . Amitiza [Lubiprostone]     Made her sick  . Lisinopril Cough  . Losartan Cough    Past Medical History, Surgical history, Social history, and Family History were reviewed and updated.  Review of Systems: Review of Systems  Constitutional: Positive for malaise/fatigue.  HENT: Positive for congestion.   Eyes: Negative.   Respiratory: Positive for wheezing.   Cardiovascular: Positive for palpitations and leg swelling.  Gastrointestinal: Positive for constipation and nausea.  Genitourinary: Positive for urgency.  Musculoskeletal: Positive for back pain, joint pain and neck pain.  Skin: Positive for itching.  Neurological: Positive  for dizziness and headaches.  Endo/Heme/Allergies: Negative.   Psychiatric/Behavioral: The patient is nervous/anxious.     Physical Exam:  weight is 225 lb (102.1 kg). Her oral temperature is 97.5 F (36.4 C) (abnormal). Her blood pressure is 123/67 and her pulse is 60. Her respiration is 16 and oxygen saturation is 100%.   Physical Exam  Constitutional: She is oriented to person, place, and time.  HENT:  Head: Normocephalic and atraumatic.  Mouth/Throat: Oropharynx is clear and moist.  Eyes: EOM are normal. Pupils are  equal, round, and reactive to light.  Neck: Normal range of motion.  Cardiovascular: Normal rate, regular rhythm and normal heart sounds.  Pulmonary/Chest: Effort normal and breath sounds normal.  Abdominal: Soft. Bowel sounds are normal.  Musculoskeletal: Normal range of motion. She exhibits no edema, tenderness or deformity.  Lymphadenopathy:    She has no cervical adenopathy.  Neurological: She is alert and oriented to person, place, and time.  Skin: Skin is warm and dry. No rash noted. No erythema.  Psychiatric: She has a normal mood and affect. Her behavior is normal. Judgment and thought content normal.  Vitals reviewed.    Lab Results  Component Value Date   WBC 15.4 (H) 04/12/2017   HGB 14.1 02/08/2017   HCT 43.1 04/12/2017   MCV 92.3 04/12/2017   PLT 313 04/12/2017     Chemistry      Component Value Date/Time   NA 140 04/12/2017 0906   NA 140 02/08/2017 0925   NA 136 03/10/2016 1147   K 3.6 04/12/2017 0906   K 3.4 02/08/2017 0925   K 3.9 03/10/2016 1147   CL 106 04/12/2017 0906   CL 105 02/08/2017 0925   CO2 29 04/12/2017 0906   CO2 26 02/08/2017 0925   CO2 24 03/10/2016 1147   BUN 9 04/12/2017 0906   BUN 10 02/08/2017 0925   BUN 10.2 03/10/2016 1147   CREATININE 0.7 02/08/2017 0925   CREATININE 0.7 03/10/2016 1147      Component Value Date/Time   CALCIUM 11.1 (H) 04/12/2017 0906   CALCIUM 10.4 (H) 02/08/2017 0925   CALCIUM 10.7 (H) 03/10/2016 1147   ALKPHOS 114 (H) 04/12/2017 0906   ALKPHOS 106 (H) 02/08/2017 0925   ALKPHOS 132 03/10/2016 1147   AST 15 04/12/2017 0906   AST 12 03/10/2016 1147   ALT 13 04/12/2017 0906   ALT 16 02/08/2017 0925   ALT 8 03/10/2016 1147   BILITOT 1.0 04/12/2017 0906   BILITOT 0.63 03/10/2016 1147         Impression and Plan: Brianna Fletcher is 47 year old female. She has a myeloproliferative syndrome. So far, our workup has been totally negative.  We will continue to follow her off interferon.  I looked at her  blood under the microscope.  She had good maturity of her white blood cells.  I saw no immature myeloid cells.  She had no nucleated red blood cells.  We will plan to see her back in another 6 weeks.  Hopefully, she will be able to get some insurance or at least Medicaid so that she can have some intervention for her back.  Marland Kitchen [name], MD 1/22/20199:56 AM

## 2017-05-11 ENCOUNTER — Other Ambulatory Visit: Payer: Self-pay | Admitting: Family Medicine

## 2017-05-11 DIAGNOSIS — I10 Essential (primary) hypertension: Secondary | ICD-10-CM

## 2017-05-23 ENCOUNTER — Other Ambulatory Visit: Payer: Self-pay | Admitting: Family Medicine

## 2017-05-23 DIAGNOSIS — R600 Localized edema: Secondary | ICD-10-CM

## 2017-05-24 ENCOUNTER — Other Ambulatory Visit: Payer: Self-pay

## 2017-05-24 ENCOUNTER — Inpatient Hospital Stay: Payer: Medicaid Other

## 2017-05-24 ENCOUNTER — Inpatient Hospital Stay: Payer: Self-pay | Attending: Hematology & Oncology | Admitting: Hematology & Oncology

## 2017-05-24 VITALS — BP 142/70 | HR 68 | Temp 97.5°F | Resp 20 | Wt 227.0 lb

## 2017-05-24 DIAGNOSIS — M7989 Other specified soft tissue disorders: Secondary | ICD-10-CM

## 2017-05-24 DIAGNOSIS — D471 Chronic myeloproliferative disease: Secondary | ICD-10-CM

## 2017-05-24 DIAGNOSIS — M79672 Pain in left foot: Secondary | ICD-10-CM

## 2017-05-24 DIAGNOSIS — N951 Menopausal and female climacteric states: Secondary | ICD-10-CM

## 2017-05-24 DIAGNOSIS — M542 Cervicalgia: Secondary | ICD-10-CM

## 2017-05-24 DIAGNOSIS — R3915 Urgency of urination: Secondary | ICD-10-CM

## 2017-05-24 DIAGNOSIS — M549 Dorsalgia, unspecified: Secondary | ICD-10-CM

## 2017-05-24 DIAGNOSIS — R5383 Other fatigue: Secondary | ICD-10-CM

## 2017-05-24 DIAGNOSIS — R51 Headache: Secondary | ICD-10-CM

## 2017-05-24 DIAGNOSIS — R002 Palpitations: Secondary | ICD-10-CM

## 2017-05-24 DIAGNOSIS — E213 Hyperparathyroidism, unspecified: Secondary | ICD-10-CM

## 2017-05-24 DIAGNOSIS — E032 Hypothyroidism due to medicaments and other exogenous substances: Secondary | ICD-10-CM

## 2017-05-24 DIAGNOSIS — R062 Wheezing: Secondary | ICD-10-CM

## 2017-05-24 DIAGNOSIS — E039 Hypothyroidism, unspecified: Secondary | ICD-10-CM

## 2017-05-24 DIAGNOSIS — R42 Dizziness and giddiness: Secondary | ICD-10-CM

## 2017-05-24 DIAGNOSIS — R450 Nervousness: Secondary | ICD-10-CM

## 2017-05-24 DIAGNOSIS — M79671 Pain in right foot: Secondary | ICD-10-CM

## 2017-05-24 LAB — CBC WITH DIFFERENTIAL (CANCER CENTER ONLY)
Basophils Absolute: 0.1 10*3/uL (ref 0.0–0.1)
Basophils Relative: 0 %
EOS ABS: 0.2 10*3/uL (ref 0.0–0.5)
EOS PCT: 1 %
HCT: 42.1 % (ref 34.8–46.6)
Hemoglobin: 13.8 g/dL (ref 11.6–15.9)
LYMPHS ABS: 2.7 10*3/uL (ref 0.9–3.3)
LYMPHS PCT: 18 %
MCH: 30.8 pg (ref 26.0–34.0)
MCHC: 32.8 g/dL (ref 32.0–36.0)
MCV: 94 fL (ref 81.0–101.0)
MONO ABS: 0.8 10*3/uL (ref 0.1–0.9)
MONOS PCT: 5 %
Neutro Abs: 11.3 10*3/uL — ABNORMAL HIGH (ref 1.5–6.5)
Neutrophils Relative %: 76 %
PLATELETS: 294 10*3/uL (ref 145–400)
RBC: 4.48 MIL/uL (ref 3.70–5.32)
RDW: 12.9 % (ref 11.1–15.7)
WBC Count: 15.1 10*3/uL — ABNORMAL HIGH (ref 3.9–10.0)

## 2017-05-24 LAB — CMP (CANCER CENTER ONLY)
ALBUMIN: 3.7 g/dL (ref 3.5–5.0)
ALK PHOS: 118 U/L — AB (ref 26–84)
ALT: 13 U/L (ref 10–47)
AST: 16 U/L (ref 11–38)
Anion gap: 8 (ref 5–15)
BUN: 12 mg/dL (ref 7–22)
CHLORIDE: 108 mmol/L (ref 98–108)
CO2: 27 mmol/L (ref 18–33)
CREATININE: 0.9 mg/dL (ref 0.60–1.20)
Calcium: 10.9 mg/dL — ABNORMAL HIGH (ref 8.0–10.3)
GLUCOSE: 90 mg/dL (ref 73–118)
POTASSIUM: 3.1 mmol/L — AB (ref 3.3–4.7)
SODIUM: 143 mmol/L (ref 128–145)
Total Bilirubin: 0.9 mg/dL (ref 0.2–1.6)
Total Protein: 8.1 g/dL (ref 6.4–8.1)

## 2017-05-24 LAB — LACTATE DEHYDROGENASE: LDH: 156 U/L (ref 125–245)

## 2017-05-24 NOTE — Progress Notes (Signed)
Hematology and Oncology Follow Up Visit  Brianna Fletcher [id number] 1970-08-13 47 y.o. 05/24/2017   Principle Diagnosis:  Chronic myeloproliferative syndrome-Triple negative Sub-clinical hypothyroidism (+) p-ANCA  Current Therapy:   Peg-Interferon q weekly - q 4 week dosing - restart on 10/16/2016. Synthroid 0.050 mg po q day     Interim History:  [name] is back for followup.  She is quite emotional today.  A lot is going on in her life.  Unfortunately, she really has no other doctor that she can see.  She does not have insurance.  And because of this, she cannot see other doctors.  She has a myriad of complaints.  I think a lot of them have to do with her might be going through the change of life.  We will have to check her estradiol level, and FSH and LH levels.  If they suggest menopausal state, then she might need some intervention for that.  The good news is that she will be a grandmother.  Apparently, a son, who is not married, and a lady, in [place] will be having a child.  The son apparently is not happy about this.  [name] is also been complaining of pain in her feet.  She is been having some bowel issues.  She is been having some abits.  She has had no fever.  There is been no bleeding.  Currently, her performance status is ECOG 1.   Medications:  Current Outpatient Medications:  .  amLODipine (NORVASC) 10 MG tablet, TAKE 1 TABLET BY MOUTH EVERY DAY, Disp: 90 tablet, Rfl: 0 .  aspirin 81 MG tablet, Take 81 mg by mouth daily., Disp: , Rfl:  .  clotrimazole-betamethasone (LOTRISONE) cream, APPLY 1 APPLICATION TOPICALLY 2 TIMES A DAY., Disp: 30 g, Rfl: 0 .  doxepin (SINEQUAN) 25 MG capsule, Take 2 capsules (50 mg total) by mouth at bedtime., Disp: 60 capsule, Rfl: 3 .  furosemide (LASIX) 20 MG tablet, TAKE 1 TABLET BY MOUTH EVERY DAY, Disp: 30 tablet, Rfl: 1 .  KLOR-CON M20 20 MEQ tablet, Take 20 mEq by mouth 2 (two) times daily., Disp: , Rfl: 1 .   levothyroxine (SYNTHROID, LEVOTHROID) 50 MCG tablet, Take 1 tablet (50 mcg total) daily before breakfast by mouth., Disp: 30 tablet, Rfl: 5 .  omeprazole (PRILOSEC) 20 MG capsule, TAKE 1 CAPSULE (20 MG TOTAL) BY MOUTH DAILY., Disp: 30 capsule, Rfl: 1 .  PEGASYS 180 MCG/ML injection, Inject 0.45 mLs (81 mcg total) into the skin every 14 (fourteen) days., Disp: 2 mL, Rfl: 3 No current facility-administered medications for this visit.   Facility-Administered Medications Ordered in Other Visits:  .  acetaminophen (TYLENOL) tablet 650 mg, 650 mg, Oral, Once, Anyela Napierkowski R, MD .  peginterferon alfa-2a (PEGASYS) injection 81 mcg, 81 mcg, Subcutaneous, Weekly, [name], MD, 81 mcg at 04/26/14 1530  Allergies:  Allergies  Allergen Reactions  . Amitiza [Lubiprostone]     Made her sick  . Lisinopril Cough  . Losartan Cough    Past Medical History, Surgical history, Social history, and Family History were reviewed and updated.  Review of Systems: Review of Systems  Constitutional: Positive for malaise/fatigue.  HENT: Positive for congestion.   Eyes: Negative.   Respiratory: Positive for wheezing.   Cardiovascular: Positive for palpitations and leg swelling.  Gastrointestinal: Positive for constipation and nausea.  Genitourinary: Positive for urgency.  Musculoskeletal: Positive for back pain, joint pain and neck pain.  Skin: Positive for itching.  Neurological: Positive for dizziness and headaches.  Endo/Heme/Allergies: Negative.   Psychiatric/Behavioral: The patient is nervous/anxious.     Physical Exam:  weight is 227 lb (103 kg). Her oral temperature is 97.5 F (36.4 C) (abnormal). Her blood pressure is 142/70 (abnormal) and her pulse is 68. Her respiration is 20 and oxygen saturation is 100%.   Physical Exam  Constitutional: She is oriented to person, place, and time.  HENT:  Head: Normocephalic and atraumatic.  Mouth/Throat: Oropharynx is clear and moist.  Eyes: EOM  are normal. Pupils are equal, round, and reactive to light.  Neck: Normal range of motion.  Cardiovascular: Normal rate, regular rhythm and normal heart sounds.  Pulmonary/Chest: Effort normal and breath sounds normal.  Abdominal: Soft. Bowel sounds are normal.  Musculoskeletal: Normal range of motion. She exhibits no edema, tenderness or deformity.  Lymphadenopathy:    She has no cervical adenopathy.  Neurological: She is alert and oriented to person, place, and time.  Skin: Skin is warm and dry. No rash noted. No erythema.  Psychiatric: She has a normal mood and affect. Her behavior is normal. Judgment and thought content normal.  Vitals reviewed.    Lab Results  Component Value Date   WBC 15.1 (H) 05/24/2017   HGB 14.1 02/08/2017   HCT 42.1 05/24/2017   MCV 94.0 05/24/2017   PLT 294 05/24/2017     Chemistry      Component Value Date/Time   NA 143 05/24/2017 0944   NA 140 02/08/2017 0925   NA 136 03/10/2016 1147   K 3.1 (L) 05/24/2017 0944   K 3.4 02/08/2017 0925   K 3.9 03/10/2016 1147   CL 108 05/24/2017 0944   CL 105 02/08/2017 0925   CO2 27 05/24/2017 0944   CO2 26 02/08/2017 0925   CO2 24 03/10/2016 1147   BUN 12 05/24/2017 0944   BUN 10 02/08/2017 0925   BUN 10.2 03/10/2016 1147   CREATININE 0.90 05/24/2017 0944   CREATININE 0.7 02/08/2017 0925   CREATININE 0.7 03/10/2016 1147      Component Value Date/Time   CALCIUM 10.9 (H) 05/24/2017 0944   CALCIUM 10.4 (H) 02/08/2017 0925   CALCIUM 10.7 (H) 03/10/2016 1147   ALKPHOS 118 (H) 05/24/2017 0944   ALKPHOS 106 (H) 02/08/2017 0925   ALKPHOS 132 03/10/2016 1147   AST 16 05/24/2017 0944   AST 12 03/10/2016 1147   ALT 13 05/24/2017 0944   ALT 16 02/08/2017 0925   ALT 8 03/10/2016 1147   BILITOT 0.9 05/24/2017 0944   BILITOT 0.63 03/10/2016 1147         Impression and Plan: Brianna Fletcher is 47 year old female. She has a myeloproliferative syndrome. So far, our workup has been totally negative.  We will  continue to follow her off interferon.  She has been off interferon since October.  I looked at her blood under the microscope.  She had good maturity of her white blood cells.  I saw no immature myeloid cells.  She had no nucleated red blood cells.  We will plan to see her back in another 6 weeks.    Hopefully, she will be able to get some insurance or at least Medicaid so that she can have some intervention for her back.  Marland Kitchen [name], MD 3/5/201911:35 AM

## 2017-05-25 ENCOUNTER — Encounter: Payer: Self-pay | Admitting: *Deleted

## 2017-05-25 LAB — TSH: TSH: 0.934 u[IU]/mL (ref 0.308–3.960)

## 2017-05-25 LAB — FOLLICLE STIMULATING HORMONE: FSH: 61.5 m[IU]/mL

## 2017-05-25 LAB — TECHNOLOGIST SMEAR REVIEW

## 2017-05-25 LAB — LUTEINIZING HORMONE: LH: 32.5 m[IU]/mL

## 2017-05-26 LAB — ESTRADIOL, ULTRA SENS: Estradiol, Sensitive: 3.3 pg/mL

## 2017-05-26 IMAGING — US US EXTREM LOW VENOUS*L*
1 series · 13 of 24 positions shown · non-contrast
Comparison: None.

CLINICAL DATA: 46-year-old female with left calf pain and swelling



[Series 1: us extrem low venous*left* · 0.08mm/px · 13 of 27 slices shown]
[im 1/27]
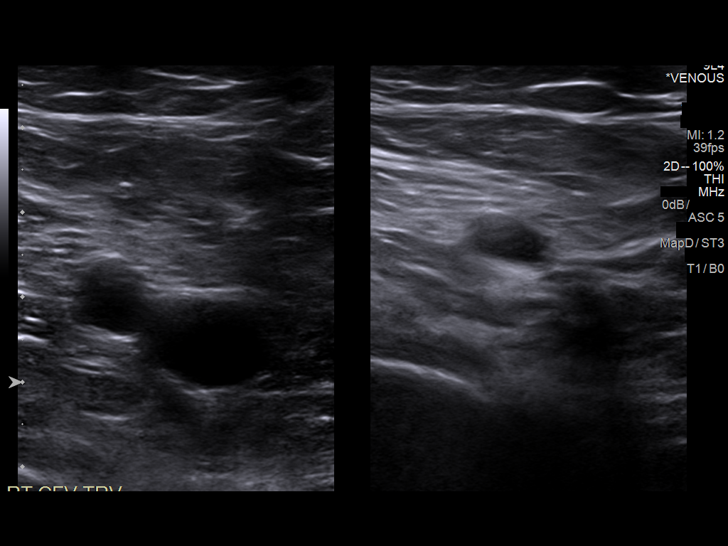
[im 3/27]
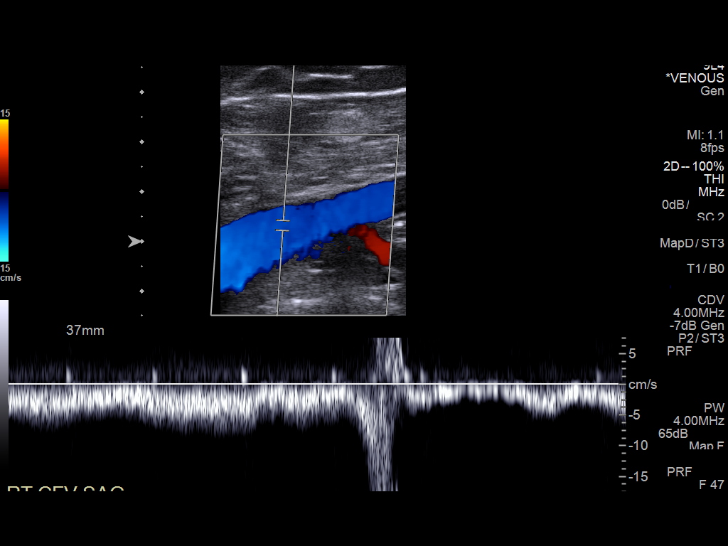
[im 5/27]
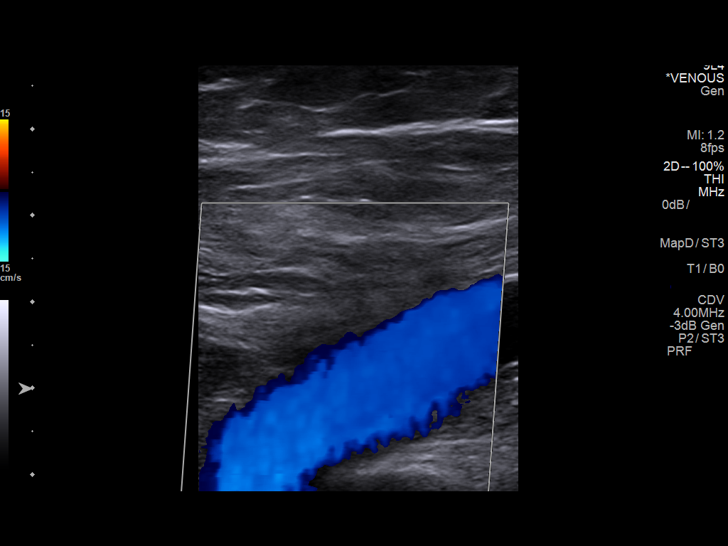
[im 7/27]
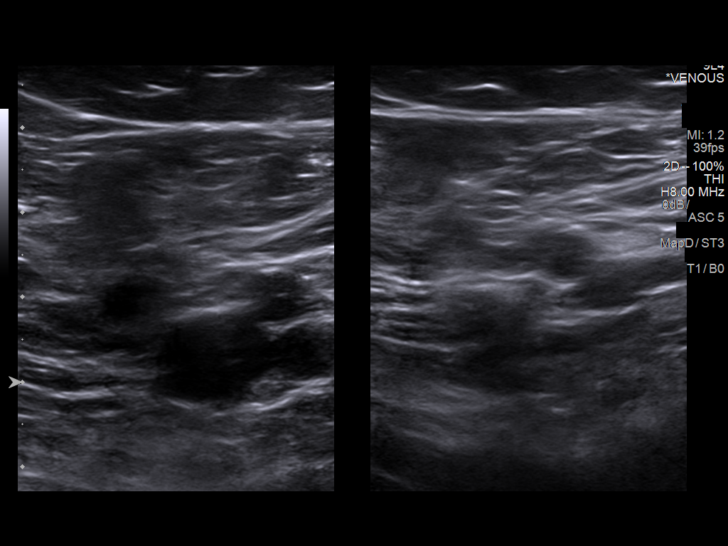
[im 10/27]
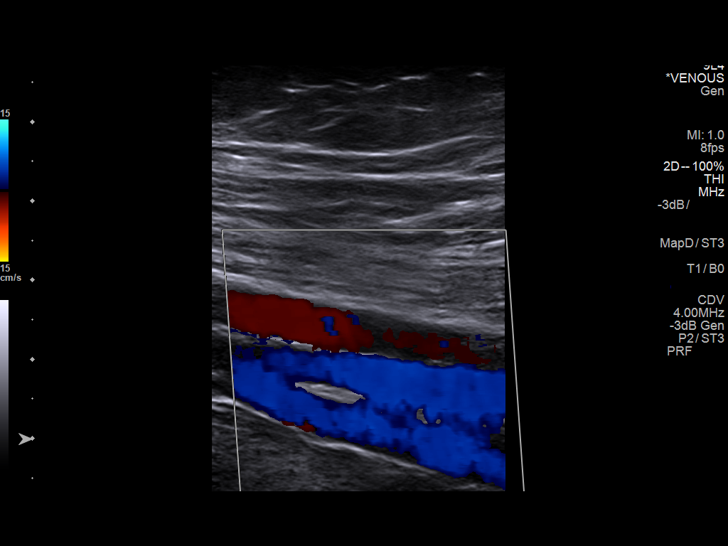
[im 12/27]
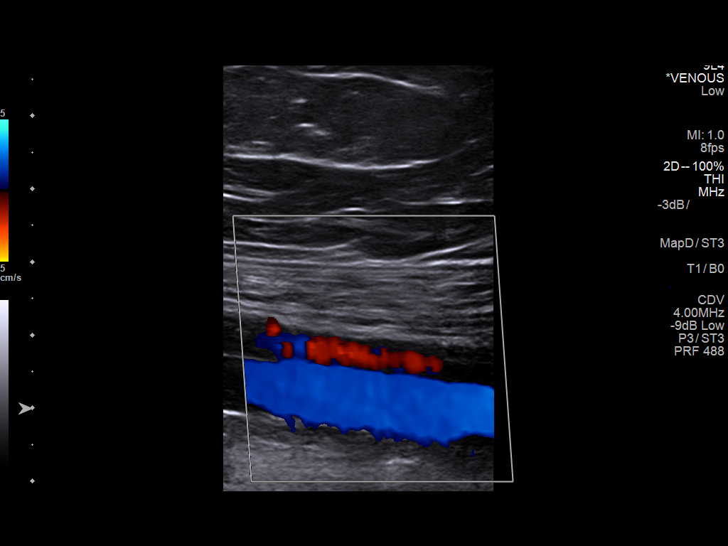
[im 14/27]
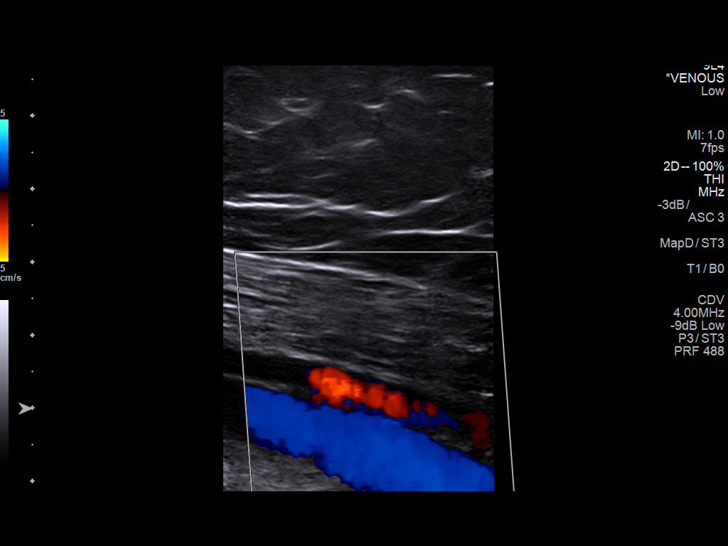
[im 15/27]
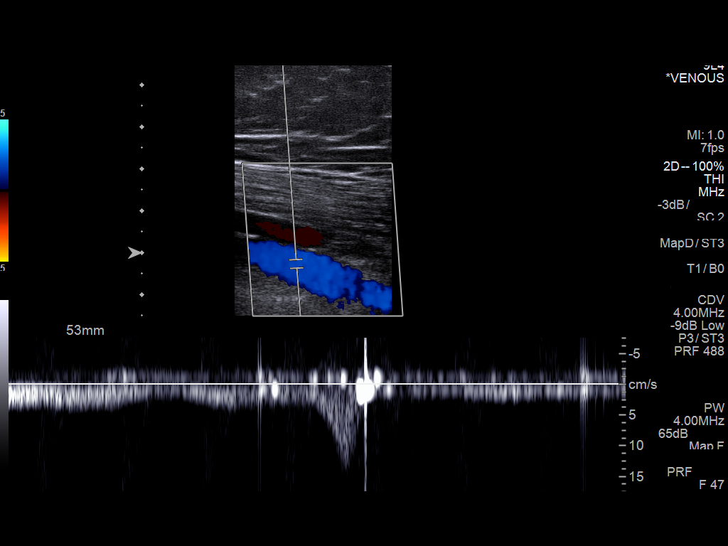
[im 17/27]
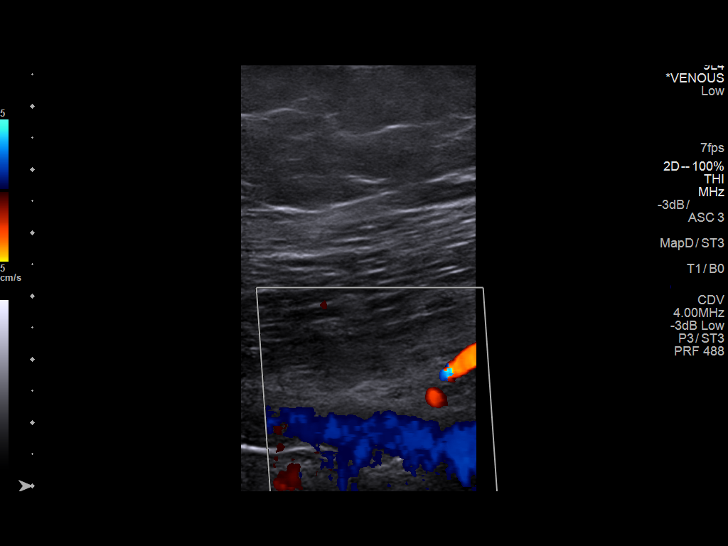
[im 20/27]
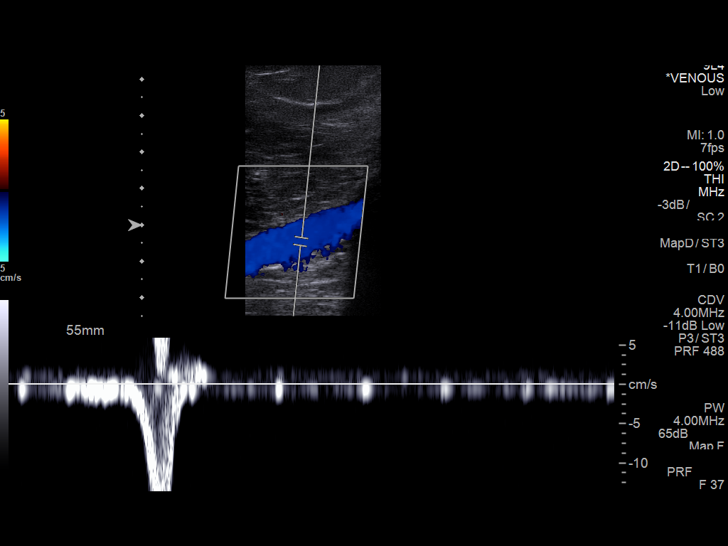
[im 22/27]
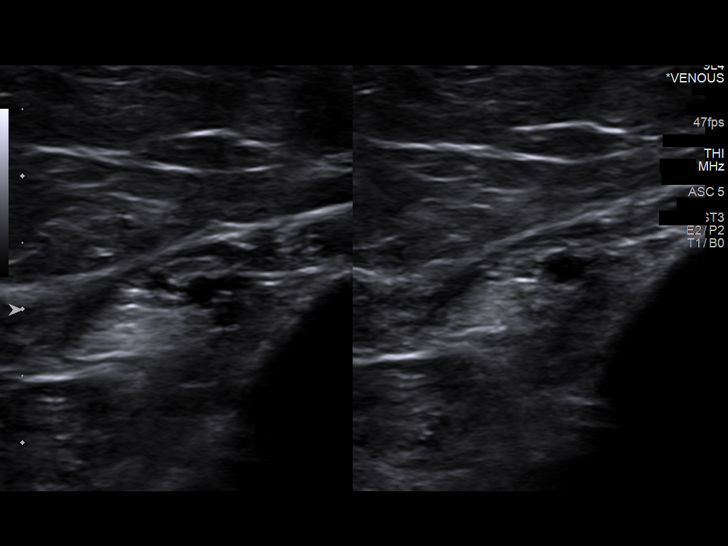
[im 24/27]
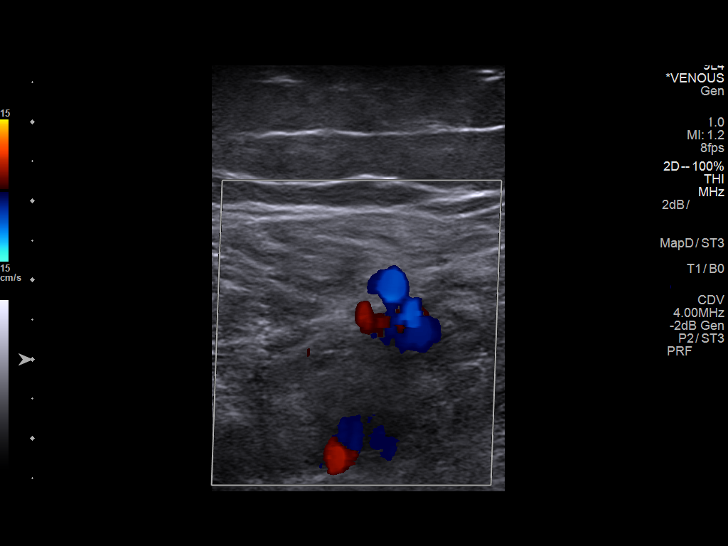
[im 27/27]
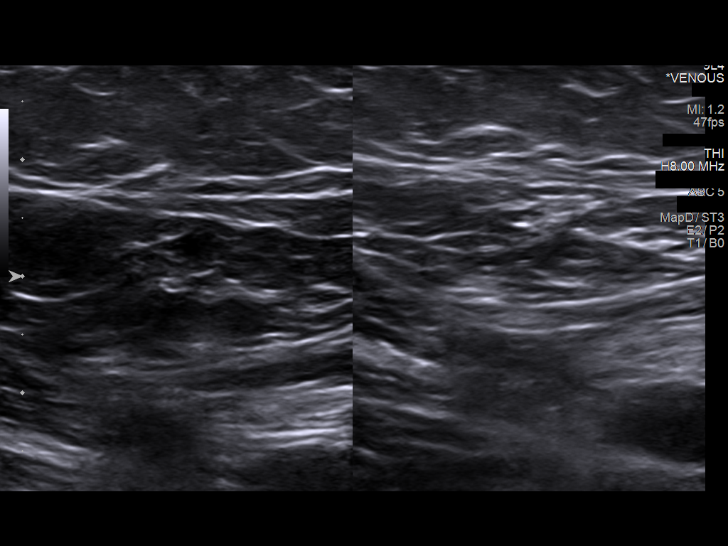

[13 of 24 positions shown; findings below may reference images not displayed]

FINDINGS: Contralateral Common Femoral Vein: Respiratory phasicity is normal
and symmetric with the symptomatic side. No evidence of thrombus.
Normal compressibility.

Common Femoral Vein: No evidence of thrombus. Normal
compressibility, respiratory phasicity and response to augmentation.

Saphenofemoral Junction: No evidence of thrombus. Normal
compressibility and flow on color Doppler imaging.

Profunda Femoral Vein: No evidence of thrombus. Normal
compressibility and flow on color Doppler imaging.

Femoral Vein: No evidence of thrombus. Normal compressibility,
respiratory phasicity and response to augmentation.

Popliteal Vein: No evidence of thrombus. Normal compressibility,
respiratory phasicity and response to augmentation.

Calf Veins: No evidence of thrombus. Normal compressibility and flow
on color Doppler imaging.

Superficial Great Saphenous Vein: No evidence of thrombus. Normal
compressibility and flow on color Doppler imaging.

Other Findings:  None.
IMPRESSION: Sonographic survey left lower extremity negative for DVT.

## 2017-05-26 MED ORDER — OXYBUTYNIN CHLORIDE ER 10 MG PO TB24: 10.0000 mg | ORAL_TABLET | Freq: Every day | ORAL | 3 refills | Status: DC

## 2017-05-26 NOTE — Addendum Note (Signed)
Addended by: Burney Gauze R on: 05/26/2017 01:33 PM   Modules accepted: Orders

## 2017-06-09 ENCOUNTER — Ambulatory Visit: Payer: Self-pay | Admitting: Family Medicine

## 2017-07-05 ENCOUNTER — Inpatient Hospital Stay: Payer: Self-pay | Attending: Hematology & Oncology | Admitting: Family

## 2017-07-05 ENCOUNTER — Inpatient Hospital Stay: Payer: Self-pay

## 2017-07-05 ENCOUNTER — Encounter: Payer: Self-pay | Admitting: Family

## 2017-07-05 ENCOUNTER — Other Ambulatory Visit: Payer: Self-pay

## 2017-07-05 VITALS — BP 130/82 | HR 60 | Temp 97.7°F | Resp 19 | Wt 229.0 lb

## 2017-07-05 DIAGNOSIS — R5383 Other fatigue: Secondary | ICD-10-CM

## 2017-07-05 DIAGNOSIS — M1990 Unspecified osteoarthritis, unspecified site: Secondary | ICD-10-CM

## 2017-07-05 DIAGNOSIS — E039 Hypothyroidism, unspecified: Secondary | ICD-10-CM

## 2017-07-05 DIAGNOSIS — R209 Unspecified disturbances of skin sensation: Secondary | ICD-10-CM

## 2017-07-05 DIAGNOSIS — D471 Chronic myeloproliferative disease: Secondary | ICD-10-CM

## 2017-07-05 LAB — CBC WITH DIFFERENTIAL (CANCER CENTER ONLY)
BASOS ABS: 0 10*3/uL (ref 0.0–0.1)
Basophils Relative: 0 %
Eosinophils Absolute: 0.1 10*3/uL (ref 0.0–0.5)
Eosinophils Relative: 1 %
HCT: 40.8 % (ref 34.8–46.6)
HEMOGLOBIN: 13.3 g/dL (ref 11.6–15.9)
LYMPHS ABS: 2.8 10*3/uL (ref 0.9–3.3)
LYMPHS PCT: 19 %
MCH: 30.7 pg (ref 26.0–34.0)
MCHC: 32.6 g/dL (ref 32.0–36.0)
MCV: 94.2 fL (ref 81.0–101.0)
Monocytes Absolute: 0.9 10*3/uL (ref 0.1–0.9)
Monocytes Relative: 6 %
NEUTROS PCT: 74 %
Neutro Abs: 10.9 10*3/uL — ABNORMAL HIGH (ref 1.5–6.5)
Platelet Count: 285 10*3/uL (ref 145–400)
RBC: 4.33 MIL/uL (ref 3.70–5.32)
RDW: 12.7 % (ref 11.1–15.7)
WBC: 14.7 10*3/uL — AB (ref 3.9–10.0)

## 2017-07-05 LAB — CMP (CANCER CENTER ONLY)
ALBUMIN: 3.7 g/dL (ref 3.5–5.0)
ALT: 20 U/L (ref 10–47)
ANION GAP: 7 (ref 5–15)
AST: 22 U/L (ref 11–38)
Alkaline Phosphatase: 128 U/L — ABNORMAL HIGH (ref 26–84)
BILIRUBIN TOTAL: 0.7 mg/dL (ref 0.2–1.6)
BUN: 11 mg/dL (ref 7–22)
CALCIUM: 10.7 mg/dL — AB (ref 8.0–10.3)
CO2: 29 mmol/L (ref 18–33)
CREATININE: 0.4 mg/dL — AB (ref 0.60–1.20)
Chloride: 105 mmol/L (ref 98–108)
GLUCOSE: 81 mg/dL (ref 73–118)
Potassium: 3.8 mmol/L (ref 3.3–4.7)
Sodium: 141 mmol/L (ref 128–145)
TOTAL PROTEIN: 7.4 g/dL (ref 6.4–8.1)

## 2017-07-05 LAB — LACTATE DEHYDROGENASE: LDH: 160 U/L (ref 125–245)

## 2017-07-05 LAB — TECHNOLOGIST SMEAR REVIEW

## 2017-07-05 NOTE — Progress Notes (Signed)
Hematology and Oncology Follow Up Visit  Brianna Fletcher [id number] 1970/04/30 47 y.o. 07/05/2017   Principle Diagnosis:  Chronic myeloproliferative syndrome-Triple negative Sub-clinical hypothyroidism (+) p-ANCA  Current Therapy:   Peg-Interferon q weekly - q 4 week dosing - restart on 10/16/2016 Synthroid 0.050 mg po q day   Interim History:  Brianna Fletcher is here today for follow-up. She is still having fatigue. She feels that her hot flashes and mood swings are a little better. She has not started the Ditropan yet but plans to pick it up today.  She has had no fever, chills, n/v, cough, rash, SOB, chest pain, palpitations or changes in bowel or bladder habits.  She still has abdominal cramping and IBS.  She had one episodes of dizziness that came and went quickly last week. No falls or syncope.  She has numbness tingling in her fingers, toes and heels that comes and goes. She has generalized aches and pains due to arthritis.  She has maintained a good appetite and is hydrating well. Her weight is stable.   ECOG Performance Status: 1 - Symptomatic but completely ambulatory  Medications:  Allergies as of 07/05/2017      Reactions   Amitiza [lubiprostone]    Made her sick   Lisinopril Cough   Losartan Cough      Medication List        Accurate as of 07/05/17 11:15 AM. Always use your most recent med list.          amLODipine 10 MG tablet Commonly known as:  NORVASC TAKE 1 TABLET BY MOUTH EVERY DAY   aspirin 81 MG tablet Take 81 mg by mouth daily.   clotrimazole-betamethasone cream Commonly known as:  LOTRISONE APPLY 1 APPLICATION TOPICALLY 2 TIMES A DAY.   doxepin 25 MG capsule Commonly known as:  SINEQUAN Take 2 capsules (50 mg total) by mouth at bedtime.   furosemide 20 MG tablet Commonly known as:  LASIX TAKE 1 TABLET BY MOUTH EVERY DAY   KLOR-CON M20 20 MEQ tablet Generic drug:  potassium chloride SA Take 20 mEq by mouth 2 (two) times daily.     levothyroxine 50 MCG tablet Commonly known as:  SYNTHROID, LEVOTHROID Take 1 tablet (50 mcg total) daily before breakfast by mouth.   omeprazole 20 MG capsule Commonly known as:  PRILOSEC TAKE 1 CAPSULE (20 MG TOTAL) BY MOUTH DAILY.   oxybutynin 10 MG 24 hr tablet Commonly known as:  DITROPAN-XL Take 1 tablet (10 mg total) by mouth at bedtime.   PEGASYS 180 MCG/ML injection Generic drug:  peginterferon alfa-2a Inject 0.45 mLs (81 mcg total) into the skin every 14 (fourteen) days.       Allergies:  Allergies  Allergen Reactions  . Amitiza [Lubiprostone]     Made her sick  . Lisinopril Cough  . Losartan Cough    Past Medical History, Surgical history, Social history, and Family History were reviewed and updated.  Review of Systems: All other 10 point review of systems is negative.   Physical Exam:  vitals were not taken for this visit.   Wt Readings from Last 3 Encounters:  05/24/17 227 lb (103 kg)  04/12/17 225 lb (102.1 kg)  02/08/17 232 lb 6.4 oz (105.4 kg)    Ocular: Sclerae unicteric, pupils equal, round and reactive to light Ear-nose-throat: Oropharynx clear, dentition fair Lymphatic: No cervical, supraclavicular or axillary adenopathy Lungs no rales or rhonchi, good excursion bilaterally Heart regular rate and rhythm, no murmur appreciated Abd  soft, nontender, positive bowel sounds, no liver or spleen tip palpated on exam, no fluid wave  MSK no focal spinal tenderness, no joint edema Neuro: non-focal, well-oriented, appropriate affect Breasts: Deferred   Lab Results  Component Value Date   WBC 15.1 (H) 05/24/2017   HGB 14.1 02/08/2017   HCT 42.1 05/24/2017   MCV 94.0 05/24/2017   PLT 294 05/24/2017   Lab Results  Component Value Date   FERRITIN 86 09/23/2016   IRON 83 09/23/2016   TIBC 293 09/23/2016   UIBC 209 09/23/2016   IRONPCTSAT 28 09/23/2016   Lab Results  Component Value Date   RETICCTPCT 2.1 04/12/2017   RBC 4.48 05/24/2017    RETICCTABS 116.1 11/20/2013   No results found for: KPAFRELGTCHN, LAMBDASER, KAPLAMBRATIO No results found for: IGGSERUM, IGA, IGMSERUM No results found for: Odetta Pink, SPEI   Chemistry      Component Value Date/Time   NA 143 05/24/2017 0944   NA 140 02/08/2017 0925   NA 136 03/10/2016 1147   K 3.1 (L) 05/24/2017 0944   K 3.4 02/08/2017 0925   K 3.9 03/10/2016 1147   CL 108 05/24/2017 0944   CL 105 02/08/2017 0925   CO2 27 05/24/2017 0944   CO2 26 02/08/2017 0925   CO2 24 03/10/2016 1147   BUN 12 05/24/2017 0944   BUN 10 02/08/2017 0925   BUN 10.2 03/10/2016 1147   CREATININE 0.90 05/24/2017 0944   CREATININE 0.7 02/08/2017 0925   CREATININE 0.7 03/10/2016 1147      Component Value Date/Time   CALCIUM 10.9 (H) 05/24/2017 0944   CALCIUM 10.4 (H) 02/08/2017 0925   CALCIUM 10.7 (H) 03/10/2016 1147   ALKPHOS 118 (H) 05/24/2017 0944   ALKPHOS 106 (H) 02/08/2017 0925   ALKPHOS 132 03/10/2016 1147   AST 16 05/24/2017 0944   AST 12 03/10/2016 1147   ALT 13 05/24/2017 0944   ALT 16 02/08/2017 0925   ALT 8 03/10/2016 1147   BILITOT 0.9 05/24/2017 0944   BILITOT 0.63 03/10/2016 1147      Impression and Plan: Brianna Fletcher is a pleasant 47 yo caucasian female with a myeloproliferative syndrome though all work up so far has been negative.  She is still off of treatment due to losing her insurance. She is hoping her disability and Medicaid will come through soon.  Her WBC count is stable at 14.7, Hgb 13.3 and platelet count is 285.  We will continue to follow along with her and plan to see her back in another 6 weeks.  She will contact our office with any questions or concerns. We can certainly see her sooner if need be.   Laverna Peace, NP 4/16/201911:15 AM

## 2017-07-14 ENCOUNTER — Other Ambulatory Visit: Payer: Self-pay | Admitting: Family Medicine

## 2017-07-14 DIAGNOSIS — E876 Hypokalemia: Secondary | ICD-10-CM

## 2017-07-14 DIAGNOSIS — D471 Chronic myeloproliferative disease: Secondary | ICD-10-CM

## 2017-07-20 ENCOUNTER — Other Ambulatory Visit: Payer: Self-pay | Admitting: Family Medicine

## 2017-07-20 DIAGNOSIS — R600 Localized edema: Secondary | ICD-10-CM

## 2017-08-04 ENCOUNTER — Other Ambulatory Visit: Payer: Self-pay | Admitting: Family Medicine

## 2017-08-16 ENCOUNTER — Inpatient Hospital Stay: Payer: Medicaid Other

## 2017-08-16 ENCOUNTER — Inpatient Hospital Stay: Payer: Medicaid Other | Admitting: Family

## 2017-08-17 ENCOUNTER — Other Ambulatory Visit: Payer: Self-pay | Admitting: Family Medicine

## 2017-08-17 DIAGNOSIS — I10 Essential (primary) hypertension: Secondary | ICD-10-CM

## 2017-08-23 ENCOUNTER — Inpatient Hospital Stay: Payer: Medicaid Other | Attending: Hematology & Oncology

## 2017-08-23 ENCOUNTER — Other Ambulatory Visit: Payer: Self-pay

## 2017-08-23 ENCOUNTER — Encounter: Payer: Self-pay | Admitting: Family

## 2017-08-23 ENCOUNTER — Inpatient Hospital Stay (HOSPITAL_BASED_OUTPATIENT_CLINIC_OR_DEPARTMENT_OTHER): Payer: Self-pay | Admitting: Family

## 2017-08-23 VITALS — BP 134/73 | HR 58 | Temp 98.0°F | Resp 18 | Wt 235.8 lb

## 2017-08-23 DIAGNOSIS — E038 Other specified hypothyroidism: Secondary | ICD-10-CM

## 2017-08-23 DIAGNOSIS — D471 Chronic myeloproliferative disease: Secondary | ICD-10-CM | POA: Insufficient documentation

## 2017-08-23 DIAGNOSIS — D508 Other iron deficiency anemias: Secondary | ICD-10-CM

## 2017-08-23 LAB — CMP (CANCER CENTER ONLY)
ALT: 15 U/L (ref 10–47)
ANION GAP: 8 (ref 5–15)
AST: 17 U/L (ref 11–38)
Albumin: 3.6 g/dL (ref 3.5–5.0)
Alkaline Phosphatase: 129 U/L — ABNORMAL HIGH (ref 26–84)
BUN: 11 mg/dL (ref 7–22)
CO2: 24 mmol/L (ref 18–33)
Calcium: 10.2 mg/dL (ref 8.0–10.3)
Chloride: 107 mmol/L (ref 98–108)
Creatinine: 0.8 mg/dL (ref 0.60–1.20)
GLUCOSE: 123 mg/dL — AB (ref 73–118)
POTASSIUM: 3.6 mmol/L (ref 3.3–4.7)
Sodium: 139 mmol/L (ref 128–145)
TOTAL PROTEIN: 7.5 g/dL (ref 6.4–8.1)
Total Bilirubin: 1.1 mg/dL (ref 0.2–1.6)

## 2017-08-23 LAB — CBC WITH DIFFERENTIAL (CANCER CENTER ONLY)
BASOS ABS: 0 10*3/uL (ref 0.0–0.1)
Basophils Relative: 0 %
EOS PCT: 1 %
Eosinophils Absolute: 0.1 10*3/uL (ref 0.0–0.5)
HCT: 40 % (ref 34.8–46.6)
HEMOGLOBIN: 13.2 g/dL (ref 11.6–15.9)
LYMPHS ABS: 2.8 10*3/uL (ref 0.9–3.3)
LYMPHS PCT: 15 %
MCH: 30.4 pg (ref 26.0–34.0)
MCHC: 33 g/dL (ref 32.0–36.0)
MCV: 92.2 fL (ref 81.0–101.0)
Monocytes Absolute: 0.9 10*3/uL (ref 0.1–0.9)
Monocytes Relative: 5 %
NEUTROS ABS: 14.2 10*3/uL — AB (ref 1.5–6.5)
NEUTROS PCT: 79 %
PLATELETS: 300 10*3/uL (ref 145–400)
RBC: 4.34 MIL/uL (ref 3.70–5.32)
RDW: 12.8 % (ref 11.1–15.7)
WBC: 18 10*3/uL — AB (ref 3.9–10.0)

## 2017-08-23 LAB — LACTATE DEHYDROGENASE: LDH: 151 U/L (ref 125–245)

## 2017-08-23 LAB — TECHNOLOGIST SMEAR REVIEW

## 2017-08-23 NOTE — Progress Notes (Signed)
Hematology and Oncology Follow Up Visit  Brianna Fletcher [id number] [birth year] 47 y.o. 08/23/2017   Principle Diagnosis:  Chronic myeloproliferative syndrome-Triple negative Sub-clinical hypothyroidism (+) p-ANCA  Current Therapy:   Peg-Interferon q weekly - q 4 week dosing - On hold due to cost Synthroid 0.050 mg po q day   Interim History: Brianna Fletcher is here today for follow-up. She is still having fatigue and weakness. WBC count is 18.0, Hgb stable at 13.2 and platelet count 300. Iron studies are pending.  She has had no fever, chills, n/v, cough, rash, dizziness, SOB, chest pain, palpitations, abdominal pain or changes in bowel or bladder habits.  The swelling in her lower extremities waxes and wanes. She takes lasix daily as prescribed to help reduce edema.  She takes aspirin daily and states that she bruises easily and had one episode a month ago where she had bright red blood in her stool. No other episodes of bleeding. No petechiae.  No lymphadenopathy noted on exam.  She has generalized aches and pains "all over" due to fibromyalgia. No numbness and tingling in her extremities at this time.  She has a good appetite and is staying well hydrated. Her weight is stable.   ECOG Performance Status: 1 - Symptomatic but completely ambulatory  Medications:  Allergies as of 08/23/2017      Reactions   Amitiza [lubiprostone]    Made her sick   Lisinopril Cough   Losartan Cough      Medication List        Accurate as of 08/23/17  2:03 PM. Always use your most recent med list.          amLODipine 10 MG tablet Commonly known as:  NORVASC TAKE 1 TABLET BY MOUTH EVERY DAY   aspirin 81 MG tablet Take 81 mg by mouth daily.   clotrimazole-betamethasone cream Commonly known as:  LOTRISONE APPLY 1 APPLICATION TOPICALLY 2 TIMES A DAY.   doxepin 25 MG capsule Commonly known as:  SINEQUAN Take 2 capsules (50 mg total) by mouth at bedtime.   furosemide 20 MG tablet Commonly  known as:  LASIX TAKE 1 TABLET BY MOUTH EVERY DAY   KLOR-CON M20 20 MEQ tablet Generic drug:  potassium chloride SA Take 20 mEq by mouth 2 (two) times daily.   KLOR-CON M20 20 MEQ tablet Generic drug:  potassium chloride SA TAKE (1 TABLET) 20 MEQ BY MOUTH TWICE A DAY   levothyroxine 50 MCG tablet Commonly known as:  SYNTHROID, LEVOTHROID Take 1 tablet (50 mcg total) daily before breakfast by mouth.   omeprazole 20 MG capsule Commonly known as:  PRILOSEC TAKE 1 CAPSULE BY MOUTH EVERY DAY   oxybutynin 10 MG 24 hr tablet Commonly known as:  DITROPAN-XL Take 1 tablet (10 mg total) by mouth at bedtime.   PEGASYS 180 MCG/ML injection Generic drug:  peginterferon alfa-2a Inject 0.45 mLs (81 mcg total) into the skin every 14 (fourteen) days.       Allergies:  Allergies  Allergen Reactions  . Amitiza [Lubiprostone]     Made her sick  . Lisinopril Cough  . Losartan Cough    Past Medical History, Surgical history, Social history, and Family History were reviewed and updated.  Review of Systems: All other 10 point review of systems is negative.   Physical Exam:  vitals were not taken for this visit.   Wt Readings from Last 3 Encounters:  07/05/17 229 lb (103.9 kg)  05/24/17 227 lb (103 kg)  04/12/17  225 lb (102.1 kg)    Ocular: Sclerae unicteric, pupils equal, round and reactive to light Ear-nose-throat: Oropharynx clear, dentition fair Lymphatic: No cervical, supraclavicular or axillary adenopathy Lungs no rales or rhonchi, good excursion bilaterally Heart regular rate and rhythm, no murmur appreciated Abd soft, nontender, positive bowel sounds, no liver or spleen tip palpated on exam, no fluid wave  MSK no focal spinal tenderness, no joint edema Neuro: non-focal, well-oriented, appropriate affect Breasts: Deferred   Lab Results  Component Value Date   WBC 18.0 (H) 08/23/2017   HGB 13.2 08/23/2017   HCT 40.0 08/23/2017   MCV 92.2 08/23/2017   PLT 300  08/23/2017   Lab Results  Component Value Date   FERRITIN 86 09/23/2016   IRON 83 09/23/2016   TIBC 293 09/23/2016   UIBC 209 09/23/2016   IRONPCTSAT 28 09/23/2016   Lab Results  Component Value Date   RETICCTPCT 2.1 04/12/2017   RBC 4.34 08/23/2017   RETICCTABS 116.1 11/20/2013   No results found for: KPAFRELGTCHN, LAMBDASER, KAPLAMBRATIO No results found for: IGGSERUM, IGA, IGMSERUM No results found for: Kathrynn Ducking, MSPIKE, SPEI   Chemistry      Component Value Date/Time   NA 141 07/05/2017 1105   NA 140 02/08/2017 0925   NA 136 03/10/2016 1147   K 3.8 07/05/2017 1105   K 3.4 02/08/2017 0925   K 3.9 03/10/2016 1147   CL 105 07/05/2017 1105   CL 105 02/08/2017 0925   CO2 29 07/05/2017 1105   CO2 26 02/08/2017 0925   CO2 24 03/10/2016 1147   BUN 11 07/05/2017 1105   BUN 10 02/08/2017 0925   BUN 10.2 03/10/2016 1147   CREATININE 0.40 (L) 07/05/2017 1105   CREATININE 0.7 02/08/2017 0925   CREATININE 0.7 03/10/2016 1147      Component Value Date/Time   CALCIUM 10.7 (H) 07/05/2017 1105   CALCIUM 10.4 (H) 02/08/2017 0925   CALCIUM 10.7 (H) 03/10/2016 1147   ALKPHOS 128 (H) 07/05/2017 1105   ALKPHOS 106 (H) 02/08/2017 0925   ALKPHOS 132 03/10/2016 1147   AST 22 07/05/2017 1105   AST 12 03/10/2016 1147   ALT 20 07/05/2017 1105   ALT 16 02/08/2017 0925   ALT 8 03/10/2016 1147   BILITOT 0.7 07/05/2017 1105   BILITOT 0.63 03/10/2016 1147      Impression and Plan: Brianna Fletcher is a very pleasant 47 yo caucasian female with a myeloproliferative syndrome though all work up has been negative so far. WBC count is 18.0, no anemia and platelet count is stable.  She is symptomatic with fatigued and weakness. Iron studies are pending. We will see what these results show and bring her back in for infusion if needed.  She is still off the Peg-Interferon due to cost.  We will plan to see her back in another 8 weeks for follow-up.    She will contact our office with any questions or concerns. We can certainly see her sooner if need be.   Laverna Peace, NP 6/4/20192:03 PM

## 2017-08-24 LAB — FERRITIN: Ferritin: 50 ng/mL (ref 9–269)

## 2017-08-24 LAB — IRON AND TIBC
Iron: 98 ug/dL (ref 41–142)
SATURATION RATIOS: 30 % (ref 21–57)
TIBC: 328 ug/dL (ref 236–444)
UIBC: 230 ug/dL

## 2017-09-02 ENCOUNTER — Other Ambulatory Visit: Payer: Self-pay | Admitting: Family Medicine

## 2017-09-02 DIAGNOSIS — R600 Localized edema: Secondary | ICD-10-CM

## 2017-09-12 ENCOUNTER — Telehealth: Payer: Self-pay | Admitting: *Deleted

## 2017-09-12 NOTE — Telephone Encounter (Signed)
Received request for Medical Records from [place]; forwarded to [place] for email/scan/SLS 06/24

## 2017-09-14 ENCOUNTER — Encounter: Payer: Self-pay | Admitting: Family

## 2017-09-15 ENCOUNTER — Encounter: Payer: Self-pay | Admitting: Family

## 2017-09-28 ENCOUNTER — Other Ambulatory Visit: Payer: Self-pay | Admitting: Family Medicine

## 2017-09-28 DIAGNOSIS — R600 Localized edema: Secondary | ICD-10-CM

## 2017-09-28 NOTE — Telephone Encounter (Signed)
Author phone pt. to relay Dr. Nonda Lou message on medication list that pt. needs OV before refilling additional omeprazole or lasix. No answer, left VM to call back to make an appointment prior to refilling additional medications [phone].

## 2017-10-07 ENCOUNTER — Other Ambulatory Visit: Payer: Self-pay | Admitting: Family Medicine

## 2017-10-07 DIAGNOSIS — D471 Chronic myeloproliferative disease: Secondary | ICD-10-CM

## 2017-10-07 DIAGNOSIS — E041 Nontoxic single thyroid nodule: Secondary | ICD-10-CM

## 2017-10-10 NOTE — Telephone Encounter (Signed)
Levothyroxine rx request pended, as pt. Has not seen PCP since 11/2016, but is seeing oncology, with last TSH of 0.934 on 05/24/17. Please advise.

## 2017-10-24 ENCOUNTER — Telehealth: Payer: Self-pay | Admitting: Hematology & Oncology

## 2017-10-24 NOTE — Telephone Encounter (Signed)
Patient called in  Today to r/s her appt for 8/6 /  Appointments moved as requested

## 2017-10-25 ENCOUNTER — Inpatient Hospital Stay: Payer: Self-pay | Admitting: Family

## 2017-10-25 ENCOUNTER — Inpatient Hospital Stay: Payer: Self-pay

## 2017-10-27 ENCOUNTER — Other Ambulatory Visit: Payer: Self-pay | Admitting: Family Medicine

## 2017-10-27 ENCOUNTER — Other Ambulatory Visit: Payer: Self-pay

## 2017-10-27 ENCOUNTER — Inpatient Hospital Stay (HOSPITAL_BASED_OUTPATIENT_CLINIC_OR_DEPARTMENT_OTHER): Payer: Self-pay | Admitting: Hematology & Oncology

## 2017-10-27 ENCOUNTER — Encounter: Payer: Self-pay | Admitting: Hematology & Oncology

## 2017-10-27 ENCOUNTER — Inpatient Hospital Stay: Payer: Self-pay | Attending: Hematology & Oncology

## 2017-10-27 VITALS — BP 126/66 | HR 58 | Temp 98.1°F | Resp 18 | Wt 233.0 lb

## 2017-10-27 DIAGNOSIS — D471 Chronic myeloproliferative disease: Secondary | ICD-10-CM

## 2017-10-27 DIAGNOSIS — Z1231 Encounter for screening mammogram for malignant neoplasm of breast: Secondary | ICD-10-CM

## 2017-10-27 DIAGNOSIS — I10 Essential (primary) hypertension: Secondary | ICD-10-CM

## 2017-10-27 DIAGNOSIS — R600 Localized edema: Secondary | ICD-10-CM

## 2017-10-27 DIAGNOSIS — E038 Other specified hypothyroidism: Secondary | ICD-10-CM

## 2017-10-27 LAB — CMP (CANCER CENTER ONLY)
ALBUMIN: 4.2 g/dL (ref 3.5–5.0)
ALK PHOS: 148 U/L — AB (ref 38–126)
ALT: 11 U/L (ref 0–44)
AST: 17 U/L (ref 15–41)
Anion gap: 12 (ref 5–15)
BILIRUBIN TOTAL: 0.6 mg/dL (ref 0.3–1.2)
BUN: 14 mg/dL (ref 6–20)
CALCIUM: 11.1 mg/dL — AB (ref 8.9–10.3)
CO2: 23 mmol/L (ref 22–32)
Chloride: 105 mmol/L (ref 98–111)
Creatinine: 0.72 mg/dL (ref 0.44–1.00)
GFR, Est AFR Am: >60 mL/min (ref >60)
GFR, Estimated: >60 mL/min (ref >60)
Glucose, Bld: 101 mg/dL — ABNORMAL HIGH (ref 70–99)
Potassium: 4.1 mmol/L (ref 3.5–5.1)
SODIUM: 140 mmol/L (ref 135–145)
TOTAL PROTEIN: 8.2 g/dL — AB (ref 6.5–8.1)

## 2017-10-27 LAB — CBC WITH DIFFERENTIAL (CANCER CENTER ONLY)
Basophils Absolute: 0.1 10*3/uL (ref 0.0–0.1)
Basophils Relative: 0 %
EOS PCT: 1 %
Eosinophils Absolute: 0.2 10*3/uL (ref 0.0–0.5)
HCT: 42.5 % (ref 34.8–46.6)
Hemoglobin: 13.9 g/dL (ref 11.6–15.9)
LYMPHS ABS: 3.3 10*3/uL (ref 0.9–3.3)
LYMPHS PCT: 20 %
MCH: 30 pg (ref 26.0–34.0)
MCHC: 32.7 g/dL (ref 32.0–36.0)
MCV: 91.8 fL (ref 81.0–101.0)
MONO ABS: 1 10*3/uL — AB (ref 0.1–0.9)
Monocytes Relative: 6 %
Neutro Abs: 12.3 10*3/uL — ABNORMAL HIGH (ref 1.5–6.5)
Neutrophils Relative %: 73 %
Platelet Count: 317 10*3/uL (ref 145–400)
RBC: 4.63 MIL/uL (ref 3.70–5.32)
RDW: 12.8 % (ref 11.1–15.7)
WBC Count: 16.8 10*3/uL — ABNORMAL HIGH (ref 3.9–10.0)

## 2017-10-27 LAB — SAVE SMEAR

## 2017-10-27 LAB — LACTATE DEHYDROGENASE: LDH: 168 U/L (ref 98–192)

## 2017-10-27 NOTE — Progress Notes (Signed)
Hematology and Oncology Follow Up Visit  Brianna Fletcher [id number] 10-Mar-1971 47 y.o. 10/27/2017   Principle Diagnosis:  Chronic myeloproliferative syndrome-Triple negative Sub-clinical hypothyroidism (+) p-ANCA  Current Therapy:   Peg-Interferon q weekly - q 4 week dosing - On hold due to cost Synthroid 0.050 mg po q day   Interim History: Brianna Fletcher is here today for follow-up. She is still having fatigue and weakness.  She has been having a lot of arthralgias.  I do think that a lot of this is from the humidity that she does not do too well with.  Her she is not yet a grandmother.  Any day now her son and girlfriend will have a baby.  She will be a baby girl.  They live in [place].  She hopes to be able to go up in September.  She needs a mammogram.  Her last mammogram was 2 years ago.  She has had no fever.  She is had no bleeding.  There has been no change in bowel or bladder habits.  She is had no nausea or vomiting.  She is had no rashes.  Overall, her performance status is ECOG 1. table.   Medications:  Allergies as of 10/27/2017      Reactions   Amitiza [lubiprostone]    Made her sick   Lisinopril Cough   Losartan Cough      Medication List        Accurate as of 10/27/17 10:36 AM. Always use your most recent med list.          amLODipine 10 MG tablet Commonly known as:  NORVASC TAKE 1 TABLET BY MOUTH EVERY DAY   aspirin 81 MG tablet Take 81 mg by mouth daily.   clotrimazole-betamethasone cream Commonly known as:  LOTRISONE APPLY 1 APPLICATION TOPICALLY 2 TIMES A DAY.   doxepin 25 MG capsule Commonly known as:  SINEQUAN Take 2 capsules (50 mg total) by mouth at bedtime.   furosemide 20 MG tablet Commonly known as:  LASIX TAKE 1 TABLET BY MOUTH EVERY DAY   KLOR-CON M20 20 MEQ tablet Generic drug:  potassium chloride SA Take 20 mEq by mouth 2 (two) times daily.   levothyroxine 50 MCG tablet Commonly known as:  SYNTHROID, LEVOTHROID TAKE  1 TABLET (50 MCG TOTAL) DAILY BEFORE BREAKFAST BY MOUTH.   omeprazole 20 MG capsule Commonly known as:  PRILOSEC TAKE 1 CAPSULE BY MOUTH EVERY DAY   oxybutynin 10 MG 24 hr tablet Commonly known as:  DITROPAN-XL Take 1 tablet (10 mg total) by mouth at bedtime.   PEGASYS 180 MCG/ML injection Generic drug:  peginterferon alfa-2a Inject 0.45 mLs (81 mcg total) into the skin every 14 (fourteen) days.       Allergies:  Allergies  Allergen Reactions  . Amitiza [Lubiprostone]     Made her sick  . Lisinopril Cough  . Losartan Cough    Past Medical History, Surgical history, Social history, and Family History were reviewed and updated.  Review of Systems: Review of Systems  Constitutional: Positive for malaise/fatigue.  HENT: Negative.   Eyes: Negative.   Respiratory: Negative.   Cardiovascular: Negative.   Gastrointestinal: Positive for diarrhea.  Genitourinary: Negative.   Musculoskeletal: Positive for myalgias.  Skin: Negative.   Neurological: Positive for dizziness.  Psychiatric/Behavioral: Negative.    The  Physical Exam:  weight is 233 lb (105.7 kg). Her oral temperature is 98.1 F (36.7 C). Her blood pressure is 126/66 and her pulse is  58 (abnormal). Her respiration is 18 and oxygen saturation is 96%.   Wt Readings from Last 3 Encounters:  10/27/17 233 lb (105.7 kg)  08/23/17 235 lb 12.8 oz (107 kg)  07/05/17 229 lb (103.9 kg)    Physical Exam  Constitutional: She is oriented to person, place, and time.  HENT:  Head: Normocephalic and atraumatic.  Mouth/Throat: Oropharynx is clear and moist.  Eyes: Pupils are equal, round, and reactive to light. EOM are normal.  Neck: Normal range of motion.  Cardiovascular: Normal rate, regular rhythm and normal heart sounds.  Pulmonary/Chest: Effort normal and breath sounds normal.  Abdominal: Soft. Bowel sounds are normal.  Musculoskeletal: Normal range of motion. She exhibits no edema, tenderness or deformity.    Lymphadenopathy:    She has no cervical adenopathy.  Neurological: She is alert and oriented to person, place, and time.  Skin: Skin is warm and dry. No rash noted. No erythema.  Psychiatric: She has a normal mood and affect. Her behavior is normal. Judgment and thought content normal.  Vitals reviewed.    Lab Results  Component Value Date   WBC 16.8 (H) 10/27/2017   HGB 13.9 10/27/2017   HCT 42.5 10/27/2017   MCV 91.8 10/27/2017   PLT 317 10/27/2017   Lab Results  Component Value Date   FERRITIN 50 08/23/2017   IRON 98 08/23/2017   TIBC 328 08/23/2017   UIBC 230 08/23/2017   IRONPCTSAT 30 08/23/2017   Lab Results  Component Value Date   RETICCTPCT 2.1 04/12/2017   RBC 4.63 10/27/2017   RETICCTABS 116.1 11/20/2013   No results found for: KPAFRELGTCHN, LAMBDASER, KAPLAMBRATIO No results found for: IGGSERUM, IGA, IGMSERUM No results found for: Kathrynn Ducking, MSPIKE, SPEI   Chemistry      Component Value Date/Time   NA 139 08/23/2017 1346   NA 140 02/08/2017 0925   NA 136 03/10/2016 1147   K 3.6 08/23/2017 1346   K 3.4 02/08/2017 0925   K 3.9 03/10/2016 1147   CL 107 08/23/2017 1346   CL 105 02/08/2017 0925   CO2 24 08/23/2017 1346   CO2 26 02/08/2017 0925   CO2 24 03/10/2016 1147   BUN 11 08/23/2017 1346   BUN 10 02/08/2017 0925   BUN 10.2 03/10/2016 1147   CREATININE 0.80 08/23/2017 1346   CREATININE 0.7 02/08/2017 0925   CREATININE 0.7 03/10/2016 1147      Component Value Date/Time   CALCIUM 10.2 08/23/2017 1346   CALCIUM 10.4 (H) 02/08/2017 0925   CALCIUM 10.7 (H) 03/10/2016 1147   ALKPHOS 129 (H) 08/23/2017 1346   ALKPHOS 106 (H) 02/08/2017 0925   ALKPHOS 132 03/10/2016 1147   AST 17 08/23/2017 1346   AST 12 03/10/2016 1147   ALT 15 08/23/2017 1346   ALT 16 02/08/2017 0925   ALT 8 03/10/2016 1147   BILITOT 1.1 08/23/2017 1346   BILITOT 0.63 03/10/2016 1147      Impression and Plan: Brianna Fletcher is  a very pleasant 47 yo caucasian female with a triple negative myeloproliferative syndrome though all work up has been negative so far.   I will send her blood off for another NGS evaluation.  It supplies been a couple years since we did this.  He might be interested to see if there are any changes.  She still will stay off the interferon.  We will order the mammogram for her.  We will have her come back to see [place]  in another couple months.  We had to make sure we work around her traveling to [place]. [name], MD 8/8/201910:36 AM

## 2017-11-08 ENCOUNTER — Ambulatory Visit (HOSPITAL_BASED_OUTPATIENT_CLINIC_OR_DEPARTMENT_OTHER)
Admission: RE | Admit: 2017-11-08 | Discharge: 2017-11-08 | Disposition: A | Discharge status: Home / Self Care | Payer: Self-pay | Source: Ambulatory Visit | Attending: Hematology & Oncology | Admitting: Hematology & Oncology

## 2017-11-08 DIAGNOSIS — Z1231 Encounter for screening mammogram for malignant neoplasm of breast: Secondary | ICD-10-CM | POA: Insufficient documentation

## 2017-11-29 ENCOUNTER — Other Ambulatory Visit: Payer: Self-pay | Admitting: Family Medicine

## 2017-11-29 DIAGNOSIS — R600 Localized edema: Secondary | ICD-10-CM

## 2017-12-01 ENCOUNTER — Other Ambulatory Visit: Payer: Self-pay

## 2017-12-01 ENCOUNTER — Encounter (HOSPITAL_BASED_OUTPATIENT_CLINIC_OR_DEPARTMENT_OTHER): Payer: Self-pay | Admitting: *Deleted

## 2017-12-01 ENCOUNTER — Emergency Department (HOSPITAL_BASED_OUTPATIENT_CLINIC_OR_DEPARTMENT_OTHER)
Admission: EM | Admit: 2017-12-01 | Discharge: 2017-12-01 | Disposition: A | Discharge status: Home / Self Care | Payer: Medicaid Other | Attending: Emergency Medicine | Admitting: Emergency Medicine

## 2017-12-01 DIAGNOSIS — Z79899 Other long term (current) drug therapy: Secondary | ICD-10-CM | POA: Insufficient documentation

## 2017-12-01 DIAGNOSIS — R197 Diarrhea, unspecified: Secondary | ICD-10-CM

## 2017-12-01 DIAGNOSIS — Z87891 Personal history of nicotine dependence: Secondary | ICD-10-CM | POA: Insufficient documentation

## 2017-12-01 DIAGNOSIS — I10 Essential (primary) hypertension: Secondary | ICD-10-CM | POA: Insufficient documentation

## 2017-12-01 DIAGNOSIS — E039 Hypothyroidism, unspecified: Secondary | ICD-10-CM | POA: Insufficient documentation

## 2017-12-01 DIAGNOSIS — Z7982 Long term (current) use of aspirin: Secondary | ICD-10-CM | POA: Insufficient documentation

## 2017-12-01 LAB — COMPREHENSIVE METABOLIC PANEL
ALK PHOS: 121 U/L (ref 38–126)
ALT: 12 U/L (ref 0–44)
ANION GAP: 11 (ref 5–15)
AST: 21 U/L (ref 15–41)
Albumin: 4.1 g/dL (ref 3.5–5.0)
BILIRUBIN TOTAL: 0.6 mg/dL (ref 0.3–1.2)
BUN: 9 mg/dL (ref 6–20)
CALCIUM: 10.3 mg/dL (ref 8.9–10.3)
CO2: 24 mmol/L (ref 22–32)
Chloride: 104 mmol/L (ref 98–111)
Creatinine, Ser: 0.63 mg/dL (ref 0.44–1.00)
GFR calc Af Amer: >60 mL/min (ref >60)
Glucose, Bld: 103 mg/dL — ABNORMAL HIGH (ref 70–99)
Potassium: 4 mmol/L (ref 3.5–5.1)
SODIUM: 139 mmol/L (ref 135–145)
TOTAL PROTEIN: 7.5 g/dL (ref 6.5–8.1)

## 2017-12-01 LAB — C DIFFICILE QUICK SCREEN W PCR REFLEX
C DIFFICLE (CDIFF) ANTIGEN: NEGATIVE
C Diff interpretation: NOT DETECTED
C Diff toxin: NEGATIVE

## 2017-12-01 LAB — CBC WITH DIFFERENTIAL/PLATELET
Basophils Absolute: 0 10*3/uL (ref 0.0–0.1)
Basophils Relative: 0 %
EOS ABS: 0.2 10*3/uL (ref 0.0–0.7)
Eosinophils Relative: 1 %
HCT: 40.6 % (ref 36.0–46.0)
HEMOGLOBIN: 13.5 g/dL (ref 12.0–15.0)
LYMPHS PCT: 17 %
Lymphs Abs: 2.8 10*3/uL (ref 0.7–4.0)
MCH: 30.5 pg (ref 26.0–34.0)
MCHC: 33.3 g/dL (ref 30.0–36.0)
MCV: 91.9 fL (ref 78.0–100.0)
Monocytes Absolute: 1.1 10*3/uL — ABNORMAL HIGH (ref 0.1–1.0)
Monocytes Relative: 7 %
Neutro Abs: 12.1 10*3/uL — ABNORMAL HIGH (ref 1.7–7.7)
Neutrophils Relative %: 75 %
Platelets: 324 10*3/uL (ref 150–400)
RBC: 4.42 MIL/uL (ref 3.87–5.11)
RDW: 13.1 % (ref 11.5–15.5)
WBC: 16.2 10*3/uL — ABNORMAL HIGH (ref 4.0–10.5)

## 2017-12-01 LAB — LIPASE, BLOOD: LIPASE: 27 U/L (ref 11–51)

## 2017-12-01 LAB — PREGNANCY, URINE: PREG TEST UR: NEGATIVE

## 2017-12-01 MED ORDER — AMOXICILLIN-POT CLAVULANATE 875-125 MG PO TABS: 1.0000 | ORAL_TABLET | Freq: Two times a day (BID) | ORAL | 0 refills | Status: DC

## 2017-12-01 MED ORDER — AZITHROMYCIN 250 MG PO TABS: ORAL_TABLET | ORAL | 0 refills | Status: DC

## 2017-12-01 NOTE — ED Provider Notes (Signed)
[facility] EMERGENCY DEPARTMENT Provider Note   CSN: [id number] Arrival date & time: 12/01/17  1443     History   Chief Complaint Chief Complaint  Patient presents with  . Abdominal Pain    HPI Brianna Fletcher is a 47 y.o. female.  47 yo F still has a chief complaint diarrhea.  Going on for the past 5 days.  The patient has a history of episodic diarrhea in the past.  However this time it started after she ate at a restaurant with her mother.  Her mother also got a diarrheal illness.  Also going on for about the same amount of time.  She denies blood or dark stool.  She has felt bad but not had a measurable temperature at home.  Describes abdominal cramping  The history is provided by the patient.  Abdominal Pain   This is a new problem. The current episode started more than 2 days ago. The problem occurs constantly. The problem has been gradually improving. The pain is associated with eating. The pain is located in the generalized abdominal region. The quality of the pain is cramping and aching. The pain is at a severity of 3/10. The pain is mild. Associated symptoms include diarrhea. Pertinent negatives include fever, nausea, vomiting, dysuria, headaches, arthralgias and myalgias. Nothing aggravates the symptoms. Nothing relieves the symptoms.    Past Medical History:  Diagnosis Date  . Arthritis   . Blood dyscrasia    produces too many WBCs  . Chronic neutrophilia 11/15/2012  . Family history of heart disease    mother had stents placed in her 29s and had myocardial infarctions  . Glaucoma   . Hypertension   . Leukocytosis, unspecified 11/15/2012  . Monocytosis 11/15/2012  . Myeloproliferative disorder ([place]) 03/08/2013  . Precordial chest pain   . Pruritus 11/15/2012  . Radial artery occlusion, right (HCC)    status post  right radial artery cardiac catheterization  . Vaginal delivery 1986, 2000    Patient Active Problem List   Diagnosis Date Noted  .  Hyperlipidemia 12/09/2016  . Hypothyroidism due to non-medication exogenous substances 09/23/2016  . Bruising 05/09/2016  . Change in stool habits 05/11/2015  . Rectal bleeding 05/11/2015  . S/P hysterectomy 03/05/2015  . LUQ pain 01/14/2015  . Constipation 01/14/2015  . Severe obesity (BMI >= 40) ([place]) 12/23/2014  . Morbid obesity ([place]) 12/23/2014  . Radial artery occlusion, right ([place]) 12/20/2014  . HTN (hypertension) 11/04/2014  . Right arm pain 11/04/2014  . HTN, goal below 130/80 11/04/2014  . Cough 11/04/2014  . Chest pain 10/01/2014  . Vitamin D deficiency 08/26/2014  . Cramp in lower leg 08/26/2014  . Right thyroid nodule 07/22/2014  . Hyperparathyroidism ([place]) 07/22/2014  . Myeloproliferative disorder ([place]) 03/08/2013  . Leukocytosis 11/15/2012  . Chronic neutrophilia 11/15/2012  . Pruritus 11/15/2012  . Monocytosis 11/15/2012    Past Surgical History:  Procedure Laterality Date  . CARDIAC CATHETERIZATION N/A 10/03/2014   Procedure: Left Heart Cath and Coronary Angiography;  Surgeon: Lorretta Harp, MD;  Location: [facility];  Service: Cardiovascular;  Laterality: N/A;  . DILITATION & CURRETTAGE/HYSTROSCOPY WITH NOVASURE ABLATION N/A 11/08/2014   Procedure: DILATATION & CURETTAGE/HYSTEROSCOPY WITH NOVASURE ABLATION;  Surgeon: Linda Hedges, DO;  Location: [facility];  Service: Gynecology;  Laterality: N/A;  . EYE SURGERY     laser eye surgery  . LAPAROSCOPIC VAGINAL HYSTERECTOMY WITH SALPINGECTOMY Bilateral 03/05/2015   Procedure: LAPAROSCOPIC ASSISTED VAGINAL HYSTERECTOMY WITH SALPINGECTOMY;  Surgeon: Linda Hedges, DO;  Location: [facility];  Service: Gynecology;  Laterality: Bilateral;  . TUBAL LIGATION  2000     OB History   None      Home Medications    Prior to Admission medications   Medication Sig Start Date End Date Taking? Authorizing Provider  amLODipine (NORVASC) 10 MG tablet TAKE 1 TABLET BY MOUTH EVERY DAY 10/28/17   Carollee Herter, Alferd Apa, DO    amoxicillin-clavulanate (AUGMENTIN) 875-125 MG tablet Take 1 tablet by mouth every 12 (twelve) hours. 12/01/17   Deno Etienne, DO  aspirin 81 MG tablet Take 81 mg by mouth daily.    [provider]  clotrimazole-betamethasone (LOTRISONE) cream APPLY 1 APPLICATION TOPICALLY 2 TIMES A DAY. 02/08/17   Ann Held, DO  doxepin (SINEQUAN) 25 MG capsule Take 2 capsules (50 mg total) by mouth at bedtime. 11/03/16   [name], MD  furosemide (LASIX) 20 MG tablet Take 1 tablet (20 mg total) by mouth daily. NEEDS OV 11/29/17   Lowne Chase, Yvonne R, DO  KLOR-CON M20 20 MEQ tablet Take 20 mEq by mouth 2 (two) times daily. 11/18/16   [provider]  levothyroxine (SYNTHROID, LEVOTHROID) 50 MCG tablet TAKE 1 TABLET (50 MCG TOTAL) DAILY BEFORE BREAKFAST BY MOUTH. 10/10/17   Carollee Herter, Yvonne R, DO  omeprazole (PRILOSEC) 20 MG capsule TAKE 1 CAPSULE BY MOUTH EVERY DAY.  Needs ov before any more refills 10/28/17   Carollee Herter, Kendrick Fries R, DO  oxybutynin (DITROPAN-XL) 10 MG 24 hr tablet Take 1 tablet (10 mg total) by mouth at bedtime. 05/26/17   [name], MD  PEGASYS 180 MCG/ML injection Inject 0.45 mLs (81 mcg total) into the skin every 14 (fourteen) days. 11/12/16   [name], MD    Family History Family History  Problem Relation Age of Onset  . Heart disease Mother   . Hypertension Mother   . Hyperlipidemia Mother   . Diabetes Father   . Cancer Father        esopageal ?  . Dementia Maternal Grandmother   . Heart disease Maternal Grandmother   . Heart disease Maternal Grandfather 6       MI  . Heart disease Cousin   . Other Neg Hx        hyperparathyroidism    Social History Social History   Tobacco Use  . Smoking status: Former Smoker    Types: Cigarettes    Start date: 08/21/2014  . Smokeless tobacco: Never Used  . Tobacco comment: patient reports light tobacco use, has not smoked since friday (09/27/14)  Substance Use Topics  . Alcohol use: Yes     Alcohol/week: 6.0 standard drinks    Types: 6 Cans of beer per week  . Drug use: No     Allergies   Amitiza [lubiprostone]; Lisinopril; and Losartan   Review of Systems Review of Systems  Constitutional: Negative for chills and fever.  HENT: Negative for congestion and rhinorrhea.   Eyes: Negative for redness and visual disturbance.  Respiratory: Negative for shortness of breath and wheezing.   Cardiovascular: Negative for chest pain and palpitations.  Gastrointestinal: Positive for abdominal pain and diarrhea. Negative for nausea and vomiting.  Genitourinary: Negative for dysuria and urgency.  Musculoskeletal: Negative for arthralgias and myalgias.  Skin: Negative for pallor and wound.  Neurological: Negative for dizziness and headaches.     Physical Exam Updated Vital Signs BP (!) 151/81   Pulse 70  Temp 98.4 F (36.9 C)   Resp 16   Ht 5\' 6"  (1.676 m)   Wt 104.3 kg   SpO2 100%   BMI 37.12 kg/m   Physical Exam  Constitutional: She is oriented to person, place, and time. She appears well-developed and well-nourished. No distress.  HENT:  Head: Normocephalic and atraumatic.  Eyes: Pupils are equal, round, and reactive to light. EOM are normal.  Neck: Normal range of motion. Neck supple.  Cardiovascular: Normal rate and regular rhythm. Exam reveals no gallop and no friction rub.  No murmur heard. Pulmonary/Chest: Effort normal. She has no wheezes. She has no rales.  Abdominal: Soft. She exhibits no distension and no mass. There is no tenderness. There is no guarding.  Musculoskeletal: She exhibits no edema or tenderness.  Neurological: She is alert and oriented to person, place, and time.  Skin: Skin is warm and dry. She is not diaphoretic.  Psychiatric: She has a normal mood and affect. Her behavior is normal.  Nursing note and vitals reviewed.    ED Treatments / Results  Labs (all labs ordered are listed, but only abnormal results are displayed) Labs  Reviewed  CBC WITH DIFFERENTIAL/PLATELET - Abnormal; Notable for the following components:      Result Value   WBC 16.2 (*)    Neutro Abs 12.1 (*)    Monocytes Absolute 1.1 (*)    All other components within normal limits  COMPREHENSIVE METABOLIC PANEL - Abnormal; Notable for the following components:   Glucose, Bld 103 (*)    All other components within normal limits  GASTROINTESTINAL PANEL BY PCR, STOOL (REPLACES STOOL CULTURE)  C DIFFICILE QUICK SCREEN W PCR REFLEX  LIPASE, BLOOD  PREGNANCY, URINE    EKG None  Radiology No results found.  Procedures Procedures (including critical care time)  Medications Ordered in ED Medications - No data to display   Initial Impression / Assessment and Plan / ED Course  I have reviewed the triage vital signs and the nursing notes.  Pertinent labs & imaging results that were available during my care of the patient were reviewed by me and considered in my medical decision making (see chart for details).     47 yo F with a cc of diarrhea.  Going on for 5 days.  Sounds viral by hx with mom with similar illness after eating same food.  Benign abdominal exam.  Screening labs for hepatitis, send off stool sample.    Labs with mild leuokocytosis.  LFT's normal.  Will treat with antibiotics.  PCP follow up.   5:32 PM:  I have discussed the diagnosis/risks/treatment options with the patient and believe the pt to be eligible for discharge home to follow-up with PCP. We also discussed returning to the ED immediately if new or worsening sx occur. We discussed the sx which are most concerning (e.g., sudden worsening pain, fever, inability to tolerate by mouth) that necessitate immediate return. Medications administered to the patient during their visit and any new prescriptions provided to the patient are listed below.  Medications given during this visit Medications - No data to display   The patient appears reasonably screen and/or stabilized  for discharge and I doubt any other medical condition or other [facility] requiring further screening, evaluation, or treatment in the ED at this time prior to discharge.    Final Clinical Impressions(s) / ED Diagnoses   Final diagnoses:  Diarrhea of presumed infectious origin    ED Discharge Orders  Ordered    amoxicillin-clavulanate (AUGMENTIN) 875-125 MG tablet  Every 12 hours     12/01/17 Justice, [place], DO 12/01/17 1732

## 2017-12-01 NOTE — ED Triage Notes (Signed)
Pt c/o abd pain with diarrhea x 6 days

## 2017-12-01 NOTE — Discharge Instructions (Signed)
Take imodium if you like.  Follow up with your PCP.

## 2017-12-02 ENCOUNTER — Telehealth (HOSPITAL_BASED_OUTPATIENT_CLINIC_OR_DEPARTMENT_OTHER): Payer: Self-pay | Admitting: *Deleted

## 2017-12-02 LAB — GASTROINTESTINAL PANEL BY PCR, STOOL (REPLACES STOOL CULTURE)
ASTROVIRUS: NOT DETECTED
Adenovirus F40/41: NOT DETECTED
CAMPYLOBACTER SPECIES: NOT DETECTED
Cryptosporidium: NOT DETECTED
Cyclospora cayetanensis: NOT DETECTED
ENTAMOEBA HISTOLYTICA: NOT DETECTED
ENTEROAGGREGATIVE E COLI (EAEC): NOT DETECTED
ENTEROTOXIGENIC E COLI (ETEC): DETECTED — AB
Enteropathogenic E coli (EPEC): NOT DETECTED
Giardia lamblia: NOT DETECTED
NOROVIRUS GI/GII: NOT DETECTED
Plesimonas shigelloides: NOT DETECTED
ROTAVIRUS A: NOT DETECTED
SHIGA LIKE TOXIN PRODUCING E COLI (STEC): NOT DETECTED
Salmonella species: NOT DETECTED
Sapovirus (I, II, IV, and V): NOT DETECTED
Shigella/Enteroinvasive E coli (EIEC): NOT DETECTED
VIBRIO CHOLERAE: NOT DETECTED
Vibrio species: NOT DETECTED
Yersinia enterocolitica: NOT DETECTED

## 2017-12-07 ENCOUNTER — Telehealth: Payer: Self-pay | Admitting: *Deleted

## 2017-12-07 NOTE — Telephone Encounter (Signed)
Patient is wanting Dr Marin Olp to order refills on medication in which he did not prescribe. Her PCP isn't able to fill them because she hasn't been seen in over a year.  Reviewed with Dr Marin Olp. At this time he is unable to fill these medications that control multiple chronic health conditions as he doesn't manage those conditions. Recommended to the patient that she follow back up with her PCP and if she'd unable to be seen there, to ask about what other community resources are available for her. She understood.

## 2017-12-08 ENCOUNTER — Ambulatory Visit (INDEPENDENT_AMBULATORY_CARE_PROVIDER_SITE_OTHER): Payer: Self-pay | Admitting: Family Medicine

## 2017-12-08 ENCOUNTER — Encounter: Payer: Self-pay | Admitting: Family Medicine

## 2017-12-08 VITALS — BP 114/60 | HR 61 | Temp 98.1°F | Resp 16 | Ht 66.0 in | Wt 231.6 lb

## 2017-12-08 DIAGNOSIS — E039 Hypothyroidism, unspecified: Secondary | ICD-10-CM

## 2017-12-08 DIAGNOSIS — K219 Gastro-esophageal reflux disease without esophagitis: Secondary | ICD-10-CM

## 2017-12-08 DIAGNOSIS — A498 Other bacterial infections of unspecified site: Secondary | ICD-10-CM

## 2017-12-08 DIAGNOSIS — B369 Superficial mycosis, unspecified: Secondary | ICD-10-CM

## 2017-12-08 DIAGNOSIS — E785 Hyperlipidemia, unspecified: Secondary | ICD-10-CM

## 2017-12-08 DIAGNOSIS — I10 Essential (primary) hypertension: Secondary | ICD-10-CM

## 2017-12-08 DIAGNOSIS — R109 Unspecified abdominal pain: Secondary | ICD-10-CM

## 2017-12-08 LAB — LIPID PANEL
CHOLESTEROL: 188 mg/dL (ref 0–200)
HDL: 44.7 mg/dL (ref >39.00)
NonHDL: 143.6
TRIGLYCERIDES: 234 mg/dL — AB (ref 0.0–149.0)
Total CHOL/HDL Ratio: 4
VLDL: 46.8 mg/dL — AB (ref 0.0–40.0)

## 2017-12-08 LAB — COMPREHENSIVE METABOLIC PANEL
ALBUMIN: 4.3 g/dL (ref 3.5–5.2)
ALT: 7 U/L (ref 0–35)
AST: 11 U/L (ref 0–37)
Alkaline Phosphatase: 132 U/L — ABNORMAL HIGH (ref 39–117)
BILIRUBIN TOTAL: 0.7 mg/dL (ref 0.2–1.2)
BUN: 8 mg/dL (ref 6–23)
CALCIUM: 10.6 mg/dL — AB (ref 8.4–10.5)
CO2: 25 mEq/L (ref 19–32)
Chloride: 102 mEq/L (ref 96–112)
Creatinine, Ser: 0.57 mg/dL (ref 0.40–1.20)
GFR: 119.47 mL/min (ref >60.00)
GLUCOSE: 92 mg/dL (ref 70–99)
POTASSIUM: 3.8 meq/L (ref 3.5–5.1)
Sodium: 136 mEq/L (ref 135–145)
TOTAL PROTEIN: 7.6 g/dL (ref 6.0–8.3)

## 2017-12-08 LAB — LDL CHOLESTEROL, DIRECT: Direct LDL: 120 mg/dL

## 2017-12-08 LAB — TSH: TSH: 1.19 u[IU]/mL (ref 0.35–4.50)

## 2017-12-08 MED ORDER — DICYCLOMINE HCL 10 MG PO CAPS: 10.0000 mg | ORAL_CAPSULE | Freq: Three times a day (TID) | ORAL | 0 refills | Status: DC

## 2017-12-08 MED ORDER — OMEPRAZOLE 20 MG PO CPDR: DELAYED_RELEASE_CAPSULE | ORAL | 0 refills | Status: DC

## 2017-12-08 MED ORDER — CLOTRIMAZOLE-BETAMETHASONE 1-0.05 % EX CREA: TOPICAL_CREAM | CUTANEOUS | 0 refills | Status: DC

## 2017-12-08 MED ORDER — AMLODIPINE BESYLATE 10 MG PO TABS: 10.0000 mg | ORAL_TABLET | Freq: Every day | ORAL | 5 refills | Status: DC

## 2017-12-08 NOTE — Patient Instructions (Signed)
Food Poisoning Food poisoning is an illness that is caused by eating or drinking contaminated foods or drinks. Food poisoning is usually mild and lasts 1-2 days. Foods and drinks can become contaminated because of:  Poor personal hygiene, such as not washing hands well enough or often.  Not storing food properly. For example, not refrigerating raw meat.  Serving, preparing, and storing food on surfaces that are not clean.  Cooking or eating with utensils that are not clean.  The most common causes of this condition are:  Viruses.  Bacteria.  Parasites.  Follow these instructions at home: Eating and drinking   Drink enough fluids to keep your pee (urine) clear or pale yellow. You may need to drink small amounts of clear liquids often.  Avoid: ? Milk. ? Caffeine. ? Alcohol.  Ask your doctor exactly how you should get enough fluid in your body (rehydrate).  Eat small meals often rather than eating large meals. Medicines  Take over-the-counter and prescription medicines only as told by your doctor. Ask your doctor if you should continue to take any of your regular prescribed and over-the-counter medicines.  If you were prescribed an antibiotic medicine, take it as told by your doctor. Do not stop taking the antibiotic even if you start to feel better. General instructions   Wash your hands fully before you prepare food and after you go to the bathroom (use the toilet). Make sure people who live with you also wash their hands often.  Clean surfaces that you touch with a product that contains chlorine bleach.  Keep all follow-up visits as told by your doctor. This is important. How is this prevented?  Wash your hands, food preparation surfaces, and utensils before and after you handle raw foods.  Use separate food preparation surfaces and storage spaces for raw meat and for fruits and vegetables.  Keep refrigerated foods colder than 63F (5C).  Serve hot foods right  away or keep them heated above 163F (60C).  Store dry foods in cool, dry spaces. Keep them away from too much heat or moisture.  Throw out any foods that: ? Do not smell right. ? Are in cans that are bulging.  Follow approved canning procedures.  Heat canned foods fully before you taste them.  Drink bottled or sterile water when you travel. Get help right away if: Call 911 or go to the emergency room if:  You have trouble: ? Breathing. ? Swallowing. ? Talking. ? Moving.  You have blurry vision.  You cannot eat or drink without throwing up (vomiting).  You pass out (faint).  Your eyes turn yellow.  You continue to throw up or have watery poop (diarrhea).  You start to have pain in your belly (abdomen), your belly pain gets worse, or your belly pain stays in one small area.  You have a fever.  You have blood or mucus in your poop (stools), or your poop looks dark black and tarry.  You have signs of dehydration, such as: ? Dark pee, very little pee, or no pee. ? Cracked lips. ? Not making tears while crying. ? Dry mouth. ? Sunken eyes. ? Sleepiness. ? Weakness. ? Dizziness.  This information is not intended to replace advice given to you by your health care provider. Make sure you discuss any questions you have with your health care provider. Document Released: 08/26/2009 Document Revised: 08/14/2015 Document Reviewed: 09/09/2014 Elsevier Interactive Patient Education  [employer].

## 2017-12-08 NOTE — Progress Notes (Signed)
Patient ID: Brianna Fletcher, female    DOB: 11/21/1970  Age: 47 y.o. MRN: [id number]    Subjective:  Subjective  HPI Brianna Fletcher presents for f/u diarrhea --- + e coli Pt was treated with augmentin.  The diarrhea has stopped but she still has abd pain.  No NVD.  No fever.  Er visit reviewed.    Review of Systems  Constitutional: Negative for activity change, appetite change, fatigue and unexpected weight change.  Respiratory: Negative for cough and shortness of breath.   Cardiovascular: Negative for chest pain and palpitations.  Gastrointestinal: Positive for abdominal pain. Negative for abdominal distention, anal bleeding, blood in stool, constipation, diarrhea, nausea, rectal pain and vomiting.  Psychiatric/Behavioral: Negative for behavioral problems and dysphoric mood. The patient is not nervous/anxious.     History Past Medical History:  Diagnosis Date  . Arthritis   . Blood dyscrasia    produces too many WBCs  . Chronic neutrophilia 11/15/2012  . Family history of heart disease    mother had stents placed in her 7s and had myocardial infarctions  . Glaucoma   . Hypertension   . Leukocytosis, unspecified 11/15/2012  . Monocytosis 11/15/2012  . Myeloproliferative disorder ([place]) 03/08/2013  . Precordial chest pain   . Pruritus 11/15/2012  . Radial artery occlusion, right (HCC)    status post  right radial artery cardiac catheterization  . Vaginal delivery 1986, 2000    She has a past surgical history that includes Tubal ligation (2000); Eye surgery; Cardiac catheterization (N/A, 10/03/2014); Dilatation & currettage/hysteroscopy with novasure ablation (N/A, 11/08/2014); and Laparoscopic vaginal hysterectomy with salpingectomy (Bilateral, 03/05/2015).   Her family history includes Cancer in her father; Dementia in her maternal grandmother; Diabetes in her father; Heart disease in her cousin, maternal grandmother, and mother; Heart disease (age of onset: 69) in her maternal  grandfather; Hyperlipidemia in her mother; Hypertension in her mother.She reports that she has quit smoking. Her smoking use included cigarettes. She started smoking about 3 years ago. She has never used smokeless tobacco. She reports that she drinks about 6.0 standard drinks of alcohol per week. She reports that she does not use drugs.  Current Outpatient Medications on File Prior to Visit  Medication Sig Dispense Refill  . aspirin 81 MG tablet Take 81 mg by mouth daily.    Marland Kitchen doxepin (SINEQUAN) 25 MG capsule Take 2 capsules (50 mg total) by mouth at bedtime. 60 capsule 3  . furosemide (LASIX) 20 MG tablet Take 1 tablet (20 mg total) by mouth daily. NEEDS OV 30 tablet 0  . KLOR-CON M20 20 MEQ tablet Take 20 mEq by mouth 2 (two) times daily.  1  . levothyroxine (SYNTHROID, LEVOTHROID) 50 MCG tablet TAKE 1 TABLET (50 MCG TOTAL) DAILY BEFORE BREAKFAST BY MOUTH. 90 tablet 1  . PEGASYS 180 MCG/ML injection Inject 0.45 mLs (81 mcg total) into the skin every 14 (fourteen) days. 2 mL 3   Current Facility-Administered Medications on File Prior to Visit  Medication Dose Route Frequency Provider Last Rate Last Dose  . acetaminophen (TYLENOL) tablet 650 mg  650 mg Oral Once [name], MD      . peginterferon alfa-2a (PEGASYS) injection 81 mcg  81 mcg Subcutaneous Weekly [name], MD   81 mcg at 04/26/14 1530     Objective:  Objective  Physical Exam  Constitutional: She is oriented to person, place, and time. She appears well-developed and well-nourished.  HENT:  Head: Normocephalic and atraumatic.  Eyes: Conjunctivae and EOM are normal.  Neck: Normal range of motion. Neck supple. No JVD present. Carotid bruit is not present. No thyromegaly present.  Cardiovascular: Normal rate, regular rhythm and normal heart sounds.  No murmur heard. Pulmonary/Chest: Effort normal and breath sounds normal. No respiratory distress. She has no wheezes. She has no rales. She exhibits no tenderness.    Abdominal: Soft. She exhibits no distension and no mass. There is no tenderness. There is no rebound and no guarding. No hernia.  Musculoskeletal: She exhibits no edema.  Neurological: She is alert and oriented to person, place, and time.  Psychiatric: She has a normal mood and affect.   BP 114/60 (BP Location: Right Arm, Cuff Size: Large)   Pulse 61   Temp 98.1 F (36.7 C) (Oral)   Resp 16   Ht 5\' 6"  (1.676 m)   Wt 231 lb 9.6 oz (105.1 kg)   SpO2 98%   BMI 37.38 kg/m  Wt Readings from Last 3 Encounters:  12/08/17 231 lb 9.6 oz (105.1 kg)  12/01/17 230 lb (104.3 kg)  10/27/17 233 lb (105.7 kg)     Lab Results  Component Value Date   WBC 16.2 (H) 12/01/2017   HGB 13.5 12/01/2017   HCT 40.6 12/01/2017   PLT 324 12/01/2017   GLUCOSE 92 12/08/2017   CHOL 188 12/08/2017   TRIG 234.0 (H) 12/08/2017   HDL 44.70 12/08/2017   LDLDIRECT 120.0 12/08/2017   LDLCALC 112 (H) 12/09/2016   ALT 7 12/08/2017   AST 11 12/08/2017   NA 136 12/08/2017   K 3.8 12/08/2017   CL 102 12/08/2017   CREATININE 0.57 12/08/2017   BUN 8 12/08/2017   CO2 25 12/08/2017   TSH 1.19 12/08/2017   INR 0.9 05/06/2016   MICROALBUR <0.7 04/23/2016    No results found.   Assessment & Plan:  Plan  I have discontinued Brianna Fletcher's oxybutynin and azithromycin. I have also changed her amLODipine and omeprazole. Additionally, I am having her start on dicyclomine. Lastly, I am having her maintain her aspirin, doxepin, PEGASYS, KLOR-CON M20, levothyroxine, furosemide, and clotrimazole-betamethasone.  Meds ordered this encounter  Medications  . clotrimazole-betamethasone (LOTRISONE) cream    Sig: APPLY 1 APPLICATION TOPICALLY 2 TIMES A DAY.    Dispense:  30 g    Refill:  0  . amLODipine (NORVASC) 10 MG tablet    Sig: Take 1 tablet (10 mg total) by mouth daily.    Dispense:  30 tablet    Refill:  5  . omeprazole (PRILOSEC) 20 MG capsule    Sig: TAKE 1 CAPSULE BY MOUTH EVERY DAY.    Dispense:  30  capsule    Refill:  0  . dicyclomine (BENTYL) 10 MG capsule    Sig: Take 1 capsule (10 mg total) by mouth 4 (four) times daily -  before meals and at bedtime.    Dispense:  40 capsule    Refill:  0    Problem List Items Addressed This Visit      Unprioritized   E. coli infection    Diarrhea has stopped Add probiotic daily F/u in a few more days       Relevant Medications   clotrimazole-betamethasone (LOTRISONE) cream   HTN (hypertension)   Relevant Medications   amLODipine (NORVASC) 10 MG tablet   Other Relevant Orders   Comprehensive metabolic panel (Completed)   Lipid panel (Completed)   Hyperlipidemia   Relevant Medications   amLODipine (NORVASC)  10 MG tablet   Other Relevant Orders   Lipid panel (Completed)    Other Visit Diagnoses    Hypothyroidism, unspecified type    -  Primary   Relevant Orders   TSH (Completed)   Fungal infection of skin       Relevant Medications   clotrimazole-betamethasone (LOTRISONE) cream   Gastroesophageal reflux disease, esophagitis presence not specified       Relevant Medications   omeprazole (PRILOSEC) 20 MG capsule   dicyclomine (BENTYL) 10 MG capsule   Abdominal cramping       Relevant Medications   dicyclomine (BENTYL) 10 MG capsule      Follow-up: Return in about 6 months (around 06/08/2018), or if symptoms worsen or fail to improve, for annual exam, fasting.  Ann Held, DO

## 2017-12-08 NOTE — Assessment & Plan Note (Signed)
Diarrhea has stopped Add probiotic daily F/u in a few more days

## 2017-12-13 ENCOUNTER — Inpatient Hospital Stay: Payer: Medicaid Other

## 2017-12-13 ENCOUNTER — Inpatient Hospital Stay: Payer: Self-pay | Attending: Hematology & Oncology | Admitting: Hematology & Oncology

## 2017-12-13 VITALS — BP 166/80 | HR 61 | Temp 98.0°F | Resp 19 | Wt 234.1 lb

## 2017-12-13 DIAGNOSIS — C7951 Secondary malignant neoplasm of bone: Secondary | ICD-10-CM | POA: Insufficient documentation

## 2017-12-13 DIAGNOSIS — Z170 Estrogen receptor positive status [ER+]: Secondary | ICD-10-CM | POA: Insufficient documentation

## 2017-12-13 DIAGNOSIS — D471 Chronic myeloproliferative disease: Secondary | ICD-10-CM

## 2017-12-13 DIAGNOSIS — Z79811 Long term (current) use of aromatase inhibitors: Secondary | ICD-10-CM | POA: Insufficient documentation

## 2017-12-13 DIAGNOSIS — C50411 Malignant neoplasm of upper-outer quadrant of right female breast: Secondary | ICD-10-CM | POA: Insufficient documentation

## 2017-12-13 DIAGNOSIS — I10 Essential (primary) hypertension: Secondary | ICD-10-CM | POA: Insufficient documentation

## 2017-12-13 DIAGNOSIS — Z800 Family history of malignant neoplasm of digestive organs: Secondary | ICD-10-CM | POA: Insufficient documentation

## 2017-12-13 LAB — CMP (CANCER CENTER ONLY)
ALT: 15 U/L (ref 10–47)
ANION GAP: 1 — AB (ref 5–15)
AST: 18 U/L (ref 11–38)
Albumin: 3.8 g/dL (ref 3.5–5.0)
Alkaline Phosphatase: 126 U/L — ABNORMAL HIGH (ref 26–84)
BUN: 6 mg/dL — ABNORMAL LOW (ref 7–22)
CHLORIDE: 110 mmol/L — AB (ref 98–108)
CO2: 26 mmol/L (ref 18–33)
Calcium: 11 mg/dL — ABNORMAL HIGH (ref 8.0–10.3)
Creatinine: 0.6 mg/dL (ref 0.60–1.20)
GLUCOSE: 102 mg/dL (ref 73–118)
POTASSIUM: 3.8 mmol/L (ref 3.3–4.7)
Sodium: 137 mmol/L (ref 128–145)
TOTAL PROTEIN: 7.3 g/dL (ref 6.4–8.1)
Total Bilirubin: 0.9 mg/dL (ref 0.2–1.6)

## 2017-12-13 LAB — CBC WITH DIFFERENTIAL (CANCER CENTER ONLY)
Basophils Absolute: 0 10*3/uL (ref 0.0–0.1)
Basophils Relative: 0 %
EOS PCT: 1 %
Eosinophils Absolute: 0.1 10*3/uL (ref 0.0–0.5)
HCT: 41.3 % (ref 34.8–46.6)
Hemoglobin: 13.4 g/dL (ref 11.6–15.9)
LYMPHS ABS: 2.6 10*3/uL (ref 0.9–3.3)
LYMPHS PCT: 19 %
MCH: 30.1 pg (ref 26.0–34.0)
MCHC: 32.4 g/dL (ref 32.0–36.0)
MCV: 92.8 fL (ref 81.0–101.0)
MONO ABS: 0.8 10*3/uL (ref 0.1–0.9)
Monocytes Relative: 6 %
Neutro Abs: 10.2 10*3/uL — ABNORMAL HIGH (ref 1.5–6.5)
Neutrophils Relative %: 74 %
PLATELETS: 292 10*3/uL (ref 145–400)
RBC: 4.45 MIL/uL (ref 3.70–5.32)
RDW: 13 % (ref 11.1–15.7)
WBC Count: 13.7 10*3/uL — ABNORMAL HIGH (ref 3.9–10.0)

## 2017-12-13 LAB — LACTATE DEHYDROGENASE: LDH: 142 U/L (ref 98–192)

## 2017-12-13 NOTE — Progress Notes (Signed)
Hematology and Oncology Follow Up Visit  Brianna Fletcher [id number] 03-01-1971 47 y.o. 12/13/2017   Principle Diagnosis:  Chronic myeloproliferative syndrome-Triple negative Sub-clinical hypothyroidism (+) p-ANCA  Current Therapy:   Peg-Interferon q weekly - q 4 week dosing - On hold due to cost Synthroid 0.050 mg po q day   Interim History: Ms. Comes is here today for follow-up.  She is now a grandmother.  Her son and his girlfriend had a baby girl up in [place] on August 12.  Everything is doing quite well.  She is not sure when she will be able to physically go up to [place] to see her.  She is still bothered by the fact that she does not have much in the way of insurance.  She said that she is going to see her Cecille Rubin today.  She says the lower call and told her that a judge made a judgment on the situation that she has.  She seems to be tolerating her blood issue well.  She is not been on interferon for quite a while.  Her blood counts have been holding steady.  She does have arthralgias.  She cannot see a rheumatologist because of lack of insurance.  Her appetite is doing okay.  She is had no problems with bowels or bladder.  Apparently, she did have a bout of E. coli recently.  I am not sure how this was treated.  She was not hospitalized for this.  Her dog has MRSA.  She wants know she can hold the dog.  I told her that it should be okay if she held her dog.  Currently, her performance status is ECOG 1.   Medications:  Allergies as of 12/13/2017      Reactions   Amitiza [lubiprostone]    Made her sick   Lisinopril Cough   Losartan Cough      Medication List        Accurate as of 12/13/17 11:32 AM. Always use your most recent med list.          amLODipine 10 MG tablet Commonly known as:  NORVASC Take 1 tablet (10 mg total) by mouth daily.   aspirin 81 MG tablet Take 81 mg by mouth daily.   clotrimazole-betamethasone cream Commonly known as:   LOTRISONE APPLY 1 APPLICATION TOPICALLY 2 TIMES A DAY.   dicyclomine 10 MG capsule Commonly known as:  BENTYL Take 1 capsule (10 mg total) by mouth 4 (four) times daily -  before meals and at bedtime.   doxepin 25 MG capsule Commonly known as:  SINEQUAN Take 2 capsules (50 mg total) by mouth at bedtime.   furosemide 20 MG tablet Commonly known as:  LASIX Take 1 tablet (20 mg total) by mouth daily. NEEDS OV   KLOR-CON M20 20 MEQ tablet Generic drug:  potassium chloride SA Take 20 mEq by mouth 2 (two) times daily.   levothyroxine 50 MCG tablet Commonly known as:  SYNTHROID, LEVOTHROID TAKE 1 TABLET (50 MCG TOTAL) DAILY BEFORE BREAKFAST BY MOUTH.   omeprazole 20 MG capsule Commonly known as:  PRILOSEC TAKE 1 CAPSULE BY MOUTH EVERY DAY.   PEGASYS 180 MCG/ML injection Generic drug:  peginterferon alfa-2a Inject 0.45 mLs (81 mcg total) into the skin every 14 (fourteen) days.       Allergies:  Allergies  Allergen Reactions  . Amitiza [Lubiprostone]     Made her sick  . Lisinopril Cough  . Losartan Cough    Past  Medical History, Surgical history, Social history, and Family History were reviewed and updated.  Review of Systems: Review of Systems  Constitutional: Positive for malaise/fatigue.  HENT: Negative.   Eyes: Negative.   Respiratory: Negative.   Cardiovascular: Negative.   Gastrointestinal: Positive for diarrhea.  Genitourinary: Negative.   Musculoskeletal: Positive for myalgias.  Skin: Negative.   Neurological: Positive for dizziness.  Psychiatric/Behavioral: Negative.      Physical Exam:  weight is 234 lb 1.9 oz (106.2 kg). Her oral temperature is 98 F (36.7 C). Her blood pressure is 166/80 (abnormal) and her pulse is 61. Her respiration is 19 and oxygen saturation is 100%.   Wt Readings from Last 3 Encounters:  12/13/17 234 lb 1.9 oz (106.2 kg)  12/08/17 231 lb 9.6 oz (105.1 kg)  12/01/17 230 lb (104.3 kg)    Physical Exam  Constitutional: She  is oriented to person, place, and time.  HENT:  Head: Normocephalic and atraumatic.  Mouth/Throat: Oropharynx is clear and moist.  Eyes: Pupils are equal, round, and reactive to light. EOM are normal.  Neck: Normal range of motion.  Cardiovascular: Normal rate, regular rhythm and normal heart sounds.  Pulmonary/Chest: Effort normal and breath sounds normal.  Abdominal: Soft. Bowel sounds are normal.  Musculoskeletal: Normal range of motion. She exhibits no edema, tenderness or deformity.  Lymphadenopathy:    She has no cervical adenopathy.  Neurological: She is alert and oriented to person, place, and time.  Skin: Skin is warm and dry. No rash noted. No erythema.  Psychiatric: She has a normal mood and affect. Her behavior is normal. Judgment and thought content normal.  Vitals reviewed.    Lab Results  Component Value Date   WBC 13.7 (H) 12/13/2017   HGB 13.4 12/13/2017   HCT 41.3 12/13/2017   MCV 92.8 12/13/2017   PLT 292 12/13/2017   Lab Results  Component Value Date   FERRITIN 50 08/23/2017   IRON 98 08/23/2017   TIBC 328 08/23/2017   UIBC 230 08/23/2017   IRONPCTSAT 30 08/23/2017   Lab Results  Component Value Date   RETICCTPCT 2.1 04/12/2017   RBC 4.45 12/13/2017   RETICCTABS 116.1 11/20/2013   No results found for: KPAFRELGTCHN, LAMBDASER, KAPLAMBRATIO No results found for: IGGSERUM, IGA, IGMSERUM No results found for: Kathrynn Ducking, MSPIKE, SPEI   Chemistry      Component Value Date/Time   NA 137 12/13/2017 1012   NA 140 02/08/2017 0925   NA 136 03/10/2016 1147   K 3.8 12/13/2017 1012   K 3.4 02/08/2017 0925   K 3.9 03/10/2016 1147   CL 110 (H) 12/13/2017 1012   CL 105 02/08/2017 0925   CO2 26 12/13/2017 1012   CO2 26 02/08/2017 0925   CO2 24 03/10/2016 1147   BUN 6 (L) 12/13/2017 1012   BUN 10 02/08/2017 0925   BUN 10.2 03/10/2016 1147   CREATININE 0.60 12/13/2017 1012   CREATININE 0.7 02/08/2017 0925     CREATININE 0.7 03/10/2016 1147      Component Value Date/Time   CALCIUM 11.0 (H) 12/13/2017 1012   CALCIUM 10.4 (H) 02/08/2017 0925   CALCIUM 10.7 (H) 03/10/2016 1147   ALKPHOS 126 (H) 12/13/2017 1012   ALKPHOS 106 (H) 02/08/2017 0925   ALKPHOS 132 03/10/2016 1147   AST 18 12/13/2017 1012   AST 12 03/10/2016 1147   ALT 15 12/13/2017 1012   ALT 16 02/08/2017 0925   ALT 8 03/10/2016 1147  BILITOT 0.9 12/13/2017 1012   BILITOT 0.63 03/10/2016 1147      Impression and Plan: Ms. Pedro is a very pleasant 47 yo caucasian female with a triple negative myeloproliferative syndrome though all work up has been negative so far.   From my point of view, everything looks great.  Her blood looks fine under the microscope.  We will plan to see her back in about 6 weeks.  I think this would be reasonable.  [name], MD 9/24/201911:32 AM

## 2017-12-13 NOTE — Telephone Encounter (Signed)
Incorrect encounter

## 2017-12-14 NOTE — Addendum Note (Signed)
Addended byDamita Dunnings D on: 12/14/2017 11:34 AM   Modules accepted: Orders

## 2017-12-24 ENCOUNTER — Other Ambulatory Visit: Payer: Self-pay | Admitting: Family Medicine

## 2017-12-24 DIAGNOSIS — R600 Localized edema: Secondary | ICD-10-CM

## 2018-01-01 ENCOUNTER — Other Ambulatory Visit: Payer: Self-pay | Admitting: Family Medicine

## 2018-01-01 DIAGNOSIS — K219 Gastro-esophageal reflux disease without esophagitis: Secondary | ICD-10-CM

## 2018-01-24 ENCOUNTER — Other Ambulatory Visit: Payer: Medicaid Other

## 2018-01-24 ENCOUNTER — Ambulatory Visit: Payer: Medicaid Other | Admitting: Hematology & Oncology

## 2018-01-25 ENCOUNTER — Other Ambulatory Visit: Payer: Self-pay | Admitting: Family Medicine

## 2018-01-25 DIAGNOSIS — K219 Gastro-esophageal reflux disease without esophagitis: Secondary | ICD-10-CM

## 2018-01-25 DIAGNOSIS — R600 Localized edema: Secondary | ICD-10-CM

## 2018-03-02 ENCOUNTER — Inpatient Hospital Stay (HOSPITAL_BASED_OUTPATIENT_CLINIC_OR_DEPARTMENT_OTHER): Payer: Medicare Other | Admitting: Hematology & Oncology

## 2018-03-02 ENCOUNTER — Inpatient Hospital Stay: Payer: Medicare Other | Attending: Hematology & Oncology

## 2018-03-02 ENCOUNTER — Other Ambulatory Visit: Payer: Self-pay

## 2018-03-02 ENCOUNTER — Encounter: Payer: Self-pay | Admitting: Hematology & Oncology

## 2018-03-02 ENCOUNTER — Telehealth: Payer: Self-pay | Admitting: Hematology & Oncology

## 2018-03-02 VITALS — BP 136/69 | HR 63 | Temp 98.1°F | Resp 18 | Wt 238.2 lb

## 2018-03-02 DIAGNOSIS — D471 Chronic myeloproliferative disease: Secondary | ICD-10-CM

## 2018-03-02 DIAGNOSIS — E039 Hypothyroidism, unspecified: Secondary | ICD-10-CM | POA: Insufficient documentation

## 2018-03-02 DIAGNOSIS — Z79899 Other long term (current) drug therapy: Secondary | ICD-10-CM | POA: Diagnosis not present

## 2018-03-02 DIAGNOSIS — E032 Hypothyroidism due to medicaments and other exogenous substances: Secondary | ICD-10-CM

## 2018-03-02 LAB — CBC WITH DIFFERENTIAL (CANCER CENTER ONLY)
ABS IMMATURE GRANULOCYTES: 0.27 10*3/uL — AB (ref 0.00–0.07)
Basophils Absolute: 0.1 10*3/uL (ref 0.0–0.1)
Basophils Relative: 1 %
EOS ABS: 0.1 10*3/uL (ref 0.0–0.5)
EOS PCT: 1 %
HEMATOCRIT: 43.5 % (ref 36.0–46.0)
Hemoglobin: 13.7 g/dL (ref 12.0–15.0)
Immature Granulocytes: 2 %
Lymphocytes Relative: 18 %
Lymphs Abs: 2.7 10*3/uL (ref 0.7–4.0)
MCH: 29.5 pg (ref 26.0–34.0)
MCHC: 31.5 g/dL (ref 30.0–36.0)
MCV: 93.8 fL (ref 80.0–100.0)
MONO ABS: 0.8 10*3/uL (ref 0.1–1.0)
Monocytes Relative: 5 %
NEUTROS ABS: 11.1 10*3/uL — AB (ref 1.7–7.7)
NEUTROS PCT: 73 %
Platelet Count: 340 10*3/uL (ref 150–400)
RBC: 4.64 MIL/uL (ref 3.87–5.11)
RDW: 12.7 % (ref 11.5–15.5)
WBC Count: 15.1 10*3/uL — ABNORMAL HIGH (ref 4.0–10.5)
nRBC: 0 % (ref 0.0–0.2)

## 2018-03-02 LAB — CMP (CANCER CENTER ONLY)
ALT: 6 U/L (ref 0–44)
AST: 9 U/L — AB (ref 15–41)
Albumin: 4.5 g/dL (ref 3.5–5.0)
Alkaline Phosphatase: 127 U/L — ABNORMAL HIGH (ref 38–126)
Anion gap: 8 (ref 5–15)
BUN: 12 mg/dL (ref 6–20)
CO2: 27 mmol/L (ref 22–32)
Calcium: 10.7 mg/dL — ABNORMAL HIGH (ref 8.9–10.3)
Chloride: 102 mmol/L (ref 98–111)
Creatinine: 0.55 mg/dL (ref 0.44–1.00)
GFR, Est AFR Am: >60 mL/min (ref >60)
GFR, Estimated: >60 mL/min (ref >60)
GLUCOSE: 92 mg/dL (ref 70–99)
POTASSIUM: 3.6 mmol/L (ref 3.5–5.1)
SODIUM: 137 mmol/L (ref 135–145)
Total Bilirubin: 0.6 mg/dL (ref 0.3–1.2)
Total Protein: 7.3 g/dL (ref 6.5–8.1)

## 2018-03-02 LAB — LACTATE DEHYDROGENASE: LDH: 172 U/L (ref 98–192)

## 2018-03-02 LAB — SAVE SMEAR

## 2018-03-02 NOTE — Progress Notes (Signed)
Hematology and Oncology Follow Up Visit  Brianna Fletcher [id number] 1970-12-29 47 y.o. 03/02/2018   Principle Diagnosis:  Chronic myeloproliferative syndrome-Triple negative Sub-clinical hypothyroidism (+) p-ANCA  Current Therapy:   Peg-Interferon q weekly - q 4 week dosing - On hold due to cost Synthroid 0.050 mg po q day   Interim History: Brianna Fletcher is here today for follow-up.  She actually is doing quite well.  She had a wonderful Thanksgiving.  Her son, girlfriend and their new baby girl came down from [place].  They really had a wonderful time.  She gave me a Christmas card with everybody's picture on it.  It is a really nice picture.  The baby is very cute.  She adds he was here 3 months ago.  She is doing well.  She is off the interferon.  Her white cell count is holding steady.  She has had no problems with rashes.  She said that she has had some colds.  She said that she is had 3 colds already this year.  She now has disability.  This is a big "lift off her shoulder."  She has had no change in bowel or bladder habits.  There is been no diarrhea.  She has had no leg swelling.  Her rheumatism is about the same.  Her hypothyroidism is also about the same.    Currently, her performance status is ECOG 1.     Medications:  Allergies as of 03/02/2018      Reactions   Lisinopril Cough, Other (See Comments)   Losartan Cough, Other (See Comments)   Lubiprostone Other (See Comments)   Made her sick Made her sick      Medication List       Accurate as of March 02, 2018  1:09 PM. Always use your most recent med list.        amLODipine 10 MG tablet Commonly known as:  NORVASC Take 1 tablet (10 mg total) by mouth daily.   aspirin 81 MG tablet Take 81 mg by mouth daily.   clotrimazole-betamethasone cream Commonly known as:  LOTRISONE APPLY 1 APPLICATION TOPICALLY 2 TIMES A DAY.   dicyclomine 10 MG capsule Commonly known as:  BENTYL Take 1 capsule (10  mg total) by mouth 4 (four) times daily -  before meals and at bedtime.   doxepin 25 MG capsule Commonly known as:  SINEQUAN Take 2 capsules (50 mg total) by mouth at bedtime.   furosemide 20 MG tablet Commonly known as:  LASIX Take 1 tablet (20 mg total) by mouth daily.   KLOR-CON M20 20 MEQ tablet Generic drug:  potassium chloride SA Take 20 mEq by mouth 2 (two) times daily.   levothyroxine 50 MCG tablet Commonly known as:  SYNTHROID, LEVOTHROID TAKE 1 TABLET (50 MCG TOTAL) DAILY BEFORE BREAKFAST BY MOUTH.   omeprazole 20 MG capsule Commonly known as:  PRILOSEC Take 1 capsule (20 mg total) by mouth daily.   PEGASYS 180 MCG/ML injection Generic drug:  peginterferon alfa-2a Inject 0.45 mLs (81 mcg total) into the skin every 14 (fourteen) days.       Allergies:  Allergies  Allergen Reactions  . Lisinopril Cough and Other (See Comments)  . Losartan Cough and Other (See Comments)  . Lubiprostone Other (See Comments)    Made her sick Made her sick    Past Medical History, Surgical history, Social history, and Family History were reviewed and updated.  Review of Systems: Review of Systems  Constitutional:  Positive for malaise/fatigue.  HENT: Negative.   Eyes: Negative.   Respiratory: Negative.   Cardiovascular: Negative.   Gastrointestinal: Positive for diarrhea.  Genitourinary: Negative.   Musculoskeletal: Positive for myalgias.  Skin: Negative.   Neurological: Positive for dizziness.  Psychiatric/Behavioral: Negative.      Physical Exam:  weight is 238 lb 4 oz (108.1 kg). Her oral temperature is 98.1 F (36.7 C). Her blood pressure is 136/69 and her pulse is 63. Her respiration is 18 and oxygen saturation is 100%.   Wt Readings from Last 3 Encounters:  03/02/18 238 lb 4 oz (108.1 kg)  12/13/17 234 lb 1.9 oz (106.2 kg)  12/08/17 231 lb 9.6 oz (105.1 kg)    Physical Exam Vitals signs reviewed.  HENT:     Head: Normocephalic and atraumatic.  Eyes:      Pupils: Pupils are equal, round, and reactive to light.  Neck:     Musculoskeletal: Normal range of motion.  Cardiovascular:     Rate and Rhythm: Normal rate and regular rhythm.     Heart sounds: Normal heart sounds.  Pulmonary:     Effort: Pulmonary effort is normal.     Breath sounds: Normal breath sounds.  Abdominal:     General: Bowel sounds are normal.     Palpations: Abdomen is soft.  Musculoskeletal: Normal range of motion.        General: No tenderness or deformity.  Lymphadenopathy:     Cervical: No cervical adenopathy.  Skin:    General: Skin is warm and dry.     Findings: No erythema or rash.  Neurological:     Mental Status: She is alert and oriented to person, place, and time.  Psychiatric:        Behavior: Behavior normal.        Thought Content: Thought content normal.        Judgment: Judgment normal.      Lab Results  Component Value Date   WBC 15.1 (H) 03/02/2018   HGB 13.7 03/02/2018   HCT 43.5 03/02/2018   MCV 93.8 03/02/2018   PLT 340 03/02/2018   Lab Results  Component Value Date   FERRITIN 50 08/23/2017   IRON 98 08/23/2017   TIBC 328 08/23/2017   UIBC 230 08/23/2017   IRONPCTSAT 30 08/23/2017   Lab Results  Component Value Date   RETICCTPCT 2.1 04/12/2017   RBC 4.64 03/02/2018   RETICCTABS 116.1 11/20/2013   No results found for: KPAFRELGTCHN, LAMBDASER, KAPLAMBRATIO No results found for: IGGSERUM, IGA, IGMSERUM No results found for: Odetta Pink, SPEI   Chemistry      Component Value Date/Time   NA 137 12/13/2017 1012   NA 140 02/08/2017 0925   NA 136 03/10/2016 1147   K 3.8 12/13/2017 1012   K 3.4 02/08/2017 0925   K 3.9 03/10/2016 1147   CL 110 (H) 12/13/2017 1012   CL 105 02/08/2017 0925   CO2 26 12/13/2017 1012   CO2 26 02/08/2017 0925   CO2 24 03/10/2016 1147   BUN 6 (L) 12/13/2017 1012   BUN 10 02/08/2017 0925   BUN 10.2 03/10/2016 1147   CREATININE 0.60  12/13/2017 1012   CREATININE 0.7 02/08/2017 0925   CREATININE 0.7 03/10/2016 1147      Component Value Date/Time   CALCIUM 11.0 (H) 12/13/2017 1012   CALCIUM 10.4 (H) 02/08/2017 0925   CALCIUM 10.7 (H) 03/10/2016 1147   ALKPHOS 126 (H) 12/13/2017  1012   ALKPHOS 106 (H) 02/08/2017 0925   ALKPHOS 132 03/10/2016 1147   AST 18 12/13/2017 1012   AST 12 03/10/2016 1147   ALT 15 12/13/2017 1012   ALT 16 02/08/2017 0925   ALT 8 03/10/2016 1147   BILITOT 0.9 12/13/2017 1012   BILITOT 0.63 03/10/2016 1147      Impression and Plan: Brianna Fletcher is a very pleasant 47 yo caucasian female with a triple negative myeloproliferative syndrome though all work up has been negative so far.   From my point of view, everything looks great.  Her blood looks fine under the microscope.  We will plan to see her back in about 8 weeks.  I think this would be reasonable.  [name], MD 12/12/20191:09 PM

## 2018-03-02 NOTE — Telephone Encounter (Signed)
Appointments scheduled letter/calendar mailed per 12/11 los

## 2018-04-07 ENCOUNTER — Ambulatory Visit (INDEPENDENT_AMBULATORY_CARE_PROVIDER_SITE_OTHER): Payer: Medicare Other | Admitting: Family Medicine

## 2018-04-07 ENCOUNTER — Encounter: Payer: Self-pay | Admitting: Family Medicine

## 2018-04-07 ENCOUNTER — Ambulatory Visit (HOSPITAL_BASED_OUTPATIENT_CLINIC_OR_DEPARTMENT_OTHER)
Admission: RE | Admit: 2018-04-07 | Discharge: 2018-04-07 | Disposition: A | Discharge status: Home / Self Care | Payer: Medicare Other | Source: Ambulatory Visit | Attending: Family Medicine | Admitting: Family Medicine

## 2018-04-07 VITALS — BP 128/80 | HR 61 | Temp 97.9°F | Resp 18 | Ht 66.0 in | Wt 242.0 lb

## 2018-04-07 DIAGNOSIS — J029 Acute pharyngitis, unspecified: Secondary | ICD-10-CM

## 2018-04-07 DIAGNOSIS — R6889 Other general symptoms and signs: Secondary | ICD-10-CM

## 2018-04-07 DIAGNOSIS — J40 Bronchitis, not specified as acute or chronic: Secondary | ICD-10-CM | POA: Diagnosis not present

## 2018-04-07 DIAGNOSIS — R05 Cough: Secondary | ICD-10-CM | POA: Diagnosis not present

## 2018-04-07 LAB — POC INFLUENZA A&B (BINAX/QUICKVUE)
INFLUENZA A, POC: NEGATIVE
INFLUENZA B, POC: NEGATIVE

## 2018-04-07 LAB — POCT RAPID STREP A (OFFICE): Rapid Strep A Screen: NEGATIVE

## 2018-04-07 MED ORDER — PROMETHAZINE-DM 6.25-15 MG/5ML PO SYRP: 5.0000 mL | ORAL_SOLUTION | Freq: Four times a day (QID) | ORAL | 0 refills | Status: DC | PRN

## 2018-04-07 MED ORDER — AZITHROMYCIN 250 MG PO TABS: ORAL_TABLET | ORAL | 0 refills | Status: DC

## 2018-04-07 NOTE — Patient Instructions (Signed)

## 2018-04-07 NOTE — Progress Notes (Signed)
Patient ID: Brianna Fletcher, female    DOB: 09/06/1970  Age: 48 y.o. MRN: [id number]    Subjective:  Subjective  HPI CHIANTI GOH presents for cough and congestion x >1 week =----- + fever and prod cough  + chest pain   Review of Systems  Constitutional: Positive for fatigue and fever. Negative for appetite change, diaphoresis and unexpected weight change.  Eyes: Negative for pain, redness and visual disturbance.  Respiratory: Positive for cough and shortness of breath. Negative for chest tightness and wheezing.   Cardiovascular: Positive for chest pain. Negative for palpitations and leg swelling.  Endocrine: Negative for cold intolerance, heat intolerance, polydipsia, polyphagia and polyuria.  Genitourinary: Negative for difficulty urinating, dysuria and frequency.  Neurological: Negative for dizziness, light-headedness, numbness and headaches.    History Past Medical History:  Diagnosis Date  . Arthritis   . Blood dyscrasia    produces too many WBCs  . Chronic neutrophilia 11/15/2012  . Family history of heart disease    mother had stents placed in her 51s and had myocardial infarctions  . Glaucoma   . Hypertension   . Leukocytosis, unspecified 11/15/2012  . Monocytosis 11/15/2012  . Myeloproliferative disorder ([place]) 03/08/2013  . Precordial chest pain   . Pruritus 11/15/2012  . Radial artery occlusion, right (HCC)    status post  right radial artery cardiac catheterization  . Vaginal delivery 1986, 2000    She has a past surgical history that includes Tubal ligation (2000); Eye surgery; Cardiac catheterization (N/A, 10/03/2014); Dilatation & currettage/hysteroscopy with novasure ablation (N/A, 11/08/2014); and Laparoscopic vaginal hysterectomy with salpingectomy (Bilateral, 03/05/2015).   Her family history includes Cancer in her father; Dementia in her maternal grandmother; Diabetes in her father; Heart disease in her cousin, maternal grandmother, and mother; Heart disease  (age of onset: 43) in her maternal grandfather; Hyperlipidemia in her mother; Hypertension in her mother.She reports that she has quit smoking. Her smoking use included cigarettes. She started smoking about 3 years ago. She has never used smokeless tobacco. She reports current alcohol use of about 6.0 standard drinks of alcohol per week. She reports that she does not use drugs.  Current Outpatient Medications on File Prior to Visit  Medication Sig Dispense Refill  . amLODipine (NORVASC) 10 MG tablet Take 1 tablet (10 mg total) by mouth daily. 30 tablet 5  . aspirin 81 MG tablet Take 81 mg by mouth daily.    . clotrimazole-betamethasone (LOTRISONE) cream APPLY 1 APPLICATION TOPICALLY 2 TIMES A DAY. 30 g 0  . dicyclomine (BENTYL) 10 MG capsule Take 1 capsule (10 mg total) by mouth 4 (four) times daily -  before meals and at bedtime. 40 capsule 0  . doxepin (SINEQUAN) 25 MG capsule Take 2 capsules (50 mg total) by mouth at bedtime. 60 capsule 3  . furosemide (LASIX) 20 MG tablet Take 1 tablet (20 mg total) by mouth daily. 90 tablet 1  . KLOR-CON M20 20 MEQ tablet Take 20 mEq by mouth 2 (two) times daily.  1  . levothyroxine (SYNTHROID, LEVOTHROID) 50 MCG tablet TAKE 1 TABLET (50 MCG TOTAL) DAILY BEFORE BREAKFAST BY MOUTH. 90 tablet 1  . omeprazole (PRILOSEC) 20 MG capsule Take 1 capsule (20 mg total) by mouth daily. 90 capsule 3  . PEGASYS 180 MCG/ML injection Inject 0.45 mLs (81 mcg total) into the skin every 14 (fourteen) days. 2 mL 3   Current Facility-Administered Medications on File Prior to Visit  Medication Dose Route Frequency Provider Last  Rate Last Dose  . acetaminophen (TYLENOL) tablet 650 mg  650 mg Oral Once [name], MD      . peginterferon alfa-2a (PEGASYS) injection 81 mcg  81 mcg Subcutaneous Weekly [name], MD   81 mcg at 04/26/14 1530     Objective:  Objective  Physical Exam Constitutional:      Appearance: She is well-developed.  HENT:     Right Ear:  External ear normal.     Left Ear: External ear normal.  Eyes:     General:        Right eye: No discharge.        Left eye: No discharge.     Conjunctiva/sclera: Conjunctivae normal.  Cardiovascular:     Rate and Rhythm: Normal rate and regular rhythm.     Heart sounds: Normal heart sounds. No murmur.  Pulmonary:     Effort: Pulmonary effort is normal. No respiratory distress.     Breath sounds: Decreased breath sounds and rhonchi present. No wheezing or rales.  Chest:     Chest wall: No tenderness.  Lymphadenopathy:     Cervical: Cervical adenopathy present.  Neurological:     Mental Status: She is alert and oriented to person, place, and time.    BP 128/80 (BP Location: Left Arm, Patient Position: Sitting, Cuff Size: Large)   Pulse 61   Temp 97.9 F (36.6 C) (Oral)   Resp 18   Ht 5\' 6"  (1.676 m)   Wt 242 lb (109.8 kg)   SpO2 97%   BMI 39.06 kg/m  Wt Readings from Last 3 Encounters:  04/07/18 242 lb (109.8 kg)  03/02/18 238 lb 4 oz (108.1 kg)  12/13/17 234 lb 1.9 oz (106.2 kg)     Lab Results  Component Value Date   WBC 15.1 (H) 03/02/2018   HGB 13.7 03/02/2018   HCT 43.5 03/02/2018   PLT 340 03/02/2018   GLUCOSE 92 03/02/2018   CHOL 188 12/08/2017   TRIG 234.0 (H) 12/08/2017   HDL 44.70 12/08/2017   LDLDIRECT 120.0 12/08/2017   LDLCALC 112 (H) 12/09/2016   ALT 6 03/02/2018   AST 9 (L) 03/02/2018   NA 137 03/02/2018   K 3.6 03/02/2018   CL 102 03/02/2018   CREATININE 0.55 03/02/2018   BUN 12 03/02/2018   CO2 27 03/02/2018   TSH 1.19 12/08/2017   INR 0.9 05/06/2016   MICROALBUR <0.7 04/23/2016    No results found.   Assessment & Plan:  Plan  I am having [name] start on azithromycin and promethazine-dextromethorphan. I am also having her maintain her aspirin, doxepin, PEGASYS, KLOR-CON M20, levothyroxine, clotrimazole-betamethasone, amLODipine, dicyclomine, omeprazole, and furosemide.  Meds ordered this encounter  Medications  .  azithromycin (ZITHROMAX Z-PAK) 250 MG tablet    Sig: As directed    Dispense:  6 each    Refill:  0  . promethazine-dextromethorphan (PROMETHAZINE-DM) 6.25-15 MG/5ML syrup    Sig: Take 5 mLs by mouth 4 (four) times daily as needed.    Dispense:  118 mL    Refill:  0    Problem List Items Addressed This Visit    None    Visit Diagnoses    Bronchitis    -  Primary   Relevant Medications   azithromycin (ZITHROMAX Z-PAK) 250 MG tablet   promethazine-dextromethorphan (PROMETHAZINE-DM) 6.25-15 MG/5ML syrup   Other Relevant Orders   DG Chest 2 View (Completed)   Flu-like symptoms  Relevant Orders   POC Influenza A&B (Binax test) (Completed)   Sore throat       Relevant Orders   POCT rapid strep A (Completed)      Follow-up: Return in about 2 weeks (around 04/21/2018), or if symptoms worsen or fail to improve.  Ann Held, DO

## 2018-04-10 ENCOUNTER — Telehealth: Payer: Self-pay | Admitting: Family Medicine

## 2018-04-10 ENCOUNTER — Encounter: Payer: Self-pay | Admitting: Family Medicine

## 2018-04-10 DIAGNOSIS — J40 Bronchitis, not specified as acute or chronic: Secondary | ICD-10-CM

## 2018-04-10 NOTE — Telephone Encounter (Signed)
Change abx to levaquin 500 mg #10 1 po qd  Phenergan dm 1-2 tsp po qhs prn

## 2018-04-10 NOTE — Telephone Encounter (Signed)
Copied from [place]. Topic: Quick Communication - See Telephone Encounter >> Apr 10, 2018  9:27 AM Burchel, Abbi R wrote: CRM for notification. See Telephone encounter for: 04/10/18.  Pt was unsure if she should make a f/up appt.  She is feeling a little better.  She is still coughing, but her sore throat has improved. Please advise.

## 2018-04-11 MED ORDER — LEVOFLOXACIN 500 MG PO TABS: 500.0000 mg | ORAL_TABLET | Freq: Every day | ORAL | 0 refills | Status: DC

## 2018-04-11 MED ORDER — PROMETHAZINE-DM 6.25-15 MG/5ML PO SYRP: ORAL_SOLUTION | ORAL | 0 refills | Status: DC

## 2018-04-11 NOTE — Telephone Encounter (Signed)
Patient is calling back in returning call

## 2018-04-11 NOTE — Telephone Encounter (Signed)
Patient is calling in for update

## 2018-04-11 NOTE — Telephone Encounter (Signed)
If she is 100% better she does not need to come in

## 2018-04-11 NOTE — Telephone Encounter (Signed)
You had stated in your plan to follow up in 2 weeks.  Do you still want her to follow up in 2 weeks if she is feeling better?

## 2018-04-11 NOTE — Telephone Encounter (Signed)
Left message on machine that she does not need to come back in if 100% better.  If not then she needs to follow up 2 weeks from her last ov

## 2018-04-12 NOTE — Telephone Encounter (Signed)
See communication in 04/10/18 mychart message.

## 2018-04-13 ENCOUNTER — Encounter: Payer: Self-pay | Admitting: Family Medicine

## 2018-04-13 ENCOUNTER — Ambulatory Visit (INDEPENDENT_AMBULATORY_CARE_PROVIDER_SITE_OTHER): Payer: Medicare Other | Admitting: Family Medicine

## 2018-04-13 ENCOUNTER — Telehealth: Payer: Self-pay | Admitting: Family Medicine

## 2018-04-13 VITALS — BP 108/68 | HR 84 | Temp 98.0°F | Resp 16 | Ht 66.0 in | Wt 243.4 lb

## 2018-04-13 DIAGNOSIS — J189 Pneumonia, unspecified organism: Secondary | ICD-10-CM

## 2018-04-13 MED ORDER — LEVOFLOXACIN 500 MG PO TABS: 500.0000 mg | ORAL_TABLET | Freq: Every day | ORAL | 0 refills | Status: DC

## 2018-04-13 MED ORDER — HYDROCOD POLST-CPM POLST ER 10-8 MG/5ML PO SUER: 5.0000 mL | Freq: Every evening | ORAL | 0 refills | Status: DC | PRN

## 2018-04-13 MED FILL — levoFLOXacin 500 MG TABS: 500 | 7 days supply | Qty: 7 | Fill #0

## 2018-04-13 NOTE — Telephone Encounter (Signed)
Copied from [place] [id number]. Topic: Quick Communication - Rx Refill/Question >> Apr 13, 2018  3:51 PM Sheran Luz wrote: Medication: chlorpheniramine-HYDROcodone (TUSSIONEX PENNKINETIC ER) 10-8 MG/5ML SUER   Patient calling to state that this medication is 90$ and she cannot afford it. Patient would like to know if there is an alternative that could be sent in asap.  Patient is requesting this be sent before pharmacy closes at 6:00pm. Please advise.   Preferred Pharmacy (with phone number or street name):[facility] [place], [place] - [address] [phone] (Phone) [phone] (Fax)

## 2018-04-13 NOTE — Progress Notes (Signed)
Patient ID: Brianna Fletcher, female    DOB: 07-Jan-1971  Age: 48 y.o. MRN: [id number]    Subjective:  Subjective  HPI Brianna Fletcher presents for f/u pneumonia and cough   No more fever  Review of Systems  Constitutional: Negative for appetite change, diaphoresis, fatigue and unexpected weight change.  Eyes: Negative for pain, redness and visual disturbance.  Respiratory: Positive for cough and wheezing. Negative for chest tightness and shortness of breath.   Cardiovascular: Negative for chest pain, palpitations and leg swelling.  Endocrine: Negative for cold intolerance, heat intolerance, polydipsia, polyphagia and polyuria.  Genitourinary: Negative for difficulty urinating, dysuria and frequency.  Neurological: Negative for dizziness, light-headedness, numbness and headaches.    History Past Medical History:  Diagnosis Date  . Arthritis   . Blood dyscrasia    produces too many WBCs  . Chronic neutrophilia 11/15/2012  . Family history of heart disease    mother had stents placed in her 57s and had myocardial infarctions  . Glaucoma   . Hypertension   . Leukocytosis, unspecified 11/15/2012  . Monocytosis 11/15/2012  . Myeloproliferative disorder ([place]) 03/08/2013  . Precordial chest pain   . Pruritus 11/15/2012  . Radial artery occlusion, right (HCC)    status post  right radial artery cardiac catheterization  . Vaginal delivery 1986, 2000    She has a past surgical history that includes Tubal ligation (2000); Eye surgery; Cardiac catheterization (N/A, 10/03/2014); Dilatation & currettage/hysteroscopy with novasure ablation (N/A, 11/08/2014); and Laparoscopic vaginal hysterectomy with salpingectomy (Bilateral, 03/05/2015).   Her family history includes Cancer in her father; Dementia in her maternal grandmother; Diabetes in her father; Heart disease in her cousin, maternal grandmother, and mother; Heart disease (age of onset: 61) in her maternal grandfather; Hyperlipidemia in her  mother; Hypertension in her mother.She reports that she has quit smoking. Her smoking use included cigarettes. She started smoking about 3 years ago. She has never used smokeless tobacco. She reports current alcohol use of about 6.0 standard drinks of alcohol per week. She reports that she does not use drugs.  Current Outpatient Medications on File Prior to Visit  Medication Sig Dispense Refill  . amLODipine (NORVASC) 10 MG tablet Take 1 tablet (10 mg total) by mouth daily. 30 tablet 5  . aspirin 81 MG tablet Take 81 mg by mouth daily.    . clotrimazole-betamethasone (LOTRISONE) cream APPLY 1 APPLICATION TOPICALLY 2 TIMES A DAY. 30 g 0  . dicyclomine (BENTYL) 10 MG capsule Take 1 capsule (10 mg total) by mouth 4 (four) times daily -  before meals and at bedtime. 40 capsule 0  . doxepin (SINEQUAN) 25 MG capsule Take 2 capsules (50 mg total) by mouth at bedtime. 60 capsule 3  . furosemide (LASIX) 20 MG tablet Take 1 tablet (20 mg total) by mouth daily. 90 tablet 1  . KLOR-CON M20 20 MEQ tablet Take 20 mEq by mouth 2 (two) times daily.  1  . levothyroxine (SYNTHROID, LEVOTHROID) 50 MCG tablet TAKE 1 TABLET (50 MCG TOTAL) DAILY BEFORE BREAKFAST BY MOUTH. 90 tablet 1  . omeprazole (PRILOSEC) 20 MG capsule Take 1 capsule (20 mg total) by mouth daily. 90 capsule 3  . PEGASYS 180 MCG/ML injection Inject 0.45 mLs (81 mcg total) into the skin every 14 (fourteen) days. 2 mL 3  . levofloxacin (LEVAQUIN) 500 MG tablet Take 1 tablet (500 mg total) by mouth daily. (Patient not taking: Reported on 04/13/2018) 10 tablet 0  . promethazine-dextromethorphan (PROMETHAZINE-DM) 6.25-15 MG/5ML  syrup Take 1 to 2 teaspoonfuls at bedtime as needed (Patient not taking: Reported on 04/13/2018) 120 mL 0   Current Facility-Administered Medications on File Prior to Visit  Medication Dose Route Frequency Provider Last Rate Last Dose  . acetaminophen (TYLENOL) tablet 650 mg  650 mg Oral Once [name], MD      .  peginterferon alfa-2a (PEGASYS) injection 81 mcg  81 mcg Subcutaneous Weekly [name], MD   81 mcg at 04/26/14 1530     Objective:  Objective  Physical Exam Vitals signs and nursing note reviewed.  Constitutional:      Appearance: She is well-developed.  HENT:     Head: Normocephalic and atraumatic.  Eyes:     Conjunctiva/sclera: Conjunctivae normal.  Neck:     Musculoskeletal: Normal range of motion and neck supple.     Thyroid: No thyromegaly.     Vascular: No carotid bruit or JVD.  Cardiovascular:     Rate and Rhythm: Normal rate and regular rhythm.     Heart sounds: Normal heart sounds. No murmur.  Pulmonary:     Effort: Pulmonary effort is normal. No respiratory distress.     Breath sounds: Rhonchi present. No wheezing or rales.  Chest:     Chest wall: No tenderness.  Neurological:     Mental Status: She is alert and oriented to person, place, and time.    BP 108/68 (BP Location: Left Arm, Cuff Size: Large)   Pulse 84   Temp 98 F (36.7 C) (Oral)   Resp 16   Ht 5\' 6"  (1.676 m)   Wt 243 lb 6.4 oz (110.4 kg)   SpO2 98%   BMI 39.29 kg/m  Wt Readings from Last 3 Encounters:  04/13/18 243 lb 6.4 oz (110.4 kg)  04/07/18 242 lb (109.8 kg)  03/02/18 238 lb 4 oz (108.1 kg)     Lab Results  Component Value Date   WBC 15.1 (H) 03/02/2018   HGB 13.7 03/02/2018   HCT 43.5 03/02/2018   PLT 340 03/02/2018   GLUCOSE 92 03/02/2018   CHOL 188 12/08/2017   TRIG 234.0 (H) 12/08/2017   HDL 44.70 12/08/2017   LDLDIRECT 120.0 12/08/2017   LDLCALC 112 (H) 12/09/2016   ALT 6 03/02/2018   AST 9 (L) 03/02/2018   NA 137 03/02/2018   K 3.6 03/02/2018   CL 102 03/02/2018   CREATININE 0.55 03/02/2018   BUN 12 03/02/2018   CO2 27 03/02/2018   TSH 1.19 12/08/2017   INR 0.9 05/06/2016   MICROALBUR <0.7 04/23/2016    Dg Chest 2 View  Result Date: 04/07/2018 CLINICAL DATA:  Cough and congestion for 4 days, chest tightness, bronchitis, former social smoker EXAM: CHEST  - 2 VIEW COMPARISON:  09/14/2016 FINDINGS: Upper normal heart size. Mediastinal contours and pulmonary vascularity normal. Mild atelectasis versus infiltrate at RIGHT base. Minimal central peribronchial thickening. Remaining lungs clear. No pleural effusion or pneumothorax. Bones demineralized. IMPRESSION: Bronchitic changes with hazy RIGHT basilar opacity either representing atelectasis or pneumonia. Electronically Signed   By: Lavonia Dana M.D.   On: 04/07/2018 17:22     Assessment & Plan:  Plan  I have discontinued Jameela H. Weis's azithromycin. I am also having her start on levofloxacin and chlorpheniramine-HYDROcodone. Additionally, I am having her maintain her aspirin, doxepin, PEGASYS, KLOR-CON M20, levothyroxine, clotrimazole-betamethasone, amLODipine, dicyclomine, omeprazole, furosemide, levofloxacin, and promethazine-dextromethorphan.  Meds ordered this encounter  Medications  . levofloxacin (LEVAQUIN) 500 MG tablet  Sig: Take 1 tablet (500 mg total) by mouth daily.    Dispense:  7 tablet    Refill:  0  . chlorpheniramine-HYDROcodone (TUSSIONEX PENNKINETIC ER) 10-8 MG/5ML SUER    Sig: Take 5 mLs by mouth at bedtime as needed for cough.    Dispense:  140 mL    Refill:  0    Problem List Items Addressed This Visit    None    Visit Diagnoses    Pneumonia due to infectious organism, unspecified laterality, unspecified part of lung    -  Primary   Relevant Medications   levofloxacin (LEVAQUIN) 500 MG tablet   chlorpheniramine-HYDROcodone (TUSSIONEX PENNKINETIC ER) 10-8 MG/5ML SUER       mucinex during day Consider pred taper if no better Recheck cxr in 3 weeks   Follow-up: Return if symptoms worsen or fail to improve.  Ann Held, DO

## 2018-04-13 NOTE — Patient Instructions (Addendum)
Please get a repeat chest xray in 3 weeks     Community-Acquired Pneumonia, Adult Pneumonia is an infection of the lungs. It causes swelling in the airways of the lungs. Mucus and fluid may also build up inside the airways. One type of pneumonia can happen while a person is in a hospital. A different type can happen when a person is not in a hospital (community-acquired pneumonia).  What are the causes?  This condition is caused by germs (viruses, bacteria, or fungi). Some types of germs can be passed from one person to another. This can happen when you breathe in droplets from the cough or sneeze of an infected person. What increases the risk? You are more likely to develop this condition if you:  Have a long-term (chronic) disease, such as: ? Chronic obstructive pulmonary disease (COPD). ? Asthma. ? Cystic fibrosis. ? Congestive heart failure. ? Diabetes. ? Kidney disease.  Have HIV.  Have sickle cell disease.  Have had your spleen removed.  Do not take good care of your teeth and mouth (poor dental hygiene).  Have a medical condition that increases the risk of breathing in droplets from your own mouth and nose.  Have a weakened body defense system (immune system).  Are a smoker.  Travel to areas where the germs that cause this illness are common.  Are around certain animals or the places they live. What are the signs or symptoms?  A dry cough.  A wet (productive) cough.  Fever.  Sweating.  Chest pain. This often happens when breathing deeply or coughing.  Fast breathing or trouble breathing.  Shortness of breath.  Shaking chills.  Feeling tired (fatigue).  Muscle aches. How is this treated? Treatment for this condition depends on many things. Most adults can be treated at home. In some cases, treatment must happen in a hospital. Treatment may include:  Medicines given by mouth or through an IV tube.  Being given extra oxygen.  Respiratory  therapy. In rare cases, treatment for very bad pneumonia may include:  Using a machine to help you breathe.  Having a procedure to remove fluid from around your lungs. Follow these instructions at home: Medicines  Take over-the-counter and prescription medicines only as told by your doctor. ? Only take cough medicine if you are losing sleep.  If you were prescribed an antibiotic medicine, take it as told by your doctor. Do not stop taking the antibiotic even if you start to feel better. General instructions   Sleep with your head and neck raised (elevated). You can do this by sleeping in a recliner or by putting a few pillows under your head.  Rest as needed. Get at least 8 hours of sleep each night.  Drink enough water to keep your pee (urine) pale yellow.  Eat a healthy diet that includes plenty of vegetables, fruits, whole grains, low-fat dairy products, and lean protein.  Do not use any products that contain nicotine or tobacco. These include cigarettes, e-cigarettes, and chewing tobacco. If you need help quitting, ask your doctor.  Keep all follow-up visits as told by your doctor. This is important. How is this prevented? A shot (vaccine) can help prevent pneumonia. Shots are often suggested for:  People older than 50 years of age.  People older than 50 years of age who: ? Are having cancer treatment. ? Have long-term (chronic) lung disease. ? Have problems with their body's defense system. You may also prevent pneumonia if you take these actions:  Get the flu (influenza) shot every year.  Go to the dentist as often as told.  Wash your hands often. If you cannot use soap and water, use hand sanitizer. Contact a doctor if:  You have a fever.  You lose sleep because your cough medicine does not help. Get help right away if:  You are short of breath and it gets worse.  You have more chest pain.  Your sickness gets worse. This is very serious if: ? You are an  older adult. ? Your body's defense system is weak.  You cough up blood. Summary  Pneumonia is an infection of the lungs.  Most adults can be treated at home. Some will need treatment in a hospital.  Drink enough water to keep your pee pale yellow.  Get at least 8 hours of sleep each night. This information is not intended to replace advice given to you by your health care provider. Make sure you discuss any questions you have with your health care provider. Document Released: 08/25/2007 Document Revised: 11/03/2017 Document Reviewed: 11/03/2017 Elsevier Interactive Patient Education  2019 [employer].

## 2018-04-14 ENCOUNTER — Other Ambulatory Visit: Payer: Self-pay | Admitting: Family Medicine

## 2018-04-14 DIAGNOSIS — R05 Cough: Secondary | ICD-10-CM

## 2018-04-14 DIAGNOSIS — R059 Cough, unspecified: Secondary | ICD-10-CM

## 2018-04-14 MED ORDER — PROMETHAZINE-DM 6.25-15 MG/5ML PO SYRP: 5.0000 mL | ORAL_SOLUTION | Freq: Three times a day (TID) | ORAL | 0 refills | Status: DC | PRN

## 2018-04-14 MED ORDER — PROMETHAZINE-CODEINE 6.25-10 MG/5ML PO SYRP: 5.0000 mL | ORAL_SOLUTION | Freq: Three times a day (TID) | ORAL | 0 refills | Status: DC | PRN

## 2018-04-14 NOTE — Telephone Encounter (Signed)
Ok to change phen dm to 1-2 tso

## 2018-04-14 NOTE — Telephone Encounter (Signed)
Patient would like to know if she could take the promethazine dm 1-2 teaspoonfuls instead of just 1 teaspoonful 4 times a day.  She does not want something that she can just take at bedtime because she coughs through out the day.  She declined rx for tussionex because it for only to take at bedtime. She would like to use [facility] so she can use good rx.

## 2018-04-14 NOTE — Telephone Encounter (Signed)
Pt stated that she is not in [place] and would like to know if promethazine-codeine (PHENERGAN WITH CODEINE) 6.25-10 MG/5ML syrup can be sent to  [place], [place] - [address]        [phone] (Phone) [phone] (Fax) today

## 2018-04-17 ENCOUNTER — Other Ambulatory Visit: Payer: Self-pay | Admitting: Family Medicine

## 2018-04-17 DIAGNOSIS — J189 Pneumonia, unspecified organism: Secondary | ICD-10-CM

## 2018-04-17 NOTE — Telephone Encounter (Signed)
Patient notified

## 2018-04-27 ENCOUNTER — Ambulatory Visit (HOSPITAL_BASED_OUTPATIENT_CLINIC_OR_DEPARTMENT_OTHER)
Admission: RE | Admit: 2018-04-27 | Discharge: 2018-04-27 | Disposition: A | Discharge status: Home / Self Care | Payer: Medicare HMO | Source: Ambulatory Visit | Attending: Family Medicine | Admitting: Family Medicine

## 2018-04-27 ENCOUNTER — Encounter: Payer: Self-pay | Admitting: Family Medicine

## 2018-04-27 ENCOUNTER — Inpatient Hospital Stay: Payer: Medicare HMO | Attending: Hematology & Oncology

## 2018-04-27 ENCOUNTER — Encounter: Payer: Self-pay | Admitting: Hematology & Oncology

## 2018-04-27 ENCOUNTER — Other Ambulatory Visit: Payer: Self-pay

## 2018-04-27 ENCOUNTER — Inpatient Hospital Stay (HOSPITAL_BASED_OUTPATIENT_CLINIC_OR_DEPARTMENT_OTHER): Payer: Medicare HMO | Admitting: Hematology & Oncology

## 2018-04-27 VITALS — BP 130/72 | HR 71 | Temp 98.3°F | Resp 18 | Wt 244.0 lb

## 2018-04-27 DIAGNOSIS — J181 Lobar pneumonia, unspecified organism: Secondary | ICD-10-CM | POA: Diagnosis not present

## 2018-04-27 DIAGNOSIS — D471 Chronic myeloproliferative disease: Secondary | ICD-10-CM

## 2018-04-27 DIAGNOSIS — E039 Hypothyroidism, unspecified: Secondary | ICD-10-CM | POA: Diagnosis not present

## 2018-04-27 DIAGNOSIS — Z79899 Other long term (current) drug therapy: Secondary | ICD-10-CM | POA: Diagnosis not present

## 2018-04-27 DIAGNOSIS — J189 Pneumonia, unspecified organism: Secondary | ICD-10-CM | POA: Diagnosis not present

## 2018-04-27 DIAGNOSIS — E032 Hypothyroidism due to medicaments and other exogenous substances: Secondary | ICD-10-CM

## 2018-04-27 LAB — CMP (CANCER CENTER ONLY)
ALT: 7 U/L (ref 0–44)
AST: 10 U/L — ABNORMAL LOW (ref 15–41)
Albumin: 4.9 g/dL (ref 3.5–5.0)
Alkaline Phosphatase: 136 U/L — ABNORMAL HIGH (ref 38–126)
Anion gap: 9 (ref 5–15)
BUN: 9 mg/dL (ref 6–20)
CO2: 26 mmol/L (ref 22–32)
Calcium: 11.3 mg/dL — ABNORMAL HIGH (ref 8.9–10.3)
Chloride: 103 mmol/L (ref 98–111)
Creatinine: 0.61 mg/dL (ref 0.44–1.00)
GFR, Est AFR Am: >60 mL/min (ref >60)
GFR, Estimated: >60 mL/min (ref >60)
Glucose, Bld: 99 mg/dL (ref 70–99)
Potassium: 3.7 mmol/L (ref 3.5–5.1)
Sodium: 138 mmol/L (ref 135–145)
TOTAL PROTEIN: 7.8 g/dL (ref 6.5–8.1)
Total Bilirubin: 0.6 mg/dL (ref 0.3–1.2)

## 2018-04-27 LAB — CBC WITH DIFFERENTIAL (CANCER CENTER ONLY)
Abs Immature Granulocytes: 0.26 10*3/uL — ABNORMAL HIGH (ref 0.00–0.07)
Basophils Absolute: 0.1 10*3/uL (ref 0.0–0.1)
Basophils Relative: 0 %
Eosinophils Absolute: 0.2 10*3/uL (ref 0.0–0.5)
Eosinophils Relative: 1 %
HCT: 45 % (ref 36.0–46.0)
Hemoglobin: 14.4 g/dL (ref 12.0–15.0)
Immature Granulocytes: 2 %
Lymphocytes Relative: 16 %
Lymphs Abs: 2.7 10*3/uL (ref 0.7–4.0)
MCH: 29.7 pg (ref 26.0–34.0)
MCHC: 32 g/dL (ref 30.0–36.0)
MCV: 92.8 fL (ref 80.0–100.0)
MONO ABS: 0.9 10*3/uL (ref 0.1–1.0)
Monocytes Relative: 5 %
Neutro Abs: 13.2 10*3/uL — ABNORMAL HIGH (ref 1.7–7.7)
Neutrophils Relative %: 76 %
Platelet Count: 357 10*3/uL (ref 150–400)
RBC: 4.85 MIL/uL (ref 3.87–5.11)
RDW: 12.9 % (ref 11.5–15.5)
WBC Count: 17.3 10*3/uL — ABNORMAL HIGH (ref 4.0–10.5)
nRBC: 0 % (ref 0.0–0.2)

## 2018-04-27 LAB — SAVE SMEAR (SSMR)

## 2018-04-27 LAB — LACTATE DEHYDROGENASE: LDH: 141 U/L (ref 98–192)

## 2018-04-27 NOTE — Progress Notes (Signed)
Hematology and Oncology Follow Up Visit  Brianna Fletcher [id number] 04/01/1970 48 y.o. 04/27/2018   Principle Diagnosis:  Chronic myeloproliferative syndrome-Triple negative Sub-clinical hypothyroidism (+) p-ANCA  Current Therapy:   Peg-Interferon q weekly - q 4 week dosing - On hold due to cost Synthroid 0.050 mg po q day   Interim History: Brianna Fletcher is here today for follow-up.  She is doing pretty well.  She recently had pneumonia.  She has been on some antibiotics.  She sees her family doctor today.  She will have a chest x-ray done.  She has had no problems with fever.  She has had no nausea or vomiting.  She still has a little bit of a cough but it is nonproductive.  She has had no rashes.  There is been no leg swelling.  She has had no bleeding.  There is been no diarrhea.  Currently, her performance status is ECOG 1.     Medications:  Allergies as of 04/27/2018      Reactions   Lisinopril Cough, Other (See Comments)   Losartan Cough, Other (See Comments)   Lubiprostone Other (See Comments)   Made her sick Made her sick      Medication List       Accurate as of April 27, 2018 11:39 AM. Always use your most recent med list.        amLODipine 10 MG tablet Commonly known as:  NORVASC Take 1 tablet (10 mg total) by mouth daily.   aspirin 81 MG tablet Take 81 mg by mouth daily.   chlorpheniramine-HYDROcodone 10-8 MG/5ML Suer Commonly known as:  TUSSIONEX PENNKINETIC ER Take 5 mLs by mouth at bedtime as needed for cough.   clotrimazole-betamethasone cream Commonly known as:  LOTRISONE APPLY 1 APPLICATION TOPICALLY 2 TIMES A DAY.   dicyclomine 10 MG capsule Commonly known as:  BENTYL Take 1 capsule (10 mg total) by mouth 4 (four) times daily -  before meals and at bedtime.   doxepin 25 MG capsule Commonly known as:  SINEQUAN Take 2 capsules (50 mg total) by mouth at bedtime.   furosemide 20 MG tablet Commonly known as:  LASIX Take 1 tablet (20 mg  total) by mouth daily.   KLOR-CON M20 20 MEQ tablet Generic drug:  potassium chloride SA Take 20 mEq by mouth 2 (two) times daily.   levofloxacin 500 MG tablet Commonly known as:  LEVAQUIN TAKE 1 TABLET (500 MG TOTAL) BY MOUTH DAILY.   levothyroxine 50 MCG tablet Commonly known as:  SYNTHROID, LEVOTHROID TAKE 1 TABLET (50 MCG TOTAL) DAILY BEFORE BREAKFAST BY MOUTH.   omeprazole 20 MG capsule Commonly known as:  PRILOSEC Take 1 capsule (20 mg total) by mouth daily.   PEGASYS 180 MCG/ML injection Generic drug:  peginterferon alfa-2a Inject 0.45 mLs (81 mcg total) into the skin every 14 (fourteen) days.   promethazine-codeine 6.25-10 MG/5ML syrup Commonly known as:  PHENERGAN with CODEINE Take 5 mLs by mouth every 8 (eight) hours as needed for cough.   promethazine-dextromethorphan 6.25-15 MG/5ML syrup Commonly known as:  PROMETHAZINE-DM Take 5-10 mLs by mouth every 8 (eight) hours as needed for cough.       Allergies:  Allergies  Allergen Reactions  . Lisinopril Cough and Other (See Comments)  . Losartan Cough and Other (See Comments)  . Lubiprostone Other (See Comments)    Made her sick Made her sick    Past Medical History, Surgical history, Social history, and Family History were reviewed and  updated.  Review of Systems: Review of Systems  Constitutional: Positive for malaise/fatigue.  HENT: Negative.   Eyes: Negative.   Respiratory: Negative.   Cardiovascular: Negative.   Gastrointestinal: Positive for diarrhea.  Genitourinary: Negative.   Musculoskeletal: Positive for myalgias.  Skin: Negative.   Neurological: Positive for dizziness.  Psychiatric/Behavioral: Negative.      Physical Exam:  weight is 244 lb (110.7 kg). Her oral temperature is 98.3 F (36.8 C). Her blood pressure is 130/72 and her pulse is 71. Her respiration is 18 and oxygen saturation is 100%.   Wt Readings from Last 3 Encounters:  04/27/18 244 lb (110.7 kg)  04/13/18 243 lb 6.4 oz  (110.4 kg)  04/07/18 242 lb (109.8 kg)    Physical Exam Vitals signs reviewed.  HENT:     Head: Normocephalic and atraumatic.  Eyes:     Pupils: Pupils are equal, round, and reactive to light.  Neck:     Musculoskeletal: Normal range of motion.  Cardiovascular:     Rate and Rhythm: Normal rate and regular rhythm.     Heart sounds: Normal heart sounds.  Pulmonary:     Effort: Pulmonary effort is normal.     Breath sounds: Normal breath sounds.  Abdominal:     General: Bowel sounds are normal.     Palpations: Abdomen is soft.  Musculoskeletal: Normal range of motion.        General: No tenderness or deformity.  Lymphadenopathy:     Cervical: No cervical adenopathy.  Skin:    General: Skin is warm and dry.     Findings: No erythema or rash.  Neurological:     Mental Status: She is alert and oriented to person, place, and time.  Psychiatric:        Behavior: Behavior normal.        Thought Content: Thought content normal.        Judgment: Judgment normal.      Lab Results  Component Value Date   WBC 17.3 (H) 04/27/2018   HGB 14.4 04/27/2018   HCT 45.0 04/27/2018   MCV 92.8 04/27/2018   PLT 357 04/27/2018   Lab Results  Component Value Date   FERRITIN 50 08/23/2017   IRON 98 08/23/2017   TIBC 328 08/23/2017   UIBC 230 08/23/2017   IRONPCTSAT 30 08/23/2017   Lab Results  Component Value Date   RETICCTPCT 2.1 04/12/2017   RBC 4.85 04/27/2018   RETICCTABS 116.1 11/20/2013   No results found for: KPAFRELGTCHN, LAMBDASER, KAPLAMBRATIO No results found for: IGGSERUM, IGA, IGMSERUM No results found for: Odetta Pink, SPEI   Chemistry      Component Value Date/Time   NA 138 04/27/2018 1021   NA 140 02/08/2017 0925   NA 136 03/10/2016 1147   K 3.7 04/27/2018 1021   K 3.4 02/08/2017 0925   K 3.9 03/10/2016 1147   CL 103 04/27/2018 1021   CL 105 02/08/2017 0925   CO2 26 04/27/2018 1021   CO2 26 02/08/2017  0925   CO2 24 03/10/2016 1147   BUN 9 04/27/2018 1021   BUN 10 02/08/2017 0925   BUN 10.2 03/10/2016 1147   CREATININE 0.61 04/27/2018 1021   CREATININE 0.7 02/08/2017 0925   CREATININE 0.7 03/10/2016 1147      Component Value Date/Time   CALCIUM 11.3 (H) 04/27/2018 1021   CALCIUM 10.4 (H) 02/08/2017 0925   CALCIUM 10.7 (H) 03/10/2016 1147   ALKPHOS 136 (  H) 04/27/2018 1021   ALKPHOS 106 (H) 02/08/2017 0925   ALKPHOS 132 03/10/2016 1147   AST 10 (L) 04/27/2018 1021   AST 12 03/10/2016 1147   ALT 7 04/27/2018 1021   ALT 16 02/08/2017 0925   ALT 8 03/10/2016 1147   BILITOT 0.6 04/27/2018 1021   BILITOT 0.63 03/10/2016 1147      Impression and Plan: Brianna Fletcher is a very pleasant 48 yo caucasian female with a triple negative myeloproliferative syndrome though all work up has been negative so far.   From my point of view, everything looks great.  Her blood looks fine under the microscope.  We will plan to see her back in about 6 weeks.  I think this would be reasonable.  [name], MD 2/6/202011:39 AM

## 2018-04-28 ENCOUNTER — Encounter: Payer: Self-pay | Admitting: Family Medicine

## 2018-04-28 LAB — IRON AND TIBC
Iron: 82 ug/dL (ref 41–142)
SATURATION RATIOS: 24 % (ref 21–57)
TIBC: 344 ug/dL (ref 236–444)
UIBC: 262 ug/dL (ref 120–384)

## 2018-04-28 LAB — TSH: TSH: 1.629 u[IU]/mL (ref 0.308–3.960)

## 2018-04-28 LAB — FERRITIN: Ferritin: 64 ng/mL (ref 11–307)

## 2018-05-04 ENCOUNTER — Encounter: Payer: Self-pay | Admitting: Family Medicine

## 2018-06-06 ENCOUNTER — Encounter: Payer: Self-pay | Admitting: Hematology & Oncology

## 2018-06-06 ENCOUNTER — Other Ambulatory Visit: Payer: Self-pay

## 2018-06-06 ENCOUNTER — Inpatient Hospital Stay: Payer: Medicare HMO | Attending: Hematology & Oncology | Admitting: Hematology & Oncology

## 2018-06-06 ENCOUNTER — Inpatient Hospital Stay: Payer: Medicare HMO

## 2018-06-06 VITALS — BP 143/65 | HR 56 | Temp 97.6°F | Resp 16 | Wt 241.0 lb

## 2018-06-06 DIAGNOSIS — Z79899 Other long term (current) drug therapy: Secondary | ICD-10-CM

## 2018-06-06 DIAGNOSIS — E039 Hypothyroidism, unspecified: Secondary | ICD-10-CM

## 2018-06-06 DIAGNOSIS — D471 Chronic myeloproliferative disease: Secondary | ICD-10-CM

## 2018-06-06 LAB — CMP (CANCER CENTER ONLY)
ALT: 8 U/L (ref 0–44)
AST: 11 U/L — ABNORMAL LOW (ref 15–41)
Albumin: 4.9 g/dL (ref 3.5–5.0)
Alkaline Phosphatase: 133 U/L — ABNORMAL HIGH (ref 38–126)
Anion gap: 10 (ref 5–15)
BILIRUBIN TOTAL: 0.5 mg/dL (ref 0.3–1.2)
BUN: 10 mg/dL (ref 6–20)
CO2: 26 mmol/L (ref 22–32)
Calcium: 11 mg/dL — ABNORMAL HIGH (ref 8.9–10.3)
Chloride: 102 mmol/L (ref 98–111)
Creatinine: 0.65 mg/dL (ref 0.44–1.00)
GFR, Est AFR Am: >60 mL/min (ref >60)
GFR, Estimated: >60 mL/min (ref >60)
GLUCOSE: 103 mg/dL — AB (ref 70–99)
Potassium: 3.7 mmol/L (ref 3.5–5.1)
Sodium: 138 mmol/L (ref 135–145)
TOTAL PROTEIN: 8.1 g/dL (ref 6.5–8.1)

## 2018-06-06 LAB — CBC WITH DIFFERENTIAL (CANCER CENTER ONLY)
Abs Immature Granulocytes: 0.3 10*3/uL — ABNORMAL HIGH (ref 0.00–0.07)
Basophils Absolute: 0.1 10*3/uL (ref 0.0–0.1)
Basophils Relative: 1 %
EOS ABS: 0.1 10*3/uL (ref 0.0–0.5)
Eosinophils Relative: 1 %
HCT: 46.1 % — ABNORMAL HIGH (ref 36.0–46.0)
Hemoglobin: 14.6 g/dL (ref 12.0–15.0)
Immature Granulocytes: 2 %
Lymphocytes Relative: 20 %
Lymphs Abs: 3.1 10*3/uL (ref 0.7–4.0)
MCH: 29.3 pg (ref 26.0–34.0)
MCHC: 31.7 g/dL (ref 30.0–36.0)
MCV: 92.4 fL (ref 80.0–100.0)
MONOS PCT: 4 %
Monocytes Absolute: 0.7 10*3/uL (ref 0.1–1.0)
NEUTROS PCT: 72 %
Neutro Abs: 11.1 10*3/uL — ABNORMAL HIGH (ref 1.7–7.7)
Platelet Count: 331 10*3/uL (ref 150–400)
RBC: 4.99 MIL/uL (ref 3.87–5.11)
RDW: 12.2 % (ref 11.5–15.5)
WBC Count: 15.3 10*3/uL — ABNORMAL HIGH (ref 4.0–10.5)
nRBC: 0 % (ref 0.0–0.2)

## 2018-06-06 LAB — SAVE SMEAR(SSMR), FOR PROVIDER SLIDE REVIEW

## 2018-06-06 LAB — LACTATE DEHYDROGENASE: LDH: 168 U/L (ref 98–192)

## 2018-06-06 NOTE — Progress Notes (Signed)
Hematology and Oncology Follow Up Visit  Brianna Fletcher [id number] 12-24-1970 48 y.o. 06/06/2018   Principle Diagnosis:  Chronic myeloproliferative syndrome-Triple negative Sub-clinical hypothyroidism (+) p-ANCA  Current Therapy:   Peg-Interferon q weekly - q 4 week dosing - On hold due to cost Synthroid 0.050 mg po q day   Interim History: Brianna Fletcher is here today for follow-up.  She is quite emotional today.  She cannot make it up to [place] to see her new grandson.  This really is making it hard on her.  However, it just so happened that when she was supposed to go to [place], her mom got sick and since Brianna Fletcher is an only child, she had to be here to help take care of her mom.  It sounds like her mom may have dementia.  Thankfully, Brianna Fletcher has done well with her chronic myeloproliferative problem.  Her white cell count is not more elevated.  She is holding quite steady.  She has had no rashes.  She has had no cough.  There has been no change in bowel or bladder habits.  She has had no leg swelling.    Currently, her performance status is ECOG 1.     Medications:  Allergies as of 06/06/2018      Reactions   Lisinopril Cough, Other (See Comments)   Losartan Cough, Other (See Comments)   Lubiprostone Other (See Comments)   Made her sick Made her sick      Medication List       Accurate as of June 06, 2018 12:20 PM. Always use your most recent med list.        amLODipine 10 MG tablet Commonly known as:  NORVASC Take 1 tablet (10 mg total) by mouth daily.   aspirin 81 MG tablet Take 81 mg by mouth daily.   chlorpheniramine-HYDROcodone 10-8 MG/5ML Suer Commonly known as:  Tussionex Pennkinetic ER Take 5 mLs by mouth at bedtime as needed for cough.   clotrimazole-betamethasone cream Commonly known as:  LOTRISONE APPLY 1 APPLICATION TOPICALLY 2 TIMES A DAY.   dicyclomine 10 MG capsule Commonly known as:  Bentyl Take 1 capsule (10 mg total) by  mouth 4 (four) times daily -  before meals and at bedtime.   doxepin 25 MG capsule Commonly known as:  SINEQUAN Take 2 capsules (50 mg total) by mouth at bedtime.   furosemide 20 MG tablet Commonly known as:  LASIX Take 1 tablet (20 mg total) by mouth daily.   Klor-Con M20 20 MEQ tablet Generic drug:  potassium chloride SA Take 20 mEq by mouth 2 (two) times daily.   levofloxacin 500 MG tablet Commonly known as:  LEVAQUIN TAKE 1 TABLET (500 MG TOTAL) BY MOUTH DAILY.   levothyroxine 50 MCG tablet Commonly known as:  SYNTHROID, LEVOTHROID TAKE 1 TABLET (50 MCG TOTAL) DAILY BEFORE BREAKFAST BY MOUTH.   omeprazole 20 MG capsule Commonly known as:  PRILOSEC Take 1 capsule (20 mg total) by mouth daily.   Pegasys 180 MCG/ML injection Generic drug:  peginterferon alfa-2a Inject 0.45 mLs (81 mcg total) into the skin every 14 (fourteen) days.   promethazine-codeine 6.25-10 MG/5ML syrup Commonly known as:  PHENERGAN with CODEINE Take 5 mLs by mouth every 8 (eight) hours as needed for cough.   promethazine-dextromethorphan 6.25-15 MG/5ML syrup Commonly known as:  PROMETHAZINE-DM Take 5-10 mLs by mouth every 8 (eight) hours as needed for cough.       Allergies:  Allergies  Allergen Reactions  . Lisinopril Cough and Other (See Comments)  . Losartan Cough and Other (See Comments)  . Lubiprostone Other (See Comments)    Made her sick Made her sick    Past Medical History, Surgical history, Social history, and Family History were reviewed and updated.  Review of Systems: Review of Systems  Constitutional: Positive for malaise/fatigue.  HENT: Negative.   Eyes: Negative.   Respiratory: Negative.   Cardiovascular: Negative.   Gastrointestinal: Positive for diarrhea.  Genitourinary: Negative.   Musculoskeletal: Positive for myalgias.  Skin: Negative.   Neurological: Positive for dizziness.  Psychiatric/Behavioral: Negative.      Physical Exam:  weight is 241 lb (109.3  kg). Her oral temperature is 97.6 F (36.4 C). Her blood pressure is 143/65 (abnormal) and her pulse is 56 (abnormal). Her respiration is 16 and oxygen saturation is 98%.   Wt Readings from Last 3 Encounters:  06/06/18 241 lb (109.3 kg)  04/27/18 244 lb (110.7 kg)  04/13/18 243 lb 6.4 oz (110.4 kg)    Physical Exam Vitals signs reviewed.  HENT:     Head: Normocephalic and atraumatic.  Eyes:     Pupils: Pupils are equal, round, and reactive to light.  Neck:     Musculoskeletal: Normal range of motion.  Cardiovascular:     Rate and Rhythm: Normal rate and regular rhythm.     Heart sounds: Normal heart sounds.  Pulmonary:     Effort: Pulmonary effort is normal.     Breath sounds: Normal breath sounds.  Abdominal:     General: Bowel sounds are normal.     Palpations: Abdomen is soft.  Musculoskeletal: Normal range of motion.        General: No tenderness or deformity.  Lymphadenopathy:     Cervical: No cervical adenopathy.  Skin:    General: Skin is warm and dry.     Findings: No erythema or rash.  Neurological:     Mental Status: She is alert and oriented to person, place, and time.  Psychiatric:        Behavior: Behavior normal.        Thought Content: Thought content normal.        Judgment: Judgment normal.      Lab Results  Component Value Date   WBC 15.3 (H) 06/06/2018   HGB 14.6 06/06/2018   HCT 46.1 (H) 06/06/2018   MCV 92.4 06/06/2018   PLT 331 06/06/2018   Lab Results  Component Value Date   FERRITIN 64 04/27/2018   IRON 82 04/27/2018   TIBC 344 04/27/2018   UIBC 262 04/27/2018   IRONPCTSAT 24 04/27/2018   Lab Results  Component Value Date   RETICCTPCT 2.1 04/12/2017   RBC 4.99 06/06/2018   RETICCTABS 116.1 11/20/2013   No results found for: KPAFRELGTCHN, LAMBDASER, KAPLAMBRATIO No results found for: IGGSERUM, IGA, IGMSERUM No results found for: Odetta Pink, SPEI   Chemistry       Component Value Date/Time   NA 138 06/06/2018 1109   NA 140 02/08/2017 0925   NA 136 03/10/2016 1147   K 3.7 06/06/2018 1109   K 3.4 02/08/2017 0925   K 3.9 03/10/2016 1147   CL 102 06/06/2018 1109   CL 105 02/08/2017 0925   CO2 26 06/06/2018 1109   CO2 26 02/08/2017 0925   CO2 24 03/10/2016 1147   BUN 10 06/06/2018 1109   BUN 10 02/08/2017 0925   BUN 10.2 03/10/2016  1147   CREATININE 0.65 06/06/2018 1109   CREATININE 0.7 02/08/2017 0925   CREATININE 0.7 03/10/2016 1147      Component Value Date/Time   CALCIUM 11.0 (H) 06/06/2018 1109   CALCIUM 10.4 (H) 02/08/2017 0925   CALCIUM 10.7 (H) 03/10/2016 1147   ALKPHOS 133 (H) 06/06/2018 1109   ALKPHOS 106 (H) 02/08/2017 0925   ALKPHOS 132 03/10/2016 1147   AST 11 (L) 06/06/2018 1109   AST 12 03/10/2016 1147   ALT 8 06/06/2018 1109   ALT 16 02/08/2017 0925   ALT 8 03/10/2016 1147   BILITOT 0.5 06/06/2018 1109   BILITOT 0.63 03/10/2016 1147      Impression and Plan: Brianna Fletcher is a very pleasant 48 yo caucasian female with a triple negative myeloproliferative syndrome though all work up has been negative so far.   From my point of view, everything looks great.  Her blood looks fine under the microscope.  We will now get her back in 8 weeks.  I think we can move her appointments out a little bit more now that her white cell count is holding steady.  [name], MD 3/17/202012:20 PM

## 2018-06-07 LAB — IRON AND TIBC
Iron: 82 ug/dL (ref 41–142)
Saturation Ratios: 24 % (ref 21–57)
TIBC: 338 ug/dL (ref 236–444)
UIBC: 256 ug/dL (ref 120–384)

## 2018-06-07 LAB — FERRITIN: Ferritin: 64 ng/mL (ref 11–307)

## 2018-06-12 ENCOUNTER — Other Ambulatory Visit: Payer: Self-pay | Admitting: Family Medicine

## 2018-06-12 DIAGNOSIS — I10 Essential (primary) hypertension: Secondary | ICD-10-CM

## 2018-06-14 ENCOUNTER — Telehealth (INDEPENDENT_AMBULATORY_CARE_PROVIDER_SITE_OTHER): Payer: Medicare HMO | Admitting: Family

## 2018-06-14 ENCOUNTER — Other Ambulatory Visit: Payer: Self-pay

## 2018-06-14 ENCOUNTER — Ambulatory Visit: Payer: Self-pay | Admitting: *Deleted

## 2018-06-14 DIAGNOSIS — R1030 Lower abdominal pain, unspecified: Secondary | ICD-10-CM

## 2018-06-14 MED ORDER — METRONIDAZOLE 500 MG PO TABS: 500.0000 mg | ORAL_TABLET | Freq: Three times a day (TID) | ORAL | 0 refills | Status: AC

## 2018-06-14 MED ORDER — CIPROFLOXACIN HCL 500 MG PO TABS: 500.0000 mg | ORAL_TABLET | Freq: Two times a day (BID) | ORAL | 0 refills | Status: AC

## 2018-06-14 NOTE — Telephone Encounter (Signed)
  Reason for Disposition . [1] MILD-MODERATE pain AND [2] constant AND [3] present > 2 hours  Protocols used: ABDOMINAL PAIN - [facility]

## 2018-06-14 NOTE — Progress Notes (Signed)
Virtual Visit via Video Note  I connected with Brianna Fletcher on 06/14/18 at 10:00 AM EDT by a video enabled telemedicine application and verified that I am speaking with the correct person using two identifiers.   I discussed the limitations of evaluation and management by telemedicine and the availability of in person appointments. The patient expressed understanding and agreed to proceed.  History of Present Illness: Patient is a 48 year old female who presents via video visit today with chief complaint of abdominal pain.  She reports that pain initially began the evening of March 23 around 6 PM.  Pain began after she ate dinner.  Later that evening she had one bowel movement which was described as formed.  Yesterday she developed a discomfort around her midsection which felt "really odd.".  She felt uncomfortable.  She then went to bed.  Yesterday she ate lunch but then in the afternoon she developed moderate abdominal pain.  She had a bowel movement hoping that this would help her discomfort but pain was unchanged.  At 7 PM last night she developed more of a suprapubic pain.  She noted significant pressure in that area which was worse when she was either moving her bowels or urinating.  She describes feeling overall "bad".  She denies associated hematuria, dysuria, nausea, or vomiting.  She denies obvious constipation.  Reports that she took her temperature last night and had a low-grade temp of 99.3.  Repeated her temperature this morning and it was 98.7.   Observations/Objective: General: Well-appearing adult female in no acute distress. Respiratory: Breathing is even and nonlabored. Abdominal: Reports mild discomfort in the suprapubic region when patient is asked to press on her abdomen.  No significant tenderness anywhere else. Psychiatric: Patient is alert and oriented x3, calm and pleasant affect.  Assessment and Plan: Abdominal pain- most likely etiology is diverticulitis.  UTI is  another possibility.  As we are trying to keep patient is out of the clinic and not do any nonemergent imaging due to COVID-19, we will go ahead and treat the patient empirically for both UTI and diverticulitis.  A prescription was sent for Cipro 500 mg twice daily x10 days as well as metronidazole 500 mg p.o. 3 times daily x10 days.  She is advised to avoid alcohol while using this medication.  In addition she is advised to call if symptoms are not improved in 2 to 3 days and to proceed to the emergency department should she develop worsening abdominal pain or fever greater than 101.  Patient verbalizes understanding.  Follow Up Instructions:    I discussed the assessment and treatment plan with the patient. The patient was provided an opportunity to ask questions and all were answered. The patient agreed with the plan and demonstrated an understanding of the instructions.   The patient was advised to call back or seek an in-person evaluation if the symptoms worsen or if the condition fails to improve as anticipated.  I provided 25 minutes of non-face-to-face time during this encounter.   Nance Pear, NP

## 2018-06-14 NOTE — Telephone Encounter (Signed)
Pt called with severe constant abdominal pain since 06/12/2018; she says that she has snot been able to sleep due to pain; the pt says that she did have BM on 06/13/2018; her pain is in her entire lower abdomen but hurts worse in the center; her temp on 06/14/2018 at 0715 was 98.7; pt answers "no" to all [facility] Virus Evaluation; recommendations made per nurse triage protocol; the pt would like to have a e-visit with Dr Etter Sjogren, [facility]; she can be contacted at [phone] or [email or web address]; will route to office for notification.  Answer Assessment - Initial Assessment Questions 1. LOCATION: "Where does it hurt?"      Lower abdomen 2. RADIATION: "Does the pain shoot anywhere else?" (e.g., chest, back)     ?lower back 3. ONSET: "When did the pain begin?" (e.g., minutes, hours or days ago)      06/12/2018 around 1700 4. SUDDEN: "Gradual or sudden onset?"     suddenly 5. PATTERN "Does the pain come and go, or is it constant?"    - If constant: "Is it getting better, staying the same, or worsening?"      (Note: Constant means the pain never goes away completely; most serious pain is constant and it progresses)     - If intermittent: "How long does it last?" "Do you have pain now?"     (Note: Intermittent means the pain goes away completely between bouts)     constant 6. SEVERITY: "How bad is the pain?"  (e.g., Scale 1-10; mild, moderate, or severe)   - MILD (1-3): doesn't interfere with normal activities, abdomen soft and not tender to touch    - MODERATE (4-7): interferes with normal activities or awakens from sleep, tender to touch    - SEVERE (8-10): excruciating pain, doubled over, unable to do any normal activities      Mild to moderate 7. RECURRENT SYMPTOM: "Have you ever had this type of abdominal pain before?" If so, ask: "When was the last time?" and "What happened that time?"      Yes; GI issues; says pain is different 8. CAUSE: "What do you think is causing the abdominal  pain?"     Not sure 9. RELIEVING/AGGRAVATING FACTORS: "What makes it better or worse?" (e.g., movement, antacids, bowel movement)     Nothing makes better or worse 10. OTHER SYMPTOMS: "Has there been any vomiting, diarrhea, constipation, or urine problems?"       no 11. PREGNANCY: "Is there any chance you are pregnant?" "When was your last menstrual period?"       No Hysterectomy  Protocols used: ABDOMINAL PAIN - [facility]

## 2018-06-26 ENCOUNTER — Ambulatory Visit: Payer: Self-pay

## 2018-06-26 NOTE — Telephone Encounter (Signed)
Please contact pt and advise her to self quarantine for 14 days following the onset of symptoms. If worsening cough, fever >101, increased weakness, please call for a virtual visit- if severe go to the ED.

## 2018-06-26 NOTE — Telephone Encounter (Signed)
Incoming call from Patient.  With an update since last virtual visit.   Minor  Spread.  Patient calling to state that after her virtual visit  With Inda Castle FNP .  Patient just found out that her uncle was exposed to someone who tested positive  With Covic-19 .  Uncle has not tested . Patient has been around [place].  Patient reports Onset of Sx. After Virtual visit with Debbrah Alar.  Body aches. , and GI issues non productive cough.  Denies SOB, sore throat with drainage.  Low grade fever.  Body aches and Pain.  Patient wanted Dr.  Etter Sjogren -Che[facility] Rous to be aware of her Sx.  Patient states that she has complete the antibiotics that [facility] placed her on.    Reason for Disposition . COVID-19 Home Isolation, questions about  Answer Assessment - Initial Assessment Questions 1. COVID-19 DIAGNOSIS: "Who made your Coronavirus (COVID-19) diagnosis?" "Was it confirmed by a positive lab test?" If not diagnosed by a HCP, ask "Are there lots of cases (community spread) where you live?" (See public health department website, if unsure)   * MAJOR community spread: high number of cases; numbers of cases are increasing; many people hospitalized.   * MINOR community spread: low number of cases; not increasing; few or no people hospitalized     minor 2. ONSET: "When did the COVID-19 symptoms start?"      After talking with Inda Castle.   3. WORST SYMPTOM: "What is your worst symptom?" (e.g., cough, fever, shortness of breath, muscle aches)    stomach issues , body aches 4. COUGH: "How bad is the cough?"       Non productive 5. FEVER: "Do you have a fever?" If so, ask: "What is your temperature, how was it measured, and when did it start?"     Low grade 99.4 6. RESPIRATORY STATUS: "Describe your breathing?" (e.g., shortness of breath, wheezing, unable to speak)      Not wheezing,  Denies SOB 7. BETTER-SAME-WORSE: "Are you getting better, staying the same or getting worse compared to yesterday?"  If getting  worse, ask, "In what way?"     *No Answer* 8. HIGH RISK DISEASE: "Do you have any chronic medical problems?" (e.g., asthma, heart or lung disease, weak immune system, etc.)     *No Answer* 9. PREGNANCY: "Is there any chance you are pregnant?" "When was your last menstrual period?"     *No Answer* 10. OTHER SYMPTOMS: "Do you have any other symptoms?"  (e.g., runny nose, headache, sore throat, loss of smell)       *No Answer*  Protocols used: CORONAVIRUS (COVID-19) DIAGNOSED OR SUSPECTED-[facility]

## 2018-06-26 NOTE — Telephone Encounter (Signed)
Happy to do virtual visit if she wants to do another one---other wise agree with Lenna Sciara

## 2018-06-26 NOTE — Telephone Encounter (Signed)
FYI

## 2018-06-27 NOTE — Telephone Encounter (Signed)
Patient notified and will call if needed or ED if severe.

## 2018-08-02 ENCOUNTER — Inpatient Hospital Stay: Payer: Medicare HMO | Attending: Hematology & Oncology | Admitting: Hematology & Oncology

## 2018-08-02 ENCOUNTER — Inpatient Hospital Stay: Payer: Medicare HMO

## 2018-08-02 ENCOUNTER — Encounter: Payer: Self-pay | Admitting: Hematology & Oncology

## 2018-08-02 ENCOUNTER — Other Ambulatory Visit: Payer: Self-pay

## 2018-08-02 VITALS — BP 143/69 | HR 72 | Temp 97.0°F | Resp 20 | Wt 236.4 lb

## 2018-08-02 DIAGNOSIS — D471 Chronic myeloproliferative disease: Secondary | ICD-10-CM

## 2018-08-02 DIAGNOSIS — E039 Hypothyroidism, unspecified: Secondary | ICD-10-CM | POA: Insufficient documentation

## 2018-08-02 DIAGNOSIS — Z79899 Other long term (current) drug therapy: Secondary | ICD-10-CM | POA: Insufficient documentation

## 2018-08-02 LAB — CBC WITH DIFFERENTIAL (CANCER CENTER ONLY)
Abs Immature Granulocytes: 0.34 10*3/uL — ABNORMAL HIGH (ref 0.00–0.07)
Basophils Absolute: 0.1 10*3/uL (ref 0.0–0.1)
Basophils Relative: 0 %
Eosinophils Absolute: 0.2 10*3/uL (ref 0.0–0.5)
Eosinophils Relative: 1 %
HCT: 41.9 % (ref 36.0–46.0)
Hemoglobin: 13.7 g/dL (ref 12.0–15.0)
Immature Granulocytes: 2 %
Lymphocytes Relative: 16 %
Lymphs Abs: 2.9 10*3/uL (ref 0.7–4.0)
MCH: 30 pg (ref 26.0–34.0)
MCHC: 32.7 g/dL (ref 30.0–36.0)
MCV: 91.9 fL (ref 80.0–100.0)
Monocytes Absolute: 0.8 10*3/uL (ref 0.1–1.0)
Monocytes Relative: 5 %
Neutro Abs: 13.3 10*3/uL — ABNORMAL HIGH (ref 1.7–7.7)
Neutrophils Relative %: 76 %
Platelet Count: 306 10*3/uL (ref 150–400)
RBC: 4.56 MIL/uL (ref 3.87–5.11)
RDW: 12.7 % (ref 11.5–15.5)
WBC Count: 17.5 10*3/uL — ABNORMAL HIGH (ref 4.0–10.5)
nRBC: 0 % (ref 0.0–0.2)

## 2018-08-02 LAB — CMP (CANCER CENTER ONLY)
ALT: 9 U/L (ref 0–44)
AST: 12 U/L — ABNORMAL LOW (ref 15–41)
Albumin: 4.7 g/dL (ref 3.5–5.0)
Alkaline Phosphatase: 124 U/L (ref 38–126)
Anion gap: 11 (ref 5–15)
BUN: 11 mg/dL (ref 6–20)
CO2: 25 mmol/L (ref 22–32)
Calcium: 11.3 mg/dL — ABNORMAL HIGH (ref 8.9–10.3)
Chloride: 101 mmol/L (ref 98–111)
Creatinine: 0.63 mg/dL (ref 0.44–1.00)
GFR, Est AFR Am: >60 mL/min (ref >60)
GFR, Estimated: >60 mL/min (ref >60)
Glucose, Bld: 131 mg/dL — ABNORMAL HIGH (ref 70–99)
Potassium: 3.5 mmol/L (ref 3.5–5.1)
Sodium: 137 mmol/L (ref 135–145)
Total Bilirubin: 0.5 mg/dL (ref 0.3–1.2)
Total Protein: 7.8 g/dL (ref 6.5–8.1)

## 2018-08-02 LAB — SAVE SMEAR(SSMR), FOR PROVIDER SLIDE REVIEW

## 2018-08-02 NOTE — Progress Notes (Signed)
Hematology and Oncology Follow Up Visit  Brianna Fletcher [id number] Jun 03, 1970 48 y.o. 08/02/2018   Principle Diagnosis:  Chronic myeloproliferative syndrome-Triple negative Sub-clinical hypothyroidism (+) p-ANCA  Current Therapy:   Peg-Interferon q weekly - q 4 week dosing - On hold due to cost Synthroid 0.050 mg po q day   Interim History: Brianna Fletcher is here today for follow-up.  Unfortunately, she does have a tough time right now.  She had a cousin and an uncle die of the coronavirus.  This is really been tough on her.  In addition, her mother fell and broke 2 bones in her left leg.  She is in a wheelchair.  She cannot have surgery for this.  She has a cast on her leg.  Brianna Fletcher really wants to go up to [place] to see her granddaughter.  She is 76 months old.  However, she cannot leave her mom.  She otherwise seems to be doing okay.  She has had no fever.  She has had no cough.  Is been no change in bowel or bladder habits.  Has had no rashes.  There is been no leg swelling.  Currently, her performance status is ECOG 1.     Medications:  Allergies as of 08/02/2018      Reactions   Lisinopril Cough, Other (See Comments)   Losartan Cough, Other (See Comments)   Lubiprostone Other (See Comments)   Made her sick Made her sick      Medication List       Accurate as of Aug 02, 2018 12:49 PM. If you have any questions, ask your nurse or doctor.        amLODipine 10 MG tablet Commonly known as:  NORVASC TAKE 1 TABLET BY MOUTH EVERY DAY   aspirin 81 MG tablet Take 81 mg by mouth daily.   chlorpheniramine-HYDROcodone 10-8 MG/5ML Suer Commonly known as:  Tussionex Pennkinetic ER Take 5 mLs by mouth at bedtime as needed for cough.   clotrimazole-betamethasone cream Commonly known as:  LOTRISONE APPLY 1 APPLICATION TOPICALLY 2 TIMES A DAY.   dicyclomine 10 MG capsule Commonly known as:  Bentyl Take 1 capsule (10 mg total) by mouth 4 (four) times daily -  before  meals and at bedtime.   doxepin 25 MG capsule Commonly known as:  SINEQUAN Take 2 capsules (50 mg total) by mouth at bedtime.   furosemide 20 MG tablet Commonly known as:  LASIX Take 1 tablet (20 mg total) by mouth daily.   Klor-Con M20 20 MEQ tablet Generic drug:  potassium chloride SA Take 20 mEq by mouth 2 (two) times daily.   levothyroxine 50 MCG tablet Commonly known as:  SYNTHROID TAKE 1 TABLET (50 MCG TOTAL) DAILY BEFORE BREAKFAST BY MOUTH.   omeprazole 20 MG capsule Commonly known as:  PRILOSEC Take 1 capsule (20 mg total) by mouth daily.   Pegasys 180 MCG/ML injection Generic drug:  peginterferon alfa-2a Inject 0.45 mLs (81 mcg total) into the skin every 14 (fourteen) days.   promethazine-codeine 6.25-10 MG/5ML syrup Commonly known as:  PHENERGAN with CODEINE Take 5 mLs by mouth every 8 (eight) hours as needed for cough.   promethazine-dextromethorphan 6.25-15 MG/5ML syrup Commonly known as:  PROMETHAZINE-DM Take 5-10 mLs by mouth every 8 (eight) hours as needed for cough.       Allergies:  Allergies  Allergen Reactions  . Lisinopril Cough and Other (See Comments)  . Losartan Cough and Other (See Comments)  . Lubiprostone Other (  See Comments)    Made her sick Made her sick    Past Medical History, Surgical history, Social history, and Family History were reviewed and updated.  Review of Systems: Review of Systems  Constitutional: Positive for malaise/fatigue.  HENT: Negative.   Eyes: Negative.   Respiratory: Negative.   Cardiovascular: Negative.   Gastrointestinal: Positive for diarrhea.  Genitourinary: Negative.   Musculoskeletal: Positive for myalgias.  Skin: Negative.   Neurological: Positive for dizziness.  Psychiatric/Behavioral: Negative.      Physical Exam:  weight is 236 lb 6.4 oz (107.2 kg). Her oral temperature is 97 F (36.1 C) (abnormal). Her blood pressure is 143/69 (abnormal) and her pulse is 72. Her respiration is 20 and  oxygen saturation is 100%.   Wt Readings from Last 3 Encounters:  08/02/18 236 lb 6.4 oz (107.2 kg)  06/06/18 241 lb (109.3 kg)  04/27/18 244 lb (110.7 kg)    Physical Exam Vitals signs reviewed.  HENT:     Head: Normocephalic and atraumatic.  Eyes:     Pupils: Pupils are equal, round, and reactive to light.  Neck:     Musculoskeletal: Normal range of motion.  Cardiovascular:     Rate and Rhythm: Normal rate and regular rhythm.     Heart sounds: Normal heart sounds.  Pulmonary:     Effort: Pulmonary effort is normal.     Breath sounds: Normal breath sounds.  Abdominal:     General: Bowel sounds are normal.     Palpations: Abdomen is soft.  Musculoskeletal: Normal range of motion.        General: No tenderness or deformity.  Lymphadenopathy:     Cervical: No cervical adenopathy.  Skin:    General: Skin is warm and dry.     Findings: No erythema or rash.  Neurological:     Mental Status: She is alert and oriented to person, place, and time.  Psychiatric:        Behavior: Behavior normal.        Thought Content: Thought content normal.        Judgment: Judgment normal.      Lab Results  Component Value Date   WBC 17.5 (H) 08/02/2018   HGB 13.7 08/02/2018   HCT 41.9 08/02/2018   MCV 91.9 08/02/2018   PLT 306 08/02/2018   Lab Results  Component Value Date   FERRITIN 64 06/06/2018   IRON 82 06/06/2018   TIBC 338 06/06/2018   UIBC 256 06/06/2018   IRONPCTSAT 24 06/06/2018   Lab Results  Component Value Date   RETICCTPCT 2.1 04/12/2017   RBC 4.56 08/02/2018   RETICCTABS 116.1 11/20/2013   No results found for: KPAFRELGTCHN, LAMBDASER, KAPLAMBRATIO No results found for: IGGSERUM, IGA, IGMSERUM No results found for: Odetta Pink, SPEI   Chemistry      Component Value Date/Time   NA 137 08/02/2018 1126   NA 140 02/08/2017 0925   NA 136 03/10/2016 1147   K 3.5 08/02/2018 1126   K 3.4 02/08/2017 0925    K 3.9 03/10/2016 1147   CL 101 08/02/2018 1126   CL 105 02/08/2017 0925   CO2 25 08/02/2018 1126   CO2 26 02/08/2017 0925   CO2 24 03/10/2016 1147   BUN 11 08/02/2018 1126   BUN 10 02/08/2017 0925   BUN 10.2 03/10/2016 1147   CREATININE 0.63 08/02/2018 1126   CREATININE 0.7 02/08/2017 0925   CREATININE 0.7 03/10/2016 1147  Component Value Date/Time   CALCIUM 11.3 (H) 08/02/2018 1126   CALCIUM 10.4 (H) 02/08/2017 0925   CALCIUM 10.7 (H) 03/10/2016 1147   ALKPHOS 124 08/02/2018 1126   ALKPHOS 106 (H) 02/08/2017 0925   ALKPHOS 132 03/10/2016 1147   AST 12 (L) 08/02/2018 1126   AST 12 03/10/2016 1147   ALT 9 08/02/2018 1126   ALT 16 02/08/2017 0925   ALT 8 03/10/2016 1147   BILITOT 0.5 08/02/2018 1126   BILITOT 0.63 03/10/2016 1147      Impression and Plan: Brianna Fletcher is a very pleasant 48 yo caucasian female with a triple negative myeloproliferative syndrome though all work up has been negative so far.   I will certainly very hard for her given that she has had death in the family and is now really busy with her mom.  Thankfully, her blood counts are holding steady.  About this.  I think we get her back in 3 months now.  This may be easier for her so time she comes back she will be able to go up to [place] and her mom will be a little more independent.  [name], MD 5/13/202012:49 PM

## 2018-08-03 LAB — LACTATE DEHYDROGENASE: LDH: 181 U/L (ref 98–192)

## 2018-08-24 ENCOUNTER — Telehealth: Payer: Self-pay | Admitting: Family Medicine

## 2018-08-24 ENCOUNTER — Other Ambulatory Visit: Payer: Self-pay | Admitting: *Deleted

## 2018-08-24 DIAGNOSIS — R600 Localized edema: Secondary | ICD-10-CM

## 2018-08-24 DIAGNOSIS — E041 Nontoxic single thyroid nodule: Secondary | ICD-10-CM

## 2018-08-24 DIAGNOSIS — D471 Chronic myeloproliferative disease: Secondary | ICD-10-CM

## 2018-08-24 MED ORDER — FUROSEMIDE 20 MG PO TABS: 20.0000 mg | ORAL_TABLET | Freq: Every day | ORAL | 0 refills | Status: DC

## 2018-08-24 MED ORDER — LEVOTHYROXINE SODIUM 50 MCG PO TABS: 50.0000 ug | ORAL_TABLET | Freq: Every day | ORAL | 0 refills | Status: DC

## 2018-08-24 NOTE — Telephone Encounter (Unsigned)
Copied from [place] [id number]. Topic: Quick Communication - Rx Refill/Question >> Aug 24, 2018 12:04 PM Alanda Slim E wrote: Medication: furosemide (LASIX) 20 MG tablet levothyroxine (SYNTHROID, LEVOTHROID) 50 MCG tablet   Has the patient contacted their pharmacy?  Yes - request sent with no response/ call pcp office   Preferred Pharmacy (with phone number or street name): [id number] [place], [address] [phone] (Phone) [phone] (Fax)    Agent: Please be advised that RX refills may take up to 3 business days. We ask that you follow-up with your pharmacy.

## 2018-08-25 ENCOUNTER — Telehealth: Payer: Self-pay

## 2018-08-25 NOTE — Telephone Encounter (Signed)
Copied from [place] [phone]. Topic: Appointment Scheduling - Scheduling Inquiry for Clinic >> Aug 24, 2018 12:09 PM Alanda Slim E wrote: Reason for CRM: Pt is interested in a covid-19 anti body testing / please advise

## 2018-08-29 NOTE — Telephone Encounter (Signed)
Mychart message sent.

## 2018-11-03 DIAGNOSIS — R69 Illness, unspecified: Secondary | ICD-10-CM | POA: Diagnosis not present

## 2018-11-03 DIAGNOSIS — F419 Anxiety disorder, unspecified: Secondary | ICD-10-CM | POA: Diagnosis not present

## 2018-11-07 DIAGNOSIS — R69 Illness, unspecified: Secondary | ICD-10-CM | POA: Diagnosis not present

## 2018-11-07 DIAGNOSIS — F419 Anxiety disorder, unspecified: Secondary | ICD-10-CM | POA: Diagnosis not present

## 2018-11-08 ENCOUNTER — Other Ambulatory Visit: Payer: Self-pay | Admitting: Family Medicine

## 2018-11-08 DIAGNOSIS — R600 Localized edema: Secondary | ICD-10-CM

## 2018-11-16 DIAGNOSIS — R69 Illness, unspecified: Secondary | ICD-10-CM | POA: Diagnosis not present

## 2018-11-16 DIAGNOSIS — F419 Anxiety disorder, unspecified: Secondary | ICD-10-CM | POA: Diagnosis not present

## 2018-11-20 ENCOUNTER — Inpatient Hospital Stay: Payer: Medicare HMO | Attending: Hematology & Oncology

## 2018-11-20 ENCOUNTER — Other Ambulatory Visit: Payer: Self-pay

## 2018-11-20 ENCOUNTER — Inpatient Hospital Stay (HOSPITAL_BASED_OUTPATIENT_CLINIC_OR_DEPARTMENT_OTHER): Payer: Medicare HMO | Admitting: Hematology & Oncology

## 2018-11-20 ENCOUNTER — Telehealth: Payer: Self-pay | Admitting: Hematology & Oncology

## 2018-11-20 ENCOUNTER — Encounter: Payer: Self-pay | Admitting: Hematology & Oncology

## 2018-11-20 VITALS — BP 144/78 | HR 60 | Temp 97.9°F | Resp 20 | Wt 236.1 lb

## 2018-11-20 DIAGNOSIS — E039 Hypothyroidism, unspecified: Secondary | ICD-10-CM | POA: Diagnosis not present

## 2018-11-20 DIAGNOSIS — Z79899 Other long term (current) drug therapy: Secondary | ICD-10-CM | POA: Diagnosis not present

## 2018-11-20 DIAGNOSIS — D471 Chronic myeloproliferative disease: Secondary | ICD-10-CM | POA: Insufficient documentation

## 2018-11-20 LAB — CBC WITH DIFFERENTIAL (CANCER CENTER ONLY)
Abs Immature Granulocytes: 0.22 10*3/uL — ABNORMAL HIGH (ref 0.00–0.07)
Basophils Absolute: 0.1 10*3/uL (ref 0.0–0.1)
Basophils Relative: 0 %
Eosinophils Absolute: 0.2 10*3/uL (ref 0.0–0.5)
Eosinophils Relative: 1 %
HCT: 41.9 % (ref 36.0–46.0)
Hemoglobin: 13.6 g/dL (ref 12.0–15.0)
Immature Granulocytes: 1 %
Lymphocytes Relative: 21 %
Lymphs Abs: 3.4 10*3/uL (ref 0.7–4.0)
MCH: 29.9 pg (ref 26.0–34.0)
MCHC: 32.5 g/dL (ref 30.0–36.0)
MCV: 92.1 fL (ref 80.0–100.0)
Monocytes Absolute: 0.9 10*3/uL (ref 0.1–1.0)
Monocytes Relative: 6 %
Neutro Abs: 11.3 10*3/uL — ABNORMAL HIGH (ref 1.7–7.7)
Neutrophils Relative %: 71 %
Platelet Count: 335 10*3/uL (ref 150–400)
RBC: 4.55 MIL/uL (ref 3.87–5.11)
RDW: 12.3 % (ref 11.5–15.5)
WBC Count: 16 10*3/uL — ABNORMAL HIGH (ref 4.0–10.5)
nRBC: 0 % (ref 0.0–0.2)

## 2018-11-20 LAB — SAVE SMEAR(SSMR), FOR PROVIDER SLIDE REVIEW

## 2018-11-20 LAB — CMP (CANCER CENTER ONLY)
ALT: 9 U/L (ref 0–44)
AST: 13 U/L — ABNORMAL LOW (ref 15–41)
Albumin: 4.4 g/dL (ref 3.5–5.0)
Alkaline Phosphatase: 122 U/L (ref 38–126)
Anion gap: 10 (ref 5–15)
BUN: 13 mg/dL (ref 6–20)
CO2: 25 mmol/L (ref 22–32)
Calcium: 10.9 mg/dL — ABNORMAL HIGH (ref 8.9–10.3)
Chloride: 102 mmol/L (ref 98–111)
Creatinine: 0.64 mg/dL (ref 0.44–1.00)
GFR, Est AFR Am: >60 mL/min
GFR, Estimated: >60 mL/min
Glucose, Bld: 100 mg/dL — ABNORMAL HIGH (ref 70–99)
Potassium: 3.9 mmol/L (ref 3.5–5.1)
Sodium: 137 mmol/L (ref 135–145)
Total Bilirubin: 0.7 mg/dL (ref 0.3–1.2)
Total Protein: 7.7 g/dL (ref 6.5–8.1)

## 2018-11-20 LAB — LACTATE DEHYDROGENASE: LDH: 143 U/L (ref 98–192)

## 2018-11-20 NOTE — Progress Notes (Signed)
Hematology and Oncology Follow Up Visit  Brianna Fletcher [id number] 1971-02-15 48 y.o. 11/20/2018   Principle Diagnosis:  Chronic myeloproliferative syndrome-Triple negative Sub-clinical hypothyroidism (+) p-ANCA  Current Therapy:   Peg-Interferon q weekly - q 4 week dosing - On hold due to cost Synthroid 0.050 mg po q day   Interim History: Brianna Fletcher is here today for follow-up.  Things really have been tough on her.  She now has to deal with her mom's worsening dementia.  She really has gotten bad.  She does not want anything to do with Ms. Nicklow.  Ms. Sontag is trying to keep her from driving.  Unfortunately, her mom has a set of keys and is driving.  This clearly is dangerous to her mom and to others.  Ms. Rennison has not been able to make it up to [place] to see her granddaughter.  This really has bothered her and has really taken its toll on her.  She has had no problems with pain.  She does have some arthritic issues.  She does have some intermittent stasis dermatitis in her lower legs.  She has had no cough.  There is no problems with fever.  She has had no bleeding.  She has had no change in bowel or bladder habits.  Currently, her performance status is ECOG 1.     Medications:  Allergies as of 11/20/2018      Reactions   Lisinopril Cough, Other (See Comments)   Losartan Cough, Other (See Comments)   Lubiprostone Other (See Comments)   Made her sick Made her sick      Medication List       Accurate as of November 20, 2018  8:34 AM. If you have any questions, ask your nurse or doctor.        amLODipine 10 MG tablet Commonly known as: NORVASC TAKE 1 TABLET BY MOUTH EVERY DAY   aspirin 81 MG tablet Take 81 mg by mouth daily.   chlorpheniramine-HYDROcodone 10-8 MG/5ML Suer Commonly known as: Tussionex Pennkinetic ER Take 5 mLs by mouth at bedtime as needed for cough.   clotrimazole-betamethasone cream Commonly known as: LOTRISONE APPLY 1  APPLICATION TOPICALLY 2 TIMES A DAY.   dicyclomine 10 MG capsule Commonly known as: Bentyl Take 1 capsule (10 mg total) by mouth 4 (four) times daily -  before meals and at bedtime.   doxepin 25 MG capsule Commonly known as: SINEQUAN Take 2 capsules (50 mg total) by mouth at bedtime.   furosemide 20 MG tablet Commonly known as: LASIX TAKE 1 TABLET (20 MG TOTAL) BY MOUTH DAILY. NEEDS OV   Klor-Con M20 20 MEQ tablet Generic drug: potassium chloride SA Take 20 mEq by mouth 2 (two) times daily.   levothyroxine 50 MCG tablet Commonly known as: SYNTHROID Take 1 tablet (50 mcg total) by mouth daily before breakfast. Needs ov   omeprazole 20 MG capsule Commonly known as: PRILOSEC Take 1 capsule (20 mg total) by mouth daily.   Pegasys 180 MCG/ML injection Generic drug: peginterferon alfa-2a Inject 0.45 mLs (81 mcg total) into the skin every 14 (fourteen) days.   promethazine-codeine 6.25-10 MG/5ML syrup Commonly known as: PHENERGAN with CODEINE Take 5 mLs by mouth every 8 (eight) hours as needed for cough.   promethazine-dextromethorphan 6.25-15 MG/5ML syrup Commonly known as: PROMETHAZINE-DM Take 5-10 mLs by mouth every 8 (eight) hours as needed for cough.       Allergies:  Allergies  Allergen Reactions  . Lisinopril Cough  and Other (See Comments)  . Losartan Cough and Other (See Comments)  . Lubiprostone Other (See Comments)    Made her sick Made her sick    Past Medical History, Surgical history, Social history, and Family History were reviewed and updated.  Review of Systems: Review of Systems  Constitutional: Positive for malaise/fatigue.  HENT: Negative.   Eyes: Negative.   Respiratory: Negative.   Cardiovascular: Negative.   Gastrointestinal: Positive for diarrhea.  Genitourinary: Negative.   Musculoskeletal: Positive for myalgias.  Skin: Negative.   Neurological: Positive for dizziness.  Psychiatric/Behavioral: Negative.      Physical Exam:  weight  is 236 lb 1.9 oz (107.1 kg). Her oral temperature is 97.9 F (36.6 C). Her blood pressure is 144/78 (abnormal) and her pulse is 60. Her respiration is 20 and oxygen saturation is 100%.   Wt Readings from Last 3 Encounters:  11/20/18 236 lb 1.9 oz (107.1 kg)  08/02/18 236 lb 6.4 oz (107.2 kg)  06/06/18 241 lb (109.3 kg)    Physical Exam Vitals signs reviewed.  HENT:     Head: Normocephalic and atraumatic.  Eyes:     Pupils: Pupils are equal, round, and reactive to light.  Neck:     Musculoskeletal: Normal range of motion.  Cardiovascular:     Rate and Rhythm: Normal rate and regular rhythm.     Heart sounds: Normal heart sounds.  Pulmonary:     Effort: Pulmonary effort is normal.     Breath sounds: Normal breath sounds.  Abdominal:     General: Bowel sounds are normal.     Palpations: Abdomen is soft.  Musculoskeletal: Normal range of motion.        General: No tenderness or deformity.  Lymphadenopathy:     Cervical: No cervical adenopathy.  Skin:    General: Skin is warm and dry.     Findings: No erythema or rash.  Neurological:     Mental Status: She is alert and oriented to person, place, and time.  Psychiatric:        Behavior: Behavior normal.        Thought Content: Thought content normal.        Judgment: Judgment normal.      Lab Results  Component Value Date   WBC 16.0 (H) 11/20/2018   HGB 13.6 11/20/2018   HCT 41.9 11/20/2018   MCV 92.1 11/20/2018   PLT 335 11/20/2018   Lab Results  Component Value Date   FERRITIN 64 06/06/2018   IRON 82 06/06/2018   TIBC 338 06/06/2018   UIBC 256 06/06/2018   IRONPCTSAT 24 06/06/2018   Lab Results  Component Value Date   RETICCTPCT 2.1 04/12/2017   RBC 4.55 11/20/2018   RETICCTABS 116.1 11/20/2013   No results found for: KPAFRELGTCHN, LAMBDASER, KAPLAMBRATIO No results found for: IGGSERUM, IGA, IGMSERUM No results found for: Odetta Pink, SPEI    Chemistry      Component Value Date/Time   NA 137 08/02/2018 1126   NA 140 02/08/2017 0925   NA 136 03/10/2016 1147   K 3.5 08/02/2018 1126   K 3.4 02/08/2017 0925   K 3.9 03/10/2016 1147   CL 101 08/02/2018 1126   CL 105 02/08/2017 0925   CO2 25 08/02/2018 1126   CO2 26 02/08/2017 0925   CO2 24 03/10/2016 1147   BUN 11 08/02/2018 1126   BUN 10 02/08/2017 0925   BUN 10.2 03/10/2016 1147   CREATININE  0.63 08/02/2018 1126   CREATININE 0.7 02/08/2017 0925   CREATININE 0.7 03/10/2016 1147      Component Value Date/Time   CALCIUM 11.3 (H) 08/02/2018 1126   CALCIUM 10.4 (H) 02/08/2017 0925   CALCIUM 10.7 (H) 03/10/2016 1147   ALKPHOS 124 08/02/2018 1126   ALKPHOS 106 (H) 02/08/2017 0925   ALKPHOS 132 03/10/2016 1147   AST 12 (L) 08/02/2018 1126   AST 12 03/10/2016 1147   ALT 9 08/02/2018 1126   ALT 16 02/08/2017 0925   ALT 8 03/10/2016 1147   BILITOT 0.5 08/02/2018 1126   BILITOT 0.63 03/10/2016 1147      Impression and Plan: Ms. Shanahan is a very pleasant 48 yo caucasian female with a triple negative myeloproliferative syndrome though all work up has been negative so far.   I am happy that her blood counts really have been good.  Her white cell count really has not changed while she has been off the interferon.  We do not make any changes for right now.  I looked at her blood smear.  I do not see anything that looked suspicious for any real "active" disease.  I will plan to get her back in another couple months.  I spent about 30 minutes with her today.  This was a little bit complex given the fact that she is having the issues with her mom and is under a lot of stress.   [name], MD 8/31/20208:34 AM

## 2018-11-20 NOTE — Telephone Encounter (Signed)
lmom to inform patient of Oct appts per 8/31 LOS

## 2018-11-21 ENCOUNTER — Other Ambulatory Visit: Payer: Self-pay | Admitting: Family Medicine

## 2018-11-21 DIAGNOSIS — I10 Essential (primary) hypertension: Secondary | ICD-10-CM

## 2018-11-21 MED ORDER — AMLODIPINE BESYLATE 10 MG PO TABS: 10.0000 mg | ORAL_TABLET | Freq: Every day | ORAL | 0 refills | Status: DC

## 2018-11-21 NOTE — Telephone Encounter (Signed)
Copied from [place] [phone]. Topic: Quick Communication - Rx Refill/Question >> Nov 21, 2018  5:03 PM Mcneil, Ja-Kwan wrote: Medication: amLODipine (NORVASC) 10 MG tablet  Has the patient contacted their pharmacy? yes   Preferred Pharmacy (with phone number [facility] street name): [id number] [place], [address] [phone] (Phone)  [phone] (Fax)  Agent: Please be advised that RX refills may take up to 3 business days. We ask that you follow-up with your pharmacy.

## 2018-11-22 DIAGNOSIS — F419 Anxiety disorder, unspecified: Secondary | ICD-10-CM | POA: Diagnosis not present

## 2018-11-22 DIAGNOSIS — R69 Illness, unspecified: Secondary | ICD-10-CM | POA: Diagnosis not present

## 2018-11-24 ENCOUNTER — Encounter: Payer: Self-pay | Admitting: Hematology & Oncology

## 2018-11-27 ENCOUNTER — Other Ambulatory Visit: Payer: Self-pay | Admitting: Family Medicine

## 2018-11-27 DIAGNOSIS — E041 Nontoxic single thyroid nodule: Secondary | ICD-10-CM

## 2018-11-27 DIAGNOSIS — D471 Chronic myeloproliferative disease: Secondary | ICD-10-CM

## 2018-11-29 DIAGNOSIS — R69 Illness, unspecified: Secondary | ICD-10-CM | POA: Diagnosis not present

## 2018-12-08 ENCOUNTER — Other Ambulatory Visit: Payer: Self-pay

## 2018-12-08 ENCOUNTER — Ambulatory Visit (INDEPENDENT_AMBULATORY_CARE_PROVIDER_SITE_OTHER): Payer: Medicare HMO | Admitting: Family Medicine

## 2018-12-08 ENCOUNTER — Encounter: Payer: Self-pay | Admitting: Family Medicine

## 2018-12-08 VITALS — BP 118/90 | HR 58 | Temp 97.6°F | Resp 18 | Ht 65.0 in | Wt 237.2 lb

## 2018-12-08 DIAGNOSIS — E041 Nontoxic single thyroid nodule: Secondary | ICD-10-CM | POA: Diagnosis not present

## 2018-12-08 DIAGNOSIS — Z20822 Contact with and (suspected) exposure to covid-19: Secondary | ICD-10-CM

## 2018-12-08 DIAGNOSIS — K219 Gastro-esophageal reflux disease without esophagitis: Secondary | ICD-10-CM

## 2018-12-08 DIAGNOSIS — E032 Hypothyroidism due to medicaments and other exogenous substances: Secondary | ICD-10-CM | POA: Diagnosis not present

## 2018-12-08 DIAGNOSIS — I10 Essential (primary) hypertension: Secondary | ICD-10-CM

## 2018-12-08 DIAGNOSIS — Z20828 Contact with and (suspected) exposure to other viral communicable diseases: Secondary | ICD-10-CM

## 2018-12-08 DIAGNOSIS — Z23 Encounter for immunization: Secondary | ICD-10-CM | POA: Diagnosis not present

## 2018-12-08 DIAGNOSIS — D471 Chronic myeloproliferative disease: Secondary | ICD-10-CM

## 2018-12-08 DIAGNOSIS — R600 Localized edema: Secondary | ICD-10-CM

## 2018-12-08 DIAGNOSIS — E785 Hyperlipidemia, unspecified: Secondary | ICD-10-CM

## 2018-12-08 LAB — COMPREHENSIVE METABOLIC PANEL
ALT: 9 U/L (ref 0–35)
AST: 14 U/L (ref 0–37)
Albumin: 4.4 g/dL (ref 3.5–5.2)
Alkaline Phosphatase: 125 U/L — ABNORMAL HIGH (ref 39–117)
BUN: 16 mg/dL (ref 6–23)
CO2: 25 mEq/L (ref 19–32)
Calcium: 11.1 mg/dL — ABNORMAL HIGH (ref 8.4–10.5)
Chloride: 102 mEq/L (ref 96–112)
Creatinine, Ser: 0.61 mg/dL (ref 0.40–1.20)
GFR: 103.53 mL/min (ref >60.00)
Glucose, Bld: 95 mg/dL (ref 70–99)
Potassium: 3.8 mEq/L (ref 3.5–5.1)
Sodium: 137 mEq/L (ref 135–145)
Total Bilirubin: 0.5 mg/dL (ref 0.2–1.2)
Total Protein: 7.7 g/dL (ref 6.0–8.3)

## 2018-12-08 LAB — SARS-COV-2 IGG: SARS-COV-2 IgG: 0.04

## 2018-12-08 LAB — LIPID PANEL
Cholesterol: 204 mg/dL — ABNORMAL HIGH (ref 0–200)
HDL: 41.9 mg/dL (ref >39.00)
LDL Cholesterol: 126 mg/dL — ABNORMAL HIGH (ref 0–99)
NonHDL: 162.56
Total CHOL/HDL Ratio: 5
Triglycerides: 184 mg/dL — ABNORMAL HIGH (ref 0.0–149.0)
VLDL: 36.8 mg/dL (ref 0.0–40.0)

## 2018-12-08 LAB — TSH: TSH: 0.64 u[IU]/mL (ref 0.35–4.50)

## 2018-12-08 MED ORDER — OMEPRAZOLE 20 MG PO CPDR: 20.0000 mg | DELAYED_RELEASE_CAPSULE | Freq: Every day | ORAL | 3 refills | Status: DC

## 2018-12-08 MED ORDER — FUROSEMIDE 20 MG PO TABS: 20.0000 mg | ORAL_TABLET | Freq: Every day | ORAL | 0 refills | Status: DC

## 2018-12-08 MED ORDER — LEVOTHYROXINE SODIUM 50 MCG PO TABS: 50.0000 ug | ORAL_TABLET | Freq: Every day | ORAL | 0 refills | Status: DC

## 2018-12-08 MED ORDER — AMLODIPINE BESYLATE 10 MG PO TABS: 10.0000 mg | ORAL_TABLET | Freq: Every day | ORAL | 0 refills | Status: DC

## 2018-12-08 NOTE — Assessment & Plan Note (Signed)
Per hematology 

## 2018-12-08 NOTE — Assessment & Plan Note (Signed)
Encouraged heart healthy diet, increase exercise, avoid trans fats, consider a krill oil cap daily 

## 2018-12-08 NOTE — Assessment & Plan Note (Signed)
Well controlled, no changes to meds. Encouraged heart healthy diet such as the DASH diet and exercise as tolerated.  °

## 2018-12-08 NOTE — Assessment & Plan Note (Signed)
Check labs today.

## 2018-12-08 NOTE — Progress Notes (Signed)
Patient ID: Brianna Fletcher, female    DOB: 04/29/1970  Age: 48 y.o. MRN: [id number]    Subjective:  Subjective  HPI Brianna Fletcher presents for f/u htn , thyroid and labs   Pt has no complaints   Review of Systems  Constitutional: Negative for appetite change, diaphoresis, fatigue and unexpected weight change.  Eyes: Negative for pain, redness and visual disturbance.  Respiratory: Negative for cough, chest tightness, shortness of breath and wheezing.   Cardiovascular: Negative for chest pain, palpitations and leg swelling.  Endocrine: Negative for cold intolerance, heat intolerance, polydipsia, polyphagia and polyuria.  Genitourinary: Negative for difficulty urinating, dysuria and frequency.  Neurological: Negative for dizziness, light-headedness, numbness and headaches.    History Past Medical History:  Diagnosis Date  . Arthritis   . Blood dyscrasia    produces too many WBCs  . Chronic neutrophilia 11/15/2012  . Family history of heart disease    mother had stents placed in her 62s and had myocardial infarctions  . Glaucoma   . Hypertension   . Leukocytosis, unspecified 11/15/2012  . Monocytosis 11/15/2012  . Myeloproliferative disorder ([facility]) 03/08/2013  . Precordial chest pain   . Pruritus 11/15/2012  . Radial artery occlusion, right (HCC)    status post  right radial artery cardiac catheterization  . Vaginal delivery 1986, 2000    She has a past surgical history that includes Tubal ligation (2000); Eye surgery; Cardiac catheterization (N/A, 10/03/2014); Dilatation & currettage/hysteroscopy with novasure ablation (N/A, 11/08/2014); and Laparoscopic vaginal hysterectomy with salpingectomy (Bilateral, 03/05/2015).   Her family history includes Cancer in her father; Dementia in her maternal grandmother; Diabetes in her father; Heart disease in her cousin, maternal grandmother, and mother; Heart disease (age of onset: 17) in her maternal grandfather; Hyperlipidemia in her mother;  Hypertension in her mother.She reports that she has been smoking cigarettes. She started smoking about 4 years ago. She has smoked for the past 0.00 years. She has never used smokeless tobacco. She reports current alcohol use of about 6.0 standard drinks of alcohol per week. She reports that she does not use drugs.  Current Outpatient Medications on File Prior to Visit  Medication Sig Dispense Refill  . aspirin 81 MG tablet Take 81 mg by mouth daily.    . clotrimazole-betamethasone (LOTRISONE) cream APPLY 1 APPLICATION TOPICALLY 2 TIMES A DAY. 30 g 0  . doxepin (SINEQUAN) 25 MG capsule Take 2 capsules (50 mg total) by mouth at bedtime. 60 capsule 3  . PEGASYS 180 MCG/ML injection Inject 0.45 mLs (81 mcg total) into the skin every 14 (fourteen) days. 2 mL 3  . chlorpheniramine-HYDROcodone (TUSSIONEX PENNKINETIC ER) 10-8 MG/5ML SUER Take 5 mLs by mouth at bedtime as needed for cough. (Patient not taking: Reported on 12/08/2018) 140 mL 0  . dicyclomine (BENTYL) 10 MG capsule Take 1 capsule (10 mg total) by mouth 4 (four) times daily -  before meals and at bedtime. (Patient not taking: Reported on 12/08/2018) 40 capsule 0  . KLOR-CON M20 20 MEQ tablet Take 20 mEq by mouth 2 (two) times daily.  1  . promethazine-codeine (PHENERGAN WITH CODEINE) 6.25-10 MG/5ML syrup Take 5 mLs by mouth every 8 (eight) hours as needed for cough. (Patient not taking: Reported on 12/08/2018) 120 mL 0  . promethazine-dextromethorphan (PROMETHAZINE-DM) 6.25-15 MG/5ML syrup Take 5-10 mLs by mouth every 8 (eight) hours as needed for cough. (Patient not taking: Reported on 12/08/2018) 150 mL 0   Current Facility-Administered Medications on File Prior to Visit  Medication Dose Route Frequency Provider Last Rate Last Dose  . acetaminophen (TYLENOL) tablet 650 mg  650 mg Oral Once [name], MD      . peginterferon alfa-2a (PEGASYS) injection 81 mcg  81 mcg Subcutaneous Weekly [name], MD   81 mcg at 04/26/14 1530      Objective:  Objective  Physical Exam Vitals signs and nursing note reviewed.  Constitutional:      Appearance: She is well-developed.  HENT:     Head: Normocephalic and atraumatic.  Eyes:     Conjunctiva/sclera: Conjunctivae normal.  Neck:     Musculoskeletal: Normal range of motion and neck supple.     Thyroid: No thyromegaly.     Vascular: No carotid bruit or JVD.  Cardiovascular:     Rate and Rhythm: Normal rate and regular rhythm.     Heart sounds: Normal heart sounds. No murmur.  Pulmonary:     Effort: Pulmonary effort is normal. No respiratory distress.     Breath sounds: Normal breath sounds. No wheezing or rales.  Chest:     Chest wall: No tenderness.  Neurological:     Mental Status: She is alert and oriented to person, place, and time.    BP 118/90 (BP Location: Right Arm, Patient Position: Sitting, Cuff Size: Normal)   Pulse (!) 58   Temp 97.6 F (36.4 C) (Temporal)   Resp 18   Ht 5\' 5"  (1.651 m)   Wt 237 lb 3.2 oz (107.6 kg)   SpO2 96%   BMI 39.47 kg/m  Wt Readings from Last 3 Encounters:  12/08/18 237 lb 3.2 oz (107.6 kg)  11/20/18 236 lb 1.9 oz (107.1 kg)  08/02/18 236 lb 6.4 oz (107.2 kg)     Lab Results  Component Value Date   WBC 16.0 (H) 11/20/2018   HGB 13.6 11/20/2018   HCT 41.9 11/20/2018   PLT 335 11/20/2018   GLUCOSE 100 (H) 11/20/2018   CHOL 188 12/08/2017   TRIG 234.0 (H) 12/08/2017   HDL 44.70 12/08/2017   LDLDIRECT 120.0 12/08/2017   LDLCALC 112 (H) 12/09/2016   ALT 9 11/20/2018   AST 13 (L) 11/20/2018   NA 137 11/20/2018   K 3.9 11/20/2018   CL 102 11/20/2018   CREATININE 0.64 11/20/2018   BUN 13 11/20/2018   CO2 25 11/20/2018   TSH 1.629 04/27/2018   INR 0.9 05/06/2016   MICROALBUR <0.7 04/23/2016    Dg Chest 2 View  Result Date: 04/27/2018 CLINICAL DATA:  History of pneumonia EXAM: CHEST - 2 VIEW COMPARISON:  04/07/2018 FINDINGS: Normal heart size. Airspace disease at the right base has nearly resolved. Minimal  residual hazy opacity persists. No pneumothorax. No pleural effusion. IMPRESSION: Improved right basilar pneumonia Electronically Signed   By: Marybelle Killings M.D.   On: 04/27/2018 16:23     Assessment & Plan:  Plan  I am having Brianna Fletcher maintain her aspirin, doxepin, Pegasys, Klor-Con M20, clotrimazole-betamethasone, dicyclomine, chlorpheniramine-HYDROcodone, promethazine-codeine, promethazine-dextromethorphan, amLODipine, furosemide, levothyroxine, and omeprazole.  Meds ordered this encounter  Medications  . amLODipine (NORVASC) 10 MG tablet    Sig: Take 1 tablet (10 mg total) by mouth daily.    Dispense:  30 tablet    Refill:  0    Patient needs appt for further refills  . furosemide (LASIX) 20 MG tablet    Sig: Take 1 tablet (20 mg total) by mouth daily. Needs ov    Dispense:  90 tablet  Refill:  0  . levothyroxine (SYNTHROID) 50 MCG tablet    Sig: Take 1 tablet (50 mcg total) by mouth daily before breakfast. Needs ov    Dispense:  90 tablet    Refill:  0  . omeprazole (PRILOSEC) 20 MG capsule    Sig: Take 1 capsule (20 mg total) by mouth daily.    Dispense:  90 capsule    Refill:  3    Problem List Items Addressed This Visit      Unprioritized   HTN (hypertension)    Well controlled, no changes to meds. Encouraged heart healthy diet such as the DASH diet and exercise as tolerated.       Relevant Medications   amLODipine (NORVASC) 10 MG tablet   furosemide (LASIX) 20 MG tablet   Other Relevant Orders   Lipid panel   TSH   Comprehensive metabolic panel   Hyperlipidemia    Encouraged heart healthy diet, increase exercise, avoid trans fats, consider a krill oil cap daily      Relevant Medications   amLODipine (NORVASC) 10 MG tablet   furosemide (LASIX) 20 MG tablet   Hypothyroidism due to non-medication exogenous substances    Check labs today      Relevant Medications   levothyroxine (SYNTHROID) 50 MCG tablet   Morbid obesity (HCC)   Relevant Orders    Amb Ref to Medical Weight Management   Myeloproliferative disorder (HCC) (Chronic)    Per hematology      Relevant Medications   levothyroxine (SYNTHROID) 50 MCG tablet   Right thyroid nodule   Relevant Medications   levothyroxine (SYNTHROID) 50 MCG tablet   Other Relevant Orders   TSH    Other Visit Diagnoses    Exposure to Covid-19 Virus    -  Primary   Relevant Orders   SARS-COV-2 IgG   Lower extremity edema       Relevant Medications   furosemide (LASIX) 20 MG tablet   Gastroesophageal reflux disease, esophagitis presence not specified       Relevant Medications   omeprazole (PRILOSEC) 20 MG capsule   Need for influenza vaccination       Relevant Orders   Flu Vaccine QUAD 36+ mos IM (Completed)      Follow-up: Return in about 6 months (around 06/07/2019), or if symptoms worsen or fail to improve, for annual exam, fasting.  Ann Held, DO

## 2018-12-08 NOTE — Patient Instructions (Signed)

## 2018-12-13 DIAGNOSIS — R69 Illness, unspecified: Secondary | ICD-10-CM | POA: Diagnosis not present

## 2018-12-13 DIAGNOSIS — F419 Anxiety disorder, unspecified: Secondary | ICD-10-CM | POA: Diagnosis not present

## 2018-12-15 ENCOUNTER — Other Ambulatory Visit: Payer: Self-pay | Admitting: Family Medicine

## 2018-12-15 DIAGNOSIS — E785 Hyperlipidemia, unspecified: Secondary | ICD-10-CM

## 2018-12-29 DIAGNOSIS — Z20828 Contact with and (suspected) exposure to other viral communicable diseases: Secondary | ICD-10-CM | POA: Diagnosis not present

## 2018-12-29 DIAGNOSIS — J069 Acute upper respiratory infection, unspecified: Secondary | ICD-10-CM | POA: Diagnosis not present

## 2019-01-15 ENCOUNTER — Other Ambulatory Visit: Payer: Self-pay

## 2019-01-15 ENCOUNTER — Inpatient Hospital Stay: Payer: Medicare HMO | Attending: Hematology & Oncology | Admitting: Hematology & Oncology

## 2019-01-15 ENCOUNTER — Encounter: Payer: Self-pay | Admitting: Hematology & Oncology

## 2019-01-15 ENCOUNTER — Inpatient Hospital Stay: Payer: Medicare HMO

## 2019-01-15 VITALS — BP 142/71 | HR 58 | Temp 97.5°F | Resp 18 | Wt 232.0 lb

## 2019-01-15 DIAGNOSIS — E039 Hypothyroidism, unspecified: Secondary | ICD-10-CM | POA: Insufficient documentation

## 2019-01-15 DIAGNOSIS — D471 Chronic myeloproliferative disease: Secondary | ICD-10-CM

## 2019-01-15 LAB — CBC WITH DIFFERENTIAL (CANCER CENTER ONLY)
Abs Immature Granulocytes: 0.2 10*3/uL — ABNORMAL HIGH (ref 0.00–0.07)
Basophils Absolute: 0.1 10*3/uL (ref 0.0–0.1)
Basophils Relative: 0 %
Eosinophils Absolute: 0.2 10*3/uL (ref 0.0–0.5)
Eosinophils Relative: 1 %
HCT: 42.3 % (ref 36.0–46.0)
Hemoglobin: 13.5 g/dL (ref 12.0–15.0)
Immature Granulocytes: 1 %
Lymphocytes Relative: 19 %
Lymphs Abs: 3.2 10*3/uL (ref 0.7–4.0)
MCH: 30 pg (ref 26.0–34.0)
MCHC: 31.9 g/dL (ref 30.0–36.0)
MCV: 94 fL (ref 80.0–100.0)
Monocytes Absolute: 1 10*3/uL (ref 0.1–1.0)
Monocytes Relative: 6 %
Neutro Abs: 11.9 10*3/uL — ABNORMAL HIGH (ref 1.7–7.7)
Neutrophils Relative %: 73 %
Platelet Count: 337 10*3/uL (ref 150–400)
RBC: 4.5 MIL/uL (ref 3.87–5.11)
RDW: 12.5 % (ref 11.5–15.5)
WBC Count: 16.6 10*3/uL — ABNORMAL HIGH (ref 4.0–10.5)
nRBC: 0 % (ref 0.0–0.2)

## 2019-01-15 LAB — CMP (CANCER CENTER ONLY)
ALT: 11 U/L (ref 0–44)
AST: 16 U/L (ref 15–41)
Albumin: 4.5 g/dL (ref 3.5–5.0)
Alkaline Phosphatase: 112 U/L (ref 38–126)
Anion gap: 7 (ref 5–15)
BUN: 10 mg/dL (ref 6–20)
CO2: 28 mmol/L (ref 22–32)
Calcium: 10.5 mg/dL — ABNORMAL HIGH (ref 8.9–10.3)
Chloride: 104 mmol/L (ref 98–111)
Creatinine: 0.6 mg/dL (ref 0.44–1.00)
GFR, Est AFR Am: >60 mL/min (ref >60)
GFR, Estimated: >60 mL/min (ref >60)
Glucose, Bld: 95 mg/dL (ref 70–99)
Potassium: 4 mmol/L (ref 3.5–5.1)
Sodium: 139 mmol/L (ref 135–145)
Total Bilirubin: 0.6 mg/dL (ref 0.3–1.2)
Total Protein: 7.3 g/dL (ref 6.5–8.1)

## 2019-01-15 LAB — LACTATE DEHYDROGENASE: LDH: 201 U/L — ABNORMAL HIGH (ref 98–192)

## 2019-01-15 NOTE — Progress Notes (Signed)
Hematology and Oncology Follow Up Visit  RUDENE ANSELL [id number] 1970-12-26 48 y.o. 01/15/2019   Principle Diagnosis:  Chronic myeloproliferative syndrome-Triple negative Sub-clinical hypothyroidism (+) p-ANCA  Current Therapy:   Peg-Interferon q weekly - q 4 week dosing - On hold due to cost Synthroid 0.050 mg po q day   Interim History: Brianna Fletcher is here today for follow-up.  So far, everything is going quite well for her.  Her son, girlfriend and granddaughter came down from [place] and just left.  That a wonderful time.  She is hoping to go up to [place] over the Christmas holiday.  I told her that I had no problems with her going up.  I thought it would be safe for her to go up and travel by plane.  Otherwise, her health is doing fairly well.  She is having some issues with her arthritis.  She is having some intermittent swelling which is localized to the ankle of her left foot.  She is not sure when she sees the rheumatologist again.  She has had no problems with fever.  She has had no rashes.  She has had no cough.  There is been no change in bowel or bladder habits.  Overall, her performance status is ECOG 1.     Medications:  Allergies as of 01/15/2019      Reactions   Lisinopril Cough, Other (See Comments)   Losartan Cough, Other (See Comments)   Lubiprostone Other (See Comments)   Made her sick Made her sick      Medication List       Accurate as of January 15, 2019  8:31 AM. If you have any questions, ask your nurse or doctor.        amLODipine 10 MG tablet Commonly known as: NORVASC Take 1 tablet (10 mg total) by mouth daily.   aspirin 81 MG tablet Take 81 mg by mouth daily.   chlorpheniramine-HYDROcodone 10-8 MG/5ML Suer Commonly known as: Tussionex Pennkinetic ER Take 5 mLs by mouth at bedtime as needed for cough.   clotrimazole-betamethasone cream Commonly known as: LOTRISONE APPLY 1 APPLICATION TOPICALLY 2 TIMES A DAY.    dicyclomine 10 MG capsule Commonly known as: Bentyl Take 1 capsule (10 mg total) by mouth 4 (four) times daily -  before meals and at bedtime.   doxepin 25 MG capsule Commonly known as: SINEQUAN Take 2 capsules (50 mg total) by mouth at bedtime.   furosemide 20 MG tablet Commonly known as: LASIX Take 1 tablet (20 mg total) by mouth daily. Needs ov   Klor-Con M20 20 MEQ tablet Generic drug: potassium chloride SA Take 20 mEq by mouth 2 (two) times daily.   levothyroxine 50 MCG tablet Commonly known as: SYNTHROID Take 1 tablet (50 mcg total) by mouth daily before breakfast. Needs ov   omeprazole 20 MG capsule Commonly known as: PRILOSEC Take 1 capsule (20 mg total) by mouth daily.   Pegasys 180 MCG/ML injection Generic drug: peginterferon alfa-2a Inject 0.45 mLs (81 mcg total) into the skin every 14 (fourteen) days.   promethazine-codeine 6.25-10 MG/5ML syrup Commonly known as: PHENERGAN with CODEINE Take 5 mLs by mouth every 8 (eight) hours as needed for cough.   promethazine-dextromethorphan 6.25-15 MG/5ML syrup Commonly known as: PROMETHAZINE-DM Take 5-10 mLs by mouth every 8 (eight) hours as needed for cough.       Allergies:  Allergies  Allergen Reactions  . Lisinopril Cough and Other (See Comments)  . Losartan Cough  and Other (See Comments)  . Lubiprostone Other (See Comments)    Made her sick Made her sick    Past Medical History, Surgical history, Social history, and Family History were reviewed and updated.  Review of Systems: Review of Systems  Constitutional: Positive for malaise/fatigue.  HENT: Negative.   Eyes: Negative.   Respiratory: Negative.   Cardiovascular: Negative.   Gastrointestinal: Positive for diarrhea.  Genitourinary: Negative.   Musculoskeletal: Positive for myalgias.  Skin: Negative.   Neurological: Positive for dizziness.  Psychiatric/Behavioral: Negative.      Physical Exam:  weight is 232 lb (105.2 kg). Her oral  temperature is 97.5 F (36.4 C) (abnormal). Her blood pressure is 142/71 (abnormal) and her pulse is 58 (abnormal). Her respiration is 18 and oxygen saturation is 100%.   Wt Readings from Last 3 Encounters:  01/15/19 232 lb (105.2 kg)  12/08/18 237 lb 3.2 oz (107.6 kg)  11/20/18 236 lb 1.9 oz (107.1 kg)    Physical Exam Vitals signs reviewed.  HENT:     Head: Normocephalic and atraumatic.  Eyes:     Pupils: Pupils are equal, round, and reactive to light.  Neck:     Musculoskeletal: Normal range of motion.  Cardiovascular:     Rate and Rhythm: Normal rate and regular rhythm.     Heart sounds: Normal heart sounds.  Pulmonary:     Effort: Pulmonary effort is normal.     Breath sounds: Normal breath sounds.  Abdominal:     General: Bowel sounds are normal.     Palpations: Abdomen is soft.  Musculoskeletal: Normal range of motion.        General: No tenderness or deformity.  Lymphadenopathy:     Cervical: No cervical adenopathy.  Skin:    General: Skin is warm and dry.     Findings: No erythema or rash.  Neurological:     Mental Status: She is alert and oriented to person, place, and time.  Psychiatric:        Behavior: Behavior normal.        Thought Content: Thought content normal.        Judgment: Judgment normal.      Lab Results  Component Value Date   WBC 16.6 (H) 01/15/2019   HGB 13.5 01/15/2019   HCT 42.3 01/15/2019   MCV 94.0 01/15/2019   PLT 337 01/15/2019   Lab Results  Component Value Date   FERRITIN 64 06/06/2018   IRON 82 06/06/2018   TIBC 338 06/06/2018   UIBC 256 06/06/2018   IRONPCTSAT 24 06/06/2018   Lab Results  Component Value Date   RETICCTPCT 2.1 04/12/2017   RBC 4.50 01/15/2019   RETICCTABS 116.1 11/20/2013   No results found for: KPAFRELGTCHN, LAMBDASER, KAPLAMBRATIO No results found for: IGGSERUM, IGA, IGMSERUM No results found for: Odetta Pink, SPEI   Chemistry       Component Value Date/Time   NA 139 01/15/2019 0753   NA 140 02/08/2017 0925   NA 136 03/10/2016 1147   K 4.0 01/15/2019 0753   K 3.4 02/08/2017 0925   K 3.9 03/10/2016 1147   CL 104 01/15/2019 0753   CL 105 02/08/2017 0925   CO2 28 01/15/2019 0753   CO2 26 02/08/2017 0925   CO2 24 03/10/2016 1147   BUN 10 01/15/2019 0753   BUN 10 02/08/2017 0925   BUN 10.2 03/10/2016 1147   CREATININE 0.60 01/15/2019 0753   CREATININE 0.7 02/08/2017  [id number]   CREATININE 0.7 03/10/2016 1147      Component Value Date/Time   CALCIUM 10.5 (H) 01/15/2019 0753   CALCIUM 10.4 (H) 02/08/2017 0925   CALCIUM 10.7 (H) 03/10/2016 1147   ALKPHOS 112 01/15/2019 0753   ALKPHOS 106 (H) 02/08/2017 0925   ALKPHOS 132 03/10/2016 1147   AST 16 01/15/2019 0753   AST 12 03/10/2016 1147   ALT 11 01/15/2019 0753   ALT 16 02/08/2017 0925   ALT 8 03/10/2016 1147   BILITOT 0.6 01/15/2019 0753   BILITOT 0.63 03/10/2016 1147      Impression and Plan: Ms. Petrash is a very pleasant 48 yo caucasian female with a triple negative myeloproliferative syndrome though all work up has been negative so far.   I am happy that her blood counts really have been good.  Her white cell count really has not changed while she has been off the interferon.  We do not make any changes for right now.  I looked at her blood smear.  I do not see anything that looked suspicious for any real "active" disease.  At this point, we cannot get her back after the holidays.  I have no problems with her coming back in January 2021.  I just do not see any issues with respect to her myeloproliferative process.  Hopefully, she will be able to go up to [place].  I told her I want to see pictures of her with her granddaughter in the snow.  [name], MD 10/26/20208:31 AM

## 2019-01-15 NOTE — Addendum Note (Signed)
Addended by: Melton Krebs on: 01/15/2019 09:13 AM   Modules accepted: Orders

## 2019-01-17 DIAGNOSIS — Z471 Aftercare following joint replacement surgery: Secondary | ICD-10-CM | POA: Diagnosis not present

## 2019-01-17 DIAGNOSIS — E871 Hypo-osmolality and hyponatremia: Secondary | ICD-10-CM | POA: Diagnosis not present

## 2019-01-17 DIAGNOSIS — R69 Illness, unspecified: Secondary | ICD-10-CM | POA: Diagnosis not present

## 2019-01-18 ENCOUNTER — Other Ambulatory Visit: Payer: Self-pay | Admitting: Family Medicine

## 2019-01-18 DIAGNOSIS — I10 Essential (primary) hypertension: Secondary | ICD-10-CM

## 2019-01-26 DIAGNOSIS — M76822 Posterior tibial tendinitis, left leg: Secondary | ICD-10-CM | POA: Diagnosis not present

## 2019-01-26 DIAGNOSIS — M67911 Unspecified disorder of synovium and tendon, right shoulder: Secondary | ICD-10-CM | POA: Diagnosis not present

## 2019-01-26 DIAGNOSIS — M67912 Unspecified disorder of synovium and tendon, left shoulder: Secondary | ICD-10-CM | POA: Diagnosis not present

## 2019-01-26 DIAGNOSIS — M545 Low back pain: Secondary | ICD-10-CM | POA: Diagnosis not present

## 2019-01-31 ENCOUNTER — Other Ambulatory Visit: Payer: Self-pay | Admitting: Family Medicine

## 2019-01-31 DIAGNOSIS — M545 Low back pain, unspecified: Secondary | ICD-10-CM

## 2019-02-08 DIAGNOSIS — E871 Hypo-osmolality and hyponatremia: Secondary | ICD-10-CM | POA: Diagnosis not present

## 2019-02-08 DIAGNOSIS — R69 Illness, unspecified: Secondary | ICD-10-CM | POA: Diagnosis not present

## 2019-02-08 DIAGNOSIS — Z471 Aftercare following joint replacement surgery: Secondary | ICD-10-CM | POA: Diagnosis not present

## 2019-02-12 DIAGNOSIS — I509 Heart failure, unspecified: Secondary | ICD-10-CM | POA: Diagnosis not present

## 2019-02-12 DIAGNOSIS — R69 Illness, unspecified: Secondary | ICD-10-CM | POA: Diagnosis not present

## 2019-02-12 DIAGNOSIS — I2510 Atherosclerotic heart disease of native coronary artery without angina pectoris: Secondary | ICD-10-CM | POA: Diagnosis not present

## 2019-02-18 ENCOUNTER — Other Ambulatory Visit: Payer: Self-pay | Admitting: Family Medicine

## 2019-02-18 DIAGNOSIS — R69 Illness, unspecified: Secondary | ICD-10-CM | POA: Diagnosis not present

## 2019-02-18 DIAGNOSIS — R600 Localized edema: Secondary | ICD-10-CM

## 2019-02-21 DIAGNOSIS — I2510 Atherosclerotic heart disease of native coronary artery without angina pectoris: Secondary | ICD-10-CM | POA: Diagnosis not present

## 2019-02-21 DIAGNOSIS — I509 Heart failure, unspecified: Secondary | ICD-10-CM | POA: Diagnosis not present

## 2019-02-21 DIAGNOSIS — R69 Illness, unspecified: Secondary | ICD-10-CM | POA: Diagnosis not present

## 2019-02-22 ENCOUNTER — Ambulatory Visit
Admission: RE | Admit: 2019-02-22 | Discharge: 2019-02-22 | Disposition: A | Discharge status: Home / Self Care | Payer: Medicare HMO | Source: Ambulatory Visit | Attending: Family Medicine | Admitting: Family Medicine

## 2019-02-22 ENCOUNTER — Other Ambulatory Visit: Payer: Self-pay

## 2019-02-22 DIAGNOSIS — M545 Low back pain, unspecified: Secondary | ICD-10-CM

## 2019-02-22 DIAGNOSIS — M48061 Spinal stenosis, lumbar region without neurogenic claudication: Secondary | ICD-10-CM | POA: Diagnosis not present

## 2019-03-01 DIAGNOSIS — I2510 Atherosclerotic heart disease of native coronary artery without angina pectoris: Secondary | ICD-10-CM | POA: Diagnosis not present

## 2019-03-01 DIAGNOSIS — R69 Illness, unspecified: Secondary | ICD-10-CM | POA: Diagnosis not present

## 2019-03-01 DIAGNOSIS — I509 Heart failure, unspecified: Secondary | ICD-10-CM | POA: Diagnosis not present

## 2019-03-02 DIAGNOSIS — M545 Low back pain: Secondary | ICD-10-CM | POA: Diagnosis not present

## 2019-03-07 DIAGNOSIS — R69 Illness, unspecified: Secondary | ICD-10-CM | POA: Diagnosis not present

## 2019-03-14 DIAGNOSIS — M67912 Unspecified disorder of synovium and tendon, left shoulder: Secondary | ICD-10-CM | POA: Diagnosis not present

## 2019-03-21 ENCOUNTER — Other Ambulatory Visit: Payer: Self-pay | Admitting: Orthopedic Surgery

## 2019-03-21 DIAGNOSIS — M25512 Pain in left shoulder: Secondary | ICD-10-CM

## 2019-04-03 ENCOUNTER — Ambulatory Visit: Payer: Medicare HMO | Admitting: Hematology & Oncology

## 2019-04-03 ENCOUNTER — Other Ambulatory Visit: Payer: Medicare HMO

## 2019-04-11 ENCOUNTER — Inpatient Hospital Stay: Payer: Medicare Other | Attending: Hematology & Oncology

## 2019-04-11 ENCOUNTER — Telehealth: Payer: Self-pay | Admitting: Hematology & Oncology

## 2019-04-11 ENCOUNTER — Inpatient Hospital Stay (HOSPITAL_BASED_OUTPATIENT_CLINIC_OR_DEPARTMENT_OTHER): Payer: Medicare Other | Admitting: Hematology & Oncology

## 2019-04-11 ENCOUNTER — Encounter: Payer: Self-pay | Admitting: Hematology & Oncology

## 2019-04-11 ENCOUNTER — Other Ambulatory Visit: Payer: Self-pay

## 2019-04-11 VITALS — BP 141/64 | HR 64 | Temp 97.5°F | Resp 18 | Wt 242.5 lb

## 2019-04-11 DIAGNOSIS — D471 Chronic myeloproliferative disease: Secondary | ICD-10-CM

## 2019-04-11 DIAGNOSIS — E039 Hypothyroidism, unspecified: Secondary | ICD-10-CM | POA: Insufficient documentation

## 2019-04-11 LAB — SAVE SMEAR(SSMR), FOR PROVIDER SLIDE REVIEW

## 2019-04-11 LAB — CMP (CANCER CENTER ONLY)
ALT: 10 U/L (ref 0–44)
AST: 12 U/L — ABNORMAL LOW (ref 15–41)
Albumin: 4.5 g/dL (ref 3.5–5.0)
Alkaline Phosphatase: 118 U/L (ref 38–126)
Anion gap: 8 (ref 5–15)
BUN: 17 mg/dL (ref 6–20)
CO2: 30 mmol/L (ref 22–32)
Calcium: 11 mg/dL — ABNORMAL HIGH (ref 8.9–10.3)
Chloride: 104 mmol/L (ref 98–111)
Creatinine: 0.66 mg/dL (ref 0.44–1.00)
GFR, Est AFR Am: >60 mL/min (ref >60)
GFR, Estimated: >60 mL/min (ref >60)
Glucose, Bld: 100 mg/dL — ABNORMAL HIGH (ref 70–99)
Potassium: 4.1 mmol/L (ref 3.5–5.1)
Sodium: 142 mmol/L (ref 135–145)
Total Bilirubin: 0.5 mg/dL (ref 0.3–1.2)
Total Protein: 7.5 g/dL (ref 6.5–8.1)

## 2019-04-11 LAB — CBC WITH DIFFERENTIAL (CANCER CENTER ONLY)
Abs Immature Granulocytes: 0.25 10*3/uL — ABNORMAL HIGH (ref 0.00–0.07)
Basophils Absolute: 0.1 10*3/uL (ref 0.0–0.1)
Basophils Relative: 0 %
Eosinophils Absolute: 0.2 10*3/uL (ref 0.0–0.5)
Eosinophils Relative: 1 %
HCT: 41.8 % (ref 36.0–46.0)
Hemoglobin: 13.2 g/dL (ref 12.0–15.0)
Immature Granulocytes: 2 %
Lymphocytes Relative: 20 %
Lymphs Abs: 3.2 10*3/uL (ref 0.7–4.0)
MCH: 29.4 pg (ref 26.0–34.0)
MCHC: 31.6 g/dL (ref 30.0–36.0)
MCV: 93.1 fL (ref 80.0–100.0)
Monocytes Absolute: 1.1 10*3/uL — ABNORMAL HIGH (ref 0.1–1.0)
Monocytes Relative: 7 %
Neutro Abs: 11.4 10*3/uL — ABNORMAL HIGH (ref 1.7–7.7)
Neutrophils Relative %: 70 %
Platelet Count: 318 10*3/uL (ref 150–400)
RBC: 4.49 MIL/uL (ref 3.87–5.11)
RDW: 13 % (ref 11.5–15.5)
WBC Count: 16.2 10*3/uL — ABNORMAL HIGH (ref 4.0–10.5)
nRBC: 0 % (ref 0.0–0.2)

## 2019-04-11 LAB — LACTATE DEHYDROGENASE: LDH: 140 U/L (ref 98–192)

## 2019-04-11 NOTE — Telephone Encounter (Signed)
Appointments scheduled patient will get updates from My Chart per 1/20 los

## 2019-04-11 NOTE — Progress Notes (Signed)
Hematology and Oncology Follow Up Visit  Brianna Fletcher [id number] September 26, 1970 49 y.o. 04/11/2019   Principle Diagnosis:  Chronic myeloproliferative syndrome-Triple negative Sub-clinical hypothyroidism (+) p-ANCA  Current Therapy:   Peg-Interferon q weekly - q 4 week dosing - On hold due to cost Synthroid 0.050 mg po q day   Interim History: Brianna Fletcher is here today for follow-up.  Overall, Brianna Fletcher is doing all right.  Brianna Fletcher got through all the holidays without any problem.  Unfortunately, Brianna Fletcher did not make it up to [place] to see Brianna Fletcher granddaughter.  Brianna Fletcher son, girlfriend and granddaughter are now down in [place].  I am not sure why they would want to go down there with the coronavirus and full force.  They will be down there for 6 weeks.  Brianna Fletcher is getting a lot of advice from Brianna Fletcher family as to what to take.  Brianna Fletcher has been told to take a lot of supplements to help prevent the coronavirus.  Brianna Fletcher was told not to take the vaccine.  I try to set Brianna Fletcher straight.  I see no problems with Brianna Fletcher taking the vaccine.  Brianna Fletcher has bad dentition.  Brianna Fletcher wants to have Brianna Fletcher teeth fixed.  I will see Brianna Fletcher problems Brianna Fletcher having Brianna Fletcher teeth fixed.  I think Brianna Fletcher probably will need some prophylactic antibiotic for this.  Brianna Fletcher has had no issues with fever.  Brianna Fletcher has had no rashes.  Brianna Fletcher has had no cough.  There is been no nausea or vomiting.  Brianna Fletcher has had a lot of arthritic issues.  Brianna Fletcher has to see orthopedic surgery because of bilateral rotator cuff issues.  Brianna Fletcher does not want surgery.  Brianna Fletcher cannot take anti-inflammatories.  I told Brianna Fletcher that cortisone injections would be reasonable.  Overall, Brianna Fletcher performance status is ECOG 1.     Medications:  Allergies as of 04/11/2019      Reactions   Lisinopril Cough, Other (See Comments)   Losartan Cough, Other (See Comments)   Lubiprostone Other (See Comments)   Made Brianna Fletcher sick Made Brianna Fletcher sick      Medication List       Accurate as of April 11, 2019  9:13 AM. If you have any questions, ask  your nurse or doctor.        amLODipine 10 MG tablet Commonly known as: NORVASC TAKE 1 TABLET BY MOUTH EVERY DAY   aspirin 81 MG tablet Take 81 mg by mouth daily.   clotrimazole-betamethasone cream Commonly known as: LOTRISONE APPLY 1 APPLICATION TOPICALLY 2 TIMES A DAY.   cyclobenzaprine 5 MG tablet Commonly known as: FLEXERIL Take 5-10 mg by mouth at bedtime as needed.   dicyclomine 10 MG capsule Commonly known as: Bentyl Take 1 capsule (10 mg total) by mouth 4 (four) times daily -  before meals and at bedtime.   doxepin 25 MG capsule Commonly known as: SINEQUAN Take 2 capsules (50 mg total) by mouth at bedtime.   fluticasone 50 MCG/ACT nasal spray Commonly known as: FLONASE   furosemide 20 MG tablet Commonly known as: LASIX Take 1 tablet (20 mg total) by mouth daily.   Klor-Con M20 20 MEQ tablet Generic drug: potassium chloride SA Take 20 mEq by mouth 2 (two) times daily.   levothyroxine 50 MCG tablet Commonly known as: SYNTHROID Take 1 tablet (50 mcg total) by mouth daily before breakfast. Needs ov   Magnesium 500 MG Caps Take by mouth.   omeprazole 20 MG capsule Commonly known as: PRILOSEC Take 1 capsule (  20 mg total) by mouth daily.   Pegasys 180 MCG/ML injection Generic drug: peginterferon alfa-2a Inject 0.45 mLs (81 mcg total) into the skin every 14 (fourteen) days.   vitamin C 1000 MG tablet Take by mouth daily.   VITAMIN D3 PO Take by mouth.       Allergies:  Allergies  Allergen Reactions  . Lisinopril Cough and Other (See Comments)  . Losartan Cough and Other (See Comments)  . Lubiprostone Other (See Comments)    Made Brianna Fletcher sick Made Brianna Fletcher sick    Past Medical History, Surgical history, Social history, and Family History were reviewed and updated.  Review of Systems: Review of Systems  Constitutional: Positive for malaise/fatigue.  HENT: Negative.   Eyes: Negative.   Respiratory: Negative.   Cardiovascular: Negative.     Gastrointestinal: Positive for diarrhea.  Genitourinary: Negative.   Musculoskeletal: Positive for myalgias.  Skin: Negative.   Neurological: Positive for dizziness.  Psychiatric/Behavioral: Negative.      Physical Exam:  weight is 242 lb 8 oz (110 kg). Brianna Fletcher temporal temperature is 97.5 F (36.4 C) (abnormal). Brianna Fletcher blood pressure is 141/64 (abnormal) and Brianna Fletcher pulse is 64. Brianna Fletcher respiration is 18 and oxygen saturation is 100%.   Wt Readings from Last 3 Encounters:  04/11/19 242 lb 8 oz (110 kg)  01/15/19 232 lb (105.2 kg)  12/08/18 237 lb 3.2 oz (107.6 kg)    Physical Exam Vitals reviewed.  HENT:     Head: Normocephalic and atraumatic.  Eyes:     Pupils: Pupils are equal, round, and reactive to light.  Cardiovascular:     Rate and Rhythm: Normal rate and regular rhythm.     Heart sounds: Normal heart sounds.  Pulmonary:     Effort: Pulmonary effort is normal.     Breath sounds: Normal breath sounds.  Abdominal:     General: Bowel sounds are normal.     Palpations: Abdomen is soft.  Musculoskeletal:        General: No tenderness or deformity. Normal range of motion.     Cervical back: Normal range of motion.  Lymphadenopathy:     Cervical: No cervical adenopathy.  Skin:    General: Skin is warm and dry.     Findings: No erythema or rash.  Neurological:     Mental Status: Brianna Fletcher is alert and oriented to person, place, and time.  Psychiatric:        Behavior: Behavior normal.        Thought Content: Thought content normal.        Judgment: Judgment normal.      Lab Results  Component Value Date   WBC 16.2 (H) 04/11/2019   HGB 13.2 04/11/2019   HCT 41.8 04/11/2019   MCV 93.1 04/11/2019   PLT 318 04/11/2019   Lab Results  Component Value Date   FERRITIN 64 06/06/2018   IRON 82 06/06/2018   TIBC 338 06/06/2018   UIBC 256 06/06/2018   IRONPCTSAT 24 06/06/2018   Lab Results  Component Value Date   RETICCTPCT 2.1 04/12/2017   RBC 4.49 04/11/2019   RETICCTABS  116.1 11/20/2013   No results found for: KPAFRELGTCHN, LAMBDASER, KAPLAMBRATIO No results found for: IGGSERUM, IGA, IGMSERUM No results found for: Odetta Pink, SPEI   Chemistry      Component Value Date/Time   NA 142 04/11/2019 0809   NA 140 02/08/2017 0925   NA 136 03/10/2016 1147   K 4.1 04/11/2019  0809   K 3.4 02/08/2017 0925   K 3.9 03/10/2016 1147   CL 104 04/11/2019 0809   CL 105 02/08/2017 0925   CO2 30 04/11/2019 0809   CO2 26 02/08/2017 0925   CO2 24 03/10/2016 1147   BUN 17 04/11/2019 0809   BUN 10 02/08/2017 0925   BUN 10.2 03/10/2016 1147   CREATININE 0.66 04/11/2019 0809   CREATININE 0.7 02/08/2017 0925   CREATININE 0.7 03/10/2016 1147      Component Value Date/Time   CALCIUM 11.0 (H) 04/11/2019 0809   CALCIUM 10.4 (H) 02/08/2017 0925   CALCIUM 10.7 (H) 03/10/2016 1147   ALKPHOS 118 04/11/2019 0809   ALKPHOS 106 (H) 02/08/2017 0925   ALKPHOS 132 03/10/2016 1147   AST 12 (L) 04/11/2019 0809   AST 12 03/10/2016 1147   ALT 10 04/11/2019 0809   ALT 16 02/08/2017 0925   ALT 8 03/10/2016 1147   BILITOT 0.5 04/11/2019 0809   BILITOT 0.63 03/10/2016 1147      Impression and Plan: Brianna Fletcher is a very pleasant 49 yo caucasian female with a triple negative myeloproliferative syndrome though all work up has been negative so far.   I am happy that Brianna Fletcher blood counts really have been good.  Brianna Fletcher white cell count really has not changed while Brianna Fletcher has been off the interferon.  We do not make any changes for right now.  I looked at Brianna Fletcher blood smear.  I do not see anything that looked suspicious for any real "active" disease.  We will try to get Brianna Fletcher back now in 3 months.  I feel good about have Brianna Fletcher come back in 3 months.  I had to spend about 35 minutes with Brianna Fletcher today.  I am really the only one that Brianna Fletcher likes to see in who Brianna Fletcher trusts.  I am humbled by Brianna Fletcher confidence.   I do feel bad that Brianna Fletcher cannot see Brianna Fletcher  granddaughter over the holidays.  [name], MD 1/20/20219:13 AM

## 2019-04-14 ENCOUNTER — Other Ambulatory Visit: Payer: Self-pay

## 2019-04-14 ENCOUNTER — Ambulatory Visit: Payer: Medicare Other

## 2019-04-27 DIAGNOSIS — M76822 Posterior tibial tendinitis, left leg: Secondary | ICD-10-CM | POA: Diagnosis not present

## 2019-04-28 ENCOUNTER — Other Ambulatory Visit: Payer: Self-pay

## 2019-04-28 ENCOUNTER — Ambulatory Visit
Admission: RE | Admit: 2019-04-28 | Discharge: 2019-04-28 | Disposition: A | Discharge status: Home / Self Care | Payer: Medicare Other | Source: Ambulatory Visit | Attending: Orthopedic Surgery | Admitting: Orthopedic Surgery

## 2019-04-28 DIAGNOSIS — M25512 Pain in left shoulder: Secondary | ICD-10-CM

## 2019-05-07 ENCOUNTER — Telehealth: Payer: Self-pay | Admitting: *Deleted

## 2019-05-07 NOTE — Telephone Encounter (Signed)
Call received from patient requesting Dr. Marin Olp to review her MRI of the left shoulder done on 04/28/19.  Pt states that her ortho MD instructed her to contact Dr. Marin Olp regarding "decreased T1 and intermediate increased T2 signal in all imaged bones is seen.  No fracture.  Marrow signal change in all imaged bones is compatible with the patient's history myeloproliferative disorder."  MRI reviewed by Dr. Marin Olp.  Call placed back to patient and patient notified per order of Dr. Marin Olp that there is nothing to be concerned about with above.  Pt appreciative of call back and states that she will continue to f/u with her ortho md regarding shoulder pain.

## 2019-05-16 ENCOUNTER — Encounter (INDEPENDENT_AMBULATORY_CARE_PROVIDER_SITE_OTHER): Payer: Self-pay

## 2019-05-16 DIAGNOSIS — M67912 Unspecified disorder of synovium and tendon, left shoulder: Secondary | ICD-10-CM | POA: Diagnosis not present

## 2019-05-24 ENCOUNTER — Other Ambulatory Visit: Payer: Self-pay

## 2019-05-24 ENCOUNTER — Encounter (INDEPENDENT_AMBULATORY_CARE_PROVIDER_SITE_OTHER): Payer: Self-pay | Admitting: Family Medicine

## 2019-05-24 ENCOUNTER — Ambulatory Visit (INDEPENDENT_AMBULATORY_CARE_PROVIDER_SITE_OTHER): Payer: Medicare Other | Admitting: Family Medicine

## 2019-05-24 ENCOUNTER — Encounter (INDEPENDENT_AMBULATORY_CARE_PROVIDER_SITE_OTHER): Payer: Self-pay

## 2019-05-24 VITALS — BP 145/76 | HR 63 | Temp 97.9°F | Ht 66.0 in | Wt 233.0 lb

## 2019-05-24 DIAGNOSIS — F3289 Other specified depressive episodes: Secondary | ICD-10-CM

## 2019-05-24 DIAGNOSIS — R5383 Other fatigue: Secondary | ICD-10-CM

## 2019-05-24 DIAGNOSIS — I10 Essential (primary) hypertension: Secondary | ICD-10-CM

## 2019-05-24 DIAGNOSIS — R0602 Shortness of breath: Secondary | ICD-10-CM

## 2019-05-28 DIAGNOSIS — M67912 Unspecified disorder of synovium and tendon, left shoulder: Secondary | ICD-10-CM | POA: Diagnosis not present

## 2019-05-30 DIAGNOSIS — M25672 Stiffness of left ankle, not elsewhere classified: Secondary | ICD-10-CM | POA: Diagnosis not present

## 2019-05-30 DIAGNOSIS — M25572 Pain in left ankle and joints of left foot: Secondary | ICD-10-CM | POA: Diagnosis not present

## 2019-06-04 DIAGNOSIS — J302 Other seasonal allergic rhinitis: Secondary | ICD-10-CM | POA: Diagnosis not present

## 2019-06-05 NOTE — Progress Notes (Signed)
Dear Ann Held, DO,   Thank you for referring Brianna Fletcher to our clinic. The following note includes my evaluation and treatment recommendations.  Chief Complaint:   OBESITY [name] (MR# [id number]) is a 49 y.o. female who presents for evaluation and treatment of obesity and related comorbidities. Current BMI is Body mass index is 37.61 kg/m. Radia has been struggling with her weight for many years and has been unsuccessful in either losing weight, maintaining weight loss, or reaching her healthy weight goal.  Kemyra is currently in the action stage of change and ready to dedicate time achieving and maintaining a healthier weight. Jim is interested in becoming our patient and working on intensive lifestyle modifications including (but not limited to) diet and exercise for weight loss.  Nayara's habits were reviewed today and are as follows: Her family eats meals together, her desired weight loss is 83 lbs, she started gaining weight after having children, her heaviest weight ever was 243 pounds, she has significant food cravings issues, she skips meals frequently, she is frequently drinking liquids with calories, she frequently makes poor food choices and she struggles with emotional eating.  Depression Screen [place]'s Food and Mood (modified PHQ-9) score was 9.  Depression screen Brianna Fletcher 2/9 05/24/2019  Decreased Interest 3  Down, Depressed, Hopeless 1  PHQ - 2 Score 4  Altered sleeping 0  Tired, decreased energy 2  Change in appetite 1  Feeling bad or failure about yourself  1  Trouble concentrating 0  Moving slowly or fidgety/restless 1  Suicidal thoughts 0  PHQ-9 Score 9  Difficult doing work/chores Not difficult at all  Some recent data might be hidden   Subjective:   1. Other fatigue Oakleigh admits to daytime somnolence and admits to waking up still tired. Patent has a history of symptoms of daytime fatigue. Brianna Fletcher generally gets 8 hours of sleep per  night, and states that she has nightime awakenings. Snoring is present. Apneic episodes are not present. Epworth Sleepiness Score is 6.  2. SOB (shortness of breath) on exertion Katheen notes increasing shortness of breath with exercising and seems to be worsening over time with weight gain. She notes getting out of breath sooner with activity than she used to. This has not gotten worse recently. Shonte denies shortness of breath at rest or orthopnea.  3. Essential hypertension Brianna Fletcher's blood pressure is elevated today, uncontrolled. She would benefit from lifestyle changes, but she is not able to make changes at this time.  4. Other depression, with emotional eating  Brianna Fletcher is tearful in the office discussing her social challenges and inability to improve her health currently. She has a lot of challenges in her life which she explained in detail, and she doesn't think she can concentrate on lifestyle changes at this time.  Assessment/Plan:   1. Other fatigue Brianna Fletcher does feel that her weight is causing her energy to be lower than it should be. Fatigue may be related to obesity, depression or many other causes. Labs will be ordered, and in the meanwhile, Mabelene will focus on self care including making healthy food choices, increasing physical activity and focusing on stress reduction.  - EKG 12-Lead  2. SOB (shortness of breath) on exertion Brianna Fletcher does feel that she gets out of breath more easily that she used to when she exercises. Brianna Fletcher's shortness of breath appears to be obesity related and exercise induced. She has agreed to work on weight loss and gradually increase  exercise to treat her exercise induced shortness of breath. Will continue to monitor closely.  3. Essential hypertension Brianna Fletcher was encouraged to continue her medications and follow up with her primary care physician.  4. Other depression, with emotional eating  Brianna Fletcher was encouraged to work on dealing with her other stressors before  she works on weight loss. Her program fee was refunded and she may schedule again later when she is ready.  5. Class 2 severe obesity with serious comorbidity and body mass index (BMI) of 37.0 to 37.9 in adult, unspecified obesity type (HCC) Brianna Fletcher is not currently in the action stage of change. I recommend Wave begin the structured treatment plan as follows:  She has agreed to the Category 2 Plan.  Exercise goals: No exercise has been prescribed at this time.   Behavioral modification strategies: increasing lean protein intake.  She was informed of the importance of frequent follow-up visits to maximize her success with intensive lifestyle modifications for her multiple health conditions. She was informed we would discuss her lab results at her next visit unless there is a critical issue that needs to be addressed sooner. Brianna Fletcher agreed to keep her next visit at the agreed upon time to discuss these results.  Objective:   Blood pressure (!) 145/76, pulse 63, temperature 97.9 F (36.6 C), temperature source Oral, height 5\' 6"  (1.676 m), weight 233 lb (105.7 kg), SpO2 96 %. Body mass index is 37.61 kg/m.  EKG: Normal sinus rhythm, rate 62 BPM.  Indirect Calorimeter completed today shows a VO2 of 199 and a REE of 1383.  Her calculated basal metabolic rate is [id number] thus her basal metabolic rate is worse than expected.  General: Cooperative, alert, well developed, in no acute distress. HEENT: Conjunctivae and lids unremarkable. Cardiovascular: Regular rhythm.  Lungs: Normal work of breathing. Neurologic: No focal deficits.   Lab Results  Component Value Date   CREATININE 0.66 04/11/2019   BUN 17 04/11/2019   NA 142 04/11/2019   K 4.1 04/11/2019   CL 104 04/11/2019   CO2 30 04/11/2019   Lab Results  Component Value Date   ALT 10 04/11/2019   AST 12 (L) 04/11/2019   ALKPHOS 118 04/11/2019   BILITOT 0.5 04/11/2019   No results found for: HGBA1C No results found for: INSULIN Lab  Results  Component Value Date   TSH 0.64 12/08/2018   Lab Results  Component Value Date   CHOL 204 (H) 12/08/2018   HDL 41.90 12/08/2018   LDLCALC 126 (H) 12/08/2018   LDLDIRECT 120.0 12/08/2017   TRIG 184.0 (H) 12/08/2018   CHOLHDL 5 12/08/2018   Lab Results  Component Value Date   WBC 16.2 (H) 04/11/2019   HGB 13.2 04/11/2019   HCT 41.8 04/11/2019   MCV 93.1 04/11/2019   PLT 318 04/11/2019   Lab Results  Component Value Date   IRON 82 06/06/2018   TIBC 338 06/06/2018   FERRITIN 64 06/06/2018   Attestation Statements:   Reviewed by clinician on day of visit: allergies, medications, problem list, medical history, surgical history, family history, social history, and previous encounter notes.  Time spent on visit including pre-visit chart review and post-visit charting and care was 60 minutes.    I, Trixie Dredge, am acting as transcriptionist for Dennard Nip, MD.  I have reviewed the above documentation for accuracy and completeness, and I agree with the above. - Dennard Nip, MD  2

## 2019-06-06 DIAGNOSIS — M25572 Pain in left ankle and joints of left foot: Secondary | ICD-10-CM | POA: Diagnosis not present

## 2019-06-06 DIAGNOSIS — M25672 Stiffness of left ankle, not elsewhere classified: Secondary | ICD-10-CM | POA: Diagnosis not present

## 2019-06-07 ENCOUNTER — Ambulatory Visit (INDEPENDENT_AMBULATORY_CARE_PROVIDER_SITE_OTHER): Payer: Self-pay | Admitting: Family Medicine

## 2019-06-08 DIAGNOSIS — M25672 Stiffness of left ankle, not elsewhere classified: Secondary | ICD-10-CM | POA: Diagnosis not present

## 2019-06-08 DIAGNOSIS — M25572 Pain in left ankle and joints of left foot: Secondary | ICD-10-CM | POA: Diagnosis not present

## 2019-06-08 IMAGING — MG DIGITAL SCREENING BILATERAL MAMMOGRAM WITH TOMO AND CAD
8 series · 8 of 24 positions shown · non-contrast
Comparison: Previous exam(s).

CLINICAL DATA: Screening.

EXAM:
DIGITAL SCREENING BILATERAL MAMMOGRAM WITH TOMO AND CAD

[L MLO synth-2D]
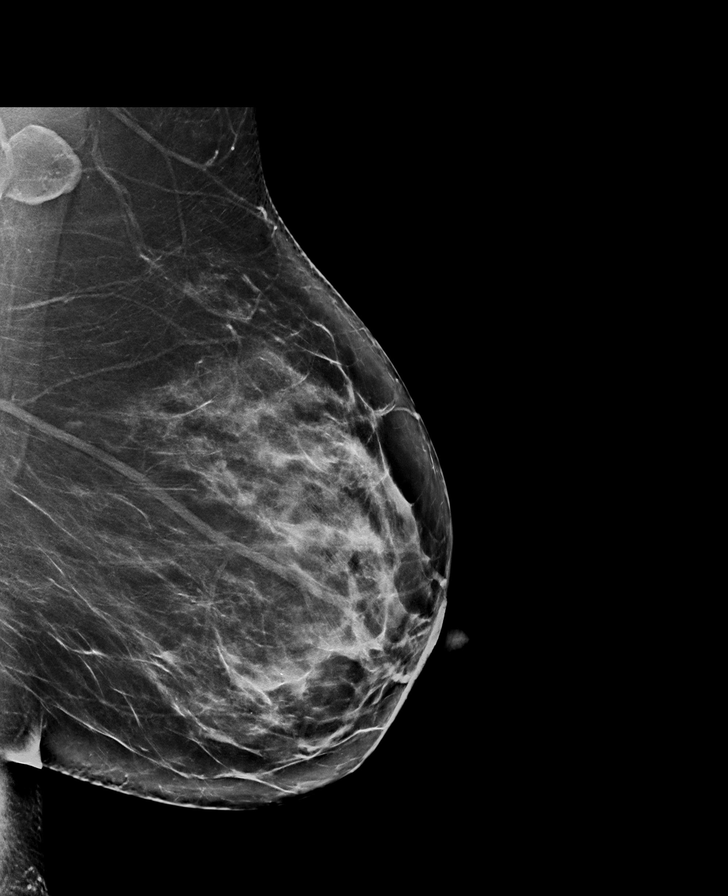

[L CC synth-2D]
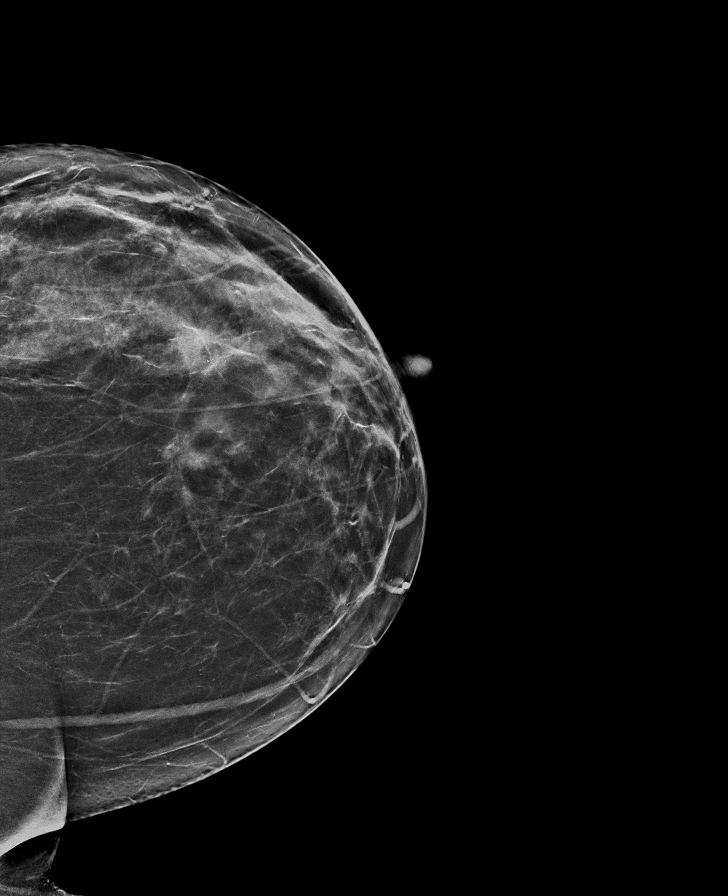

[R CC synth-2D]
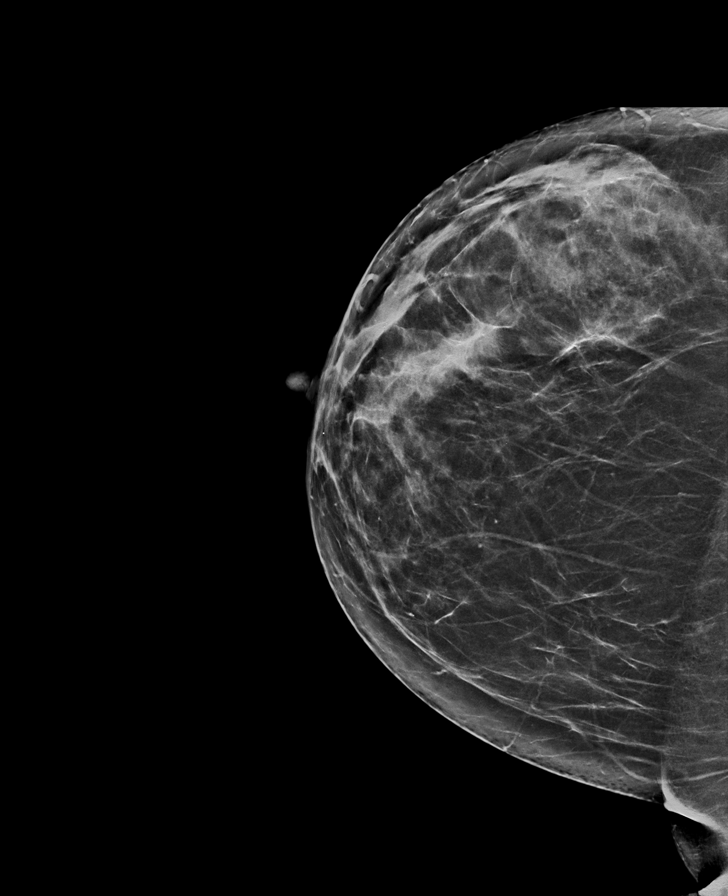

[R MLO synth-2D]
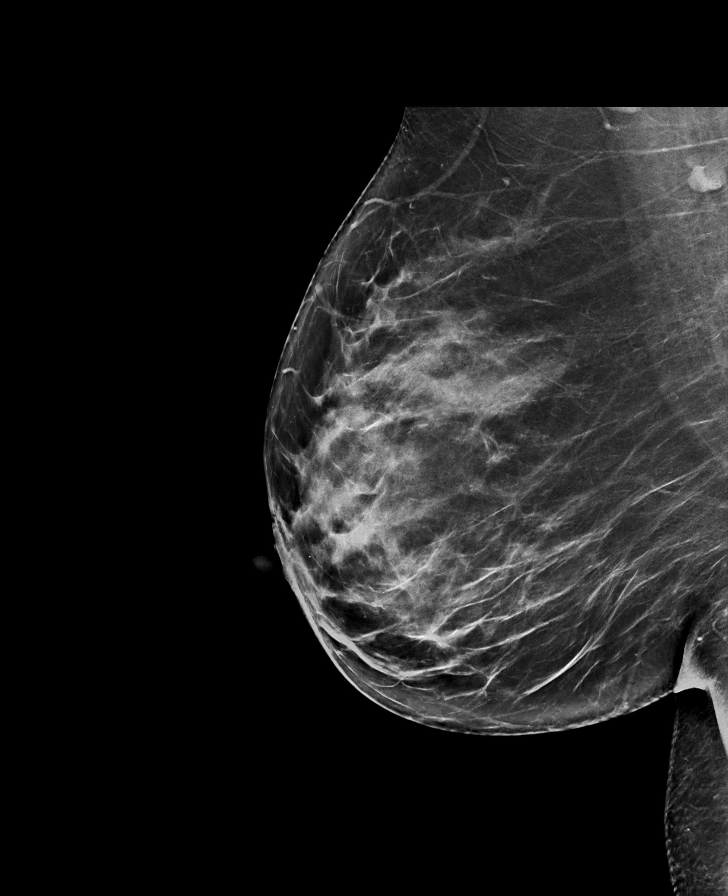

[L CC tomo · tomo slice 35/68.0]
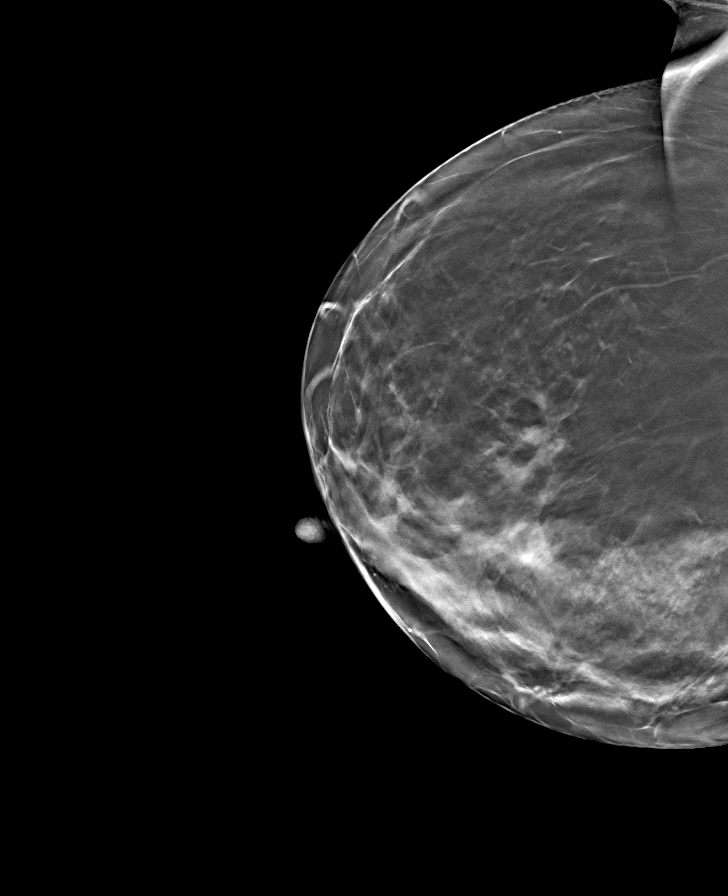

[R MLO tomo · tomo slice 43/84.0]
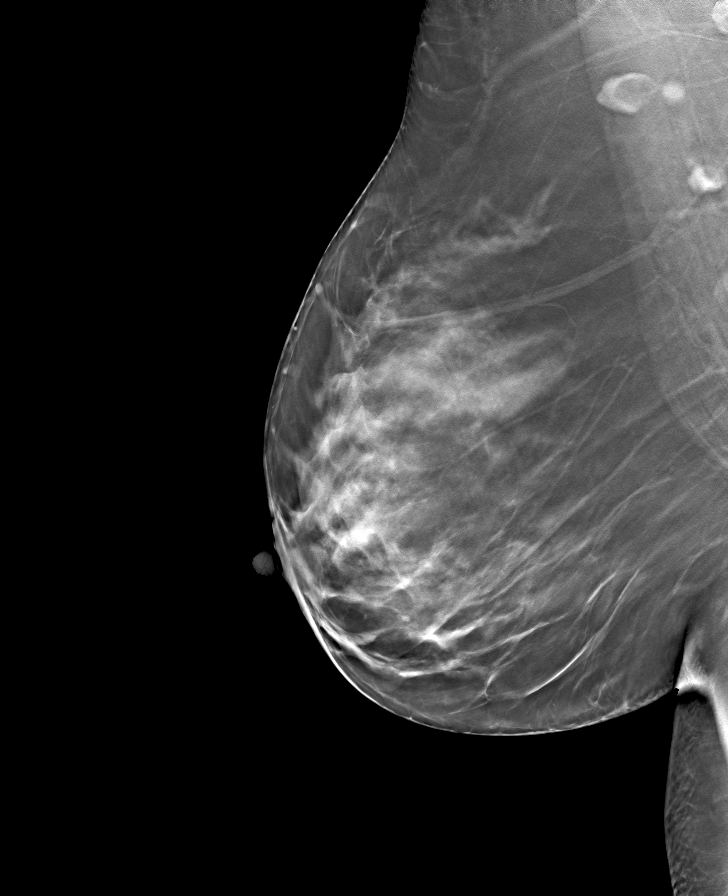

[R CC tomo · tomo slice 37/72.0]
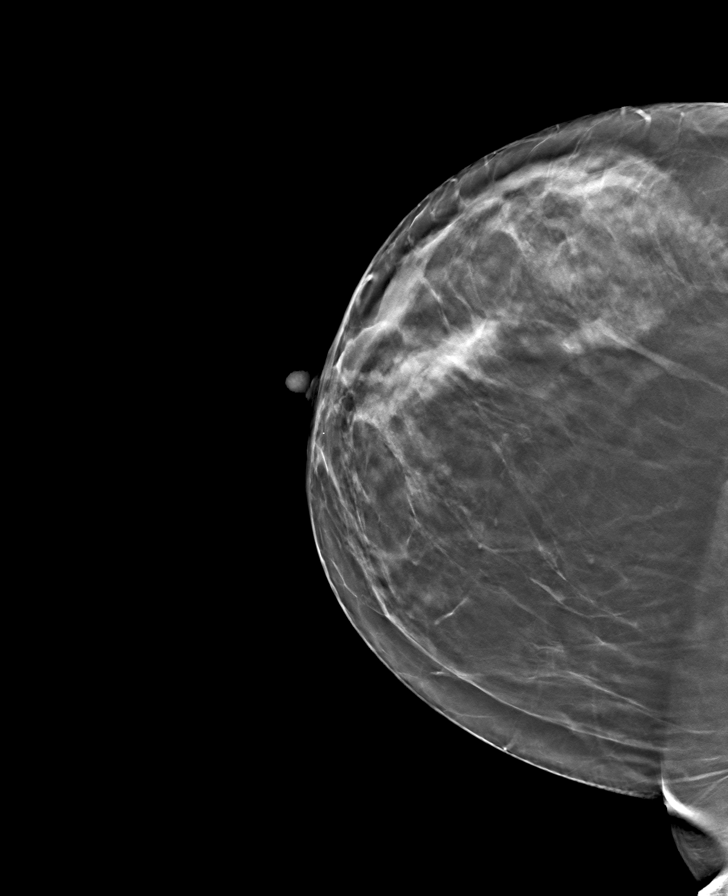

[L MLO tomo · tomo slice 43/85.0]
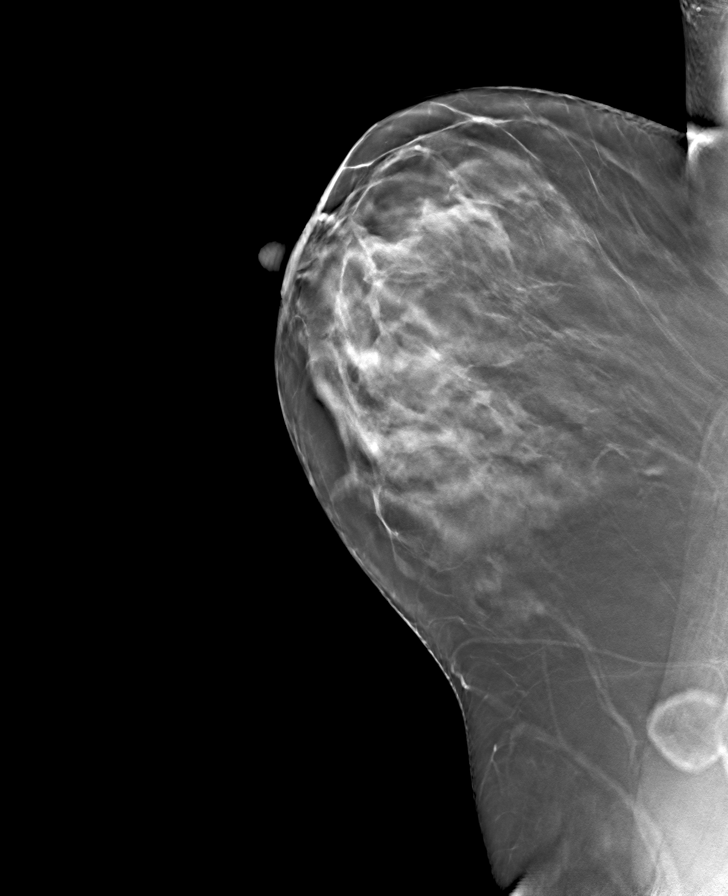

[8 of 24 positions shown; findings below may reference images not displayed]

ACR Breast Density Category c: The breast tissue is heterogeneously
dense, which may obscure small masses.
FINDINGS: There are no findings suspicious for malignancy. Images were
processed with CAD.
IMPRESSION: No mammographic evidence of malignancy. A result letter of this
screening mammogram will be mailed directly to the patient.

RECOMMENDATION:
Screening mammogram in one year. (Code:[id number])

BI-RADS CATEGORY  1: Negative.

## 2019-06-11 DIAGNOSIS — M25572 Pain in left ankle and joints of left foot: Secondary | ICD-10-CM | POA: Diagnosis not present

## 2019-06-11 DIAGNOSIS — M25672 Stiffness of left ankle, not elsewhere classified: Secondary | ICD-10-CM | POA: Diagnosis not present

## 2019-06-13 ENCOUNTER — Encounter: Payer: Medicare HMO | Admitting: Family Medicine

## 2019-06-15 DIAGNOSIS — M25572 Pain in left ankle and joints of left foot: Secondary | ICD-10-CM | POA: Diagnosis not present

## 2019-06-15 DIAGNOSIS — M25672 Stiffness of left ankle, not elsewhere classified: Secondary | ICD-10-CM | POA: Diagnosis not present

## 2019-06-19 ENCOUNTER — Other Ambulatory Visit: Payer: Self-pay | Admitting: Family Medicine

## 2019-06-19 DIAGNOSIS — R600 Localized edema: Secondary | ICD-10-CM

## 2019-06-19 DIAGNOSIS — M25672 Stiffness of left ankle, not elsewhere classified: Secondary | ICD-10-CM | POA: Diagnosis not present

## 2019-06-19 DIAGNOSIS — M25572 Pain in left ankle and joints of left foot: Secondary | ICD-10-CM | POA: Diagnosis not present

## 2019-06-21 DIAGNOSIS — M25572 Pain in left ankle and joints of left foot: Secondary | ICD-10-CM | POA: Diagnosis not present

## 2019-06-21 DIAGNOSIS — M25672 Stiffness of left ankle, not elsewhere classified: Secondary | ICD-10-CM | POA: Diagnosis not present

## 2019-06-25 DIAGNOSIS — M25572 Pain in left ankle and joints of left foot: Secondary | ICD-10-CM | POA: Diagnosis not present

## 2019-06-25 DIAGNOSIS — M25672 Stiffness of left ankle, not elsewhere classified: Secondary | ICD-10-CM | POA: Diagnosis not present

## 2019-07-02 DIAGNOSIS — M25572 Pain in left ankle and joints of left foot: Secondary | ICD-10-CM | POA: Diagnosis not present

## 2019-07-02 DIAGNOSIS — M25672 Stiffness of left ankle, not elsewhere classified: Secondary | ICD-10-CM | POA: Diagnosis not present

## 2019-07-10 ENCOUNTER — Inpatient Hospital Stay: Payer: Medicare Other

## 2019-07-10 ENCOUNTER — Inpatient Hospital Stay: Payer: Medicare Other | Admitting: Hematology & Oncology

## 2019-07-11 DIAGNOSIS — M25572 Pain in left ankle and joints of left foot: Secondary | ICD-10-CM | POA: Diagnosis not present

## 2019-07-11 DIAGNOSIS — M25672 Stiffness of left ankle, not elsewhere classified: Secondary | ICD-10-CM | POA: Diagnosis not present

## 2019-07-13 ENCOUNTER — Encounter: Payer: Self-pay | Admitting: Hematology & Oncology

## 2019-07-13 ENCOUNTER — Inpatient Hospital Stay: Payer: Medicare Other | Attending: Hematology & Oncology

## 2019-07-13 ENCOUNTER — Inpatient Hospital Stay (HOSPITAL_BASED_OUTPATIENT_CLINIC_OR_DEPARTMENT_OTHER): Payer: Medicare Other | Admitting: Hematology & Oncology

## 2019-07-13 ENCOUNTER — Other Ambulatory Visit: Payer: Self-pay

## 2019-07-13 VITALS — BP 133/69 | HR 62 | Temp 97.5°F | Resp 18 | Ht 66.0 in | Wt 243.8 lb

## 2019-07-13 DIAGNOSIS — E039 Hypothyroidism, unspecified: Secondary | ICD-10-CM | POA: Insufficient documentation

## 2019-07-13 DIAGNOSIS — D471 Chronic myeloproliferative disease: Secondary | ICD-10-CM

## 2019-07-13 LAB — CBC WITH DIFFERENTIAL (CANCER CENTER ONLY)
Abs Immature Granulocytes: 0.19 10*3/uL — ABNORMAL HIGH (ref 0.00–0.07)
Basophils Absolute: 0.1 10*3/uL (ref 0.0–0.1)
Basophils Relative: 0 %
Eosinophils Absolute: 0.1 10*3/uL (ref 0.0–0.5)
Eosinophils Relative: 1 %
HCT: 39.6 % (ref 36.0–46.0)
Hemoglobin: 12.7 g/dL (ref 12.0–15.0)
Immature Granulocytes: 1 %
Lymphocytes Relative: 19 %
Lymphs Abs: 2.8 10*3/uL (ref 0.7–4.0)
MCH: 29.7 pg (ref 26.0–34.0)
MCHC: 32.1 g/dL (ref 30.0–36.0)
MCV: 92.7 fL (ref 80.0–100.0)
Monocytes Absolute: 0.8 10*3/uL (ref 0.1–1.0)
Monocytes Relative: 5 %
Neutro Abs: 10.9 10*3/uL — ABNORMAL HIGH (ref 1.7–7.7)
Neutrophils Relative %: 74 %
Platelet Count: 288 10*3/uL (ref 150–400)
RBC: 4.27 MIL/uL (ref 3.87–5.11)
RDW: 12.5 % (ref 11.5–15.5)
WBC Count: 14.8 10*3/uL — ABNORMAL HIGH (ref 4.0–10.5)
nRBC: 0 % (ref 0.0–0.2)

## 2019-07-13 LAB — CMP (CANCER CENTER ONLY)
ALT: 7 U/L (ref 0–44)
AST: 13 U/L — ABNORMAL LOW (ref 15–41)
Albumin: 4.2 g/dL (ref 3.5–5.0)
Alkaline Phosphatase: 124 U/L (ref 38–126)
Anion gap: 8 (ref 5–15)
BUN: 9 mg/dL (ref 6–20)
CO2: 26 mmol/L (ref 22–32)
Calcium: 10.7 mg/dL — ABNORMAL HIGH (ref 8.9–10.3)
Chloride: 106 mmol/L (ref 98–111)
Creatinine: 0.6 mg/dL (ref 0.44–1.00)
GFR, Est AFR Am: >60 mL/min (ref >60)
GFR, Estimated: >60 mL/min (ref >60)
Glucose, Bld: 105 mg/dL — ABNORMAL HIGH (ref 70–99)
Potassium: 4.1 mmol/L (ref 3.5–5.1)
Sodium: 140 mmol/L (ref 135–145)
Total Bilirubin: 0.5 mg/dL (ref 0.3–1.2)
Total Protein: 6.9 g/dL (ref 6.5–8.1)

## 2019-07-13 LAB — LACTATE DEHYDROGENASE: LDH: 151 U/L (ref 98–192)

## 2019-07-13 LAB — SAVE SMEAR(SSMR), FOR PROVIDER SLIDE REVIEW

## 2019-07-13 NOTE — Progress Notes (Signed)
Hematology and Oncology Follow Up Visit  Brianna Fletcher [id number] 1971-01-08 49 y.o. 07/13/2019   Principle Diagnosis:  Chronic myeloproliferative syndrome-Triple negative Sub-clinical hypothyroidism (+) p-ANCA  Current Therapy:   Peg-Interferon q weekly - q 4 week dosing - On hold due to cost Synthroid 0.050 mg po q day   Interim History: Brianna Fletcher is here today for follow-up.  Overall, she is doing all right.  She does have some joint problems with her left shoulder.  Is also some problems with her foot.  She may need to have surgery.  I think that she probably will do her best not to go through surgery.  She has had her coronavirus vaccines.  She has been very cautious with the coronavirus.  She has had no problems with fever.  She has had no change in bowel or bladder habits.  She is now been able to go up to [place] to see her granddaughter.  She does the FaceTime with her granddaughter.  She has had no bleeding.  There is been no rashes.  Overall, her performance status is ECOG 1.     Medications:  Allergies as of 07/13/2019      Reactions   Lisinopril Cough   Losartan Cough   Lubiprostone Nausea And Vomiting      Medication List       Accurate as of July 13, 2019  8:28 AM. If you have any questions, ask your nurse or doctor.        amLODipine 10 MG tablet Commonly known as: NORVASC TAKE 1 TABLET BY MOUTH EVERY DAY   aspirin 81 MG tablet Take 81 mg by mouth daily.   Fish Oil 1200 MG Caps Take 1 capsule by mouth daily.   furosemide 20 MG tablet Commonly known as: LASIX TAKE 1 TABLET BY MOUTH EVERY DAY   levothyroxine 50 MCG tablet Commonly known as: SYNTHROID Take 1 tablet (50 mcg total) by mouth daily before breakfast. Needs ov   Magnesium 500 MG Caps Take by mouth.   melatonin 5 MG Tabs Take 1 tablet by mouth daily.   omeprazole 20 MG capsule Commonly known as: PRILOSEC Take 1 capsule (20 mg total) by mouth daily.   vitamin C  1000 MG tablet Take by mouth daily.   VITAMIN D3 PO Take by mouth.   zinc gluconate 50 MG tablet Take 50 mg by mouth daily.       Allergies:  Allergies  Allergen Reactions  . Lisinopril Cough  . Losartan Cough  . Lubiprostone Nausea And Vomiting    Past Medical History, Surgical history, Social history, and Family History were reviewed and updated.  Review of Systems: Review of Systems  Constitutional: Positive for malaise/fatigue.  HENT: Negative.   Eyes: Negative.   Respiratory: Negative.   Cardiovascular: Negative.   Gastrointestinal: Positive for diarrhea.  Genitourinary: Negative.   Musculoskeletal: Positive for myalgias.  Skin: Negative.   Neurological: Positive for dizziness.  Psychiatric/Behavioral: Negative.      Physical Exam:  height is 5\' 6"  (1.676 m) and weight is 243 lb 12.8 oz (110.6 kg). Her temporal temperature is 97.5 F (36.4 C) (abnormal). Her blood pressure is 133/69 and her pulse is 62. Her respiration is 18 and oxygen saturation is 100%.   Wt Readings from Last 3 Encounters:  07/13/19 243 lb 12.8 oz (110.6 kg)  05/24/19 233 lb (105.7 kg)  04/11/19 242 lb 8 oz (110 kg)    Physical Exam Vitals reviewed.  HENT:  Head: Normocephalic and atraumatic.  Eyes:     Pupils: Pupils are equal, round, and reactive to light.  Cardiovascular:     Rate and Rhythm: Normal rate and regular rhythm.     Heart sounds: Normal heart sounds.  Pulmonary:     Effort: Pulmonary effort is normal.     Breath sounds: Normal breath sounds.  Abdominal:     General: Bowel sounds are normal.     Palpations: Abdomen is soft.  Musculoskeletal:        General: No tenderness or deformity. Normal range of motion.     Cervical back: Normal range of motion.  Lymphadenopathy:     Cervical: No cervical adenopathy.  Skin:    General: Skin is warm and dry.     Findings: No erythema or rash.  Neurological:     Mental Status: She is alert and oriented to person,  place, and time.  Psychiatric:        Behavior: Behavior normal.        Thought Content: Thought content normal.        Judgment: Judgment normal.      Lab Results  Component Value Date   WBC 14.8 (H) 07/13/2019   HGB 12.7 07/13/2019   HCT 39.6 07/13/2019   MCV 92.7 07/13/2019   PLT 288 07/13/2019   Lab Results  Component Value Date   FERRITIN 64 06/06/2018   IRON 82 06/06/2018   TIBC 338 06/06/2018   UIBC 256 06/06/2018   IRONPCTSAT 24 06/06/2018   Lab Results  Component Value Date   RETICCTPCT 2.1 04/12/2017   RBC 4.27 07/13/2019   RETICCTABS 116.1 11/20/2013   No results found for: KPAFRELGTCHN, LAMBDASER, KAPLAMBRATIO No results found for: IGGSERUM, IGA, IGMSERUM No results found for: Kathrynn Ducking, MSPIKE, SPEI   Chemistry      Component Value Date/Time   NA 142 04/11/2019 0809   NA 140 02/08/2017 0925   NA 136 03/10/2016 1147   K 4.1 04/11/2019 0809   K 3.4 02/08/2017 0925   K 3.9 03/10/2016 1147   CL 104 04/11/2019 0809   CL 105 02/08/2017 0925   CO2 30 04/11/2019 0809   CO2 26 02/08/2017 0925   CO2 24 03/10/2016 1147   BUN 17 04/11/2019 0809   BUN 10 02/08/2017 0925   BUN 10.2 03/10/2016 1147   CREATININE 0.66 04/11/2019 0809   CREATININE 0.7 02/08/2017 0925   CREATININE 0.7 03/10/2016 1147      Component Value Date/Time   CALCIUM 11.0 (H) 04/11/2019 0809   CALCIUM 10.4 (H) 02/08/2017 0925   CALCIUM 10.7 (H) 03/10/2016 1147   ALKPHOS 118 04/11/2019 0809   ALKPHOS 106 (H) 02/08/2017 0925   ALKPHOS 132 03/10/2016 1147   AST 12 (L) 04/11/2019 0809   AST 12 03/10/2016 1147   ALT 10 04/11/2019 0809   ALT 16 02/08/2017 0925   ALT 8 03/10/2016 1147   BILITOT 0.5 04/11/2019 0809   BILITOT 0.63 03/10/2016 1147      Impression and Plan: Brianna Fletcher is a very pleasant 49 yo caucasian female with a triple negative myeloproliferative syndrome though all work up has been negative so far.   I am happy that  her blood counts really have been good.  Her white cell count really has not changed while she has been off the interferon.  We do not make any changes for right now.  I looked at her blood smear.  I  do not see anything that looked suspicious for any real "active" disease.  We will try to get her back now in 3 months.  I feel good about have her come back in 3 months.  I had to spend about 35 minutes with her today.  I am really the only one that she likes to see in who she trusts.  I am humbled by her confidence.   I do hope that she will be able to see her granddaughter sometime this Spring.   [name], MD 4/23/20218:28 AM

## 2019-07-16 DIAGNOSIS — M25572 Pain in left ankle and joints of left foot: Secondary | ICD-10-CM | POA: Diagnosis not present

## 2019-07-16 DIAGNOSIS — M25672 Stiffness of left ankle, not elsewhere classified: Secondary | ICD-10-CM | POA: Diagnosis not present

## 2019-07-17 ENCOUNTER — Other Ambulatory Visit: Payer: Self-pay | Admitting: Family Medicine

## 2019-07-17 DIAGNOSIS — I10 Essential (primary) hypertension: Secondary | ICD-10-CM

## 2019-07-18 DIAGNOSIS — M25572 Pain in left ankle and joints of left foot: Secondary | ICD-10-CM | POA: Diagnosis not present

## 2019-07-18 DIAGNOSIS — M25672 Stiffness of left ankle, not elsewhere classified: Secondary | ICD-10-CM | POA: Diagnosis not present

## 2019-07-23 DIAGNOSIS — M25672 Stiffness of left ankle, not elsewhere classified: Secondary | ICD-10-CM | POA: Diagnosis not present

## 2019-07-23 DIAGNOSIS — M25572 Pain in left ankle and joints of left foot: Secondary | ICD-10-CM | POA: Diagnosis not present

## 2019-07-25 DIAGNOSIS — M25572 Pain in left ankle and joints of left foot: Secondary | ICD-10-CM | POA: Diagnosis not present

## 2019-07-25 DIAGNOSIS — M25672 Stiffness of left ankle, not elsewhere classified: Secondary | ICD-10-CM | POA: Diagnosis not present

## 2019-08-18 ENCOUNTER — Other Ambulatory Visit: Payer: Self-pay | Admitting: Family Medicine

## 2019-08-18 DIAGNOSIS — D471 Chronic myeloproliferative disease: Secondary | ICD-10-CM

## 2019-08-18 DIAGNOSIS — E041 Nontoxic single thyroid nodule: Secondary | ICD-10-CM

## 2019-10-11 ENCOUNTER — Inpatient Hospital Stay: Payer: Medicare Other | Attending: Hematology & Oncology

## 2019-10-11 ENCOUNTER — Inpatient Hospital Stay (HOSPITAL_BASED_OUTPATIENT_CLINIC_OR_DEPARTMENT_OTHER): Payer: Medicare Other | Admitting: Hematology & Oncology

## 2019-10-11 ENCOUNTER — Telehealth: Payer: Self-pay | Admitting: Hematology & Oncology

## 2019-10-11 ENCOUNTER — Other Ambulatory Visit: Payer: Self-pay

## 2019-10-11 ENCOUNTER — Encounter: Payer: Self-pay | Admitting: Hematology & Oncology

## 2019-10-11 VITALS — BP 150/66 | HR 65 | Temp 97.5°F | Resp 20 | Wt 240.0 lb

## 2019-10-11 DIAGNOSIS — E039 Hypothyroidism, unspecified: Secondary | ICD-10-CM | POA: Insufficient documentation

## 2019-10-11 DIAGNOSIS — D471 Chronic myeloproliferative disease: Secondary | ICD-10-CM

## 2019-10-11 LAB — CBC WITH DIFFERENTIAL (CANCER CENTER ONLY)
Abs Immature Granulocytes: 0.15 10*3/uL — ABNORMAL HIGH (ref 0.00–0.07)
Basophils Absolute: 0.1 10*3/uL (ref 0.0–0.1)
Basophils Relative: 0 %
Eosinophils Absolute: 0.2 10*3/uL (ref 0.0–0.5)
Eosinophils Relative: 1 %
HCT: 41.6 % (ref 36.0–46.0)
Hemoglobin: 13.3 g/dL (ref 12.0–15.0)
Immature Granulocytes: 1 %
Lymphocytes Relative: 18 %
Lymphs Abs: 3 10*3/uL (ref 0.7–4.0)
MCH: 30.4 pg (ref 26.0–34.0)
MCHC: 32 g/dL (ref 30.0–36.0)
MCV: 95 fL (ref 80.0–100.0)
Monocytes Absolute: 1 10*3/uL (ref 0.1–1.0)
Monocytes Relative: 6 %
Neutro Abs: 12.7 10*3/uL — ABNORMAL HIGH (ref 1.7–7.7)
Neutrophils Relative %: 74 %
Platelet Count: 273 10*3/uL (ref 150–400)
RBC: 4.38 MIL/uL (ref 3.87–5.11)
RDW: 12.6 % (ref 11.5–15.5)
WBC Count: 17 10*3/uL — ABNORMAL HIGH (ref 4.0–10.5)
nRBC: 0 % (ref 0.0–0.2)

## 2019-10-11 LAB — CMP (CANCER CENTER ONLY)
ALT: 6 U/L (ref 0–44)
AST: 10 U/L — ABNORMAL LOW (ref 15–41)
Albumin: 4.3 g/dL (ref 3.5–5.0)
Alkaline Phosphatase: 125 U/L (ref 38–126)
Anion gap: 7 (ref 5–15)
BUN: 10 mg/dL (ref 6–20)
CO2: 28 mmol/L (ref 22–32)
Calcium: 10.7 mg/dL — ABNORMAL HIGH (ref 8.9–10.3)
Chloride: 103 mmol/L (ref 98–111)
Creatinine: 0.53 mg/dL (ref 0.44–1.00)
GFR, Est AFR Am: >60 mL/min (ref >60)
GFR, Estimated: >60 mL/min (ref >60)
Glucose, Bld: 97 mg/dL (ref 70–99)
Potassium: 3.8 mmol/L (ref 3.5–5.1)
Sodium: 138 mmol/L (ref 135–145)
Total Bilirubin: 0.4 mg/dL (ref 0.3–1.2)
Total Protein: 7.1 g/dL (ref 6.5–8.1)

## 2019-10-11 LAB — LACTATE DEHYDROGENASE: LDH: 166 U/L (ref 98–192)

## 2019-10-11 NOTE — Progress Notes (Signed)
Hematology and Oncology Follow Up Visit  Brianna Fletcher [id number] [birth year] 49 y.o. 10/11/2019   Principle Diagnosis:  Chronic myeloproliferative syndrome-Triple negative Sub-clinical hypothyroidism (+) p-ANCA  Current Therapy:   Peg-Interferon q weekly - q 4 week dosing - On hold due to cost Synthroid 0.050 mg po q day   Interim History: Brianna Fletcher is here today for follow-up.  Reporting that she has is that her mother is declining.  Her mother has dementia that is getting worse.  She really needs to go to assisted living but she does not wish to go.  Brianna Fletcher has not been able to go up to [place] to see her granddaughter.  Her younger son has moved down to [place].  Health wise, she is about the same.  Her left foot is doing a lot better.  She still have some problems with the left shoulder.  Because of her mom, she really not been able to go see the orthopedist.  She has had no fever.  There has been no change in bowel or bladder habits.  She has had no rashes.  There has been no cough or shortness of breath..  Overall, her performance status is ECOG 1.     Medications:  Allergies as of 10/11/2019      Reactions   Lisinopril Cough   Losartan Cough   Lubiprostone Nausea And Vomiting      Medication List       Accurate as of October 11, 2019  8:43 AM. If you have any questions, ask your nurse or doctor.        amLODipine 10 MG tablet Commonly known as: NORVASC TAKE 1 TABLET BY MOUTH EVERY DAY   aspirin 81 MG tablet Take 81 mg by mouth daily.   diclofenac Sodium 1 % Gel Commonly known as: VOLTAREN Apply topically.   Fish Oil 1200 MG Caps Take 1 capsule by mouth daily.   furosemide 20 MG tablet Commonly known as: LASIX TAKE 1 TABLET BY MOUTH EVERY DAY   levothyroxine 50 MCG tablet Commonly known as: SYNTHROID TAKE 1 TABLET (50 MCG TOTAL) BY MOUTH DAILY BEFORE BREAKFAST. NEEDS OV   Magnesium 500 MG Caps Take by mouth daily.   melatonin 5 MG  Tabs Take 1 tablet by mouth daily.   omeprazole 20 MG capsule Commonly known as: PRILOSEC Take 1 capsule (20 mg total) by mouth daily.   vitamin C 1000 MG tablet Take by mouth daily.   VITAMIN D3 PO Take by mouth daily.   zinc gluconate 50 MG tablet Take 50 mg by mouth daily.       Allergies:  Allergies  Allergen Reactions  . Lisinopril Cough  . Losartan Cough  . Lubiprostone Nausea And Vomiting    Past Medical History, Surgical history, Social history, and Family History were reviewed and updated.  Review of Systems: Review of Systems  Constitutional: Positive for malaise/fatigue.  HENT: Negative.   Eyes: Negative.   Respiratory: Negative.   Cardiovascular: Negative.   Gastrointestinal: Positive for diarrhea.  Genitourinary: Negative.   Musculoskeletal: Positive for myalgias.  Skin: Negative.   Neurological: Positive for dizziness.  Psychiatric/Behavioral: Negative.      Physical Exam:  weight is 240 lb (108.9 kg). Her oral temperature is 97.5 F (36.4 C) (abnormal). Her blood pressure is 150/66 (abnormal) and her pulse is 65. Her respiration is 20 and oxygen saturation is 100%.   Wt Readings from Last 3 Encounters:  10/11/19 240 lb (108.9 kg)  07/13/19 243 lb 12.8 oz (110.6 kg)  05/24/19 233 lb (105.7 kg)    Physical Exam Vitals reviewed.  HENT:     Head: Normocephalic and atraumatic.  Eyes:     Pupils: Pupils are equal, round, and reactive to light.  Cardiovascular:     Rate and Rhythm: Normal rate and regular rhythm.     Heart sounds: Normal heart sounds.  Pulmonary:     Effort: Pulmonary effort is normal.     Breath sounds: Normal breath sounds.  Abdominal:     General: Bowel sounds are normal.     Palpations: Abdomen is soft.  Musculoskeletal:        General: No tenderness or deformity. Normal range of motion.     Cervical back: Normal range of motion.  Lymphadenopathy:     Cervical: No cervical adenopathy.  Skin:    General: Skin is  warm and dry.     Findings: No erythema or rash.  Neurological:     Mental Status: She is alert and oriented to person, place, and time.  Psychiatric:        Behavior: Behavior normal.        Thought Content: Thought content normal.        Judgment: Judgment normal.      Lab Results  Component Value Date   WBC 17.0 (H) 10/11/2019   HGB 13.3 10/11/2019   HCT 41.6 10/11/2019   MCV 95.0 10/11/2019   PLT 273 10/11/2019   Lab Results  Component Value Date   FERRITIN 64 06/06/2018   IRON 82 06/06/2018   TIBC 338 06/06/2018   UIBC 256 06/06/2018   IRONPCTSAT 24 06/06/2018   Lab Results  Component Value Date   RETICCTPCT 2.1 04/12/2017   RBC 4.38 10/11/2019   RETICCTABS 116.1 11/20/2013   No results found for: KPAFRELGTCHN, LAMBDASER, KAPLAMBRATIO No results found for: IGGSERUM, IGA, IGMSERUM No results found for: Kathrynn Ducking, MSPIKE, SPEI   Chemistry      Component Value Date/Time   NA 140 07/13/2019 0800   NA 140 02/08/2017 0925   NA 136 03/10/2016 1147   K 4.1 07/13/2019 0800   K 3.4 02/08/2017 0925   K 3.9 03/10/2016 1147   CL 106 07/13/2019 0800   CL 105 02/08/2017 0925   CO2 26 07/13/2019 0800   CO2 26 02/08/2017 0925   CO2 24 03/10/2016 1147   BUN 9 07/13/2019 0800   BUN 10 02/08/2017 0925   BUN 10.2 03/10/2016 1147   CREATININE 0.60 07/13/2019 0800   CREATININE 0.7 02/08/2017 0925   CREATININE 0.7 03/10/2016 1147      Component Value Date/Time   CALCIUM 10.7 (H) 07/13/2019 0800   CALCIUM 10.4 (H) 02/08/2017 0925   CALCIUM 10.7 (H) 03/10/2016 1147   ALKPHOS 124 07/13/2019 0800   ALKPHOS 106 (H) 02/08/2017 0925   ALKPHOS 132 03/10/2016 1147   AST 13 (L) 07/13/2019 0800   AST 12 03/10/2016 1147   ALT 7 07/13/2019 0800   ALT 16 02/08/2017 0925   ALT 8 03/10/2016 1147   BILITOT 0.5 07/13/2019 0800   BILITOT 0.63 03/10/2016 1147      Impression and Plan: Brianna Fletcher is a very pleasant 49 yo caucasian  female with a triple negative myeloproliferative syndrome though all work up has been negative so far.   I am happy that her blood counts really have been good.  Her white cell count really has not changed  while she has been off the interferon.  We do not make any changes for right now.  I looked at her blood smear.  I do not see anything that looked suspicious for any real "active" disease.  We will plan to see her back in 3 more months.  [name], MD 7/22/20218:43 AM

## 2019-10-11 NOTE — Telephone Encounter (Signed)
Appointments scheduled patient will get updates from My Chart per 7/21 los

## 2019-11-07 DIAGNOSIS — M79662 Pain in left lower leg: Secondary | ICD-10-CM | POA: Diagnosis not present

## 2019-11-07 DIAGNOSIS — M79672 Pain in left foot: Secondary | ICD-10-CM | POA: Diagnosis not present

## 2019-11-13 DIAGNOSIS — M79662 Pain in left lower leg: Secondary | ICD-10-CM | POA: Diagnosis not present

## 2019-11-13 DIAGNOSIS — M79672 Pain in left foot: Secondary | ICD-10-CM | POA: Diagnosis not present

## 2019-11-16 DIAGNOSIS — M67912 Unspecified disorder of synovium and tendon, left shoulder: Secondary | ICD-10-CM | POA: Diagnosis not present

## 2019-11-18 ENCOUNTER — Other Ambulatory Visit: Payer: Self-pay | Admitting: Family Medicine

## 2019-11-18 DIAGNOSIS — R600 Localized edema: Secondary | ICD-10-CM

## 2019-11-18 DIAGNOSIS — E041 Nontoxic single thyroid nodule: Secondary | ICD-10-CM

## 2019-11-18 DIAGNOSIS — D471 Chronic myeloproliferative disease: Secondary | ICD-10-CM

## 2019-11-21 ENCOUNTER — Telehealth: Payer: Self-pay | Admitting: Hematology & Oncology

## 2019-11-21 NOTE — Telephone Encounter (Signed)
Called and LMVM for patient advising that she needed to discuss her MRI report with her Ortho and per Dr Marin Olp he did not need to see her prior to appointments that have been scheduled

## 2020-01-21 ENCOUNTER — Other Ambulatory Visit: Payer: Self-pay

## 2020-01-21 ENCOUNTER — Telehealth: Payer: Self-pay | Admitting: Hematology & Oncology

## 2020-01-21 ENCOUNTER — Other Ambulatory Visit: Payer: Self-pay | Admitting: Family Medicine

## 2020-01-21 ENCOUNTER — Inpatient Hospital Stay: Payer: Medicare Other | Attending: Hematology & Oncology

## 2020-01-21 ENCOUNTER — Encounter: Payer: Self-pay | Admitting: Hematology & Oncology

## 2020-01-21 ENCOUNTER — Inpatient Hospital Stay (HOSPITAL_BASED_OUTPATIENT_CLINIC_OR_DEPARTMENT_OTHER): Payer: Medicare Other | Admitting: Hematology & Oncology

## 2020-01-21 VITALS — BP 129/73 | HR 66 | Temp 97.8°F | Resp 18 | Wt 233.0 lb

## 2020-01-21 DIAGNOSIS — E039 Hypothyroidism, unspecified: Secondary | ICD-10-CM | POA: Diagnosis not present

## 2020-01-21 DIAGNOSIS — D471 Chronic myeloproliferative disease: Secondary | ICD-10-CM

## 2020-01-21 DIAGNOSIS — I10 Essential (primary) hypertension: Secondary | ICD-10-CM

## 2020-01-21 DIAGNOSIS — K219 Gastro-esophageal reflux disease without esophagitis: Secondary | ICD-10-CM

## 2020-01-21 LAB — CMP (CANCER CENTER ONLY)
ALT: 10 U/L (ref 0–44)
AST: 18 U/L (ref 15–41)
Albumin: 4.6 g/dL (ref 3.5–5.0)
Alkaline Phosphatase: 132 U/L — ABNORMAL HIGH (ref 38–126)
Anion gap: 9 (ref 5–15)
BUN: 12 mg/dL (ref 6–20)
CO2: 27 mmol/L (ref 22–32)
Calcium: 11.3 mg/dL — ABNORMAL HIGH (ref 8.9–10.3)
Chloride: 103 mmol/L (ref 98–111)
Creatinine: 0.64 mg/dL (ref 0.44–1.00)
GFR, Estimated: >60 mL/min (ref >60)
Glucose, Bld: 107 mg/dL — ABNORMAL HIGH (ref 70–99)
Potassium: 3.7 mmol/L (ref 3.5–5.1)
Sodium: 139 mmol/L (ref 135–145)
Total Bilirubin: 0.4 mg/dL (ref 0.3–1.2)
Total Protein: 7.7 g/dL (ref 6.5–8.1)

## 2020-01-21 LAB — CBC WITH DIFFERENTIAL (CANCER CENTER ONLY)
Abs Immature Granulocytes: 0.25 10*3/uL — ABNORMAL HIGH (ref 0.00–0.07)
Basophils Absolute: 0.1 10*3/uL (ref 0.0–0.1)
Basophils Relative: 0 %
Eosinophils Absolute: 0.1 10*3/uL (ref 0.0–0.5)
Eosinophils Relative: 1 %
HCT: 44.4 % (ref 36.0–46.0)
Hemoglobin: 14.2 g/dL (ref 12.0–15.0)
Immature Granulocytes: 2 %
Lymphocytes Relative: 18 %
Lymphs Abs: 3 10*3/uL (ref 0.7–4.0)
MCH: 30 pg (ref 26.0–34.0)
MCHC: 32 g/dL (ref 30.0–36.0)
MCV: 93.9 fL (ref 80.0–100.0)
Monocytes Absolute: 0.9 10*3/uL (ref 0.1–1.0)
Monocytes Relative: 5 %
Neutro Abs: 12.7 10*3/uL — ABNORMAL HIGH (ref 1.7–7.7)
Neutrophils Relative %: 74 %
Platelet Count: 291 10*3/uL (ref 150–400)
RBC: 4.73 MIL/uL (ref 3.87–5.11)
RDW: 12.5 % (ref 11.5–15.5)
WBC Count: 17.1 10*3/uL — ABNORMAL HIGH (ref 4.0–10.5)
nRBC: 0 % (ref 0.0–0.2)

## 2020-01-21 LAB — LACTATE DEHYDROGENASE: LDH: 129 U/L (ref 98–192)

## 2020-01-21 LAB — SAVE SMEAR(SSMR), FOR PROVIDER SLIDE REVIEW

## 2020-01-21 NOTE — Telephone Encounter (Signed)
Appointments scheduled patient has My Chart Access per 11/1 los

## 2020-01-21 NOTE — Progress Notes (Signed)
Hematology and Oncology Follow Up Visit  NYRIE SIGAL [id number] 02/07/1971 49 y.o. 01/21/2020   Principle Diagnosis:  Chronic myeloproliferative syndrome-Triple negative Sub-clinical hypothyroidism (+) p-ANCA  Current Therapy:   Peg-Interferon q weekly - q 4 week dosing - On hold due to cost Synthroid 0.050 mg po q day   Interim History: Ms. Canche is here today for follow-up.  Unfortunately, she is under a lot of stress right now.  The big problem is that her son, up in [place], is having some issues with his girlfriend.  They have a daughter together.  Apparently, the girlfriend kicked him out of the house and she is now with a convicted felon.  Her son is try to get custody of their daughter.  Ms. Endicott also dealing with her mom.  Her mom has the dementia.  Her mom is declining slowly but surely.  She is still not ready for Hospice.  Ms. Jacques also has seen orthopedic surgery.  She had MRI of her left leg.  There was some marrow issues which is no surprise.  Otherwise everything else looked okay.  She had some mucus in the throat this morning.  She is worried that she may be coming down with an infection.  Her white cell count looks great.  It is not changed from what it was back in July.  I told her to try some over-the-counter Mucinex.  She has had no fever.  She has had no bleeding.  There is no change in bowel bladder habits.  Overall, her performance status is ECOG 1.    Medications:  Allergies as of 01/21/2020      Reactions   Lisinopril Cough   Losartan Cough   Lubiprostone Nausea And Vomiting      Medication List       Accurate as of January 21, 2020  9:19 AM. If you have any questions, ask your nurse or doctor.        amLODipine 10 MG tablet Commonly known as: NORVASC TAKE 1 TABLET BY MOUTH EVERY DAY   aspirin 81 MG tablet Take 81 mg by mouth daily.   chlorhexidine 0.12 % solution Commonly known as: PERIDEX SMARTSIG:By Mouth   diclofenac  Sodium 1 % Gel Commonly known as: VOLTAREN Apply topically.   Fish Oil 1200 MG Caps Take 1 capsule by mouth daily.   furosemide 20 MG tablet Commonly known as: LASIX TAKE 1 TABLET BY MOUTH EVERY DAY   levothyroxine 50 MCG tablet Commonly known as: SYNTHROID TAKE 1 TABLET (50 MCG TOTAL) BY MOUTH DAILY BEFORE BREAKFAST. NEEDS OV   Magnesium 500 MG Caps Take by mouth daily.   melatonin 5 MG Tabs Take 1 tablet by mouth daily.   omeprazole 20 MG capsule Commonly known as: PRILOSEC Take 1 capsule (20 mg total) by mouth daily.   vitamin C 1000 MG tablet Take by mouth daily.   VITAMIN D3 PO Take by mouth daily.   zinc gluconate 50 MG tablet Take 50 mg by mouth daily.       Allergies:  Allergies  Allergen Reactions  . Lisinopril Cough  . Losartan Cough  . Lubiprostone Nausea And Vomiting    Past Medical History, Surgical history, Social history, and Family History were reviewed and updated.  Review of Systems: Review of Systems  Constitutional: Positive for malaise/fatigue.  HENT: Negative.   Eyes: Negative.   Respiratory: Negative.   Cardiovascular: Negative.   Gastrointestinal: Positive for diarrhea.  Genitourinary: Negative.  Musculoskeletal: Positive for myalgias.  Skin: Negative.   Neurological: Positive for dizziness.  Psychiatric/Behavioral: Negative.      Physical Exam:  weight is 233 lb (105.7 kg). Her oral temperature is 97.8 F (36.6 C). Her blood pressure is 129/73 and her pulse is 66. Her respiration is 18 and oxygen saturation is 100%.   Wt Readings from Last 3 Encounters:  01/21/20 233 lb (105.7 kg)  10/11/19 240 lb (108.9 kg)  07/13/19 243 lb 12.8 oz (110.6 kg)    Physical Exam Vitals reviewed.  HENT:     Head: Normocephalic and atraumatic.  Eyes:     Pupils: Pupils are equal, round, and reactive to light.  Cardiovascular:     Rate and Rhythm: Normal rate and regular rhythm.     Heart sounds: Normal heart sounds.  Pulmonary:      Effort: Pulmonary effort is normal.     Breath sounds: Normal breath sounds.  Abdominal:     General: Bowel sounds are normal.     Palpations: Abdomen is soft.  Musculoskeletal:        General: No tenderness or deformity. Normal range of motion.     Cervical back: Normal range of motion.  Lymphadenopathy:     Cervical: No cervical adenopathy.  Skin:    General: Skin is warm and dry.     Findings: No erythema or rash.  Neurological:     Mental Status: She is alert and oriented to person, place, and time.  Psychiatric:        Behavior: Behavior normal.        Thought Content: Thought content normal.        Judgment: Judgment normal.      Lab Results  Component Value Date   WBC 17.1 (H) 01/21/2020   HGB 14.2 01/21/2020   HCT 44.4 01/21/2020   MCV 93.9 01/21/2020   PLT 291 01/21/2020   Lab Results  Component Value Date   FERRITIN 64 06/06/2018   IRON 82 06/06/2018   TIBC 338 06/06/2018   UIBC 256 06/06/2018   IRONPCTSAT 24 06/06/2018   Lab Results  Component Value Date   RETICCTPCT 2.1 04/12/2017   RBC 4.73 01/21/2020   RETICCTABS 116.1 11/20/2013   No results found for: KPAFRELGTCHN, LAMBDASER, KAPLAMBRATIO No results found for: IGGSERUM, IGA, IGMSERUM No results found for: Kathrynn Ducking, MSPIKE, SPEI   Chemistry      Component Value Date/Time   NA 139 01/21/2020 0817   NA 140 02/08/2017 0925   NA 136 03/10/2016 1147   K 3.7 01/21/2020 0817   K 3.4 02/08/2017 0925   K 3.9 03/10/2016 1147   CL 103 01/21/2020 0817   CL 105 02/08/2017 0925   CO2 27 01/21/2020 0817   CO2 26 02/08/2017 0925   CO2 24 03/10/2016 1147   BUN 12 01/21/2020 0817   BUN 10 02/08/2017 0925   BUN 10.2 03/10/2016 1147   CREATININE 0.64 01/21/2020 0817   CREATININE 0.7 02/08/2017 0925   CREATININE 0.7 03/10/2016 1147      Component Value Date/Time   CALCIUM 11.3 (H) 01/21/2020 0817   CALCIUM 10.4 (H) 02/08/2017 0925   CALCIUM 10.7 (H)  03/10/2016 1147   ALKPHOS 132 (H) 01/21/2020 0817   ALKPHOS 106 (H) 02/08/2017 0925   ALKPHOS 132 03/10/2016 1147   AST 18 01/21/2020 0817   AST 12 03/10/2016 1147   ALT 10 01/21/2020 0817   ALT 16 02/08/2017 0925  ALT 8 03/10/2016 1147   BILITOT 0.4 01/21/2020 0817   BILITOT 0.63 03/10/2016 1147      Impression and Plan: Ms. Alverson is a very pleasant 49 yo caucasian female with a triple negative myeloproliferative syndrome though all work up has been negative so far.   I am happy that her blood counts have not changed in 4 months.  Again, I think this mucus is not indicative of any infection.  I would think that over-the-counter Mucinex will help.  I really hope that everything gets sorted out with her son up in [place].  I realize this really is affecting Ms. Elko.  She just cannot go up there because she has to stay here to help with her mom.  We will certainly pray hard for her son.  I will also pray hard for Ms. Mcinturff.  She is incredibly nice.  She is so dedicated to her family.  She just wants to be in [place] to help but she cannot.  We will plan to get her back after the holidays.  I think this would be reasonable.    [name], MD 11/1/20219:19 AM

## 2020-01-24 ENCOUNTER — Ambulatory Visit: Payer: Medicare Other | Admitting: Family Medicine

## 2020-01-31 ENCOUNTER — Encounter: Payer: Self-pay | Admitting: Family Medicine

## 2020-01-31 ENCOUNTER — Ambulatory Visit (INDEPENDENT_AMBULATORY_CARE_PROVIDER_SITE_OTHER): Payer: Medicare Other | Admitting: Family Medicine

## 2020-01-31 ENCOUNTER — Other Ambulatory Visit: Payer: Self-pay

## 2020-01-31 VITALS — BP 120/82 | HR 64 | Temp 97.9°F | Resp 18 | Ht 66.0 in | Wt 235.2 lb

## 2020-01-31 DIAGNOSIS — E785 Hyperlipidemia, unspecified: Secondary | ICD-10-CM

## 2020-01-31 DIAGNOSIS — R600 Localized edema: Secondary | ICD-10-CM

## 2020-01-31 DIAGNOSIS — I10 Essential (primary) hypertension: Secondary | ICD-10-CM

## 2020-01-31 DIAGNOSIS — D471 Chronic myeloproliferative disease: Secondary | ICD-10-CM | POA: Diagnosis not present

## 2020-01-31 DIAGNOSIS — E041 Nontoxic single thyroid nodule: Secondary | ICD-10-CM | POA: Diagnosis not present

## 2020-01-31 DIAGNOSIS — E032 Hypothyroidism due to medicaments and other exogenous substances: Secondary | ICD-10-CM

## 2020-01-31 DIAGNOSIS — E039 Hypothyroidism, unspecified: Secondary | ICD-10-CM

## 2020-01-31 DIAGNOSIS — K219 Gastro-esophageal reflux disease without esophagitis: Secondary | ICD-10-CM

## 2020-01-31 LAB — LIPID PANEL
Cholesterol: 230 mg/dL — ABNORMAL HIGH (ref 0–200)
HDL: 53.7 mg/dL (ref >39.00)
NonHDL: 176.69
Total CHOL/HDL Ratio: 4
Triglycerides: 201 mg/dL — ABNORMAL HIGH (ref 0.0–149.0)
VLDL: 40.2 mg/dL — ABNORMAL HIGH (ref 0.0–40.0)

## 2020-01-31 LAB — LDL CHOLESTEROL, DIRECT: Direct LDL: 167 mg/dL

## 2020-01-31 LAB — TSH: TSH: 0.65 u[IU]/mL (ref 0.35–4.50)

## 2020-01-31 MED ORDER — AMLODIPINE BESYLATE 10 MG PO TABS: 10.0000 mg | ORAL_TABLET | Freq: Every day | ORAL | 1 refills | Status: DC

## 2020-01-31 MED ORDER — OMEPRAZOLE 20 MG PO CPDR: 20.0000 mg | DELAYED_RELEASE_CAPSULE | Freq: Every day | ORAL | 3 refills | Status: DC

## 2020-01-31 MED ORDER — LEVOTHYROXINE SODIUM 50 MCG PO TABS: 50.0000 ug | ORAL_TABLET | Freq: Every day | ORAL | 0 refills | Status: DC

## 2020-01-31 MED ORDER — FUROSEMIDE 20 MG PO TABS: 20.0000 mg | ORAL_TABLET | Freq: Every day | ORAL | 0 refills | Status: DC

## 2020-01-31 NOTE — Progress Notes (Signed)
Patient ID: Brianna Fletcher, female    DOB: Jul 22, 1970  Age: 49 y.o. MRN: [id number]    Subjective:  Subjective  HPI  Brianna Fletcher presents for thyroid f/u and labs   She is also c/o cough/ congestion that she has had for >3 weeks  She is taking no otc meds   Review of Systems  Constitutional: Negative for appetite change, diaphoresis, fatigue and unexpected weight change.  HENT: Positive for congestion.   Eyes: Negative for pain, redness and visual disturbance.  Respiratory: Positive for cough. Negative for chest tightness, shortness of breath and wheezing.   Cardiovascular: Negative for chest pain, palpitations and leg swelling.  Endocrine: Negative for cold intolerance, heat intolerance, polydipsia, polyphagia and polyuria.  Genitourinary: Negative for difficulty urinating, dysuria and frequency.  Neurological: Negative for dizziness, light-headedness, numbness and headaches.    History Past Medical History:  Diagnosis Date  . Arthritis   . Back pain   . Blood dyscrasia    produces too many WBCs  . Chronic neutrophilia 11/15/2012  . Depression   . Edema, lower extremity   . Family history of heart disease    mother had stents placed in her 15s and had myocardial infarctions  . Fibromyalgia   . GERD (gastroesophageal reflux disease)   . Glaucoma   . Glaucoma   . High cholesterol   . Hypertension   . IBS (irritable bowel syndrome)   . Joint pain   . Leukocytosis, unspecified 11/15/2012  . Monocytosis 11/15/2012  . Myeloproliferative disorder ([place]) 03/08/2013  . Precordial chest pain   . Pruritus 11/15/2012  . Radial artery occlusion, right (HCC)    status post  right radial artery cardiac catheterization  . Thyroid disease   . Vaginal delivery 1986, 2000  . Vitamin D deficiency     She has a past surgical history that includes Tubal ligation (2000); Eye surgery; Cardiac catheterization (N/A, 10/03/2014); Dilatation & currettage/hysteroscopy with novasure ablation (N/A,  11/08/2014); and Laparoscopic vaginal hysterectomy with salpingectomy (Bilateral, 03/05/2015).   Her family history includes Alcoholism in her father; Cancer in her father; Dementia in her maternal grandmother; Depression in her mother; Diabetes in her father; Heart disease in her cousin, maternal grandmother, and mother; Heart disease (age of onset: 69) in her maternal grandfather; Hyperlipidemia in her mother; Hypertension in her mother; Obesity in her mother; Sleep apnea in her mother.She reports that she has been smoking cigarettes. She started smoking about 5 years ago. She has smoked for the past 0.00 years. She has never used smokeless tobacco. She reports current alcohol use of about 6.0 standard drinks of alcohol per week. She reports that she does not use drugs.  Current Outpatient Medications on File Prior to Visit  Medication Sig Dispense Refill  . Ascorbic Acid (VITAMIN C) 1000 MG tablet Take by mouth daily.    Marland Kitchen aspirin 81 MG tablet Take 81 mg by mouth daily.    . chlorhexidine (PERIDEX) 0.12 % solution SMARTSIG:By Mouth    . Cholecalciferol (VITAMIN D3 PO) Take by mouth daily.     . Magnesium 500 MG CAPS Take by mouth daily.     . Melatonin 5 MG TABS Take 1 tablet by mouth daily.    . Omega-3 Fatty Acids (FISH OIL) 1200 MG CAPS Take 1 capsule by mouth daily.    Marland Kitchen zinc gluconate 50 MG tablet Take 50 mg by mouth daily.     Current Facility-Administered Medications on File Prior to Visit  Medication Dose  Route Frequency Provider Last Rate Last Admin  . acetaminophen (TYLENOL) tablet 650 mg  650 mg Oral Once [name], MD      . peginterferon alfa-2a (PEGASYS) injection 81 mcg  81 mcg Subcutaneous Weekly [name], MD   81 mcg at 04/26/14 1530     Objective:  Objective  Physical Exam Vitals and nursing note reviewed.  Constitutional:      Appearance: She is well-developed.  HENT:     Head: Normocephalic and atraumatic.  Eyes:     Conjunctiva/sclera: Conjunctivae  normal.  Neck:     Thyroid: No thyromegaly.     Vascular: No carotid bruit or JVD.  Cardiovascular:     Rate and Rhythm: Normal rate and regular rhythm.     Heart sounds: Normal heart sounds. No murmur heard.   Pulmonary:     Effort: Pulmonary effort is normal. No respiratory distress.     Breath sounds: Normal breath sounds. No wheezing or rales.  Chest:     Chest wall: No tenderness.  Musculoskeletal:     Cervical back: Normal range of motion and neck supple.  Neurological:     Mental Status: She is alert and oriented to person, place, and time.    BP 120/82 (BP Location: Right Arm, Patient Position: Sitting, Cuff Size: Large)   Pulse 64   Temp 97.9 F (36.6 C) (Oral)   Resp 18   Ht 5\' 6"  (1.676 m)   Wt 235 lb 3.2 oz (106.7 kg)   LMP  (LMP Unknown)   SpO2 98%   BMI 37.96 kg/m  Wt Readings from Last 3 Encounters:  01/31/20 235 lb 3.2 oz (106.7 kg)  01/21/20 233 lb (105.7 kg)  10/11/19 240 lb (108.9 kg)     Lab Results  Component Value Date   WBC 17.1 (H) 01/21/2020   HGB 14.2 01/21/2020   HCT 44.4 01/21/2020   PLT 291 01/21/2020   GLUCOSE 107 (H) 01/21/2020   CHOL 204 (H) 12/08/2018   TRIG 184.0 (H) 12/08/2018   HDL 41.90 12/08/2018   LDLDIRECT 120.0 12/08/2017   LDLCALC 126 (H) 12/08/2018   ALT 10 01/21/2020   AST 18 01/21/2020   NA 139 01/21/2020   K 3.7 01/21/2020   CL 103 01/21/2020   CREATININE 0.64 01/21/2020   BUN 12 01/21/2020   CO2 27 01/21/2020   TSH 0.64 12/08/2018   INR 0.9 05/06/2016   MICROALBUR <0.7 04/23/2016    MR SHOULDER LEFT WO CONTRAST  Result Date: 04/29/2019 CLINICAL DATA:  Left shoulder pain for 3 months. No known injury. History of chronic with neutrophilia and myeloproliferative disorder. EXAM: MRI OF THE LEFT SHOULDER WITHOUT CONTRAST TECHNIQUE: Multiplanar, multisequence MR imaging of the shoulder was performed. No intravenous contrast was administered. COMPARISON:  None. FINDINGS: Rotator cuff:  Intact with normal signal.  Muscles:  Normal without atrophy or focal lesion. Biceps long head:  Intact and normal in appearance. Acromioclavicular Joint: Normal. Type 1 acromion. No subacromial/subdeltoid bursal fluid. Glenohumeral Joint: Normal. Labrum:  Intact. Bones: Decreased T1 and intermediate increased T2 signal in all imaged bones is seen. No fracture. Other: None. IMPRESSION: Mild supraspinatus tendinopathy without tear. Marrow signal change in all imaged bones is compatible with the patient's history myeloproliferative disorder. Electronically Signed   By: Inge Rise M.D.   On: 04/29/2019 12:43     Assessment & Plan:  Plan  I have discontinued Aspasia H. Dionisio's diclofenac Sodium. I have also changed her furosemide and  amLODipine. Additionally, I am having her maintain her aspirin, Magnesium, vitamin C, Cholecalciferol (VITAMIN D3 PO), zinc gluconate, melatonin, Fish Oil, chlorhexidine, levothyroxine, and omeprazole.  Meds ordered this encounter  Medications  . levothyroxine (SYNTHROID) 50 MCG tablet    Sig: Take 1 tablet (50 mcg total) by mouth daily before breakfast. Needs ov    Dispense:  90 tablet    Refill:  0  . furosemide (LASIX) 20 MG tablet    Sig: Take 1 tablet (20 mg total) by mouth daily.    Dispense:  90 tablet    Refill:  0  . amLODipine (NORVASC) 10 MG tablet    Sig: Take 1 tablet (10 mg total) by mouth daily.    Dispense:  90 tablet    Refill:  1  . omeprazole (PRILOSEC) 20 MG capsule    Sig: Take 1 capsule (20 mg total) by mouth daily.    Dispense:  90 capsule    Refill:  3    Problem List Items Addressed This Visit      Unprioritized   Essential hypertension    Well controlled, no changes to meds. Encouraged heart healthy diet such as the DASH diet and exercise as tolerated.       Relevant Medications   furosemide (LASIX) 20 MG tablet   amLODipine (NORVASC) 10 MG tablet   Other Relevant Orders   Lipid panel   TSH   Hyperlipidemia    Encouraged heart healthy diet,  increase exercise, avoid trans fats, consider a krill oil cap daily      Relevant Medications   furosemide (LASIX) 20 MG tablet   amLODipine (NORVASC) 10 MG tablet   Hypothyroidism due to non-medication exogenous substances    Check labs today      Relevant Medications   levothyroxine (SYNTHROID) 50 MCG tablet   Lower extremity edema    Stable con't lasix      Relevant Medications   furosemide (LASIX) 20 MG tablet   Myeloproliferative disorder (HCC) (Chronic)   Relevant Medications   levothyroxine (SYNTHROID) 50 MCG tablet   Right thyroid nodule   Relevant Medications   levothyroxine (SYNTHROID) 50 MCG tablet    Other Visit Diagnoses    Hypothyroidism, unspecified type    -  Primary   Relevant Medications   levothyroxine (SYNTHROID) 50 MCG tablet   Other Relevant Orders   TSH   Gastroesophageal reflux disease       Relevant Medications   omeprazole (PRILOSEC) 20 MG capsule      Follow-up: Return in about 6 months (around 07/30/2020), or if symptoms worsen or fail to improve, for annual exam, fasting.  Ann Held, DO

## 2020-01-31 NOTE — Assessment & Plan Note (Signed)
Check labs today.

## 2020-01-31 NOTE — Assessment & Plan Note (Signed)
Encouraged heart healthy diet, increase exercise, avoid trans fats, consider a krill oil cap daily 

## 2020-01-31 NOTE — Assessment & Plan Note (Signed)
Stable con't lasix

## 2020-01-31 NOTE — Patient Instructions (Signed)
DASH Eating Plan DASH stands for "Dietary Approaches to Stop Hypertension." The DASH eating plan is a healthy eating plan that has been shown to reduce high blood pressure (hypertension). It may also reduce your risk for type 2 diabetes, heart disease, and stroke. The DASH eating plan may also help with weight loss. What are tips for following this plan?  General guidelines  Avoid eating more than 2,300 mg (milligrams) of salt (sodium) a day. If you have hypertension, you may need to reduce your sodium intake to 1,500 mg a day.  Limit alcohol intake to no more than 1 drink a day for nonpregnant women and 2 drinks a day for men. One drink equals 12 oz of beer, 5 oz of wine, or 1 oz of hard liquor.  Work with your health care provider to maintain a healthy body weight or to lose weight. Ask what an ideal weight is for you.  Get at least 30 minutes of exercise that causes your heart to beat faster (aerobic exercise) most days of the week. Activities may include walking, swimming, or biking.  Work with your health care provider or diet and nutrition specialist (dietitian) to adjust your eating plan to your individual calorie needs. Reading food labels   Check food labels for the amount of sodium per serving. Choose foods with less than 5 percent of the Daily Value of sodium. Generally, foods with less than 300 mg of sodium per serving fit into this eating plan.  To find whole grains, look for the word "whole" as the first word in the ingredient list. Shopping  Buy products labeled as "low-sodium" or "no salt added."  Buy fresh foods. Avoid canned foods and premade or frozen meals. Cooking  Avoid adding salt when cooking. Use salt-free seasonings or herbs instead of table salt or sea salt. Check with your health care provider or pharmacist before using salt substitutes.  Do not fry foods. Cook foods using healthy methods such as baking, boiling, grilling, and broiling instead.  Cook with  heart-healthy oils, such as olive, canola, soybean, or sunflower oil. Meal planning  Eat a balanced diet that includes: ? 5 or more servings of fruits and vegetables each day. At each meal, try to fill half of your plate with fruits and vegetables. ? Up to 6-8 servings of whole grains each day. ? Less than 6 oz of lean meat, poultry, or fish each day. A 3-oz serving of meat is about the same size as a deck of cards. One egg equals 1 oz. ? 2 servings of low-fat dairy each day. ? A serving of nuts, seeds, or beans 5 times each week. ? Heart-healthy fats. Healthy fats called Omega-3 fatty acids are found in foods such as flaxseeds and coldwater fish, like sardines, salmon, and mackerel.  Limit how much you eat of the following: ? Canned or prepackaged foods. ? Food that is high in trans fat, such as fried foods. ? Food that is high in saturated fat, such as fatty meat. ? Sweets, desserts, sugary drinks, and other foods with added sugar. ? Full-fat dairy products.  Do not salt foods before eating.  Try to eat at least 2 vegetarian meals each week.  Eat more home-cooked food and less restaurant, buffet, and fast food.  When eating at a restaurant, ask that your food be prepared with less salt or no salt, if possible. What foods are recommended? The items listed may not be a complete list. Talk with your dietitian about   what dietary choices are best for you. Grains Whole-grain or whole-wheat bread. Whole-grain or whole-wheat pasta. Brown rice. Oatmeal. Quinoa. Bulgur. Whole-grain and low-sodium cereals. Pita bread. Low-fat, low-sodium crackers. Whole-wheat flour tortillas. Vegetables Fresh or frozen vegetables (raw, steamed, roasted, or grilled). Low-sodium or reduced-sodium tomato and vegetable juice. Low-sodium or reduced-sodium tomato sauce and tomato paste. Low-sodium or reduced-sodium canned vegetables. Fruits All fresh, dried, or frozen fruit. Canned fruit in natural juice (without  added sugar). Meat and other protein foods Skinless chicken or turkey. Ground chicken or turkey. Pork with fat trimmed off. Fish and seafood. Egg whites. Dried beans, peas, or lentils. Unsalted nuts, nut butters, and seeds. Unsalted canned beans. Lean cuts of beef with fat trimmed off. Low-sodium, lean deli meat. Dairy Low-fat (1%) or fat-free (skim) milk. Fat-free, low-fat, or reduced-fat cheeses. Nonfat, low-sodium ricotta or cottage cheese. Low-fat or nonfat yogurt. Low-fat, low-sodium cheese. Fats and oils Soft margarine without trans fats. Vegetable oil. Low-fat, reduced-fat, or light mayonnaise and salad dressings (reduced-sodium). Canola, safflower, olive, soybean, and sunflower oils. Avocado. Seasoning and other foods Herbs. Spices. Seasoning mixes without salt. Unsalted popcorn and pretzels. Fat-free sweets. What foods are not recommended? The items listed may not be a complete list. Talk with your dietitian about what dietary choices are best for you. Grains Baked goods made with fat, such as croissants, muffins, or some breads. Dry pasta or rice meal packs. Vegetables Creamed or fried vegetables. Vegetables in a cheese sauce. Regular canned vegetables (not low-sodium or reduced-sodium). Regular canned tomato sauce and paste (not low-sodium or reduced-sodium). Regular tomato and vegetable juice (not low-sodium or reduced-sodium). Pickles. Olives. Fruits Canned fruit in a light or heavy syrup. Fried fruit. Fruit in cream or butter sauce. Meat and other protein foods Fatty cuts of meat. Ribs. Fried meat. Bacon. Sausage. Bologna and other processed lunch meats. Salami. Fatback. Hotdogs. Bratwurst. Salted nuts and seeds. Canned beans with added salt. Canned or smoked fish. Whole eggs or egg yolks. Chicken or turkey with skin. Dairy Whole or 2% milk, cream, and half-and-half. Whole or full-fat cream cheese. Whole-fat or sweetened yogurt. Full-fat cheese. Nondairy creamers. Whipped toppings.  Processed cheese and cheese spreads. Fats and oils Butter. Stick margarine. Lard. Shortening. Ghee. Bacon fat. Tropical oils, such as coconut, palm kernel, or palm oil. Seasoning and other foods Salted popcorn and pretzels. Onion salt, garlic salt, seasoned salt, table salt, and sea salt. Worcestershire sauce. Tartar sauce. Barbecue sauce. Teriyaki sauce. Soy sauce, including reduced-sodium. Steak sauce. Canned and packaged gravies. Fish sauce. Oyster sauce. Cocktail sauce. Horseradish that you find on the shelf. Ketchup. Mustard. Meat flavorings and tenderizers. Bouillon cubes. Hot sauce and Tabasco sauce. Premade or packaged marinades. Premade or packaged taco seasonings. Relishes. Regular salad dressings. Where to find more information:  National Heart, Lung, and Blood Institute: www.nhlbi.nih.gov  American Heart Association: www.heart.org Summary  The DASH eating plan is a healthy eating plan that has been shown to reduce high blood pressure (hypertension). It may also reduce your risk for type 2 diabetes, heart disease, and stroke.  With the DASH eating plan, you should limit salt (sodium) intake to 2,300 mg a day. If you have hypertension, you may need to reduce your sodium intake to 1,500 mg a day.  When on the DASH eating plan, aim to eat more fresh fruits and vegetables, whole grains, lean proteins, low-fat dairy, and heart-healthy fats.  Work with your health care provider or diet and nutrition specialist (dietitian) to adjust your eating plan to your   individual calorie needs. This information is not intended to replace advice given to you by your health care provider. Make sure you discuss any questions you have with your health care provider. Document Revised: 02/18/2017 Document Reviewed: 03/01/2016 Elsevier Patient Education  2020 Elsevier Inc.  

## 2020-01-31 NOTE — Assessment & Plan Note (Signed)
Well controlled, no changes to meds. Encouraged heart healthy diet such as the DASH diet and exercise as tolerated.  °

## 2020-03-14 NOTE — Progress Notes (Deleted)
[place] at [facility] [address] [place], [place] [address] [phone]  Date:  03/17/2020   Name:  Brianna Fletcher   DOB:  06-21-1970   MRN:  [id number]  PCP:  Ann Held, DO    Chief Complaint: No chief complaint on file.   History of Present Illness:  Brianna Fletcher is a 48 y.o. very pleasant female patient who presents with the following:  Patient of my partner Dr. Etter Sjogren, here today with concern of a breast issue I have not seen her myself previously Patient is followed by hematology for myeloproliferative syndrome Patient Active Problem List   Diagnosis Date Noted  . Lower extremity edema 01/31/2020  . E. coli infection 12/08/2017  . Hyperlipidemia 12/09/2016  . Hypothyroidism due to non-medication exogenous substances 09/23/2016  . Bruising 05/09/2016  . Change in stool habits 05/11/2015  . Rectal bleeding 05/11/2015  . S/P hysterectomy 03/05/2015  . LUQ pain 01/14/2015  . Constipation 01/14/2015  . Severe obesity (BMI >= 40) ([place]) 12/23/2014  . Morbid obesity ([place]) 12/23/2014  . Radial artery occlusion, right ([place]) 12/20/2014  . HTN (hypertension) 11/04/2014  . Right arm pain 11/04/2014  . Essential hypertension 11/04/2014  . Cough 11/04/2014  . Chest pain 10/01/2014  . Vitamin D deficiency 08/26/2014  . Cramp in lower leg 08/26/2014  . Right thyroid nodule 07/22/2014  . Hyperparathyroidism ([place]) 07/22/2014  . Myeloproliferative disorder ([place]) 03/08/2013  . Leukocytosis 11/15/2012  . Chronic neutrophilia 11/15/2012  . Pruritus 11/15/2012  . Monocytosis 11/15/2012    Past Medical History:  Diagnosis Date  . Arthritis   . Back pain   . Blood dyscrasia    produces too many WBCs  . Chronic neutrophilia 11/15/2012  . Depression   . Edema, lower extremity   . Family history of heart disease    mother had stents placed in her 11s and had myocardial infarctions  . Fibromyalgia   . GERD  (gastroesophageal reflux disease)   . Glaucoma   . Glaucoma   . High cholesterol   . Hypertension   . IBS (irritable bowel syndrome)   . Joint pain   . Leukocytosis, unspecified 11/15/2012  . Monocytosis 11/15/2012  . Myeloproliferative disorder ([place]) 03/08/2013  . Precordial chest pain   . Pruritus 11/15/2012  . Radial artery occlusion, right (H[facility])    status post  right radial artery cardiac catheterization  . Thyroid disease   . Vaginal delivery 1986, 2000  . Vitamin D deficiency     Past Surgical History:  Procedure Laterality Date  . CARDIAC CATHETERIZATION N/A 10/03/2014   Procedure: Left Heart Cath and Coronary Angiography;  Surgeon: Lorretta Harp, MD;  Location: [facility];  Service: Cardiovascular;  Laterality: N/A;  . DILITATION & CURRETTAGE/HYSTROSCOPY WITH NOVASURE ABLATION N/A 11/08/2014   Procedure: DILATATION & CURETTAGE/HYSTEROSCOPY WITH NOVASURE ABLATION;  Surgeon: Linda Hedges, DO;  Location: [facility];  Service: Gynecology;  Laterality: N/A;  . EYE SURGERY     laser eye surgery  . LAPAROSCOPIC VAGINAL HYSTERECTOMY WITH SALPINGECTOMY Bilateral 03/05/2015   Procedure: LAPAROSCOPIC ASSISTED VAGINAL HYSTERECTOMY WITH SALPINGECTOMY;  Surgeon: Linda Hedges, DO;  Location: [facility];  Service: Gynecology;  Laterality: Bilateral;  . TUBAL LIGATION  2000    Social History   Tobacco Use  . Smoking status: Current Every Day Smoker    Years: 0.00    Types: Cigarettes    Start date: 08/21/2014  .  Smokeless tobacco: Never Used  . Tobacco comment: patient reports light tobacco use, has not smoked since friday (09/27/14)  Vaping Use  . Vaping Use: Never used  Substance Use Topics  . Alcohol use: Yes    Alcohol/week: 6.0 standard drinks    Types: 6 Cans of beer per week  . Drug use: No    Family History  Problem Relation Age of Onset  . Heart disease Mother   . Hypertension Mother   . Hyperlipidemia Mother   . Depression Mother   . Sleep apnea Mother   . Obesity  Mother   . Diabetes Father   . Cancer Father        esopageal ?  . Alcoholism Father   . Dementia Maternal Grandmother   . Heart disease Maternal Grandmother   . Heart disease Maternal Grandfather 48       MI  . Heart disease Cousin   . Other Neg Hx        hyperparathyroidism    Allergies  Allergen Reactions  . Lisinopril Cough  . Losartan Cough  . Lubiprostone Nausea And Vomiting    Medication list has been reviewed and updated.  Current Outpatient Medications on File Prior to Visit  Medication Sig Dispense Refill  . amLODipine (NORVASC) 10 MG tablet Take 1 tablet (10 mg total) by mouth daily. 90 tablet 1  . Ascorbic Acid (VITAMIN C) 1000 MG tablet Take by mouth daily.    Marland Kitchen aspirin 81 MG tablet Take 81 mg by mouth daily.    . chlorhexidine (PERIDEX) 0.12 % solution SMARTSIG:By Mouth    . Cholecalciferol (VITAMIN D3 PO) Take by mouth daily.     . furosemide (LASIX) 20 MG tablet Take 1 tablet (20 mg total) by mouth daily. 90 tablet 0  . levothyroxine (SYNTHROID) 50 MCG tablet Take 1 tablet (50 mcg total) by mouth daily before breakfast. Needs ov 90 tablet 0  . Magnesium 500 MG CAPS Take by mouth daily.     . Melatonin 5 MG TABS Take 1 tablet by mouth daily.    . Omega-3 Fatty Acids (FISH OIL) 1200 MG CAPS Take 1 capsule by mouth daily.    Marland Kitchen omeprazole (PRILOSEC) 20 MG capsule Take 1 capsule (20 mg total) by mouth daily. 90 capsule 3  . zinc gluconate 50 MG tablet Take 50 mg by mouth daily.     Current Facility-Administered Medications on File Prior to Visit  Medication Dose Route Frequency Provider Last Rate Last Admin  . acetaminophen (TYLENOL) tablet 650 mg  650 mg Oral Once [name], MD      . peginterferon alfa-2a (PEGASYS) injection 81 mcg  81 mcg Subcutaneous Weekly [name], MD   81 mcg at 04/26/14 1530    Review of Systems:  As per HPI- otherwise negative.   Physical Examination: There were no vitals filed for this visit. There were no vitals  filed for this visit. There is no height or weight on file to calculate BMI. Ideal Body Weight:    ***  Assessment and Plan: ***  Signed Lamar Blinks, MD

## 2020-03-17 ENCOUNTER — Ambulatory Visit: Payer: Medicare Other | Admitting: Family Medicine

## 2020-03-19 NOTE — Progress Notes (Deleted)
[place] at [facility] [address], [place], [place] [address] [phone]  Date:  03/20/2020   Name:  Brianna Fletcher   DOB:  1971-02-24   MRN:  [id number]  PCP:  Ann Held, DO    Chief Complaint: No chief complaint on file.   History of Present Illness:  Brianna Fletcher is a 49 y.o. very pleasant female patient who presents with the following:  Pt of DR Etter Sjogren here today with a breast concern History of obesity, hypothyroidism, HTN, myeloproliferative disorder  Most recent mammo 2019  Patient Active Problem List   Diagnosis Date Noted  . Lower extremity edema 01/31/2020  . E. coli infection 12/08/2017  . Hyperlipidemia 12/09/2016  . Hypothyroidism due to non-medication exogenous substances 09/23/2016  . Bruising 05/09/2016  . Change in stool habits 05/11/2015  . Rectal bleeding 05/11/2015  . S/P hysterectomy 03/05/2015  . LUQ pain 01/14/2015  . Constipation 01/14/2015  . Severe obesity (BMI >= 40) ([place]) 12/23/2014  . Morbid obesity ([place]) 12/23/2014  . Radial artery occlusion, right ([place]) 12/20/2014  . HTN (hypertension) 11/04/2014  . Right arm pain 11/04/2014  . Essential hypertension 11/04/2014  . Cough 11/04/2014  . Chest pain 10/01/2014  . Vitamin D deficiency 08/26/2014  . Cramp in lower leg 08/26/2014  . Right thyroid nodule 07/22/2014  . Hyperparathyroidism ([place]) 07/22/2014  . Myeloproliferative disorder ([place]) 03/08/2013  . Leukocytosis 11/15/2012  . Chronic neutrophilia 11/15/2012  . Pruritus 11/15/2012  . Monocytosis 11/15/2012    Past Medical History:  Diagnosis Date  . Arthritis   . Back pain   . Blood dyscrasia    produces too many WBCs  . Chronic neutrophilia 11/15/2012  . Depression   . Edema, lower extremity   . Family history of heart disease    mother had stents placed in her 49s and had myocardial infarctions  . Fibromyalgia   . GERD (gastroesophageal reflux disease)   .  Glaucoma   . Glaucoma   . High cholesterol   . Hypertension   . IBS (irritable bowel syndrome)   . Joint pain   . Leukocytosis, unspecified 11/15/2012  . Monocytosis 11/15/2012  . Myeloproliferative disorder ([place]) 03/08/2013  . Precordial chest pain   . Pruritus 11/15/2012  . Radial artery occlusion, right (HCC)    status post  right radial artery cardiac catheterization  . Thyroid disease   . Vaginal delivery 1986, 2000  . Vitamin D deficiency     Past Surgical History:  Procedure Laterality Date  . CARDIAC CATHETERIZATION N/A 10/03/2014   Procedure: Left Heart Cath and Coronary Angiography;  Surgeon: Lorretta Harp, MD;  Location: [facility];  Service: Cardiovascular;  Laterality: N/A;  . DILITATION & CURRETTAGE/HYSTROSCOPY WITH NOVASURE ABLATION N/A 11/08/2014   Procedure: DILATATION & CURETTAGE/HYSTEROSCOPY WITH NOVASURE ABLATION;  Surgeon: Linda Hedges, DO;  Location: [facility];  Service: Gynecology;  Laterality: N/A;  . EYE SURGERY     laser eye surgery  . LAPAROSCOPIC VAGINAL HYSTERECTOMY WITH SALPINGECTOMY Bilateral 03/05/2015   Procedure: LAPAROSCOPIC ASSISTED VAGINAL HYSTERECTOMY WITH SALPINGECTOMY;  Surgeon: Linda Hedges, DO;  Location: [facility];  Service: Gynecology;  Laterality: Bilateral;  . TUBAL LIGATION  2000    Social History   Tobacco Use  . Smoking status: Current Every Day Smoker    Years: 0.00    Types: Cigarettes    Start date: 08/21/2014  . Smokeless tobacco: Never Used  .  Tobacco comment: patient reports light tobacco use, has not smoked since friday (09/27/14)  Vaping Use  . Vaping Use: Never used  Substance Use Topics  . Alcohol use: Yes    Alcohol/week: 6.0 standard drinks    Types: 6 Cans of beer per week  . Drug use: No    Family History  Problem Relation Age of Onset  . Heart disease Mother   . Hypertension Mother   . Hyperlipidemia Mother   . Depression Mother   . Sleep apnea Mother   . Obesity Mother   . Diabetes Father   . Cancer  Father        esopageal ?  . Alcoholism Father   . Dementia Maternal Grandmother   . Heart disease Maternal Grandmother   . Heart disease Maternal Grandfather 50       MI  . Heart disease Cousin   . Other Neg Hx        hyperparathyroidism    Allergies  Allergen Reactions  . Lisinopril Cough  . Losartan Cough  . Lubiprostone Nausea And Vomiting    Medication list has been reviewed and updated.  Current Outpatient Medications on File Prior to Visit  Medication Sig Dispense Refill  . amLODipine (NORVASC) 10 MG tablet Take 1 tablet (10 mg total) by mouth daily. 90 tablet 1  . Ascorbic Acid (VITAMIN C) 1000 MG tablet Take by mouth daily.    Marland Kitchen aspirin 81 MG tablet Take 81 mg by mouth daily.    . chlorhexidine (PERIDEX) 0.12 % solution SMARTSIG:By Mouth    . Cholecalciferol (VITAMIN D3 PO) Take by mouth daily.     . furosemide (LASIX) 20 MG tablet Take 1 tablet (20 mg total) by mouth daily. 90 tablet 0  . levothyroxine (SYNTHROID) 50 MCG tablet Take 1 tablet (50 mcg total) by mouth daily before breakfast. Needs ov 90 tablet 0  . Magnesium 500 MG CAPS Take by mouth daily.     . Melatonin 5 MG TABS Take 1 tablet by mouth daily.    . Omega-3 Fatty Acids (FISH OIL) 1200 MG CAPS Take 1 capsule by mouth daily.    Marland Kitchen omeprazole (PRILOSEC) 20 MG capsule Take 1 capsule (20 mg total) by mouth daily. 90 capsule 3  . zinc gluconate 50 MG tablet Take 50 mg by mouth daily.     Current Facility-Administered Medications on File Prior to Visit  Medication Dose Route Frequency Provider Last Rate Last Admin  . acetaminophen (TYLENOL) tablet 650 mg  650 mg Oral Once Josph Macho, MD      . peginterferon alfa-2a (PEGASYS) injection 81 mcg  81 mcg Subcutaneous Weekly Josph Macho, MD   81 mcg at 04/26/14 1530    Review of Systems:  As per HPI- otherwise negative.   Physical Examination: There were no vitals filed for this visit. There were no vitals filed for this visit. There is no  height or weight on file to calculate BMI. Ideal Body Weight:    GEN: no acute distress. HEENT: Atraumatic, Normocephalic.  Ears and Nose: No external deformity. CV: RRR, No M/G/R. No JVD. No thrill. No extra heart sounds. PULM: CTA B, no wheezes, crackles, rhonchi. No retractions. No resp. distress. No accessory muscle use. ABD: S, NT, ND, +BS. No rebound. No HSM. EXTR: No c/c/e PSYCH: Normally interactive. Conversant.    Assessment and Plan: *** This visit occurred during the SARS-CoV-2 public health emergency.  Safety protocols were in place, including screening  questions prior to the visit, additional usage of staff PPE, and extensive cleaning of exam room while observing appropriate contact time as indicated for disinfecting solutions.    Signed Lamar Blinks, MD

## 2020-03-20 ENCOUNTER — Ambulatory Visit: Payer: Medicare Other | Admitting: Family Medicine

## 2020-03-25 NOTE — Progress Notes (Signed)
[facility] [employer] [place], [place], [place] [address] [phone]  Date:  03/27/2020   Name:  Brianna Fletcher   DOB:  06-May-1970   MRN:  [id number]  PCP:  Ann Held, DO    Chief Complaint: Breast Concern (Under breast, upper abdominal lump, has had for years but recently became bothersome. )   History of Present Illness:  Brianna Fletcher is a 50 y.o. very pleasant female patient who presents with the following:  Patient with history of hypertension, hypothyroidism, hyperlipidemia, Chronic myeloproliferative syndrome-Triple negative treated by Dr Carmie End follows up with him on a regular basis Here today with concern of bulge on her abdominal wall -not actually her breast concern as it was previously documented  I have not seen this patient previously  mammo 10/2017 ok- she is overdue for screening and is glad to arrange this as soon as possible  covid done including booster  The bump on her abd wall has been present for maybe a year. She is not sure if this is getting larger.  It is generally only visible if she is standing and looking in the mirror, such as after shower Over the holidays this spot became uncomfortable and she felt a "weird" feeling thought her belly and was having pains off and on.  The pains are now resolved.  She was also under a lot of stress over the holidays and thinks this may have been why she was feeling poorly.  Symptoms are now resolved, but she thought it would be good to get this area checked out anyway  No breast changes or concerns  No vomiting or diarrhea No abd operations except for BTL      Patient Active Problem List   Diagnosis Date Noted  . Lower extremity edema 01/31/2020  . E. coli infection 12/08/2017  . Hyperlipidemia 12/09/2016  . Hypothyroidism due to non-medication exogenous substances 09/23/2016  . Bruising 05/09/2016  . Change in stool habits 05/11/2015   . Rectal bleeding 05/11/2015  . S/P hysterectomy 03/05/2015  . LUQ pain 01/14/2015  . Constipation 01/14/2015  . Severe obesity (BMI >= 40) ([place]) 12/23/2014  . Morbid obesity ([place]) 12/23/2014  . Radial artery occlusion, right ([place]) 12/20/2014  . HTN (hypertension) 11/04/2014  . Right arm pain 11/04/2014  . Essential hypertension 11/04/2014  . Cough 11/04/2014  . Chest pain 10/01/2014  . Vitamin D deficiency 08/26/2014  . Cramp in lower leg 08/26/2014  . Right thyroid nodule 07/22/2014  . Hyperparathyroidism ([place]) 07/22/2014  . Myeloproliferative disorder ([place]) 03/08/2013  . Leukocytosis 11/15/2012  . Chronic neutrophilia 11/15/2012  . Pruritus 11/15/2012  . Monocytosis 11/15/2012    Past Medical History:  Diagnosis Date  . Arthritis   . Back pain   . Blood dyscrasia    produces too many WBCs  . Chronic neutrophilia 11/15/2012  . Depression   . Edema, lower extremity   . Family history of heart disease    mother had stents placed in her 41s and had myocardial infarctions  . Fibromyalgia   . GERD (gastroesophageal reflux disease)   . Glaucoma   . Glaucoma   . High cholesterol   . Hypertension   . IBS (irritable bowel syndrome)   . Joint pain   . Leukocytosis, unspecified 11/15/2012  . Monocytosis 11/15/2012  . Myeloproliferative disorder ([place]) 03/08/2013  . Precordial chest pain   . Pruritus 11/15/2012  . Radial artery  occlusion, right [facility])    status post  right radial artery cardiac catheterization  . Thyroid disease   . Vaginal delivery 1986, 2000  . Vitamin D deficiency     Past Surgical History:  Procedure Laterality Date  . CARDIAC CATHETERIZATION N/A 10/03/2014   Procedure: Left Heart Cath and Coronary Angiography;  Surgeon: Runell Gess, MD;  Location: [facility] INVASIVE CV LAB;  Service: Cardiovascular;  Laterality: N/A;  . DILITATION & CURRETTAGE/HYSTROSCOPY WITH NOVASURE ABLATION N/A 11/08/2014   Procedure: DILATATION & CURETTAGE/HYSTEROSCOPY WITH NOVASURE  ABLATION;  Surgeon: Mitchel Honour, DO;  Location: [facility] ORS;  Service: Gynecology;  Laterality: N/A;  . EYE SURGERY     laser eye surgery  . LAPAROSCOPIC VAGINAL HYSTERECTOMY WITH SALPINGECTOMY Bilateral 03/05/2015   Procedure: LAPAROSCOPIC ASSISTED VAGINAL HYSTERECTOMY WITH SALPINGECTOMY;  Surgeon: Mitchel Honour, DO;  Location: [facility] ORS;  Service: Gynecology;  Laterality: Bilateral;  . TUBAL LIGATION  2000    Social History   Tobacco Use  . Smoking status: Current Every Day Smoker    Years: 0.00    Types: Cigarettes    Start date: 08/21/2014  . Smokeless tobacco: Never Used  . Tobacco comment: patient reports light tobacco use, has not smoked since friday (09/27/14)  Vaping Use  . Vaping Use: Never used  Substance Use Topics  . Alcohol use: Yes    Alcohol/week: 6.0 standard drinks    Types: 6 Cans of beer per week  . Drug use: No    Family History  Problem Relation Age of Onset  . Heart disease Mother   . Hypertension Mother   . Hyperlipidemia Mother   . Depression Mother   . Sleep apnea Mother   . Obesity Mother   . Diabetes Father   . Cancer Father        esopageal ?  . Alcoholism Father   . Dementia Maternal Grandmother   . Heart disease Maternal Grandmother   . Heart disease Maternal Grandfather 50       MI  . Heart disease Cousin   . Other Neg Hx        hyperparathyroidism    Allergies  Allergen Reactions  . Lisinopril Cough  . Losartan Cough  . Lubiprostone Nausea And Vomiting    Medication list has been reviewed and updated.  Current Outpatient Medications on File Prior to Visit  Medication Sig Dispense Refill  . amLODipine (NORVASC) 10 MG tablet Take 1 tablet (10 mg total) by mouth daily. 90 tablet 1  . Ascorbic Acid (VITAMIN C) 1000 MG tablet Take by mouth daily.    Marland Kitchen aspirin 81 MG tablet Take 81 mg by mouth daily.    . chlorhexidine (PERIDEX) 0.12 % solution SMARTSIG:By Mouth    . Cholecalciferol (VITAMIN D3 PO) Take by mouth daily.     . furosemide  (LASIX) 20 MG tablet Take 1 tablet (20 mg total) by mouth daily. 90 tablet 0  . levothyroxine (SYNTHROID) 50 MCG tablet Take 1 tablet (50 mcg total) by mouth daily before breakfast. Needs ov 90 tablet 0  . Magnesium 500 MG CAPS Take by mouth daily.     . Melatonin 5 MG TABS Take 1 tablet by mouth daily.    . Omega-3 Fatty Acids (FISH OIL) 1200 MG CAPS Take 1 capsule by mouth daily.    Marland Kitchen omeprazole (PRILOSEC) 20 MG capsule Take 1 capsule (20 mg total) by mouth daily. 90 capsule 3  . zinc gluconate 50 MG tablet Take 50 mg by  mouth daily.     Current Facility-Administered Medications on File Prior to Visit  Medication Dose Route Frequency Provider Last Rate Last Admin  . acetaminophen (TYLENOL) tablet 650 mg  650 mg Oral Once [name], MD      . peginterferon alfa-2a (PEGASYS) injection 81 mcg  81 mcg Subcutaneous Weekly [name], MD   81 mcg at 04/26/14 1530    Review of Systems:  As per HPI- otherwise negative.   Physical Examination: Vitals:   03/27/20 0958  BP: 122/82  Pulse: 82  Resp: 17  SpO2: 98%   Vitals:   03/27/20 0958  Weight: 235 lb (106.6 kg)  Height: 5\' 6"  (1.676 m)   Body mass index is 37.93 kg/m. Ideal Body Weight: Weight in (lb) to have BMI = 25: 154.6  GEN: no acute distress.  Obese, otherwise looks well HEENT: Atraumatic, Normocephalic.  Ears and Nose: No external deformity. CV: RRR, No M/G/R. No JVD. No thrill. No extra heart sounds. PULM: CTA B, no wheezes, crackles, rhonchi. No retractions. No resp. distress. No accessory muscle use. ABD: S, NT, ND, +BS. No rebound. No HSM. She has a firm, ovoid apparent soft tissue mass over the left upper abdomen.  It does not involve the breast tissue.  Approximately 4 x 3 cm in size by palpation.  It is nontender, there is no redness or swelling.  Feels most consistent with lipoma I do not feel any increasing bulge with Valsalva to suggest a hernia Abdominal exam otherwise normal EXTR: No  c/c/e PSYCH: Normally interactive. Conversant.  Bilateral breast exam normal, no masses, dimpling, discharge noted  Assessment and Plan: Encounter for screening mammogram for malignant neoplasm of breast - Plan: MM 3D SCREEN BREAST BILATERAL, CANCELED: MS DIGITAL SCREENING TOMO BILATERAL  Mass of soft tissue of abdomen - Plan: US Abdomen Limited   Patient here today with concern of soft tissue mass which seem to be tender over the Christmas holiday.  Tenderness has now resolved but the mass persist.  Most suspicious for benign lipoma, but we will certainly obtain ultrasound imaging and try to definitively identify.  I have ordered ultrasound for patient, is inconclusive we can proceed to CT scan.  Also ordered screening mammogram  She will let us know if any pain or other symptoms return This visit occurred during the SARS-CoV-2 public health emergency.  Safety protocols were in place, including screening questions prior to the visit, additional usage of staff PPE, and extensive cleaning of exam room while observing appropriate contact time as indicated for disinfecting solutions.    Signed Lamar Blinks, MD

## 2020-03-27 ENCOUNTER — Encounter: Payer: Self-pay | Admitting: Family Medicine

## 2020-03-27 ENCOUNTER — Ambulatory Visit (INDEPENDENT_AMBULATORY_CARE_PROVIDER_SITE_OTHER): Payer: Medicare Other | Admitting: Family Medicine

## 2020-03-27 ENCOUNTER — Other Ambulatory Visit: Payer: Self-pay

## 2020-03-27 VITALS — BP 122/82 | HR 82 | Resp 17 | Ht 66.0 in | Wt 235.0 lb

## 2020-03-27 DIAGNOSIS — R1900 Intra-abdominal and pelvic swelling, mass and lump, unspecified site: Secondary | ICD-10-CM

## 2020-03-27 DIAGNOSIS — Z1231 Encounter for screening mammogram for malignant neoplasm of breast: Secondary | ICD-10-CM | POA: Diagnosis not present

## 2020-03-27 NOTE — Patient Instructions (Signed)
I suspect the bump you have noticed on your belly is a benign lipoma- however, let's get an ultrasound to make sure I would also suggest that you catch up on your mammogram Please go to imaging on the ground floor and see if they can do your ultrasound now or schedule for later, and set up your mammo appt I will be in touch with your results!  If pain returns please let us know

## 2020-04-01 DIAGNOSIS — Z20822 Contact with and (suspected) exposure to covid-19: Secondary | ICD-10-CM | POA: Diagnosis not present

## 2020-04-02 ENCOUNTER — Other Ambulatory Visit: Payer: Self-pay

## 2020-04-02 ENCOUNTER — Ambulatory Visit (HOSPITAL_BASED_OUTPATIENT_CLINIC_OR_DEPARTMENT_OTHER)
Admission: RE | Admit: 2020-04-02 | Discharge: 2020-04-02 | Disposition: A | Discharge status: Home / Self Care | Payer: Medicare Other | Source: Ambulatory Visit | Attending: Family Medicine | Admitting: Family Medicine

## 2020-04-02 DIAGNOSIS — R1900 Intra-abdominal and pelvic swelling, mass and lump, unspecified site: Secondary | ICD-10-CM | POA: Insufficient documentation

## 2020-04-02 DIAGNOSIS — R1904 Left lower quadrant abdominal swelling, mass and lump: Secondary | ICD-10-CM | POA: Diagnosis not present

## 2020-04-03 ENCOUNTER — Encounter: Payer: Self-pay | Admitting: Family Medicine

## 2020-04-10 ENCOUNTER — Inpatient Hospital Stay (HOSPITAL_BASED_OUTPATIENT_CLINIC_OR_DEPARTMENT_OTHER): Admission: RE | Admit: 2020-04-10 | Payer: Medicare Other | Source: Ambulatory Visit

## 2020-04-11 ENCOUNTER — Ambulatory Visit (HOSPITAL_BASED_OUTPATIENT_CLINIC_OR_DEPARTMENT_OTHER): Payer: Medicare Other

## 2020-04-14 DIAGNOSIS — U071 COVID-19: Secondary | ICD-10-CM | POA: Diagnosis not present

## 2020-04-14 DIAGNOSIS — Z20822 Contact with and (suspected) exposure to covid-19: Secondary | ICD-10-CM | POA: Diagnosis not present

## 2020-04-18 ENCOUNTER — Telehealth: Payer: Self-pay

## 2020-04-18 ENCOUNTER — Other Ambulatory Visit: Payer: Self-pay

## 2020-04-18 ENCOUNTER — Telehealth: Payer: Self-pay | Admitting: *Deleted

## 2020-04-18 ENCOUNTER — Telehealth (INDEPENDENT_AMBULATORY_CARE_PROVIDER_SITE_OTHER): Payer: Medicare Other | Admitting: Physician Assistant

## 2020-04-18 ENCOUNTER — Telehealth: Payer: Self-pay | Admitting: Family Medicine

## 2020-04-18 DIAGNOSIS — U071 COVID-19: Secondary | ICD-10-CM | POA: Diagnosis not present

## 2020-04-18 MED ORDER — AZELASTINE HCL 0.1 % NA SOLN: 2.0000 | Freq: Two times a day (BID) | NASAL | Status: AC

## 2020-04-18 MED ORDER — BENZONATATE 100 MG PO CAPS: 100.0000 mg | ORAL_CAPSULE | Freq: Three times a day (TID) | ORAL | 0 refills | Status: AC | PRN

## 2020-04-18 NOTE — Telephone Encounter (Signed)
Call received from patient stating that she tested positive for Covid-19 on 04/14/20 and will need to reschedule next weeks appts.  Dr. Marin Olp notified and message sent to scheduling.

## 2020-04-18 NOTE — Telephone Encounter (Signed)
Per inbasket message, please r/s due to covid, called and lm with new appt    Brianna Fletcher

## 2020-04-18 NOTE — Progress Notes (Signed)
Virtual Visit via Video Note  I connected with Brianna Fletcher on 04/18/20 at  4:00 PM EST by a video enabled telemedicine application and verified that I am speaking with the correct person using two identifiers.  Location: Patient: home Provider: office   I discussed the limitations of evaluation and management by telemedicine and the availability of in person appointments. The patient expressed understanding and agreed to proceed. Only the patient and myself were present for today's video call.   History of Present Illness:  Pt is a 50 yo female presenting via video call for COVID-19 symptoms. She states she tested positive for COVID on 04-14-20, but had symptoms starting on 04-09-20. She has had two Pfizer vaccines plus a booster. She takes care of and lives with her mother who has dementia and has also tested positive for COVID.  Her symptoms today include sore throat, congestion, and mild cough. Denies SOB, chest pain, headache, dizziness, or extreme fatigue.  Treatments she has tried includes Mucinex capsules and fluids. She has not needed Tylenol or Motrin at all.   Observations/Objective:  BP 133/70 HR 69 bpm SpO2 is 100%  Gen: Awake, alert, no acute distress Resp: Breathing is even and non-labored Psych: calm/pleasant demeanor Neuro: Alert and Oriented x 3, + facial symmetry, speech is clear.   Assessment and Plan:  COVID-19 in vaccinated individual. Mild, usual symptoms. In no acute distress. She will continue to quarantine as directed and monitor her V/S and symptoms. Supportive care advised. Rx for tessalon perles for cough and nasal spray. ER this weekend for any sudden changes.   Follow Up Instructions:   I discussed the assessment and treatment plan with the patient. The patient was provided an opportunity to ask questions and all were answered. The patient agreed with the plan and demonstrated an understanding of the instructions.   The patient was advised to call  back or seek an in-person evaluation if the symptoms worsen or if the condition fails to improve as anticipated.  Gitty Osterlund M Jonnell Hentges, PA-C

## 2020-04-18 NOTE — Telephone Encounter (Signed)
Recommend virtual visit please.

## 2020-04-18 NOTE — Telephone Encounter (Signed)
Caller : Alane Call Back @ [phone]  Patient states she was dx with covid 64, yesterday however, patient states she has been sick for 1 week now.  patient states she fully vaccinated . Patient would like  Dr Ivy Lynn recommendations.   Patient has a sore throat and congestion today.  Please advise

## 2020-04-18 NOTE — Patient Instructions (Signed)
Call the office if persistent or change in symptoms. Continue supportive care at home at this time.

## 2020-04-21 DIAGNOSIS — U071 COVID-19: Secondary | ICD-10-CM | POA: Diagnosis not present

## 2020-04-22 ENCOUNTER — Other Ambulatory Visit: Payer: Medicare Other

## 2020-04-22 ENCOUNTER — Ambulatory Visit: Payer: Medicare Other | Admitting: Hematology & Oncology

## 2020-04-23 DIAGNOSIS — Z20822 Contact with and (suspected) exposure to covid-19: Secondary | ICD-10-CM | POA: Diagnosis not present

## 2020-04-30 ENCOUNTER — Other Ambulatory Visit: Payer: Self-pay

## 2020-04-30 ENCOUNTER — Encounter: Payer: Self-pay | Admitting: Family Medicine

## 2020-04-30 ENCOUNTER — Telehealth (INDEPENDENT_AMBULATORY_CARE_PROVIDER_SITE_OTHER): Payer: Medicare Other | Admitting: Family Medicine

## 2020-04-30 DIAGNOSIS — U099 Post covid-19 condition, unspecified: Secondary | ICD-10-CM | POA: Diagnosis not present

## 2020-04-30 DIAGNOSIS — R053 Chronic cough: Secondary | ICD-10-CM | POA: Diagnosis not present

## 2020-04-30 DIAGNOSIS — J069 Acute upper respiratory infection, unspecified: Secondary | ICD-10-CM | POA: Insufficient documentation

## 2020-04-30 MED ORDER — LEVOCETIRIZINE DIHYDROCHLORIDE 5 MG PO TABS: 5.0000 mg | ORAL_TABLET | Freq: Every evening | ORAL | 5 refills | Status: DC

## 2020-04-30 NOTE — Progress Notes (Signed)
Virtual Visit via Video Note  I connected with Brianna Fletcher on 04/30/20 at 10:40 AM EST by a video enabled telemedicine application and verified that I am speaking with the correct person using two identifiers.  Location: Patient: home with mother  Provider: office    I discussed the limitations of evaluation and management by telemedicine and the availability of in person appointments. The patient expressed understanding and agreed to proceed.  History of Present Illness: Pt is home c/o drainage ---her symptoms started 1/19 and she tested positive for covid on 1/24  She had a video visit on 1/28 and was taking mucinex     She stopped everything and symptoms are coming back  + productive cough but mucus is clear  No tightness in chest  Observations/Objective: There were no vitals filed for this visit. No fevers  Pt is in nad  No sob   Assessment and Plan: 1. Post-COVID chronic cough con't mucinex    2. Viral upper respiratory tract infection No nasal congestion  Can use flonase Take xyzal for pnd  Call if symptoms worsen  - levocetirizine (XYZAL) 5 MG tablet; Take 1 tablet (5 mg total) by mouth every evening.  Dispense: 30 tablet; Refill: 5   Follow Up Instructions:    I discussed the assessment and treatment plan with the patient. The patient was provided an opportunity to ask questions and all were answered. The patient agreed with the plan and demonstrated an understanding of the instructions.   The patient was advised to call back or seek an in-person evaluation if the symptoms worsen or if the condition fails to improve as anticipated.  Ann Held, DO

## 2020-05-08 ENCOUNTER — Encounter: Payer: Self-pay | Admitting: Hematology & Oncology

## 2020-05-08 ENCOUNTER — Telehealth: Payer: Self-pay

## 2020-05-08 ENCOUNTER — Inpatient Hospital Stay (HOSPITAL_BASED_OUTPATIENT_CLINIC_OR_DEPARTMENT_OTHER): Payer: Medicare Other | Admitting: Hematology & Oncology

## 2020-05-08 ENCOUNTER — Inpatient Hospital Stay: Payer: Medicare Other | Attending: Hematology & Oncology

## 2020-05-08 ENCOUNTER — Other Ambulatory Visit: Payer: Self-pay

## 2020-05-08 VITALS — BP 129/69 | HR 59 | Temp 98.1°F | Resp 20 | Wt 236.8 lb

## 2020-05-08 DIAGNOSIS — R059 Cough, unspecified: Secondary | ICD-10-CM | POA: Insufficient documentation

## 2020-05-08 DIAGNOSIS — D471 Chronic myeloproliferative disease: Secondary | ICD-10-CM | POA: Insufficient documentation

## 2020-05-08 DIAGNOSIS — Z8616 Personal history of COVID-19: Secondary | ICD-10-CM | POA: Insufficient documentation

## 2020-05-08 DIAGNOSIS — E039 Hypothyroidism, unspecified: Secondary | ICD-10-CM | POA: Diagnosis not present

## 2020-05-08 DIAGNOSIS — Z79899 Other long term (current) drug therapy: Secondary | ICD-10-CM | POA: Insufficient documentation

## 2020-05-08 LAB — CMP (CANCER CENTER ONLY)
ALT: 9 U/L (ref 0–44)
AST: 14 U/L — ABNORMAL LOW (ref 15–41)
Albumin: 4.5 g/dL (ref 3.5–5.0)
Alkaline Phosphatase: 126 U/L (ref 38–126)
Anion gap: 6 (ref 5–15)
BUN: 12 mg/dL (ref 6–20)
CO2: 31 mmol/L (ref 22–32)
Calcium: 11.1 mg/dL — ABNORMAL HIGH (ref 8.9–10.3)
Chloride: 101 mmol/L (ref 98–111)
Creatinine: 0.58 mg/dL (ref 0.44–1.00)
GFR, Estimated: >60 mL/min (ref >60)
Glucose, Bld: 105 mg/dL — ABNORMAL HIGH (ref 70–99)
Potassium: 4.4 mmol/L (ref 3.5–5.1)
Sodium: 138 mmol/L (ref 135–145)
Total Bilirubin: 0.5 mg/dL (ref 0.3–1.2)
Total Protein: 7.6 g/dL (ref 6.5–8.1)

## 2020-05-08 LAB — CBC WITH DIFFERENTIAL (CANCER CENTER ONLY)
Abs Immature Granulocytes: 0.38 10*3/uL — ABNORMAL HIGH (ref 0.00–0.07)
Basophils Absolute: 0.1 10*3/uL (ref 0.0–0.1)
Basophils Relative: 0 %
Eosinophils Absolute: 0.1 10*3/uL (ref 0.0–0.5)
Eosinophils Relative: 1 %
HCT: 42.5 % (ref 36.0–46.0)
Hemoglobin: 13.8 g/dL (ref 12.0–15.0)
Immature Granulocytes: 2 %
Lymphocytes Relative: 13 %
Lymphs Abs: 2.8 10*3/uL (ref 0.7–4.0)
MCH: 30.1 pg (ref 26.0–34.0)
MCHC: 32.5 g/dL (ref 30.0–36.0)
MCV: 92.8 fL (ref 80.0–100.0)
Monocytes Absolute: 1.1 10*3/uL — ABNORMAL HIGH (ref 0.1–1.0)
Monocytes Relative: 5 %
Neutro Abs: 16.7 10*3/uL — ABNORMAL HIGH (ref 1.7–7.7)
Neutrophils Relative %: 79 %
Platelet Count: 304 10*3/uL (ref 150–400)
RBC: 4.58 MIL/uL (ref 3.87–5.11)
RDW: 12.8 % (ref 11.5–15.5)
WBC Count: 21.2 10*3/uL — ABNORMAL HIGH (ref 4.0–10.5)
nRBC: 0 % (ref 0.0–0.2)

## 2020-05-08 LAB — LACTATE DEHYDROGENASE: LDH: 131 U/L (ref 98–192)

## 2020-05-08 LAB — SAVE SMEAR(SSMR), FOR PROVIDER SLIDE REVIEW

## 2020-05-08 NOTE — Telephone Encounter (Signed)
appts made per 05/08/20 los and pt req not to print   Brianna Fletcher

## 2020-05-08 NOTE — Progress Notes (Signed)
Hematology and Oncology Follow Up Visit  Brianna Fletcher [id number] Jan 22, 1971 50 y.o. 05/08/2020   Principle Diagnosis:  Chronic myeloproliferative syndrome-Triple negative Sub-clinical hypothyroidism (+) p-ANCA  Current Therapy:   Peg-Interferon q weekly - q 4 week dosing - On hold due to cost Synthroid 0.050 mg po q day   Interim History: Brianna Fletcher is here today for follow-up.  She did have Covid back in January.  She is unsure how she got this.  She was not hospitalized.  She has congestion.  She has a cough.  She does have some chills at nighttime.  She thinks all this is improving.  She still spends most of time try to help her mother.  Her mother has worsening dementia.  There is still some problems up in [place].  She cannot make it up to [place] because of her mother's health problems.  She has had no issues with rashes.  There is been no leg swelling.  She has had no diarrhea.  She has had no bleeding.  She did have an ultrasound of the abdomen.  This because of a abdominal mass that was in the left upper quadrant.  Thankfully, the ultrasound showed that the mass represented a lipoma.  It measured 5.1 x 2.4 x 4.6 cm.    Currently, her performance status is ECOG 1.    Medications:  Allergies as of 05/08/2020      Reactions   Lisinopril Cough   Losartan Cough   Lubiprostone Nausea And Vomiting      Medication List       Accurate as of May 08, 2020  8:14 AM. If you have any questions, ask your nurse or doctor.        amLODipine 10 MG tablet Commonly known as: NORVASC Take 1 tablet (10 mg total) by mouth daily.   aspirin 81 MG tablet Take 81 mg by mouth daily.   chlorhexidine 0.12 % solution Commonly known as: PERIDEX SMARTSIG:By Mouth   Fish Oil 1200 MG Caps Take 1 capsule by mouth daily.   furosemide 20 MG tablet Commonly known as: LASIX Take 1 tablet (20 mg total) by mouth daily.   levocetirizine 5 MG tablet Commonly known as:  Xyzal Take 1 tablet (5 mg total) by mouth every evening.   levothyroxine 50 MCG tablet Commonly known as: SYNTHROID Take 1 tablet (50 mcg total) by mouth daily before breakfast. Needs ov   Magnesium 500 MG Caps Take by mouth daily.   melatonin 5 MG Tabs Take 1 tablet by mouth daily.   omeprazole 20 MG capsule Commonly known as: PRILOSEC Take 1 capsule (20 mg total) by mouth daily.   vitamin C 1000 MG tablet Take by mouth daily.   VITAMIN D3 PO Take by mouth daily.   zinc gluconate 50 MG tablet Take 50 mg by mouth daily.       Allergies:  Allergies  Allergen Reactions  . Lisinopril Cough  . Losartan Cough  . Lubiprostone Nausea And Vomiting    Past Medical History, Surgical history, Social history, and Family History were reviewed and updated.  Review of Systems: Review of Systems  Constitutional: Positive for malaise/fatigue.  HENT: Negative.   Eyes: Negative.   Respiratory: Negative.   Cardiovascular: Negative.   Gastrointestinal: Positive for diarrhea.  Genitourinary: Negative.   Musculoskeletal: Positive for myalgias.  Skin: Negative.   Neurological: Positive for dizziness.  Psychiatric/Behavioral: Negative.      Physical Exam:  vitals were not taken  for this visit.   Wt Readings from Last 3 Encounters:  03/27/20 235 lb (106.6 kg)  01/31/20 235 lb 3.2 oz (106.7 kg)  01/21/20 233 lb (105.7 kg)    Physical Exam Vitals reviewed.  HENT:     Head: Normocephalic and atraumatic.  Eyes:     Pupils: Pupils are equal, round, and reactive to light.  Cardiovascular:     Rate and Rhythm: Normal rate and regular rhythm.     Heart sounds: Normal heart sounds.  Pulmonary:     Effort: Pulmonary effort is normal.     Breath sounds: Normal breath sounds.  Abdominal:     General: Bowel sounds are normal.     Palpations: Abdomen is soft.  Musculoskeletal:        General: No tenderness or deformity. Normal range of motion.     Cervical back: Normal range  of motion.  Lymphadenopathy:     Cervical: No cervical adenopathy.  Skin:    General: Skin is warm and dry.     Findings: No erythema or rash.  Neurological:     Mental Status: She is alert and oriented to person, place, and time.  Psychiatric:        Behavior: Behavior normal.        Thought Content: Thought content normal.        Judgment: Judgment normal.    Lab Results  Component Value Date   WBC 17.1 (H) 01/21/2020   HGB 14.2 01/21/2020   HCT 44.4 01/21/2020   MCV 93.9 01/21/2020   PLT 291 01/21/2020   Lab Results  Component Value Date   FERRITIN 64 06/06/2018   IRON 82 06/06/2018   TIBC 338 06/06/2018   UIBC 256 06/06/2018   IRONPCTSAT 24 06/06/2018   Lab Results  Component Value Date   RETICCTPCT 2.1 04/12/2017   RBC 4.73 01/21/2020   RETICCTABS 116.1 11/20/2013   No results found for: KPAFRELGTCHN, LAMBDASER, KAPLAMBRATIO No results found for: IGGSERUM, IGA, IGMSERUM No results found for: Kathrynn Ducking, MSPIKE, SPEI   Chemistry      Component Value Date/Time   NA 139 01/21/2020 0817   NA 140 02/08/2017 0925   NA 136 03/10/2016 1147   K 3.7 01/21/2020 0817   K 3.4 02/08/2017 0925   K 3.9 03/10/2016 1147   CL 103 01/21/2020 0817   CL 105 02/08/2017 0925   CO2 27 01/21/2020 0817   CO2 26 02/08/2017 0925   CO2 24 03/10/2016 1147   BUN 12 01/21/2020 0817   BUN 10 02/08/2017 0925   BUN 10.2 03/10/2016 1147   CREATININE 0.64 01/21/2020 0817   CREATININE 0.7 02/08/2017 0925   CREATININE 0.7 03/10/2016 1147      Component Value Date/Time   CALCIUM 11.3 (H) 01/21/2020 0817   CALCIUM 10.4 (H) 02/08/2017 0925   CALCIUM 10.7 (H) 03/10/2016 1147   ALKPHOS 132 (H) 01/21/2020 0817   ALKPHOS 106 (H) 02/08/2017 0925   ALKPHOS 132 03/10/2016 1147   AST 18 01/21/2020 0817   AST 12 03/10/2016 1147   ALT 10 01/21/2020 0817   ALT 16 02/08/2017 0925   ALT 8 03/10/2016 1147   BILITOT 0.4 01/21/2020 0817   BILITOT  0.63 03/10/2016 1147      Impression and Plan: Brianna Fletcher is a very pleasant 50 yo caucasian female with a triple negative myeloproliferative syndrome though all work up has been negative so far.   I noted that her  white cell count is up a little bit.  Again this might be from Covid.  I know she has not been on interferon for quite a while.  I really would hate to have to start this.  This is quite expensive for her.  We will plan to get her back to see [place] in a couple months.  We are going to have to follow this a little more closely now.  We will continue to pray hard for her family.  I know that Ms. Lorah puts everything upon herself, which is her nature.  She is a good woman.    [name], MD 2/17/20228:14 AM

## 2020-05-25 ENCOUNTER — Other Ambulatory Visit: Payer: Self-pay | Admitting: Family Medicine

## 2020-05-25 DIAGNOSIS — E039 Hypothyroidism, unspecified: Secondary | ICD-10-CM

## 2020-05-25 DIAGNOSIS — D471 Chronic myeloproliferative disease: Secondary | ICD-10-CM

## 2020-05-25 DIAGNOSIS — E041 Nontoxic single thyroid nodule: Secondary | ICD-10-CM

## 2020-05-25 DIAGNOSIS — R600 Localized edema: Secondary | ICD-10-CM

## 2020-07-03 ENCOUNTER — Other Ambulatory Visit: Payer: Self-pay

## 2020-07-03 ENCOUNTER — Encounter: Payer: Self-pay | Admitting: Family Medicine

## 2020-07-03 ENCOUNTER — Telehealth: Payer: Self-pay | Admitting: *Deleted

## 2020-07-03 ENCOUNTER — Inpatient Hospital Stay (HOSPITAL_BASED_OUTPATIENT_CLINIC_OR_DEPARTMENT_OTHER): Payer: Medicare Other | Admitting: Hematology & Oncology

## 2020-07-03 ENCOUNTER — Ambulatory Visit (INDEPENDENT_AMBULATORY_CARE_PROVIDER_SITE_OTHER): Payer: Medicare Other | Admitting: Family Medicine

## 2020-07-03 ENCOUNTER — Inpatient Hospital Stay: Payer: Medicare Other | Attending: Hematology & Oncology

## 2020-07-03 ENCOUNTER — Encounter: Payer: Self-pay | Admitting: Hematology & Oncology

## 2020-07-03 VITALS — BP 123/70 | HR 61 | Temp 97.6°F | Resp 18 | Ht 66.0 in | Wt 234.4 lb

## 2020-07-03 VITALS — BP 128/69 | HR 63 | Temp 98.0°F | Resp 18 | Wt 234.0 lb

## 2020-07-03 DIAGNOSIS — F0390 Unspecified dementia without behavioral disturbance: Secondary | ICD-10-CM | POA: Insufficient documentation

## 2020-07-03 DIAGNOSIS — R002 Palpitations: Secondary | ICD-10-CM | POA: Insufficient documentation

## 2020-07-03 DIAGNOSIS — E039 Hypothyroidism, unspecified: Secondary | ICD-10-CM

## 2020-07-03 DIAGNOSIS — R079 Chest pain, unspecified: Secondary | ICD-10-CM | POA: Diagnosis not present

## 2020-07-03 DIAGNOSIS — D471 Chronic myeloproliferative disease: Secondary | ICD-10-CM

## 2020-07-03 DIAGNOSIS — I10 Essential (primary) hypertension: Secondary | ICD-10-CM

## 2020-07-03 DIAGNOSIS — E785 Hyperlipidemia, unspecified: Secondary | ICD-10-CM | POA: Diagnosis not present

## 2020-07-03 LAB — CMP (CANCER CENTER ONLY)
ALT: 6 U/L (ref 0–44)
AST: 11 U/L — ABNORMAL LOW (ref 15–41)
Albumin: 4.6 g/dL (ref 3.5–5.0)
Alkaline Phosphatase: 114 U/L (ref 38–126)
Anion gap: 9 (ref 5–15)
BUN: 16 mg/dL (ref 6–20)
CO2: 27 mmol/L (ref 22–32)
Calcium: 11.3 mg/dL — ABNORMAL HIGH (ref 8.9–10.3)
Chloride: 104 mmol/L (ref 98–111)
Creatinine: 0.67 mg/dL (ref 0.44–1.00)
GFR, Estimated: >60 mL/min
Glucose, Bld: 98 mg/dL (ref 70–99)
Potassium: 3.7 mmol/L (ref 3.5–5.1)
Sodium: 140 mmol/L (ref 135–145)
Total Bilirubin: 0.4 mg/dL (ref 0.3–1.2)
Total Protein: 7.6 g/dL (ref 6.5–8.1)

## 2020-07-03 LAB — CBC WITH DIFFERENTIAL (CANCER CENTER ONLY)
Abs Immature Granulocytes: 0.21 10*3/uL — ABNORMAL HIGH (ref 0.00–0.07)
Basophils Absolute: 0.1 10*3/uL (ref 0.0–0.1)
Basophils Relative: 0 %
Eosinophils Absolute: 0.1 10*3/uL (ref 0.0–0.5)
Eosinophils Relative: 1 %
HCT: 43.3 % (ref 36.0–46.0)
Hemoglobin: 14.1 g/dL (ref 12.0–15.0)
Immature Granulocytes: 1 %
Lymphocytes Relative: 18 %
Lymphs Abs: 3 10*3/uL (ref 0.7–4.0)
MCH: 29.8 pg (ref 26.0–34.0)
MCHC: 32.6 g/dL (ref 30.0–36.0)
MCV: 91.5 fL (ref 80.0–100.0)
Monocytes Absolute: 1 10*3/uL (ref 0.1–1.0)
Monocytes Relative: 6 %
Neutro Abs: 12.7 10*3/uL — ABNORMAL HIGH (ref 1.7–7.7)
Neutrophils Relative %: 74 %
Platelet Count: 289 10*3/uL (ref 150–400)
RBC: 4.73 MIL/uL (ref 3.87–5.11)
RDW: 12.5 % (ref 11.5–15.5)
WBC Count: 17.1 10*3/uL — ABNORMAL HIGH (ref 4.0–10.5)
nRBC: 0 % (ref 0.0–0.2)

## 2020-07-03 LAB — LIPID PANEL
Cholesterol: 199 mg/dL (ref 0–200)
HDL: 48.1 mg/dL (ref >39.00)
LDL Cholesterol: 117 mg/dL — ABNORMAL HIGH (ref 0–99)
NonHDL: 150.87
Total CHOL/HDL Ratio: 4
Triglycerides: 167 mg/dL — ABNORMAL HIGH (ref 0.0–149.0)
VLDL: 33.4 mg/dL (ref 0.0–40.0)

## 2020-07-03 LAB — TSH: TSH: 0.54 u[IU]/mL (ref 0.35–4.50)

## 2020-07-03 LAB — SAVE SMEAR(SSMR), FOR PROVIDER SLIDE REVIEW

## 2020-07-03 LAB — LACTATE DEHYDROGENASE: LDH: 137 U/L (ref 98–192)

## 2020-07-03 NOTE — Assessment & Plan Note (Signed)
Check labs today.

## 2020-07-03 NOTE — Progress Notes (Signed)
Hematology and Oncology Follow Up Visit  Brianna Fletcher [id number] [birth year] 50 y.o. 07/03/2020   Principle Diagnosis:  Chronic myeloproliferative syndrome-Triple negative Sub-clinical hypothyroidism (+) p-ANCA  Current Therapy:   Peg-Interferon q weekly - q 4 week dosing - On hold due to cost Synthroid 0.050 mg po q day   Interim History: Brianna Fletcher is here today for follow-up.  She is under quite a bit of stress right now.  She is doing her best to help take care of her mom.  Her father also was having his problems.  They both have an element of dementia.  Her mother GIST is in worse shape.  She sees her mother going downhill constantly.  She is only 1 who can take care of her parents.  She is an only child.  Patient did have some chest pains.  Is been having some palpitations.  She is going to see her family doctor today.  She has had no issues with fever.  She has had the COVID in the past.  She has had no problems with nausea or vomiting.  She is not lost any weight.  There is no change in bowel or bladder habits.  She has had no rashes.  There has been no leg swelling.  Overall, I would say performance status is ECOG 1.    Medications:  Allergies as of 07/03/2020      Reactions   Lisinopril Cough   Losartan Cough   Lubiprostone Nausea And Vomiting      Medication List       Accurate as of July 03, 2020  9:10 AM. If you have any questions, ask your nurse or doctor.        amLODipine 10 MG tablet Commonly known as: NORVASC Take 1 tablet (10 mg total) by mouth daily.   aspirin 81 MG tablet Take 81 mg by mouth daily.   chlorhexidine 0.12 % solution Commonly known as: PERIDEX SMARTSIG:By Mouth   Fish Oil 1200 MG Caps Take 1 capsule by mouth daily.   furosemide 20 MG tablet Commonly known as: LASIX Take 1 tablet (20 mg total) by mouth daily.   levocetirizine 5 MG tablet Commonly known as: Xyzal Take 1 tablet (5 mg total) by mouth every evening.    levothyroxine 50 MCG tablet Commonly known as: SYNTHROID Take 1 tablet (50 mcg total) by mouth daily before breakfast. Needs ov   Magnesium 500 MG Caps Take by mouth daily.   melatonin 5 MG Tabs Take 1 tablet by mouth daily.   omeprazole 20 MG capsule Commonly known as: PRILOSEC Take 1 capsule (20 mg total) by mouth daily.   vitamin C 1000 MG tablet Take by mouth daily.   VITAMIN D3 PO Take by mouth daily.   zinc gluconate 50 MG tablet Take 50 mg by mouth daily.       Allergies:  Allergies  Allergen Reactions  . Lisinopril Cough  . Losartan Cough  . Lubiprostone Nausea And Vomiting    Past Medical History, Surgical history, Social history, and Family History were reviewed and updated.  Review of Systems: Review of Systems  Constitutional: Positive for malaise/fatigue.  HENT: Negative.   Eyes: Negative.   Respiratory: Negative.   Cardiovascular: Negative.   Gastrointestinal: Positive for diarrhea.  Genitourinary: Negative.   Musculoskeletal: Positive for myalgias.  Skin: Negative.   Neurological: Positive for dizziness.  Psychiatric/Behavioral: Negative.      Physical Exam:  weight is 234 lb (106.1 kg). Her oral  temperature is 98 F (36.7 C). Her blood pressure is 128/69 and her pulse is 63. Her respiration is 18 and oxygen saturation is 100%.   Wt Readings from Last 3 Encounters:  07/03/20 234 lb (106.1 kg)  05/08/20 236 lb 12.8 oz (107.4 kg)  03/27/20 235 lb (106.6 kg)    Physical Exam Vitals reviewed.  HENT:     Head: Normocephalic and atraumatic.  Eyes:     Pupils: Pupils are equal, round, and reactive to light.  Cardiovascular:     Rate and Rhythm: Normal rate and regular rhythm.     Heart sounds: Normal heart sounds.  Pulmonary:     Effort: Pulmonary effort is normal.     Breath sounds: Normal breath sounds.  Abdominal:     General: Bowel sounds are normal.     Palpations: Abdomen is soft.  Musculoskeletal:        General: No  tenderness or deformity. Normal range of motion.     Cervical back: Normal range of motion.  Lymphadenopathy:     Cervical: No cervical adenopathy.  Skin:    General: Skin is warm and dry.     Findings: No erythema or rash.  Neurological:     Mental Status: She is alert and oriented to person, place, and time.  Psychiatric:        Behavior: Behavior normal.        Thought Content: Thought content normal.        Judgment: Judgment normal.    Lab Results  Component Value Date   WBC 17.1 (H) 07/03/2020   HGB 14.1 07/03/2020   HCT 43.3 07/03/2020   MCV 91.5 07/03/2020   PLT 289 07/03/2020   Lab Results  Component Value Date   FERRITIN 64 06/06/2018   IRON 82 06/06/2018   TIBC 338 06/06/2018   UIBC 256 06/06/2018   IRONPCTSAT 24 06/06/2018   Lab Results  Component Value Date   RETICCTPCT 2.1 04/12/2017   RBC 4.73 07/03/2020   RETICCTABS 116.1 11/20/2013   No results found for: KPAFRELGTCHN, LAMBDASER, KAPLAMBRATIO No results found for: IGGSERUM, IGA, IGMSERUM No results found for: Kathrynn Ducking, MSPIKE, SPEI   Chemistry      Component Value Date/Time   NA 140 07/03/2020 0810   NA 140 02/08/2017 0925   NA 136 03/10/2016 1147   K 3.7 07/03/2020 0810   K 3.4 02/08/2017 0925   K 3.9 03/10/2016 1147   CL 104 07/03/2020 0810   CL 105 02/08/2017 0925   CO2 27 07/03/2020 0810   CO2 26 02/08/2017 0925   CO2 24 03/10/2016 1147   BUN 16 07/03/2020 0810   BUN 10 02/08/2017 0925   BUN 10.2 03/10/2016 1147   CREATININE 0.67 07/03/2020 0810   CREATININE 0.7 02/08/2017 0925   CREATININE 0.7 03/10/2016 1147      Component Value Date/Time   CALCIUM 11.3 (H) 07/03/2020 0810   CALCIUM 10.4 (H) 02/08/2017 0925   CALCIUM 10.7 (H) 03/10/2016 1147   ALKPHOS 114 07/03/2020 0810   ALKPHOS 106 (H) 02/08/2017 0925   ALKPHOS 132 03/10/2016 1147   AST 11 (L) 07/03/2020 0810   AST 12 03/10/2016 1147   ALT 6 07/03/2020 0810   ALT 16  02/08/2017 0925   ALT 8 03/10/2016 1147   BILITOT 0.4 07/03/2020 0810   BILITOT 0.63 03/10/2016 1147      Impression and Plan: Brianna Fletcher is a very pleasant 50 yo caucasian  female with a triple negative myeloproliferative syndrome though all work up has been negative so far.   I noted that her white cell count is back down.  It would not surprise me if she has fluctuations of her white cell count.  Thankfully, her red cells and platelets are all holding steady.  I just feel bad that she is trying to help her parents out and is doing 1 to do this.  I think we can get her back in 3 months.  She will see her family doctor after she sees [place].  Her lab work looks good.  I cannot imagine that she has anything going on cardiac wise.    [name], MD 4/14/20229:10 AM

## 2020-07-03 NOTE — Progress Notes (Signed)
Subjective:   By signing my name below, I, Shehryar Baig, attest that this documentation has been prepared under the direction and in the presence of Dr. Roma Schanz R. 07/03/2020     Patient ID: Brianna Fletcher, female    DOB: January 25, 1971, 50 y.o.   MRN: [id number]  Chief Complaint  Patient presents with  . Hyperlipidemia    Pt,want to discuss her heart and cholesterol her well being. Pt says the last 2-3 weeks have been rough d/t both of her parents health. She has been having some chest pains and some pains in her legs. -tlc,rma    HPI Patient is in today for a office visit. She reports she is having elevated cholesterol levels and request for further evaluation. She takes fish oil to treat her high cholesterol but has not seen much improvement. She is currently interested in taking flax seed oil to also add to her treatment. She states that she has been monitoring her diet.   She notes it is difficult to attend her appointments due to her family situation. Her mother has dementia and Alzheimer's disease. She mentions being the only child and it is difficult to take care of her mother and step father at the same time. She has now developed stress from this situation.  She is concerned that she may her mother's conditions is genetic because her mother siblings have also been diagnosised with the same disease. She request if there is a test identify if she has a similar marker.    For last couple of weeks it has also felt fatigue more than her baseline. She has spontaneous back and chest pain that has gradually became evident over the past couple of weeks. She denies any shortness of breath upon exertion, LE edema, fever, chills, nausea, vomiting, hematuria, abdominal pain, diarrhea, or headaches at this time.   Past Medical History:  Diagnosis Date  . Arthritis   . Back pain   . Blood dyscrasia    produces too many WBCs  . Chronic neutrophilia 11/15/2012  . Depression   . Edema,  lower extremity   . Family history of heart disease    mother had stents placed in her 23s and had myocardial infarctions  . Fibromyalgia   . GERD (gastroesophageal reflux disease)   . Glaucoma   . Glaucoma   . High cholesterol   . Hypertension   . IBS (irritable bowel syndrome)   . Joint pain   . Leukocytosis, unspecified 11/15/2012  . Monocytosis 11/15/2012  . Myeloproliferative disorder ([place]) 03/08/2013  . Precordial chest pain   . Pruritus 11/15/2012  . Radial artery occlusion, right (HCC)    status post  right radial artery cardiac catheterization  . Thyroid disease   . Vaginal delivery 1986, 2000  . Vitamin D deficiency     Past Surgical History:  Procedure Laterality Date  . CARDIAC CATHETERIZATION N/A 10/03/2014   Procedure: Left Heart Cath and Coronary Angiography;  Surgeon: Lorretta Harp, MD;  Location: [facility];  Service: Cardiovascular;  Laterality: N/A;  . DILITATION & CURRETTAGE/HYSTROSCOPY WITH NOVASURE ABLATION N/A 11/08/2014   Procedure: DILATATION & CURETTAGE/HYSTEROSCOPY WITH NOVASURE ABLATION;  Surgeon: Linda Hedges, DO;  Location: [facility];  Service: Gynecology;  Laterality: N/A;  . EYE SURGERY     laser eye surgery  . LAPAROSCOPIC VAGINAL HYSTERECTOMY WITH SALPINGECTOMY Bilateral 03/05/2015   Procedure: LAPAROSCOPIC ASSISTED VAGINAL HYSTERECTOMY WITH SALPINGECTOMY;  Surgeon: Linda Hedges, DO;  Location: [facility];  Service: Gynecology;  Laterality: Bilateral;  . TUBAL LIGATION  2000    Family History  Problem Relation Age of Onset  . Heart disease Mother   . Hypertension Mother   . Hyperlipidemia Mother   . Depression Mother   . Sleep apnea Mother   . Obesity Mother   . Diabetes Father   . Cancer Father        esopageal ?  . Alcoholism Father   . Dementia Maternal Grandmother   . Heart disease Maternal Grandmother   . Heart disease Maternal Grandfather 107       MI  . Heart disease Cousin   . Other Neg Hx        hyperparathyroidism     Social History   Socioeconomic History  . Marital status: Divorced    Spouse name: Not on file  . Number of children: Not on file  . Years of education: Not on file  . Highest education level: Not on file  Occupational History  . Occupation: unemployed  Tobacco Use  . Smoking status: Never Smoker  . Smokeless tobacco: Never Used  . Tobacco comment: patient reports light tobacco use, has not smoked since friday (09/27/14)  Vaping Use  . Vaping Use: Never used  Substance and Sexual Activity  . Alcohol use: Yes    Alcohol/week: 6.0 standard drinks    Types: 6 Cans of beer per week  . Drug use: No  . Sexual activity: Not Currently    Partners: Male  Other Topics Concern  . Not on file  Social History Narrative   Very little exercise   Social Determinants of Health   Financial Resource Strain: Not on file  Food Insecurity: Not on file  Transportation Needs: Not on file  Physical Activity: Not on file  Stress: Not on file  Social Connections: Not on file  Intimate Partner Violence: Not on file    Outpatient Medications Prior to Visit  Medication Sig Dispense Refill  . amLODipine (NORVASC) 10 MG tablet Take 1 tablet (10 mg total) by mouth daily. 90 tablet 1  . Ascorbic Acid (VITAMIN C) 1000 MG tablet Take by mouth daily.    Marland Kitchen aspirin 81 MG tablet Take 81 mg by mouth daily.    . chlorhexidine (PERIDEX) 0.12 % solution SMARTSIG:By Mouth    . Cholecalciferol (VITAMIN D3 PO) Take by mouth daily.     . furosemide (LASIX) 20 MG tablet Take 1 tablet (20 mg total) by mouth daily. 90 tablet 1  . levocetirizine (XYZAL) 5 MG tablet Take 1 tablet (5 mg total) by mouth every evening. 30 tablet 5  . levothyroxine (SYNTHROID) 50 MCG tablet Take 1 tablet (50 mcg total) by mouth daily before breakfast. Needs ov 90 tablet 1  . Magnesium 500 MG CAPS Take by mouth daily.     . Melatonin 5 MG TABS Take 1 tablet by mouth daily.    . Omega-3 Fatty Acids (FISH OIL) 1200 MG CAPS Take 1 capsule  by mouth daily.    Marland Kitchen omeprazole (PRILOSEC) 20 MG capsule Take 1 capsule (20 mg total) by mouth daily. 90 capsule 3  . zinc gluconate 50 MG tablet Take 50 mg by mouth daily.     Facility-Administered Medications Prior to Visit  Medication Dose Route Frequency Provider Last Rate Last Admin  . acetaminophen (TYLENOL) tablet 650 mg  650 mg Oral Once [name], MD      . peginterferon alfa-2a (PEGASYS) injection 81 mcg  81 mcg Subcutaneous Weekly [name], MD   81 mcg at 04/26/14 1530    Allergies  Allergen Reactions  . Lisinopril Cough  . Losartan Cough  . Lubiprostone Nausea And Vomiting    Review of Systems  Constitutional: Positive for malaise/fatigue. Negative for chills and fever.  HENT: Negative for congestion, ear pain, sinus pain and sore throat.   Eyes: Negative for pain.  Respiratory: Negative for cough, shortness of breath and wheezing.   Cardiovascular: Positive for chest pain. Negative for palpitations.  Gastrointestinal: Negative for abdominal pain, blood in stool, diarrhea, nausea and vomiting.  Genitourinary: Negative for dysuria, frequency and urgency.  Musculoskeletal: Positive for back pain. Negative for myalgias.  Skin: Negative for rash.  Neurological: Negative for dizziness and headaches.  Psychiatric/Behavioral: Positive for depression (situational). The patient is nervous/anxious.        Objective:    Physical Exam Constitutional:      General: She is not in acute distress.    Appearance: Normal appearance. She is not ill-appearing.  HENT:     Head: Normocephalic and atraumatic.     Right Ear: External ear normal.     Left Ear: External ear normal.  Eyes:     Extraocular Movements: Extraocular movements intact.     Pupils: Pupils are equal, round, and reactive to light.  Cardiovascular:     Rate and Rhythm: Normal rate and regular rhythm.     Pulses: Normal pulses.     Heart sounds: Normal heart sounds. No murmur  heard.   Pulmonary:     Effort: Pulmonary effort is normal. No respiratory distress.     Breath sounds: Normal breath sounds. No wheezing, rhonchi or rales.  Skin:    General: Skin is warm and dry.  Neurological:     Mental Status: She is alert and oriented to person, place, and time.  Psychiatric:        Mood and Affect: Mood is anxious. Affect is tearful.        Behavior: Behavior normal.     BP 123/70 (BP Location: Left Arm, Patient Position: Sitting, Cuff Size: Large)   Pulse 61   Temp 97.6 F (36.4 C) (Oral)   Resp 18   Ht _0  (1.676 m)   Wt 234 lb 6.4 oz (106.3 kg)   LMP  (LMP Unknown)   SpO2 99%   BMI 37.83 kg/m  Wt Readings from Last 3 Encounters:  07/03/20 234 lb 6.4 oz (106.3 kg)  07/03/20 234 lb (106.1 kg)  05/08/20 236 lb 12.8 oz (107.4 kg)    Diabetic Foot Exam - Simple   No data filed    Lab Results  Component Value Date   WBC 17.1 (H) 07/03/2020   HGB 14.1 07/03/2020   HCT 43.3 07/03/2020   PLT 289 07/03/2020   GLUCOSE 98 07/03/2020   CHOL 230 (H) 01/31/2020   TRIG 201.0 (H) 01/31/2020   HDL 53.70 01/31/2020   LDLDIRECT 167.0 01/31/2020   LDLCALC 126 (H) 12/08/2018   ALT 6 07/03/2020   AST 11 (L) 07/03/2020   NA 140 07/03/2020   K 3.7 07/03/2020   CL 104 07/03/2020   CREATININE 0.67 07/03/2020   BUN 16 07/03/2020   CO2 27 07/03/2020   TSH 0.65 01/31/2020   INR 0.9 05/06/2016   MICROALBUR <0.7 04/23/2016    Lab Results  Component Value Date   TSH 0.65 01/31/2020   Lab Results  Component Value Date   WBC 17.1 (  H) 07/03/2020   HGB 14.1 07/03/2020   HCT 43.3 07/03/2020   MCV 91.5 07/03/2020   PLT 289 07/03/2020   Lab Results  Component Value Date   NA 140 07/03/2020   K 3.7 07/03/2020   CHLORIDE 102 03/10/2016   CO2 27 07/03/2020   GLUCOSE 98 07/03/2020   BUN 16 07/03/2020   CREATININE 0.67 07/03/2020   BILITOT 0.4 07/03/2020   ALKPHOS 114 07/03/2020   AST 11 (L) 07/03/2020   ALT 6 07/03/2020   PROT 7.6 07/03/2020    ALBUMIN 4.6 07/03/2020   CALCIUM 11.3 (H) 07/03/2020   ANIONGAP 9 07/03/2020   EGFR >90 03/10/2016   GFR 103.53 12/08/2018   Lab Results  Component Value Date   CHOL 230 (H) 01/31/2020   Lab Results  Component Value Date   HDL 53.70 01/31/2020   Lab Results  Component Value Date   LDLCALC 126 (H) 12/08/2018   Lab Results  Component Value Date   TRIG 201.0 (H) 01/31/2020   Lab Results  Component Value Date   CHOLHDL 4 01/31/2020   No results found for: HGBA1C     Assessment & Plan:   Problem List Items Addressed This Visit      Unprioritized   Chest pain    ekg -- sinus brady---- no change from 05/2019      Relevant Orders   EKG 12-Lead (Completed)   Ambulatory referral to Cardiology   Essential hypertension    Well controlled, no changes to meds. Encouraged heart healthy diet such as the DASH diet and exercise as tolerated.       Hyperlipidemia - Primary    Encouraged heart healthy diet, increase exercise, avoid trans fats, consider a krill oil cap daily      Relevant Orders   Lipid panel   Amb ref to Medical Nutrition Therapy-MNT   Hypothyroidism    Check labs today      Relevant Orders   TSH   Morbid obesity ([place])    Pt is being referred to nutritionist       Relevant Orders   Amb ref to Medical Nutrition Therapy-MNT       No orders of the defined types were placed in this encounter.   IAnn Held, DO, personally preformed the services described in this documentation.  All medical record entries made by the scribe were at my direction and in my presence.  I have reviewed the chart and discharge instructions (if applicable) and agree that the record reflects my personal performance and is accurate and complete. 07/03/2020   I,Shehryar Baig,acting as a scribe for Ann Held, DO.,have documented all relevant documentation on the behalf of Ann Held, DO,as directed by  Ann Held, DO while in the  presence of [place], DO, have reviewed all documentation for this visit. The documentation on 07/03/20 for the exam, diagnosis, procedures, and orders are all accurate and complete.   Ann Held, DO

## 2020-07-03 NOTE — Assessment & Plan Note (Signed)
ekg -- sinus brady---- no change from 05/2019

## 2020-07-03 NOTE — Assessment & Plan Note (Signed)
Encouraged heart healthy diet, increase exercise, avoid trans fats, consider a krill oil cap daily 

## 2020-07-03 NOTE — Patient Instructions (Signed)

## 2020-07-03 NOTE — Telephone Encounter (Signed)
Per los 07/03/20 called patient and gave upcoming appointments

## 2020-07-03 NOTE — Assessment & Plan Note (Signed)
Well controlled, no changes to meds. Encouraged heart healthy diet such as the DASH diet and exercise as tolerated.  °

## 2020-07-03 NOTE — Assessment & Plan Note (Signed)
Pt is being referred to nutritionist

## 2020-07-14 DIAGNOSIS — Z822 Family history of deafness and hearing loss: Secondary | ICD-10-CM | POA: Diagnosis not present

## 2020-07-14 DIAGNOSIS — H9190 Unspecified hearing loss, unspecified ear: Secondary | ICD-10-CM | POA: Diagnosis not present

## 2020-07-14 DIAGNOSIS — J342 Deviated nasal septum: Secondary | ICD-10-CM | POA: Diagnosis not present

## 2020-07-14 DIAGNOSIS — R0981 Nasal congestion: Secondary | ICD-10-CM | POA: Diagnosis not present

## 2020-07-21 DIAGNOSIS — D759 Disease of blood and blood-forming organs, unspecified: Secondary | ICD-10-CM | POA: Insufficient documentation

## 2020-07-21 DIAGNOSIS — E7800 Pure hypercholesterolemia, unspecified: Secondary | ICD-10-CM | POA: Insufficient documentation

## 2020-07-21 DIAGNOSIS — R600 Localized edema: Secondary | ICD-10-CM | POA: Insufficient documentation

## 2020-07-21 DIAGNOSIS — F32A Depression, unspecified: Secondary | ICD-10-CM | POA: Insufficient documentation

## 2020-07-21 DIAGNOSIS — M797 Fibromyalgia: Secondary | ICD-10-CM | POA: Insufficient documentation

## 2020-07-21 DIAGNOSIS — F418 Other specified anxiety disorders: Secondary | ICD-10-CM | POA: Insufficient documentation

## 2020-07-21 DIAGNOSIS — M1990 Unspecified osteoarthritis, unspecified site: Secondary | ICD-10-CM | POA: Insufficient documentation

## 2020-07-21 DIAGNOSIS — H409 Unspecified glaucoma: Secondary | ICD-10-CM | POA: Insufficient documentation

## 2020-07-21 DIAGNOSIS — K219 Gastro-esophageal reflux disease without esophagitis: Secondary | ICD-10-CM | POA: Insufficient documentation

## 2020-07-21 DIAGNOSIS — I10 Essential (primary) hypertension: Secondary | ICD-10-CM | POA: Insufficient documentation

## 2020-07-21 DIAGNOSIS — R072 Precordial pain: Secondary | ICD-10-CM | POA: Insufficient documentation

## 2020-07-21 DIAGNOSIS — M2550 Pain in unspecified joint: Secondary | ICD-10-CM | POA: Insufficient documentation

## 2020-07-21 DIAGNOSIS — Z8249 Family history of ischemic heart disease and other diseases of the circulatory system: Secondary | ICD-10-CM | POA: Insufficient documentation

## 2020-07-21 DIAGNOSIS — E079 Disorder of thyroid, unspecified: Secondary | ICD-10-CM | POA: Insufficient documentation

## 2020-07-21 DIAGNOSIS — M549 Dorsalgia, unspecified: Secondary | ICD-10-CM | POA: Insufficient documentation

## 2020-07-21 DIAGNOSIS — K589 Irritable bowel syndrome without diarrhea: Secondary | ICD-10-CM | POA: Insufficient documentation

## 2020-07-25 ENCOUNTER — Other Ambulatory Visit: Payer: Self-pay | Admitting: Family Medicine

## 2020-07-25 DIAGNOSIS — I10 Essential (primary) hypertension: Secondary | ICD-10-CM

## 2020-07-26 NOTE — Progress Notes (Signed)
Cardiology Office Note:    Date:  07/28/2020   ID:  Brianna Fletcher, DOB 1970/07/13, MRN [id number]  PCP:  Brianna Held, DO  Cardiologist:  Shirlee More, MD   Referring MD: Brianna Fletcher, Alferd Apa, *  ASSESSMENT:    1. Chest pain of uncertain etiology   2. Coronary artery calcification seen on CT scan   3. Essential hypertension   4. Mixed hyperlipidemia    PLAN:    In order of problems listed above:  1. Her symptoms are nonanginal in nature she is at increased cardiovascular risk with coronary artery calcification and dyslipidemia and she will undergo cardiac CTA for further evaluation. 2. Initiate lipid-lowering therapy statin Zetia combination 3. Stable continue current treatment calcium channel blocker 4. Initiate lipid-lowering treatment  Next appointment 6 weeks total   Medication Adjustments/Labs and Tests Ordered: Current medicines are reviewed at length with the patient today.  Concerns regarding medicines are outlined above.  No orders of the defined types were placed in this encounter.  No orders of the defined types were placed in this encounter.    Chief Complaint  Patient presents with  . Chest Pain    History of Present Illness:    Brianna Fletcher is a 50 y.o. female with a family history of premature CAD hypertension hyperlipidemia and myeloproliferative disorder who is being seen today for the evaluation of chest pain at the request of Brianna Fletcher, *.    She has a history of radial artery occlusion after radial artery access for coronary angiography.  EKG performed 07/03/2020 personally reviewed sinus bradycardia 56 bpm otherwise normal EKG  Chart review shows normal ABI and TBI performed 09/14/2016 and normal lower extremity venous duplex is performed April 2017.  Coronary angiography performed 10/03/2014 showed normal coronary arteriography and left ventricular function.  A myocardial perfusion study in 2016 showed equivocal  abnormal perfusion with inferior defect perfusion and low normal ejection fraction 53% and echocardiogram showed normal left ventricular size function wall thickness left atrium was described as moderately to severely enlarged however on volume index this was mild to moderate.  CTA of the chest 2014 showed coronary artery atherosclerosis that was described as advanced for age localized left anterior descending coronary artery the thoracic aorta was normal.  She was unaware if she has coronary artery calcification and generally is not on statins just because she is concerned about the potential of side effects. Her perception was that she did not have a severe lipid disorder and asked her to accept lipid-lowering therapy and put her on a low-dose of low intensity statin 2 days a week along with Zetia recheck lipids when she seen back in follow-up. She is under intense stress at attempting to care for her and keep her out of skilled nursing with dementia is very tearful in the office and at times she gets brief momentary chest discomfort without physical activity.  She is not having exertional angina shortness of breath palpitation or syncope. We discussed modalities for ischemia evaluation I be hesitant to do a myocardial perfusion study as it was abnormal in the past with normal coronary angiography and I do not think she really needs repeat coronary angiography at this time. Ideally cardiac CTA is a good choice here I explained to her that we are entering her oral white shortage of contrast dye to COVID-19 disruption of production in [place] told her that we will go ahead and schedule but may be pushed back  2 to 4 weeks and she agrees. She has had no severe symptoms since her EKG that was normal. Past Medical History:  Diagnosis Date  . Arthritis   . Back pain   . Blood dyscrasia    produces too many WBCs  . Chronic neutrophilia 11/15/2012  . Depression   . Edema, lower extremity   . Family history of  heart disease    mother had stents placed in her 18s and had myocardial infarctions  . Fibromyalgia   . GERD (gastroesophageal reflux disease)   . Glaucoma   . Glaucoma   . High cholesterol   . Hypertension   . IBS (irritable bowel syndrome)   . Joint pain   . Leukocytosis, unspecified 11/15/2012  . Monocytosis 11/15/2012  . Myeloproliferative disorder ([place]) 03/08/2013  . Precordial chest pain   . Pruritus 11/15/2012  . Radial artery occlusion, right (HCC)    status post  right radial artery cardiac catheterization  . Thyroid disease   . Vaginal delivery 1986, 2000  . Vitamin D deficiency     Past Surgical History:  Procedure Laterality Date  . CARDIAC CATHETERIZATION N/A 10/03/2014   Procedure: Left Heart Cath and Coronary Angiography;  Surgeon: Lorretta Harp, MD;  Location: [facility];  Service: Cardiovascular;  Laterality: N/A;  . DILITATION & CURRETTAGE/HYSTROSCOPY WITH NOVASURE ABLATION N/A 11/08/2014   Procedure: DILATATION & CURETTAGE/HYSTEROSCOPY WITH NOVASURE ABLATION;  Surgeon: Linda Hedges, DO;  Location: [facility];  Service: Gynecology;  Laterality: N/A;  . EYE SURGERY     laser eye surgery  . LAPAROSCOPIC VAGINAL HYSTERECTOMY WITH SALPINGECTOMY Bilateral 03/05/2015   Procedure: LAPAROSCOPIC ASSISTED VAGINAL HYSTERECTOMY WITH SALPINGECTOMY;  Surgeon: Linda Hedges, DO;  Location: [facility];  Service: Gynecology;  Laterality: Bilateral;  . TUBAL LIGATION  2000    Current Medications: Current Meds  Medication Sig  . amLODipine (NORVASC) 10 MG tablet TAKE 1 TABLET BY MOUTH EVERY DAY  . Ascorbic Acid (VITAMIN C) 1000 MG tablet Take by mouth daily.  Marland Kitchen aspirin 81 MG tablet Take 81 mg by mouth daily.  . chlorhexidine (PERIDEX) 0.12 % solution SMARTSIG:By Mouth  . Cholecalciferol (VITAMIN D3 PO) Take by mouth daily.   . furosemide (LASIX) 20 MG tablet Take 1 tablet (20 mg total) by mouth daily.  Marland Kitchen KRILL OIL PO Take 1,200 mg by mouth daily.  Marland Kitchen levothyroxine (SYNTHROID)  50 MCG tablet Take 1 tablet (50 mcg total) by mouth daily before breakfast. Needs ov  . Magnesium 500 MG CAPS Take by mouth daily.   Marland Kitchen omeprazole (PRILOSEC) 20 MG capsule Take 1 capsule (20 mg total) by mouth daily.  Marland Kitchen zinc gluconate 50 MG tablet Take 50 mg by mouth daily.     Allergies:   Lisinopril, Losartan, and Lubiprostone   Social History   Socioeconomic History  . Marital status: Divorced    Spouse name: Not on file  . Number of children: Not on file  . Years of education: Not on file  . Highest education level: Not on file  Occupational History  . Occupation: unemployed  Tobacco Use  . Smoking status: Never Smoker  . Smokeless tobacco: Never Used  . Tobacco comment: patient reports light tobacco use, has not smoked since friday (09/27/14)  Vaping Use  . Vaping Use: Never used  Substance and Sexual Activity  . Alcohol use: Yes    Alcohol/week: 6.0 standard drinks    Types: 6 Cans of beer per week  . Drug use: No  .  Sexual activity: Not Currently    Partners: Male  Other Topics Concern  . Not on file  Social History Narrative   Very little exercise   Social Determinants of Health   Financial Resource Strain: Not on file  Food Insecurity: Not on file  Transportation Needs: Not on file  Physical Activity: Not on file  Stress: Not on file  Social Connections: Not on file     Family History: The patient's family history includes Alcoholism in her father; Cancer in her father; Dementia in her maternal grandmother; Depression in her mother; Diabetes in her father; Heart disease in her cousin, maternal grandmother, and mother; Heart disease (age of onset: 51) in her maternal grandfather; Hyperlipidemia in her mother; Hypertension in her mother; Obesity in her mother; Sleep apnea in her mother. There is no history of Other.  ROS:   ROS Please see the history of present illness.     All other systems reviewed and are negative.  EKGs/Labs/Other Studies Reviewed:     The following studies were reviewed today:    Recent Labs: 07/03/2020: ALT 6; BUN 16; Creatinine 0.67; Hemoglobin 14.1; Platelet Count 289; Potassium 3.7; Sodium 140; TSH 0.54  Recent Lipid Panel    Component Value Date/Time   CHOL 199 07/03/2020 1020   TRIG 167.0 (H) 07/03/2020 1020   HDL 48.10 07/03/2020 1020   CHOLHDL 4 07/03/2020 1020   VLDL 33.4 07/03/2020 1020   LDLCALC 117 (H) 07/03/2020 1020   LDLDIRECT 167.0 01/31/2020 1121    Physical Exam:    VS:  BP 128/72 (BP Location: Right Arm, Patient Position: Sitting)   Pulse 64   Ht 5\' 6"  (1.676 m)   Wt 234 lb (106.1 kg)   LMP  (LMP Unknown)   SpO2 98%   BMI 37.77 kg/m     Wt Readings from Last 3 Encounters:  07/28/20 234 lb (106.1 kg)  07/03/20 234 lb 6.4 oz (106.3 kg)  07/03/20 234 lb (106.1 kg)     GEN:.  Tearful well nourished, well developed in no acute distress HEENT: Normal NECK: No JVD; No carotid bruits LYMPHATICS: No lymphadenopathy CARDIAC: RRR, no murmurs, rubs, gallops RESPIRATORY:  Clear to auscultation without rales, wheezing or rhonchi  ABDOMEN: Soft, non-tender, non-distended MUSCULOSKELETAL:  No edema; No deformity  SKIN: Warm and dry NEUROLOGIC:  Alert and oriented x 3 PSYCHIATRIC:  Normal affect     Signed, Shirlee More, MD  07/28/2020 9:17 AM    [place]

## 2020-07-28 ENCOUNTER — Other Ambulatory Visit: Payer: Self-pay

## 2020-07-28 ENCOUNTER — Encounter: Payer: Self-pay | Admitting: Cardiology

## 2020-07-28 ENCOUNTER — Ambulatory Visit (INDEPENDENT_AMBULATORY_CARE_PROVIDER_SITE_OTHER): Payer: Medicare Other | Admitting: Cardiology

## 2020-07-28 VITALS — BP 128/72 | HR 64 | Ht 66.0 in | Wt 234.0 lb

## 2020-07-28 DIAGNOSIS — R079 Chest pain, unspecified: Secondary | ICD-10-CM

## 2020-07-28 DIAGNOSIS — E782 Mixed hyperlipidemia: Secondary | ICD-10-CM | POA: Diagnosis not present

## 2020-07-28 DIAGNOSIS — I10 Essential (primary) hypertension: Secondary | ICD-10-CM | POA: Diagnosis not present

## 2020-07-28 DIAGNOSIS — I2510 Atherosclerotic heart disease of native coronary artery without angina pectoris: Secondary | ICD-10-CM | POA: Diagnosis not present

## 2020-07-28 MED ORDER — EZETIMIBE 10 MG PO TABS: 10.0000 mg | ORAL_TABLET | Freq: Every day | ORAL | 3 refills | Status: DC

## 2020-07-28 MED ORDER — METOPROLOL TARTRATE 50 MG PO TABS: 50.0000 mg | ORAL_TABLET | Freq: Once | ORAL | 0 refills | Status: DC

## 2020-07-28 MED ORDER — PRAVASTATIN SODIUM 20 MG PO TABS: 20.0000 mg | ORAL_TABLET | ORAL | 3 refills | Status: DC

## 2020-07-28 NOTE — Addendum Note (Signed)
Addended by: Resa Miner I on: 07/28/2020 09:24 AM   Modules accepted: Orders

## 2020-07-28 NOTE — Patient Instructions (Signed)
Medication Instructions:  Your physician has recommended you make the following change in your medication:  START: Zetia 10 mg take one tablet by mouth daily.  START: Pravastatin 20 mg take one tablet by mouth twice weekly.  *If you need a refill on your cardiac medications before your next appointment, please call your pharmacy*   Lab Work: Your physician recommends that you return for lab work in: Within one week of your cardiac CT  BMP  If you have labs (blood work) drawn today and your tests are completely normal, you will receive your results only by: Marland Kitchen MyChart Message (if you have MyChart) OR . A paper copy in the mail If you have any lab test that is abnormal or we need to change your treatment, we will call you to review the results.   Testing/Procedures: Your cardiac CT will be scheduled at  the below location:   [facility] [address] [place],  [address] [phone]  If scheduled at [facility], please arrive at the [facility] main entrance (entrance A) of [facility] 30 minutes prior to test start time. Proceed to the [facility] Radiology Department (first floor) to check-in and test prep.  Please follow these instructions carefully (unless otherwise directed):  On the Night Before the Test: . Be sure to Drink plenty of water. . Do not consume any caffeinated/decaffeinated beverages or chocolate 12 hours prior to your test. . Do not take any antihistamines 12 hours prior to your test.  On the Day of the Test: . Drink plenty of water until 1 hour prior to the test. . Do not eat any food 4 hours prior to the test. . You may take your regular medications prior to the test.  . Take metoprolol (Lopressor) two hours prior to test. . HOLD Furosemide morning of the test. . FEMALES- please wear underwire-free bra if available       After the Test: . Drink plenty of water. . After receiving IV contrast, you may experience a  mild flushed feeling. This is normal. . On occasion, you may experience a mild rash up to 24 hours after the test. This is not dangerous. If this occurs, you can take Benadryl 25 mg and increase your fluid intake. . If you experience trouble breathing, this can be serious. If it is severe call 911 IMMEDIATELY. If it is mild, please call our office. . If you take any of these medications: Glipizide/Metformin, Avandament, Glucavance, please do not take 48 hours after completing test unless otherwise instructed.   Once we have confirmed authorization from your insurance company, we will call you to set up a date and time for your test. Based on how quickly your insurance processes prior authorizations requests, please allow up to 4 weeks to be contacted for scheduling your Cardiac CT appointment. Be advised that routine Cardiac CT appointments could be scheduled as many as 8 weeks after your provider has ordered it.  For non-scheduling related questions, please contact the cardiac imaging nurse navigator should you have any questions/concerns: Marchia Bond, Cardiac Imaging Nurse Navigator Gordy Clement, Cardiac Imaging Nurse Navigator [facility] Direct Office Dial: [phone]   For scheduling needs, including cancellations and rescheduling, please call [place], [phone].     Follow-Up: At [facility], you and your health needs are our priority.  As part of our continuing mission to provide you with exceptional heart care, we have created designated Provider Care Teams.  These Care Teams include your primary Cardiologist (physician) and Advanced Practice Providers (APPs -  Physician Assistants and Nurse Practitioners) who all work together to provide you with the care you need, when you need it.  We recommend signing up for the patient portal called "MyChart".  Sign up information is provided on this After Visit Summary.  MyChart is used to connect with  patients for Virtual Visits (Telemedicine).  Patients are able to view lab/test results, encounter notes, upcoming appointments, etc.  Non-urgent messages can be sent to your provider as well.   To learn more about what you can do with MyChart, go to [email or web address].    Your next appointment:   8 week(s)  The format for your next appointment:   In Person  Provider:   Shirlee More, MD   Other Instructions

## 2020-08-04 ENCOUNTER — Encounter: Payer: Self-pay | Admitting: Family Medicine

## 2020-08-04 ENCOUNTER — Ambulatory Visit (INDEPENDENT_AMBULATORY_CARE_PROVIDER_SITE_OTHER): Payer: Medicare Other | Admitting: Family Medicine

## 2020-08-04 ENCOUNTER — Other Ambulatory Visit: Payer: Self-pay

## 2020-08-04 VITALS — BP 98/70 | HR 59 | Temp 97.5°F | Resp 18 | Ht 66.0 in | Wt 232.8 lb

## 2020-08-04 DIAGNOSIS — E785 Hyperlipidemia, unspecified: Secondary | ICD-10-CM | POA: Diagnosis not present

## 2020-08-04 DIAGNOSIS — E079 Disorder of thyroid, unspecified: Secondary | ICD-10-CM

## 2020-08-04 DIAGNOSIS — D471 Chronic myeloproliferative disease: Secondary | ICD-10-CM

## 2020-08-04 DIAGNOSIS — E039 Hypothyroidism, unspecified: Secondary | ICD-10-CM | POA: Diagnosis not present

## 2020-08-04 DIAGNOSIS — Z1211 Encounter for screening for malignant neoplasm of colon: Secondary | ICD-10-CM | POA: Diagnosis not present

## 2020-08-04 DIAGNOSIS — I10 Essential (primary) hypertension: Secondary | ICD-10-CM | POA: Diagnosis not present

## 2020-08-04 DIAGNOSIS — Z1159 Encounter for screening for other viral diseases: Secondary | ICD-10-CM

## 2020-08-04 DIAGNOSIS — Z0000 Encounter for general adult medical examination without abnormal findings: Secondary | ICD-10-CM | POA: Diagnosis not present

## 2020-08-04 DIAGNOSIS — E559 Vitamin D deficiency, unspecified: Secondary | ICD-10-CM

## 2020-08-04 NOTE — Assessment & Plan Note (Signed)
Refer to nutritionist 

## 2020-08-04 NOTE — Assessment & Plan Note (Signed)
Check tsh 

## 2020-08-04 NOTE — Assessment & Plan Note (Signed)
Check labs 

## 2020-08-04 NOTE — Patient Instructions (Signed)
Preventive Care 52-52 Years Old, Female Preventive care refers to lifestyle choices and visits with your health care provider that can promote health and wellness. This includes:  A yearly physical exam. This is also called an annual wellness visit.  Regular dental and eye exams.  Immunizations.  Screening for certain conditions.  Healthy lifestyle choices, such as: ? Eating a healthy diet. ? Getting regular exercise. ? Not using drugs or products that contain nicotine and tobacco. ? Limiting alcohol use. What can I expect for my preventive care visit? Physical exam Your health care provider will check your:  Height and weight. These may be used to calculate your BMI (body mass index). BMI is a measurement that tells if you are at a healthy weight.  Heart rate and blood pressure.  Body temperature.  Skin for abnormal spots. Counseling Your health care provider may ask you questions about your:  Past medical problems.  Family's medical history.  Alcohol, tobacco, and drug use.  Emotional well-being.  Home life and relationship well-being.  Sexual activity.  Diet, exercise, and sleep habits.  Work and work Statistician.  Access to firearms.  Method of birth control.  Menstrual cycle.  Pregnancy history. What immunizations do I need? Vaccines are usually given at various ages, according to a schedule. Your health care provider will recommend vaccines for you based on your age, medical history, and lifestyle or other factors, such as travel or where you work.   What tests do I need? Blood tests  Lipid and cholesterol levels. These may be checked every 5 years, or more often if you are over 3 years old.  Hepatitis C test.  Hepatitis B test. Screening  Lung cancer screening. You may have this screening every year starting at age 52 if you have a 30-pack-year history of smoking and currently smoke or have quit within the past 15 years.  Colorectal cancer  screening. ? All adults should have this screening starting at age 52 and continuing until age 52. ? Your health care provider may recommend screening at age 49 if you are at increased risk. ? You will have tests every 1-10 years, depending on your results and the type of screening test.  Diabetes screening. ? This is done by checking your blood sugar (glucose) after you have not eaten for a while (fasting). ? You may have this done every 1-3 years.  Mammogram. ? This may be done every 1-2 years. ? Talk with your health care provider about when you should start having regular mammograms. This may depend on whether you have a family history of breast cancer.  BRCA-related cancer screening. This may be done if you have a family history of breast, ovarian, tubal, or peritoneal cancers.  Pelvic exam and Pap test. ? This may be done every 3 years starting at age 10. ? Starting at age 11, this may be done every 5 years if you have a Pap test in combination with an HPV test. Other tests  STD (sexually transmitted disease) testing, if you are at risk.  Bone density scan. This is done to screen for osteoporosis. You may have this scan if you are at high risk for osteoporosis. Talk with your health care provider about your test results, treatment options, and if necessary, the need for more tests. Follow these instructions at home: Eating and drinking  Eat a diet that includes fresh fruits and vegetables, whole grains, lean protein, and low-fat dairy products.  Take vitamin and mineral supplements  as recommended by your health care provider.  Do not drink alcohol if: ? Your health care provider tells you not to drink. ? You are pregnant, may be pregnant, or are planning to become pregnant.  If you drink alcohol: ? Limit how much you have to 0-1 drink a day. ? Be aware of how much alcohol is in your drink. In the [place] one drink equals one 12 oz bottle of beer (355 mL), one 5 oz glass of  wine (148 mL), or one 1 oz glass of hard liquor (44 mL).   Lifestyle  Take daily care of your teeth and gums. Brush your teeth every morning and night with fluoride toothpaste. Floss one time each day.  Stay active. Exercise for at least 30 minutes 5 or more days each week.  Do not use any products that contain nicotine or tobacco, such as cigarettes, e-cigarettes, and chewing tobacco. If you need help quitting, ask your health care provider.  Do not use drugs.  If you are sexually active, practice safe sex. Use a condom or other form of protection to prevent STIs (sexually transmitted infections).  If you do not wish to become pregnant, use a form of birth control. If you plan to become pregnant, see your health care provider for a prepregnancy visit.  If told by your health care provider, take low-dose aspirin daily starting at age 50.  Find healthy ways to cope with stress, such as: ? Meditation, yoga, or listening to music. ? Journaling. ? Talking to a trusted person. ? Spending time with friends and family. Safety  Always wear your seat belt while driving or riding in a vehicle.  Do not drive: ? If you have been drinking alcohol. Do not ride with someone who has been drinking. ? When you are tired or distracted. ? While texting.  Wear a helmet and other protective equipment during sports activities.  If you have firearms in your house, make sure you follow all gun safety procedures. What's next?  Visit your health care provider once a year for an annual wellness visit.  Ask your health care provider how often you should have your eyes and teeth checked.  Stay up to date on all vaccines. This information is not intended to replace advice given to you by your health care provider. Make sure you discuss any questions you have with your health care provider. Document Revised: 12/11/2019 Document Reviewed: 11/17/2017 Elsevier Patient Education  2021 Elsevier Inc.  

## 2020-08-04 NOTE — Assessment & Plan Note (Signed)
Tolerating statin, encouraged heart healthy diet, avoid trans fats, minimize simple carbs and saturated fats. Increase exercise as tolerated 

## 2020-08-04 NOTE — Assessment & Plan Note (Signed)
Per hematology 

## 2020-08-04 NOTE — Assessment & Plan Note (Signed)
Well controlled, no changes to meds. Encouraged heart healthy diet such as the DASH diet and exercise as tolerated.  °

## 2020-08-04 NOTE — Assessment & Plan Note (Signed)
ghm utd Check labs Pt will get mammogram scheduled Colon ordered

## 2020-08-04 NOTE — Progress Notes (Signed)
Patient ID: Brianna Fletcher, female    DOB: Jul 24, 1970  Age: 50 y.o. MRN: [id number]    Subjective:  Subjective  HPI Brianna Fletcher presents for a comprehensive physical examination today. She complains of sleep disturbance and stress. She states that she is experiencing an increase in stress and lack of sleep due to her taking care of her mother, who has a Hx of dementia. She endorses having a tearful episode on 07/28/2020 due to lack of sleep and increase stress. She reports that when she is not taking care of her mother, she doesn't experience any sleep disturbance. She also complains of hot flashes. She notes that it worsen at night time. She reports that when she got Covid the symptoms relieved, however it had returned. She endorses having a hysterectomy in 2016. She denies any chest pain, SOB, fever, abdominal pain, cough, chills, sore throat, dysuria, urinary incontinence, back pain, HA, or N/V/D at this time. She endorses taking vitamins. She notes that she had gain weight due to taking care of her mother. She is requesting a referral for a nutritionist to lose weight. The pt is requesting for genetic test to test for dementia and alzheimer's markers.  Review of Systems  Constitutional: Negative for chills, fatigue and fever.  HENT: Negative for ear pain, rhinorrhea, sinus pressure, sinus pain, sore throat and tinnitus.   Eyes: Negative for pain.  Respiratory: Negative for cough, shortness of breath and wheezing.   Cardiovascular: Negative for chest pain.  Gastrointestinal: Negative for abdominal pain, anal bleeding, constipation, diarrhea, nausea and vomiting.  Endocrine:       (+) hot flashes   Genitourinary: Negative for flank pain.  Musculoskeletal: Negative for back pain and neck pain.  Skin: Negative for rash.  Neurological: Negative for seizures, weakness, light-headedness, numbness and headaches.  Psychiatric/Behavioral: Positive for sleep disturbance.       (+) stress      History Past Medical History:  Diagnosis Date  . Arthritis   . Back pain   . Blood dyscrasia    produces too many WBCs  . Chronic neutrophilia 11/15/2012  . Depression   . Edema, lower extremity   . Family history of heart disease    mother had stents placed in her 3s and had myocardial infarctions  . Fibromyalgia   . GERD (gastroesophageal reflux disease)   . Glaucoma   . Glaucoma   . High cholesterol   . Hypertension   . IBS (irritable bowel syndrome)   . Joint pain   . Leukocytosis, unspecified 11/15/2012  . Monocytosis 11/15/2012  . Myeloproliferative disorder ([place]) 03/08/2013  . Precordial chest pain   . Pruritus 11/15/2012  . Radial artery occlusion, right (HCC)    status post  right radial artery cardiac catheterization  . Thyroid disease   . Vaginal delivery 1986, 2000  . Vitamin D deficiency     She has a past surgical history that includes Tubal ligation (2000); Eye surgery; Cardiac catheterization (N/A, 10/03/2014); Dilatation & currettage/hysteroscopy with novasure ablation (N/A, 11/08/2014); and Laparoscopic vaginal hysterectomy with salpingectomy (Bilateral, 03/05/2015).   Her family history includes Alcoholism in her father; Cancer in her father; Dementia in her maternal grandmother; Depression in her mother; Diabetes in her father; Heart disease in her cousin, maternal grandmother, and mother; Heart disease (age of onset: 83) in her maternal grandfather; Hyperlipidemia in her mother; Hypertension in her mother; Obesity in her mother; Sleep apnea in her mother.She reports that she has never smoked. She has  never used smokeless tobacco. She reports current alcohol use of about 6.0 standard drinks of alcohol per week. She reports that she does not use drugs.  Current Outpatient Medications on File Prior to Visit  Medication Sig Dispense Refill  . amLODipine (NORVASC) 10 MG tablet TAKE 1 TABLET BY MOUTH EVERY DAY 90 tablet 1  . Ascorbic Acid (VITAMIN C) 1000 MG  tablet Take by mouth daily.    Marland Kitchen aspirin 81 MG tablet Take 81 mg by mouth daily.    . chlorhexidine (PERIDEX) 0.12 % solution SMARTSIG:By Mouth    . Cholecalciferol (VITAMIN D3 PO) Take by mouth daily.     Marland Kitchen ezetimibe (ZETIA) 10 MG tablet Take 1 tablet (10 mg total) by mouth daily. 90 tablet 3  . furosemide (LASIX) 20 MG tablet Take 1 tablet (20 mg total) by mouth daily. 90 tablet 1  . KRILL OIL PO Take 1,200 mg by mouth daily.    Marland Kitchen levothyroxine (SYNTHROID) 50 MCG tablet Take 1 tablet (50 mcg total) by mouth daily before breakfast. Needs ov 90 tablet 1  . Magnesium 500 MG CAPS Take by mouth daily.     Marland Kitchen omeprazole (PRILOSEC) 20 MG capsule Take 1 capsule (20 mg total) by mouth daily. 90 capsule 3  . pravastatin (PRAVACHOL) 20 MG tablet Take 1 tablet (20 mg total) by mouth 2 (two) times a week. 30 tablet 3  . zinc gluconate 50 MG tablet Take 50 mg by mouth daily.    . metoprolol tartrate (LOPRESSOR) 50 MG tablet Take 1 tablet (50 mg total) by mouth once for 1 dose. Take two hours prior to your cardiac CT 1 tablet 0   Current Facility-Administered Medications on File Prior to Visit  Medication Dose Route Frequency Provider Last Rate Last Admin  . acetaminophen (TYLENOL) tablet 650 mg  650 mg Oral Once [name], MD      . peginterferon alfa-2a (PEGASYS) injection 81 mcg  81 mcg Subcutaneous Weekly [name], MD   81 mcg at 04/26/14 1530     Objective:  Objective  Physical Exam Vitals and nursing note reviewed.  Constitutional:      General: She is not in acute distress.    Appearance: Normal appearance. She is well-developed. She is not ill-appearing.  HENT:     Head: Normocephalic and atraumatic.     Right Ear: External ear normal.     Left Ear: External ear normal.     Nose: Nose normal.  Eyes:     General:        Right eye: No discharge.        Left eye: No discharge.     Extraocular Movements: Extraocular movements intact.     Pupils: Pupils are equal, round, and  reactive to light.  Cardiovascular:     Rate and Rhythm: Normal rate and regular rhythm.     Pulses: Normal pulses.     Heart sounds: Normal heart sounds. No murmur heard. No friction rub. No gallop.   Pulmonary:     Effort: Pulmonary effort is normal. No respiratory distress.     Breath sounds: Normal breath sounds. No stridor. No wheezing, rhonchi or rales.  Chest:     Chest wall: No mass, lacerations, deformity, swelling, tenderness, crepitus or edema. There is no dullness to percussion.  Breasts:     Right: Normal. No swelling, bleeding, inverted nipple, mass, nipple discharge, skin change, tenderness or axillary adenopathy.     Left: Normal. No  swelling, bleeding, inverted nipple, mass, nipple discharge, skin change, tenderness or axillary adenopathy.    Abdominal:     General: Bowel sounds are normal. There is no distension.     Palpations: Abdomen is soft. There is no mass.     Tenderness: There is no abdominal tenderness. There is no guarding or rebound.     Hernia: No hernia is present.  Musculoskeletal:        General: Normal range of motion.     Cervical back: Normal range of motion and neck supple.     Right lower leg: No edema.     Left lower leg: No edema.  Lymphadenopathy:     Upper Body:     Right upper body: No axillary adenopathy.     Left upper body: No axillary adenopathy.  Skin:    General: Skin is warm and dry.  Neurological:     Mental Status: She is alert and oriented to person, place, and time.  Psychiatric:        Behavior: Behavior normal.        Thought Content: Thought content normal.    BP 98/70 (BP Location: Right Arm, Patient Position: Sitting, Cuff Size: Normal)   Pulse (!) 59   Temp (!) 97.5 F (36.4 C) (Oral)   Resp 18   Ht 5\' 6"  (1.676 m)   Wt 232 lb 12.8 oz (105.6 kg)   LMP  (LMP Unknown)   SpO2 97%   BMI 37.57 kg/m  Wt Readings from Last 3 Encounters:  08/04/20 232 lb 12.8 oz (105.6 kg)  07/28/20 234 lb (106.1 kg)  07/03/20  234 lb 6.4 oz (106.3 kg)     Lab Results  Component Value Date   WBC 17.1 (H) 07/03/2020   HGB 14.1 07/03/2020   HCT 43.3 07/03/2020   PLT 289 07/03/2020   GLUCOSE 98 07/03/2020   CHOL 199 07/03/2020   TRIG 167.0 (H) 07/03/2020   HDL 48.10 07/03/2020   LDLDIRECT 167.0 01/31/2020   LDLCALC 117 (H) 07/03/2020   ALT 6 07/03/2020   AST 11 (L) 07/03/2020   NA 140 07/03/2020   K 3.7 07/03/2020   CL 104 07/03/2020   CREATININE 0.67 07/03/2020   BUN 16 07/03/2020   CO2 27 07/03/2020   TSH 0.54 07/03/2020   INR 0.9 05/06/2016   MICROALBUR <0.7 04/23/2016    US Abdomen Limited  Result Date: 04/02/2020 CLINICAL DATA:  Midline/left upper quadrant palpable abdominal mass. EXAM: ULTRASOUND ABDOMEN LIMITED COMPARISON:  None. FINDINGS: Targeted sonographic evaluation of the area of palpable finding in the left upper quadrant demonstrates a 5.1 x 2.4 x 4.6 cm mildly hyperechoic ovoid mass. IMPRESSION: Palpable region left upper quadrant corresponds to an ovoid mildly hyperechoic mass measuring 5.1 x 2.4 x 4.6 cm. This most likely represents a lipoma, over the overall appearance is nonspecific. Electronically Signed   By: Miachel Roux M.D.   On: 04/02/2020 15:39     Assessment & Plan:  Plan    No orders of the defined types were placed in this encounter.   Problem List Items Addressed This Visit      Unprioritized   Colon cancer screening   Relevant Orders   Ambulatory referral to Gastroenterology   HTN (hypertension)   Relevant Orders   Lipid panel   Comprehensive metabolic panel   TSH   Hyperlipidemia    Tolerating statin, encouraged heart healthy diet, avoid trans fats, minimize simple carbs and saturated fats. Increase exercise  as tolerated      Relevant Orders   Lipid panel   Comprehensive metabolic panel   TSH   Hypertension    Well controlled, no changes to meds. Encouraged heart healthy diet such as the DASH diet and exercise as tolerated.       Relevant Orders    Lipid panel   Comprehensive metabolic panel   TSH   Hypothyroidism   Relevant Orders   TSH   Myeloproliferative disorder ([place]) (Chronic)    Per hematology      Need for hepatitis C screening test   Relevant Orders   Hepatitis C antibody   Preventative health care - Primary    ghm utd Check labs Pt will get mammogram scheduled Colon ordered        Severe obesity (BMI >= 40) (HCC)    Refer to nutritionist       Thyroid disease    Check tsh      Vitamin D deficiency    Check labs       Relevant Orders   Vitamin D (25 hydroxy)     Colonoscopy: Last completed on 02/09/2016, diverticulitis and hemorrhoid noted, repeat every 5 years.  Mammo: Last completed on 11/08/2017, results were normal, repeat every 1 year.    Pap Smear: Last completed on 07/01/2014, results were normal, repeat every 3 years.     Follow-up: Return in about 6 months (around 02/04/2021), or if symptoms worsen or fail to improve, for needs fasting labs end of June.   I,Gordon Zheng,acting as a Education administrator for [employer], DO.,have documented all relevant documentation on the behalf of Brianna Held, DO,as directed by  Brianna Held, DO while in the presence of [place], DO, have reviewed all documentation for this visit. The documentation on 08/04/20 for the exam, diagnosis, procedures, and orders are all accurate and complete. Brianna Held, DO

## 2020-08-26 ENCOUNTER — Telehealth (HOSPITAL_COMMUNITY): Payer: Self-pay | Admitting: Emergency Medicine

## 2020-08-26 ENCOUNTER — Telehealth: Payer: Self-pay | Admitting: Cardiology

## 2020-08-26 NOTE — Telephone Encounter (Signed)
Pt c/o medication issue:  1. Name of Medication:  metoprolol tartrate (LOPRESSOR) 50 MG tablet(Expired) valium  2. How are you currently taking this medication (dosage and times per day)? Has not started, will take metoprolol 2 hours prior to CT  3. Are you having a reaction (difficulty breathing--STAT)? no  4. What is your medication issue? Patient states she gets nervous with MRI type tests and would like to know if she can be given valium or something similar prior to her CT. She states in the past she was given 1 tablet to take 1 hour prior, but is not sure if it would be okay since this is for her heart and she has to take the metoprolol too.

## 2020-08-26 NOTE — Telephone Encounter (Signed)
Attempted to call patient regarding upcoming cardiac CT appointment. °Left message on voicemail with name and callback number °Camrin Lapre RN Navigator Cardiac Imaging ° [facility] °[phone] Office °[phone] Cell ° °

## 2020-08-26 NOTE — Telephone Encounter (Signed)
No, I do not think she needs a prescription for Valium

## 2020-08-26 NOTE — Telephone Encounter (Signed)
Spoke to patient just now and let her know Dr. Bettina Gavia recommendations. She verbalizes understanding and thanks me for the call back.

## 2020-08-27 ENCOUNTER — Other Ambulatory Visit: Payer: Self-pay

## 2020-08-27 ENCOUNTER — Ambulatory Visit (HOSPITAL_COMMUNITY)
Admission: RE | Admit: 2020-08-27 | Discharge: 2020-08-27 | Disposition: A | Discharge status: Home / Self Care | Payer: Medicare Other | Source: Ambulatory Visit | Attending: Cardiology | Admitting: Cardiology

## 2020-08-27 ENCOUNTER — Other Ambulatory Visit (HOSPITAL_COMMUNITY): Payer: Self-pay | Admitting: Emergency Medicine

## 2020-08-27 DIAGNOSIS — Z01812 Encounter for preprocedural laboratory examination: Secondary | ICD-10-CM

## 2020-08-27 DIAGNOSIS — R079 Chest pain, unspecified: Secondary | ICD-10-CM

## 2020-08-27 DIAGNOSIS — I10 Essential (primary) hypertension: Secondary | ICD-10-CM

## 2020-08-27 MED ORDER — METOPROLOL TARTRATE 50 MG PO TABS: 50.0000 mg | ORAL_TABLET | Freq: Once | ORAL | 0 refills | Status: DC

## 2020-08-27 NOTE — Progress Notes (Addendum)
Patient arrived for Cardiac CT today. No lab work noted within this month. Explained iStat lab work and potential for insurance to not cover lab work completed today. Patient would like to reschedule CT heart appointment. Spoke with Marchia Bond, RN Cardiac Navigator to inform of need to reschedule.  Patient to go to [place] office for labs. CT cardiac imaging rescheduled for Friday 6/10 at 9:15am per Marchia Bond RN.

## 2020-08-27 NOTE — Progress Notes (Signed)
bmet  

## 2020-08-28 ENCOUNTER — Telehealth: Payer: Self-pay

## 2020-08-28 ENCOUNTER — Telehealth (HOSPITAL_COMMUNITY): Payer: Self-pay | Admitting: Emergency Medicine

## 2020-08-28 LAB — BASIC METABOLIC PANEL
BUN/Creatinine Ratio: 17 (ref 9–23)
BUN: 11 mg/dL (ref 6–24)
CO2: 20 mmol/L (ref 20–29)
Calcium: 10.4 mg/dL — ABNORMAL HIGH (ref 8.7–10.2)
Chloride: 100 mmol/L (ref 96–106)
Creatinine, Ser: 0.66 mg/dL (ref 0.57–1.00)
Glucose: 71 mg/dL (ref 65–99)
Potassium: 4.2 mmol/L (ref 3.5–5.2)
Sodium: 137 mmol/L (ref 134–144)
eGFR: 105 mL/min/{1.73_m2} (ref >59)

## 2020-08-28 NOTE — Telephone Encounter (Signed)
-----   Message from Richardo Priest, MD sent at 08/28/2020  7:48 AM EDT ----- Normal or stable result

## 2020-08-28 NOTE — Telephone Encounter (Signed)
Reaching out to patient to offer assistance regarding upcoming cardiac imaging study; pt verbalizes understanding of appt date/time, parking situation and where to check in, pre-test NPO status and medications ordered, and verified current allergies; name and call back number provided for further questions should they arise Brianna Bond RN Navigator Cardiac Imaging [place] [facility] [phone] office [phone] cell   Metoprolol 2 hr prior to scan

## 2020-08-28 NOTE — Telephone Encounter (Signed)
Spoke with patient regarding results and recommendation.  Patient verbalizes understanding and is agreeable to plan of care. Advised patient to call back with any issues or concerns.  

## 2020-08-29 ENCOUNTER — Ambulatory Visit (HOSPITAL_COMMUNITY)
Admission: RE | Admit: 2020-08-29 | Discharge: 2020-08-29 | Disposition: A | Discharge status: Home / Self Care | Payer: Medicare Other | Source: Ambulatory Visit | Attending: Cardiology | Admitting: Cardiology

## 2020-08-29 ENCOUNTER — Telehealth: Payer: Self-pay

## 2020-08-29 ENCOUNTER — Other Ambulatory Visit: Payer: Self-pay

## 2020-08-29 DIAGNOSIS — I10 Essential (primary) hypertension: Secondary | ICD-10-CM | POA: Diagnosis not present

## 2020-08-29 DIAGNOSIS — R079 Chest pain, unspecified: Secondary | ICD-10-CM | POA: Diagnosis not present

## 2020-08-29 DIAGNOSIS — I2510 Atherosclerotic heart disease of native coronary artery without angina pectoris: Secondary | ICD-10-CM | POA: Diagnosis not present

## 2020-08-29 DIAGNOSIS — E782 Mixed hyperlipidemia: Secondary | ICD-10-CM | POA: Insufficient documentation

## 2020-08-29 MED ORDER — NITROGLYCERIN 0.4 MG SL SUBL
SUBLINGUAL_TABLET | SUBLINGUAL | Status: AC
  Administered 2020-08-29: 0.8 mg via SUBLINGUAL
  Filled 2020-08-29: qty 2

## 2020-08-29 MED ORDER — NITROGLYCERIN 0.4 MG SL SUBL: 0.8000 mg | SUBLINGUAL_TABLET | Freq: Once | SUBLINGUAL | Status: AC

## 2020-08-29 MED ORDER — IOHEXOL 350 MG/ML SOLN
100.0000 mL | Freq: Once | INTRAVENOUS | Status: AC | PRN
  Administered 2020-08-29: 100 mL via INTRAVENOUS

## 2020-08-29 NOTE — Telephone Encounter (Signed)
Left message on patients voicemail to please return our call.   

## 2020-08-29 NOTE — Telephone Encounter (Signed)
Spoke with patient regarding results and recommendation.  Patient verbalizes understanding and is agreeable to plan of care. Advised patient to call back with any issues or concerns.  

## 2020-08-29 NOTE — Telephone Encounter (Signed)
-----   Message from Richardo Priest, MD sent at 08/29/2020  3:14 PM EDT ----- Overall reviewing her history I think this is good report  Her calcium score is quite low at 2 and she is on appropriate treatment with a statin and Zetia.  She has very mild plaque primarily in the left anterior descending coronary artery and I suspect this is very similar to what was seen previously on coronary angiography.  This degree does not restrict blood flow in the heart and she does not need to be concerned about needing a stent or bypass surgery.  The other findings in her chest on the CT scan showed her esophagus to have an air-fluid level suggesting that she may be having reflux.  Again overall I think that this is a good report and I will think there is been any significant change from what was seen at her previous coronary angiogram and does not need to repeat.

## 2020-08-29 NOTE — Telephone Encounter (Signed)
Patient was returning call 

## 2020-09-01 DIAGNOSIS — M722 Plantar fascial fibromatosis: Secondary | ICD-10-CM | POA: Diagnosis not present

## 2020-09-11 NOTE — Addendum Note (Signed)
Addended by: Kelle Darting A on: 09/11/2020 02:04 PM   Modules accepted: Orders

## 2020-09-15 ENCOUNTER — Other Ambulatory Visit: Payer: Medicare Other

## 2020-09-16 ENCOUNTER — Ambulatory Visit: Payer: Medicare Other | Admitting: Family Medicine

## 2020-09-16 ENCOUNTER — Telehealth: Payer: Self-pay

## 2020-09-22 NOTE — Progress Notes (Signed)
Cardiology Office Note:    Date:  09/23/2020   ID:  SLYVIA LARTIGUE, DOB 1970/08/12, MRN [id number]  PCP:  Ann Held, DO  Cardiologist:  Shirlee More, MD    Referring MD: Carollee Herter, Alferd Apa, *    ASSESSMENT:    1. Mild CAD   2. Agatston coronary artery calcium score between 200 and 399   3. Essential hypertension   4. Mixed hyperlipidemia   5. Statin intolerance   6. Right leg pain    PLAN:    In order of problems listed above:  Mild CAD CTA initiate lipid-lowering therapy BP at target continue current treatment calcium channel blocker Start PCSK9 inhibitor absolutely statin intolerant Check duplex right lower extremity regarding superficial or deep thrombophlebitis   Next appointment: 3 months    Medication Adjustments/Labs and Tests Ordered: Current medicines are reviewed at length with the patient today.  Concerns regarding medicines are outlined above.  No orders of the defined types were placed in this encounter.  No orders of the defined types were placed in this encounter.   Chief Complaint  Patient presents with   Follow-up    History of Present Illness:    Brianna Fletcher is a 50 y.o. female with a hx of coronary artery calcification on CT scan family history of premature CAD hypertension hyperlipidemia and a myeloproliferative disorder last seen 07/28/2020 for chest pain..  Compliance with diet, lifestyle and medications: Yes She is reviewed the immobilizer for the right lower extremity for the last month and has pain and redness in her calf and is quite afraid she has DVT.  She has no history of previous thromboembolism she is not short of breath there is a focal area of more redness she has venous varicosities and we will go ahead and do a duplex but I do not think clinically this is acute DVT.  I reviewed her coronary calcium score with her she is absolutely statin intolerant even pravastatin 2 days a week with muscle pain diffusely going to  go ahead and starting PCSK9 inhibitor.  We reviewed the results of her coronary CTA showing focal minimal stenosis in the LAD at the site of previous severe calcification on CT scan and will continue medical therapy with her antihypertensives and aspirin.  Coronary angiography performed 10/03/2014 showed normal coronary arteriography and left ventricular function.  A myocardial perfusion study in 2016 showed equivocal abnormal perfusion with inferior defect perfusion and low normal ejection fraction 53% and echocardiogram showed normal left ventricular size function wall thickness left atrium was described as moderately to severely enlarged however on volume index this was mild to moderate.  CTA of the chest 2014 showed coronary artery atherosclerosis that was described as advanced for age localized left anterior descending coronary artery the thoracic aorta was normal.   Cardiac CTA showed a calcium score of 244 99th percentile for age and sex. The left main coronary artery had no atherosclerosis. The right coronary artery had minimal mixed atherosclerotic plaque proximal and distal less than 25%. Left circumflex coronary artery had minimal atherosclerotic plaque proximal less than 25%. Left anterior descending coronary artery had 25 to 49% proximal and less than 25% mid stenosis. The thoracic aorta was normal in the pulmonary artery normal in size and no left atrial appendage thrombus was noted.  08/29/2020: IMPRESSION: 1. Mild CAD, CADRADS = 2 in the proximal LAD.   2. Coronary calcium score is 244, which places the patient in the 99th percentile  for age and sex matched control.   3. Normal coronary origin with right dominance. Past Medical History:  Diagnosis Date   Arthritis    Back pain    Blood dyscrasia    produces too many WBCs   Chronic neutrophilia 11/15/2012   Depression    Edema, lower extremity    Family history of heart disease    mother had stents placed in her 3s and had  myocardial infarctions   Fibromyalgia    GERD (gastroesophageal reflux disease)    Glaucoma    Glaucoma    High cholesterol    Hypertension    IBS (irritable bowel syndrome)    Joint pain    Leukocytosis, unspecified 11/15/2012   Monocytosis 11/15/2012   Myeloproliferative disorder ([place]) 03/08/2013   Precordial chest pain    Pruritus 11/15/2012   Radial artery occlusion, right (HCC)    status post  right radial artery cardiac catheterization   Thyroid disease    Vaginal delivery 1986, 2000   Vitamin D deficiency     Past Surgical History:  Procedure Laterality Date   CARDIAC CATHETERIZATION N/A 10/03/2014   Procedure: Left Heart Cath and Coronary Angiography;  Surgeon: Lorretta Harp, MD;  Location: [facility];  Service: Cardiovascular;  Laterality: N/A;   DILITATION & CURRETTAGE/HYSTROSCOPY WITH NOVASURE ABLATION N/A 11/08/2014   Procedure: DILATATION & CURETTAGE/HYSTEROSCOPY WITH NOVASURE ABLATION;  Surgeon: Linda Hedges, DO;  Location: [facility];  Service: Gynecology;  Laterality: N/A;   EYE SURGERY     laser eye surgery   LAPAROSCOPIC VAGINAL HYSTERECTOMY WITH SALPINGECTOMY Bilateral 03/05/2015   Procedure: LAPAROSCOPIC ASSISTED VAGINAL HYSTERECTOMY WITH SALPINGECTOMY;  Surgeon: Linda Hedges, DO;  Location: [facility];  Service: Gynecology;  Laterality: Bilateral;   TUBAL LIGATION  2000    Current Medications: Current Meds  Medication Sig   amLODipine (NORVASC) 10 MG tablet TAKE 1 TABLET BY MOUTH EVERY DAY   Ascorbic Acid (VITAMIN C) 1000 MG tablet Take by mouth daily.   aspirin 81 MG tablet Take 81 mg by mouth daily.   chlorhexidine (PERIDEX) 0.12 % solution Use as directed 5 mLs in the mouth or throat daily.   Cholecalciferol (VITAMIN D3 PO) Take by mouth daily.    ezetimibe (ZETIA) 10 MG tablet Take 1 tablet (10 mg total) by mouth daily.   furosemide (LASIX) 20 MG tablet Take 1 tablet (20 mg total) by mouth daily.   KRILL OIL PO Take 1,200 mg by mouth daily.    levothyroxine (SYNTHROID) 50 MCG tablet Take 1 tablet (50 mcg total) by mouth daily before breakfast. Needs ov   Magnesium 500 MG CAPS Take by mouth daily.    omeprazole (PRILOSEC) 20 MG capsule Take 1 capsule (20 mg total) by mouth daily.   pravastatin (PRAVACHOL) 20 MG tablet Take 1 tablet (20 mg total) by mouth 2 (two) times a week.   zinc gluconate 50 MG tablet Take 50 mg by mouth daily.     Allergies:   Lisinopril, Losartan, and Lubiprostone   Social History   Socioeconomic History   Marital status: Divorced    Spouse name: Not on file   Number of children: Not on file   Years of education: Not on file   Highest education level: Not on file  Occupational History   Occupation: unemployed  Tobacco Use   Smoking status: Never   Smokeless tobacco: Never   Tobacco comments:    patient reports light tobacco use, has not smoked since friday (  09/27/14)  Vaping Use   Vaping Use: Never used  Substance and Sexual Activity   Alcohol use: Yes    Alcohol/week: 6.0 standard drinks    Types: 6 Cans of beer per week   Drug use: No   Sexual activity: Not Currently    Partners: Male  Other Topics Concern   Not on file  Social History Narrative   Very little exercise   Social Determinants of Health   Financial Resource Strain: Not on file  Food Insecurity: Not on file  Transportation Needs: Not on file  Physical Activity: Not on file  Stress: Not on file  Social Connections: Not on file     Family History: The patient's family history includes Alcoholism in her father; Cancer in her father; Dementia in her maternal grandmother; Depression in her mother; Diabetes in her father; Heart disease in her cousin, maternal grandmother, and mother; Heart disease (age of onset: 31) in her maternal grandfather; Hyperlipidemia in her mother; Hypertension in her mother; Obesity in her mother; Sleep apnea in her mother. There is no history of Other. ROS:   Please see the history of present  illness.    All other systems reviewed and are negative.  EKGs/Labs/Other Studies Reviewed:    The following studies were reviewed today:    Recent Labs: 07/03/2020: ALT 6; Hemoglobin 14.1; Platelet Count 289; TSH 0.54 08/27/2020: BUN 11; Creatinine, Ser 0.66; Potassium 4.2; Sodium 137  Recent Lipid Panel    Component Value Date/Time   CHOL 199 07/03/2020 1020   TRIG 167.0 (H) 07/03/2020 1020   HDL 48.10 07/03/2020 1020   CHOLHDL 4 07/03/2020 1020   VLDL 33.4 07/03/2020 1020   LDLCALC 117 (H) 07/03/2020 1020   LDLDIRECT 167.0 01/31/2020 1121    Physical Exam:    VS:  BP 100/70 (BP Location: Left Arm, Patient Position: Sitting, Cuff Size: Large)   Pulse 60   Ht 5\' 6"  (1.676 m)   Wt 230 lb (104.3 kg)   LMP  (LMP Unknown)   SpO2 97%   BMI 37.12 kg/m     Wt Readings from Last 3 Encounters:  09/23/20 230 lb (104.3 kg)  08/04/20 232 lb 12.8 oz (105.6 kg)  07/28/20 234 lb (106.1 kg)     GEN:  Well nourished, well developed in no acute distress HEENT: Normal NECK: No JVD; No carotid bruits LYMPHATICS: No lymphadenopathy CARDIAC: RRR, no murmurs, rubs, gallops RESPIRATORY:  Clear to auscultation without rales, wheezing or rhonchi  ABDOMEN: Soft, non-tender, non-distended MUSCULOSKELETAL:  No edema; No deformity  SKIN: Warm and dry Right lower extremity she has focal redness and tenderness in the calf that looks more like skin inflammation or even.  Possibly insect bite however she has venous insufficiency she has been immobilized for the last month we will check a duplex to exclude DVT superficial or deep NEUROLOGIC:  Alert and oriented x 3 PSYCHIATRIC:  Normal affect    Signed, Shirlee More, MD  09/23/2020 9:02 AM    [place]

## 2020-09-23 ENCOUNTER — Ambulatory Visit (HOSPITAL_COMMUNITY)
Admission: RE | Admit: 2020-09-23 | Discharge: 2020-09-23 | Disposition: A | Discharge status: Home / Self Care | Payer: Medicare Other | Source: Ambulatory Visit | Attending: Cardiology | Admitting: Cardiology

## 2020-09-23 ENCOUNTER — Other Ambulatory Visit: Payer: Self-pay

## 2020-09-23 ENCOUNTER — Ambulatory Visit (INDEPENDENT_AMBULATORY_CARE_PROVIDER_SITE_OTHER): Payer: Medicare Other | Admitting: Cardiology

## 2020-09-23 ENCOUNTER — Encounter: Payer: Self-pay | Admitting: Cardiology

## 2020-09-23 VITALS — BP 100/70 | HR 60 | Ht 66.0 in | Wt 230.0 lb

## 2020-09-23 DIAGNOSIS — E782 Mixed hyperlipidemia: Secondary | ICD-10-CM | POA: Diagnosis not present

## 2020-09-23 DIAGNOSIS — M79604 Pain in right leg: Secondary | ICD-10-CM | POA: Diagnosis not present

## 2020-09-23 DIAGNOSIS — R931 Abnormal findings on diagnostic imaging of heart and coronary circulation: Secondary | ICD-10-CM

## 2020-09-23 DIAGNOSIS — Z789 Other specified health status: Secondary | ICD-10-CM | POA: Diagnosis not present

## 2020-09-23 DIAGNOSIS — I10 Essential (primary) hypertension: Secondary | ICD-10-CM | POA: Diagnosis not present

## 2020-09-23 DIAGNOSIS — I2510 Atherosclerotic heart disease of native coronary artery without angina pectoris: Secondary | ICD-10-CM

## 2020-09-23 MED ORDER — REPATHA SURECLICK 140 MG/ML ~~LOC~~ SOAJ: 140.0000 mg | SUBCUTANEOUS | 3 refills | Status: DC

## 2020-09-23 NOTE — Progress Notes (Signed)
Right lower extremity venous duplex completed. Refer to "CV Proc" under chart review to view preliminary results.  09/23/2020 11:41 AM Kelby Aline., MHA, RVT, RDCS, RDMS

## 2020-09-23 NOTE — Patient Instructions (Signed)
Medication Instructions:  Your physician has recommended you make the following change in your medication:  START: Repatha 140 mg take one injection every 14 days.  *If you need a refill on your cardiac medications before your next appointment, please call your pharmacy*   Lab Work: Your physician recommends that you return for lab work in: 2 months CMP, Lipids If you have labs (blood work) drawn today and your tests are completely normal, you will receive your results only by: [place] (if you have [place]) OR A paper copy in the mail If you have any lab test that is abnormal or we need to change your treatment, we will call you to review the results.   Testing/Procedures: Your physician has requested that you have a lower or upper extremity venous duplex. This test is an ultrasound of the veins in the legs or arms. It looks at venous blood flow that carries blood from the heart to the legs or arms. Allow one hour for a Lower Venous exam. Allow thirty minutes for an Upper Venous exam. There are no restrictions or special instructions.     Follow-Up: At [facility], you and your health needs are our priority.  As part of our continuing mission to provide you with exceptional heart care, we have created designated Provider Care Teams.  These Care Teams include your primary Cardiologist (physician) and Advanced Practice Providers (APPs -  Physician Assistants and Nurse Practitioners) who all work together to provide you with the care you need, when you need it.  We recommend signing up for the patient portal called "MyChart".  Sign up information is provided on this After Visit Summary.  MyChart is used to connect with patients for Virtual Visits (Telemedicine).  Patients are able to view lab/test results, encounter notes, upcoming appointments, etc.  Non-urgent messages can be sent to your provider as well.   To learn more about what you can do with MyChart, go to  [email or web address].    Your next appointment:   3 month(s)  The format for your next appointment:   In Person  Provider:   Shirlee More, MD   Other Instructions

## 2020-09-25 ENCOUNTER — Other Ambulatory Visit: Payer: Self-pay

## 2020-09-25 ENCOUNTER — Telehealth: Payer: Self-pay | Admitting: *Deleted

## 2020-09-25 ENCOUNTER — Inpatient Hospital Stay: Payer: Medicare Other | Attending: Hematology & Oncology

## 2020-09-25 ENCOUNTER — Inpatient Hospital Stay (HOSPITAL_BASED_OUTPATIENT_CLINIC_OR_DEPARTMENT_OTHER): Payer: Medicare Other | Admitting: Hematology & Oncology

## 2020-09-25 VITALS — BP 129/63 | HR 57 | Temp 98.0°F | Resp 18 | Wt 233.0 lb

## 2020-09-25 DIAGNOSIS — E039 Hypothyroidism, unspecified: Secondary | ICD-10-CM | POA: Diagnosis not present

## 2020-09-25 DIAGNOSIS — D471 Chronic myeloproliferative disease: Secondary | ICD-10-CM

## 2020-09-25 LAB — CBC WITH DIFFERENTIAL (CANCER CENTER ONLY)
Abs Immature Granulocytes: 0.24 10*3/uL — ABNORMAL HIGH (ref 0.00–0.07)
Basophils Absolute: 0.1 10*3/uL (ref 0.0–0.1)
Basophils Relative: 0 %
Eosinophils Absolute: 0.1 10*3/uL (ref 0.0–0.5)
Eosinophils Relative: 1 %
HCT: 41.3 % (ref 36.0–46.0)
Hemoglobin: 13.4 g/dL (ref 12.0–15.0)
Immature Granulocytes: 2 %
Lymphocytes Relative: 20 %
Lymphs Abs: 3.3 10*3/uL (ref 0.7–4.0)
MCH: 30.9 pg (ref 26.0–34.0)
MCHC: 32.4 g/dL (ref 30.0–36.0)
MCV: 95.2 fL (ref 80.0–100.0)
Monocytes Absolute: 1.1 10*3/uL — ABNORMAL HIGH (ref 0.1–1.0)
Monocytes Relative: 7 %
Neutro Abs: 11.2 10*3/uL — ABNORMAL HIGH (ref 1.7–7.7)
Neutrophils Relative %: 70 %
Platelet Count: 279 10*3/uL (ref 150–400)
RBC: 4.34 MIL/uL (ref 3.87–5.11)
RDW: 12.3 % (ref 11.5–15.5)
WBC Count: 16 10*3/uL — ABNORMAL HIGH (ref 4.0–10.5)
nRBC: 0 % (ref 0.0–0.2)

## 2020-09-25 LAB — CMP (CANCER CENTER ONLY)
ALT: 8 U/L (ref 0–44)
AST: 11 U/L — ABNORMAL LOW (ref 15–41)
Albumin: 4.7 g/dL (ref 3.5–5.0)
Alkaline Phosphatase: 120 U/L (ref 38–126)
Anion gap: 8 (ref 5–15)
BUN: 12 mg/dL (ref 6–20)
CO2: 28 mmol/L (ref 22–32)
Calcium: 11.1 mg/dL — ABNORMAL HIGH (ref 8.9–10.3)
Chloride: 101 mmol/L (ref 98–111)
Creatinine: 0.6 mg/dL (ref 0.44–1.00)
GFR, Estimated: >60 mL/min (ref >60)
Glucose, Bld: 99 mg/dL (ref 70–99)
Potassium: 4 mmol/L (ref 3.5–5.1)
Sodium: 137 mmol/L (ref 135–145)
Total Bilirubin: 0.5 mg/dL (ref 0.3–1.2)
Total Protein: 7.4 g/dL (ref 6.5–8.1)

## 2020-09-25 LAB — SAVE SMEAR(SSMR), FOR PROVIDER SLIDE REVIEW

## 2020-09-25 LAB — LACTATE DEHYDROGENASE: LDH: 127 U/L (ref 98–192)

## 2020-09-25 NOTE — Telephone Encounter (Signed)
Per 09/25/20 los gave patient upcoming appointment - patient confirmed

## 2020-09-25 NOTE — Addendum Note (Signed)
Addended by: Melton Krebs on: 09/25/2020 02:21 PM   Modules accepted: Orders

## 2020-09-25 NOTE — Progress Notes (Signed)
Hematology and Oncology Follow Up Visit  Brianna Fletcher [id number] 02-Nov-1970 50 y.o. 09/25/2020   Principle Diagnosis:  Chronic myeloproliferative syndrome-Triple negative Sub-clinical hypothyroidism (+) p-ANCA  Current Therapy:   Peg-Interferon q weekly - q 4 week dosing - On hold due to cost Synthroid 0.050 mg po q day   Interim History: Brianna Fletcher is here today for follow-up.  She has a walking boot on her right leg.  She has tendon issues.  She has had this for about 3 weeks I think.  She otherwise seems to be doing okay.  A son came up from [place].  This is been a pleasure for her.  She is still taking care of her mom.  Her mom I think fell again.  Her mom continues to have problems from dementia.  She has a son lives up in [place].  He has a daughter.  She wants to go to visit them but cannot because of her mother's health.  She apparently was on a statin for cholesterol.  She began to have problems with myalgias.  She now is on Repatha.  She has had no fever.  There is been no cough.  She has had COVID already.  Currently, her performance status is ECOG 1.     Medications:  Allergies as of 09/25/2020       Reactions   Lisinopril Cough   Losartan Cough   Lubiprostone Nausea And Vomiting        Medication List        Accurate as of September 25, 2020  9:16 AM. If you have any questions, ask your nurse or doctor.          amLODipine 10 MG tablet Commonly known as: NORVASC TAKE 1 TABLET BY MOUTH EVERY DAY   aspirin 81 MG tablet Take 81 mg by mouth daily.   chlorhexidine 0.12 % solution Commonly known as: PERIDEX Use as directed 5 mLs in the mouth or throat daily.   ezetimibe 10 MG tablet Commonly known as: ZETIA Take 1 tablet (10 mg total) by mouth daily.   furosemide 20 MG tablet Commonly known as: LASIX Take 1 tablet (20 mg total) by mouth daily.   KRILL OIL PO Take 1,200 mg by mouth daily.   levothyroxine 50 MCG tablet Commonly known as:  SYNTHROID Take 1 tablet (50 mcg total) by mouth daily before breakfast. Needs ov   Magnesium 500 MG Caps Take by mouth daily.   omeprazole 20 MG capsule Commonly known as: PRILOSEC Take 1 capsule (20 mg total) by mouth daily.   pravastatin 20 MG tablet Commonly known as: PRAVACHOL Take 1 tablet (20 mg total) by mouth 2 (two) times a week.   Repatha SureClick 637 MG/ML [place] Generic drug: Evolocumab Inject 140 mg into the skin every 14 (fourteen) days.   vitamin C 1000 MG tablet Take by mouth daily.   VITAMIN D3 PO Take by mouth daily.   zinc gluconate 50 MG tablet Take 50 mg by mouth daily.        Allergies:  Allergies  Allergen Reactions   Lisinopril Cough   Losartan Cough   Lubiprostone Nausea And Vomiting    Past Medical History, Surgical history, Social history, and Family History were reviewed and updated.  Review of Systems: Review of Systems  Constitutional:  Positive for malaise/fatigue.  HENT: Negative.    Eyes: Negative.   Respiratory: Negative.    Cardiovascular: Negative.   Gastrointestinal:  Positive for diarrhea.  Genitourinary: Negative.   Musculoskeletal:  Positive for myalgias.  Skin: Negative.   Neurological:  Positive for dizziness.  Psychiatric/Behavioral: Negative.      Physical Exam:  weight is 233 lb (105.7 kg). Her oral temperature is 98 F (36.7 C). Her blood pressure is 129/63 and her pulse is 57 (abnormal). Her respiration is 18 and oxygen saturation is 100%.   Wt Readings from Last 3 Encounters:  09/25/20 233 lb (105.7 kg)  09/23/20 230 lb (104.3 kg)  08/04/20 232 lb 12.8 oz (105.6 kg)    Physical Exam Vitals reviewed.  HENT:     Head: Normocephalic and atraumatic.  Eyes:     Pupils: Pupils are equal, round, and reactive to light.  Cardiovascular:     Rate and Rhythm: Normal rate and regular rhythm.     Heart sounds: Normal heart sounds.  Pulmonary:     Effort: Pulmonary effort is normal.     Breath sounds: Normal  breath sounds.  Abdominal:     General: Bowel sounds are normal.     Palpations: Abdomen is soft.  Musculoskeletal:        General: No tenderness or deformity. Normal range of motion.     Cervical back: Normal range of motion.  Lymphadenopathy:     Cervical: No cervical adenopathy.  Skin:    General: Skin is warm and dry.     Findings: No erythema or rash.  Neurological:     Mental Status: She is alert and oriented to person, place, and time.  Psychiatric:        Behavior: Behavior normal.        Thought Content: Thought content normal.        Judgment: Judgment normal.   Lab Results  Component Value Date   WBC 16.0 (H) 09/25/2020   HGB 13.4 09/25/2020   HCT 41.3 09/25/2020   MCV 95.2 09/25/2020   PLT 279 09/25/2020   Lab Results  Component Value Date   FERRITIN 64 06/06/2018   IRON 82 06/06/2018   TIBC 338 06/06/2018   UIBC 256 06/06/2018   IRONPCTSAT 24 06/06/2018   Lab Results  Component Value Date   RETICCTPCT 2.1 04/12/2017   RBC 4.34 09/25/2020   RETICCTABS 116.1 11/20/2013   No results found for: KPAFRELGTCHN, LAMBDASER, KAPLAMBRATIO No results found for: IGGSERUM, IGA, IGMSERUM No results found for: Kathrynn Ducking, MSPIKE, SPEI   Chemistry      Component Value Date/Time   NA 137 09/25/2020 0817   NA 137 08/27/2020 1459   NA 140 02/08/2017 0925   NA 136 03/10/2016 1147   K 4.0 09/25/2020 0817   K 3.4 02/08/2017 0925   K 3.9 03/10/2016 1147   CL 101 09/25/2020 0817   CL 105 02/08/2017 0925   CO2 28 09/25/2020 0817   CO2 26 02/08/2017 0925   CO2 24 03/10/2016 1147   BUN 12 09/25/2020 0817   BUN 11 08/27/2020 1459   BUN 10 02/08/2017 0925   BUN 10.2 03/10/2016 1147   CREATININE 0.60 09/25/2020 0817   CREATININE 0.7 02/08/2017 0925   CREATININE 0.7 03/10/2016 1147      Component Value Date/Time   CALCIUM 11.1 (H) 09/25/2020 0817   CALCIUM 10.4 (H) 02/08/2017 0925   CALCIUM 10.7 (H) 03/10/2016 1147    ALKPHOS 120 09/25/2020 0817   ALKPHOS 106 (H) 02/08/2017 0925   ALKPHOS 132 03/10/2016 1147   AST 11 (L) 09/25/2020 0817   AST  12 03/10/2016 1147   ALT 8 09/25/2020 0817   ALT 16 02/08/2017 0925   ALT 8 03/10/2016 1147   BILITOT 0.5 09/25/2020 0817   BILITOT 0.63 03/10/2016 1147      Impression and Plan: Brianna Fletcher is a very pleasant 50 yo caucasian female with a triple negative myeloproliferative syndrome though all work up has been negative so far.   I am happy that her white cell count looks great.  I thought that it might be higher because of the injury that she had with respect to the right leg.  However, everything looks very stable.  Her liver function tests look fine.  The statin that she was taking was not a problem for that.  We will plan to get her back in 4 months now.  I think that we can bring her appointments out a little bit further.  I just hope that she gets the walking boot off her right leg soon.      [name], MD 7/7/20229:16 AM

## 2020-10-01 DIAGNOSIS — M722 Plantar fascial fibromatosis: Secondary | ICD-10-CM | POA: Diagnosis not present

## 2020-10-07 ENCOUNTER — Telehealth: Payer: Self-pay

## 2020-10-07 NOTE — Telephone Encounter (Signed)
Repatha approved per Brianna Fletcher in appeal dept. Confirmation number# [id number] from 10/06/20 for 6 months. Confirmation letter is being faxed

## 2020-10-08 DIAGNOSIS — L810 Postinflammatory hyperpigmentation: Secondary | ICD-10-CM | POA: Diagnosis not present

## 2020-10-13 ENCOUNTER — Telehealth: Payer: Self-pay

## 2020-10-13 NOTE — Telephone Encounter (Signed)
Follow Up:    Patient is retuning Brianna Fletcher's call fom today.

## 2020-10-14 NOTE — Telephone Encounter (Signed)
Spoke with patient regarding Repatha approval. Patient states that she received her approval letter yesterday and will take to pharmacy when she picks rx up.

## 2020-11-19 ENCOUNTER — Telehealth: Payer: Self-pay | Admitting: Family Medicine

## 2020-11-19 NOTE — Telephone Encounter (Signed)
Left message for patient to call back and schedule Medicare Annual Wellness Visit (AWV) to be done by telephone.  No hx of AWV eligible as of 02/20/19  Please schedule at anytime with [place].      40 Minutes appointment   Any questions, please call me at [phone]

## 2020-12-01 DIAGNOSIS — H905 Unspecified sensorineural hearing loss: Secondary | ICD-10-CM | POA: Diagnosis not present

## 2020-12-02 ENCOUNTER — Ambulatory Visit: Payer: Medicare Other

## 2020-12-02 DIAGNOSIS — S90562A Insect bite (nonvenomous), left ankle, initial encounter: Secondary | ICD-10-CM | POA: Diagnosis not present

## 2020-12-02 DIAGNOSIS — R21 Rash and other nonspecific skin eruption: Secondary | ICD-10-CM | POA: Diagnosis not present

## 2020-12-09 DIAGNOSIS — D225 Melanocytic nevi of trunk: Secondary | ICD-10-CM | POA: Diagnosis not present

## 2020-12-09 DIAGNOSIS — D2239 Melanocytic nevi of other parts of face: Secondary | ICD-10-CM | POA: Diagnosis not present

## 2020-12-09 DIAGNOSIS — L309 Dermatitis, unspecified: Secondary | ICD-10-CM | POA: Diagnosis not present

## 2020-12-09 DIAGNOSIS — D1801 Hemangioma of skin and subcutaneous tissue: Secondary | ICD-10-CM | POA: Diagnosis not present

## 2020-12-10 ENCOUNTER — Telehealth: Payer: Self-pay | Admitting: *Deleted

## 2020-12-10 ENCOUNTER — Ambulatory Visit (INDEPENDENT_AMBULATORY_CARE_PROVIDER_SITE_OTHER): Payer: Medicare Other

## 2020-12-10 VITALS — Ht 66.0 in | Wt 229.0 lb

## 2020-12-10 DIAGNOSIS — R059 Cough, unspecified: Secondary | ICD-10-CM | POA: Diagnosis not present

## 2020-12-10 DIAGNOSIS — Z0000 Encounter for general adult medical examination without abnormal findings: Secondary | ICD-10-CM

## 2020-12-10 DIAGNOSIS — J029 Acute pharyngitis, unspecified: Secondary | ICD-10-CM | POA: Diagnosis not present

## 2020-12-10 NOTE — Telephone Encounter (Signed)
Received call from patient stating that she had an exposure to [place] and is now experiencing symptoms.  Tested negative.  Told patient it is best to reschedule at this time and continue testing.  PAtient in agreement. Schedulers notified.

## 2020-12-10 NOTE — Progress Notes (Signed)
Subjective:   Brianna Fletcher is a 50 y.o. female who presents for an Initial Medicare Annual Wellness Visit.  I connected with Cosette today by telephone and verified that I am speaking with the correct person using two identifiers. Location patient: home Location provider: work Persons participating in the virtual visit: patient, Marine scientist.    I discussed the limitations, risks, security and privacy concerns of performing an evaluation and management service by telephone and the availability of in person appointments. I also discussed with the patient that there may be a patient responsible charge related to this service. The patient expressed understanding and verbally consented to this telephonic visit.    Interactive audio and video telecommunications were attempted between this provider and patient, however failed, due to patient having technical difficulties OR patient did not have access to video capability.  We continued and completed visit with audio only.  Some vital signs may be absent or patient reported.   Time Spent with patient on telephone encounter: 40 minutes   Review of Systems     Cardiac Risk Factors include: dyslipidemia;hypertension;obesity (BMI >30kg/m2);sedentary lifestyle     Objective:    Today's Vitals   12/10/20 0741  Weight: 229 lb (103.9 kg)  Height: 5\' 6"  (1.676 m)   Body mass index is 36.96 kg/m.  Advanced Directives 12/10/2020 07/03/2020 05/08/2020 01/21/2020 10/11/2019 07/13/2019 04/11/2019  Does Patient Have a Medical Advance Directive? No No No No No No No  Would patient like information on creating a medical advance directive? No - Patient declined No - Patient declined - - No - Patient declined No - Patient declined No - Patient declined    Current Medications (verified) Outpatient Encounter Medications as of 12/10/2020  Medication Sig   amLODipine (NORVASC) 10 MG tablet TAKE 1 TABLET BY MOUTH EVERY DAY   Ascorbic Acid (VITAMIN C) 1000 MG tablet  Take by mouth daily.   aspirin 81 MG tablet Take 81 mg by mouth daily.   chlorhexidine (PERIDEX) 0.12 % solution Use as directed 5 mLs in the mouth or throat daily.   Cholecalciferol (VITAMIN D3 PO) Take by mouth daily.    furosemide (LASIX) 20 MG tablet Take 1 tablet (20 mg total) by mouth daily.   KRILL OIL PO Take 1,200 mg by mouth daily.   levothyroxine (SYNTHROID) 50 MCG tablet Take 1 tablet (50 mcg total) by mouth daily before breakfast. Needs ov   Magnesium 500 MG CAPS Take by mouth daily.    omeprazole (PRILOSEC) 20 MG capsule Take 1 capsule (20 mg total) by mouth daily.   zinc gluconate 50 MG tablet Take 50 mg by mouth daily.   Evolocumab (REPATHA SURECLICK) 224 MG/ML SOAJ Inject 140 mg into the skin every 14 (fourteen) days. (Patient not taking: Reported on 12/10/2020)   ezetimibe (ZETIA) 10 MG tablet Take 1 tablet (10 mg total) by mouth daily.   Facility-Administered Encounter Medications as of 12/10/2020  Medication   acetaminophen (TYLENOL) tablet 650 mg   peginterferon alfa-2a (PEGASYS) injection 81 mcg    Allergies (verified) Lisinopril, Losartan, Lubiprostone, and Pravachol [pravastatin sodium]   History: Past Medical History:  Diagnosis Date   Arthritis    Back pain    Blood dyscrasia    produces too many WBCs   Chronic neutrophilia 11/15/2012   Depression    Edema, lower extremity    Family history of heart disease    mother had stents placed in her 74s and had myocardial infarctions   Fibromyalgia  GERD (gastroesophageal reflux disease)    Glaucoma    Glaucoma    High cholesterol    Hypertension    IBS (irritable bowel syndrome)    Joint pain    Leukocytosis, unspecified 11/15/2012   Monocytosis 11/15/2012   Myeloproliferative disorder ([place]) 03/08/2013   Precordial chest pain    Pruritus 11/15/2012   Radial artery occlusion, right (HCC)    status post  right radial artery cardiac catheterization   Thyroid disease    Vaginal delivery 1986, 2000    Vitamin D deficiency    Past Surgical History:  Procedure Laterality Date   CARDIAC CATHETERIZATION N/A 10/03/2014   Procedure: Left Heart Cath and Coronary Angiography;  Surgeon: Lorretta Harp, MD;  Location: [facility];  Service: Cardiovascular;  Laterality: N/A;   DILITATION & CURRETTAGE/HYSTROSCOPY WITH NOVASURE ABLATION N/A 11/08/2014   Procedure: DILATATION & CURETTAGE/HYSTEROSCOPY WITH NOVASURE ABLATION;  Surgeon: Linda Hedges, DO;  Location: [facility];  Service: Gynecology;  Laterality: N/A;   EYE SURGERY     laser eye surgery   LAPAROSCOPIC VAGINAL HYSTERECTOMY WITH SALPINGECTOMY Bilateral 03/05/2015   Procedure: LAPAROSCOPIC ASSISTED VAGINAL HYSTERECTOMY WITH SALPINGECTOMY;  Surgeon: Linda Hedges, DO;  Location: [facility];  Service: Gynecology;  Laterality: Bilateral;   TUBAL LIGATION  2000   Family History  Problem Relation Age of Onset   Heart disease Mother    Hypertension Mother    Hyperlipidemia Mother    Depression Mother    Sleep apnea Mother    Obesity Mother    Diabetes Father    Cancer Father        esopageal ?   Alcoholism Father    Dementia Maternal Grandmother    Heart disease Maternal Grandmother    Heart disease Maternal Grandfather 5       MI   Heart disease Cousin    Other Neg Hx        hyperparathyroidism   Social History   Socioeconomic History   Marital status: Divorced    Spouse name: Not on file   Number of children: Not on file   Years of education: Not on file   Highest education level: Not on file  Occupational History   Occupation: unemployed  Tobacco Use   Smoking status: Never   Smokeless tobacco: Never   Tobacco comments:    patient reports light tobacco use, has not smoked since friday (09/27/14)  Vaping Use   Vaping Use: Never used  Substance and Sexual Activity   Alcohol use: Yes    Alcohol/week: 6.0 standard drinks    Types: 6 Cans of beer per week   Drug use: No   Sexual activity: Not Currently    Partners: Male   Other Topics Concern   Not on file  Social History Narrative   Very little exercise   Social Determinants of Health   Financial Resource Strain: Low Risk    Difficulty of Paying Living Expenses: Not hard at all  Food Insecurity: No Food Insecurity   Worried About Charity fundraiser in the Last Year: Never true   [place] in the Last Year: Never true  Transportation Needs: No Transportation Needs   Lack of Transportation (Medical): No   Lack of Transportation (Non-Medical): No  Physical Activity: Inactive   Days of Exercise per Week: 0 days   Minutes of Exercise per Session: 0 min  Stress: No Stress Concern Present   Feeling of Stress : Only a little  Social Connections: Moderately Isolated   Frequency of Communication with Friends and Family: More than three times a week   Frequency of Social Gatherings with Friends and Family: More than three times a week   Attends Religious Services: Never   Marine scientist or Organizations: No   Attends Music therapist: Never   Marital Status: Living with partner    Tobacco Counseling Counseling given: Not Answered Tobacco comments: patient reports light tobacco use, has not smoked since friday (09/27/14)   Clinical Intake:  Pre-visit preparation completed: Yes  Pain : No/denies pain     BMI - recorded: 36.96 Nutritional Status: BMI > 30  Obese Nutritional Risks: None Diabetes: No  How often do you need to have someone help you when you read instructions, pamphlets, or other written materials from your doctor or pharmacy?: 1 - Never  Diabetic?No  Interpreter Needed?: No  Information entered by :: Caroleen Hamman LPN   Activities of Daily Living In your present state of health, do you have any difficulty performing the following activities: 12/10/2020  Hearing? N  Vision? N  Difficulty concentrating or making decisions? N  Walking or climbing stairs? N  Dressing or bathing? N  Doing errands,  shopping? N  Preparing Food and eating ? N  Using the Toilet? Y  In the past six months, have you accidently leaked urine? N  Do you have problems with loss of bowel control? N  Managing your Medications? N  Managing your Finances? N  Housekeeping or managing your Housekeeping? N  Some recent data might be hidden    Patient Care Team: Carollee Herter, Alferd Apa, DO as PCP - General (Family Medicine) Linda Hedges, DO as Consulting Physician (Obstetrics and Gynecology) Marin Olp Rudell Cobb, MD as Consulting Physician (Oncology)  Indicate any recent Medical Services you may have received from other than [facility] providers in the past year (date may be approximate).     Assessment:   This is a routine wellness examination for Brianna Fletcher.  Hearing/Vision screen Hearing Screening - Comments:: Bilateral hearing aids Vision Screening - Comments:: Last eye exam-several years ago  Dietary issues and exercise activities discussed: Current Exercise Habits: The patient does not participate in regular exercise at present, Exercise limited by: None identified   Goals Addressed             This Visit's Progress    Patient Stated       Continue to eat healthy & drink plenty of water & try to lose some weight.       Depression Screen PHQ 2/9 Scores 12/10/2020 08/04/2020 07/03/2020 05/24/2019 04/13/2018 06/01/2016 05/11/2013  PHQ - 2 Score 0 0 0 4 0 0 0  PHQ- 9 Score - - - 9 3 - -    Fall Risk Fall Risk  12/10/2020 08/04/2020 06/01/2016 04/12/2016 12/04/2015  Falls in the past year? 0 0 No No No  Number falls in past yr: 0 - - - -  Injury with Fall? 0 - - - -  Risk for fall due to : - - - - -  Follow up Falls prevention discussed Falls evaluation completed - - -    FALL RISK PREVENTION PERTAINING TO THE HOME:  Any stairs in or around the home? Yes  If so, are there any without handrails? No  Home free of loose throw rugs in walkways, pet beds, electrical cords, etc? Yes  Adequate lighting in your home to  reduce risk of falls? Yes  ASSISTIVE DEVICES UTILIZED TO PREVENT FALLS:  Life alert? No Use of a cane, walker or w/c? No  Grab bars in the bathroom? No  Shower chair or bench in shower? No  Elevated toilet seat or a handicapped toilet? No   TIMED UP AND GO:  Was the test performed? No . Phone visit   Cognitive Function:Normal cognitive status assessed by this Nurse Health Advisor. No abnormalities found.          Immunizations Immunization History  Administered Date(s) Administered   Influenza Split 01/08/2013   Influenza,inj,Quad PF,6+ Mos 11/29/2014, 02/06/2016, 12/08/2018   PFIZER(Purple Top)SARS-COV-2 Vaccination 06/07/2019, 07/02/2019, 01/09/2020   Tdap 07/01/2014    TDAP status: Up to date  Flu Vaccine status: Due, Education has been provided regarding the importance of this vaccine. Advised may receive this vaccine at local pharmacy or Health Dept. Aware to provide a copy of the vaccination record if obtained from local pharmacy or Health Dept. Verbalized acceptance and understanding.  Pneumococcal vaccine status: Due at age 64  Covid-19 vaccine status: Information provided on how to obtain vaccines. Booster due  Qualifies for Shingles Vaccine? Yes   Zostavax completed No   Shingrix Completed?: No.    Education has been provided regarding the importance of this vaccine. Patient has been advised to call insurance company to determine out of pocket expense if they have not yet received this vaccine. Advised may also receive vaccine at local pharmacy or Health Dept. Verbalized acceptance and understanding.  Screening Tests Health Maintenance  Topic Date Due   Hepatitis C Screening  Never done   Zoster Vaccines- Shingrix (1 of 2) Never done   MAMMOGRAM  11/09/2018   COLONOSCOPY (Pts 45-30yrs Insurance coverage will need to be confirmed)  02/11/2020   COVID-19 Vaccine (4 - Booster for Pfizer series) 04/02/2020   INFLUENZA VACCINE  10/20/2020   TETANUS/TDAP   06/30/2024   HIV Screening  Completed   HPV VACCINES  Aged Out   PAP SMEAR-Modifier  Discontinued    Health Maintenance  Health Maintenance Due  Topic Date Due   Hepatitis C Screening  Never done   Zoster Vaccines- Shingrix (1 of 2) Never done   MAMMOGRAM  11/09/2018   COLONOSCOPY (Pts 45-54yrs Insurance coverage will need to be confirmed)  02/11/2020   COVID-19 Vaccine (4 - Booster for [place] series) 04/02/2020   INFLUENZA VACCINE  10/20/2020    Colorectal cancer screening: Due-Patient states she will call GI & schedule when she has time.  Mammogram status: Due-Patient states she will call & schedule when she has time.  Bone Density status: Due at age 13  Lung Cancer Screening: (Low Dose CT Chest recommended if Age 21-80 years, 30 pack-year currently smoking OR have quit w/in 15years.) does not qualify.     Additional Screening:  Hepatitis C Screening: does not qualify  Vision Screening: Recommended annual ophthalmology exams for early detection of glaucoma and other disorders of the eye. Is the patient up to date with their annual eye exam?  No  Who is the provider or what is the name of the office in which the patient attends annual eye exams? Dr. Justice Deeds plans to make an appt soon.   Dental Screening: Recommended annual dental exams for proper oral hygiene  Community Resource Referral / Chronic Care Management: CRR required this visit?  No   CCM required this visit?  No      Plan:     I have personally reviewed and noted the following  in the patient's chart:   Medical and social history Use of alcohol, tobacco or illicit drugs  Current medications and supplements including opioid prescriptions. Patient is not currently taking opioid prescriptions. Functional ability and status Nutritional status Physical activity Advanced directives List of other physicians Hospitalizations, surgeries, and ER visits in previous 12 months Vitals Screenings to  include cognitive, depression, and falls Referrals and appointments  In addition, I have reviewed and discussed with patient certain preventive protocols, quality metrics, and best practice recommendations. A written personalized care plan for preventive services as well as general preventive health recommendations were provided to patient.   Due to this being a telephonic visit, the after visit summary with patients personalized plan was offered to patient via mail or my-chart.  Patient would like to access on my-chart.   Marta Antu, LPN   [id number]  Nurse Health Advisor  Nurse Notes: None

## 2020-12-10 NOTE — Patient Instructions (Addendum)
Brianna Fletcher , Thank you for taking time to complete your Medicare Wellness Visit. I appreciate your ongoing commitment to your health goals. Please review the following plan we discussed and let me know if I can assist you in the future.   Screening recommendations/referrals: Colonoscopy: Per our conversation, you will call GI to schedule. Mammogram: Per our conversation, you will call to schedule. Bone Density: Due at age 50 Recommended yearly ophthalmology/optometry visit for glaucoma screening and checkup Recommended yearly dental visit for hygiene and checkup  Vaccinations: Influenza vaccine: Due-May obtain vaccine at our office or your local pharmacy. Pneumococcal vaccine: Due at age 69 Tdap vaccine: Up to date-Due 06/30/2024 Shingles vaccine: Discuss with pharmacy  Covid-19: Booster available at the pharmacy.  Advanced directives: Declined information today.  Conditions/risks identified: See problem list  Next appointment: Follow up in one year for your annual wellness visit.   Preventive Care 40-64 Years, Female Preventive care refers to lifestyle choices and visits with your health care provider that can promote health and wellness. What does preventive care include? A yearly physical exam. This is also called an annual well check. Dental exams once or twice a year. Routine eye exams. Ask your health care provider how often you should have your eyes checked. Personal lifestyle choices, including: Daily care of your teeth and gums. Regular physical activity. Eating a healthy diet. Avoiding tobacco and drug use. Limiting alcohol use. Practicing safe sex. Taking low-dose aspirin daily starting at age 12. Taking vitamin and mineral supplements as recommended by your health care provider. What happens during an annual well check? The services and screenings done by your health care provider during your annual well check will depend on your age, overall health, lifestyle risk  factors, and family history of disease. Counseling  Your health care provider may ask you questions about your: Alcohol use. Tobacco use. Drug use. Emotional well-being. Home and relationship well-being. Sexual activity. Eating habits. Work and work Statistician. Method of birth control. Menstrual cycle. Pregnancy history. Screening  You may have the following tests or measurements: Height, weight, and BMI. Blood pressure. Lipid and cholesterol levels. These may be checked every 5 years, or more frequently if you are over 58 years old. Skin check. Lung cancer screening. You may have this screening every year starting at age 69 if you have a 30-pack-year history of smoking and currently smoke or have quit within the past 15 years. Fecal occult blood test (FOBT) of the stool. You may have this test every year starting at age 36. Flexible sigmoidoscopy or colonoscopy. You may have a sigmoidoscopy every 5 years or a colonoscopy every 10 years starting at age 56. Hepatitis C blood test. Hepatitis B blood test. Sexually transmitted disease (STD) testing. Diabetes screening. This is done by checking your blood sugar (glucose) after you have not eaten for a while (fasting). You may have this done every 1-3 years. Mammogram. This may be done every 1-2 years. Talk to your health care provider about when you should start having regular mammograms. This may depend on whether you have a family history of breast cancer. BRCA-related cancer screening. This may be done if you have a family history of breast, ovarian, tubal, or peritoneal cancers. Pelvic exam and Pap test. This may be done every 3 years starting at age 49. Starting at age 77, this may be done every 5 years if you have a Pap test in combination with an HPV test. Bone density scan. This is done to screen  for osteoporosis. You may have this scan if you are at high risk for osteoporosis. Discuss your test results, treatment options, and if  necessary, the need for more tests with your health care provider. Vaccines  Your health care provider may recommend certain vaccines, such as: Influenza vaccine. This is recommended every year. Tetanus, diphtheria, and acellular pertussis (Tdap, Td) vaccine. You may need a Td booster every 10 years. Zoster vaccine. You may need this after age 37. Pneumococcal 13-valent conjugate (PCV13) vaccine. You may need this if you have certain conditions and were not previously vaccinated. Pneumococcal polysaccharide (PPSV23) vaccine. You may need one or two doses if you smoke cigarettes or if you have certain conditions. Talk to your health care provider about which screenings and vaccines you need and how often you need them. This information is not intended to replace advice given to you by your health care provider. Make sure you discuss any questions you have with your health care provider. Document Released: 04/04/2015 Document Revised: 11/26/2015 Document Reviewed: 01/07/2015 Elsevier Interactive Patient Education  2017 [place] Prevention in the Home Falls can cause injuries. They can happen to people of all ages. There are many things you can do to make your home safe and to help prevent falls. What can I do on the outside of my home? Regularly fix the edges of walkways and driveways and fix any cracks. Remove anything that might make you trip as you walk through a door, such as a raised step or threshold. Trim any bushes or trees on the path to your home. Use bright outdoor lighting. Clear any walking paths of anything that might make someone trip, such as rocks or tools. Regularly check to see if handrails are loose or broken. Make sure that both sides of any steps have handrails. Any raised decks and porches should have guardrails on the edges. Have any leaves, snow, or ice cleared regularly. Use sand or salt on walking paths during winter. Clean up any spills in your  garage right away. This includes oil or grease spills. What can I do in the bathroom? Use night lights. Install grab bars by the toilet and in the tub and shower. Do not use towel bars as grab bars. Use non-skid mats or decals in the tub or shower. If you need to sit down in the shower, use a plastic, non-slip stool. Keep the floor dry. Clean up any water that spills on the floor as soon as it happens. Remove soap buildup in the tub or shower regularly. Attach bath mats securely with double-sided non-slip rug tape. Do not have throw rugs and other things on the floor that can make you trip. What can I do in the bedroom? Use night lights. Make sure that you have a light by your bed that is easy to reach. Do not use any sheets or blankets that are too big for your bed. They should not hang down onto the floor. Have a firm chair that has side arms. You can use this for support while you get dressed. Do not have throw rugs and other things on the floor that can make you trip. What can I do in the kitchen? Clean up any spills right away. Avoid walking on wet floors. Keep items that you use a lot in easy-to-reach places. If you need to reach something above you, use a strong step stool that has a grab bar. Keep electrical cords out of the way. Do  not use floor polish or wax that makes floors slippery. If you must use wax, use non-skid floor wax. Do not have throw rugs and other things on the floor that can make you trip. What can I do with my stairs? Do not leave any items on the stairs. Make sure that there are handrails on both sides of the stairs and use them. Fix handrails that are broken or loose. Make sure that handrails are as long as the stairways. Check any carpeting to make sure that it is firmly attached to the stairs. Fix any carpet that is loose or worn. Avoid having throw rugs at the top or bottom of the stairs. If you do have throw rugs, attach them to the floor with carpet  tape. Make sure that you have a light switch at the top of the stairs and the bottom of the stairs. If you do not have them, ask someone to add them for you. What else can I do to help prevent falls? Wear shoes that: Do not have high heels. Have rubber bottoms. Are comfortable and fit you well. Are closed at the toe. Do not wear sandals. If you use a stepladder: Make sure that it is fully opened. Do not climb a closed stepladder. Make sure that both sides of the stepladder are locked into place. Ask someone to hold it for you, if possible. Clearly mark and make sure that you can see: Any grab bars or handrails. First and last steps. Where the edge of each step is. Use tools that help you move around (mobility aids) if they are needed. These include: Canes. Walkers. Scooters. Crutches. Turn on the lights when you go into a dark area. Replace any light bulbs as soon as they burn out. Set up your furniture so you have a clear path. Avoid moving your furniture around. If any of your floors are uneven, fix them. If there are any pets around you, be aware of where they are. Review your medicines with your doctor. Some medicines can make you feel dizzy. This can increase your chance of falling. Ask your doctor what other things that you can do to help prevent falls. This information is not intended to replace advice given to you by your health care provider. Make sure you discuss any questions you have with your health care provider. Document Released: 01/02/2009 Document Revised: 08/14/2015 Document Reviewed: 04/12/2014 Elsevier Interactive Patient Education  2017 [employer].

## 2020-12-11 ENCOUNTER — Inpatient Hospital Stay: Payer: Medicare Other

## 2020-12-11 ENCOUNTER — Inpatient Hospital Stay: Payer: Medicare Other | Admitting: Hematology & Oncology

## 2020-12-12 DIAGNOSIS — R051 Acute cough: Secondary | ICD-10-CM | POA: Diagnosis not present

## 2020-12-12 DIAGNOSIS — R0981 Nasal congestion: Secondary | ICD-10-CM | POA: Diagnosis not present

## 2020-12-12 DIAGNOSIS — M791 Myalgia, unspecified site: Secondary | ICD-10-CM | POA: Diagnosis not present

## 2020-12-12 DIAGNOSIS — R070 Pain in throat: Secondary | ICD-10-CM | POA: Diagnosis not present

## 2020-12-12 DIAGNOSIS — J0100 Acute maxillary sinusitis, unspecified: Secondary | ICD-10-CM | POA: Diagnosis not present

## 2020-12-13 ENCOUNTER — Other Ambulatory Visit: Payer: Self-pay | Admitting: Family Medicine

## 2020-12-13 DIAGNOSIS — R600 Localized edema: Secondary | ICD-10-CM

## 2020-12-13 DIAGNOSIS — E041 Nontoxic single thyroid nodule: Secondary | ICD-10-CM

## 2020-12-13 DIAGNOSIS — D471 Chronic myeloproliferative disease: Secondary | ICD-10-CM

## 2020-12-13 DIAGNOSIS — E039 Hypothyroidism, unspecified: Secondary | ICD-10-CM

## 2020-12-24 ENCOUNTER — Encounter: Payer: Self-pay | Admitting: Cardiology

## 2020-12-24 ENCOUNTER — Other Ambulatory Visit: Payer: Self-pay

## 2020-12-24 ENCOUNTER — Ambulatory Visit (INDEPENDENT_AMBULATORY_CARE_PROVIDER_SITE_OTHER): Payer: Medicare Other | Admitting: Cardiology

## 2020-12-24 VITALS — BP 131/79 | HR 62 | Ht 66.0 in | Wt 231.4 lb

## 2020-12-24 DIAGNOSIS — I2510 Atherosclerotic heart disease of native coronary artery without angina pectoris: Secondary | ICD-10-CM

## 2020-12-24 DIAGNOSIS — Z789 Other specified health status: Secondary | ICD-10-CM

## 2020-12-24 DIAGNOSIS — R931 Abnormal findings on diagnostic imaging of heart and coronary circulation: Secondary | ICD-10-CM | POA: Diagnosis not present

## 2020-12-24 DIAGNOSIS — E782 Mixed hyperlipidemia: Secondary | ICD-10-CM

## 2020-12-24 DIAGNOSIS — I10 Essential (primary) hypertension: Secondary | ICD-10-CM | POA: Diagnosis not present

## 2020-12-24 NOTE — Addendum Note (Signed)
Addended by: Resa Miner I on: 12/24/2020 02:16 PM   Modules accepted: Orders

## 2020-12-24 NOTE — Progress Notes (Signed)
Cardiology Office Note:    Date:  12/24/2020   ID:  Brianna Fletcher, DOB 1971/01/15, MRN [id number]  PCP:  Ann Held, DO  Cardiologist:  Shirlee More, MD    Referring MD: Carollee Herter, Alferd Apa, *    ASSESSMENT:    1. Agatston coronary artery calcium score between 200 and 399   2. Mild CAD   3. Mixed hyperlipidemia   4. Statin intolerance   5. Essential hypertension    PLAN:    In order of problems listed above:  She has a high calcium score mild CAD hyperlipidemia statin intolerance she has been improved has PCSK9 inhibitor we will start tonight after recovering from COVID-19.  After 3 doses 6 weeks we will check a fasting lipid profile and continue Zetia Stable hypertension continue treatment including calcium channel blocker   Next appointment: I will plan to see her back in the office 1 year   Medication Adjustments/Labs and Tests Ordered: Current medicines are reviewed at length with the patient today.  Concerns regarding medicines are outlined above.  No orders of the defined types were placed in this encounter.  No orders of the defined types were placed in this encounter.   No chief complaint on file.   History of Present Illness:    Brianna Fletcher is a 50 y.o. female with a hx of mild CAD on cardiac CTA showing less than 25% right coronary artery left circumflex coronary artery 25 to 49% proximal LAD plaque 08/29/2020 with a calcium score quite elevated 244, hypertension hyperlipidemia and a myeloproliferative disorder last seen 09/23/2020 she is statin intolerance referred for PCSK9 therapy.  Compliance with diet, lifestyle and medications: Yes should be initiating work-up with this treatment  She recently had COVID-19 infection outpatient vaccinated did not receive Paxlovid She has recovered. No chest pain edema shortness of breath palpitation or syncope Past Medical History:  Diagnosis Date   Arthritis    Back pain    Blood dyscrasia     produces too many WBCs   Chronic neutrophilia 11/15/2012   Depression    Edema, lower extremity    Family history of heart disease    mother had stents placed in her 40s and had myocardial infarctions   Fibromyalgia    GERD (gastroesophageal reflux disease)    Glaucoma    Glaucoma    High cholesterol    Hypertension    IBS (irritable bowel syndrome)    Joint pain    Leukocytosis, unspecified 11/15/2012   Monocytosis 11/15/2012   Myeloproliferative disorder ([place]) 03/08/2013   Precordial chest pain    Pruritus 11/15/2012   Radial artery occlusion, right (HCC)    status post  right radial artery cardiac catheterization   Thyroid disease    Vaginal delivery 1986, 2000   Vitamin D deficiency     Past Surgical History:  Procedure Laterality Date   CARDIAC CATHETERIZATION N/A 10/03/2014   Procedure: Left Heart Cath and Coronary Angiography;  Surgeon: Lorretta Harp, MD;  Location: [facility];  Service: Cardiovascular;  Laterality: N/A;   DILITATION & CURRETTAGE/HYSTROSCOPY WITH NOVASURE ABLATION N/A 11/08/2014   Procedure: DILATATION & CURETTAGE/HYSTEROSCOPY WITH NOVASURE ABLATION;  Surgeon: Linda Hedges, DO;  Location: [facility];  Service: Gynecology;  Laterality: N/A;   EYE SURGERY     laser eye surgery   LAPAROSCOPIC VAGINAL HYSTERECTOMY WITH SALPINGECTOMY Bilateral 03/05/2015   Procedure: LAPAROSCOPIC ASSISTED VAGINAL HYSTERECTOMY WITH SALPINGECTOMY;  Surgeon: Linda Hedges, DO;  Location:  [facility];  Service: Gynecology;  Laterality: Bilateral;   TUBAL LIGATION  2000    Current Medications: Current Meds  Medication Sig   amLODipine (NORVASC) 10 MG tablet TAKE 1 TABLET BY MOUTH EVERY DAY   Ascorbic Acid (VITAMIN C) 1000 MG tablet Take by mouth daily.   aspirin 81 MG tablet Take 81 mg by mouth daily.   chlorhexidine (PERIDEX) 0.12 % solution Use as directed 5 mLs in the mouth or throat daily.   Cholecalciferol (VITAMIN D3 PO) Take by mouth daily.    Evolocumab (REPATHA  SURECLICK) 400 MG/ML SOAJ Inject 140 mg into the skin every 14 (fourteen) days.   furosemide (LASIX) 20 MG tablet TAKE 1 TABLET BY MOUTH EVERY DAY   KRILL OIL PO Take 1,200 mg by mouth daily.   levothyroxine (SYNTHROID) 50 MCG tablet Take 1 tablet (50 mcg total) by mouth daily before breakfast.   Magnesium 500 MG CAPS Take by mouth daily.    omeprazole (PRILOSEC) 20 MG capsule Take 1 capsule (20 mg total) by mouth daily.   zinc gluconate 50 MG tablet Take 50 mg by mouth daily.     Allergies:   Lisinopril, Losartan, Lubiprostone, and Pravachol [pravastatin sodium]   Social History   Socioeconomic History   Marital status: Divorced    Spouse name: Not on file   Number of children: Not on file   Years of education: Not on file   Highest education level: Not on file  Occupational History   Occupation: unemployed  Tobacco Use   Smoking status: Never   Smokeless tobacco: Never   Tobacco comments:    patient reports light tobacco use, has not smoked since friday (09/27/14)  Vaping Use   Vaping Use: Never used  Substance and Sexual Activity   Alcohol use: Yes    Alcohol/week: 6.0 standard drinks    Types: 6 Cans of beer per week   Drug use: No   Sexual activity: Not Currently    Partners: Male  Other Topics Concern   Not on file  Social History Narrative   Very little exercise   Social Determinants of Health   Financial Resource Strain: Low Risk    Difficulty of Paying Living Expenses: Not hard at all  Food Insecurity: No Food Insecurity   Worried About Charity fundraiser in the Last Year: Never true   [place] in the Last Year: Never true  Transportation Needs: No Transportation Needs   Lack of Transportation (Medical): No   Lack of Transportation (Non-Medical): No  Physical Activity: Inactive   Days of Exercise per Week: 0 days   Minutes of Exercise per Session: 0 min  Stress: No Stress Concern Present   Feeling of Stress : Only a little  Social Connections:  Moderately Isolated   Frequency of Communication with Friends and Family: More than three times a week   Frequency of Social Gatherings with Friends and Family: More than three times a week   Attends Religious Services: Never   Marine scientist or Organizations: No   Attends Music therapist: Never   Marital Status: Living with partner     Family History: The patient's family history includes Alcoholism in her father; Cancer in her father; Dementia in her maternal grandmother; Depression in her mother; Diabetes in her father; Heart disease in her cousin, maternal grandmother, and mother; Heart disease (age of onset: 36) in her maternal grandfather; Hyperlipidemia in her mother; Hypertension in her  mother; Obesity in her mother; Sleep apnea in her mother. There is no history of Other. ROS:   Please see the history of present illness.    All other systems reviewed and are negative.  EKGs/Labs/Other Studies Reviewed:    The following studies were reviewed today:   Recent Labs: 07/03/2020: TSH 0.54 09/25/2020: ALT 8; BUN 12; Creatinine 0.60; Hemoglobin 13.4; Platelet Count 279; Potassium 4.0; Sodium 137  Recent Lipid Panel    Component Value Date/Time   CHOL 199 07/03/2020 1020   TRIG 167.0 (H) 07/03/2020 1020   HDL 48.10 07/03/2020 1020   CHOLHDL 4 07/03/2020 1020   VLDL 33.4 07/03/2020 1020   LDLCALC 117 (H) 07/03/2020 1020   LDLDIRECT 167.0 01/31/2020 1121    Physical Exam:    VS:  BP 131/79 (BP Location: Left Arm, Patient Position: Sitting)   Pulse 62   Ht 5\' 6"  (1.676 m)   Wt 231 lb 6.4 oz (105 kg)   LMP  (LMP Unknown)   SpO2 97%   BMI 37.35 kg/m     Wt Readings from Last 3 Encounters:  12/24/20 231 lb 6.4 oz (105 kg)  12/10/20 229 lb (103.9 kg)  09/25/20 233 lb (105.7 kg)     GEN:  Well nourished, well developed in no acute distress HEENT: Normal NECK: No JVD; No carotid bruits LYMPHATICS: No lymphadenopathy CARDIAC: RRR, no murmurs, rubs,  gallops RESPIRATORY:  Clear to auscultation without rales, wheezing or rhonchi  ABDOMEN: Soft, non-tender, non-distended MUSCULOSKELETAL:  No edema; No deformity  SKIN: Warm and dry NEUROLOGIC:  Alert and oriented x 3 PSYCHIATRIC:  Normal affect    Signed, Shirlee More, MD  12/24/2020 2:12 PM     [facility]

## 2020-12-24 NOTE — Patient Instructions (Signed)
Medication Instructions:  Your physician has recommended you make the following change in your medication:  START: Repatha 140 mg one injection every 14 days.  *If you need a refill on your cardiac medications before your next appointment, please call your pharmacy*   Lab Work: Your physician recommends that you return for lab work in: 6 weeks Lipids If you have labs (blood work) drawn today and your tests are completely normal, you will receive your results only by: [place] (if you have MyChart) OR A paper copy in the mail If you have any lab test that is abnormal or we need to change your treatment, we will call you to review the results.   Testing/Procedures: None   Follow-Up: At [facility], you and your health needs are our priority.  As part of our continuing mission to provide you with exceptional heart care, we have created designated Provider Care Teams.  These Care Teams include your primary Cardiologist (physician) and Advanced Practice Providers (APPs -  Physician Assistants and Nurse Practitioners) who all work together to provide you with the care you need, when you need it.  We recommend signing up for the patient portal called "MyChart".  Sign up information is provided on this After Visit Summary.  MyChart is used to connect with patients for Virtual Visits (Telemedicine).  Patients are able to view lab/test results, encounter notes, upcoming appointments, etc.  Non-urgent messages can be sent to your provider as well.   To learn more about what you can do with MyChart, go to [email or web address].    Your next appointment:   1 year(s)  The format for your next appointment:   In Person  Provider:   Shirlee More, MD   Other Instructions

## 2020-12-25 ENCOUNTER — Ambulatory Visit: Payer: Medicare Other | Admitting: Hematology & Oncology

## 2020-12-25 ENCOUNTER — Other Ambulatory Visit: Payer: Medicare Other

## 2020-12-25 ENCOUNTER — Telehealth: Payer: Self-pay

## 2020-12-25 LAB — LIPID PANEL
Chol/HDL Ratio: 4.8 ratio — ABNORMAL HIGH (ref 0.0–4.4)
Cholesterol, Total: 214 mg/dL — ABNORMAL HIGH (ref 100–199)
HDL: 45 mg/dL (ref >39)
LDL Chol Calc (NIH): 123 mg/dL — ABNORMAL HIGH (ref 0–99)
Triglycerides: 264 mg/dL — ABNORMAL HIGH (ref 0–149)
VLDL Cholesterol Cal: 46 mg/dL — ABNORMAL HIGH (ref 5–40)

## 2020-12-25 NOTE — Telephone Encounter (Signed)
Left message on patients voicemail to please return our call.   

## 2020-12-25 NOTE — Telephone Encounter (Signed)
-----   Message from Richardo Priest, MD sent at 12/25/2020 10:41 AM EDT ----- Regarding: FW: Started repatha yesterday ----- Message ----- From: Lavone Neri Lab Results In Sent: 12/25/2020   5:38 AM EDT To: Richardo Priest, MD

## 2020-12-26 ENCOUNTER — Telehealth: Payer: Self-pay

## 2020-12-26 NOTE — Telephone Encounter (Signed)
Spoke with patient regarding results and recommendation.  Patient verbalizes understanding and is agreeable to plan of care. Advised patient to call back with any issues or concerns.  

## 2020-12-26 NOTE — Telephone Encounter (Signed)
-----   Message from Richardo Priest, MD sent at 12/25/2020 10:41 AM EDT ----- Regarding: FW: Started repatha yesterday ----- Message ----- From: Lavone Neri Lab Results In Sent: 12/25/2020   5:38 AM EDT To: Richardo Priest, MD

## 2021-01-09 DIAGNOSIS — R051 Acute cough: Secondary | ICD-10-CM | POA: Diagnosis not present

## 2021-01-09 DIAGNOSIS — J209 Acute bronchitis, unspecified: Secondary | ICD-10-CM | POA: Diagnosis not present

## 2021-01-14 ENCOUNTER — Telehealth: Payer: Self-pay | Admitting: Cardiology

## 2021-01-14 NOTE — Telephone Encounter (Signed)
Pt c/o medication issue:  1. Name of Medication: Evolocumab (REPATHA SURECLICK) 818 MG/ML SOAJ  2. How are you currently taking this medication (dosage and times per day)? One injection every two weeks  3. Are you having a reaction (difficulty breathing--STAT)? Cough (feels like mucus is in the back of her throat)  4. What is your medication issue? Patient spoke with her pharmacist and was told that her symptoms are one of the main side effects from this medication.   She is willing to take something to help lower her cholesterol but she can not have these symptoms that have affected her for the past 3 weeks

## 2021-01-15 NOTE — Telephone Encounter (Signed)
Left message on patients voicemail to please return our call.   

## 2021-01-15 NOTE — Telephone Encounter (Signed)
Spoke to the patient just now and let her know Dr. Joya Gaskins recommendations. She verbalizes understanding and states that she will call us back in 4 weeks to let us know how she is doing.    Encouraged patient to call back with any questions or concerns.

## 2021-02-01 ENCOUNTER — Other Ambulatory Visit: Payer: Self-pay | Admitting: Cardiology

## 2021-02-05 ENCOUNTER — Ambulatory Visit: Payer: Medicare Other | Admitting: Family Medicine

## 2021-02-06 ENCOUNTER — Other Ambulatory Visit: Payer: Self-pay | Admitting: Family Medicine

## 2021-02-06 DIAGNOSIS — I10 Essential (primary) hypertension: Secondary | ICD-10-CM

## 2021-02-06 DIAGNOSIS — K219 Gastro-esophageal reflux disease without esophagitis: Secondary | ICD-10-CM

## 2021-02-11 ENCOUNTER — Inpatient Hospital Stay (HOSPITAL_BASED_OUTPATIENT_CLINIC_OR_DEPARTMENT_OTHER): Payer: Medicare Other | Admitting: Hematology & Oncology

## 2021-02-11 ENCOUNTER — Other Ambulatory Visit (HOSPITAL_BASED_OUTPATIENT_CLINIC_OR_DEPARTMENT_OTHER): Payer: Self-pay | Admitting: Family Medicine

## 2021-02-11 ENCOUNTER — Telehealth: Payer: Self-pay | Admitting: *Deleted

## 2021-02-11 ENCOUNTER — Inpatient Hospital Stay: Payer: Medicare Other | Attending: Hematology & Oncology

## 2021-02-11 ENCOUNTER — Other Ambulatory Visit: Payer: Self-pay

## 2021-02-11 ENCOUNTER — Encounter: Payer: Self-pay | Admitting: Hematology & Oncology

## 2021-02-11 VITALS — BP 139/72 | HR 58 | Temp 97.8°F | Resp 18 | Wt 230.0 lb

## 2021-02-11 DIAGNOSIS — Z1239 Encounter for other screening for malignant neoplasm of breast: Secondary | ICD-10-CM

## 2021-02-11 DIAGNOSIS — E039 Hypothyroidism, unspecified: Secondary | ICD-10-CM | POA: Diagnosis not present

## 2021-02-11 DIAGNOSIS — D471 Chronic myeloproliferative disease: Secondary | ICD-10-CM | POA: Insufficient documentation

## 2021-02-11 LAB — CMP (CANCER CENTER ONLY)
ALT: 10 U/L (ref 0–44)
AST: 12 U/L — ABNORMAL LOW (ref 15–41)
Albumin: 4.6 g/dL (ref 3.5–5.0)
Alkaline Phosphatase: 110 U/L (ref 38–126)
Anion gap: 8 (ref 5–15)
BUN: 14 mg/dL (ref 6–20)
CO2: 28 mmol/L (ref 22–32)
Calcium: 11.2 mg/dL — ABNORMAL HIGH (ref 8.9–10.3)
Chloride: 102 mmol/L (ref 98–111)
Creatinine: 0.61 mg/dL (ref 0.44–1.00)
GFR, Estimated: >60 mL/min (ref >60)
Glucose, Bld: 94 mg/dL (ref 70–99)
Potassium: 4.3 mmol/L (ref 3.5–5.1)
Sodium: 138 mmol/L (ref 135–145)
Total Bilirubin: 0.6 mg/dL (ref 0.3–1.2)
Total Protein: 7.4 g/dL (ref 6.5–8.1)

## 2021-02-11 LAB — CBC WITH DIFFERENTIAL (CANCER CENTER ONLY)
Abs Immature Granulocytes: 0.28 10*3/uL — ABNORMAL HIGH (ref 0.00–0.07)
Basophils Absolute: 0.1 10*3/uL (ref 0.0–0.1)
Basophils Relative: 0 %
Eosinophils Absolute: 0.2 10*3/uL (ref 0.0–0.5)
Eosinophils Relative: 1 %
HCT: 44 % (ref 36.0–46.0)
Hemoglobin: 14 g/dL (ref 12.0–15.0)
Immature Granulocytes: 2 %
Lymphocytes Relative: 16 %
Lymphs Abs: 2.9 10*3/uL (ref 0.7–4.0)
MCH: 30.4 pg (ref 26.0–34.0)
MCHC: 31.8 g/dL (ref 30.0–36.0)
MCV: 95.4 fL (ref 80.0–100.0)
Monocytes Absolute: 1 10*3/uL (ref 0.1–1.0)
Monocytes Relative: 6 %
Neutro Abs: 13.6 10*3/uL — ABNORMAL HIGH (ref 1.7–7.7)
Neutrophils Relative %: 75 %
Platelet Count: 283 10*3/uL (ref 150–400)
RBC: 4.61 MIL/uL (ref 3.87–5.11)
RDW: 13 % (ref 11.5–15.5)
WBC Count: 18 10*3/uL — ABNORMAL HIGH (ref 4.0–10.5)
nRBC: 0 % (ref 0.0–0.2)

## 2021-02-11 LAB — SAVE SMEAR(SSMR), FOR PROVIDER SLIDE REVIEW

## 2021-02-11 LAB — LACTATE DEHYDROGENASE: LDH: 155 U/L (ref 98–192)

## 2021-02-11 NOTE — Progress Notes (Signed)
Hematology and Oncology Follow Up Visit  Brianna Fletcher [id number] September 03, 1970 50 y.o. 02/11/2021   Principle Diagnosis:  Chronic myeloproliferative syndrome-Triple negative Sub-clinical hypothyroidism (+) p-ANCA  Current Therapy:   Peg-Interferon q weekly - q 4 week dosing - On hold due to cost Synthroid 0.050 mg po q day   Interim History: [name] is here today for follow-up.  She is under quite a bit of stress.  She is taking care of her mom.  She is about putting her mother in a nursing home.  I certainly cannot blame her for doing this.  She has been taking care of her mom for about 3 years.  Her mom has progressive dementia and is becoming more difficult to take care of her.  Otherwise, she is doing fairly well.  She does have the autoimmune arthritis.  She does have myalgias and arthralgias.  She has had occasional bruising.  This may be from the aspirin that she is taking.  She has had a good appetite.  She has had no problems with nausea or vomiting.  There is been no change in bowel or bladder habits.  She has had no leg swelling.  Overall, she is doing well.  She did have COVID back in September.  Currently, her performance status is ECOG 1.      Medications:  Allergies as of 02/11/2021       Reactions   Lisinopril Cough   Losartan Cough   Lubiprostone Nausea And Vomiting   Pravachol [pravastatin Sodium] Other (See Comments)   "Causes pain in my body"        Medication List        Accurate as of February 11, 2021  8:25 AM. If you have any questions, ask your nurse or doctor.          amLODipine 10 MG tablet Commonly known as: NORVASC TAKE 1 TABLET BY MOUTH EVERY DAY   aspirin 81 MG tablet Take 81 mg by mouth daily.   chlorhexidine 0.12 % solution Commonly known as: PERIDEX Use as directed 5 mLs in the mouth or throat daily.   ezetimibe 10 MG tablet Commonly known as: ZETIA Take 1 tablet (10 mg total) by mouth daily.   furosemide 20 MG  tablet Commonly known as: LASIX TAKE 1 TABLET BY MOUTH EVERY DAY   KRILL OIL PO Take 1,200 mg by mouth daily.   levothyroxine 50 MCG tablet Commonly known as: SYNTHROID Take 1 tablet (50 mcg total) by mouth daily before breakfast.   Magnesium 500 MG Caps Take by mouth daily.   omeprazole 20 MG capsule Commonly known as: PRILOSEC TAKE 1 CAPSULE BY MOUTH EVERY DAY   Repatha SureClick 324 MG/ML Soaj Generic drug: Evolocumab INJECT 140 MG INTO THE SKIN EVERY 14 (FOURTEEN) DAYS.   vitamin C 1000 MG tablet Take by mouth daily.   VITAMIN D3 PO Take by mouth daily.   zinc gluconate 50 MG tablet Take 50 mg by mouth daily.        Allergies:  Allergies  Allergen Reactions   Lisinopril Cough   Losartan Cough   Lubiprostone Nausea And Vomiting   Pravachol [Pravastatin Sodium] Other (See Comments)    "Causes pain in my body"    Past Medical History, Surgical history, Social history, and Family History were reviewed and updated.  Review of Systems: Review of Systems  Constitutional:  Positive for malaise/fatigue.  HENT: Negative.    Eyes: Negative.   Respiratory: Negative.  Cardiovascular: Negative.   Gastrointestinal:  Positive for diarrhea.  Genitourinary: Negative.   Musculoskeletal:  Positive for myalgias.  Skin: Negative.   Neurological:  Positive for dizziness.  Psychiatric/Behavioral: Negative.      Physical Exam:  weight is 230 lb (104.3 kg). Her oral temperature is 97.8 F (36.6 C). Her pulse is 58 (abnormal). Her respiration is 18 and oxygen saturation is 100%.   Wt Readings from Last 3 Encounters:  02/11/21 230 lb (104.3 kg)  12/24/20 231 lb 6.4 oz (105 kg)  12/10/20 229 lb (103.9 kg)    Physical Exam Vitals reviewed.  HENT:     Head: Normocephalic and atraumatic.  Eyes:     Pupils: Pupils are equal, round, and reactive to light.  Cardiovascular:     Rate and Rhythm: Normal rate and regular rhythm.     Heart sounds: Normal heart sounds.   Pulmonary:     Effort: Pulmonary effort is normal.     Breath sounds: Normal breath sounds.  Abdominal:     General: Bowel sounds are normal.     Palpations: Abdomen is soft.  Musculoskeletal:        General: No tenderness or deformity. Normal range of motion.     Cervical back: Normal range of motion.  Lymphadenopathy:     Cervical: No cervical adenopathy.  Skin:    General: Skin is warm and dry.     Findings: No erythema or rash.  Neurological:     Mental Status: She is alert and oriented to person, place, and time.  Psychiatric:        Behavior: Behavior normal.        Thought Content: Thought content normal.        Judgment: Judgment normal.   Lab Results  Component Value Date   WBC 18.0 (H) 02/11/2021   HGB 14.0 02/11/2021   HCT 44.0 02/11/2021   MCV 95.4 02/11/2021   PLT 283 02/11/2021   Lab Results  Component Value Date   FERRITIN 64 06/06/2018   IRON 82 06/06/2018   TIBC 338 06/06/2018   UIBC 256 06/06/2018   IRONPCTSAT 24 06/06/2018   Lab Results  Component Value Date   RETICCTPCT 2.1 04/12/2017   RBC 4.61 02/11/2021   RETICCTABS 116.1 11/20/2013   No results found for: KPAFRELGTCHN, LAMBDASER, KAPLAMBRATIO No results found for: IGGSERUM, IGA, IGMSERUM No results found for: Kathrynn Ducking, MSPIKE, SPEI   Chemistry      Component Value Date/Time   NA 137 09/25/2020 0817   NA 137 08/27/2020 1459   NA 140 02/08/2017 0925   NA 136 03/10/2016 1147   K 4.0 09/25/2020 0817   K 3.4 02/08/2017 0925   K 3.9 03/10/2016 1147   CL 101 09/25/2020 0817   CL 105 02/08/2017 0925   CO2 28 09/25/2020 0817   CO2 26 02/08/2017 0925   CO2 24 03/10/2016 1147   BUN 12 09/25/2020 0817   BUN 11 08/27/2020 1459   BUN 10 02/08/2017 0925   BUN 10.2 03/10/2016 1147   CREATININE 0.60 09/25/2020 0817   CREATININE 0.7 02/08/2017 0925   CREATININE 0.7 03/10/2016 1147      Component Value Date/Time   CALCIUM 11.1 (H) 09/25/2020  0817   CALCIUM 10.4 (H) 02/08/2017 0925   CALCIUM 10.7 (H) 03/10/2016 1147   ALKPHOS 120 09/25/2020 0817   ALKPHOS 106 (H) 02/08/2017 0925   ALKPHOS 132 03/10/2016 1147   AST 11 (L) 09/25/2020  0817   AST 12 03/10/2016 1147   ALT 8 09/25/2020 0817   ALT 16 02/08/2017 0925   ALT 8 03/10/2016 1147   BILITOT 0.5 09/25/2020 0817   BILITOT 0.63 03/10/2016 1147      Impression and Plan: Ms. Abbasi is a very pleasant 50 yo caucasian female with a triple negative myeloproliferative syndrome though all work up has been negative so far.   I am happy that her white cell count is holding pretty steady.  Overall, I really think that her myeloproliferative issue is not progressing.  I just feel bad about her mom.  I know this is very tough on her to try to put her in a nursing home.  We will plan to get her through the holidays.  We will try to get her through most of wintertime and get her back in the spring.       [name], MD 11/23/20228:25 AM

## 2021-02-11 NOTE — Telephone Encounter (Signed)
Per 02/11/21 los - gave upcoming appointments - confirmed

## 2021-02-11 NOTE — Addendum Note (Signed)
Addended by: Melton Krebs on: 02/11/2021 10:37 AM   Modules accepted: Orders

## 2021-02-17 ENCOUNTER — Other Ambulatory Visit: Payer: Self-pay

## 2021-02-17 ENCOUNTER — Ambulatory Visit (INDEPENDENT_AMBULATORY_CARE_PROVIDER_SITE_OTHER): Payer: Medicare Other | Admitting: Family Medicine

## 2021-02-17 ENCOUNTER — Encounter: Payer: Self-pay | Admitting: Family Medicine

## 2021-02-17 VITALS — BP 118/80 | HR 60 | Temp 97.8°F | Resp 18 | Ht 66.0 in | Wt 228.0 lb

## 2021-02-17 DIAGNOSIS — D471 Chronic myeloproliferative disease: Secondary | ICD-10-CM

## 2021-02-17 DIAGNOSIS — Z23 Encounter for immunization: Secondary | ICD-10-CM | POA: Diagnosis not present

## 2021-02-17 DIAGNOSIS — I10 Essential (primary) hypertension: Secondary | ICD-10-CM | POA: Diagnosis not present

## 2021-02-17 DIAGNOSIS — E538 Deficiency of other specified B group vitamins: Secondary | ICD-10-CM

## 2021-02-17 DIAGNOSIS — I209 Angina pectoris, unspecified: Secondary | ICD-10-CM | POA: Diagnosis not present

## 2021-02-17 DIAGNOSIS — I742 Embolism and thrombosis of arteries of the upper extremities: Secondary | ICD-10-CM | POA: Insufficient documentation

## 2021-02-17 DIAGNOSIS — E785 Hyperlipidemia, unspecified: Secondary | ICD-10-CM

## 2021-02-17 DIAGNOSIS — E559 Vitamin D deficiency, unspecified: Secondary | ICD-10-CM

## 2021-02-17 DIAGNOSIS — Z1211 Encounter for screening for malignant neoplasm of colon: Secondary | ICD-10-CM | POA: Diagnosis not present

## 2021-02-17 DIAGNOSIS — Z1159 Encounter for screening for other viral diseases: Secondary | ICD-10-CM

## 2021-02-17 DIAGNOSIS — I70208 Unspecified atherosclerosis of native arteries of extremities, other extremity: Secondary | ICD-10-CM | POA: Diagnosis not present

## 2021-02-17 DIAGNOSIS — E213 Hyperparathyroidism, unspecified: Secondary | ICD-10-CM

## 2021-02-17 DIAGNOSIS — E039 Hypothyroidism, unspecified: Secondary | ICD-10-CM | POA: Diagnosis not present

## 2021-02-17 LAB — COMPREHENSIVE METABOLIC PANEL
ALT: 9 U/L (ref 0–35)
AST: 12 U/L (ref 0–37)
Albumin: 4.6 g/dL (ref 3.5–5.2)
Alkaline Phosphatase: 108 U/L (ref 39–117)
BUN: 11 mg/dL (ref 6–23)
CO2: 28 mEq/L (ref 19–32)
Calcium: 11 mg/dL — ABNORMAL HIGH (ref 8.4–10.5)
Chloride: 100 mEq/L (ref 96–112)
Creatinine, Ser: 0.59 mg/dL (ref 0.40–1.20)
GFR: 103.27 mL/min (ref >60.00)
Glucose, Bld: 87 mg/dL (ref 70–99)
Potassium: 4.1 mEq/L (ref 3.5–5.1)
Sodium: 138 mEq/L (ref 135–145)
Total Bilirubin: 0.6 mg/dL (ref 0.2–1.2)
Total Protein: 7.3 g/dL (ref 6.0–8.3)

## 2021-02-17 LAB — LIPID PANEL
Cholesterol: 178 mg/dL (ref 0–200)
HDL: 54.6 mg/dL (ref >39.00)
LDL Cholesterol: 101 mg/dL — ABNORMAL HIGH (ref 0–99)
NonHDL: 123.41
Total CHOL/HDL Ratio: 3
Triglycerides: 113 mg/dL (ref 0.0–149.0)
VLDL: 22.6 mg/dL (ref 0.0–40.0)

## 2021-02-17 LAB — TSH: TSH: 0.78 u[IU]/mL (ref 0.35–5.50)

## 2021-02-17 NOTE — Assessment & Plan Note (Signed)
Per hematology 

## 2021-02-17 NOTE — Assessment & Plan Note (Signed)
Per endo °

## 2021-02-17 NOTE — Assessment & Plan Note (Signed)
Check labs 

## 2021-02-17 NOTE — Assessment & Plan Note (Signed)
resolved 

## 2021-02-17 NOTE — Assessment & Plan Note (Signed)
Check today 

## 2021-02-17 NOTE — Patient Instructions (Signed)
Thyroid-Stimulating Hormone Test Why am I having this test? The thyroid is a gland in the lower front of the neck. It makes hormones that affect many body parts and systems, including the system that affects how quickly the body burns fuel for energy (metabolism). The pituitary gland is located just below the brain, behind the eyes and nasal passages. It helps maintain thyroid hormone levels and thyroid gland function. You may have a thyroid-stimulating hormone (TSH) test if you have possible symptoms of abnormal thyroid hormone levels. This test can help your health care provider: Diagnose a disorder of the thyroid gland or pituitary gland. Manage your condition and treatment if you have an underactive thyroid (hypothyroidism) or an overactive thyroid (hyperthyroidism). Newborn babies may have this test done to screen for hypothyroidism that is present at birth (congenital). What is being tested? This test measures the amount of TSH in your blood. TSH may also be called thyrotropin. When the thyroid does not make enough hormones, the pituitary gland releases TSH into the bloodstream to stimulate the thyroid gland to make more hormones. What kind of sample is taken?   A blood sample is required for this test. It is usually collected by inserting a needle into a blood vessel. For newborns, a small amount of blood may be collected from the umbilical cord, or by using a small needle to prick the baby's heel (heel stick). Tell a health care provider about: All medicines you are taking, including vitamins, herbs, eye drops, creams, and over-the-counter medicines. Any blood disorders you have. Any surgeries you have had. Any medical conditions you have. Whether you are pregnant or may be pregnant. How are the results reported? Your test results will be reported as a value that indicates how much TSH is in your blood. Your health care provider will compare your results to normal ranges that were  established after testing a large group of people (reference ranges). Reference ranges may vary among labs and hospitals. For this test, common reference ranges are: Adult: 2-10 microunits/mL or 2-10 milliunits/L. Newborn: Heel stick: 3-18 microunits/mL or 3-18 milliunits/L. Umbilical cord: 3-12 microunits/mL or 3-12 milliunits/L. What do the results mean? Results that are within the reference range are considered normal. This means that you have a normal amount of TSH in your blood. Results that are higher than the reference range mean that your TSH levels are too high. This may mean: Your thyroid gland is not making enough thyroid hormones. Your thyroid medicine dosage is too low. You have a tumor on your pituitary gland. This is rare. Results that are lower than the reference range mean that your TSH levels are too low. This may be caused by hyperthyroidism or by a problem with the pituitary gland function. Talk with your health care provider about what your results mean. Questions to ask your health care provider Ask your health care provider, or the department that is doing the test: When will my results be ready? How will I get my results? What are my treatment options? What other tests do I need? What are my next steps? Summary You may have a thyroid-stimulating hormone (TSH) test if you have possible symptoms of abnormal thyroid hormone levels. The thyroid is a gland in the lower front of the neck. It makes hormones that affect many body parts and systems. The pituitary gland is located just below the brain, behind the eyes and nasal passages. It helps maintain thyroid hormone levels and thyroid gland function. This test measures the   amount of TSH in your blood. TSH is made by the pituitary gland. It may also be called thyrotropin. This information is not intended to replace advice given to you by your health care provider. Make sure you discuss any questions you have with your  health care provider. Document Revised: 11/13/2020 Document Reviewed: 11/22/2019 Elsevier Patient Education  2022 [employer].

## 2021-02-17 NOTE — Progress Notes (Signed)
Established Patient Office Visit  Subjective:  Patient ID: Brianna Fletcher, female    DOB: 07-02-1970  Age: 50 y.o. MRN: [id number]  CC:  Chief Complaint  Patient presents with   Hypothyroidism   Follow-up    HPI XOE HOE presents for f/u thyroid.  She is under a lot of stress due to her mothers placement in snf.   Pt is doing ok .    Past Medical History:  Diagnosis Date   Arthritis    Back pain    Blood dyscrasia    produces too many WBCs   Chronic neutrophilia 11/15/2012   Depression    Edema, lower extremity    Family history of heart disease    mother had stents placed in her 49s and had myocardial infarctions   Fibromyalgia    GERD (gastroesophageal reflux disease)    Glaucoma    Glaucoma    High cholesterol    Hypertension    IBS (irritable bowel syndrome)    Joint pain    Leukocytosis, unspecified 11/15/2012   Monocytosis 11/15/2012   Myeloproliferative disorder ([place]) 03/08/2013   Precordial chest pain    Pruritus 11/15/2012   Radial artery occlusion, right (HCC)    status post  right radial artery cardiac catheterization   Thyroid disease    Vaginal delivery 1986, 2000   Vitamin D deficiency     Past Surgical History:  Procedure Laterality Date   CARDIAC CATHETERIZATION N/A 10/03/2014   Procedure: Left Heart Cath and Coronary Angiography;  Surgeon: Lorretta Harp, MD;  Location: [facility];  Service: Cardiovascular;  Laterality: N/A;   DILITATION & CURRETTAGE/HYSTROSCOPY WITH NOVASURE ABLATION N/A 11/08/2014   Procedure: DILATATION & CURETTAGE/HYSTEROSCOPY WITH NOVASURE ABLATION;  Surgeon: Linda Hedges, DO;  Location: [facility];  Service: Gynecology;  Laterality: N/A;   EYE SURGERY     laser eye surgery   LAPAROSCOPIC VAGINAL HYSTERECTOMY WITH SALPINGECTOMY Bilateral 03/05/2015   Procedure: LAPAROSCOPIC ASSISTED VAGINAL HYSTERECTOMY WITH SALPINGECTOMY;  Surgeon: Linda Hedges, DO;  Location: [facility];  Service: Gynecology;  Laterality: Bilateral;    TUBAL LIGATION  2000    Family History  Problem Relation Age of Onset   Heart disease Mother    Hypertension Mother    Hyperlipidemia Mother    Depression Mother    Sleep apnea Mother    Obesity Mother    Diabetes Father    Cancer Father        esopageal ?   Alcoholism Father    Dementia Maternal Grandmother    Heart disease Maternal Grandmother    Heart disease Maternal Grandfather 11       MI   Heart disease Cousin    Other Neg Hx        hyperparathyroidism    Social History   Socioeconomic History   Marital status: Divorced    Spouse name: Not on file   Number of children: Not on file   Years of education: Not on file   Highest education level: Not on file  Occupational History   Occupation: unemployed  Tobacco Use   Smoking status: Never   Smokeless tobacco: Never   Tobacco comments:    patient reports light tobacco use, has not smoked since friday (09/27/14)  Vaping Use   Vaping Use: Never used  Substance and Sexual Activity   Alcohol use: Yes    Alcohol/week: 6.0 standard drinks    Types: 6 Cans of beer per week  Drug use: No   Sexual activity: Not Currently    Partners: Male  Other Topics Concern   Not on file  Social History Narrative   Very little exercise   Social Determinants of Health   Financial Resource Strain: Low Risk    Difficulty of Paying Living Expenses: Not hard at all  Food Insecurity: No Food Insecurity   Worried About Charity fundraiser in the Last Year: Never true   [place] in the Last Year: Never true  Transportation Needs: No Transportation Needs   Lack of Transportation (Medical): No   Lack of Transportation (Non-Medical): No  Physical Activity: Inactive   Days of Exercise per Week: 0 days   Minutes of Exercise per Session: 0 min  Stress: No Stress Concern Present   Feeling of Stress : Only a little  Social Connections: Moderately Isolated   Frequency of Communication with Friends and Family: More than three  times a week   Frequency of Social Gatherings with Friends and Family: More than three times a week   Attends Religious Services: Never   Marine scientist or Organizations: No   Attends Music therapist: Never   Marital Status: Living with partner  Intimate Partner Violence: Not At Risk   Fear of Current or Ex-Partner: No   Emotionally Abused: No   Physically Abused: No   Sexually Abused: No    Outpatient Medications Prior to Visit  Medication Sig Dispense Refill   amLODipine (NORVASC) 10 MG tablet TAKE 1 TABLET BY MOUTH EVERY DAY 90 tablet 1   Ascorbic Acid (VITAMIN C) 1000 MG tablet Take by mouth daily.     aspirin 81 MG tablet Take 81 mg by mouth daily.     chlorhexidine (PERIDEX) 0.12 % solution Use as directed 5 mLs in the mouth or throat daily.     Cholecalciferol (VITAMIN D3 PO) Take by mouth daily.      furosemide (LASIX) 20 MG tablet TAKE 1 TABLET BY MOUTH EVERY DAY 90 tablet 0   KRILL OIL PO Take 1,200 mg by mouth daily.     levothyroxine (SYNTHROID) 50 MCG tablet Take 1 tablet (50 mcg total) by mouth daily before breakfast. 90 tablet 0   Magnesium 500 MG CAPS Take by mouth daily.      melatonin 5 MG TABS Take 5 mg by mouth at bedtime as needed.     omeprazole (PRILOSEC) 20 MG capsule TAKE 1 CAPSULE BY MOUTH EVERY DAY 90 capsule 1   zinc gluconate 50 MG tablet Take 50 mg by mouth daily.     ezetimibe (ZETIA) 10 MG tablet Take 1 tablet (10 mg total) by mouth daily. 90 tablet 3   Facility-Administered Medications Prior to Visit  Medication Dose Route Frequency Provider Last Rate Last Admin   acetaminophen (TYLENOL) tablet 650 mg  650 mg Oral Once [name], MD       peginterferon alfa-2a (PEGASYS) injection 81 mcg  81 mcg Subcutaneous Weekly [name], MD   81 mcg at 04/26/14 1530    Allergies  Allergen Reactions   Lisinopril Cough   Losartan Cough   Lubiprostone Nausea And Vomiting   Pravachol [Pravastatin Sodium] Other (See Comments)     "Causes pain in my body"   Repatha [Evolocumab] Cough    Severe cough that would not respond to any treatment - per patient.    ROS Review of Systems  Constitutional:  Negative for appetite change,  diaphoresis, fatigue and unexpected weight change.  Eyes:  Negative for pain, redness and visual disturbance.  Respiratory:  Negative for cough, chest tightness, shortness of breath and wheezing.   Cardiovascular:  Negative for chest pain, palpitations and leg swelling.  Endocrine: Negative for cold intolerance, heat intolerance, polydipsia, polyphagia and polyuria.  Genitourinary:  Negative for difficulty urinating, dysuria and frequency.  Neurological:  Negative for dizziness, light-headedness, numbness and headaches.     Objective:    Physical Exam Vitals and nursing note reviewed.  Constitutional:      Appearance: She is well-developed.  HENT:     Head: Normocephalic and atraumatic.  Eyes:     Conjunctiva/sclera: Conjunctivae normal.  Neck:     Thyroid: No thyromegaly.     Vascular: No carotid bruit or JVD.  Cardiovascular:     Rate and Rhythm: Normal rate and regular rhythm.     Heart sounds: Normal heart sounds. No murmur heard. Pulmonary:     Effort: Pulmonary effort is normal. No respiratory distress.     Breath sounds: Normal breath sounds. No wheezing or rales.  Chest:     Chest wall: No tenderness.  Musculoskeletal:     Cervical back: Normal range of motion and neck supple.  Neurological:     Mental Status: She is alert and oriented to person, place, and time.  Psychiatric:        Mood and Affect: Mood normal.        Behavior: Behavior normal.        Thought Content: Thought content normal.        Judgment: Judgment normal.    BP 118/80 (BP Location: Left Arm, Patient Position: Sitting, Cuff Size: Large)   Pulse 60   Temp 97.8 F (36.6 C) (Oral)   Resp 18   Ht '5\' 6"'  (1.676 m)   Wt 228 lb (103.4 kg)   LMP  (LMP Unknown)   SpO2 98%   BMI 36.80 kg/m   Wt Readings from Last 3 Encounters:  02/17/21 228 lb (103.4 kg)  02/11/21 230 lb (104.3 kg)  12/24/20 231 lb 6.4 oz (105 kg)     Health Maintenance Due  Topic Date Due   Pneumococcal Vaccine 22-61 Years old (1 - PCV) Never done   Hepatitis C Screening  Never done   Zoster Vaccines- Shingrix (1 of 2) Never done   MAMMOGRAM  11/09/2018   COLONOSCOPY (Pts 45-13yr Insurance coverage will need to be confirmed)  02/11/2020   COVID-19 Vaccine (4 - Booster for Pfizer series) 03/05/2020   INFLUENZA VACCINE  10/20/2020    There are no preventive care reminders to display for this patient.  Lab Results  Component Value Date   TSH 0.54 07/03/2020   Lab Results  Component Value Date   WBC 18.0 (H) 02/11/2021   HGB 14.0 02/11/2021   HCT 44.0 02/11/2021   MCV 95.4 02/11/2021   PLT 283 02/11/2021   Lab Results  Component Value Date   NA 138 02/11/2021   K 4.3 02/11/2021   CHLORIDE 102 03/10/2016   CO2 28 02/11/2021   GLUCOSE 94 02/11/2021   BUN 14 02/11/2021   CREATININE 0.61 02/11/2021   BILITOT 0.6 02/11/2021   ALKPHOS 110 02/11/2021   AST 12 (L) 02/11/2021   ALT 10 02/11/2021   PROT 7.4 02/11/2021   ALBUMIN 4.6 02/11/2021   CALCIUM 11.2 (H) 02/11/2021   ANIONGAP 8 02/11/2021   EGFR 105 08/27/2020   GFR 103.53 12/08/2018  Lab Results  Component Value Date   CHOL 214 (H) 12/24/2020   Lab Results  Component Value Date   HDL 45 12/24/2020   Lab Results  Component Value Date   LDLCALC 123 (H) 12/24/2020   Lab Results  Component Value Date   TRIG 264 (H) 12/24/2020   Lab Results  Component Value Date   CHOLHDL 4.8 (H) 12/24/2020   No results found for: HGBA1C    Assessment & Plan:   Problem List Items Addressed This Visit       Unprioritized   Myeloproliferative disorder ([place]) (Chronic)    Per hematology      Colon cancer screening   Relevant Orders   Ambulatory referral to Gastroenterology   Need for hepatitis C screening test   Relevant  Orders   Hepatitis C antibody   Embolism and thrombosis of arteries of the upper extremities ([place])   Essential hypertension    Well controlled, no changes to meds. Encouraged heart healthy diet such as the DASH diet and exercise as tolerated.       Hyperlipidemia    Encourage heart healthy diet such as MIND or DASH diet, increase exercise, avoid trans fats, simple carbohydrates and processed foods, consider a krill or fish or flaxseed oil cap daily.       Relevant Orders   Comprehensive metabolic panel   Lipid panel   Hyperparathyroidism ([place])    Per endo      Hypothyroidism - Primary    Check labs      Relevant Orders   TSH   Ischemic chest pain ([place])    resolved      Radial artery occlusion, right ([place])    resolved      Vitamin D deficiency    Check today      Relevant Orders   VITAMIN D 25 Hydroxy (Vit-D Deficiency, Fractures)   Other Visit Diagnoses     Need for influenza vaccination       Relevant Orders   Flu Vaccine QUAD 78moIM (Fluarix, Fluzone & Alfiuria Quad PF)   Vitamin B12 deficiency       Relevant Orders   Vitamin B12       No orders of the defined types were placed in this encounter.   Follow-up: Return in about 6 months (around 08/17/2021), or if symptoms worsen or fail to improve, for annual exam, fasting.    YAnn Held DO

## 2021-02-17 NOTE — Assessment & Plan Note (Signed)
Well controlled, no changes to meds. Encouraged heart healthy diet such as the DASH diet and exercise as tolerated.  °

## 2021-02-17 NOTE — Assessment & Plan Note (Signed)
Encourage heart healthy diet such as MIND or DASH diet, increase exercise, avoid trans fats, simple carbohydrates and processed foods, consider a krill or fish or flaxseed oil cap daily.  °

## 2021-02-18 LAB — HEPATITIS C ANTIBODY
Hepatitis C Ab: NONREACTIVE
SIGNAL TO CUT-OFF: <0.02 (ref <1.00)

## 2021-02-24 ENCOUNTER — Telehealth: Payer: Self-pay | Admitting: Gastroenterology

## 2021-02-24 NOTE — Telephone Encounter (Signed)
Hi Dr. Lyndel Safe,  This patient would like transfer her care to you. Her boyfriend is also a patient of yours and she was very impressed with you.  Her records are in Epic for your review.  It appears her last colon/egd was done around November of 2016.  Please let me know if you approve the transfer.  Thank you.

## 2021-02-26 ENCOUNTER — Telehealth: Payer: Self-pay | Admitting: Family Medicine

## 2021-02-26 DIAGNOSIS — L299 Pruritus, unspecified: Secondary | ICD-10-CM | POA: Diagnosis not present

## 2021-02-26 DIAGNOSIS — L209 Atopic dermatitis, unspecified: Secondary | ICD-10-CM | POA: Diagnosis not present

## 2021-02-26 NOTE — Telephone Encounter (Signed)
Nurse Assessment Nurse: Adrian Blackwater, RN, Claiborne Billings Date/Time Eilene Ghazi Time): 02/26/2021 2:35:28 PM Confirm and document reason for call. If symptomatic, describe symptoms. ---Caller states she has been taking BP meds for years. 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 For the past 2 weeks she has been having HAs, and her BP has been running higher than normal- 137/80, 062 and 694 systolic. She does not have a HA currently. Does the patient have any new or worsening symptoms? ---Yes Will a triage be completed? ---Yes Related visit to physician within the last 2 weeks? ---No Does the PT have any chronic conditions? (i.e. diabetes, asthma, this includes High risk factors for pregnancy, etc.) ---Yes List chronic conditions. ---HTN, Leukocytosis Hx/myeloproliferative disorder Is the patient pregnant or possibly pregnant? (Ask all females between the ages of 35-55) ---No Is this a behavioral health or substance abuse call? ---No Guidelines Guideline Title Affirmed Question Affirmed Notes Nurse Date/Time (Eastern Time) Blood Pressure - High Systolic BP >= 854 OR Diastolic >= 627 Gigi Gin 02/26/2021 2:43:43 PM PLEASE NOTE: All timestamps contained within this report are represented as [place] Standard Time. CONFIDENTIALTY NOTICE: This fax transmission is intended only for the addressee. It contains information that is legally privileged, confidential or otherwise protected from use or disclosure. If you are not the intended recipient, you are strictly prohibited from reviewing, disclosing, copying using or disseminating any of this information or taking any action in reliance on or regarding this information. If you have received this fax in error, please notify us immediately by telephone so that we can arrange for its return to [place]. Phone: [phone], Toll-Free: [phone], Fax: [phone] Page: 2 of 2 Call Id: [id number] [place].  Time Eilene Ghazi Time) Disposition Final User 02/26/2021 2:48:13 PM SEE PCP WITHIN 3 DAYS Yes Adrian Blackwater, RN, Maxie Barb Disagree/Comply Comply Caller Understands Yes PreDisposition Call Doctor Care Advice Given Per Guideline SEE PCP WITHIN 3 DAYS: * You need to be seen within 2 or 3 days. HIGH BLOOD PRESSURE: * Untreated high blood pressure may cause damage to your heart, brain, kidneys, and eyes. * EAT HEALTHY: Eat a diet rich in fresh fruits and vegetables, dietary fiber, non-animal protein (e.g., soy), and low-fat dairy products. Avoid foods with a high content of saturated fat or cholesterol. * DECREASE SODIUM INTAKE: Aim to eat less than 2.4 g (100 mmol) of sodium each day. Unfortunately 75% of the salt in the average person's diet is in pre-processed foods. * LIMIT ALCOHOL: Limit alcoholic beverages to no more than one drink per day for women and no more than two drinks for men. A drink is 1.5 oz hard liquor (one shot or jigger; 45 ml), 5 oz wine (small glass; 150 ml), 12 oz beer (one can; 360 ml). CALL BACK IF: * Chest pain or difficulty breathing occurs * Your blood pressure is over 180/110 * You become worse Referrals REFERRED TO PCP OFFICE

## 2021-02-26 NOTE — Telephone Encounter (Signed)
Patient called stating she has been getting headaches and believes it is her blood pressure. She stated that she has been checking it and her reading have been 137/80, 170/80, 155/80. Patient states she has a lot going on at home and it's not sure if its actual heart problems or stress related. She would like advice on what to do. Patient was triaged to nurse to make sure her bp readings did not need immediate attention.

## 2021-02-26 NOTE — Telephone Encounter (Signed)
Pt needs office visit please 

## 2021-02-26 NOTE — Telephone Encounter (Signed)
No problems by me Can make appointment with me or app clinic RG

## 2021-02-26 NOTE — Telephone Encounter (Signed)
Pt seen last month. Pt is taking Amlodipine 10 MG once daily. Please advise

## 2021-03-05 ENCOUNTER — Other Ambulatory Visit: Payer: Self-pay | Admitting: Family Medicine

## 2021-03-05 ENCOUNTER — Encounter: Payer: Self-pay | Admitting: Family Medicine

## 2021-03-05 ENCOUNTER — Ambulatory Visit (INDEPENDENT_AMBULATORY_CARE_PROVIDER_SITE_OTHER): Payer: Medicare Other | Admitting: Family Medicine

## 2021-03-05 VITALS — BP 110/80 | HR 62 | Temp 97.7°F | Resp 18 | Ht 66.0 in | Wt 227.8 lb

## 2021-03-05 DIAGNOSIS — I10 Essential (primary) hypertension: Secondary | ICD-10-CM | POA: Diagnosis not present

## 2021-03-05 DIAGNOSIS — D471 Chronic myeloproliferative disease: Secondary | ICD-10-CM

## 2021-03-05 DIAGNOSIS — R519 Headache, unspecified: Secondary | ICD-10-CM | POA: Diagnosis not present

## 2021-03-05 DIAGNOSIS — R600 Localized edema: Secondary | ICD-10-CM

## 2021-03-05 DIAGNOSIS — E039 Hypothyroidism, unspecified: Secondary | ICD-10-CM

## 2021-03-05 DIAGNOSIS — E041 Nontoxic single thyroid nodule: Secondary | ICD-10-CM

## 2021-03-05 NOTE — Progress Notes (Signed)
Subjective:   By signing my name below, I, Zite Okoli, attest that this documentation has been prepared under the direction and in the presence of Ann Held, DO. 03/05/2021   Patient ID: Brianna Fletcher, female    DOB: 08/27/1970, 50 y.o.   MRN: [id number]  Chief Complaint  Patient presents with   Hypertension    Pt states blood pressures at home between 137/75 to 167/80. Pt states having headaches. No dizziness, blurred vision, chest pain or palps.     HPI Patient is in today for an office visit.  She reports that 2 days before her last visit, her blood pressure was high. At the visit, it was stable. Since the last visit, she has been getting high blood pressure readings using different cuffs. Systolic has ranged from 322-025, diastolic has remained below 90. She reports that with the readings, she has been having sharp, stabbing headaches. Mentions she does not have a history of headaches and has not been using any medication. They used to occur when she was stressed but now, they are more frequent especially at night especially for the past 2 weeks. Denies chest pain, vision changes or shortness of breath. She is also very stressed due to her mother's illness. She adds that the pain is not localized to any part of her head.   Blood pressure at start of visit is 110/80. During the visit, 140/78 After this visit, 132/80   Past Medical History:  Diagnosis Date   Arthritis    Back pain    Blood dyscrasia    produces too many WBCs   Chronic neutrophilia 11/15/2012   Depression    Edema, lower extremity    Family history of heart disease    mother had stents placed in her 55s and had myocardial infarctions   Fibromyalgia    GERD (gastroesophageal reflux disease)    Glaucoma    Glaucoma    High cholesterol    Hypertension    IBS (irritable bowel syndrome)    Joint pain    Leukocytosis, unspecified 11/15/2012   Monocytosis 11/15/2012   Myeloproliferative disorder ([place])  03/08/2013   Precordial chest pain    Pruritus 11/15/2012   Radial artery occlusion, right (HCC)    status post  right radial artery cardiac catheterization   Thyroid disease    Vaginal delivery 1986, 2000   Vitamin D deficiency     Past Surgical History:  Procedure Laterality Date   CARDIAC CATHETERIZATION N/A 10/03/2014   Procedure: Left Heart Cath and Coronary Angiography;  Surgeon: Lorretta Harp, MD;  Location: [facility];  Service: Cardiovascular;  Laterality: N/A;   DILITATION & CURRETTAGE/HYSTROSCOPY WITH NOVASURE ABLATION N/A 11/08/2014   Procedure: DILATATION & CURETTAGE/HYSTEROSCOPY WITH NOVASURE ABLATION;  Surgeon: Linda Hedges, DO;  Location: [facility];  Service: Gynecology;  Laterality: N/A;   EYE SURGERY     laser eye surgery   LAPAROSCOPIC VAGINAL HYSTERECTOMY WITH SALPINGECTOMY Bilateral 03/05/2015   Procedure: LAPAROSCOPIC ASSISTED VAGINAL HYSTERECTOMY WITH SALPINGECTOMY;  Surgeon: Linda Hedges, DO;  Location: [facility];  Service: Gynecology;  Laterality: Bilateral;   TUBAL LIGATION  2000    Family History  Problem Relation Age of Onset   Heart disease Mother    Hypertension Mother    Hyperlipidemia Mother    Depression Mother    Sleep apnea Mother    Obesity Mother    Diabetes Father    Cancer Father  esopageal ?   Alcoholism Father    Dementia Maternal Grandmother    Heart disease Maternal Grandmother    Heart disease Maternal Grandfather 76       MI   Heart disease Cousin    Other Neg Hx        hyperparathyroidism    Social History   Socioeconomic History   Marital status: Divorced    Spouse name: Not on file   Number of children: Not on file   Years of education: Not on file   Highest education level: Not on file  Occupational History   Occupation: unemployed  Tobacco Use   Smoking status: Never   Smokeless tobacco: Never   Tobacco comments:    patient reports light tobacco use, has not smoked since friday (09/27/14)  Vaping Use    Vaping Use: Never used  Substance and Sexual Activity   Alcohol use: Yes    Alcohol/week: 6.0 standard drinks    Types: 6 Cans of beer per week   Drug use: No   Sexual activity: Not Currently    Partners: Male  Other Topics Concern   Not on file  Social History Narrative   Very little exercise   Social Determinants of Health   Financial Resource Strain: Low Risk    Difficulty of Paying Living Expenses: Not hard at all  Food Insecurity: No Food Insecurity   Worried About Charity fundraiser in the Last Year: Never true   [place] in the Last Year: Never true  Transportation Needs: No Transportation Needs   Lack of Transportation (Medical): No   Lack of Transportation (Non-Medical): No  Physical Activity: Inactive   Days of Exercise per Week: 0 days   Minutes of Exercise per Session: 0 min  Stress: No Stress Concern Present   Feeling of Stress : Only a little  Social Connections: Moderately Isolated   Frequency of Communication with Friends and Family: More than three times a week   Frequency of Social Gatherings with Friends and Family: More than three times a week   Attends Religious Services: Never   Marine scientist or Organizations: No   Attends Music therapist: Never   Marital Status: Living with partner  Intimate Partner Violence: Not At Risk   Fear of Current or Ex-Partner: No   Emotionally Abused: No   Physically Abused: No   Sexually Abused: No    Outpatient Medications Prior to Visit  Medication Sig Dispense Refill   amLODipine (NORVASC) 10 MG tablet TAKE 1 TABLET BY MOUTH EVERY DAY 90 tablet 1   Ascorbic Acid (VITAMIN C) 1000 MG tablet Take by mouth daily.     aspirin 81 MG tablet Take 81 mg by mouth daily.     chlorhexidine (PERIDEX) 0.12 % solution Use as directed 5 mLs in the mouth or throat daily.     Cholecalciferol (VITAMIN D3 PO) Take by mouth daily.      furosemide (LASIX) 20 MG tablet TAKE 1 TABLET BY MOUTH EVERY DAY 90  tablet 1   KRILL OIL PO Take 1,200 mg by mouth daily.     levothyroxine (SYNTHROID) 50 MCG tablet TAKE 1 TABLET BY MOUTH DAILY BEFORE BREAKFAST 90 tablet 1   Magnesium 500 MG CAPS Take by mouth daily.      melatonin 5 MG TABS Take 5 mg by mouth at bedtime as needed.     omeprazole (PRILOSEC) 20 MG capsule TAKE 1 CAPSULE BY  MOUTH EVERY DAY 90 capsule 1   zinc gluconate 50 MG tablet Take 50 mg by mouth daily.     ezetimibe (ZETIA) 10 MG tablet Take 1 tablet (10 mg total) by mouth daily. 90 tablet 3   Facility-Administered Medications Prior to Visit  Medication Dose Route Frequency Provider Last Rate Last Admin   acetaminophen (TYLENOL) tablet 650 mg  650 mg Oral Once [name], MD       peginterferon alfa-2a (PEGASYS) injection 81 mcg  81 mcg Subcutaneous Weekly [name], MD   81 mcg at 04/26/14 1530    Allergies  Allergen Reactions   Lisinopril Cough   Losartan Cough   Lubiprostone Nausea And Vomiting   Pravachol [Pravastatin Sodium] Other (See Comments)    "Causes pain in my body"   Repatha [Evolocumab] Cough    Severe cough that would not respond to any treatment - per patient.    Review of Systems  Constitutional:  Negative for fever.  HENT:  Negative for congestion, ear pain, hearing loss, sinus pain and sore throat.   Eyes:  Negative for blurred vision and pain.  Respiratory:  Negative for cough, sputum production, shortness of breath and wheezing.   Cardiovascular:  Negative for chest pain and palpitations.  Gastrointestinal:  Negative for blood in stool, constipation, diarrhea, nausea and vomiting.  Genitourinary:  Negative for dysuria, frequency, hematuria and urgency.  Musculoskeletal:  Negative for back pain, falls and myalgias.  Neurological:  Positive for headaches. Negative for dizziness, sensory change, loss of consciousness and weakness.  Endo/Heme/Allergies:  Negative for environmental allergies. Does not bruise/bleed easily.  Psychiatric/Behavioral:   Negative for depression and suicidal ideas. The patient is not nervous/anxious and does not have insomnia.        (+) stress      Objective:    Physical Exam Constitutional:      General: She is not in acute distress.    Appearance: Normal appearance. She is not ill-appearing.  HENT:     Head: Normocephalic and atraumatic.     Right Ear: External ear normal.     Left Ear: External ear normal.  Eyes:     Extraocular Movements: Extraocular movements intact.     Pupils: Pupils are equal, round, and reactive to light.  Cardiovascular:     Rate and Rhythm: Normal rate and regular rhythm.     Pulses: Normal pulses.     Heart sounds: Normal heart sounds. No murmur heard.   No gallop.  Pulmonary:     Effort: Pulmonary effort is normal. No respiratory distress.     Breath sounds: Normal breath sounds. No wheezing, rhonchi or rales.  Abdominal:     General: Bowel sounds are normal. There is no distension.     Palpations: Abdomen is soft. There is no mass.     Tenderness: There is no abdominal tenderness. There is no guarding or rebound.     Hernia: No hernia is present.  Musculoskeletal:     Cervical back: Normal range of motion and neck supple.  Lymphadenopathy:     Cervical: No cervical adenopathy.  Skin:    General: Skin is warm and dry.  Neurological:     Mental Status: She is alert and oriented to person, place, and time.  Psychiatric:        Behavior: Behavior normal.    BP 110/80 (BP Location: Left Arm, Patient Position: Sitting, Cuff Size: Large)    Pulse 62    Temp  97.7 F (36.5 C) (Oral)    Resp 18    Ht $R'5\' 6"'Gh$  (1.676 m)    Wt 227 lb 12.8 oz (103.3 kg)    LMP  (LMP Unknown)    SpO2 98%    BMI 36.77 kg/m  Wt Readings from Last 3 Encounters:  03/05/21 227 lb 12.8 oz (103.3 kg)  02/17/21 228 lb (103.4 kg)  02/11/21 230 lb (104.3 kg)    Diabetic Foot Exam - Simple   No data filed    Lab Results  Component Value Date   WBC 18.0 (H) 02/11/2021   HGB 14.0 02/11/2021    HCT 44.0 02/11/2021   PLT 283 02/11/2021   GLUCOSE 87 02/17/2021   CHOL 178 02/17/2021   TRIG 113.0 02/17/2021   HDL 54.60 02/17/2021   LDLDIRECT 167.0 01/31/2020   LDLCALC 101 (H) 02/17/2021   ALT 9 02/17/2021   AST 12 02/17/2021   NA 138 02/17/2021   K 4.1 02/17/2021   CL 100 02/17/2021   CREATININE 0.59 02/17/2021   BUN 11 02/17/2021   CO2 28 02/17/2021   TSH 0.78 02/17/2021   INR 0.9 05/06/2016   MICROALBUR <0.7 04/23/2016    Lab Results  Component Value Date   TSH 0.78 02/17/2021   Lab Results  Component Value Date   WBC 18.0 (H) 02/11/2021   HGB 14.0 02/11/2021   HCT 44.0 02/11/2021   MCV 95.4 02/11/2021   PLT 283 02/11/2021   Lab Results  Component Value Date   NA 138 02/17/2021   K 4.1 02/17/2021   CHLORIDE 102 03/10/2016   CO2 28 02/17/2021   GLUCOSE 87 02/17/2021   BUN 11 02/17/2021   CREATININE 0.59 02/17/2021   BILITOT 0.6 02/17/2021   ALKPHOS 108 02/17/2021   AST 12 02/17/2021   ALT 9 02/17/2021   PROT 7.3 02/17/2021   ALBUMIN 4.6 02/17/2021   CALCIUM 11.0 (H) 02/17/2021   ANIONGAP 8 02/11/2021   EGFR 105 08/27/2020   GFR 103.27 02/17/2021   Lab Results  Component Value Date   CHOL 178 02/17/2021   Lab Results  Component Value Date   HDL 54.60 02/17/2021   Lab Results  Component Value Date   LDLCALC 101 (H) 02/17/2021   Lab Results  Component Value Date   TRIG 113.0 02/17/2021   Lab Results  Component Value Date   CHOLHDL 3 02/17/2021   No results found for: HGBA1C     Assessment & Plan:   Problem List Items Addressed This Visit       Unprioritized   HTN (hypertension) - Primary   Hypertension    Well controlled, no changes to meds. Encouraged heart healthy diet such as the DASH diet and exercise as tolerated.  Pt will come in so we can check her bp cuff and make sure it is the right size and accurate bp here was normal        Nonintractable headache    Take tylenol at the first sign of a headache Keep a  journal of the headaches  F/u in 2-3 weeks or sooner prn         No orders of the defined types were placed in this encounter.   I,Zite Okoli,acting as a Education administrator for [employer], DO.,have documented all relevant documentation on the behalf of Ann Held, DO,as directed by  Ann Held, DO while in the presence of Lethea Killings  R Carollee Herter, DO., personally preformed the services described in this documentation.  All medical record entries made by the scribe were at my direction and in my presence.  I have reviewed the chart and discharge instructions (if applicable) and agree that the record reflects my personal performance and is accurate and complete. 03/05/2021

## 2021-03-05 NOTE — Assessment & Plan Note (Signed)
Take tylenol at the first sign of a headache Keep a journal of the headaches  F/u in 2-3 weeks or sooner prn

## 2021-03-05 NOTE — Patient Instructions (Signed)
Form - Headache Record There are many types and causes of headaches. A headache record can help guide your treatment plan. Use this form to record the details. Bring this form with you to your follow-up visits. Follow your health care provider's instructions on how to describe your headache. You may be asked to: Use a pain scale. This is a tool to rate the intensity of your headache using words or numbers. Describe what your headache feels like, such as dull, achy, throbbing, or sharp. Headache record Date: _______________ Time (from start to end): ____________________ Location of the headache: _________________________ Intensity of the headache: ____________________ Description of the headache: ______________________________________________________________ Hours of sleep the night before the headache: __________ Food or drinks before the headache started: ______________________________________________________________________________________ Events before the headache started: _______________________________________________________________________________________________ Symptoms before the headache started: __________________________________________________________________________________________ Symptoms during the headache: __________________________________________________________________________________________________ Treatment: ________________________________________________________________________________________________________________ Effect of treatment: _________________________________________________________________________________________________________ Other comments: ___________________________________________________________________________________________________________ Date: _______________ Time (from start to end): ____________________ Location of the headache: _________________________ Intensity of the headache: ____________________ Description of the headache:  ______________________________________________________________ Hours of sleep the night before the headache: __________ Food or drinks before the headache started: ______________________________________________________________________________________ Events before the headache started: ____________________________________________________________________________________________ Symptoms before the headache started: _________________________________________________________________________________________ Symptoms during the headache: _______________________________________________________________________________________________ Treatment: ________________________________________________________________________________________________________________ Effect of treatment: _________________________________________________________________________________________________________ Other comments: ___________________________________________________________________________________________________________ Date: _______________ Time (from start to end): ____________________ Location of the headache: _________________________ Intensity of the headache: ____________________ Description of the headache: ______________________________________________________________ Hours of sleep the night before the headache: __________ Food or drinks before the headache started: ______________________________________________________________________________________ Events before the headache started: ____________________________________________________________________________________________ Symptoms before the headache started: _________________________________________________________________________________________ Symptoms during the headache: _______________________________________________________________________________________________ Treatment:  ________________________________________________________________________________________________________________ Effect of treatment: _________________________________________________________________________________________________________ Other comments: ___________________________________________________________________________________________________________ Date: _______________ Time (from start to end): ____________________ Location of the headache: _________________________ Intensity of the headache: ____________________ Description of the headache: ______________________________________________________________ Hours of sleep the night before the headache: _________ Food or drinks before the headache started: ______________________________________________________________________________________ Events before the headache started: ____________________________________________________________________________________________ Symptoms before the headache started: _________________________________________________________________________________________ Symptoms during the headache: _______________________________________________________________________________________________ Treatment: ________________________________________________________________________________________________________________ Effect of treatment: _________________________________________________________________________________________________________ Other comments: ___________________________________________________________________________________________________________ Date: _______________ Time (from start to end): ____________________ Location of the headache: _________________________ Intensity of the headache: ____________________ Description of the headache: ______________________________________________________________ Hours of sleep the night before the headache: _________ Food or drinks before the headache started:  ______________________________________________________________________________________ Events before the headache started: ____________________________________________________________________________________________ Symptoms before the headache started: _________________________________________________________________________________________ Symptoms during the headache: _______________________________________________________________________________________________ Treatment: ________________________________________________________________________________________________________________ Effect of treatment: _________________________________________________________________________________________________________ Other comments: ___________________________________________________________________________________________________________ This information is not intended to replace advice given to you by your health care provider. Make sure you discuss any questions you have with your health care provider. Document Revised: 08/06/2020 Document Reviewed: 08/06/2020 Elsevier Patient Education  [place].

## 2021-03-05 NOTE — Assessment & Plan Note (Addendum)
Well controlled, no changes to meds. Encouraged heart healthy diet such as the DASH diet and exercise as tolerated.  Pt will come in so we can check her bp cuff and make sure it is the right size and accurate bp here was normal

## 2021-03-10 ENCOUNTER — Ambulatory Visit (INDEPENDENT_AMBULATORY_CARE_PROVIDER_SITE_OTHER): Payer: Medicare Other

## 2021-03-10 DIAGNOSIS — I10 Essential (primary) hypertension: Secondary | ICD-10-CM | POA: Diagnosis not present

## 2021-03-10 NOTE — Progress Notes (Signed)
Pt here for Blood pressure check per Dr Etter Sjogren  Pt currently takes: Norvasc 10, Lasix 20.   Pt reports compliance with medication.  BP today @ =  112/62 manual in L arm: 144/80 And then L arm Automatic cuff HR = 62, 134/82 in the right arm manual.   Pt advised that since Dr Etter Sjogren is seeing pts we will call her with further instructions. Pt aware and voices understanding.   Pt is also now having some soreness in her scalp on the right side that came on on Saturday night. The next morning she woke up with that same feeling on the left side, the feeling is now gone. She des have that strange sensation in her face and nose. She asks what this could be.

## 2021-03-11 ENCOUNTER — Ambulatory Visit: Payer: Medicare Other

## 2021-03-17 ENCOUNTER — Encounter (HOSPITAL_BASED_OUTPATIENT_CLINIC_OR_DEPARTMENT_OTHER): Payer: Self-pay

## 2021-03-17 ENCOUNTER — Other Ambulatory Visit: Payer: Self-pay

## 2021-03-17 ENCOUNTER — Ambulatory Visit (HOSPITAL_BASED_OUTPATIENT_CLINIC_OR_DEPARTMENT_OTHER)
Admission: RE | Admit: 2021-03-17 | Discharge: 2021-03-17 | Disposition: A | Discharge status: Home / Self Care | Payer: Medicare Other | Source: Ambulatory Visit | Attending: Family Medicine | Admitting: Family Medicine

## 2021-03-17 DIAGNOSIS — Z1231 Encounter for screening mammogram for malignant neoplasm of breast: Secondary | ICD-10-CM | POA: Diagnosis not present

## 2021-03-17 DIAGNOSIS — Z1239 Encounter for other screening for malignant neoplasm of breast: Secondary | ICD-10-CM

## 2021-05-21 ENCOUNTER — Ambulatory Visit: Payer: Medicare Other | Admitting: Hematology & Oncology

## 2021-05-21 ENCOUNTER — Other Ambulatory Visit: Payer: Medicare Other

## 2021-06-04 ENCOUNTER — Ambulatory Visit: Payer: Medicare Other | Admitting: Family Medicine

## 2021-06-05 ENCOUNTER — Encounter: Payer: Self-pay | Admitting: Hematology & Oncology

## 2021-06-05 ENCOUNTER — Inpatient Hospital Stay: Payer: Medicare Other | Attending: Hematology & Oncology

## 2021-06-05 ENCOUNTER — Ambulatory Visit (HOSPITAL_BASED_OUTPATIENT_CLINIC_OR_DEPARTMENT_OTHER): Payer: Medicare Other

## 2021-06-05 ENCOUNTER — Other Ambulatory Visit: Payer: Self-pay

## 2021-06-05 ENCOUNTER — Inpatient Hospital Stay (HOSPITAL_BASED_OUTPATIENT_CLINIC_OR_DEPARTMENT_OTHER): Payer: Medicare Other | Admitting: Hematology & Oncology

## 2021-06-05 VITALS — BP 137/64 | HR 67 | Temp 98.1°F | Resp 18 | Ht 66.0 in | Wt 228.1 lb

## 2021-06-05 DIAGNOSIS — R1012 Left upper quadrant pain: Secondary | ICD-10-CM | POA: Diagnosis not present

## 2021-06-05 DIAGNOSIS — D471 Chronic myeloproliferative disease: Secondary | ICD-10-CM | POA: Insufficient documentation

## 2021-06-05 DIAGNOSIS — E039 Hypothyroidism, unspecified: Secondary | ICD-10-CM | POA: Diagnosis not present

## 2021-06-05 LAB — CBC WITH DIFFERENTIAL (CANCER CENTER ONLY)
Abs Immature Granulocytes: 0.42 10*3/uL — ABNORMAL HIGH (ref 0.00–0.07)
Basophils Absolute: 0.1 10*3/uL (ref 0.0–0.1)
Basophils Relative: 1 %
Eosinophils Absolute: 0.1 10*3/uL (ref 0.0–0.5)
Eosinophils Relative: 1 %
HCT: 45.1 % (ref 36.0–46.0)
Hemoglobin: 14.6 g/dL (ref 12.0–15.0)
Immature Granulocytes: 2 %
Lymphocytes Relative: 14 %
Lymphs Abs: 2.7 10*3/uL (ref 0.7–4.0)
MCH: 30.6 pg (ref 26.0–34.0)
MCHC: 32.4 g/dL (ref 30.0–36.0)
MCV: 94.5 fL (ref 80.0–100.0)
Monocytes Absolute: 1 10*3/uL (ref 0.1–1.0)
Monocytes Relative: 5 %
Neutro Abs: 15.2 10*3/uL — ABNORMAL HIGH (ref 1.7–7.7)
Neutrophils Relative %: 77 %
Platelet Count: 305 10*3/uL (ref 150–400)
RBC: 4.77 MIL/uL (ref 3.87–5.11)
RDW: 12.2 % (ref 11.5–15.5)
WBC Count: 19.6 10*3/uL — ABNORMAL HIGH (ref 4.0–10.5)
nRBC: 0 % (ref 0.0–0.2)

## 2021-06-05 LAB — CMP (CANCER CENTER ONLY)
ALT: 11 U/L (ref 0–44)
AST: 13 U/L — ABNORMAL LOW (ref 15–41)
Albumin: 5 g/dL (ref 3.5–5.0)
Alkaline Phosphatase: 148 U/L — ABNORMAL HIGH (ref 38–126)
Anion gap: 9 (ref 5–15)
BUN: 12 mg/dL (ref 6–20)
CO2: 29 mmol/L (ref 22–32)
Calcium: 11.7 mg/dL — ABNORMAL HIGH (ref 8.9–10.3)
Chloride: 100 mmol/L (ref 98–111)
Creatinine: 0.63 mg/dL (ref 0.44–1.00)
GFR, Estimated: >60 mL/min (ref >60)
Glucose, Bld: 100 mg/dL — ABNORMAL HIGH (ref 70–99)
Potassium: 4.1 mmol/L (ref 3.5–5.1)
Sodium: 138 mmol/L (ref 135–145)
Total Bilirubin: 0.6 mg/dL (ref 0.3–1.2)
Total Protein: 8.4 g/dL — ABNORMAL HIGH (ref 6.5–8.1)

## 2021-06-05 LAB — LACTATE DEHYDROGENASE: LDH: 150 U/L (ref 98–192)

## 2021-06-05 LAB — SAVE SMEAR(SSMR), FOR PROVIDER SLIDE REVIEW

## 2021-06-05 NOTE — Progress Notes (Signed)
?Hematology and Oncology Follow Up Visit ? ?[name] ?[id number] ?[birth year] 51 y.o. ?06/05/2021 ? ? ?Principle Diagnosis:  ?Chronic myeloproliferative syndrome-Triple negative ?Sub-clinical hypothyroidism ?(+) p-ANCA ? ?Current Therapy:   ?Peg-Interferon q weekly - q 4 week dosing - On hold due to cost ?Synthroid 0.050 mg po q day ?  ?Interim History: [name] is here today for follow-up.  As always, she has good news and bad news.  The good news is that she is can be a grandmother again.  Her youngest son [place] and his girlfriend are having a baby girl.  This will happen in May. ? ?The bad news is that several of her friends and family have had health issues.  This is really been stressful for her.  She is still taking care of her mom.  This has been a challenge for her but she certainly seems to be managing. ? ?She is not complaining of this acute pain in her abdomen.  This is in the upper abdomen.  There is no vomiting.  There is no diarrhea.  There is no fever. ? ?She is worried that might some of the might be going on.  I think that the only way we will know is with a CT scan.  We will try to get was set up for an a day or so. ? ?She has had no bleeding.  There is been no urinary difficulties.  She has had her joint problems from arthritis. ? ?Overall, I would say her performance status is probably ECOG 1.    ?  ? ?Medications:  ?Allergies as of 06/05/2021   ? ?   Reactions  ? Lisinopril Cough  ? Losartan Cough  ? Lubiprostone Nausea And Vomiting  ? Pravachol [pravastatin Sodium] Other (See Comments)  ? "Causes pain in my body"  ? Repatha [evolocumab] Cough  ? Severe cough that would not respond to any treatment - per patient.  ? ?  ? ?  ?Medication List  ?  ? ?  ? Accurate as of June 05, 2021 11:52 AM. If you have any questions, ask your nurse or doctor.  ?  ?  ? ?  ? ?amLODipine 10 MG tablet ?Commonly known as: NORVASC ?TAKE 1 TABLET BY MOUTH EVERY DAY ?  ?aspirin 81 MG tablet ?Take 81 mg by mouth  daily. ?  ?chlorhexidine 0.12 % solution ?Commonly known as: PERIDEX ?Use as directed 5 mLs in the mouth or throat daily. ?  ?ezetimibe 10 MG tablet ?Commonly known as: ZETIA ?Take 1 tablet (10 mg total) by mouth daily. ?  ?furosemide 20 MG tablet ?Commonly known as: LASIX ?TAKE 1 TABLET BY MOUTH EVERY DAY ?  ?KRILL OIL PO ?Take 1,200 mg by mouth daily. ?  ?levothyroxine 50 MCG tablet ?Commonly known as: SYNTHROID ?TAKE 1 TABLET BY MOUTH DAILY BEFORE BREAKFAST ?  ?Magnesium 500 MG Caps ?Take by mouth daily. ?  ?melatonin 5 MG Tabs ?Take 5 mg by mouth at bedtime as needed. ?  ?omeprazole 20 MG capsule ?Commonly known as: PRILOSEC ?TAKE 1 CAPSULE BY MOUTH EVERY DAY ?  ?vitamin C 1000 MG tablet ?Take by mouth daily. ?  ?VITAMIN D3 PO ?Take by mouth daily. ?  ?zinc gluconate 50 MG tablet ?Take 50 mg by mouth daily. ?  ? ?  ? ? ?Allergies:  ?Allergies  ?Allergen Reactions  ? Lisinopril Cough  ? Losartan Cough  ? Lubiprostone Nausea And Vomiting  ? Pravachol [Pravastatin Sodium] Other (See Comments)  ?  "  Causes pain in my body"  ? Repatha [Evolocumab] Cough  ?  Severe cough that would not respond to any treatment - per patient.  ? ? ?Past Medical History, Surgical history, Social history, and Family History were reviewed and updated. ? ?Review of Systems: ?Review of Systems  ?Constitutional:  Positive for malaise/fatigue.  ?HENT: Negative.    ?Eyes: Negative.   ?Respiratory: Negative.    ?Cardiovascular: Negative.   ?Gastrointestinal:  Positive for diarrhea.  ?Genitourinary: Negative.   ?Musculoskeletal:  Positive for myalgias.  ?Skin: Negative.   ?Neurological:  Positive for dizziness.  ?Psychiatric/Behavioral: Negative.    ? ? ?Physical Exam: ? height is '5\' 6"'$  (1.676 m) and weight is 103.5 kg. Her oral temperature is 98.1 ?F (36.7 ?C). Her blood pressure is 137/64 and her pulse is 67. Her respiration is 18 and oxygen saturation is 100%.  ? ?Wt Readings from Last 3 Encounters:  ?06/05/21 103.5 kg  ?03/05/21 103.3 kg   ?02/17/21 103.4 kg  ? ? ?Physical Exam ?Vitals reviewed.  ?HENT:  ?   Head: Normocephalic and atraumatic.  ?Eyes:  ?   Pupils: Pupils are equal, round, and reactive to light.  ?Cardiovascular:  ?   Rate and Rhythm: Normal rate and regular rhythm.  ?   Heart sounds: Normal heart sounds.  ?Pulmonary:  ?   Effort: Pulmonary effort is normal.  ?   Breath sounds: Normal breath sounds.  ?Abdominal:  ?   General: Bowel sounds are normal.  ?   Palpations: Abdomen is soft.  ?Musculoskeletal:     ?   General: No tenderness or deformity. Normal range of motion.  ?   Cervical back: Normal range of motion.  ?Lymphadenopathy:  ?   Cervical: No cervical adenopathy.  ?Skin: ?   General: Skin is warm and dry.  ?   Findings: No erythema or rash.  ?Neurological:  ?   Mental Status: She is alert and oriented to person, place, and time.  ?Psychiatric:     ?   Behavior: Behavior normal.     ?   Thought Content: Thought content normal.     ?   Judgment: Judgment normal.  ? ?Lab Results  ?Component Value Date  ? WBC 19.6 (H) 06/05/2021  ? HGB 14.6 06/05/2021  ? HCT 45.1 06/05/2021  ? MCV 94.5 06/05/2021  ? PLT 305 06/05/2021  ? ?Lab Results  ?Component Value Date  ? FERRITIN 64 06/06/2018  ? IRON 82 06/06/2018  ? TIBC 338 06/06/2018  ? UIBC 256 06/06/2018  ? IRONPCTSAT 24 06/06/2018  ? ?Lab Results  ?Component Value Date  ? RETICCTPCT 2.1 04/12/2017  ? RBC 4.77 06/05/2021  ? RETICCTABS 116.1 11/20/2013  ? ?No results found for: KPAFRELGTCHN, LAMBDASER, KAPLAMBRATIO ?No results found for: IGGSERUM, IGA, IGMSERUM ?No results found for: TOTALPROTELP, ALBUMINELP, A1GS, A2GS, BETS, BETA2SER, GAMS, MSPIKE, SPEI ?  Chemistry   ?   ?Component Value Date/Time  ? NA 138 06/05/2021 1029  ? NA 137 08/27/2020 1459  ? NA 140 02/08/2017 0925  ? NA 136 03/10/2016 1147  ? K 4.1 06/05/2021 1029  ? K 3.4 02/08/2017 0925  ? K 3.9 03/10/2016 1147  ? CL 100 06/05/2021 1029  ? CL 105 02/08/2017 0925  ? CO2 29 06/05/2021 1029  ? CO2 26 02/08/2017 0925  ? CO2 24  03/10/2016 1147  ? BUN 12 06/05/2021 1029  ? BUN 11 08/27/2020 1459  ? BUN 10 02/08/2017 0925  ? BUN 10.2 03/10/2016 1147  ?  CREATININE 0.63 06/05/2021 1029  ? CREATININE 0.7 02/08/2017 0925  ? CREATININE 0.7 03/10/2016 1147  ?    ?Component Value Date/Time  ? CALCIUM 11.7 (H) 06/05/2021 1029  ? CALCIUM 10.4 (H) 02/08/2017 0925  ? CALCIUM 10.7 (H) 03/10/2016 1147  ? ALKPHOS 148 (H) 06/05/2021 1029  ? ALKPHOS 106 (H) 02/08/2017 0925  ? ALKPHOS 132 03/10/2016 1147  ? AST 13 (L) 06/05/2021 1029  ? AST 12 03/10/2016 1147  ? ALT 11 06/05/2021 1029  ? ALT 16 02/08/2017 0925  ? ALT 8 03/10/2016 1147  ? BILITOT 0.6 06/05/2021 1029  ? BILITOT 0.63 03/10/2016 1147  ?  ? ? ?Impression and Plan: Ms. Dejoy is a very pleasant 51 yo caucasian female with a triple negative myeloproliferative syndrome though all work up has been negative so far.  ? ?I really hate the fact that she is having all these issues.  We will certainly try her best to help with the abdominal pain.  We will see what a CT scan shows.  I will know if this might be gallbladder or not. ? ?I would like to see her back in a couple months.  Given everything going on, I want to see her back a little bit sooner. ? ?[name], MD ?3/17/202311:52 AM  ?

## 2021-06-06 ENCOUNTER — Encounter (HOSPITAL_BASED_OUTPATIENT_CLINIC_OR_DEPARTMENT_OTHER): Payer: Self-pay

## 2021-06-06 ENCOUNTER — Ambulatory Visit (HOSPITAL_BASED_OUTPATIENT_CLINIC_OR_DEPARTMENT_OTHER)
Admission: RE | Admit: 2021-06-06 | Discharge: 2021-06-06 | Disposition: A | Discharge status: Home / Self Care | Payer: Medicare Other | Source: Ambulatory Visit | Attending: Hematology & Oncology | Admitting: Hematology & Oncology

## 2021-06-06 DIAGNOSIS — I700 Atherosclerosis of aorta: Secondary | ICD-10-CM | POA: Diagnosis not present

## 2021-06-06 DIAGNOSIS — R1012 Left upper quadrant pain: Secondary | ICD-10-CM | POA: Diagnosis not present

## 2021-06-06 DIAGNOSIS — R109 Unspecified abdominal pain: Secondary | ICD-10-CM | POA: Diagnosis not present

## 2021-06-06 MED ORDER — IOHEXOL 300 MG/ML  SOLN
100.0000 mL | Freq: Once | INTRAMUSCULAR | Status: AC | PRN
  Administered 2021-06-06: 100 mL via INTRAVENOUS

## 2021-06-08 ENCOUNTER — Telehealth: Payer: Self-pay

## 2021-06-08 NOTE — Telephone Encounter (Signed)
-----   Message from [name], MD sent at 06/06/2021 12:31 PM EDT ----- ?Call - the CT scan really doesn't show much as to why you have the pain.  There may be a gallstone, but it really should not be a problem!!!!  Laurey Arrow ?

## 2021-06-08 NOTE — Telephone Encounter (Signed)
Called and informed patient of lab results, patient verbalized understanding and states she has not has much abdominal pain this past weekend but has had back pain. Pt would like to know what steps to take next. CT scan mentioned and u/s. ? ?

## 2021-06-08 NOTE — Telephone Encounter (Signed)
Per Dr Marin Olp he does not recommend an ultrasound but will order one if pt would like to further evaluate.  If not he recommends following up with PCP regarding back pain. Pt advised and denies u/s order at this time and will f/u with PCP.  ?

## 2021-06-26 DIAGNOSIS — R051 Acute cough: Secondary | ICD-10-CM | POA: Diagnosis not present

## 2021-06-26 DIAGNOSIS — J069 Acute upper respiratory infection, unspecified: Secondary | ICD-10-CM | POA: Diagnosis not present

## 2021-06-26 DIAGNOSIS — R0981 Nasal congestion: Secondary | ICD-10-CM | POA: Diagnosis not present

## 2021-06-26 DIAGNOSIS — J029 Acute pharyngitis, unspecified: Secondary | ICD-10-CM | POA: Diagnosis not present

## 2021-08-03 ENCOUNTER — Inpatient Hospital Stay: Payer: Medicare Other | Attending: Hematology & Oncology

## 2021-08-03 ENCOUNTER — Encounter: Payer: Self-pay | Admitting: Hematology & Oncology

## 2021-08-03 ENCOUNTER — Inpatient Hospital Stay (HOSPITAL_BASED_OUTPATIENT_CLINIC_OR_DEPARTMENT_OTHER): Payer: Medicare Other | Admitting: Hematology & Oncology

## 2021-08-03 VITALS — BP 127/62 | HR 56 | Temp 98.1°F | Resp 20 | Wt 228.1 lb

## 2021-08-03 DIAGNOSIS — D471 Chronic myeloproliferative disease: Secondary | ICD-10-CM | POA: Insufficient documentation

## 2021-08-03 DIAGNOSIS — Z79899 Other long term (current) drug therapy: Secondary | ICD-10-CM | POA: Insufficient documentation

## 2021-08-03 DIAGNOSIS — E039 Hypothyroidism, unspecified: Secondary | ICD-10-CM | POA: Insufficient documentation

## 2021-08-03 LAB — LACTATE DEHYDROGENASE: LDH: 151 U/L (ref 98–192)

## 2021-08-03 LAB — CBC WITH DIFFERENTIAL (CANCER CENTER ONLY)
Abs Immature Granulocytes: 0.35 10*3/uL — ABNORMAL HIGH (ref 0.00–0.07)
Basophils Absolute: 0.1 10*3/uL (ref 0.0–0.1)
Basophils Relative: 1 %
Eosinophils Absolute: 0.1 10*3/uL (ref 0.0–0.5)
Eosinophils Relative: 1 %
HCT: 40.3 % (ref 36.0–46.0)
Hemoglobin: 12.9 g/dL (ref 12.0–15.0)
Immature Granulocytes: 2 %
Lymphocytes Relative: 13 %
Lymphs Abs: 2.4 10*3/uL (ref 0.7–4.0)
MCH: 30.1 pg (ref 26.0–34.0)
MCHC: 32 g/dL (ref 30.0–36.0)
MCV: 94.2 fL (ref 80.0–100.0)
Monocytes Absolute: 0.8 10*3/uL (ref 0.1–1.0)
Monocytes Relative: 5 %
Neutro Abs: 14 10*3/uL — ABNORMAL HIGH (ref 1.7–7.7)
Neutrophils Relative %: 78 %
Platelet Count: 274 10*3/uL (ref 150–400)
RBC: 4.28 MIL/uL (ref 3.87–5.11)
RDW: 12.6 % (ref 11.5–15.5)
WBC Count: 17.7 10*3/uL — ABNORMAL HIGH (ref 4.0–10.5)
nRBC: 0 % (ref 0.0–0.2)

## 2021-08-03 LAB — FERRITIN: Ferritin: 60 ng/mL (ref 11–307)

## 2021-08-03 LAB — IRON AND IRON BINDING CAPACITY (CC-WL,HP ONLY)
Iron: 89 ug/dL (ref 28–170)
Saturation Ratios: 25 % (ref 10.4–31.8)
TIBC: 350 ug/dL (ref 250–450)
UIBC: 261 ug/dL (ref 148–442)

## 2021-08-03 LAB — CMP (CANCER CENTER ONLY)
ALT: 9 U/L (ref 0–44)
AST: 12 U/L — ABNORMAL LOW (ref 15–41)
Albumin: 4.6 g/dL (ref 3.5–5.0)
Alkaline Phosphatase: 121 U/L (ref 38–126)
Anion gap: 8 (ref 5–15)
BUN: 11 mg/dL (ref 6–20)
CO2: 26 mmol/L (ref 22–32)
Calcium: 10.7 mg/dL — ABNORMAL HIGH (ref 8.9–10.3)
Chloride: 104 mmol/L (ref 98–111)
Creatinine: 0.57 mg/dL (ref 0.44–1.00)
GFR, Estimated: >60 mL/min (ref >60)
Glucose, Bld: 98 mg/dL (ref 70–99)
Potassium: 3.6 mmol/L (ref 3.5–5.1)
Sodium: 138 mmol/L (ref 135–145)
Total Bilirubin: 0.6 mg/dL (ref 0.3–1.2)
Total Protein: 7.1 g/dL (ref 6.5–8.1)

## 2021-08-03 LAB — SAVE SMEAR(SSMR), FOR PROVIDER SLIDE REVIEW

## 2021-08-03 NOTE — Progress Notes (Signed)
?Hematology and Oncology Follow Up Visit ? ?[name] ?[id number] ?1971/02/01 51 y.o. ?08/03/2021 ? ? ?Principle Diagnosis:  ?Chronic myeloproliferative syndrome-Triple negative ?Sub-clinical hypothyroidism ?(+) p-ANCA ? ?Current Therapy:   ?Peg-Interferon q weekly - q 4 week dosing - On hold due to cost ?Synthroid 0.050 mg po q day ?  ?Interim History: Brianna Fletcher is here today for follow-up.  Thankfully, she is feeling better than last time that we saw her.  She did have a nice Easter.  She did have a nice Mother's Day. ? ?She now has another new grandchild.  A baby girl was born down in [place] 2 weeks early. ? ?She is still having to take care of her mom.  She is doing a fantastic job with this. ? ?She has had no problem with fever.  She has had no cough or shortness of breath.  There has been no change in bowel or bladder habits.  She has had no issues with rashes.  There is been no leg swelling. ? ?Overall, I would say performance status is probably ECOG 1.   ?  ? ?Medications:  ?Allergies as of 08/03/2021   ? ?   Reactions  ? Lisinopril Cough  ? Losartan Cough  ? Lubiprostone Nausea And Vomiting  ? Pravachol [pravastatin Sodium] Other (See Comments)  ? "Causes pain in my body"  ? Repatha [evolocumab] Cough  ? Severe cough that would not respond to any treatment - per patient.  ? ?  ? ?  ?Medication List  ?  ? ?  ? Accurate as of Aug 03, 2021 12:11 PM. If you have any questions, ask your nurse or doctor.  ?  ?  ? ?  ? ?amLODipine 10 MG tablet ?Commonly known as: NORVASC ?TAKE 1 TABLET BY MOUTH EVERY DAY ?  ?aspirin 81 MG tablet ?Take 81 mg by mouth daily. ?  ?chlorhexidine 0.12 % solution ?Commonly known as: PERIDEX ?Use as directed 5 mLs in the mouth or throat daily. ?  ?ezetimibe 10 MG tablet ?Commonly known as: ZETIA ?Take 1 tablet (10 mg total) by mouth daily. ?  ?furosemide 20 MG tablet ?Commonly known as: LASIX ?TAKE 1 TABLET BY MOUTH EVERY DAY ?  ?KRILL OIL PO ?Take 1,200 mg by mouth daily. ?   ?levothyroxine 50 MCG tablet ?Commonly known as: SYNTHROID ?TAKE 1 TABLET BY MOUTH DAILY BEFORE BREAKFAST ?  ?Magnesium 500 MG Caps ?Take by mouth daily. ?  ?melatonin 5 MG Tabs ?Take 5 mg by mouth at bedtime as needed. ?  ?omeprazole 20 MG capsule ?Commonly known as: PRILOSEC ?TAKE 1 CAPSULE BY MOUTH EVERY DAY ?  ?vitamin C 1000 MG tablet ?Take by mouth daily. ?  ?VITAMIN D3 PO ?Take 2,000 Int'l Units by mouth daily. ?  ?zinc gluconate 50 MG tablet ?Take 50 mg by mouth daily. ?  ? ?  ? ? ?Allergies:  ?Allergies  ?Allergen Reactions  ? Lisinopril Cough  ? Losartan Cough  ? Lubiprostone Nausea And Vomiting  ? Pravachol [Pravastatin Sodium] Other (See Comments)  ?  "Causes pain in my body"  ? Repatha [Evolocumab] Cough  ?  Severe cough that would not respond to any treatment - per patient.  ? ? ?Past Medical History, Surgical history, Social history, and Family History were reviewed and updated. ? ?Review of Systems: ?Review of Systems  ?Constitutional:  Positive for malaise/fatigue.  ?HENT: Negative.    ?Eyes: Negative.   ?Respiratory: Negative.    ?Cardiovascular: Negative.   ?Gastrointestinal:  Positive for diarrhea.  ?Genitourinary: Negative.   ?Musculoskeletal:  Positive for myalgias.  ?Skin: Negative.   ?Neurological:  Positive for dizziness.  ?Psychiatric/Behavioral: Negative.    ? ? ?Physical Exam: ? weight is 228 lb 1.9 oz (103.5 kg). Her oral temperature is 98.1 ?F (36.7 ?C). Her blood pressure is 127/62 and her pulse is 56 (abnormal). Her respiration is 20 and oxygen saturation is 100%.  ? ?Wt Readings from Last 3 Encounters:  ?08/03/21 228 lb 1.9 oz (103.5 kg)  ?06/05/21 228 lb 1.3 oz (103.5 kg)  ?03/05/21 227 lb 12.8 oz (103.3 kg)  ? ? ?Physical Exam ?Vitals reviewed.  ?HENT:  ?   Head: Normocephalic and atraumatic.  ?Eyes:  ?   Pupils: Pupils are equal, round, and reactive to light.  ?Cardiovascular:  ?   Rate and Rhythm: Normal rate and regular rhythm.  ?   Heart sounds: Normal heart sounds.   ?Pulmonary:  ?   Effort: Pulmonary effort is normal.  ?   Breath sounds: Normal breath sounds.  ?Abdominal:  ?   General: Bowel sounds are normal.  ?   Palpations: Abdomen is soft.  ?Musculoskeletal:     ?   General: No tenderness or deformity. Normal range of motion.  ?   Cervical back: Normal range of motion.  ?Lymphadenopathy:  ?   Cervical: No cervical adenopathy.  ?Skin: ?   General: Skin is warm and dry.  ?   Findings: No erythema or rash.  ?Neurological:  ?   Mental Status: She is alert and oriented to person, place, and time.  ?Psychiatric:     ?   Behavior: Behavior normal.     ?   Thought Content: Thought content normal.     ?   Judgment: Judgment normal.  ? ?Lab Results  ?Component Value Date  ? WBC 17.7 (H) 08/03/2021  ? HGB 12.9 08/03/2021  ? HCT 40.3 08/03/2021  ? MCV 94.2 08/03/2021  ? PLT 274 08/03/2021  ? ?Lab Results  ?Component Value Date  ? FERRITIN 64 06/06/2018  ? IRON 82 06/06/2018  ? TIBC 338 06/06/2018  ? UIBC 256 06/06/2018  ? IRONPCTSAT 24 06/06/2018  ? ?Lab Results  ?Component Value Date  ? RETICCTPCT 2.1 04/12/2017  ? RBC 4.28 08/03/2021  ? RETICCTABS 116.1 11/20/2013  ? ?No results found for: KPAFRELGTCHN, LAMBDASER, KAPLAMBRATIO ?No results found for: IGGSERUM, IGA, IGMSERUM ?No results found for: TOTALPROTELP, ALBUMINELP, A1GS, A2GS, BETS, BETA2SER, GAMS, MSPIKE, SPEI ?  Chemistry   ?   ?Component Value Date/Time  ? NA 138 08/03/2021 1028  ? NA 137 08/27/2020 1459  ? NA 140 02/08/2017 0925  ? NA 136 03/10/2016 1147  ? K 3.6 08/03/2021 1028  ? K 3.4 02/08/2017 0925  ? K 3.9 03/10/2016 1147  ? CL 104 08/03/2021 1028  ? CL 105 02/08/2017 0925  ? CO2 26 08/03/2021 1028  ? CO2 26 02/08/2017 0925  ? CO2 24 03/10/2016 1147  ? BUN 11 08/03/2021 1028  ? BUN 11 08/27/2020 1459  ? BUN 10 02/08/2017 0925  ? BUN 10.2 03/10/2016 1147  ? CREATININE 0.57 08/03/2021 1028  ? CREATININE 0.7 02/08/2017 0925  ? CREATININE 0.7 03/10/2016 1147  ?    ?Component Value Date/Time  ? CALCIUM 10.7 (H) 08/03/2021  1028  ? CALCIUM 10.4 (H) 02/08/2017 0925  ? CALCIUM 10.7 (H) 03/10/2016 1147  ? ALKPHOS 121 08/03/2021 1028  ? ALKPHOS 106 (H) 02/08/2017 0925  ? ALKPHOS 132 03/10/2016 1147  ?  AST 12 (L) 08/03/2021 1028  ? AST 12 03/10/2016 1147  ? ALT 9 08/03/2021 1028  ? ALT 16 02/08/2017 0925  ? ALT 8 03/10/2016 1147  ? BILITOT 0.6 08/03/2021 1028  ? BILITOT 0.63 03/10/2016 1147  ?  ? ? ?Impression and Plan: Ms. Lumadue is a very pleasant 51 yo caucasian female with a triple negative myeloproliferative syndrome though all work up has been negative so far.  ? ?I am glad that her blood counts are doing a little better.  I am glad that she is feeling a little bit better. ? ?Hopefully, we can get her back in Summer.  I would like to see her back after July 4. ? ?[name], MD ?5/15/202312:11 PM  ?

## 2021-08-10 DIAGNOSIS — H9193 Unspecified hearing loss, bilateral: Secondary | ICD-10-CM | POA: Diagnosis not present

## 2021-08-10 DIAGNOSIS — Z974 Presence of external hearing-aid: Secondary | ICD-10-CM | POA: Diagnosis not present

## 2021-08-11 ENCOUNTER — Other Ambulatory Visit: Payer: Self-pay | Admitting: Cardiology

## 2021-08-11 ENCOUNTER — Other Ambulatory Visit: Payer: Self-pay | Admitting: Family Medicine

## 2021-08-11 DIAGNOSIS — I10 Essential (primary) hypertension: Secondary | ICD-10-CM

## 2021-08-13 ENCOUNTER — Other Ambulatory Visit: Payer: Self-pay | Admitting: Cardiology

## 2021-08-15 ENCOUNTER — Other Ambulatory Visit: Payer: Self-pay | Admitting: Family Medicine

## 2021-08-15 DIAGNOSIS — K219 Gastro-esophageal reflux disease without esophagitis: Secondary | ICD-10-CM

## 2021-08-20 ENCOUNTER — Encounter: Payer: Medicare Other | Admitting: Family Medicine

## 2021-08-22 ENCOUNTER — Other Ambulatory Visit: Payer: Self-pay | Admitting: Cardiology

## 2021-08-24 NOTE — Telephone Encounter (Signed)
Rx refill sent to pharmacy. 

## 2021-09-16 ENCOUNTER — Other Ambulatory Visit: Payer: Self-pay | Admitting: Family Medicine

## 2021-09-16 DIAGNOSIS — D471 Chronic myeloproliferative disease: Secondary | ICD-10-CM

## 2021-09-16 DIAGNOSIS — E041 Nontoxic single thyroid nodule: Secondary | ICD-10-CM

## 2021-09-16 DIAGNOSIS — E039 Hypothyroidism, unspecified: Secondary | ICD-10-CM

## 2021-09-27 ENCOUNTER — Other Ambulatory Visit: Payer: Self-pay

## 2021-09-27 ENCOUNTER — Encounter (HOSPITAL_BASED_OUTPATIENT_CLINIC_OR_DEPARTMENT_OTHER): Payer: Self-pay | Admitting: Emergency Medicine

## 2021-09-27 ENCOUNTER — Emergency Department (HOSPITAL_BASED_OUTPATIENT_CLINIC_OR_DEPARTMENT_OTHER)
Admission: EM | Admit: 2021-09-27 | Discharge: 2021-09-27 | Disposition: A | Discharge status: Home / Self Care | Payer: Medicare Other | Attending: Emergency Medicine | Admitting: Emergency Medicine

## 2021-09-27 ENCOUNTER — Emergency Department (HOSPITAL_BASED_OUTPATIENT_CLINIC_OR_DEPARTMENT_OTHER): Payer: Medicare Other

## 2021-09-27 DIAGNOSIS — Z7982 Long term (current) use of aspirin: Secondary | ICD-10-CM | POA: Diagnosis not present

## 2021-09-27 DIAGNOSIS — M79662 Pain in left lower leg: Secondary | ICD-10-CM | POA: Diagnosis not present

## 2021-09-27 DIAGNOSIS — M79605 Pain in left leg: Secondary | ICD-10-CM | POA: Insufficient documentation

## 2021-09-27 DIAGNOSIS — M79604 Pain in right leg: Secondary | ICD-10-CM | POA: Insufficient documentation

## 2021-09-27 DIAGNOSIS — M79661 Pain in right lower leg: Secondary | ICD-10-CM | POA: Diagnosis not present

## 2021-09-27 NOTE — ED Provider Notes (Signed)
[facility] EMERGENCY DEPARTMENT Provider Note   CSN: [id number] Arrival date & time: 09/27/21  1733     History  Chief Complaint  Patient presents with   Leg Pain    Brianna Fletcher is a 51 y.o. female.  Presented to the emergency department due to concern for leg bruising and leg pain.  She said that she developed a bruise on her right leg a few days ago, has been able to walk without any difficulty.  Has had some intermittent pain in both of her legs.  Concerned about possibility of DVT in her legs.  She denies prior history of DVT.  Does report history of blood disorder.  I reviewed her chart to obtain additional history, reviewed last note from Dr. Marin Olp in January 2023, patient has history of myeloproliferative disorder.  Patient reports she has an appointment to see Dr. Marin Olp later this month.  HPI     Home Medications Prior to Admission medications   Medication Sig Start Date End Date Taking? Authorizing Provider  amLODipine (NORVASC) 10 MG tablet TAKE 1 TABLET BY MOUTH EVERY DAY 08/11/21   Carollee Herter, Alferd Apa, DO  Ascorbic Acid (VITAMIN C) 1000 MG tablet Take by mouth daily. Patient not taking: Reported on 08/03/2021    [provider]  aspirin 81 MG tablet Take 81 mg by mouth daily.    [provider]  chlorhexidine (PERIDEX) 0.12 % solution Use as directed 5 mLs in the mouth or throat daily. 01/14/20   [provider]  Cholecalciferol (VITAMIN D3 PO) Take 2,000 Int'l Units by mouth daily.    [provider]  ezetimibe (ZETIA) 10 MG tablet TAKE 1 TABLET BY MOUTH EVERY DAY 08/24/21   Richardo Priest, MD  furosemide (LASIX) 20 MG tablet TAKE 1 TABLET BY MOUTH EVERY DAY 03/05/21   Carollee Herter, Yvonne R, DO  KRILL OIL PO Take 1,200 mg by mouth daily.    [provider]  levothyroxine (SYNTHROID) 50 MCG tablet Take 1 tablet (50 mcg total) by mouth daily before breakfast. Pt needs office visit for further refills 09/16/21    Roma Schanz R, DO  Magnesium 500 MG CAPS Take by mouth daily.     [provider]  melatonin 5 MG TABS Take 5 mg by mouth at bedtime as needed. Patient not taking: Reported on 08/03/2021    [provider]  omeprazole (PRILOSEC) 20 MG capsule TAKE 1 CAPSULE BY MOUTH EVERY DAY 08/18/21   Roma Schanz R, DO  zinc gluconate 50 MG tablet Take 50 mg by mouth daily.    [provider]      Allergies    Lisinopril, Losartan, Lubiprostone, Pravachol [pravastatin sodium], and Repatha [evolocumab]    Review of Systems   Review of Systems  Constitutional:  Negative for chills and fever.  HENT:  Negative for ear pain and sore throat.   Eyes:  Negative for pain and visual disturbance.  Respiratory:  Negative for cough and shortness of breath.   Cardiovascular:  Negative for chest pain and palpitations.  Gastrointestinal:  Negative for abdominal pain and vomiting.  Genitourinary:  Negative for dysuria and hematuria.  Musculoskeletal:  Positive for arthralgias. Negative for back pain.  Skin:  Negative for color change and rash.  Neurological:  Negative for seizures and syncope.  All other systems reviewed and are negative.   Physical Exam Updated Vital Signs BP 134/67   Pulse 65   Temp 98.3 F (  36.8 C) (Oral)   Resp 16   Ht '5\' 6"'$  (1.676 m)   Wt 100.7 kg   LMP  (LMP Unknown)   SpO2 100%   BMI 35.83 kg/m  Physical Exam Vitals and nursing note reviewed.  Constitutional:      General: She is not in acute distress.    Appearance: She is well-developed.  HENT:     Head: Normocephalic and atraumatic.  Eyes:     Conjunctiva/sclera: Conjunctivae normal.  Cardiovascular:     Rate and Rhythm: Normal rate.     Pulses: Normal pulses.  Pulmonary:     Effort: Pulmonary effort is normal. No respiratory distress.  Abdominal:     Palpations: Abdomen is soft.     Tenderness: There is no abdominal tenderness.  Musculoskeletal:     Cervical back: Neck  supple.     Comments: Patient has some ecchymosis to her anterolateral lower leg, no significant swelling noted to either leg, DP and PT pulses intact bilaterally  Skin:    General: Skin is warm and dry.     Capillary Refill: Capillary refill takes less than 2 seconds.  Neurological:     Mental Status: She is alert.  Psychiatric:        Mood and Affect: Mood normal.     ED Results / Procedures / Treatments   Labs (all labs ordered are listed, but only abnormal results are displayed) Labs Reviewed - No data to display  EKG None  Radiology US Venous Img Lower Bilateral  Result Date: 09/27/2021 CLINICAL DATA:  Bilateral lower leg pain EXAM: Bilateral LOWER EXTREMITY VENOUS DOPPLER ULTRASOUND TECHNIQUE: Gray-scale sonography with compression, as well as color and duplex ultrasound, were performed to evaluate the deep venous system(s) from the level of the common femoral vein through the popliteal and proximal calf veins. COMPARISON:  None Available. FINDINGS: VENOUS Normal compressibility of the common femoral, superficial femoral, and popliteal veins, as well as the visualized calf veins. Visualized portions of profunda femoral vein and great saphenous vein unremarkable. No filling defects to suggest DVT on grayscale or color Doppler imaging. Doppler waveforms show normal direction of venous flow, normal respiratory plasticity and response to augmentation. OTHER None. Limitations: none IMPRESSION: Negative. Electronically Signed   By: Donavan Foil M.D.   On: 09/27/2021 19:26    Procedures Procedures    Medications Ordered in ED Medications - No data to display  ED Course/ Medical Decision Making/ A&P                           Medical Decision Making  51 year old lady presents to ER due to concern for bruise on her right lower leg and bilateral leg pain.  Her legs appear normal, there is a modest bruise to her right leg.  No overlying erythema to suggest infectious process.  Due to  her pain, check DVT study.  No DVT noted.  Patient reports having follow-up with her hematologist in a couple weeks.  Given her well appearance and no systemic symptoms, do not feel she needs blood work or other acute work-up today.  Reviewed return precautions and discharged.  After the discussed management above, the patient was determined to be safe for discharge.  The patient was in agreement with this plan and all questions regarding their care were answered.  ED return precautions were discussed and the patient will return to the ED with any significant worsening of condition.  Final Clinical Impression(s) / ED Diagnoses Final diagnoses:  Pain in both lower extremities    Rx / DC Orders ED Discharge Orders     None         Lucrezia Starch, MD 09/27/21 2138

## 2021-09-27 NOTE — Discharge Instructions (Signed)
Follow-up with your hematologist and your primary care doctor.  Come back to ER as needed.

## 2021-09-27 NOTE — ED Triage Notes (Signed)
Pt arrives pov, steady gait, c/o painful bruise on RLE x 3 days, denies injury.  Pt endorses bilateral leg pain, endorses concern for DVT

## 2021-10-08 ENCOUNTER — Ambulatory Visit (INDEPENDENT_AMBULATORY_CARE_PROVIDER_SITE_OTHER): Payer: Medicare Other | Admitting: Cardiology

## 2021-10-08 ENCOUNTER — Encounter: Payer: Self-pay | Admitting: Cardiology

## 2021-10-08 VITALS — BP 130/80 | HR 52 | Ht 66.0 in | Wt 221.0 lb

## 2021-10-08 DIAGNOSIS — I2510 Atherosclerotic heart disease of native coronary artery without angina pectoris: Secondary | ICD-10-CM

## 2021-10-08 DIAGNOSIS — R931 Abnormal findings on diagnostic imaging of heart and coronary circulation: Secondary | ICD-10-CM | POA: Diagnosis not present

## 2021-10-08 DIAGNOSIS — Z789 Other specified health status: Secondary | ICD-10-CM | POA: Diagnosis not present

## 2021-10-08 DIAGNOSIS — E782 Mixed hyperlipidemia: Secondary | ICD-10-CM

## 2021-10-08 NOTE — Patient Instructions (Signed)
Medication Instructions:  Your physician recommends that you continue on your current medications as directed. Please refer to the Current Medication list given to you today.  *If you need a refill on your cardiac medications before your next appointment, please call your pharmacy*   Lab Work: Your physician recommends that you return for lab work in:   Labs today: Lipids, CMP, LPa  If you have labs (blood work) drawn today and your tests are completely normal, you will receive your results only by: [place] (if you have [facility]) OR A paper copy in the mail If you have any lab test that is abnormal or we need to change your treatment, we will call you to review the results.   Testing/Procedures: None   Follow-Up: At [facility], you and your health needs are our priority.  As part of our continuing mission to provide you with exceptional heart care, we have created designated Provider Care Teams.  These Care Teams include your primary Cardiologist (physician) and Advanced Practice Providers (APPs -  Physician Assistants and Nurse Practitioners) who all work together to provide you with the care you need, when you need it.  We recommend signing up for the patient portal called "MyChart".  Sign up information is provided on this After Visit Summary.  MyChart is used to connect with patients for Virtual Visits (Telemedicine).  Patients are able to view lab/test results, encounter notes, upcoming appointments, etc.  Non-urgent messages can be sent to your provider as well.   To learn more about what you can do with MyChart, go to [email or web address].    Your next appointment:   1 year(s)  The format for your next appointment:   In Person  Provider:   Shirlee More, MD    Other Instructions None  Important Information About Sugar

## 2021-10-08 NOTE — Progress Notes (Signed)
Cardiology Office Note:    Date:  10/08/2021   ID:  Brianna Fletcher, DOB 1970-04-27, MRN [id number]  PCP:  Ann Held, DO  Cardiologist:  Shirlee More, MD    Referring MD: Carollee Herter, Alferd Apa, *    ASSESSMENT:    1. Agatston coronary artery calcium score between 200 and 399   2. Mild CAD   3. Mixed hyperlipidemia   4. Statin intolerance    PLAN:    In order of problems listed above:  She is doing well with her mild CAD not having anginal discomfort EKG is normal she is reassured by my opinion does not need to repeat coronary angiography continue aspirin Zetia antihypertensive amlodipine. Recheck lipids additional therapy as needed bempedoic acid If edema continue to be problematic may benefit from switching to an ARB if she takes a calcium channel blocker   Next appointment: 1 year   Medication Adjustments/Labs and Tests Ordered: Current medicines are reviewed at length with the patient today.  Concerns regarding medicines are outlined above.  No orders of the defined types were placed in this encounter.  No orders of the defined types were placed in this encounter.   Chief Complaint  Patient presents with   Follow-up    History of Present Illness:    Brianna Fletcher is a 51 y.o. female with a hx of mild CAD on cardiac CTA less than 25% right coronary artery and left circumflex coronary artery and 24 to 49% proximal LAD with a calcium score of 244 hypertension hyperlipidemia myeloproliferative disorder and statin intolerance left circumflex last seen 12/24/2020 recovered from COVID-19 outpatient infection.  Compliance with diet, lifestyle and medications: Yes  She has to be seen today she has had some vague flank back discomfort no chest pain was concerned whether she may have had a cardiac event her EKG is normal but unfortunately some of the other medical interactions that EKGs are unreliable and she is wondering if she needs to repeat her cardiac CTA.   Her symptoms are nonanginal and her EKG is normal and I told her I do not think is necessary at this time.  She is also concerned because of chronic dependent edema takes a low-dose of a loop diuretic.  She has had no angina shortness of breath palpitation or syncope.  She was intolerant of statin and PCSK9 inhibitor tolerates Zetia we will recheck her labs fasting if she requires additional therapy we will use bempedoic acid   Past Medical History:  Diagnosis Date   Arthritis    Back pain    Blood dyscrasia    produces too many WBCs   Chronic neutrophilia 11/15/2012   Depression    Edema, lower extremity    Family history of heart disease    mother had stents placed in her 59s and had myocardial infarctions   Fibromyalgia    GERD (gastroesophageal reflux disease)    Glaucoma    Glaucoma    High cholesterol    Hypertension    IBS (irritable bowel syndrome)    Joint pain    Leukocytosis, unspecified 11/15/2012   Monocytosis 11/15/2012   Myeloproliferative disorder ([place]) 03/08/2013   Precordial chest pain    Pruritus 11/15/2012   Radial artery occlusion, right (HCC)    status post  right radial artery cardiac catheterization   Thyroid disease    Vaginal delivery 1986, 2000   Vitamin D deficiency     Past Surgical History:  Procedure Laterality  Date   CARDIAC CATHETERIZATION N/A 10/03/2014   Procedure: Left Heart Cath and Coronary Angiography;  Surgeon: Lorretta Harp, MD;  Location: [facility];  Service: Cardiovascular;  Laterality: N/A;   DILITATION & CURRETTAGE/HYSTROSCOPY WITH NOVASURE ABLATION N/A 11/08/2014   Procedure: DILATATION & CURETTAGE/HYSTEROSCOPY WITH NOVASURE ABLATION;  Surgeon: Linda Hedges, DO;  Location: [facility];  Service: Gynecology;  Laterality: N/A;   EYE SURGERY     laser eye surgery   LAPAROSCOPIC VAGINAL HYSTERECTOMY WITH SALPINGECTOMY Bilateral 03/05/2015   Procedure: LAPAROSCOPIC ASSISTED VAGINAL HYSTERECTOMY WITH SALPINGECTOMY;  Surgeon: Linda Hedges, DO;  Location: [facility];  Service: Gynecology;  Laterality: Bilateral;   TUBAL LIGATION  2000    Current Medications: Current Meds  Medication Sig   amLODipine (NORVASC) 10 MG tablet TAKE 1 TABLET BY MOUTH EVERY DAY   aspirin 81 MG tablet Take 81 mg by mouth daily.   chlorhexidine (PERIDEX) 0.12 % solution Use as directed 5 mLs in the mouth or throat daily.   Cholecalciferol (VITAMIN D3 PO) Take 2,000 Int'l Units by mouth daily.   ezetimibe (ZETIA) 10 MG tablet TAKE 1 TABLET BY MOUTH EVERY DAY   furosemide (LASIX) 20 MG tablet TAKE 1 TABLET BY MOUTH EVERY DAY   KRILL OIL PO Take 1,200 mg by mouth daily.   levothyroxine (SYNTHROID) 50 MCG tablet Take 1 tablet (50 mcg total) by mouth daily before breakfast. Pt needs office visit for further refills   Magnesium 500 MG CAPS Take by mouth daily.    melatonin 5 MG TABS Take 5 mg by mouth at bedtime as needed.   omeprazole (PRILOSEC) 20 MG capsule TAKE 1 CAPSULE BY MOUTH EVERY DAY   zinc gluconate 50 MG tablet Take 50 mg by mouth daily.     Allergies:   Lisinopril, Losartan, Lubiprostone, Pravachol [pravastatin sodium], and Repatha [evolocumab]   Social History   Socioeconomic History   Marital status: Divorced    Spouse name: Not on file   Number of children: Not on file   Years of education: Not on file   Highest education level: Not on file  Occupational History   Occupation: unemployed  Tobacco Use   Smoking status: Never   Smokeless tobacco: Never   Tobacco comments:    08/03/2021 Never smoked.  Vaping Use   Vaping Use: Never used  Substance and Sexual Activity   Alcohol use: Not Currently    Alcohol/week: 6.0 standard drinks of alcohol    Types: 6 Cans of beer per week   Drug use: No   Sexual activity: Not Currently    Partners: Male  Other Topics Concern   Not on file  Social History Narrative   Very little exercise   Social Determinants of Health   Financial Resource Strain: Low Risk  (12/10/2020)   Overall  Financial Resource Strain (CARDIA)    Difficulty of Paying Living Expenses: Not hard at all  Food Insecurity: No Food Insecurity (12/10/2020)   Hunger Vital Sign    Worried About Running Out of Food in the Last Year: Never true    Ran Out of Food in the Last Year: Never true  Transportation Needs: No Transportation Needs (12/10/2020)   PRAPARE - Hydrologist (Medical): No    Lack of Transportation (Non-Medical): No  Physical Activity: Inactive (12/10/2020)   Exercise Vital Sign    Days of Exercise per Week: 0 days    Minutes of Exercise per Session: 0 min  Stress: No Stress Concern Present (12/10/2020)   [place]    Feeling of Stress : Only a little  Social Connections: Moderately Isolated (12/10/2020)   Social Connection and Isolation Panel [NHANES]    Frequency of Communication with Friends and Family: More than three times a week    Frequency of Social Gatherings with Friends and Family: More than three times a week    Attends Religious Services: Never    Marine scientist or Organizations: No    Attends Music therapist: Never    Marital Status: Living with partner     Family History: The patient's family history includes Alcoholism in her father; Cancer in her father; Dementia in her maternal grandmother; Depression in her mother; Diabetes in her father; Heart disease in her cousin, maternal grandmother, and mother; Heart disease (age of onset: 66) in her maternal grandfather; Hyperlipidemia in her mother; Hypertension in her mother; Obesity in her mother; Sleep apnea in her mother. There is no history of Other. ROS:   Please see the history of present illness.    All other systems reviewed and are negative.  EKGs/Labs/Other Studies Reviewed:    The following studies were reviewed today:  EKG:  EKG ordered today and personally reviewed.  The ekg ordered today  demonstrates sinus rhythm and her EKG is normal  Recent Labs: 02/17/2021: TSH 0.78 08/03/2021: ALT 9; BUN 11; Creatinine 0.57; Hemoglobin 12.9; Platelet Count 274; Potassium 3.6; Sodium 138  Recent Lipid Panel    Component Value Date/Time   CHOL 178 02/17/2021 1118   CHOL 214 (H) 12/24/2020 1419   TRIG 113.0 02/17/2021 1118   HDL 54.60 02/17/2021 1118   HDL 45 12/24/2020 1419   CHOLHDL 3 02/17/2021 1118   VLDL 22.6 02/17/2021 1118   LDLCALC 101 (H) 02/17/2021 1118   LDLCALC 123 (H) 12/24/2020 1419   LDLDIRECT 167.0 01/31/2020 1121    Physical Exam:    VS:  BP 130/80 (BP Location: Left Arm, Patient Position: Sitting, Cuff Size: Normal)   Pulse (!) 52   Ht '5\' 6"'$  (1.676 m)   Wt 221 lb (100.2 kg)   LMP  (LMP Unknown)   SpO2 98%   BMI 35.67 kg/m     Wt Readings from Last 3 Encounters:  10/08/21 221 lb (100.2 kg)  09/27/21 222 lb (100.7 kg)  08/03/21 228 lb 1.9 oz (103.5 kg)     GEN:  Well nourished, well developed in no acute distress HEENT: Normal NECK: No JVD; No carotid bruits LYMPHATICS: No lymphadenopathy CARDIAC: RRR, no murmurs, rubs, gallops RESPIRATORY:  Clear to auscultation without rales, wheezing or rhonchi  ABDOMEN: Soft, non-tender, non-distended MUSCULOSKELETAL:  No edema; No deformity  SKIN: Warm and dry NEUROLOGIC:  Alert and oriented x 3 PSYCHIATRIC:  Normal affect    Signed, Shirlee More, MD  10/08/2021 1:40 PM    [facility]

## 2021-10-09 ENCOUNTER — Other Ambulatory Visit: Payer: Self-pay

## 2021-10-09 ENCOUNTER — Inpatient Hospital Stay: Payer: Medicare Other | Attending: Hematology & Oncology

## 2021-10-09 ENCOUNTER — Encounter: Payer: Self-pay | Admitting: Hematology & Oncology

## 2021-10-09 ENCOUNTER — Inpatient Hospital Stay (HOSPITAL_BASED_OUTPATIENT_CLINIC_OR_DEPARTMENT_OTHER): Payer: Medicare Other | Admitting: Hematology & Oncology

## 2021-10-09 VITALS — BP 126/59 | HR 57 | Temp 98.2°F | Resp 18 | Wt 222.0 lb

## 2021-10-09 DIAGNOSIS — D471 Chronic myeloproliferative disease: Secondary | ICD-10-CM

## 2021-10-09 DIAGNOSIS — E039 Hypothyroidism, unspecified: Secondary | ICD-10-CM | POA: Diagnosis not present

## 2021-10-09 LAB — COMPREHENSIVE METABOLIC PANEL
ALT: 9 IU/L (ref 0–32)
AST: 14 IU/L (ref 0–40)
Albumin/Globulin Ratio: 1.7 (ref 1.2–2.2)
Albumin: 4.8 g/dL (ref 3.8–4.9)
Alkaline Phosphatase: 163 IU/L — ABNORMAL HIGH (ref 44–121)
BUN/Creatinine Ratio: 13 (ref 9–23)
BUN: 9 mg/dL (ref 6–24)
Bilirubin Total: 0.6 mg/dL (ref 0.0–1.2)
CO2: 23 mmol/L (ref 20–29)
Calcium: 11.3 mg/dL — ABNORMAL HIGH (ref 8.7–10.2)
Chloride: 101 mmol/L (ref 96–106)
Creatinine, Ser: 0.67 mg/dL (ref 0.57–1.00)
Globulin, Total: 2.9 g/dL (ref 1.5–4.5)
Glucose: 95 mg/dL (ref 70–99)
Potassium: 4.3 mmol/L (ref 3.5–5.2)
Sodium: 138 mmol/L (ref 134–144)
Total Protein: 7.7 g/dL (ref 6.0–8.5)
eGFR: 104 mL/min/{1.73_m2} (ref >59)

## 2021-10-09 LAB — CBC WITH DIFFERENTIAL (CANCER CENTER ONLY)
Abs Immature Granulocytes: 0.3 10*3/uL — ABNORMAL HIGH (ref 0.00–0.07)
Basophils Absolute: 0.1 10*3/uL (ref 0.0–0.1)
Basophils Relative: 0 %
Eosinophils Absolute: 0.1 10*3/uL (ref 0.0–0.5)
Eosinophils Relative: 1 %
HCT: 43.5 % (ref 36.0–46.0)
Hemoglobin: 14.3 g/dL (ref 12.0–15.0)
Immature Granulocytes: 2 %
Lymphocytes Relative: 16 %
Lymphs Abs: 2.7 10*3/uL (ref 0.7–4.0)
MCH: 30.6 pg (ref 26.0–34.0)
MCHC: 32.9 g/dL (ref 30.0–36.0)
MCV: 93.1 fL (ref 80.0–100.0)
Monocytes Absolute: 0.9 10*3/uL (ref 0.1–1.0)
Monocytes Relative: 5 %
Neutro Abs: 13.3 10*3/uL — ABNORMAL HIGH (ref 1.7–7.7)
Neutrophils Relative %: 76 %
Platelet Count: 310 10*3/uL (ref 150–400)
RBC: 4.67 MIL/uL (ref 3.87–5.11)
RDW: 12.4 % (ref 11.5–15.5)
WBC Count: 17.3 10*3/uL — ABNORMAL HIGH (ref 4.0–10.5)
nRBC: 0 % (ref 0.0–0.2)

## 2021-10-09 LAB — CMP (CANCER CENTER ONLY)
ALT: 9 U/L (ref 0–44)
AST: 13 U/L — ABNORMAL LOW (ref 15–41)
Albumin: 5.1 g/dL — ABNORMAL HIGH (ref 3.5–5.0)
Alkaline Phosphatase: 135 U/L — ABNORMAL HIGH (ref 38–126)
Anion gap: 8 (ref 5–15)
BUN: 9 mg/dL (ref 6–20)
CO2: 26 mmol/L (ref 22–32)
Calcium: 11.2 mg/dL — ABNORMAL HIGH (ref 8.9–10.3)
Chloride: 103 mmol/L (ref 98–111)
Creatinine: 0.61 mg/dL (ref 0.44–1.00)
GFR, Estimated: >60 mL/min (ref >60)
Glucose, Bld: 103 mg/dL — ABNORMAL HIGH (ref 70–99)
Potassium: 3.8 mmol/L (ref 3.5–5.1)
Sodium: 137 mmol/L (ref 135–145)
Total Bilirubin: 0.7 mg/dL (ref 0.3–1.2)
Total Protein: 8 g/dL (ref 6.5–8.1)

## 2021-10-09 LAB — LIPID PANEL
Chol/HDL Ratio: 4.4 ratio (ref 0.0–4.4)
Cholesterol, Total: 209 mg/dL — ABNORMAL HIGH (ref 100–199)
HDL: 47 mg/dL (ref >39)
LDL Chol Calc (NIH): 127 mg/dL — ABNORMAL HIGH (ref 0–99)
Triglycerides: 198 mg/dL — ABNORMAL HIGH (ref 0–149)
VLDL Cholesterol Cal: 35 mg/dL (ref 5–40)

## 2021-10-09 LAB — SAVE SMEAR(SSMR), FOR PROVIDER SLIDE REVIEW

## 2021-10-09 LAB — LACTATE DEHYDROGENASE: LDH: 146 U/L (ref 98–192)

## 2021-10-09 LAB — LIPOPROTEIN A (LPA): Lipoprotein (a): 89.8 nmol/L — ABNORMAL HIGH (ref <75.0)

## 2021-10-09 NOTE — Progress Notes (Signed)
Hematology and Oncology Follow Up Visit  Brianna Fletcher [id number] April 02, 1970 51 y.o. 10/09/2021   Principle Diagnosis:  Chronic myeloproliferative syndrome-Triple negative Sub-clinical hypothyroidism (+) p-ANCA  Current Therapy:   Peg-Interferon q weekly - q 4 week dosing - On hold due to cost Synthroid 0.050 mg po q day   Interim History: Brianna Fletcher is here today for follow-up.  Fortunately, her mom is in a nursing home right now.  However, this has not challenges for Brianna Fletcher.  She is doing her best to try to keep her mom in the nursing home.  The good news is that it sounds like both grandchildren will be coming to visit.  One is coming from [place].  The other is coming from [place].  This is going to be the first time that I think she will see them.  This is going be a big time for her.  Otherwise, she seems to be doing about the same.  She has a lipoma in the upper abdomen.  This was confirmed by ultrasound last year.  She sent me a picture of a bruise that she had on her right lower leg.  She takes aspirin and fish oil.  This might be the cause for this.  She has had no bleeding.  She has had no change in bowel or bladder habits.  She has had no obvious rashes.  She has had no swollen lymph nodes.  Overall, I would say performance status is probably ECOG 1.      Medications:  Allergies as of 10/09/2021       Reactions   Lisinopril Cough   Losartan Cough   Lubiprostone Nausea And Vomiting   Pravachol [pravastatin Sodium] Other (See Comments)   "Causes pain in my body"   Repatha [evolocumab] Cough   Severe cough that would not respond to any treatment - per patient.        Medication List        Accurate as of October 09, 2021 11:18 AM. If you have any questions, ask your nurse or doctor.          amLODipine 10 MG tablet Commonly known as: NORVASC TAKE 1 TABLET BY MOUTH EVERY DAY   aspirin 81 MG tablet Take 81 mg by mouth daily.   chlorhexidine  0.12 % solution Commonly known as: PERIDEX Use as directed 5 mLs in the mouth or throat daily.   ezetimibe 10 MG tablet Commonly known as: ZETIA TAKE 1 TABLET BY MOUTH EVERY DAY   furosemide 20 MG tablet Commonly known as: LASIX TAKE 1 TABLET BY MOUTH EVERY DAY   KRILL OIL PO Take 1,200 mg by mouth daily.   levothyroxine 50 MCG tablet Commonly known as: SYNTHROID Take 1 tablet (50 mcg total) by mouth daily before breakfast. Pt needs office visit for further refills   Magnesium 500 MG Caps Take by mouth daily.   melatonin 5 MG Tabs Take 5 mg by mouth at bedtime as needed.   omeprazole 20 MG capsule Commonly known as: PRILOSEC TAKE 1 CAPSULE BY MOUTH EVERY DAY   VITAMIN D3 PO Take 2,000 Int'l Units by mouth daily.   zinc gluconate 50 MG tablet Take 50 mg by mouth daily.        Allergies:  Allergies  Allergen Reactions   Lisinopril Cough   Losartan Cough   Lubiprostone Nausea And Vomiting   Pravachol [Pravastatin Sodium] Other (See Comments)    "Causes pain in my body"  Repatha [Evolocumab] Cough    Severe cough that would not respond to any treatment - per patient.    Past Medical History, Surgical history, Social history, and Family History were reviewed and updated.  Review of Systems: Review of Systems  Constitutional:  Positive for malaise/fatigue.  HENT: Negative.    Eyes: Negative.   Respiratory: Negative.    Cardiovascular: Negative.   Gastrointestinal:  Positive for diarrhea.  Genitourinary: Negative.   Musculoskeletal:  Positive for myalgias.  Skin: Negative.   Neurological:  Positive for dizziness.  Psychiatric/Behavioral: Negative.       Physical Exam:  weight is 222 lb (100.7 kg). Her oral temperature is 98.2 F (36.8 C). Her blood pressure is 126/59 (abnormal) and her pulse is 57 (abnormal). Her respiration is 18 and oxygen saturation is 100%.   Wt Readings from Last 3 Encounters:  10/09/21 222 lb (100.7 kg)  10/08/21 221 lb  (100.2 kg)  09/27/21 222 lb (100.7 kg)    Physical Exam Vitals reviewed.  HENT:     Head: Normocephalic and atraumatic.  Eyes:     Pupils: Pupils are equal, round, and reactive to light.  Cardiovascular:     Rate and Rhythm: Normal rate and regular rhythm.     Heart sounds: Normal heart sounds.  Pulmonary:     Effort: Pulmonary effort is normal.     Breath sounds: Normal breath sounds.  Abdominal:     General: Bowel sounds are normal.     Palpations: Abdomen is soft.  Musculoskeletal:        General: No tenderness or deformity. Normal range of motion.     Cervical back: Normal range of motion.  Lymphadenopathy:     Cervical: No cervical adenopathy.  Skin:    General: Skin is warm and dry.     Findings: No erythema or rash.  Neurological:     Mental Status: She is alert and oriented to person, place, and time.  Psychiatric:        Behavior: Behavior normal.        Thought Content: Thought content normal.        Judgment: Judgment normal.   Lab Results  Component Value Date   WBC 17.3 (H) 10/09/2021   HGB 14.3 10/09/2021   HCT 43.5 10/09/2021   MCV 93.1 10/09/2021   PLT 310 10/09/2021   Lab Results  Component Value Date   FERRITIN 60 08/03/2021   IRON 89 08/03/2021   TIBC 350 08/03/2021   UIBC 261 08/03/2021   IRONPCTSAT 25 08/03/2021   Lab Results  Component Value Date   RETICCTPCT 2.1 04/12/2017   RBC 4.67 10/09/2021   RETICCTABS 116.1 11/20/2013   No results found for: "KPAFRELGTCHN", "LAMBDASER", "KAPLAMBRATIO" No results found for: "IGGSERUM", "IGA", "IGMSERUM" No results found for: "TOTALPROTELP", "ALBUMINELP", "A1GS", "A2GS", "BETS", "BETA2SER", "GAMS", "MSPIKE", "SPEI"   Chemistry      Component Value Date/Time   NA 137 10/09/2021 1015   NA 138 10/08/2021 1415   NA 140 02/08/2017 0925   NA 136 03/10/2016 1147   K 3.8 10/09/2021 1015   K 3.4 02/08/2017 0925   K 3.9 03/10/2016 1147   CL 103 10/09/2021 1015   CL 105 02/08/2017 0925   CO2 26  10/09/2021 1015   CO2 26 02/08/2017 0925   CO2 24 03/10/2016 1147   BUN 9 10/09/2021 1015   BUN 9 10/08/2021 1415   BUN 10 02/08/2017 0925   BUN 10.2 03/10/2016 1147   CREATININE  0.61 10/09/2021 1015   CREATININE 0.7 02/08/2017 0925   CREATININE 0.7 03/10/2016 1147      Component Value Date/Time   CALCIUM 11.2 (H) 10/09/2021 1015   CALCIUM 10.4 (H) 02/08/2017 0925   CALCIUM 10.7 (H) 03/10/2016 1147   ALKPHOS 135 (H) 10/09/2021 1015   ALKPHOS 106 (H) 02/08/2017 0925   ALKPHOS 132 03/10/2016 1147   AST 13 (L) 10/09/2021 1015   AST 12 03/10/2016 1147   ALT 9 10/09/2021 1015   ALT 16 02/08/2017 0925   ALT 8 03/10/2016 1147   BILITOT 0.7 10/09/2021 1015   BILITOT 0.63 03/10/2016 1147      Impression and Plan: Brianna Fletcher is a very pleasant 50 yo caucasian female with a triple negative myeloproliferative syndrome though all work up has been negative so far.   I am just happy that her grandchildren will come to visit.  They are young.  I know she is so excited to be able to see them in person.  Otherwise, we will plan to get her back in 3 months.  I really do not see a problem with this.  I do not see that this myeloproliferative process that she has is active.   [name], MD 7/21/202311:18 AM

## 2021-10-12 DIAGNOSIS — H905 Unspecified sensorineural hearing loss: Secondary | ICD-10-CM | POA: Diagnosis not present

## 2021-10-13 ENCOUNTER — Telehealth: Payer: Self-pay | Admitting: Cardiology

## 2021-10-13 NOTE — Telephone Encounter (Signed)
Left message for the patient to call back.

## 2021-10-13 NOTE — Telephone Encounter (Signed)
Patient is requesting a call back to discuss lab results. 

## 2021-10-16 ENCOUNTER — Other Ambulatory Visit: Payer: Self-pay

## 2021-10-16 ENCOUNTER — Telehealth: Payer: Self-pay | Admitting: Family Medicine

## 2021-10-16 ENCOUNTER — Other Ambulatory Visit: Payer: Self-pay | Admitting: Cardiology

## 2021-10-16 DIAGNOSIS — E782 Mixed hyperlipidemia: Secondary | ICD-10-CM

## 2021-10-16 DIAGNOSIS — R600 Localized edema: Secondary | ICD-10-CM

## 2021-10-16 MED ORDER — FUROSEMIDE 20 MG PO TABS: 20.0000 mg | ORAL_TABLET | Freq: Every day | ORAL | 0 refills | Status: DC

## 2021-10-16 MED ORDER — BEMPEDOIC ACID 180 MG PO TABS: 180.0000 mg | ORAL_TABLET | ORAL | 3 refills | Status: DC

## 2021-10-16 NOTE — Telephone Encounter (Signed)
PA submitted on CMM for Nexletol. Key [id number]

## 2021-10-16 NOTE — Telephone Encounter (Signed)
Patient informed of results. Patient was agreeable to starting the Bempedoic acid medication and also having a lipid profile drawn in 6 weeks. The medication was ordered and sent to the patients pharmacy and the lab work was ordered via [employer]. Patient had no further questions at this time.

## 2021-10-16 NOTE — Telephone Encounter (Signed)
Medication:   furosemide (LASIX) 20 MG tablet [[id number]]   Has the patient contacted their pharmacy? Yes.   (If no, request that the patient contact the pharmacy for the refill.) (If yes, when and what did the pharmacy advise?)  Sent request with no response. Contact PCP  Preferred Pharmacy (with phone number or street name):   [id number] [place] [id number] Phone:  [phone]  Fax:  [phone]  Agent: Please be advised that RX refills may take up to 3 business days. We ask that you follow-up with your pharmacy.

## 2021-10-16 NOTE — Telephone Encounter (Signed)
Refill sent.

## 2021-10-28 ENCOUNTER — Encounter (INDEPENDENT_AMBULATORY_CARE_PROVIDER_SITE_OTHER): Payer: Self-pay

## 2021-11-04 ENCOUNTER — Other Ambulatory Visit: Payer: Self-pay | Admitting: Cardiology

## 2021-11-23 ENCOUNTER — Other Ambulatory Visit: Payer: Self-pay | Admitting: Family Medicine

## 2021-11-23 DIAGNOSIS — E039 Hypothyroidism, unspecified: Secondary | ICD-10-CM

## 2021-11-23 DIAGNOSIS — D471 Chronic myeloproliferative disease: Secondary | ICD-10-CM

## 2021-11-23 DIAGNOSIS — E041 Nontoxic single thyroid nodule: Secondary | ICD-10-CM

## 2021-11-28 ENCOUNTER — Other Ambulatory Visit: Payer: Self-pay | Admitting: Family Medicine

## 2021-11-28 DIAGNOSIS — E041 Nontoxic single thyroid nodule: Secondary | ICD-10-CM

## 2021-11-28 DIAGNOSIS — E039 Hypothyroidism, unspecified: Secondary | ICD-10-CM

## 2021-11-28 DIAGNOSIS — D471 Chronic myeloproliferative disease: Secondary | ICD-10-CM

## 2021-12-04 ENCOUNTER — Telehealth: Payer: Self-pay | Admitting: Family Medicine

## 2021-12-04 DIAGNOSIS — D471 Chronic myeloproliferative disease: Secondary | ICD-10-CM

## 2021-12-04 DIAGNOSIS — E041 Nontoxic single thyroid nodule: Secondary | ICD-10-CM

## 2021-12-04 DIAGNOSIS — E039 Hypothyroidism, unspecified: Secondary | ICD-10-CM

## 2021-12-04 MED ORDER — LEVOTHYROXINE SODIUM 50 MCG PO TABS: 50.0000 ug | ORAL_TABLET | Freq: Every day | ORAL | 0 refills | Status: DC

## 2021-12-04 NOTE — Telephone Encounter (Signed)
Medication: levothyroxine (SYNTHROID) 50 MCG tablet  Has the patient contacted their pharmacy? No.   Preferred Pharmacy:  [id number] [place], [place] [address] Phone:  [phone]  Fax:  [phone]

## 2021-12-04 NOTE — Telephone Encounter (Signed)
Rx sent 

## 2021-12-08 DIAGNOSIS — D2239 Melanocytic nevi of other parts of face: Secondary | ICD-10-CM | POA: Diagnosis not present

## 2021-12-08 DIAGNOSIS — L814 Other melanin hyperpigmentation: Secondary | ICD-10-CM | POA: Diagnosis not present

## 2021-12-08 DIAGNOSIS — D225 Melanocytic nevi of trunk: Secondary | ICD-10-CM | POA: Diagnosis not present

## 2021-12-08 DIAGNOSIS — L821 Other seborrheic keratosis: Secondary | ICD-10-CM | POA: Diagnosis not present

## 2021-12-10 DIAGNOSIS — Z789 Other specified health status: Secondary | ICD-10-CM

## 2021-12-10 NOTE — Progress Notes (Signed)
[facility])                                            [facility] Team                                        Statin Quality Measure Assessment    12/10/2021  SAE HANDRICH 10-25-1970 [id number]  Per review of chart and payor information, this patient has been flagged for non-adherence to the following CMS Quality Measure:   '[]'$  Statin Use in Persons with Diabetes  '[x]'$  Statin Use in Persons with Cardiovascular Disease  - H/o pravastatin causing body pain. Zetia on file. Next appointment with Dr. Cheri Rous is on 12/14/2021. If deemed clinically appropriate, please consider associating exclusion code at the next office visit.   The 10-year ASCVD risk score (Arnett DK, et al., 2019) is: 2.7%   Values used to calculate the score:     Age: 18 years     Sex: Female     Is Non-Hispanic African American: No     Diabetic: No     Tobacco smoker: No     Systolic Blood Pressure: 939 mmHg     Is BP treated: Yes     HDL Cholesterol: 47 mg/dL     Total Cholesterol: 209 mg/dL    Please consider ONE of the following recommendations:   Initiate high intensity statin Atorvastatin '40mg'$  once daily, #90, 3 refills   Rosuvastatin '20mg'$  once daily, #90, 3 refills    Initiate moderate intensity          statin with reduced frequency if prior          statin intolerance 1x weekly, #13, 3 refills   2x weekly, #26, 3 refills   3x weekly, #39, 3 refills   Code for past statin intolerance or other exclusions (required annually)  Drug Induced Myopathy G72.0   Myositis, unspecified M60.9   Rhabdomyolysis M62.82   Cirrhosis of liver K74.69   Biliary cirrhosis, unspecified K74.5   Abnormal blood glucose - for SUPD ONLY R73.09   Prediabetes - for SUPD ONLY  R73.03   Thank you for your time,  Kristeen Miss, [place] Cell: [phone]

## 2021-12-14 ENCOUNTER — Ambulatory Visit: Payer: Medicare Other | Admitting: Family Medicine

## 2021-12-21 DIAGNOSIS — J069 Acute upper respiratory infection, unspecified: Secondary | ICD-10-CM | POA: Diagnosis not present

## 2021-12-23 DIAGNOSIS — R051 Acute cough: Secondary | ICD-10-CM | POA: Diagnosis not present

## 2021-12-23 DIAGNOSIS — R509 Fever, unspecified: Secondary | ICD-10-CM | POA: Diagnosis not present

## 2021-12-23 DIAGNOSIS — J069 Acute upper respiratory infection, unspecified: Secondary | ICD-10-CM | POA: Diagnosis not present

## 2021-12-23 DIAGNOSIS — B9789 Other viral agents as the cause of diseases classified elsewhere: Secondary | ICD-10-CM | POA: Diagnosis not present

## 2021-12-24 ENCOUNTER — Telehealth: Payer: Medicare Other | Admitting: Family Medicine

## 2021-12-31 DIAGNOSIS — B9789 Other viral agents as the cause of diseases classified elsewhere: Secondary | ICD-10-CM | POA: Diagnosis not present

## 2021-12-31 DIAGNOSIS — R509 Fever, unspecified: Secondary | ICD-10-CM | POA: Diagnosis not present

## 2021-12-31 DIAGNOSIS — R051 Acute cough: Secondary | ICD-10-CM | POA: Diagnosis not present

## 2022-01-03 ENCOUNTER — Other Ambulatory Visit: Payer: Self-pay | Admitting: Family Medicine

## 2022-01-03 DIAGNOSIS — E039 Hypothyroidism, unspecified: Secondary | ICD-10-CM

## 2022-01-03 DIAGNOSIS — D471 Chronic myeloproliferative disease: Secondary | ICD-10-CM

## 2022-01-03 DIAGNOSIS — E041 Nontoxic single thyroid nodule: Secondary | ICD-10-CM

## 2022-01-04 ENCOUNTER — Encounter: Payer: Self-pay | Admitting: *Deleted

## 2022-01-08 ENCOUNTER — Inpatient Hospital Stay: Payer: Medicare Other | Attending: Hematology & Oncology

## 2022-01-08 ENCOUNTER — Inpatient Hospital Stay: Payer: Medicare Other | Admitting: Hematology & Oncology

## 2022-01-19 DIAGNOSIS — H5203 Hypermetropia, bilateral: Secondary | ICD-10-CM | POA: Diagnosis not present

## 2022-01-19 DIAGNOSIS — H5213 Myopia, bilateral: Secondary | ICD-10-CM | POA: Diagnosis not present

## 2022-01-19 DIAGNOSIS — H40013 Open angle with borderline findings, low risk, bilateral: Secondary | ICD-10-CM | POA: Diagnosis not present

## 2022-01-19 DIAGNOSIS — H52223 Regular astigmatism, bilateral: Secondary | ICD-10-CM | POA: Diagnosis not present

## 2022-01-19 DIAGNOSIS — H524 Presbyopia: Secondary | ICD-10-CM | POA: Diagnosis not present

## 2022-01-21 ENCOUNTER — Telehealth: Payer: Self-pay | Admitting: Pharmacist

## 2022-01-21 NOTE — Telephone Encounter (Signed)
Patient is due follow up with PCP. Attempted outreach to patient to schedule appointment.  No answer - LM on VM to call office [phone] to schedule follow up.

## 2022-01-23 ENCOUNTER — Other Ambulatory Visit: Payer: Self-pay | Admitting: Family Medicine

## 2022-01-23 DIAGNOSIS — K219 Gastro-esophageal reflux disease without esophagitis: Secondary | ICD-10-CM

## 2022-01-23 DIAGNOSIS — D471 Chronic myeloproliferative disease: Secondary | ICD-10-CM

## 2022-01-23 DIAGNOSIS — E041 Nontoxic single thyroid nodule: Secondary | ICD-10-CM

## 2022-01-23 DIAGNOSIS — E039 Hypothyroidism, unspecified: Secondary | ICD-10-CM

## 2022-01-31 ENCOUNTER — Other Ambulatory Visit: Payer: Self-pay | Admitting: Family Medicine

## 2022-01-31 DIAGNOSIS — I10 Essential (primary) hypertension: Secondary | ICD-10-CM

## 2022-02-01 ENCOUNTER — Encounter: Payer: Self-pay | Admitting: Family Medicine

## 2022-02-01 DIAGNOSIS — G720 Drug-induced myopathy: Secondary | ICD-10-CM | POA: Insufficient documentation

## 2022-02-02 ENCOUNTER — Encounter: Payer: Self-pay | Admitting: Family Medicine

## 2022-02-02 ENCOUNTER — Ambulatory Visit (INDEPENDENT_AMBULATORY_CARE_PROVIDER_SITE_OTHER): Payer: Medicare Other | Admitting: Family Medicine

## 2022-02-02 VITALS — BP 120/80 | HR 57 | Temp 98.1°F | Resp 18 | Ht 66.0 in | Wt 215.4 lb

## 2022-02-02 DIAGNOSIS — E785 Hyperlipidemia, unspecified: Secondary | ICD-10-CM

## 2022-02-02 DIAGNOSIS — E559 Vitamin D deficiency, unspecified: Secondary | ICD-10-CM

## 2022-02-02 DIAGNOSIS — I10 Essential (primary) hypertension: Secondary | ICD-10-CM

## 2022-02-02 DIAGNOSIS — E039 Hypothyroidism, unspecified: Secondary | ICD-10-CM | POA: Diagnosis not present

## 2022-02-02 DIAGNOSIS — D471 Chronic myeloproliferative disease: Secondary | ICD-10-CM

## 2022-02-02 DIAGNOSIS — E079 Disorder of thyroid, unspecified: Secondary | ICD-10-CM | POA: Diagnosis not present

## 2022-02-02 DIAGNOSIS — E538 Deficiency of other specified B group vitamins: Secondary | ICD-10-CM

## 2022-02-02 DIAGNOSIS — Z23 Encounter for immunization: Secondary | ICD-10-CM

## 2022-02-02 LAB — COMPREHENSIVE METABOLIC PANEL
ALT: 12 U/L (ref 0–35)
AST: 14 U/L (ref 0–37)
Albumin: 4.8 g/dL (ref 3.5–5.2)
Alkaline Phosphatase: 148 U/L — ABNORMAL HIGH (ref 39–117)
BUN: 12 mg/dL (ref 6–23)
CO2: 28 mEq/L (ref 19–32)
Calcium: 10.7 mg/dL — ABNORMAL HIGH (ref 8.4–10.5)
Chloride: 101 mEq/L (ref 96–112)
Creatinine, Ser: 0.45 mg/dL (ref 0.40–1.20)
GFR: 109.5 mL/min (ref >60.00)
Glucose, Bld: 84 mg/dL (ref 70–99)
Potassium: 4.2 mEq/L (ref 3.5–5.1)
Sodium: 136 mEq/L (ref 135–145)
Total Bilirubin: 0.6 mg/dL (ref 0.2–1.2)
Total Protein: 7.4 g/dL (ref 6.0–8.3)

## 2022-02-02 LAB — CBC WITH DIFFERENTIAL/PLATELET
Basophils Absolute: 0 10*3/uL (ref 0.0–0.1)
Basophils Relative: 0.3 % (ref 0.0–3.0)
Eosinophils Absolute: 0.1 10*3/uL (ref 0.0–0.7)
Eosinophils Relative: 0.4 % (ref 0.0–5.0)
HCT: 40.9 % (ref 36.0–46.0)
Hemoglobin: 13.4 g/dL (ref 12.0–15.0)
Lymphocytes Relative: 17.8 % (ref 12.0–46.0)
Lymphs Abs: 2.8 10*3/uL (ref 0.7–4.0)
MCHC: 32.6 g/dL (ref 30.0–36.0)
MCV: 91.3 fl (ref 78.0–100.0)
Monocytes Absolute: 0.8 10*3/uL (ref 0.1–1.0)
Monocytes Relative: 4.8 % (ref 3.0–12.0)
Neutro Abs: 12 10*3/uL — ABNORMAL HIGH (ref 1.4–7.7)
Neutrophils Relative %: 76.7 % (ref 43.0–77.0)
Platelets: 304 10*3/uL (ref 150.0–400.0)
RBC: 4.48 Mil/uL (ref 3.87–5.11)
RDW: 13 % (ref 11.5–15.5)
WBC: 15.7 10*3/uL — ABNORMAL HIGH (ref 4.0–10.5)

## 2022-02-02 LAB — LDL CHOLESTEROL, DIRECT: Direct LDL: 121 mg/dL

## 2022-02-02 LAB — LIPID PANEL
Cholesterol: 190 mg/dL (ref 0–200)
HDL: 48 mg/dL (ref >39.00)
NonHDL: 142.06
Total CHOL/HDL Ratio: 4
Triglycerides: 212 mg/dL — ABNORMAL HIGH (ref 0.0–149.0)
VLDL: 42.4 mg/dL — ABNORMAL HIGH (ref 0.0–40.0)

## 2022-02-02 LAB — VITAMIN D 25 HYDROXY (VIT D DEFICIENCY, FRACTURES): VITD: 28.69 ng/mL — ABNORMAL LOW (ref 30.00–100.00)

## 2022-02-02 LAB — VITAMIN B12: Vitamin B-12: 964 pg/mL — ABNORMAL HIGH (ref 211–911)

## 2022-02-02 LAB — TSH: TSH: 0.92 u[IU]/mL (ref 0.35–5.50)

## 2022-02-02 NOTE — Assessment & Plan Note (Signed)
Check the labs

## 2022-02-02 NOTE — Patient Instructions (Signed)

## 2022-02-02 NOTE — Assessment & Plan Note (Signed)
Well controlled, no changes to meds. Encouraged heart healthy diet such as the DASH diet and exercise as tolerated.  °

## 2022-02-02 NOTE — Progress Notes (Signed)
Subjective:   By signing my name below, I, Shehryar Baig, attest that this documentation has been prepared under the direction and in the presence of Ann Held, DO. 02/02/2022      Patient ID: Brianna Fletcher, female    DOB: 1970-09-27, 51 y.o.   MRN: [id number]  Chief Complaint  Patient presents with   Hypertension   Hypothyroidism   Follow-up    Hypertension Pertinent negatives include no chest pain, headaches, malaise/fatigue, palpitations or shortness of breath.   Patient is in today for a follow up visit.   She reports seeing an urgent care a month ago due to having cough and congestion. She was tested for allergies and told she needed to take allergy medication. She seen another provider later and was told her allergies were flaring up and was put on zyrtec but found no relief in her symptoms. Her congestion had cleared up a couple of weeks later but she continues having a cough at this time. She continues taking zyrtec and reports no change in her symptoms.  She reports losing weight consistently until a month ago. She has no change in her eating or exercise habits. She is requesting to check her thyroid levels during her next blood work.  She is due for a colonoscopy and is not interested in setting up an appointment at this time.  She is interested in receiving the flu vaccine during this visit.    Past Medical History:  Diagnosis Date   Arthritis    Back pain    Blood dyscrasia    produces too many WBCs   Chronic neutrophilia 11/15/2012   Depression    Edema, lower extremity    Family history of heart disease    mother had stents placed in her 42s and had myocardial infarctions   Fibromyalgia    GERD (gastroesophageal reflux disease)    Glaucoma    Glaucoma    High cholesterol    Hypertension    IBS (irritable bowel syndrome)    Joint pain    Leukocytosis, unspecified 11/15/2012   Monocytosis 11/15/2012   Myeloproliferative disorder ([place]) 03/08/2013    Precordial chest pain    Pruritus 11/15/2012   Radial artery occlusion, right (HCC)    status post  right radial artery cardiac catheterization   Thyroid disease    Vaginal delivery 1986, 2000   Vitamin D deficiency     Past Surgical History:  Procedure Laterality Date   CARDIAC CATHETERIZATION N/A 10/03/2014   Procedure: Left Heart Cath and Coronary Angiography;  Surgeon: Lorretta Harp, MD;  Location: [facility];  Service: Cardiovascular;  Laterality: N/A;   DILITATION & CURRETTAGE/HYSTROSCOPY WITH NOVASURE ABLATION N/A 11/08/2014   Procedure: DILATATION & CURETTAGE/HYSTEROSCOPY WITH NOVASURE ABLATION;  Surgeon: Linda Hedges, DO;  Location: [facility];  Service: Gynecology;  Laterality: N/A;   EYE SURGERY     laser eye surgery   LAPAROSCOPIC VAGINAL HYSTERECTOMY WITH SALPINGECTOMY Bilateral 03/05/2015   Procedure: LAPAROSCOPIC ASSISTED VAGINAL HYSTERECTOMY WITH SALPINGECTOMY;  Surgeon: Linda Hedges, DO;  Location: [facility];  Service: Gynecology;  Laterality: Bilateral;   TUBAL LIGATION  2000    Family History  Problem Relation Age of Onset   Heart disease Mother    Hypertension Mother    Hyperlipidemia Mother    Depression Mother    Sleep apnea Mother    Obesity Mother    Diabetes Father    Cancer Father  esopageal ?   Alcoholism Father    Dementia Maternal Grandmother    Heart disease Maternal Grandmother    Heart disease Maternal Grandfather 69       MI   Heart disease Cousin    Other Neg Hx        hyperparathyroidism    Social History   Socioeconomic History   Marital status: Divorced    Spouse name: Not on file   Number of children: Not on file   Years of education: Not on file   Highest education level: Not on file  Occupational History   Occupation: unemployed  Tobacco Use   Smoking status: Never   Smokeless tobacco: Never   Tobacco comments:    08/03/2021 Never smoked.  Vaping Use   Vaping Use: Never used  Substance and Sexual Activity    Alcohol use: Not Currently    Alcohol/week: 6.0 standard drinks of alcohol    Types: 6 Cans of beer per week   Drug use: No   Sexual activity: Not Currently    Partners: Male  Other Topics Concern   Not on file  Social History Narrative   Very little exercise   Social Determinants of Health   Financial Resource Strain: Low Risk  (12/10/2020)   Overall Financial Resource Strain (CARDIA)    Difficulty of Paying Living Expenses: Not hard at all  Food Insecurity: No Food Insecurity (12/10/2020)   Hunger Vital Sign    Worried About Running Out of Food in the Last Year: Never true    Ran Out of Food in the Last Year: Never true  Transportation Needs: No Transportation Needs (12/10/2020)   PRAPARE - Hydrologist (Medical): No    Lack of Transportation (Non-Medical): No  Physical Activity: Inactive (12/10/2020)   Exercise Vital Sign    Days of Exercise per Week: 0 days    Minutes of Exercise per Session: 0 min  Stress: No Stress Concern Present (12/10/2020)   [place]    Feeling of Stress : Only a little  Social Connections: Moderately Isolated (12/10/2020)   Social Connection and Isolation Panel [NHANES]    Frequency of Communication with Friends and Family: More than three times a week    Frequency of Social Gatherings with Friends and Family: More than three times a week    Attends Religious Services: Never    Marine scientist or Organizations: No    Attends Archivist Meetings: Never    Marital Status: Living with partner  Intimate Partner Violence: Not At Risk (12/10/2020)   Humiliation, Afraid, Rape, and Kick questionnaire    Fear of Current or Ex-Partner: No    Emotionally Abused: No    Physically Abused: No    Sexually Abused: No    Outpatient Medications Prior to Visit  Medication Sig Dispense Refill   amLODipine (NORVASC) 10 MG tablet Take 1 tablet (10 mg total)  by mouth daily. 90 tablet 1   aspirin 81 MG tablet Take 81 mg by mouth daily.     chlorhexidine (PERIDEX) 0.12 % solution Use as directed 5 mLs in the mouth or throat daily.     Cholecalciferol (VITAMIN D3 PO) Take 2,000 Int'l Units by mouth daily.     ezetimibe (ZETIA) 10 MG tablet Take 1 tablet (10 mg total) by mouth daily. 90 tablet 2   furosemide (LASIX) 20 MG tablet Take 1 tablet (20 mg  total) by mouth daily. Pt needs office visit for further refills 90 tablet 0   KRILL OIL PO Take 1,200 mg by mouth daily.     levothyroxine (SYNTHROID) 50 MCG tablet TAKE 1 TABLET BY MOUTH EVERY DAY NEEDS OFFICE VISIT FOR REFILLS 30 tablet 0   Magnesium 500 MG CAPS Take by mouth daily.      omeprazole (PRILOSEC) 20 MG capsule TAKE 1 CAPSULE BY MOUTH EVERY DAY 90 capsule 1   zinc gluconate 50 MG tablet Take 50 mg by mouth daily.     Bempedoic Acid 180 MG TABS Take 180 mg by mouth every Monday, Wednesday, and Friday. (Patient not taking: Reported on 02/02/2022) 90 tablet 3   melatonin 5 MG TABS Take 5 mg by mouth at bedtime as needed. (Patient not taking: Reported on 02/02/2022)     Facility-Administered Medications Prior to Visit  Medication Dose Route Frequency Provider Last Rate Last Admin   acetaminophen (TYLENOL) tablet 650 mg  650 mg Oral Once [name], MD       peginterferon alfa-2a (PEGASYS) injection 81 mcg  81 mcg Subcutaneous Weekly [name], MD   81 mcg at 04/26/14 1530    Allergies  Allergen Reactions   Lisinopril Cough   Losartan Cough   Lubiprostone Nausea And Vomiting   Pravachol [Pravastatin Sodium] Other (See Comments)    "Causes pain in my body"   Repatha [Evolocumab] Cough    Severe cough that would not respond to any treatment - per patient.    Review of Systems  Constitutional:  Negative for chills, fever and malaise/fatigue.  HENT:  Negative for congestion and hearing loss.   Eyes:  Negative for discharge.  Respiratory:  Positive for cough. Negative for  sputum production and shortness of breath.   Cardiovascular:  Negative for chest pain, palpitations and leg swelling.  Gastrointestinal:  Negative for abdominal pain, blood in stool, constipation, diarrhea, heartburn, nausea and vomiting.  Genitourinary:  Negative for dysuria, frequency, hematuria and urgency.  Musculoskeletal:  Negative for back pain, falls and myalgias.  Skin:  Negative for rash.  Neurological:  Negative for dizziness, sensory change, loss of consciousness, weakness and headaches.  Endo/Heme/Allergies:  Negative for environmental allergies. Does not bruise/bleed easily.  Psychiatric/Behavioral:  Negative for depression and suicidal ideas. The patient is not nervous/anxious and does not have insomnia.        Objective:    Physical Exam Vitals and nursing note reviewed.  Constitutional:      General: She is not in acute distress.    Appearance: Normal appearance. She is well-developed. She is not ill-appearing.  HENT:     Head: Normocephalic and atraumatic.     Right Ear: External ear normal.     Left Ear: External ear normal.  Eyes:     Extraocular Movements: Extraocular movements intact.     Conjunctiva/sclera: Conjunctivae normal.     Pupils: Pupils are equal, round, and reactive to light.  Neck:     Thyroid: No thyromegaly.     Vascular: No carotid bruit or JVD.  Cardiovascular:     Rate and Rhythm: Normal rate and regular rhythm.     Heart sounds: Normal heart sounds. No murmur heard.    No gallop.  Pulmonary:     Effort: Pulmonary effort is normal. No respiratory distress.     Breath sounds: Normal breath sounds. No wheezing or rales.  Chest:     Chest wall: No tenderness.  Musculoskeletal:  Cervical back: Normal range of motion and neck supple.  Skin:    General: Skin is warm and dry.  Neurological:     Mental Status: She is alert and oriented to person, place, and time.  Psychiatric:        Judgment: Judgment normal.     BP 120/80 (BP  Location: Right Arm, Patient Position: Sitting, Cuff Size: Large)   Pulse (!) 57   Temp 98.1 F (36.7 C) (Oral)   Resp 18   Ht _0  (1.676 m)   Wt 215 lb 6.4 oz (97.7 kg)   LMP  (LMP Unknown)   SpO2 98%   BMI 34.77 kg/m  Wt Readings from Last 3 Encounters:  02/02/22 215 lb 6.4 oz (97.7 kg)  10/09/21 222 lb (100.7 kg)  10/08/21 221 lb (100.2 kg)    Diabetic Foot Exam - Simple   No data filed    Lab Results  Component Value Date   WBC 17.3 (H) 10/09/2021   HGB 14.3 10/09/2021   HCT 43.5 10/09/2021   PLT 310 10/09/2021   GLUCOSE 103 (H) 10/09/2021   CHOL 209 (H) 10/08/2021   TRIG 198 (H) 10/08/2021   HDL 47 10/08/2021   LDLDIRECT 167.0 01/31/2020   LDLCALC 127 (H) 10/08/2021   ALT 9 10/09/2021   AST 13 (L) 10/09/2021   NA 137 10/09/2021   K 3.8 10/09/2021   CL 103 10/09/2021   CREATININE 0.61 10/09/2021   BUN 9 10/09/2021   CO2 26 10/09/2021   TSH 0.78 02/17/2021   INR 0.9 05/06/2016   MICROALBUR <0.7 04/23/2016    Lab Results  Component Value Date   TSH 0.78 02/17/2021   Lab Results  Component Value Date   WBC 17.3 (H) 10/09/2021   HGB 14.3 10/09/2021   HCT 43.5 10/09/2021   MCV 93.1 10/09/2021   PLT 310 10/09/2021   Lab Results  Component Value Date   NA 137 10/09/2021   K 3.8 10/09/2021   CHLORIDE 102 03/10/2016   CO2 26 10/09/2021   GLUCOSE 103 (H) 10/09/2021   BUN 9 10/09/2021   CREATININE 0.61 10/09/2021   BILITOT 0.7 10/09/2021   ALKPHOS 135 (H) 10/09/2021   AST 13 (L) 10/09/2021   ALT 9 10/09/2021   PROT 8.0 10/09/2021   ALBUMIN 5.1 (H) 10/09/2021   CALCIUM 11.2 (H) 10/09/2021   ANIONGAP 8 10/09/2021   EGFR 104 10/08/2021   GFR 103.27 02/17/2021   Lab Results  Component Value Date   CHOL 209 (H) 10/08/2021   Lab Results  Component Value Date   HDL 47 10/08/2021   Lab Results  Component Value Date   LDLCALC 127 (H) 10/08/2021   Lab Results  Component Value Date   TRIG 198 (H) 10/08/2021   Lab Results  Component Value  Date   CHOLHDL 4.4 10/08/2021   No results found for: "HGBA1C"     Assessment & Plan:   Problem List Items Addressed This Visit       Unprioritized   Vitamin D deficiency   Relevant Orders   VITAMIN D 25 Hydroxy (Vit-D Deficiency, Fractures)   Thyroid disease   Relevant Orders   TSH   Myeloproliferative disorder ([place]) - Primary (Chronic)   Hypothyroidism    Check the labs       Hypertension   Relevant Orders   CBC with Differential/Platelet   Hyperlipidemia   Relevant Orders   Comprehensive metabolic panel   Lipid panel   HTN (  hypertension)   Relevant Orders   CBC with Differential/Platelet   Essential hypertension    Well controlled, no changes to meds. Encouraged heart healthy diet such as the DASH diet and exercise as tolerated.        Other Visit Diagnoses     Vitamin B12 deficiency       Relevant Orders   Vitamin B12   Need for influenza vaccination       Relevant Orders   Flu Vaccine QUAD 75moIM (Fluarix, Fluzone & Alfiuria Quad PF) (Completed)        No orders of the defined types were placed in this encounter.   I,Ann Held DO, personally preformed the services described in this documentation.  All medical record entries made by the scribe were at my direction and in my presence.  I have reviewed the chart and discharge instructions (if applicable) and agree that the record reflects my personal performance and is accurate and complete. 02/02/2022   I,Shehryar Baig,acting as a scribe for YAnn Held DO.,have documented all relevant documentation on the behalf of YAnn Held DO,as directed by  YAnn Held DO while in the presence of YAnn Held DO.   YAnn Held DO

## 2022-02-04 ENCOUNTER — Telehealth: Payer: Self-pay | Admitting: Family Medicine

## 2022-02-04 ENCOUNTER — Other Ambulatory Visit: Payer: Self-pay | Admitting: Family Medicine

## 2022-02-04 DIAGNOSIS — E039 Hypothyroidism, unspecified: Secondary | ICD-10-CM

## 2022-02-04 DIAGNOSIS — D471 Chronic myeloproliferative disease: Secondary | ICD-10-CM

## 2022-02-04 DIAGNOSIS — E041 Nontoxic single thyroid nodule: Secondary | ICD-10-CM

## 2022-02-04 DIAGNOSIS — R600 Localized edema: Secondary | ICD-10-CM

## 2022-02-04 DIAGNOSIS — K219 Gastro-esophageal reflux disease without esophagitis: Secondary | ICD-10-CM

## 2022-02-04 MED ORDER — OMEPRAZOLE 20 MG PO CPDR: DELAYED_RELEASE_CAPSULE | ORAL | 1 refills | Status: DC

## 2022-02-04 MED ORDER — LEVOTHYROXINE SODIUM 50 MCG PO TABS: ORAL_TABLET | ORAL | 0 refills | Status: DC

## 2022-02-04 MED ORDER — FUROSEMIDE 20 MG PO TABS: 20.0000 mg | ORAL_TABLET | Freq: Every day | ORAL | 0 refills | Status: DC

## 2022-02-04 NOTE — Telephone Encounter (Signed)
Patient said she was in the office this week and thought Dr. Etter Sjogren would send refills in for all of her medications but [facility] didn't have them last night when she checked.   Patient is asking for all medications to be [facility] [place]

## 2022-02-04 NOTE — Telephone Encounter (Signed)
Refill sent.

## 2022-02-08 ENCOUNTER — Telehealth: Payer: Self-pay | Admitting: Family Medicine

## 2022-02-08 NOTE — Telephone Encounter (Signed)
Pt called with a few things she wanted to go over regarding her recent lab work. All CMAs were occupied at the time and advised pt a note would be sent back to give her a call later. Pt acknowledged understanding.

## 2022-02-10 ENCOUNTER — Other Ambulatory Visit: Payer: Self-pay

## 2022-02-10 NOTE — Telephone Encounter (Signed)
Pt called and lvm to return call. Pt did view labs via [place].Orders have been placed

## 2022-02-17 ENCOUNTER — Other Ambulatory Visit (INDEPENDENT_AMBULATORY_CARE_PROVIDER_SITE_OTHER): Payer: Medicare Other

## 2022-02-17 ENCOUNTER — Inpatient Hospital Stay: Payer: Medicare Other | Attending: Hematology & Oncology

## 2022-02-17 ENCOUNTER — Encounter: Payer: Self-pay | Admitting: Hematology & Oncology

## 2022-02-17 ENCOUNTER — Inpatient Hospital Stay (HOSPITAL_BASED_OUTPATIENT_CLINIC_OR_DEPARTMENT_OTHER): Payer: Medicare Other | Admitting: Hematology & Oncology

## 2022-02-17 VITALS — BP 146/76 | HR 59 | Temp 98.2°F | Resp 20 | Ht 66.0 in | Wt 218.4 lb

## 2022-02-17 DIAGNOSIS — D471 Chronic myeloproliferative disease: Secondary | ICD-10-CM | POA: Diagnosis present

## 2022-02-17 DIAGNOSIS — E039 Hypothyroidism, unspecified: Secondary | ICD-10-CM | POA: Insufficient documentation

## 2022-02-17 LAB — CBC WITH DIFFERENTIAL (CANCER CENTER ONLY)
Abs Immature Granulocytes: 0.36 10*3/uL — ABNORMAL HIGH (ref 0.00–0.07)
Basophils Absolute: 0.1 10*3/uL (ref 0.0–0.1)
Basophils Relative: 0 %
Eosinophils Absolute: 0.1 10*3/uL (ref 0.0–0.5)
Eosinophils Relative: 1 %
HCT: 43.8 % (ref 36.0–46.0)
Hemoglobin: 14.1 g/dL (ref 12.0–15.0)
Immature Granulocytes: 2 %
Lymphocytes Relative: 16 %
Lymphs Abs: 2.7 10*3/uL (ref 0.7–4.0)
MCH: 30.3 pg (ref 26.0–34.0)
MCHC: 32.2 g/dL (ref 30.0–36.0)
MCV: 94.2 fL (ref 80.0–100.0)
Monocytes Absolute: 0.9 10*3/uL (ref 0.1–1.0)
Monocytes Relative: 5 %
Neutro Abs: 12.7 10*3/uL — ABNORMAL HIGH (ref 1.7–7.7)
Neutrophils Relative %: 76 %
Platelet Count: 289 10*3/uL (ref 150–400)
RBC: 4.65 MIL/uL (ref 3.87–5.11)
RDW: 12.4 % (ref 11.5–15.5)
WBC Count: 16.8 10*3/uL — ABNORMAL HIGH (ref 4.0–10.5)
nRBC: 0 % (ref 0.0–0.2)

## 2022-02-17 LAB — CMP (CANCER CENTER ONLY)
ALT: 10 U/L (ref 0–44)
AST: 13 U/L — ABNORMAL LOW (ref 15–41)
Albumin: 4.9 g/dL (ref 3.5–5.0)
Alkaline Phosphatase: 139 U/L — ABNORMAL HIGH (ref 38–126)
Anion gap: 7 (ref 5–15)
BUN: 20 mg/dL (ref 6–20)
CO2: 28 mmol/L (ref 22–32)
Calcium: 11.1 mg/dL — ABNORMAL HIGH (ref 8.9–10.3)
Chloride: 102 mmol/L (ref 98–111)
Creatinine: 0.58 mg/dL (ref 0.44–1.00)
GFR, Estimated: >60 mL/min (ref >60)
Glucose, Bld: 106 mg/dL — ABNORMAL HIGH (ref 70–99)
Potassium: 4.2 mmol/L (ref 3.5–5.1)
Sodium: 137 mmol/L (ref 135–145)
Total Bilirubin: 0.7 mg/dL (ref 0.3–1.2)
Total Protein: 7.5 g/dL (ref 6.5–8.1)

## 2022-02-17 LAB — LACTATE DEHYDROGENASE: LDH: 156 U/L (ref 98–192)

## 2022-02-17 LAB — SAVE SMEAR(SSMR), FOR PROVIDER SLIDE REVIEW

## 2022-02-17 NOTE — Progress Notes (Signed)
Hematology and Oncology Follow Up Visit  AKSHITA ITALIANO [id number] 1970-09-20 51 y.o. 02/17/2022   Principle Diagnosis:  Chronic myeloproliferative syndrome-Triple negative Sub-clinical hypothyroidism (+) p-ANCA  Current Therapy:   Peg-Interferon q weekly - q 4 week dosing - On hold due to cost Synthroid 0.050 mg po q day   Interim History: Ms. Hansmann is here today for follow-up.  She still is having problems with her mom.  Her mom is in a nursing home.  However, her mom is not eating.  Mom has been following.  She had a bad fall and hit her head.  She had to go to the emergency room for this.  Next problem is that Ms. Urey is having issues with her left shoulder.  She has seen orthopedic surgery before.  She is also worried about her vitamin D.  Vitamin D level is low.  Her family doctor is managing this.  Her family doctor feels that Ms. Stonehouse's a calcium is too high.  However, when you correct the calcium to the albumin, her calcium is normal.  She has had no problems with bowels or bladder.  She has had no cough or shortness of breath.  There is been no issues with COVID.  She has 1 granddaughter up in [place].  The other granddaughter is in [place].  She thought she might be able to go see one of them for Christmas but unfortunately she has to stay close by because of her mom.  Currently, I would say performance status is probably ECOG 1.        Medications:  Allergies as of 02/17/2022       Reactions   Lisinopril Cough   Losartan Cough   Lubiprostone Nausea And Vomiting   Pravachol [pravastatin Sodium] Other (See Comments)   "Causes pain in my body"   Repatha [evolocumab] Cough   Severe cough that would not respond to any treatment - per patient.        Medication List        Accurate as of February 17, 2022 10:47 AM. If you have any questions, ask your nurse or doctor.          amLODipine 10 MG tablet Commonly known as: NORVASC Take 1  tablet (10 mg total) by mouth daily.   aspirin 81 MG tablet Take 81 mg by mouth daily.   chlorhexidine 0.12 % solution Commonly known as: PERIDEX Use as directed 5 mLs in the mouth or throat daily.   ezetimibe 10 MG tablet Commonly known as: ZETIA Take 1 tablet (10 mg total) by mouth daily.   furosemide 20 MG tablet Commonly known as: LASIX Take 1 tablet (20 mg total) by mouth daily. Pt needs office visit for further refills   KRILL OIL PO Take 1,200 mg by mouth daily.   levothyroxine 50 MCG tablet Commonly known as: SYNTHROID TAKE 1 TABLET BY MOUTH EVERY DAY NEEDS OFFICE VISIT FOR REFILLS   Magnesium 500 MG Caps Take by mouth daily.   omeprazole 20 MG capsule Commonly known as: PRILOSEC TAKE 1 CAPSULE BY MOUTH EVERY DAY   VITAMIN D3 PO Take 2,000 Int'l Units by mouth daily.   zinc gluconate 50 MG tablet Take 50 mg by mouth daily.        Allergies:  Allergies  Allergen Reactions   Lisinopril Cough   Losartan Cough   Lubiprostone Nausea And Vomiting   Pravachol [Pravastatin Sodium] Other (See Comments)    "Causes pain in my  body"   Repatha [Evolocumab] Cough    Severe cough that would not respond to any treatment - per patient.    Past Medical History, Surgical history, Social history, and Family History were reviewed and updated.  Review of Systems: Review of Systems  Constitutional:  Positive for malaise/fatigue.  HENT: Negative.    Eyes: Negative.   Respiratory: Negative.    Cardiovascular: Negative.   Gastrointestinal:  Positive for diarrhea.  Genitourinary: Negative.   Musculoskeletal:  Positive for myalgias.  Skin: Negative.   Neurological:  Positive for dizziness.  Psychiatric/Behavioral: Negative.       Physical Exam:  vitals were not taken for this visit.   Wt Readings from Last 3 Encounters:  02/02/22 215 lb 6.4 oz (97.7 kg)  10/09/21 222 lb (100.7 kg)  10/08/21 221 lb (100.2 kg)    Physical Exam Vitals reviewed.  HENT:      Head: Normocephalic and atraumatic.  Eyes:     Pupils: Pupils are equal, round, and reactive to light.  Cardiovascular:     Rate and Rhythm: Normal rate and regular rhythm.     Heart sounds: Normal heart sounds.  Pulmonary:     Effort: Pulmonary effort is normal.     Breath sounds: Normal breath sounds.  Abdominal:     General: Bowel sounds are normal.     Palpations: Abdomen is soft.  Musculoskeletal:        General: No tenderness or deformity. Normal range of motion.     Cervical back: Normal range of motion.  Lymphadenopathy:     Cervical: No cervical adenopathy.  Skin:    General: Skin is warm and dry.     Findings: No erythema or rash.  Neurological:     Mental Status: She is alert and oriented to person, place, and time.  Psychiatric:        Behavior: Behavior normal.        Thought Content: Thought content normal.        Judgment: Judgment normal.   Lab Results  Component Value Date   WBC 16.8 (H) 02/17/2022   HGB 14.1 02/17/2022   HCT 43.8 02/17/2022   MCV 94.2 02/17/2022   PLT 289 02/17/2022   Lab Results  Component Value Date   FERRITIN 60 08/03/2021   IRON 89 08/03/2021   TIBC 350 08/03/2021   UIBC 261 08/03/2021   IRONPCTSAT 25 08/03/2021   Lab Results  Component Value Date   RETICCTPCT 2.1 04/12/2017   RBC 4.65 02/17/2022   RETICCTABS 116.1 11/20/2013   No results found for: "KPAFRELGTCHN", "LAMBDASER", "KAPLAMBRATIO" No results found for: "IGGSERUM", "IGA", "IGMSERUM" No results found for: "TOTALPROTELP", "ALBUMINELP", "A1GS", "A2GS", "BETS", "BETA2SER", "GAMS", "MSPIKE", "SPEI"   Chemistry      Component Value Date/Time   NA 137 02/17/2022 0932   NA 138 10/08/2021 1415   NA 140 02/08/2017 0925   NA 136 03/10/2016 1147   K 4.2 02/17/2022 0932   K 3.4 02/08/2017 0925   K 3.9 03/10/2016 1147   CL 102 02/17/2022 0932   CL 105 02/08/2017 0925   CO2 28 02/17/2022 0932   CO2 26 02/08/2017 0925   CO2 24 03/10/2016 1147   BUN 20 02/17/2022 0932    BUN 9 10/08/2021 1415   BUN 10 02/08/2017 0925   BUN 10.2 03/10/2016 1147   CREATININE 0.58 02/17/2022 0932   CREATININE 0.7 02/08/2017 0925   CREATININE 0.7 03/10/2016 1147      Component Value Date/Time  CALCIUM 11.1 (H) 02/17/2022 0932   CALCIUM 10.4 (H) 02/08/2017 0925   CALCIUM 10.7 (H) 03/10/2016 1147   ALKPHOS 139 (H) 02/17/2022 0932   ALKPHOS 106 (H) 02/08/2017 0925   ALKPHOS 132 03/10/2016 1147   AST 13 (L) 02/17/2022 0932   AST 12 03/10/2016 1147   ALT 10 02/17/2022 0932   ALT 16 02/08/2017 0925   ALT 8 03/10/2016 1147   BILITOT 0.7 02/17/2022 0932   BILITOT 0.63 03/10/2016 1147      Impression and Plan: Ms. Magner is a very pleasant 51 yo caucasian female with a triple negative myeloproliferative syndrome though all work up has been negative so far.   I just feel bad that she not able to see her grandchildren.  I know that she has to help with her mom.  Even though her mom is in a nursing home, she needs to be close by.  I do not see any problems with her seeing the Orthopedic Surgeon for the left shoulder.  If she needs surgery, I do not think this would be a problem.  We will still plan to get her back in 3 months for follow-up.  I think this is appropriate.   [name], MD 11/29/202310:47 AM

## 2022-02-18 LAB — PTH, INTACT AND CALCIUM
Calcium: 11 mg/dL — ABNORMAL HIGH (ref 8.6–10.4)
PTH: 240 pg/mL — ABNORMAL HIGH (ref 16–77)

## 2022-03-17 ENCOUNTER — Ambulatory Visit (INDEPENDENT_AMBULATORY_CARE_PROVIDER_SITE_OTHER): Payer: Medicare Other | Admitting: *Deleted

## 2022-03-17 ENCOUNTER — Telehealth: Payer: Self-pay | Admitting: *Deleted

## 2022-03-17 DIAGNOSIS — E213 Hyperparathyroidism, unspecified: Secondary | ICD-10-CM | POA: Diagnosis not present

## 2022-03-17 DIAGNOSIS — Z1211 Encounter for screening for malignant neoplasm of colon: Secondary | ICD-10-CM

## 2022-03-17 DIAGNOSIS — Z0000 Encounter for general adult medical examination without abnormal findings: Secondary | ICD-10-CM

## 2022-03-17 NOTE — Progress Notes (Signed)
Subjective:   Brianna Fletcher is a 51 y.o. female who presents for Medicare Annual (Subsequent) preventive examination.  I connected with  [name] on 03/17/22 by a audio enabled telemedicine application and verified that I am speaking with the correct person using two identifiers.  Patient Location: Home  Provider Location: Office/Clinic  I discussed the limitations of evaluation and management by telemedicine. The patient expressed understanding and agreed to proceed.   Review of Systems    Defer to PCP Cardiac Risk Factors include: hypertension;dyslipidemia;obesity (BMI >30kg/m2)     Objective:    There were no vitals filed for this visit. There is no height or weight on file to calculate BMI.     03/17/2022    3:01 PM 02/17/2022   10:44 AM 10/09/2021   10:39 AM 09/27/2021    6:00 PM 08/03/2021   11:49 AM 06/05/2021   10:57 AM 12/10/2020    7:50 AM  Advanced Directives  Does Patient Have a Medical Advance Directive? No No No No No No No  Would patient like information on creating a medical advance directive? No - Patient declined No - Patient declined No - Patient declined  No - Patient declined No - Patient declined No - Patient declined    Current Medications (verified) Outpatient Encounter Medications as of 03/17/2022  Medication Sig   amLODipine (NORVASC) 10 MG tablet Take 1 tablet (10 mg total) by mouth daily.   aspirin 81 MG tablet Take 81 mg by mouth daily.   chlorhexidine (PERIDEX) 0.12 % solution Use as directed 5 mLs in the mouth or throat daily.   Cholecalciferol (VITAMIN D3 PO) Take 2,000 Int'l Units by mouth daily.   ezetimibe (ZETIA) 10 MG tablet Take 1 tablet (10 mg total) by mouth daily.   furosemide (LASIX) 20 MG tablet Take 1 tablet (20 mg total) by mouth daily. Pt needs office visit for further refills   KRILL OIL PO Take 1,200 mg by mouth daily.   levothyroxine (SYNTHROID) 50 MCG tablet TAKE 1 TABLET BY MOUTH EVERY DAY NEEDS OFFICE VISIT FOR  REFILLS   Magnesium 500 MG CAPS Take by mouth daily.    omeprazole (PRILOSEC) 20 MG capsule TAKE 1 CAPSULE BY MOUTH EVERY DAY   zinc gluconate 50 MG tablet Take 50 mg by mouth daily.   Facility-Administered Encounter Medications as of 03/17/2022  Medication   acetaminophen (TYLENOL) tablet 650 mg   peginterferon alfa-2a (PEGASYS) injection 81 mcg    Allergies (verified) Lisinopril, Losartan, Lubiprostone, Pravachol [pravastatin sodium], and Repatha [evolocumab]   History: Past Medical History:  Diagnosis Date   Arthritis    Back pain    Blood dyscrasia    produces too many WBCs   Chronic neutrophilia 11/15/2012   Depression    Edema, lower extremity    Family history of heart disease    mother had stents placed in her 2s and had myocardial infarctions   Fibromyalgia    GERD (gastroesophageal reflux disease)    Glaucoma    Glaucoma    High cholesterol    Hypertension    IBS (irritable bowel syndrome)    Joint pain    Leukocytosis, unspecified 11/15/2012   Monocytosis 11/15/2012   Myeloproliferative disorder ([place]) 03/08/2013   Precordial chest pain    Pruritus 11/15/2012   Radial artery occlusion, right (HCC)    status post  right radial artery cardiac catheterization   Thyroid disease    Vaginal delivery 1986, 2000   Vitamin  D deficiency    Past Surgical History:  Procedure Laterality Date   CARDIAC CATHETERIZATION N/A 10/03/2014   Procedure: Left Heart Cath and Coronary Angiography;  Surgeon: Lorretta Harp, MD;  Location: [facility];  Service: Cardiovascular;  Laterality: N/A;   DILITATION & CURRETTAGE/HYSTROSCOPY WITH NOVASURE ABLATION N/A 11/08/2014   Procedure: DILATATION & CURETTAGE/HYSTEROSCOPY WITH NOVASURE ABLATION;  Surgeon: Linda Hedges, DO;  Location: [facility];  Service: Gynecology;  Laterality: N/A;   EYE SURGERY     laser eye surgery   LAPAROSCOPIC VAGINAL HYSTERECTOMY WITH SALPINGECTOMY Bilateral 03/05/2015   Procedure: LAPAROSCOPIC ASSISTED  VAGINAL HYSTERECTOMY WITH SALPINGECTOMY;  Surgeon: Linda Hedges, DO;  Location: [facility];  Service: Gynecology;  Laterality: Bilateral;   TUBAL LIGATION  2000   Family History  Problem Relation Age of Onset   Heart disease Mother    Hypertension Mother    Hyperlipidemia Mother    Depression Mother    Sleep apnea Mother    Obesity Mother    Diabetes Father    Cancer Father        esopageal ?   Alcoholism Father    Dementia Maternal Grandmother    Heart disease Maternal Grandmother    Heart disease Maternal Grandfather 19       MI   Heart disease Cousin    Other Neg Hx        hyperparathyroidism   Social History   Socioeconomic History   Marital status: Divorced    Spouse name: Not on file   Number of children: Not on file   Years of education: Not on file   Highest education level: Not on file  Occupational History   Occupation: unemployed  Tobacco Use   Smoking status: Never   Smokeless tobacco: Never   Tobacco comments:    08/03/2021 Never smoked.  Vaping Use   Vaping Use: Never used  Substance and Sexual Activity   Alcohol use: Not Currently    Alcohol/week: 6.0 standard drinks of alcohol    Types: 6 Cans of beer per week   Drug use: No   Sexual activity: Not Currently    Partners: Male  Other Topics Concern   Not on file  Social History Narrative   Very little exercise   Social Determinants of Health   Financial Resource Strain: Low Risk  (12/10/2020)   Overall Financial Resource Strain (CARDIA)    Difficulty of Paying Living Expenses: Not hard at all  Food Insecurity: Food Insecurity Present (03/17/2022)   Hunger Vital Sign    Worried About Running Out of Food in the Last Year: Sometimes true    Ran Out of Food in the Last Year: Never true  Transportation Needs: No Transportation Needs (03/17/2022)   PRAPARE - Hydrologist (Medical): No    Lack of Transportation (Non-Medical): No  Physical Activity: Inactive (12/10/2020)    Exercise Vital Sign    Days of Exercise per Week: 0 days    Minutes of Exercise per Session: 0 min  Stress: No Stress Concern Present (12/10/2020)   [place]    Feeling of Stress : Only a little  Social Connections: Moderately Isolated (12/10/2020)   Social Connection and Isolation Panel [NHANES]    Frequency of Communication with Friends and Family: More than three times a week    Frequency of Social Gatherings with Friends and Family: More than three times a week  Attends Religious Services: Never    Active Member of Clubs or Organizations: No    Attends Archivist Meetings: Never    Marital Status: Living with partner    Tobacco Counseling Counseling given: Not Answered Tobacco comments: 08/03/2021 Never smoked.   Clinical Intake:  Pre-visit preparation completed: Yes  Pain : No/denies pain  Diabetes: No  How often do you need to have someone help you when you read instructions, pamphlets, or other written materials from your doctor or pharmacy?: 1 - Never  Activities of Daily Living    03/17/2022    3:15 PM  In your present state of health, do you have any difficulty performing the following activities:  Hearing? 1  Comment difficulty hearing  Vision? 0  Difficulty concentrating or making decisions? 0  Walking or climbing stairs? 1  Dressing or bathing? 0  Doing errands, shopping? 0  Preparing Food and eating ? N  Using the Toilet? N  In the past six months, have you accidently leaked urine? Y  Do you have problems with loss of bowel control? N  Managing your Medications? N  Managing your Finances? N  Housekeeping or managing your Housekeeping? N    Patient Care Team: Carollee Herter, Alferd Apa, DO as PCP - General (Family Medicine) Linda Hedges, DO as Consulting Physician (Obstetrics and Gynecology) [name], MD as Consulting Physician (Oncology)  Indicate any recent Medical  Services you may have received from other than [facility] providers in the past year (date may be approximate).     Assessment:   This is a routine wellness examination for Gali.  Hearing/Vision screen No results found.  Dietary issues and exercise activities discussed: Current Exercise Habits: The patient does not participate in regular exercise at present, Exercise limited by: None identified   Goals Addressed   None    Depression Screen    03/17/2022    3:14 PM 02/02/2022   10:39 AM 12/10/2020    7:55 AM 08/04/2020    9:54 AM 07/03/2020    9:44 AM 05/24/2019    9:01 AM 04/13/2018    4:09 PM  PHQ 2/9 Scores  PHQ - 2 Score 0 0 0 0 0 4 0  PHQ- 9 Score      9 3    Fall Risk    03/17/2022    3:02 PM 12/10/2020    7:55 AM 08/04/2020    9:53 AM 06/01/2016    9:51 AM 04/12/2016   12:12 PM  [place] in the past year? 0 0 0 No No  Number falls in past yr: 0 0     Injury with Fall? 0 0     Risk for fall due to : No Fall Risks      Follow up Falls evaluation completed Falls prevention discussed Falls evaluation completed      [place]:  Any stairs in or around the home? Yes  If so, are there any without handrails? No  Home free of loose throw rugs in walkways, pet beds, electrical cords, etc? Yes  Adequate lighting in your home to reduce risk of falls? Yes   ASSISTIVE DEVICES UTILIZED TO PREVENT FALLS:  Life alert? No  Use of a cane, walker or w/c? No  Grab bars in the bathroom? No  Shower chair or bench in shower? No  Elevated toilet seat or a handicapped toilet? No   TIMED UP AND GO:  Was  the test performed?  No, audio visit .   Cognitive Function:        03/17/2022    3:29 PM  6CIT Screen  What Year? 0 points  What month? 0 points  What time? 3 points  Count back from 20 0 points  Months in reverse 0 points  Repeat phrase 2 points  Total Score 5 points    Immunizations Immunization History  Administered Date(s)  Administered   Influenza Split 01/08/2013   Influenza,inj,Quad PF,6+ Mos 11/29/2014, 02/06/2016, 12/08/2018, 02/17/2021, 02/02/2022   PFIZER(Purple Top)SARS-COV-2 Vaccination 06/07/2019, 07/02/2019, 01/09/2020   Tdap 07/01/2014    TDAP status: Up to date  Flu Vaccine status: Up to date  Covid-19 vaccine status: Information provided on how to obtain vaccines.   Qualifies for Shingles Vaccine? Yes   Zostavax completed No   Shingrix Completed?: No.    Education has been provided regarding the importance of this vaccine. Patient has been advised to call insurance company to determine out of pocket expense if they have not yet received this vaccine. Advised may also receive vaccine at local pharmacy or Health Dept. Verbalized acceptance and understanding.  Screening Tests Health Maintenance  Topic Date Due   Zoster Vaccines- Shingrix (1 of 2) Never done   COLONOSCOPY (Pts 45-21yr Insurance coverage will need to be confirmed)  02/11/2020   COVID-19 Vaccine (4 - 2023-24 season) 11/20/2021   Medicare Annual Wellness (AWV)  12/10/2021   MAMMOGRAM  03/17/2022   DTaP/Tdap/Td (2 - Td or Tdap) 06/30/2024   INFLUENZA VACCINE  Completed   Hepatitis C Screening  Completed   HIV Screening  Completed   HPV VACCINES  Aged Out   PAP SMEAR-Modifier  Discontinued    Health Maintenance  Health Maintenance Due  Topic Date Due   Zoster Vaccines- Shingrix (1 of 2) Never done   COLONOSCOPY (Pts 45-46yrInsurance coverage will need to be confirmed)  02/11/2020   COVID-19 Vaccine (4 - 2023-24 season) 11/20/2021   Medicare Annual Wellness (AWV)  12/10/2021   MAMMOGRAM  03/17/2022    Colorectal cancer screening: Referral to GI placed 03/17/22. Pt aware the office will call re: appt.  Mammogram status: Completed 03/17/21. Repeat every year  Lung Cancer Screening: (Low Dose CT Chest recommended if Age 53-80ears, 30 pack-year currently smoking OR have quit w/in 15years.) does not qualify.    Additional Screening:  Hepatitis C Screening: does qualify; Completed 02/17/21  Vision Screening: Recommended annual ophthalmology exams for early detection of glaucoma and other disorders of the eye. Is the patient up to date with their annual eye exam?  Yes  Who is the provider or what is the name of the office in which the patient attends annual eye exams? [facility] is not [facility] with a provider, would they like to be referred to a provider to establish care? No .   Dental Screening: Recommended annual dental exams for proper oral hygiene  Community Resource Referral / Chronic Care Management: CRR required this visit?  No   CCM required this visit?  No      Plan:     I have personally reviewed and noted the following in the patient's chart:   Medical and social history Use of alcohol, tobacco or illicit drugs  Current medications and supplements including opioid prescriptions. Patient is not currently taking opioid prescriptions. Functional ability and status Nutritional status Physical activity Advanced directives List of other physicians Hospitalizations, surgeries, and ER visits in previous 1232  months Vitals Screenings to include cognitive, depression, and falls Referrals and appointments  In addition, I have reviewed and discussed with patient certain preventive protocols, quality metrics, and best practice recommendations. A written personalized care plan for preventive services as well as general preventive health recommendations were provided to patient.   Due to this being a telephonic visit, the after visit summary with patients personalized plan was offered to patient via mail or my-chart. Patient would like to access on my-chart.  Beatris Ship, [place]   03/17/2022   Nurse Notes: None

## 2022-03-17 NOTE — Telephone Encounter (Signed)
LMOM for pt to schedule AWV. 

## 2022-03-17 NOTE — Patient Instructions (Signed)
Brianna Fletcher , Thank you for taking time to come for your Medicare Wellness Visit. I appreciate your ongoing commitment to your health goals. Please review the following plan we discussed and let me know if I can assist you in the future.   These are the goals we discussed:  Goals      Patient Stated     Continue to eat healthy & drink plenty of water & try to lose some weight.        This is a list of the screening recommended for you and due dates:  Health Maintenance  Topic Date Due   Zoster (Shingles) Vaccine (1 of 2) Never done   Colon Cancer Screening  02/11/2020   COVID-19 Vaccine (4 - 2023-24 season) 11/20/2021   Mammogram  03/17/2022   Medicare Annual Wellness Visit  03/18/2023   DTaP/Tdap/Td vaccine (2 - Td or Tdap) 06/30/2024   Flu Shot  Completed   Hepatitis C Screening: USPSTF Recommendation to screen - Ages 18-79 yo.  Completed   HIV Screening  Completed   HPV Vaccine  Aged Out   Pap Smear  Discontinued      Next appointment: Follow up in one year for your annual wellness visit.   Preventive Care 40-64 Years, Female Preventive care refers to lifestyle choices and visits with your health care provider that can promote health and wellness. What does preventive care include? A yearly physical exam. This is also called an annual well check. Dental exams once or twice a year. Routine eye exams. Ask your health care provider how often you should have your eyes checked. Personal lifestyle choices, including: Daily care of your teeth and gums. Regular physical activity. Eating a healthy diet. Avoiding tobacco and drug use. Limiting alcohol use. Practicing safe sex. Taking low-dose aspirin daily starting at age 44. Taking vitamin and mineral supplements as recommended by your health care provider. What happens during an annual well check? The services and screenings done by your health care provider during your annual well check will depend on your age, overall  health, lifestyle risk factors, and family history of disease. Counseling  Your health care provider may ask you questions about your: Alcohol use. Tobacco use. Drug use. Emotional well-being. Home and relationship well-being. Sexual activity. Eating habits. Work and work Statistician. Method of birth control. Menstrual cycle. Pregnancy history. Screening  You may have the following tests or measurements: Height, weight, and BMI. Blood pressure. Lipid and cholesterol levels. These may be checked every 5 years, or more frequently if you are over 52 years old. Skin check. Lung cancer screening. You may have this screening every year starting at age 84 if you have a 30-pack-year history of smoking and currently smoke or have quit within the past 15 years. Fecal occult blood test (FOBT) of the stool. You may have this test every year starting at age 79. Flexible sigmoidoscopy or colonoscopy. You may have a sigmoidoscopy every 5 years or a colonoscopy every 10 years starting at age 40. Hepatitis C blood test. Hepatitis B blood test. Sexually transmitted disease (STD) testing. Diabetes screening. This is done by checking your blood sugar (glucose) after you have not eaten for a while (fasting). You may have this done every 1-3 years. Mammogram. This may be done every 1-2 years. Talk to your health care provider about when you should start having regular mammograms. This may depend on whether you have a family history of breast cancer. BRCA-related cancer screening. This may be  done if you have a family history of breast, ovarian, tubal, or peritoneal cancers. Pelvic exam and Pap test. This may be done every 3 years starting at age 66. Starting at age 74, this may be done every 5 years if you have a Pap test in combination with an HPV test. Bone density scan. This is done to screen for osteoporosis. You may have this scan if you are at high risk for osteoporosis. Discuss your test results,  treatment options, and if necessary, the need for more tests with your health care provider. Vaccines  Your health care provider may recommend certain vaccines, such as: Influenza vaccine. This is recommended every year. Tetanus, diphtheria, and acellular pertussis (Tdap, Td) vaccine. You may need a Td booster every 10 years. Zoster vaccine. You may need this after age 14. Pneumococcal 13-valent conjugate (PCV13) vaccine. You may need this if you have certain conditions and were not previously vaccinated. Pneumococcal polysaccharide (PPSV23) vaccine. You may need one or two doses if you smoke cigarettes or if you have certain conditions. Talk to your health care provider about which screenings and vaccines you need and how often you need them. This information is not intended to replace advice given to you by your health care provider. Make sure you discuss any questions you have with your health care provider. Document Released: 04/04/2015 Document Revised: 11/26/2015 Document Reviewed: 01/07/2015 Elsevier Interactive Patient Education  2017 [place] Prevention in the Home Falls can cause injuries. They can happen to people of all ages. There are many things you can do to make your home safe and to help prevent falls. What can I do on the outside of my home? Regularly fix the edges of walkways and driveways and fix any cracks. Remove anything that might make you trip as you walk through a door, such as a raised step or threshold. Trim any bushes or trees on the path to your home. Use bright outdoor lighting. Clear any walking paths of anything that might make someone trip, such as rocks or tools. Regularly check to see if handrails are loose or broken. Make sure that both sides of any steps have handrails. Any raised decks and porches should have guardrails on the edges. Have any leaves, snow, or ice cleared regularly. Use sand or salt on walking paths during winter. Clean  up any spills in your garage right away. This includes oil or grease spills. What can I do in the bathroom? Use night lights. Install grab bars by the toilet and in the tub and shower. Do not use towel bars as grab bars. Use non-skid mats or decals in the tub or shower. If you need to sit down in the shower, use a plastic, non-slip stool. Keep the floor dry. Clean up any water that spills on the floor as soon as it happens. Remove soap buildup in the tub or shower regularly. Attach bath mats securely with double-sided non-slip rug tape. Do not have throw rugs and other things on the floor that can make you trip. What can I do in the bedroom? Use night lights. Make sure that you have a light by your bed that is easy to reach. Do not use any sheets or blankets that are too big for your bed. They should not hang down onto the floor. Have a firm chair that has side arms. You can use this for support while you get dressed. Do not have throw rugs and other things on  the floor that can make you trip. What can I do in the kitchen? Clean up any spills right away. Avoid walking on wet floors. Keep items that you use a lot in easy-to-reach places. If you need to reach something above you, use a strong step stool that has a grab bar. Keep electrical cords out of the way. Do not use floor polish or wax that makes floors slippery. If you must use wax, use non-skid floor wax. Do not have throw rugs and other things on the floor that can make you trip. What can I do with my stairs? Do not leave any items on the stairs. Make sure that there are handrails on both sides of the stairs and use them. Fix handrails that are broken or loose. Make sure that handrails are as long as the stairways. Check any carpeting to make sure that it is firmly attached to the stairs. Fix any carpet that is loose or worn. Avoid having throw rugs at the top or bottom of the stairs. If you do have throw rugs, attach them to the  floor with carpet tape. Make sure that you have a light switch at the top of the stairs and the bottom of the stairs. If you do not have them, ask someone to add them for you. What else can I do to help prevent falls? Wear shoes that: Do not have high heels. Have rubber bottoms. Are comfortable and fit you well. Are closed at the toe. Do not wear sandals. If you use a stepladder: Make sure that it is fully opened. Do not climb a closed stepladder. Make sure that both sides of the stepladder are locked into place. Ask someone to hold it for you, if possible. Clearly mark and make sure that you can see: Any grab bars or handrails. First and last steps. Where the edge of each step is. Use tools that help you move around (mobility aids) if they are needed. These include: Canes. Walkers. Scooters. Crutches. Turn on the lights when you go into a dark area. Replace any light bulbs as soon as they burn out. Set up your furniture so you have a clear path. Avoid moving your furniture around. If any of your floors are uneven, fix them. If there are any pets around you, be aware of where they are. Review your medicines with your doctor. Some medicines can make you feel dizzy. This can increase your chance of falling. Ask your doctor what other things that you can do to help prevent falls. This information is not intended to replace advice given to you by your health care provider. Make sure you discuss any questions you have with your health care provider. Document Released: 01/02/2009 Document Revised: 08/14/2015 Document Reviewed: 04/12/2014 Elsevier Interactive Patient Education  2017 [employer].

## 2022-03-24 ENCOUNTER — Telehealth: Payer: Self-pay | Admitting: Family Medicine

## 2022-03-24 NOTE — Telephone Encounter (Signed)
Pt states endo is booked until Sept and she would like to know if we can refer her to another office that can see her sooner.

## 2022-03-26 NOTE — Telephone Encounter (Signed)
Pt called. LVM with below info.

## 2022-04-02 NOTE — Telephone Encounter (Signed)
[facility] called stating they have not received the referral, they need it sent to [phone].

## 2022-04-27 ENCOUNTER — Emergency Department (HOSPITAL_BASED_OUTPATIENT_CLINIC_OR_DEPARTMENT_OTHER): Payer: 59

## 2022-04-27 ENCOUNTER — Other Ambulatory Visit: Payer: Self-pay

## 2022-04-27 ENCOUNTER — Emergency Department (HOSPITAL_BASED_OUTPATIENT_CLINIC_OR_DEPARTMENT_OTHER)
Admission: EM | Admit: 2022-04-27 | Discharge: 2022-04-27 | Disposition: A | Discharge status: Home / Self Care | Payer: 59 | Attending: Emergency Medicine | Admitting: Emergency Medicine

## 2022-04-27 ENCOUNTER — Encounter (HOSPITAL_BASED_OUTPATIENT_CLINIC_OR_DEPARTMENT_OTHER): Payer: Self-pay

## 2022-04-27 ENCOUNTER — Telehealth: Payer: Self-pay | Admitting: Cardiology

## 2022-04-27 DIAGNOSIS — M5137 Other intervertebral disc degeneration, lumbosacral region: Secondary | ICD-10-CM | POA: Insufficient documentation

## 2022-04-27 DIAGNOSIS — M545 Low back pain, unspecified: Secondary | ICD-10-CM | POA: Insufficient documentation

## 2022-04-27 DIAGNOSIS — R001 Bradycardia, unspecified: Secondary | ICD-10-CM | POA: Insufficient documentation

## 2022-04-27 DIAGNOSIS — R079 Chest pain, unspecified: Secondary | ICD-10-CM | POA: Insufficient documentation

## 2022-04-27 DIAGNOSIS — I10 Essential (primary) hypertension: Secondary | ICD-10-CM | POA: Insufficient documentation

## 2022-04-27 DIAGNOSIS — I700 Atherosclerosis of aorta: Secondary | ICD-10-CM | POA: Insufficient documentation

## 2022-04-27 DIAGNOSIS — E039 Hypothyroidism, unspecified: Secondary | ICD-10-CM | POA: Diagnosis not present

## 2022-04-27 DIAGNOSIS — Z79899 Other long term (current) drug therapy: Secondary | ICD-10-CM | POA: Insufficient documentation

## 2022-04-27 DIAGNOSIS — R0789 Other chest pain: Secondary | ICD-10-CM | POA: Diagnosis not present

## 2022-04-27 DIAGNOSIS — K5730 Diverticulosis of large intestine without perforation or abscess without bleeding: Secondary | ICD-10-CM | POA: Diagnosis not present

## 2022-04-27 DIAGNOSIS — Z7982 Long term (current) use of aspirin: Secondary | ICD-10-CM | POA: Insufficient documentation

## 2022-04-27 DIAGNOSIS — R110 Nausea: Secondary | ICD-10-CM | POA: Diagnosis not present

## 2022-04-27 DIAGNOSIS — N200 Calculus of kidney: Secondary | ICD-10-CM | POA: Diagnosis not present

## 2022-04-27 DIAGNOSIS — M5136 Other intervertebral disc degeneration, lumbar region: Secondary | ICD-10-CM | POA: Diagnosis not present

## 2022-04-27 DIAGNOSIS — K8689 Other specified diseases of pancreas: Secondary | ICD-10-CM | POA: Diagnosis not present

## 2022-04-27 LAB — BASIC METABOLIC PANEL
Anion gap: 7 (ref 5–15)
BUN: 13 mg/dL (ref 6–20)
CO2: 24 mmol/L (ref 22–32)
Calcium: 10.7 mg/dL — ABNORMAL HIGH (ref 8.9–10.3)
Chloride: 108 mmol/L (ref 98–111)
Creatinine, Ser: 0.75 mg/dL (ref 0.44–1.00)
GFR, Estimated: >60 mL/min (ref >60)
Glucose, Bld: 119 mg/dL — ABNORMAL HIGH (ref 70–99)
Potassium: 3.8 mmol/L (ref 3.5–5.1)
Sodium: 139 mmol/L (ref 135–145)

## 2022-04-27 LAB — URINALYSIS, ROUTINE W REFLEX MICROSCOPIC
Bilirubin Urine: NEGATIVE
Glucose, UA: NEGATIVE mg/dL
Hgb urine dipstick: NEGATIVE
Ketones, ur: NEGATIVE mg/dL
Leukocytes,Ua: NEGATIVE
Nitrite: NEGATIVE
Protein, ur: NEGATIVE mg/dL
Specific Gravity, Urine: >=1.03 (ref 1.005–1.030)
pH: 5 (ref 5.0–8.0)

## 2022-04-27 LAB — CBC
HCT: 43.1 % (ref 36.0–46.0)
Hemoglobin: 14.1 g/dL (ref 12.0–15.0)
MCH: 30.2 pg (ref 26.0–34.0)
MCHC: 32.7 g/dL (ref 30.0–36.0)
MCV: 92.3 fL (ref 80.0–100.0)
Platelets: 302 10*3/uL (ref 150–400)
RBC: 4.67 MIL/uL (ref 3.87–5.11)
RDW: 12.5 % (ref 11.5–15.5)
WBC: 21.7 10*3/uL — ABNORMAL HIGH (ref 4.0–10.5)
nRBC: 0 % (ref 0.0–0.2)

## 2022-04-27 LAB — LIPASE, BLOOD: Lipase: 28 U/L (ref 11–51)

## 2022-04-27 LAB — HEPATIC FUNCTION PANEL
ALT: 12 U/L (ref 0–44)
AST: 20 U/L (ref 15–41)
Albumin: 4.5 g/dL (ref 3.5–5.0)
Alkaline Phosphatase: 131 U/L — ABNORMAL HIGH (ref 38–126)
Bilirubin, Direct: <0.1 mg/dL (ref 0.0–0.2)
Total Bilirubin: 0.6 mg/dL (ref 0.3–1.2)
Total Protein: 8 g/dL (ref 6.5–8.1)

## 2022-04-27 LAB — PREGNANCY, URINE: Preg Test, Ur: NEGATIVE

## 2022-04-27 LAB — TROPONIN I (HIGH SENSITIVITY): Troponin I (High Sensitivity): 3 ng/L (ref <18)

## 2022-04-27 NOTE — ED Provider Triage Note (Signed)
Emergency Medicine Provider Triage Evaluation Note  Brianna Fletcher , a 52 y.o. female  was evaluated in triage.  Pt complains of chest pain/back pain w/associated nausea for the last 2-3 days. No vomiting, no diarrhea. Endorses urinary frequency.  Review of Systems  Positive: Chest pain Negative: V/D  Physical Exam  BP 131/78   Pulse 61   Temp 98.6 F (37 C) (Oral)   Resp 20   Ht '5\' 6"'$  (1.676 m)   Wt 99.8 kg   LMP  (LMP Unknown)   SpO2 100%   BMI 35.51 kg/m  Gen:   Awake, no distress   Resp:  Normal effort  MSK:   Moves extremities without difficulty    Medical Decision Making  Medically screening exam initiated at 3:35 PM.  Appropriate orders placed.  Brianna Fletcher was informed that the remainder of the evaluation will be completed by another provider, this initial triage assessment does not replace that evaluation, and the importance of remaining in the ED until their evaluation is complete.     Osvaldo Shipper, [place] 04/27/22 1538

## 2022-04-27 NOTE — Telephone Encounter (Signed)
Pt c/o of Chest Pain: STAT if CP now or developed within 24 hours  1. Are you having CP right now? no  2. Are you experiencing any other symptoms (ex. SOB, nausea, vomiting, sweating)? nausea  3. How long have you been experiencing CP? 2-3 days  4. Is your CP continuous or coming and going? Comes and goes  5. Have you taken Nitroglycerin? no   Patient states she has been under a severe amount of stress and anxiety for the last couple days. She says she is not sure if that is causing her symptoms or not. She says the first day she had pain in her lower back and just thought she had hurt it lifting. She says yesterday it was her upper pain. She says she is not having symptoms now. She says she has also been having tingling in her right hand a lot for 1-2 weeks. She says at one time when she did not feel good she took an aspirin and is not sure if that was okay. She says she is also supposed to see a thyroid doctor, because her thyroid labs were not normal.   ?

## 2022-04-27 NOTE — ED Provider Notes (Signed)
[facility] Provider Note   CSN: [id number] Arrival date & time: 04/27/22  1258     History  Chief Complaint  Patient presents with   Chest Pain    Brianna Fletcher is a 52 y.o. female.  Patient is a 52 year old female with a past medical history of hypertension, myeloproliferative disorder and hypothyroidism presenting to the emergency department with nausea and low back pain.  Patient states that she has had nausea and intermittent low back pain for the last 2 to 3 days.  She states that she is still been able to eat and drink normally however the nausea has increased.  She states that she will get sharp intermittent pains across her lower back.  She denies any trauma or falls.  She states that she has had some suprapubic discomfort but denies any dysuria or hematuria.  She denies any fevers or chills.  She denies any chest pain or shortness of breath.  She states that she talked to her cardiologist that was concerned that she was having atypical ACS symptoms and recommended that she come to the ER to be evaluated.  The history is provided by the patient.  Chest Pain      Home Medications Prior to Admission medications   Medication Sig Start Date End Date Taking? Authorizing Provider  amLODipine (NORVASC) 10 MG tablet Take 1 tablet (10 mg total) by mouth daily. 02/01/22   Ann Held, DO  aspirin 81 MG tablet Take 81 mg by mouth daily.    [provider]  chlorhexidine (PERIDEX) 0.12 % solution Use as directed 5 mLs in the mouth or throat daily. 01/14/20   [provider]  Cholecalciferol (VITAMIN D3 PO) Take 2,000 Int'l Units by mouth daily.    [provider]  ezetimibe (ZETIA) 10 MG tablet Take 1 tablet (10 mg total) by mouth daily. 11/04/21   Richardo Priest, MD  furosemide (LASIX) 20 MG tablet Take 1 tablet (20 mg total) by mouth daily. Pt needs office visit for further refills 02/04/22   Carollee Herter, Yvonne R, DO  KRILL OIL PO Take 1,200 mg by mouth daily.    [provider]  levothyroxine (SYNTHROID) 50 MCG tablet TAKE 1 TABLET BY MOUTH EVERY DAY NEEDS OFFICE VISIT FOR REFILLS 02/04/22   Ann Held, DO  Magnesium 500 MG CAPS Take by mouth daily.     [provider]  omeprazole (PRILOSEC) 20 MG capsule TAKE 1 CAPSULE BY MOUTH EVERY DAY 02/04/22   Roma Schanz R, DO  zinc gluconate 50 MG tablet Take 50 mg by mouth daily.    [provider]      Allergies    Lisinopril, Losartan, Lubiprostone, Pravachol [pravastatin sodium], and Repatha [evolocumab]    Review of Systems   Review of Systems  Cardiovascular:  Positive for chest pain.    Physical Exam Updated Vital Signs BP 117/65   Pulse (!) 57   Temp 98.6 F (37 C)   Resp 20   Ht '5\' 6"'$  (1.676 m)   Wt 99.8 kg   LMP  (LMP Unknown)   SpO2 100%   BMI 35.51 kg/m  Physical Exam Vitals and nursing note reviewed.  Constitutional:      General: She is not in acute distress.    Appearance: She is well-developed. She is not toxic-appearing.  HENT:     Head: Normocephalic and atraumatic.  Eyes:  Extraocular Movements: Extraocular movements intact.  Cardiovascular:     Rate and Rhythm: Regular rhythm. Bradycardia present.     Pulses:          Radial pulses are 2+ on the right side and 2+ on the left side.     Heart sounds: Normal heart sounds.  Pulmonary:     Effort: Pulmonary effort is normal.     Breath sounds: Normal breath sounds.  Abdominal:     Palpations: Abdomen is soft.     Tenderness: There is no abdominal tenderness.     Comments: No CVA tenderness bilaterally  Musculoskeletal:        General: Normal range of motion.     Cervical back: Normal range of motion and neck supple.     Right lower leg: No edema.     Left lower leg: No edema.     Comments: No midline back tenderness  Skin:    General: Skin is warm and dry.  Neurological:     General: No focal  deficit present.     Mental Status: She is alert and oriented to person, place, and time.  Psychiatric:        Mood and Affect: Mood normal.        Behavior: Behavior normal.     ED Results / Procedures / Treatments   Labs (all labs ordered are listed, but only abnormal results are displayed) Labs Reviewed  BASIC METABOLIC PANEL - Abnormal; Notable for the following components:      Result Value   Glucose, Bld 119 (*)    Calcium 10.7 (*)    All other components within normal limits  CBC - Abnormal; Notable for the following components:   WBC 21.7 (*)    All other components within normal limits  URINALYSIS, ROUTINE W REFLEX MICROSCOPIC - Abnormal; Notable for the following components:   APPearance HAZY (*)    All other components within normal limits  HEPATIC FUNCTION PANEL - Abnormal; Notable for the following components:   Alkaline Phosphatase 131 (*)    All other components within normal limits  PREGNANCY, URINE  LIPASE, BLOOD  TROPONIN I (HIGH SENSITIVITY)    EKG EKG Interpretation  Date/Time:  Tuesday April 27 2022 13:12:06 EST Ventricular Rate:  56 PR Interval:  153 QRS Duration: 96 QT Interval:  398 QTC Calculation: 385 R Axis:   43 Text Interpretation: Sinus rhythm No significant change since last tracing Confirmed by Leanord Asal ([id number]) on 04/27/2022 5:21:32 PM  Radiology CT Renal Stone Study  Result Date: 04/27/2022 CLINICAL DATA:  Abdominal/flank pain.  Stone suspected EXAM: CT ABDOMEN AND PELVIS WITHOUT CONTRAST TECHNIQUE: Multidetector CT imaging of the abdomen and pelvis was performed following the standard protocol without IV contrast. RADIATION DOSE REDUCTION: This exam was performed according to the departmental dose-optimization program which includes automated exposure control, adjustment of the mA and/or kV according to patient size and/or use of iterative reconstruction technique. COMPARISON:  CT examination dated June 06, 2021 FINDINGS: Lower  chest: No acute abnormality. Hepatobiliary: No focal liver abnormality is seen. No gallstones, gallbladder wall thickening, or biliary dilatation. Pancreas: Moderate generalized pancreatic atrophy. No pancreatic ductal dilatation or surrounding inflammatory changes. Spleen: Normal in size without focal abnormality. Adrenals/Urinary Tract: Punctate nonobstructing calculus in the upper pole of the left kidney. No evidence of hydronephrosis or ureteral calculus no perinephric fat stranding. Urinary bladder is unremarkable. Stomach/Bowel: Stomach is within normal limits. Appendix appears normal. No evidence of bowel wall thickening,  distention, or inflammatory changes. Sigmoid colonic diverticulosis without evidence of acute diverticulitis Vascular/Lymphatic: Aortic atherosclerosis. No enlarged abdominal or pelvic lymph nodes. Reproductive: Status post hysterectomy. No adnexal masses. Other: No abdominal wall hernia or abnormality. No abdominopelvic ascites. Musculoskeletal: Multilevel degenerate disc disease of the lumbar spine prominent at L5-S1. No acute osseous abnormality IMPRESSION: 1. Punctate nonobstructing calculus in the upper pole of the left kidney. No evidence of hydronephrosis or ureteral calculus. 2. Sigmoid colonic diverticulosis without evidence of acute diverticulitis. 3. Aortic atherosclerosis. 4. Multilevel degenerate disc disease of the lumbar spine prominent at L5-S1. Aortic Atherosclerosis (ICD10-I70.0). Electronically Signed   By: Keane Police D.O.   On: 04/27/2022 17:59   DG Chest 2 View  Result Date: 04/27/2022 CLINICAL DATA:  Chest pain. EXAM: CHEST - 2 VIEW COMPARISON:  04/27/2018 FINDINGS: The cardiac silhouette, mediastinal and hilar contours are normal. The lungs are clear. No pleural effusions. No pulmonary lesions. The bony thorax is intact. IMPRESSION: No acute cardiopulmonary findings. Electronically Signed   By: Marijo Sanes M.D.   On: 04/27/2022 13:38    Procedures Procedures     Medications Ordered in ED Medications - No data to display  ED Course/ Medical Decision Making/ A&P Clinical Course as of 04/27/22 1908  Tue Apr 27, 2022  1907 Patient CT showed a nonobstructing stone otherwise no findings to explain her symptoms.  She is stable for discharge home with primary care follow-up and was given strict return precautions. [VK]    Clinical Course User Index [VK] Kemper Durie, DO                             Medical Decision Making This patient presents to the ED with chief complaint(s) of nausea, back pain with pertinent past medical history of HTN, hypothyroidism, myeloproliferative disorder which further complicates the presenting complaint. The complaint involves an extensive differential diagnosis and also carries with it a high risk of complications and morbidity.    The differential diagnosis includes atypical ACS, hepatitis, pancreatitis, gastroenteritis, dehydration, electrolyte abnormality, pyelonephritis, nephrolithiasis, UTI, muscle strain, viral syndrome  Additional history obtained: Additional history obtained from N/A Records reviewed cardiology records  ED Course and Reassessment: Patient was initially evaluated by provider in triage and had EKG, chest x-ray and labs performed.  EKG had no acute ischemic changes and initial troponin was negative.  Due to patient having several days of symptoms, single troponin is sufficient.  Remainder of labs had no acute abnormalities including a negative UA.  The patient does have a leukocytosis though this is consistent with her history of myeloproliferative disorder.  Due to her back pain and suprapubic pain, CT stone search will be performed to evaluate for kidney stone.  Independent labs interpretation:  The following labs were independently interpreted: Leukocytosis mildly elevated from baseline labs otherwise within normal range  Independent visualization of imaging: - I independently  visualized the following imaging with scope of interpretation limited to determining acute life threatening conditions related to emergency care: Chest x-ray, CT stone search, which revealed nonobstructing stone, otherwise no acute disease  Consultation: - Consulted or discussed management/test interpretation w/ external professional: N/A  Consideration for admission or further workup: Patient has no emergent conditions requiring admission or further work-up at this time and is stable for discharge home with primary care follow-up  Social Determinants of health: N/A    Amount and/or Complexity of Data Reviewed Labs: ordered. Radiology: ordered.  Final Clinical Impression(s) / ED Diagnoses Final diagnoses:  Nausea  Acute bilateral low back pain without sciatica    Rx / DC Orders ED Discharge Orders     None         Kemper Durie, DO 04/27/22 1908

## 2022-04-27 NOTE — ED Triage Notes (Signed)
Patient arrives to ED POV c/o chest pain, back pain and nausea. Pt states that she is under a lot of stress and was advised by her PCP to come to the ED. No other complaints at this time. Pt A/O x4

## 2022-04-27 NOTE — Telephone Encounter (Signed)
Pt reports having back pain and nausea for a few days but last night the pain was in her upper back with nausea. Pt advised to go to the ED for evaluation. Pt is not taking any statin medication only Zetia. Pt does not have NTG. Appointment made for 04/29/22.  Cardiac CT 08/29/20   ADDENDUM REPORT: 08/29/2020 15:07   HISTORY: Chest pain/anginal equiv, 35yrCHD risk > 20%, not treadmill candidate   EXAM: Cardiac/Coronary  CT   TECHNIQUE: The patient was scanned on a [employer]   PROTOCOL: A 120 kV prospective scan was triggered in the descending thoracic aorta at 111 HU's. Axial non-contrast 3 mm slices were carried out through the heart. The data set was analyzed on a dedicated work station and scored using the Agatston method. Gantry rotation speed was 250 msecs and collimation was .6 mm. Beta blockade and 0.8 mg of sl NTG was given. The 3D data set was reconstructed in 5% intervals of the 35-75 % of the R-R cycle. Systolic and diastolic phases were analyzed on a dedicated work station using MPR, MIP and VRT modes. The patient received 1041mOMNIPAQUE IOHEXOL 350 MG/ML SOLN contrast.   FINDINGS: Image quality: Adequate.   Noise artifact is: Moderate, reduced signal to noise ratio due to body habitus and mild respiratory motion artifact.   Coronary calcium score is 244, which places the patient in the 99th percentile for age and sex matched control.   Coronary arteries: Normal coronary origins.  Right dominance.   Right Coronary Artery: Minimal mixed atherosclerotic plaque in the proximal RCA and distal RCA, <25% stenosis.   Left Main Coronary Artery: Short left main with no detectable plaque or stenosis.   Left Anterior Descending Coronary Artery: Mild mixed atherosclerotic plaque in the proximal LAD, 25-49% stenosis. The lesion at the proximal to mid LAD has mild stenosis 25-49% stenosis with positive remodeling. Minimal mixed atherosclerotic plaque in the  mid LAD, <25% stenosis. Two diagonal branches which appear patent without detectable plaque or stenosis.   Left Circumflex Artery: Minimal mixed atherosclerotic plaque in the proximal L circumflex artery, <25% stenosis. 3 patent obtuse marginal branches.   Aorta: Normal size, 31 mm at the mid ascending aorta (level of the PA bifurcation) measured double oblique. No calcifications. No dissection.   Aortic Valve: No calcifications.   Other findings:   Normal pulmonary vein drainage into the left atrium.   Normal left atrial appendage without a thrombus.   Normal size of the pulmonary artery.   IMPRESSION: 1. Mild CAD, CADRADS = 2 in the proximal LAD.   2. Coronary calcium score is 244, which places the patient in the 99th percentile for age and sex matched control.   3. Normal coronary origin with right dominance.

## 2022-04-27 NOTE — ED Notes (Signed)
Called lab to add on lipase, hepatic function panel and urinalysis to previously collected lab/urine

## 2022-04-27 NOTE — Discharge Instructions (Signed)
You were seen in the emergency department for your nausea and your back pain.  Your workup showed no signs of heart attack, infection or kidney stones.  You do have some arthritis in your low back which may be contributing to some of your symptoms.  You should follow-up with your primary doctor in the next few days to have your symptoms rechecked and to determine if you may need further workup.  You should return to the emergency department if you have fevers, severe chest pain, repetitive vomiting or if you have any other new or concerning symptoms.

## 2022-04-28 NOTE — Progress Notes (Unsigned)
Cardiology Office Note:    Date:  04/29/2022   ID:  Brianna Fletcher, DOB 1971-03-18, MRN [id number]  PCP:  Carollee Herter, Alferd Apa, DO  Cardiologist:  Shirlee More, MD    Referring MD: Carollee Herter, Alferd Apa, *    ASSESSMENT:    1. Mild CAD   2. Agatston coronary artery calcium score between 200 and 399   3. Mixed hyperlipidemia   4. Statin intolerance   5. Essential hypertension    PLAN:    In order of problems listed above:  She has stable CAD presentation was unrelated in my opinion she has passed a kidney stone I told her I think there is value in following up with GI for ongoing symptoms of nausea history of reflux and abnormal appearance of the CT esophagus She clearly needs effective lipid-lowering treatment intolerant to PCSK9 inhibitor and statins on Zetia with residual LDL elevation and I cannot tell why that her benefits refuses bempedoic acid and if unable to fill we will refer her to the lipid clinic Stable hypertension continue current treatment    Next appointment: 6 months   Medication Adjustments/Labs and Tests Ordered: Current medicines are reviewed at length with the patient today.  Concerns regarding medicines are outlined above.  No orders of the defined types were placed in this encounter.  No orders of the defined types were placed in this encounter.   CAD follow-up   History of Present Illness:    Brianna Fletcher is a 52 y.o. female with a hx of mild CAD on cardiac CTA most severe 24 to 49% stenosis proximal LAD with an elevated coronary calcium score of 244 hypertension hyperlipidemia with statin intolerance and a myeloproliferative disorder last seen 10/08/2021 at that time LP(a) was elevated at 89.8.  She is seen [facility] ED yesterday with complaint of nausea back pain EKG showed no ischemic changes troponin was normal she has no stable leukocytosis thought to be due to her myeloproliferative disorder.  She had a CT scan stone study  showing a nonobstructing calculus upper pole the left kidney sigmoid colonic diverticulosis without evidence of diverticulitis and multilevel degenerative disc disease lumbar spine.  Plan chart reviewed over read CT scan from cardiac CTA 08/29/2020 showed esophageal air-fluid level suggesting dysmotility or gastroesophageal reflux.  Compliance with diet, lifestyle and medications: Yes  Is a very complex presentation She was in the emergency room with what sounds like renal colic had a CT done a stone was present but was told it was unrelated She had no chest pain presentation was not acute coronary syndrome high-sensitivity troponin and EKG were normal She is pending GI evaluation for reflux and has a low-grade constant nausea She is under intense stress with her parents deterioration mother in hospice in the nursing home frequent falls She is not having chest pain palpitation shortness of breath or syncope Past Medical History:  Diagnosis Date   Arthritis    Back pain    Blood dyscrasia    produces too many WBCs   Chronic neutrophilia 11/15/2012   Depression    Edema, lower extremity    Family history of heart disease    mother had stents placed in her 65s and had myocardial infarctions   Fibromyalgia    GERD (gastroesophageal reflux disease)    Glaucoma    Glaucoma    High cholesterol    Hypertension    IBS (irritable bowel syndrome)    Joint pain  Leukocytosis, unspecified 11/15/2012   Monocytosis 11/15/2012   Myeloproliferative disorder ([place]) 03/08/2013   Precordial chest pain    Pruritus 11/15/2012   Radial artery occlusion, right [facility])    status post  right radial artery cardiac catheterization   Thyroid disease    Vaginal delivery 1986, 2000   Vitamin D deficiency     Past Surgical History:  Procedure Laterality Date   CARDIAC CATHETERIZATION N/A 10/03/2014   Procedure: Left Heart Cath and Coronary Angiography;  Surgeon: Lorretta Harp, MD;  Location: [facility];  Service: Cardiovascular;  Laterality: N/A;   DILITATION & CURRETTAGE/HYSTROSCOPY WITH NOVASURE ABLATION N/A 11/08/2014   Procedure: DILATATION & CURETTAGE/HYSTEROSCOPY WITH NOVASURE ABLATION;  Surgeon: Linda Hedges, DO;  Location: [facility];  Service: Gynecology;  Laterality: N/A;   EYE SURGERY     laser eye surgery   LAPAROSCOPIC VAGINAL HYSTERECTOMY WITH SALPINGECTOMY Bilateral 03/05/2015   Procedure: LAPAROSCOPIC ASSISTED VAGINAL HYSTERECTOMY WITH SALPINGECTOMY;  Surgeon: Linda Hedges, DO;  Location: [facility];  Service: Gynecology;  Laterality: Bilateral;   TUBAL LIGATION  2000    Current Medications: Current Meds  Medication Sig   amLODipine (NORVASC) 10 MG tablet Take 1 tablet (10 mg total) by mouth daily.   aspirin 81 MG tablet Take 81 mg by mouth daily.   chlorhexidine (PERIDEX) 0.12 % solution Use as directed 5 mLs in the mouth or throat daily.   Cholecalciferol (VITAMIN D3) 125 MCG (5000 UT) CAPS Take 5,000 Int'l Units by mouth daily.   ezetimibe (ZETIA) 10 MG tablet Take 1 tablet (10 mg total) by mouth daily.   furosemide (LASIX) 20 MG tablet Take 1 tablet (20 mg total) by mouth daily. Pt needs office visit for further refills   KRILL OIL PO Take 1,200 mg by mouth daily.   levothyroxine (SYNTHROID) 50 MCG tablet TAKE 1 TABLET BY MOUTH EVERY DAY NEEDS OFFICE VISIT FOR REFILLS   MAGNESIUM PO Take 400 mg by mouth daily.   omeprazole (PRILOSEC) 20 MG capsule TAKE 1 CAPSULE BY MOUTH EVERY DAY   Zinc Gluconate 30 MG TABS Take 30 mg by mouth daily.     Allergies:   Lisinopril, Losartan, Lubiprostone, Pravachol [pravastatin sodium], and Repatha [evolocumab]   Social History   Socioeconomic History   Marital status: Divorced    Spouse name: Not on file   Number of children: Not on file   Years of education: Not on file   Highest education level: Not on file  Occupational History   Occupation: unemployed  Tobacco Use   Smoking status: Never   Smokeless tobacco: Never    Tobacco comments:    08/03/2021 Never smoked.  Vaping Use   Vaping Use: Never used  Substance and Sexual Activity   Alcohol use: Not Currently    Alcohol/week: 6.0 standard drinks of alcohol    Types: 6 Cans of beer per week   Drug use: No   Sexual activity: Not Currently    Partners: Male  Other Topics Concern   Not on file  Social History Narrative   Very little exercise   Social Determinants of Health   Financial Resource Strain: Low Risk  (12/10/2020)   Overall Financial Resource Strain (CARDIA)    Difficulty of Paying Living Expenses: Not hard at all  Food Insecurity: Food Insecurity Present (03/17/2022)   Hunger Vital Sign    Worried About Running Out of Food in the Last Year: Sometimes true    Ran Out of Food in the  Last Year: Never true  Transportation Needs: No Transportation Needs (03/17/2022)   PRAPARE - Hydrologist (Medical): No    Lack of Transportation (Non-Medical): No  Physical Activity: Inactive (12/10/2020)   Exercise Vital Sign    Days of Exercise per Week: 0 days    Minutes of Exercise per Session: 0 min  Stress: No Stress Concern Present (12/10/2020)   [place]    Feeling of Stress : Only a little  Social Connections: Moderately Isolated (12/10/2020)   Social Connection and Isolation Panel [NHANES]    Frequency of Communication with Friends and Family: More than three times a week    Frequency of Social Gatherings with Friends and Family: More than three times a week    Attends Religious Services: Never    Marine scientist or Organizations: No    Attends Music therapist: Never    Marital Status: Living with partner     Family History: The patient's family history includes Alcoholism in her father; Cancer in her father; Dementia in her maternal grandmother; Depression in her mother; Diabetes in her father; Heart disease in her cousin,  maternal grandmother, and mother; Heart disease (age of onset: 100) in her maternal grandfather; Hyperlipidemia in her mother; Hypertension in her mother; Obesity in her mother; Sleep apnea in her mother. There is no history of Other. ROS:   Please see the history of present illness.    All other systems reviewed and are negative.  EKGs/Labs/Other Studies Reviewed:    The following studies were reviewed today:   Recent Labs: 02/02/2022: TSH 0.92 04/27/2022: ALT 12; BUN 13; Creatinine, Ser 0.75; Hemoglobin 14.1; Platelets 302; Potassium 3.8; Sodium 139  Recent Lipid Panel    Component Value Date/Time   CHOL 190 02/02/2022 1111   CHOL 209 (H) 10/08/2021 1415   TRIG 212.0 (H) 02/02/2022 1111   HDL 48.00 02/02/2022 1111   HDL 47 10/08/2021 1415   CHOLHDL 4 02/02/2022 1111   VLDL 42.4 (H) 02/02/2022 1111   LDLCALC 127 (H) 10/08/2021 1415   LDLDIRECT 121.0 02/02/2022 1111    Physical Exam:    VS:  BP 118/74 (BP Location: Right Arm, Patient Position: Sitting)   Pulse (!) 56   Ht '5\' 6"'$  (1.676 m)   Wt 219 lb (99.3 kg)   LMP  (LMP Unknown)   SpO2 99%   BMI 35.35 kg/m     Wt Readings from Last 3 Encounters:  04/29/22 219 lb (99.3 kg)  04/27/22 220 lb (99.8 kg)  02/17/22 218 lb 6.4 oz (99.1 kg)     GEN:  Well nourished, well developed in no acute distress HEENT: Normal NECK: No JVD; No carotid bruits LYMPHATICS: No lymphadenopathy CARDIAC: RRR, no murmurs, rubs, gallops RESPIRATORY:  Clear to auscultation without rales, wheezing or rhonchi  ABDOMEN: Soft, non-tender, non-distended MUSCULOSKELETAL:  No edema; No deformity  SKIN: Warm and dry NEUROLOGIC:  Alert and oriented x 3 PSYCHIATRIC:  Normal affect   Seen with Leighton Ruff, CMA chaperone   Signed, Shirlee More, MD  04/29/2022 8:09 AM    [place]

## 2022-04-29 ENCOUNTER — Other Ambulatory Visit: Payer: Self-pay | Admitting: Cardiology

## 2022-04-29 ENCOUNTER — Ambulatory Visit: Payer: 59 | Attending: Cardiology | Admitting: Cardiology

## 2022-04-29 ENCOUNTER — Other Ambulatory Visit: Payer: Self-pay | Admitting: Family Medicine

## 2022-04-29 ENCOUNTER — Encounter: Payer: Self-pay | Admitting: Cardiology

## 2022-04-29 VITALS — BP 118/74 | HR 56 | Ht 66.0 in | Wt 219.0 lb

## 2022-04-29 DIAGNOSIS — I10 Essential (primary) hypertension: Secondary | ICD-10-CM | POA: Diagnosis not present

## 2022-04-29 DIAGNOSIS — E782 Mixed hyperlipidemia: Secondary | ICD-10-CM | POA: Diagnosis not present

## 2022-04-29 DIAGNOSIS — I2510 Atherosclerotic heart disease of native coronary artery without angina pectoris: Secondary | ICD-10-CM

## 2022-04-29 DIAGNOSIS — R600 Localized edema: Secondary | ICD-10-CM

## 2022-04-29 DIAGNOSIS — Z789 Other specified health status: Secondary | ICD-10-CM | POA: Diagnosis not present

## 2022-04-29 DIAGNOSIS — R931 Abnormal findings on diagnostic imaging of heart and coronary circulation: Secondary | ICD-10-CM | POA: Diagnosis not present

## 2022-04-29 MED ORDER — BEMPEDOIC ACID 180 MG PO TABS: 180.0000 mg | ORAL_TABLET | Freq: Every day | ORAL | 3 refills | Status: DC

## 2022-04-29 NOTE — Discharge Summary (Signed)
This visit was accompanied by Abdul Beirne, CMA.  

## 2022-04-29 NOTE — Patient Instructions (Addendum)
Medication Instructions:  Your physician has recommended you make the following change in your medication:   START: Bempedoic Acid 180 mg daily  *If you need a refill on your cardiac medications before your next appointment, please call your pharmacy*   Lab Work: None If you have labs (blood work) drawn today and your tests are completely normal, you will receive your results only by: [place] (if you have MyChart) OR A paper copy in the mail If you have any lab test that is abnormal or we need to change your treatment, we will call you to review the results.   Testing/Procedures: None   Follow-Up: At [facility], you and your health needs are our priority.  As part of our continuing mission to provide you with exceptional heart care, we have created designated Provider Care Teams.  These Care Teams include your primary Cardiologist (physician) and Advanced Practice Providers (APPs -  Physician Assistants and Nurse Practitioners) who all work together to provide you with the care you need, when you need it.  We recommend signing up for the patient portal called "MyChart".  Sign up information is provided on this After Visit Summary.  MyChart is used to connect with patients for Virtual Visits (Telemedicine).  Patients are able to view lab/test results, encounter notes, upcoming appointments, etc.  Non-urgent messages can be sent to your provider as well.   To learn more about what you can do with MyChart, go to [email or web address].    Your next appointment:   6 month(s)  Provider:   Shirlee More, MD    Other Instructions None  This visit was accompanied by Leighton Ruff, [place].

## 2022-05-03 ENCOUNTER — Ambulatory Visit: Payer: 59 | Admitting: Cardiology

## 2022-05-03 ENCOUNTER — Other Ambulatory Visit: Payer: Self-pay | Admitting: Family Medicine

## 2022-05-03 DIAGNOSIS — E041 Nontoxic single thyroid nodule: Secondary | ICD-10-CM

## 2022-05-03 DIAGNOSIS — D471 Chronic myeloproliferative disease: Secondary | ICD-10-CM

## 2022-05-03 DIAGNOSIS — E039 Hypothyroidism, unspecified: Secondary | ICD-10-CM

## 2022-05-04 ENCOUNTER — Telehealth: Payer: Self-pay

## 2022-05-04 NOTE — Telephone Encounter (Signed)
Prior authorization submitted with CMM  Brianna Fletcher Key: [id number] - [place] Case ID: [id number] Need help? Call us at [phone] Status Sent to Plan today Drug Nexletol 180MG tablets

## 2022-05-20 ENCOUNTER — Inpatient Hospital Stay: Payer: 59 | Attending: Hematology & Oncology

## 2022-05-20 ENCOUNTER — Inpatient Hospital Stay (HOSPITAL_BASED_OUTPATIENT_CLINIC_OR_DEPARTMENT_OTHER): Payer: 59 | Admitting: Medical Oncology

## 2022-05-20 ENCOUNTER — Other Ambulatory Visit: Payer: Self-pay

## 2022-05-20 ENCOUNTER — Encounter: Payer: Self-pay | Admitting: Medical Oncology

## 2022-05-20 VITALS — BP 124/63 | HR 58 | Temp 98.0°F | Resp 18 | Ht 66.0 in | Wt 221.0 lb

## 2022-05-20 DIAGNOSIS — E039 Hypothyroidism, unspecified: Secondary | ICD-10-CM | POA: Insufficient documentation

## 2022-05-20 DIAGNOSIS — E042 Nontoxic multinodular goiter: Secondary | ICD-10-CM

## 2022-05-20 DIAGNOSIS — D471 Chronic myeloproliferative disease: Secondary | ICD-10-CM

## 2022-05-20 LAB — CMP (CANCER CENTER ONLY)
ALT: 12 U/L (ref 0–44)
AST: 14 U/L — ABNORMAL LOW (ref 15–41)
Albumin: 4.9 g/dL (ref 3.5–5.0)
Alkaline Phosphatase: 128 U/L — ABNORMAL HIGH (ref 38–126)
Anion gap: 8 (ref 5–15)
BUN: 11 mg/dL (ref 6–20)
CO2: 29 mmol/L (ref 22–32)
Calcium: 11.2 mg/dL — ABNORMAL HIGH (ref 8.9–10.3)
Chloride: 101 mmol/L (ref 98–111)
Creatinine: 0.57 mg/dL (ref 0.44–1.00)
GFR, Estimated: >60 mL/min (ref >60)
Glucose, Bld: 98 mg/dL (ref 70–99)
Potassium: 4.4 mmol/L (ref 3.5–5.1)
Sodium: 138 mmol/L (ref 135–145)
Total Bilirubin: 0.6 mg/dL (ref 0.3–1.2)
Total Protein: 7.6 g/dL (ref 6.5–8.1)

## 2022-05-20 LAB — CBC WITH DIFFERENTIAL (CANCER CENTER ONLY)
Abs Immature Granulocytes: 0.21 10*3/uL — ABNORMAL HIGH (ref 0.00–0.07)
Basophils Absolute: 0.1 10*3/uL (ref 0.0–0.1)
Basophils Relative: 0 %
Eosinophils Absolute: 0.1 10*3/uL (ref 0.0–0.5)
Eosinophils Relative: 1 %
HCT: 43.5 % (ref 36.0–46.0)
Hemoglobin: 13.8 g/dL (ref 12.0–15.0)
Immature Granulocytes: 1 %
Lymphocytes Relative: 18 %
Lymphs Abs: 2.6 10*3/uL (ref 0.7–4.0)
MCH: 30 pg (ref 26.0–34.0)
MCHC: 31.7 g/dL (ref 30.0–36.0)
MCV: 94.6 fL (ref 80.0–100.0)
Monocytes Absolute: 0.8 10*3/uL (ref 0.1–1.0)
Monocytes Relative: 5 %
Neutro Abs: 11.1 10*3/uL — ABNORMAL HIGH (ref 1.7–7.7)
Neutrophils Relative %: 75 %
Platelet Count: 299 10*3/uL (ref 150–400)
RBC: 4.6 MIL/uL (ref 3.87–5.11)
RDW: 12.3 % (ref 11.5–15.5)
WBC Count: 14.9 10*3/uL — ABNORMAL HIGH (ref 4.0–10.5)
nRBC: 0 % (ref 0.0–0.2)

## 2022-05-20 LAB — SAVE SMEAR(SSMR), FOR PROVIDER SLIDE REVIEW

## 2022-05-20 LAB — LACTATE DEHYDROGENASE: LDH: 150 U/L (ref 98–192)

## 2022-05-20 NOTE — Progress Notes (Addendum)
Hematology and Oncology Follow Up Visit  Brianna Fletcher [id number] 23-Mar-1970 52 y.o. 05/20/2022   Principle Diagnosis:  Chronic myeloproliferative syndrome-Triple negative Sub-clinical hypothyroidism (+) p-ANCA  Current Therapy:   Peg-Interferon q weekly - q 4 week dosing - On hold due to cost Synthroid 0.050 mg po q day   Interim History: Brianna Fletcher is here today for follow-up.    Unfortunately since her last visit she has been under even more stress. Her parents health is failing and her boyfriend was just diagnosed with significant pulmonary fibrosis and lung cancer. He is not doing well.   She also has been seen by the ER for an episodes where she was having flow back pain and nausea. Had CT renal study and cardiac work up. Had follow up with her cardiologist on 04/29/2022 who reviewed her CT scan images and saw a possible lesion near her esophagus. He referred her to GI endoscopy for potential abnormality of esophagus.   She also continues to be followed by her PCP for elevated PTH, hypercalcemia and has been referred to endocrinology. Unfortunately her appointment with them isn't until June 2024.   In terms of the items we are following, she denies unintentional weight loss, night sweats, new bone pains. No bleeding episodes or significant bruising episodes.   Currently, I would say performance status is probably ECOG 1.       Wt Readings from Last 3 Encounters:  05/20/22 221 lb (100.2 kg)  04/29/22 219 lb (99.3 kg)  04/27/22 220 lb (99.8 kg)    Medications:  Allergies as of 05/20/2022       Reactions   Lisinopril Cough   Losartan Cough   Lubiprostone Nausea And Vomiting   Pravachol [pravastatin Sodium] Other (See Comments)   "Causes pain in my body"   Repatha [evolocumab] Cough   Severe cough that would not respond to any treatment - per patient.        Medication List        Accurate as of May 20, 2022 12:50 PM. If you have any questions, ask your  nurse or doctor.          amLODipine 10 MG tablet Commonly known as: NORVASC Take 1 tablet (10 mg total) by mouth daily.   aspirin 81 MG tablet Take 81 mg by mouth daily.   Bempedoic Acid 180 MG Tabs Take 1 tablet (180 mg total) by mouth daily.   chlorhexidine 0.12 % solution Commonly known as: PERIDEX Use as directed 5 mLs in the mouth or throat daily.   ezetimibe 10 MG tablet Commonly known as: ZETIA Take 1 tablet (10 mg total) by mouth daily.   furosemide 20 MG tablet Commonly known as: LASIX Take 1 tablet (20 mg total) by mouth daily.   KRILL OIL PO Take 1,200 mg by mouth daily.   levothyroxine 50 MCG tablet Commonly known as: SYNTHROID TAKE 1 TABLET BY MOUTH EVERY DAY   MAGNESIUM PO Take 400 mg by mouth daily.   omeprazole 20 MG capsule Commonly known as: PRILOSEC TAKE 1 CAPSULE BY MOUTH EVERY DAY   Vitamin D3 125 MCG (5000 UT) capsule Generic drug: Cholecalciferol Take 5,000 Int'l Units by mouth daily.   Zinc Gluconate 30 MG Tabs Take 30 mg by mouth daily.        Allergies:  Allergies  Allergen Reactions   Lisinopril Cough   Losartan Cough   Lubiprostone Nausea And Vomiting   Pravachol [Pravastatin Sodium] Other (See Comments)    "  Causes pain in my body"   Repatha [Evolocumab] Cough    Severe cough that would not respond to any treatment - per patient.    Past Medical History, Surgical history, Social history, and Family History were reviewed and updated.  Review of Systems: Review of Systems  Constitutional:  Positive for malaise/fatigue.  HENT: Negative.    Eyes: Negative.   Respiratory: Negative.    Cardiovascular: Negative.   Gastrointestinal:  Positive for diarrhea.  Genitourinary: Negative.   Musculoskeletal:  Positive for myalgias.  Skin: Negative.   Neurological:  Positive for dizziness.  Psychiatric/Behavioral: Negative.       Physical Exam:  height is '5\' 6"'$  (1.676 m) and weight is 221 lb (100.2 kg). Her oral  temperature is 98 F (36.7 C). Her blood pressure is 124/63 and her pulse is 58 (abnormal). Her respiration is 18 and oxygen saturation is 100%.   Wt Readings from Last 3 Encounters:  05/20/22 221 lb (100.2 kg)  04/29/22 219 lb (99.3 kg)  04/27/22 220 lb (99.8 kg)    Physical Exam Vitals reviewed.  HENT:     Head: Normocephalic and atraumatic.  Eyes:     Pupils: Pupils are equal, round, and reactive to light.  Cardiovascular:     Rate and Rhythm: Normal rate and regular rhythm.     Heart sounds: Normal heart sounds.  Pulmonary:     Effort: Pulmonary effort is normal.     Breath sounds: Normal breath sounds.  Abdominal:     General: Bowel sounds are normal.     Palpations: Abdomen is soft.  Musculoskeletal:        General: No tenderness or deformity. Normal range of motion.     Cervical back: Normal range of motion.  Lymphadenopathy:     Cervical: No cervical adenopathy.  Skin:    General: Skin is warm and dry.     Findings: No erythema or rash.  Neurological:     Mental Status: She is alert and oriented to person, place, and time.  Psychiatric:        Behavior: Behavior normal.        Thought Content: Thought content normal.        Judgment: Judgment normal.    Lab Results  Component Value Date   WBC 14.9 (H) 05/20/2022   HGB 13.8 05/20/2022   HCT 43.5 05/20/2022   MCV 94.6 05/20/2022   PLT 299 05/20/2022   Lab Results  Component Value Date   FERRITIN 60 08/03/2021   IRON 89 08/03/2021   TIBC 350 08/03/2021   UIBC 261 08/03/2021   IRONPCTSAT 25 08/03/2021   Lab Results  Component Value Date   RETICCTPCT 2.1 04/12/2017   RBC 4.60 05/20/2022   RETICCTABS 116.1 11/20/2013   No results found for: "KPAFRELGTCHN", "LAMBDASER", "KAPLAMBRATIO" No results found for: "IGGSERUM", "IGA", "IGMSERUM" No results found for: "TOTALPROTELP", "ALBUMINELP", "A1GS", "A2GS", "BETS", "BETA2SER", "GAMS", "MSPIKE", "SPEI"   Chemistry      Component Value Date/Time   NA 138  05/20/2022 1006   NA 138 10/08/2021 1415   NA 140 02/08/2017 0925   NA 136 03/10/2016 1147   K 4.4 05/20/2022 1006   K 3.4 02/08/2017 0925   K 3.9 03/10/2016 1147   CL 101 05/20/2022 1006   CL 105 02/08/2017 0925   CO2 29 05/20/2022 1006   CO2 26 02/08/2017 0925   CO2 24 03/10/2016 1147   BUN 11 05/20/2022 1006   BUN 9 10/08/2021 1415  BUN 10 02/08/2017 0925   BUN 10.2 03/10/2016 1147   CREATININE 0.57 05/20/2022 1006   CREATININE 0.7 02/08/2017 0925   CREATININE 0.7 03/10/2016 1147      Component Value Date/Time   CALCIUM 11.2 (H) 05/20/2022 1006   CALCIUM 10.4 (H) 02/08/2017 0925   CALCIUM 10.7 (H) 03/10/2016 1147   ALKPHOS 128 (H) 05/20/2022 1006   ALKPHOS 106 (H) 02/08/2017 0925   ALKPHOS 132 03/10/2016 1147   AST 14 (L) 05/20/2022 1006   AST 12 03/10/2016 1147   ALT 12 05/20/2022 1006   ALT 16 02/08/2017 0925   ALT 8 03/10/2016 1147   BILITOT 0.6 05/20/2022 1006   BILITOT 0.63 03/10/2016 1147     Encounter Diagnoses  Name Primary?   Multiple thyroid nodules Yes   Myeloproliferative disorder (HCC)    Hypercalcemia     Impression and Plan: Ms. Keister is a very pleasant 52 yo caucasian female with a triple negative myeloproliferative syndrome though all work up has been negative so far.   I have reviewed her recent CT renal study and I do see any significant abnormality though I discussed with patient that I am not as familiar with reading them. I will reach out to radiology to gain education to see if this is an abnormality or potentially her thyroid nodule. She has had one thyroid US many years ago without repeat. I will order today. She has been advised to keep her appointment with GI and with endocrinology.   We reviewed her labs we obtained today which show improving findings which are reassuring.   Prayers to her and her family for this difficult time.   We will still plan to get her back in 3 months for follow-up.  I think this is appropriate.  *spoke  with Dr. Genene Churn regarding the imaging. Benign appearing although June CT chest did show some evidence of GERD.   Hughie Closs, PA-C 2/29/202412:50 PM

## 2022-05-20 NOTE — Addendum Note (Signed)
Addended by: Nelwyn Salisbury on: 05/20/2022 01:13 PM   Modules accepted: Orders

## 2022-06-07 ENCOUNTER — Other Ambulatory Visit: Payer: Self-pay | Admitting: Family Medicine

## 2022-06-07 DIAGNOSIS — E041 Nontoxic single thyroid nodule: Secondary | ICD-10-CM

## 2022-06-07 DIAGNOSIS — E039 Hypothyroidism, unspecified: Secondary | ICD-10-CM

## 2022-06-07 DIAGNOSIS — D471 Chronic myeloproliferative disease: Secondary | ICD-10-CM

## 2022-07-06 ENCOUNTER — Other Ambulatory Visit: Payer: Self-pay | Admitting: Family Medicine

## 2022-07-06 DIAGNOSIS — I10 Essential (primary) hypertension: Secondary | ICD-10-CM

## 2022-07-28 ENCOUNTER — Other Ambulatory Visit: Payer: Self-pay | Admitting: Family Medicine

## 2022-07-28 DIAGNOSIS — R600 Localized edema: Secondary | ICD-10-CM

## 2022-07-30 ENCOUNTER — Other Ambulatory Visit: Payer: Self-pay | Admitting: Pharmacist

## 2022-07-30 NOTE — Progress Notes (Signed)
Patient was noted to have failed SPC measure in 2023.   She is noted to have hx of mild CAD on cardiac CTA with an elevated coronary calcium score of 244 and  elevated LP(a) at 89.8. Last LDL was 121 (01/2022)  She also has myeloproliferative disorder and fibromyalgia.   She has tried pravastatin in 2022 and was unable to tolerated even a low dose of 20mg  2 times per week.  Pravastatin cause whole body aches.  Tried Repatha from 10/2020 to 02/2021 - stopped due to cough.  She is currently taking ezetimibe 10mg  daily and Bempedoic acid   It looks like she has statin myopathy on her problem list.  Messaged PCP prior to her upcoming appointment 08/03/2022 to assess for history of statin myalgia, document and use exclusion code as appropriate.   She will also see Dr Dulce Sellar with cardiology  / lipid clinic in July 2024 (per recall list)

## 2022-08-03 ENCOUNTER — Ambulatory Visit (HOSPITAL_BASED_OUTPATIENT_CLINIC_OR_DEPARTMENT_OTHER)
Admission: RE | Admit: 2022-08-03 | Discharge: 2022-08-03 | Disposition: A | Discharge status: Home / Self Care | Payer: 59 | Source: Ambulatory Visit | Attending: Family Medicine | Admitting: Family Medicine

## 2022-08-03 ENCOUNTER — Encounter: Payer: Self-pay | Admitting: Family Medicine

## 2022-08-03 ENCOUNTER — Ambulatory Visit (INDEPENDENT_AMBULATORY_CARE_PROVIDER_SITE_OTHER): Payer: 59 | Admitting: Family Medicine

## 2022-08-03 ENCOUNTER — Other Ambulatory Visit: Payer: Self-pay | Admitting: Family Medicine

## 2022-08-03 VITALS — BP 138/80 | HR 68 | Temp 97.8°F | Resp 18 | Ht 66.0 in | Wt 216.4 lb

## 2022-08-03 DIAGNOSIS — M79675 Pain in left toe(s): Secondary | ICD-10-CM

## 2022-08-03 DIAGNOSIS — E785 Hyperlipidemia, unspecified: Secondary | ICD-10-CM | POA: Diagnosis not present

## 2022-08-03 DIAGNOSIS — E213 Hyperparathyroidism, unspecified: Secondary | ICD-10-CM

## 2022-08-03 DIAGNOSIS — E039 Hypothyroidism, unspecified: Secondary | ICD-10-CM

## 2022-08-03 DIAGNOSIS — F418 Other specified anxiety disorders: Secondary | ICD-10-CM

## 2022-08-03 DIAGNOSIS — S80862A Insect bite (nonvenomous), left lower leg, initial encounter: Secondary | ICD-10-CM | POA: Diagnosis not present

## 2022-08-03 DIAGNOSIS — D471 Chronic myeloproliferative disease: Secondary | ICD-10-CM

## 2022-08-03 DIAGNOSIS — W57XXXA Bitten or stung by nonvenomous insect and other nonvenomous arthropods, initial encounter: Secondary | ICD-10-CM

## 2022-08-03 DIAGNOSIS — R600 Localized edema: Secondary | ICD-10-CM

## 2022-08-03 DIAGNOSIS — I10 Essential (primary) hypertension: Secondary | ICD-10-CM | POA: Diagnosis not present

## 2022-08-03 LAB — COMPREHENSIVE METABOLIC PANEL
ALT: 9 U/L (ref 0–35)
AST: 16 U/L (ref 0–37)
Albumin: 4.8 g/dL (ref 3.5–5.2)
Alkaline Phosphatase: 110 U/L (ref 39–117)
BUN: 11 mg/dL (ref 6–23)
CO2: 27 mEq/L (ref 19–32)
Calcium: 11.1 mg/dL — ABNORMAL HIGH (ref 8.4–10.5)
Chloride: 103 mEq/L (ref 96–112)
Creatinine, Ser: 0.54 mg/dL (ref 0.40–1.20)
GFR: 104.43 mL/min (ref >60.00)
Glucose, Bld: 85 mg/dL (ref 70–99)
Potassium: 4.3 mEq/L (ref 3.5–5.1)
Sodium: 139 mEq/L (ref 135–145)
Total Bilirubin: 0.6 mg/dL (ref 0.2–1.2)
Total Protein: 7.8 g/dL (ref 6.0–8.3)

## 2022-08-03 LAB — CBC WITH DIFFERENTIAL/PLATELET
Basophils Absolute: 0.1 10*3/uL (ref 0.0–0.1)
Basophils Relative: 0.9 % (ref 0.0–3.0)
Eosinophils Absolute: 0.2 10*3/uL (ref 0.0–0.7)
Eosinophils Relative: 1.2 % (ref 0.0–5.0)
HCT: 40.7 % (ref 36.0–46.0)
Hemoglobin: 13.4 g/dL (ref 12.0–15.0)
Lymphocytes Relative: 12.5 % (ref 12.0–46.0)
Lymphs Abs: 2 10*3/uL (ref 0.7–4.0)
MCHC: 32.8 g/dL (ref 30.0–36.0)
MCV: 92.5 fl (ref 78.0–100.0)
Monocytes Absolute: 0.9 10*3/uL (ref 0.1–1.0)
Monocytes Relative: 5.6 % (ref 3.0–12.0)
Neutro Abs: 12.8 10*3/uL — ABNORMAL HIGH (ref 1.4–7.7)
Neutrophils Relative %: 79.8 % — ABNORMAL HIGH (ref 43.0–77.0)
Platelets: 341 10*3/uL (ref 150.0–400.0)
RBC: 4.4 Mil/uL (ref 3.87–5.11)
RDW: 13.2 % (ref 11.5–15.5)
WBC: 16.1 10*3/uL — ABNORMAL HIGH (ref 4.0–10.5)

## 2022-08-03 LAB — LIPID PANEL
Cholesterol: 148 mg/dL (ref 0–200)
HDL: 55.9 mg/dL (ref >39.00)
LDL Cholesterol: 75 mg/dL (ref 0–99)
NonHDL: 91.79
Total CHOL/HDL Ratio: 3
Triglycerides: 85 mg/dL (ref 0.0–149.0)
VLDL: 17 mg/dL (ref 0.0–40.0)

## 2022-08-03 LAB — VITAMIN D 25 HYDROXY (VIT D DEFICIENCY, FRACTURES): VITD: 26.67 ng/mL — ABNORMAL LOW (ref 30.00–100.00)

## 2022-08-03 LAB — TSH: TSH: 0.41 u[IU]/mL (ref 0.35–5.50)

## 2022-08-03 MED ORDER — TRIAMCINOLONE ACETONIDE 0.1 % EX CREA: 1.0000 | TOPICAL_CREAM | Freq: Two times a day (BID) | CUTANEOUS | 0 refills | Status: DC

## 2022-08-03 MED ORDER — HYDROXYZINE PAMOATE 25 MG PO CAPS: 25.0000 mg | ORAL_CAPSULE | Freq: Three times a day (TID) | ORAL | 0 refills | Status: DC | PRN

## 2022-08-03 MED ORDER — CEPHALEXIN 500 MG PO CAPS: 500.0000 mg | ORAL_CAPSULE | Freq: Two times a day (BID) | ORAL | 0 refills | Status: DC

## 2022-08-03 MED ORDER — ESCITALOPRAM OXALATE 10 MG PO TABS: 10.0000 mg | ORAL_TABLET | Freq: Every day | ORAL | 3 refills | Status: DC

## 2022-08-03 MED ORDER — FUROSEMIDE 20 MG PO TABS: 20.0000 mg | ORAL_TABLET | Freq: Two times a day (BID) | ORAL | 0 refills | Status: DC

## 2022-08-03 NOTE — Assessment & Plan Note (Signed)
Triamcinolone to area  Keflex 500 bid x 10 days

## 2022-08-03 NOTE — Progress Notes (Addendum)
Subjective:   By signing my name below, I, Shehryar Baig, attest that this documentation has been prepared under the direction and in the presence of Donato Schultz, DO. 08/03/2022   Patient ID: Brianna Fletcher, female    DOB: 14-Sep-1970, 52 y.o.   MRN: [id number]  Chief Complaint  Patient presents with   Anxiety    Pt states dealing with a lot of family issues and boyfriend has lung cancer.    Fall    Two Saturdays ago, swelling in foot and ankle, left foot.     Anxiety Symptoms include insomnia and nervous/anxious behavior. Patient reports no chest pain, dizziness, nausea, palpitations or shortness of breath.    Fall Pertinent negatives include no abdominal pain, fever, headaches, loss of consciousness or nausea.   Patient is in today for a office visit.   She reports falling on 07/25/2022. She landed on her buttocks during the fall. She had no pain initially but developed swelling, bruising on her left great toe and foot. Since then she has pain in her left great toe and swelling in her left foot, ankle.  She also developed a bruise on her left ankle since yesterday. She is not sure of the cause and does not think it is related to her prior injury. She has not taken nor applied any medication to her left ankle or foot.  She complains swelling in her legs. She is on her feet throughout the day more frequently and longer. Her left ankle is swelling more but she thinks it is due to her prior injury. She continues taking 20 mg Lasix daily PO and is willing to take it 2x daily to help manage her symptoms.  She set up an appointment with a new endocrinologist for next month.  She complains of anxiety due to her family events. Her mother gives her anxiety due to needing her wanting her to visit her frequently. Her fathers health condition is declining and might need to become bedridden. Her boyfriend was diagnosed with lung cancer last month. Since his biopsy his oxygen levels became  low and he needed to take supplemental oxygen. He is currently in the hospital due to his oxygen levels dropping dangerously low and has been there for the past 3 weeks. She is also stressed due to moving to another house. She feels lonely due to her family members she is living with her in hospital care and her children living far from her. She has occasional insomnia due to racing thoughts. Many of her friends moved far due to work.    Past Medical History:  Diagnosis Date   Arthritis    Back pain    Blood dyscrasia    produces too many WBCs   Chronic neutrophilia 11/15/2012   Depression    Edema, lower extremity    Family history of heart disease    mother had stents placed in her 31s and had myocardial infarctions   Fibromyalgia    GERD (gastroesophageal reflux disease)    Glaucoma    Glaucoma    High cholesterol    Hypertension    IBS (irritable bowel syndrome)    Joint pain    Leukocytosis, unspecified 11/15/2012   Monocytosis 11/15/2012   Myeloproliferative disorder (HCC) 03/08/2013   Precordial chest pain    Pruritus 11/15/2012   Radial artery occlusion, right (HCC)    status post  right radial artery cardiac catheterization   Thyroid disease    Vaginal delivery 1986,  2000   Vitamin D deficiency     Past Surgical History:  Procedure Laterality Date   CARDIAC CATHETERIZATION N/A 10/03/2014   Procedure: Left Heart Cath and Coronary Angiography;  Surgeon: Runell Gess, MD;  Location: [facility] INVASIVE CV LAB;  Service: Cardiovascular;  Laterality: N/A;   DILITATION & CURRETTAGE/HYSTROSCOPY WITH NOVASURE ABLATION N/A 11/08/2014   Procedure: DILATATION & CURETTAGE/HYSTEROSCOPY WITH NOVASURE ABLATION;  Surgeon: Mitchel Honour, DO;  Location: [facility];  Service: Gynecology;  Laterality: N/A;   EYE SURGERY     laser eye surgery   LAPAROSCOPIC VAGINAL HYSTERECTOMY WITH SALPINGECTOMY Bilateral 03/05/2015   Procedure: LAPAROSCOPIC ASSISTED VAGINAL HYSTERECTOMY WITH SALPINGECTOMY;   Surgeon: Mitchel Honour, DO;  Location: [facility];  Service: Gynecology;  Laterality: Bilateral;   TUBAL LIGATION  2000    Family History  Problem Relation Age of Onset   Heart disease Mother    Hypertension Mother    Hyperlipidemia Mother    Depression Mother    Sleep apnea Mother    Obesity Mother    Diabetes Father    Cancer Father        esopageal ?   Alcoholism Father    Dementia Maternal Grandmother    Heart disease Maternal Grandmother    Heart disease Maternal Grandfather 2       MI   Heart disease Cousin    Other Neg Hx        hyperparathyroidism    Social History   Socioeconomic History   Marital status: Divorced    Spouse name: Not on file   Number of children: Not on file   Years of education: Not on file   Highest education level: Not on file  Occupational History   Occupation: unemployed  Tobacco Use   Smoking status: Never   Smokeless tobacco: Never   Tobacco comments:    08/03/2021 Never smoked.  Vaping Use   Vaping Use: Never used  Substance and Sexual Activity   Alcohol use: Not Currently    Alcohol/week: 6.0 standard drinks of alcohol    Types: 6 Cans of beer per week   Drug use: No   Sexual activity: Not Currently    Partners: Male  Other Topics Concern   Not on file  Social History Narrative   Very little exercise   Social Determinants of Health   Financial Resource Strain: Low Risk  (12/10/2020)   Overall Financial Resource Strain (CARDIA)    Difficulty of Paying Living Expenses: Not hard at all  Food Insecurity: Food Insecurity Present (03/17/2022)   Hunger Vital Sign    Worried About Running Out of Food in the Last Year: Sometimes true    Ran Out of Food in the Last Year: Never true  Transportation Needs: No Transportation Needs (03/17/2022)   PRAPARE - Administrator, Civil Service (Medical): No    Lack of Transportation (Non-Medical): No  Physical Activity: Inactive (12/10/2020)   Exercise Vital Sign    Days of  Exercise per Week: 0 days    Minutes of Exercise per Session: 0 min  Stress: No Stress Concern Present (12/10/2020)   [employer] of Occupational Health - Occupational Stress Questionnaire    Feeling of Stress : Only a little  Social Connections: Moderately Isolated (12/10/2020)   Social Connection and Isolation Panel [NHANES]    Frequency of Communication with Friends and Family: More than three times a week    Frequency of Social Gatherings with Friends and Family: More than  three times a week    Attends Religious Services: Never    Active Member of Clubs or Organizations: No    Attends Banker Meetings: Never    Marital Status: Living with partner  Intimate Partner Violence: Not At Risk (03/17/2022)   Humiliation, Afraid, Rape, and Kick questionnaire    Fear of Current or Ex-Partner: No    Emotionally Abused: No    Physically Abused: No    Sexually Abused: No    Outpatient Medications Prior to Visit  Medication Sig Dispense Refill   amLODipine (NORVASC) 10 MG tablet Take 1 tablet (10 mg total) by mouth daily. 90 tablet 1   aspirin 81 MG tablet Take 81 mg by mouth daily.     Bempedoic Acid 180 MG TABS Take 1 tablet (180 mg total) by mouth daily. 90 tablet 3   chlorhexidine (PERIDEX) 0.12 % solution Use as directed 5 mLs in the mouth or throat daily.     Cholecalciferol (VITAMIN D3) 125 MCG (5000 UT) CAPS Take 5,000 Int'l Units by mouth daily.     ezetimibe (ZETIA) 10 MG tablet Take 1 tablet (10 mg total) by mouth daily. 90 tablet 2   KRILL OIL PO Take 1,200 mg by mouth daily.     levothyroxine (SYNTHROID) 50 MCG tablet Take 1 tablet (50 mcg total) by mouth daily before breakfast. 90 tablet 1   MAGNESIUM PO Take 400 mg by mouth daily.     omeprazole (PRILOSEC) 20 MG capsule TAKE 1 CAPSULE BY MOUTH EVERY DAY 90 capsule 1   Zinc Gluconate 30 MG TABS Take 30 mg by mouth daily.     furosemide (LASIX) 20 MG tablet Take 1 tablet (20 mg total) by mouth daily. 90 tablet 0    Facility-Administered Medications Prior to Visit  Medication Dose Route Frequency Provider Last Rate Last Admin   acetaminophen (TYLENOL) tablet 650 mg  650 mg Oral Once Josph Macho, MD       peginterferon alfa-2a (PEGASYS) injection 81 mcg  81 mcg Subcutaneous Weekly Josph Macho, MD   81 mcg at 04/26/14 1530    Allergies  Allergen Reactions   Lisinopril Cough   Losartan Cough   Lubiprostone Nausea And Vomiting   Pravachol [Pravastatin Sodium] Other (See Comments)    "Causes pain in my body"   Repatha [Evolocumab] Cough    Severe cough that would not respond to any treatment - per patient.    Review of Systems  Constitutional:  Negative for fever and malaise/fatigue.  HENT:  Negative for congestion.   Eyes:  Negative for blurred vision.  Respiratory:  Negative for shortness of breath.   Cardiovascular:  Negative for chest pain, palpitations and leg swelling.  Gastrointestinal:  Negative for abdominal pain, blood in stool and nausea.  Genitourinary:  Negative for dysuria and frequency.  Musculoskeletal:  Positive for falls (Fall on 07/25/2022).       (+)pain in left great toe (+)swelling in left foot and left ankle  Skin:  Negative for rash.       (+)bruising on left ankle  Neurological:  Negative for dizziness, loss of consciousness and headaches.  Endo/Heme/Allergies:  Negative for environmental allergies.  Psychiatric/Behavioral:  Positive for depression. The patient is nervous/anxious and has insomnia.        Objective:    Physical Exam Vitals and nursing note reviewed.  Constitutional:      General: She is not in acute distress.    Appearance: Normal appearance.  She is well-developed. She is not ill-appearing.  HENT:     Head: Normocephalic and atraumatic.     Right Ear: External ear normal.     Left Ear: External ear normal.     Nose: Nose normal.  Eyes:     Extraocular Movements: Extraocular movements intact.     Conjunctiva/sclera: Conjunctivae  normal.     Pupils: Pupils are equal, round, and reactive to light.  Neck:     Thyroid: No thyromegaly.     Vascular: No carotid bruit or JVD.  Cardiovascular:     Rate and Rhythm: Normal rate and regular rhythm.     Heart sounds: Normal heart sounds. No murmur heard.    No gallop.  Pulmonary:     Effort: Pulmonary effort is normal. No respiratory distress.     Breath sounds: Normal breath sounds. No wheezing or rales.  Chest:     Chest wall: No tenderness.  Abdominal:     General: Abdomen is flat.     Palpations: Abdomen is soft.  Musculoskeletal:        General: Normal range of motion.     Cervical back: Normal range of motion and neck supple.  Skin:    General: Skin is warm and dry.  Neurological:     General: No focal deficit present.     Mental Status: She is alert and oriented to person, place, and time.     Coordination: Coordination abnormal.  Psychiatric:        Attention and Perception: Attention and perception normal.        Mood and Affect: Mood is anxious and depressed. Affect is tearful.        Speech: Speech normal.        Behavior: Behavior is uncooperative.        Thought Content: Thought content is not paranoid or delusional. Thought content does not include homicidal or suicidal ideation. Thought content does not include homicidal or suicidal plan.        Cognition and Memory: Cognition and memory normal.        Judgment: Judgment normal.     BP 138/80 (BP Location: Left Arm, Patient Position: Sitting, Cuff Size: Large)   Pulse 68   Temp 97.8 F (36.6 C) (Oral)   Resp 18   Ht 5\' 6"  (1.676 m)   Wt 216 lb 6.4 oz (98.2 kg)   LMP  (LMP Unknown)   SpO2 99%   BMI 34.93 kg/m  Wt Readings from Last 3 Encounters:  08/03/22 216 lb 6.4 oz (98.2 kg)  05/20/22 221 lb (100.2 kg)  04/29/22 219 lb (99.3 kg)       Assessment & Plan:  Pain of toe of left foot Assessment & Plan: Check xray  Toe swollen and + ecchymosis   Orders: -     DG Foot Complete  Left; Future  Lower extremity edema -     Furosemide; Take 1 tablet (20 mg total) by mouth 2 (two) times daily.  Dispense: 180 tablet; Refill: 0  Insect bite of left lower leg, initial encounter Assessment & Plan: Triamcinolone to area  Keflex 500 bid x 10 days   Orders: -     Cephalexin; Take 1 capsule (500 mg total) by mouth 2 (two) times daily.  Dispense: 20 capsule; Refill: 0 -     Triamcinolone Acetonide; Apply 1 Application topically 2 (two) times daily.  Dispense: 30 g; Refill: 0  Depression with anxiety Assessment & Plan:  Lexapro 10 mg daily  Hydroxyzine as needed  F/u 1 month or sooner as needed   Orders: -     Escitalopram Oxalate; Take 1 tablet (10 mg total) by mouth at bedtime.  Dispense: 30 tablet; Refill: 3 -     hydrOXYzine Pamoate; Take 1 capsule (25 mg total) by mouth every 8 (eight) hours as needed for anxiety.  Dispense: 30 capsule; Refill: 0  Hyperparathyroidism (HCC) -     Parathyroid hormone, intact (no Ca) -     VITAMIN D 25 Hydroxy (Vit-D Deficiency, Fractures)  Myeloproliferative disorder (HCC)  Hypothyroidism, unspecified type -     CBC with Differential/Platelet -     TSH  Primary hypertension -     CBC with Differential/Platelet  Hyperlipidemia, unspecified hyperlipidemia type -     CBC with Differential/Platelet -     Comprehensive metabolic panel -     Lipid panel  Edema, lower extremity Assessment & Plan: Can increase lasix to 2 a day  Elevated ext and compression socks    Essential hypertension Assessment & Plan: Well controlled, no changes to meds. Encouraged heart healthy diet such as the DASH diet and exercise as tolerated.       IDonato Schultz, DO, personally preformed the services described in this documentation.  All medical record entries made by the scribe were at my direction and in my presence.  I have reviewed the chart and discharge instructions (if applicable) and agree that the record reflects my personal  performance and is accurate and complete. 08/03/2022   I,Shehryar Baig,acting as a scribe for Donato Schultz, DO.,have documented all relevant documentation on the behalf of Donato Schultz, DO,as directed by  Donato Schultz, DO while in the presence of Donato Schultz, DO.   Donato Schultz, DO

## 2022-08-03 NOTE — Assessment & Plan Note (Signed)
Lexapro 10 mg daily  Hydroxyzine as needed  F/u 1 month or sooner as needed

## 2022-08-03 NOTE — Patient Instructions (Signed)

## 2022-08-03 NOTE — Assessment & Plan Note (Signed)
Can increase lasix to 2 a day  Elevated ext and compression socks

## 2022-08-03 NOTE — Assessment & Plan Note (Signed)
Well controlled, no changes to meds. Encouraged heart healthy diet such as the DASH diet and exercise as tolerated.  °

## 2022-08-03 NOTE — Assessment & Plan Note (Signed)
Check xray  Toe swollen and + ecchymosis

## 2022-08-04 LAB — PARATHYROID HORMONE, INTACT (NO CA): PTH: 165 pg/mL — ABNORMAL HIGH (ref 16–77)

## 2022-08-05 ENCOUNTER — Other Ambulatory Visit: Payer: Self-pay | Admitting: Family Medicine

## 2022-08-05 ENCOUNTER — Other Ambulatory Visit: Payer: Self-pay | Admitting: Cardiology

## 2022-08-05 DIAGNOSIS — K219 Gastro-esophageal reflux disease without esophagitis: Secondary | ICD-10-CM

## 2022-08-05 NOTE — Telephone Encounter (Signed)
Ezetimibe refills to pharmacy

## 2022-08-06 ENCOUNTER — Telehealth: Payer: Self-pay | Admitting: Family Medicine

## 2022-08-06 NOTE — Telephone Encounter (Signed)
Pt called to find out what her imaging results were since she hasn't heard anything. Advised pt that it can take up to 72 hours to be read. Pt understands but was curious since it has been almost 3 days since she had the imaging done. Please follow up with patient

## 2022-08-09 NOTE — Telephone Encounter (Signed)
Pt viewed via Mychart 

## 2022-08-25 ENCOUNTER — Other Ambulatory Visit: Payer: Self-pay | Admitting: Family Medicine

## 2022-08-25 DIAGNOSIS — F418 Other specified anxiety disorders: Secondary | ICD-10-CM

## 2022-08-26 DIAGNOSIS — E559 Vitamin D deficiency, unspecified: Secondary | ICD-10-CM | POA: Diagnosis not present

## 2022-08-26 DIAGNOSIS — E042 Nontoxic multinodular goiter: Secondary | ICD-10-CM | POA: Diagnosis not present

## 2022-08-26 DIAGNOSIS — E213 Hyperparathyroidism, unspecified: Secondary | ICD-10-CM | POA: Diagnosis not present

## 2022-09-02 DIAGNOSIS — G720 Drug-induced myopathy: Secondary | ICD-10-CM

## 2022-09-02 NOTE — Progress Notes (Signed)
[facility]) [facility] Quality Pharmacy Team Statin Quality Measure Assessment  09/02/2022  KRISANDRA BUENO 08-Nov-1970 [id number]   Per review of chart and payor information, patient has a diagnosis of ASCVD. This places patient into the Statin use for Patients with Cardiovascular Disease [facility]) measure for CMS. The patient is not currently filling a statin prescription as a result of previous statin intolerance due to myopathy. There is Ezetimibe (Zetia) and Bempedoic acid on file.  Last year, dx code statin myopathy (G72.0 or G72.9) was added to the patient's encounter at [facility] 02/01/22. Next appointment is on 09/06/22. If deemed therapeutically appropriate, please consider associating exclusion code (see options below) to remove the patient from this year's measure.  Drug Induced Myopathy G72.0 Myopathy, unspecified G72.9 Myositis, unspecified M60.9 Rhabdomyolysis M62.82 Cirrhosis of liver K74.69 Prediabetes R73.03 PCOS E28.2  Thank you for allowing [facility] pharmacy to be a part of this patient's care.   Harlon Flor, PharmD Clinical Pharmacist  [facility] [phone]

## 2022-09-06 ENCOUNTER — Ambulatory Visit: Payer: 59 | Admitting: Family Medicine

## 2022-09-07 DIAGNOSIS — E041 Nontoxic single thyroid nodule: Secondary | ICD-10-CM | POA: Diagnosis not present

## 2022-10-04 ENCOUNTER — Other Ambulatory Visit: Payer: Self-pay | Admitting: Family Medicine

## 2022-10-04 DIAGNOSIS — F418 Other specified anxiety disorders: Secondary | ICD-10-CM

## 2022-10-28 ENCOUNTER — Other Ambulatory Visit: Payer: Self-pay | Admitting: Family Medicine

## 2022-10-28 DIAGNOSIS — R600 Localized edema: Secondary | ICD-10-CM

## 2022-11-04 ENCOUNTER — Encounter (INDEPENDENT_AMBULATORY_CARE_PROVIDER_SITE_OTHER): Payer: Self-pay

## 2022-11-20 ENCOUNTER — Other Ambulatory Visit: Payer: Self-pay | Admitting: Family Medicine

## 2022-11-20 DIAGNOSIS — K219 Gastro-esophageal reflux disease without esophagitis: Secondary | ICD-10-CM

## 2022-11-30 ENCOUNTER — Encounter: Payer: Self-pay | Admitting: Family Medicine

## 2022-11-30 ENCOUNTER — Ambulatory Visit (INDEPENDENT_AMBULATORY_CARE_PROVIDER_SITE_OTHER): Payer: 59 | Admitting: Family Medicine

## 2022-11-30 VITALS — BP 108/70 | HR 51 | Temp 97.6°F | Resp 18 | Ht 66.0 in | Wt 216.8 lb

## 2022-11-30 DIAGNOSIS — Z0000 Encounter for general adult medical examination without abnormal findings: Secondary | ICD-10-CM | POA: Diagnosis not present

## 2022-11-30 DIAGNOSIS — E538 Deficiency of other specified B group vitamins: Secondary | ICD-10-CM | POA: Diagnosis not present

## 2022-11-30 DIAGNOSIS — E039 Hypothyroidism, unspecified: Secondary | ICD-10-CM | POA: Diagnosis not present

## 2022-11-30 DIAGNOSIS — D471 Chronic myeloproliferative disease: Secondary | ICD-10-CM | POA: Diagnosis not present

## 2022-11-30 DIAGNOSIS — Z1231 Encounter for screening mammogram for malignant neoplasm of breast: Secondary | ICD-10-CM | POA: Diagnosis not present

## 2022-11-30 DIAGNOSIS — E559 Vitamin D deficiency, unspecified: Secondary | ICD-10-CM | POA: Diagnosis not present

## 2022-11-30 DIAGNOSIS — G720 Drug-induced myopathy: Secondary | ICD-10-CM

## 2022-11-30 DIAGNOSIS — I10 Essential (primary) hypertension: Secondary | ICD-10-CM | POA: Diagnosis not present

## 2022-11-30 DIAGNOSIS — F418 Other specified anxiety disorders: Secondary | ICD-10-CM

## 2022-11-30 DIAGNOSIS — E785 Hyperlipidemia, unspecified: Secondary | ICD-10-CM | POA: Diagnosis not present

## 2022-11-30 LAB — COMPREHENSIVE METABOLIC PANEL
ALT: 11 U/L (ref 0–35)
AST: 17 U/L (ref 0–37)
Albumin: 4.6 g/dL (ref 3.5–5.2)
Alkaline Phosphatase: 99 U/L (ref 39–117)
BUN: 13 mg/dL (ref 6–23)
CO2: 28 meq/L (ref 19–32)
Calcium: 11 mg/dL — ABNORMAL HIGH (ref 8.4–10.5)
Chloride: 101 meq/L (ref 96–112)
Creatinine, Ser: 0.57 mg/dL (ref 0.40–1.20)
GFR: 102.84 mL/min (ref >60.00)
Glucose, Bld: 86 mg/dL (ref 70–99)
Potassium: 4.2 meq/L (ref 3.5–5.1)
Sodium: 138 meq/L (ref 135–145)
Total Bilirubin: 0.7 mg/dL (ref 0.2–1.2)
Total Protein: 7.6 g/dL (ref 6.0–8.3)

## 2022-11-30 LAB — CBC WITH DIFFERENTIAL/PLATELET
Basophils Absolute: 0.1 10*3/uL (ref 0.0–0.1)
Basophils Relative: 0.4 % (ref 0.0–3.0)
Eosinophils Absolute: 0.1 10*3/uL (ref 0.0–0.7)
Eosinophils Relative: 0.9 % (ref 0.0–5.0)
HCT: 41.9 % (ref 36.0–46.0)
Hemoglobin: 13.3 g/dL (ref 12.0–15.0)
Lymphocytes Relative: 14.8 % (ref 12.0–46.0)
Lymphs Abs: 2.1 10*3/uL (ref 0.7–4.0)
MCHC: 31.9 g/dL (ref 30.0–36.0)
MCV: 92.9 fl (ref 78.0–100.0)
Monocytes Absolute: 0.7 10*3/uL (ref 0.1–1.0)
Monocytes Relative: 5.2 % (ref 3.0–12.0)
Neutro Abs: 11.2 10*3/uL — ABNORMAL HIGH (ref 1.4–7.7)
Neutrophils Relative %: 78.7 % — ABNORMAL HIGH (ref 43.0–77.0)
Platelets: 315 10*3/uL (ref 150.0–400.0)
RBC: 4.51 Mil/uL (ref 3.87–5.11)
RDW: 12.8 % (ref 11.5–15.5)
WBC: 14.2 10*3/uL — ABNORMAL HIGH (ref 4.0–10.5)

## 2022-11-30 LAB — LIPID PANEL
Cholesterol: 150 mg/dL (ref 0–200)
HDL: 48 mg/dL (ref >39.00)
LDL Cholesterol: 65 mg/dL (ref 0–99)
NonHDL: 101.86
Total CHOL/HDL Ratio: 3
Triglycerides: 182 mg/dL — ABNORMAL HIGH (ref 0.0–149.0)
VLDL: 36.4 mg/dL (ref 0.0–40.0)

## 2022-11-30 LAB — VITAMIN B12: Vitamin B-12: 1112 pg/mL — ABNORMAL HIGH (ref 211–911)

## 2022-11-30 LAB — VITAMIN D 25 HYDROXY (VIT D DEFICIENCY, FRACTURES): VITD: 30.25 ng/mL (ref 30.00–100.00)

## 2022-11-30 LAB — TSH: TSH: 0.75 u[IU]/mL (ref 0.35–5.50)

## 2022-11-30 NOTE — Assessment & Plan Note (Signed)
Well controlled, no changes to meds. Encouraged heart healthy diet such as the DASH diet and exercise as tolerated.  °

## 2022-11-30 NOTE — Assessment & Plan Note (Signed)
Ghm utd Check labs  See AVS Health Maintenance  Topic Date Due   Zoster Vaccines- Shingrix (1 of 2) Never done   Colonoscopy  02/11/2020   MAMMOGRAM  03/17/2022   COVID-19 Vaccine (4 - 2023-24 season) 11/21/2022   INFLUENZA VACCINE  06/20/2023 (Originally 10/21/2022)   Medicare Annual Wellness (AWV)  03/18/2023   DTaP/Tdap/Td (2 - Td or Tdap) 06/30/2024   Hepatitis C Screening  Completed   HIV Screening  Completed   HPV VACCINES  Aged Out   PAP SMEAR-Modifier  Discontinued

## 2022-11-30 NOTE — Progress Notes (Signed)
Established Patient Office Visit  Subjective   Patient ID: Brianna Fletcher, female    DOB: Apr 10, 1970  Age: 52 y.o. MRN: [id number]  Chief Complaint  Patient presents with   Annual Exam    Pt states fasting     HPI Discussed the use of AI scribe software for clinical note transcription with the patient, who gave verbal consent to proceed.  History of Present Illness   The patient, with a history of depression and thyroid issues, presents for cpe  and  with significant emotional distress due to a series of personal tragedies. She recently lost her boyfriend unexpectedly, followed by the death of her mother and cousin. She has been caring for her father who recently had a failed spinal operation and is now wheelchair-bound. This has resulted in her moving between houses and dealing with the stress of caring for her father. She reports that she has been on Lexapro 10mg  for depression and it has helped her cope with her circumstances. She also mentions a thyroid issue and is due to see a specialist for this. The patient expresses a desire for some alone time to process her grief and stresses the need for stability in her living situation.      Patient Active Problem List   Diagnosis Date Noted   Pain of toe of left foot 08/03/2022   Insect bite of left lower leg 08/03/2022   Drug-induced myopathy 02/01/2022   Nonintractable headache 03/05/2021   Embolism and thrombosis of arteries of the upper extremities (HCC) 02/17/2021   Preventative health care 08/04/2020   Colon cancer screening 08/04/2020   Need for hepatitis C screening test 08/04/2020   Vaginal delivery    Thyroid disease    Precordial chest pain    Joint pain    IBS (irritable bowel syndrome)    Hypertension    High cholesterol    Glaucoma    GERD (gastroesophageal reflux disease)    Fibromyalgia    Family history of heart disease    Edema, lower extremity    Depression with anxiety    Blood dyscrasia    Back pain     Arthritis    Viral upper respiratory tract infection 04/30/2020   Post-COVID chronic cough 04/30/2020   Lower extremity edema 01/31/2020   E. coli infection 12/08/2017   Hyperlipidemia 12/09/2016   Hypothyroidism 09/23/2016   Arthralgia 09/07/2016   Bruising 05/09/2016   Change in stool habits 05/11/2015   Rectal bleeding 05/11/2015   S/P hysterectomy 03/05/2015   LUQ pain 01/14/2015   Constipation 01/14/2015   Severe obesity (BMI >= 40) (HCC) 12/23/2014   Morbid obesity (HCC) 12/23/2014   Radial artery occlusion, right (HCC) 12/20/2014   HTN (hypertension) 11/04/2014   Right arm pain 11/04/2014   Essential hypertension 11/04/2014   Cough 11/04/2014   Ischemic chest pain (HCC)    Vitamin D deficiency 08/26/2014   Cramp in lower leg 08/26/2014   Right thyroid nodule 07/22/2014   Hyperparathyroidism (HCC) 07/22/2014   Myeloproliferative disorder (HCC) 03/08/2013   Leukocytosis 11/15/2012   Chronic neutrophilia 11/15/2012   Pruritus 11/15/2012   Monocytosis 11/15/2012   Past Medical History:  Diagnosis Date   Arthritis    Back pain    Blood dyscrasia    produces too many WBCs   Chronic neutrophilia 11/15/2012   Depression    Edema, lower extremity    Family history of heart disease    mother had stents placed in her 62s and  had myocardial infarctions   Fibromyalgia    GERD (gastroesophageal reflux disease)    Glaucoma    Glaucoma    High cholesterol    Hypertension    IBS (irritable bowel syndrome)    Joint pain    Leukocytosis, unspecified 11/15/2012   Monocytosis 11/15/2012   Myeloproliferative disorder (HCC) 03/08/2013   Precordial chest pain    Pruritus 11/15/2012   Radial artery occlusion, right (HCC)    status post  right radial artery cardiac catheterization   Thyroid disease    Vaginal delivery 1986, 2000   Vitamin D deficiency    Past Surgical History:  Procedure Laterality Date   CARDIAC CATHETERIZATION N/A 10/03/2014   Procedure: Left Heart Cath and  Coronary Angiography;  Surgeon: Runell Gess, MD;  Location: [facility] INVASIVE CV LAB;  Service: Cardiovascular;  Laterality: N/A;   DILITATION & CURRETTAGE/HYSTROSCOPY WITH NOVASURE ABLATION N/A 11/08/2014   Procedure: DILATATION & CURETTAGE/HYSTEROSCOPY WITH NOVASURE ABLATION;  Surgeon: Mitchel Honour, DO;  Location: [facility];  Service: Gynecology;  Laterality: N/A;   EYE SURGERY     laser eye surgery   LAPAROSCOPIC VAGINAL HYSTERECTOMY WITH SALPINGECTOMY Bilateral 03/05/2015   Procedure: LAPAROSCOPIC ASSISTED VAGINAL HYSTERECTOMY WITH SALPINGECTOMY;  Surgeon: Mitchel Honour, DO;  Location: [facility];  Service: Gynecology;  Laterality: Bilateral;   TUBAL LIGATION  2000   Social History   Tobacco Use   Smoking status: Never   Smokeless tobacco: Never   Tobacco comments:    08/03/2021 Never smoked.  Vaping Use   Vaping status: Never Used  Substance Use Topics   Alcohol use: Not Currently    Alcohol/week: 6.0 standard drinks of alcohol    Types: 6 Cans of beer per week   Drug use: No   Social History   Socioeconomic History   Marital status: Divorced    Spouse name: Not on file   Number of children: Not on file   Years of education: Not on file   Highest education level: Not on file  Occupational History   Occupation: unemployed  Tobacco Use   Smoking status: Never   Smokeless tobacco: Never   Tobacco comments:    08/03/2021 Never smoked.  Vaping Use   Vaping status: Never Used  Substance and Sexual Activity   Alcohol use: Not Currently    Alcohol/week: 6.0 standard drinks of alcohol    Types: 6 Cans of beer per week   Drug use: No   Sexual activity: Not Currently    Partners: Male  Other Topics Concern   Not on file  Social History Narrative   Very little exercise   Social Determinants of Health   Financial Resource Strain: Low Risk  (12/10/2020)   Overall Financial Resource Strain (CARDIA)    Difficulty of Paying Living Expenses: Not hard at all  Food Insecurity: Food  Insecurity Present (03/17/2022)   Hunger Vital Sign    Worried About Running Out of Food in the Last Year: Sometimes true    Ran Out of Food in the Last Year: Never true  Transportation Needs: No Transportation Needs (03/17/2022)   PRAPARE - Administrator, Civil Service (Medical): No    Lack of Transportation (Non-Medical): No  Physical Activity: Inactive (12/10/2020)   Exercise Vital Sign    Days of Exercise per Week: 0 days    Minutes of Exercise per Session: 0 min  Stress: No Stress Concern Present (12/10/2020)   [employer] of Occupational Health - Occupational Stress Questionnaire  Feeling of Stress : Only a little  Social Connections: Moderately Isolated (12/10/2020)   Social Connection and Isolation Panel [NHANES]    Frequency of Communication with Friends and Family: More than three times a week    Frequency of Social Gatherings with Friends and Family: More than three times a week    Attends Religious Services: Never    Database administrator or Organizations: No    Attends Banker Meetings: Never    Marital Status: Living with partner  Intimate Partner Violence: Not At Risk (03/17/2022)   Humiliation, Afraid, Rape, and Kick questionnaire    Fear of Current or Ex-Partner: No    Emotionally Abused: No    Physically Abused: No    Sexually Abused: No   Family Status  Relation Name Status   Mother  Deceased at age 51   Father  Deceased   MGM  Deceased       3   MGF  Deceased   Mat Aunt  Alive   Mat Uncle  Deceased at age 63       cva, GI bleed   Cousin  Deceased at age 23       MI   Neg Hx  (Not Specified)  No partnership data on file   Family History  Problem Relation Age of Onset   Heart disease Mother    Hypertension Mother    Hyperlipidemia Mother    Depression Mother    Sleep apnea Mother    Obesity Mother    Diabetes Father    Cancer Father        esopageal ?   Alcoholism Father    Dementia Maternal Grandmother     Heart disease Maternal Grandmother    Heart disease Maternal Grandfather 50       MI   Heart disease Cousin    Other Neg Hx        hyperparathyroidism   Allergies  Allergen Reactions   Lisinopril Cough   Losartan Cough   Lubiprostone Nausea And Vomiting   Pravachol [Pravastatin Sodium] Other (See Comments)    "Causes pain in my body"   Repatha [Evolocumab] Cough    Severe cough that would not respond to any treatment - per patient.      Review of Systems  Constitutional:  Negative for chills, fever and malaise/fatigue.  HENT:  Negative for congestion and hearing loss.   Eyes:  Negative for blurred vision and discharge.  Respiratory:  Negative for cough, sputum production and shortness of breath.   Cardiovascular:  Negative for chest pain, palpitations and leg swelling.  Gastrointestinal:  Negative for abdominal pain, blood in stool, constipation, diarrhea, heartburn, nausea and vomiting.  Genitourinary:  Negative for dysuria, frequency, hematuria and urgency.  Musculoskeletal:  Negative for back pain, falls and myalgias.  Skin:  Negative for rash.  Neurological:  Negative for dizziness, sensory change, loss of consciousness, weakness and headaches.  Endo/Heme/Allergies:  Negative for environmental allergies. Does not bruise/bleed easily.  Psychiatric/Behavioral:  Negative for depression and suicidal ideas. The patient is not nervous/anxious and does not have insomnia.       Objective:     BP 108/70 (BP Location: Left Arm, Patient Position: Sitting, Cuff Size: Large)   Pulse (!) 51   Temp 97.6 F (36.4 C) (Oral)   Resp 18   Ht 5\' 6"  (1.676 m)   Wt 216 lb 12.8 oz (98.3 kg)   LMP  (LMP Unknown)   SpO2  98%   BMI 34.99 kg/m  BP Readings from Last 3 Encounters:  11/30/22 108/70  08/03/22 138/80  05/20/22 124/63   Wt Readings from Last 3 Encounters:  11/30/22 216 lb 12.8 oz (98.3 kg)  08/03/22 216 lb 6.4 oz (98.2 kg)  05/20/22 221 lb (100.2 kg)   SpO2 Readings from  Last 3 Encounters:  11/30/22 98%  08/03/22 99%  05/20/22 100%      Physical Exam Vitals and nursing note reviewed.  Constitutional:      General: She is not in acute distress.    Appearance: Normal appearance. She is well-developed.  HENT:     Head: Normocephalic and atraumatic.     Right Ear: Tympanic membrane, ear canal and external ear normal. There is no impacted cerumen.     Left Ear: Tympanic membrane, ear canal and external ear normal. There is no impacted cerumen.     Nose: Nose normal.     Mouth/Throat:     Mouth: Mucous membranes are moist.     Pharynx: Oropharynx is clear. No oropharyngeal exudate or posterior oropharyngeal erythema.  Eyes:     General: No scleral icterus.       Right eye: No discharge.        Left eye: No discharge.     Conjunctiva/sclera: Conjunctivae normal.     Pupils: Pupils are equal, round, and reactive to light.  Neck:     Thyroid: No thyromegaly or thyroid tenderness.     Vascular: No JVD.  Cardiovascular:     Rate and Rhythm: Normal rate and regular rhythm.     Heart sounds: Normal heart sounds. No murmur heard. Pulmonary:     Effort: Pulmonary effort is normal. No respiratory distress.     Breath sounds: Normal breath sounds.  Abdominal:     General: Bowel sounds are normal. There is no distension.     Palpations: Abdomen is soft. There is no mass.     Tenderness: There is no abdominal tenderness. There is no guarding or rebound.  Genitourinary:    Vagina: Normal.  Musculoskeletal:        General: Normal range of motion.     Cervical back: Normal range of motion and neck supple.     Right lower leg: No edema.     Left lower leg: No edema.  Lymphadenopathy:     Cervical: No cervical adenopathy.  Skin:    General: Skin is warm and dry.     Findings: No erythema or rash.  Neurological:     Mental Status: She is alert and oriented to person, place, and time.     Cranial Nerves: No cranial nerve deficit.     Deep Tendon Reflexes:  Reflexes are normal and symmetric.  Psychiatric:        Mood and Affect: Mood normal.        Behavior: Behavior normal.        Thought Content: Thought content normal.        Judgment: Judgment normal.      No results found for any visits on 11/30/22.  Last CBC Lab Results  Component Value Date   WBC 16.1 (H) 08/03/2022   HGB 13.4 08/03/2022   HCT 40.7 08/03/2022   MCV 92.5 08/03/2022   MCH 30.0 05/20/2022   RDW 13.2 08/03/2022   PLT 341.0 08/03/2022   Last metabolic panel Lab Results  Component Value Date   GLUCOSE 85 08/03/2022   NA 139 08/03/2022  K 4.3 08/03/2022   CL 103 08/03/2022   CO2 27 08/03/2022   BUN 11 08/03/2022   CREATININE 0.54 08/03/2022   GFR 104.43 08/03/2022   CALCIUM 11.1 (H) 08/03/2022   PROT 7.8 08/03/2022   ALBUMIN 4.8 08/03/2022   LABGLOB 2.9 10/08/2021   AGRATIO 1.7 10/08/2021   BILITOT 0.6 08/03/2022   ALKPHOS 110 08/03/2022   AST 16 08/03/2022   ALT 9 08/03/2022   ANIONGAP 8 05/20/2022   Last lipids Lab Results  Component Value Date   CHOL 148 08/03/2022   HDL 55.90 08/03/2022   LDLCALC 75 08/03/2022   LDLDIRECT 121.0 02/02/2022   TRIG 85.0 08/03/2022   CHOLHDL 3 08/03/2022   Last hemoglobin A1c No results found for: "HGBA1C" Last thyroid functions Lab Results  Component Value Date   TSH 0.41 08/03/2022   T4TOTAL 12.4 (H) 12/09/2016   Last vitamin D Lab Results  Component Value Date   VD25OH 26.67 (L) 08/03/2022   Last vitamin B12 and Folate Lab Results  Component Value Date   VITAMINB12 964 (H) 02/02/2022   FOLATE  06/01/2010    15.8 (NOTE)  Reference Ranges        Deficient:       0.4 - 3.3 ng/mL        Indeterminate:   3.4 - 5.4 ng/mL        Normal:              > 5.4 ng/mL      The 10-year ASCVD risk score (Arnett DK, et al., 2019) is: 1.2%    Assessment & Plan:   Problem List Items Addressed This Visit       Unprioritized   HTN (hypertension)   Relevant Orders   CBC with Differential/Platelet    Comprehensive metabolic panel   Vitamin D deficiency   Relevant Orders   VITAMIN D 25 Hydroxy (Vit-D Deficiency, Fractures)   Depression with anxiety   Preventative health care    Ghm utd Check labs  See AVS Health Maintenance  Topic Date Due   Zoster Vaccines- Shingrix (1 of 2) Never done   Colonoscopy  02/11/2020   MAMMOGRAM  03/17/2022   COVID-19 Vaccine (4 - 2023-24 season) 11/21/2022   INFLUENZA VACCINE  06/20/2023 (Originally 10/21/2022)   Medicare Annual Wellness (AWV)  03/18/2023   DTaP/Tdap/Td (2 - Td or Tdap) 06/30/2024   Hepatitis C Screening  Completed   HIV Screening  Completed   HPV VACCINES  Aged Out   PAP SMEAR-Modifier  Discontinued         Myeloproliferative disorder (HCC) (Chronic)    Per hematology      Morbid obesity (HCC)    Con't diet and exercise      Hypothyroidism    Check labs       Relevant Orders   TSH   Hyperlipidemia    Encourage heart healthy diet such as MIND or DASH diet, increase exercise, avoid trans fats, simple carbohydrates and processed foods, consider a krill or fish or flaxseed oil cap daily.        Relevant Orders   Lipid panel   Essential hypertension    Well controlled, no changes to meds. Encouraged heart healthy diet such as the DASH diet and exercise as tolerated.        Other Visit Diagnoses     Screening mammogram for breast cancer    -  Primary   Relevant Orders   MM Digital Screening  Vitamin B12 deficiency       Relevant Orders   Vitamin B12     Assessment and Plan    Depression On Lexapro 10mg  with good response. Recent significant life stressors including multiple family deaths and caregiving responsibilities. -Continue Lexapro 10mg  daily.  Preventive Health Delayed mammogram and colonoscopy due to personal circumstances. -Schedule mammogram today in radiology. -Encourage patient to schedule colonoscopy when feasible.  Shingles Vaccination Patient hesitant due to current health  concerns. -Advise patient to walk in for vaccination when ready, available between 9am-3pm.  Hypercalcemia Under management by endocrinologist, possibly related to low Vitamin D levels. -Check Vitamin D and calcium levels today.  Follow-up -Return visit with endocrinologist on October 25th. -Check thyroid ultrasound results from endocrinologist's office.        Return in about 6 months (around 05/30/2023), or if symptoms worsen or fail to improve, for hypertension, hyperlipidemia.    Donato Schultz, DO

## 2022-11-30 NOTE — Patient Instructions (Signed)
Preventive Care 40-54 Years Old, Female Preventive care refers to lifestyle choices and visits with your health care provider that can promote health and wellness. Preventive care visits are also called wellness exams. What can I expect for my preventive care visit? Counseling Your health care provider may ask you questions about your: Medical history, including: Past medical problems. Family medical history. Pregnancy history. Current health, including: Menstrual cycle. Method of birth control. Emotional well-being. Home life and relationship well-being. Sexual activity and sexual health. Lifestyle, including: Alcohol, nicotine or tobacco, and drug use. Access to firearms. Diet, exercise, and sleep habits. Work and work environment. Sunscreen use. Safety issues such as seatbelt and bike helmet use. Physical exam Your health care provider will check your: Height and weight. These may be used to calculate your BMI (body mass index). BMI is a measurement that tells if you are at a healthy weight. Waist circumference. This measures the distance around your waistline. This measurement also tells if you are at a healthy weight and may help predict your risk of certain diseases, such as type 2 diabetes and high blood pressure. Heart rate and blood pressure. Body temperature. Skin for abnormal spots. What immunizations do I need?  Vaccines are usually given at various ages, according to a schedule. Your health care provider will recommend vaccines for you based on your age, medical history, and lifestyle or other factors, such as travel or where you work. What tests do I need? Screening Your health care provider may recommend screening tests for certain conditions. This may include: Lipid and cholesterol levels. Diabetes screening. This is done by checking your blood sugar (glucose) after you have not eaten for a while (fasting). Pelvic exam and Pap test. Hepatitis B test. Hepatitis C  test. HIV (human immunodeficiency virus) test. STI (sexually transmitted infection) testing, if you are at risk. Lung cancer screening. Colorectal cancer screening. Mammogram. Talk with your health care provider about when you should start having regular mammograms. This may depend on whether you have a family history of breast cancer. BRCA-related cancer screening. This may be done if you have a family history of breast, ovarian, tubal, or peritoneal cancers. Bone density scan. This is done to screen for osteoporosis. Talk with your health care provider about your test results, treatment options, and if necessary, the need for more tests. Follow these instructions at home: Eating and drinking  Eat a diet that includes fresh fruits and vegetables, whole grains, lean protein, and low-fat dairy products. Take vitamin and mineral supplements as recommended by your health care provider. Do not drink alcohol if: Your health care provider tells you not to drink. You are pregnant, may be pregnant, or are planning to become pregnant. If you drink alcohol: Limit how much you have to 0-1 drink a day. Know how much alcohol is in your drink. In the [place] one drink equals one 12 oz bottle of beer (355 mL), one 5 oz glass of wine (148 mL), or one 1 oz glass of hard liquor (44 mL). Lifestyle Brush your teeth every morning and night with fluoride toothpaste. Floss one time each day. Exercise for at least 30 minutes 5 or more days each week. Do not use any products that contain nicotine or tobacco. These products include cigarettes, chewing tobacco, and vaping devices, such as e-cigarettes. If you need help quitting, ask your health care provider. Do not use drugs. If you are sexually active, practice safe sex. Use a condom or other form of protection to   prevent STIs. If you do not wish to become pregnant, use a form of birth control. If you plan to become pregnant, see your health care provider for a  prepregnancy visit. Take aspirin only as told by your health care provider. Make sure that you understand how much to take and what form to take. Work with your health care provider to find out whether it is safe and beneficial for you to take aspirin daily. Find healthy ways to manage stress, such as: Meditation, yoga, or listening to music. Journaling. Talking to a trusted person. Spending time with friends and family. Minimize exposure to UV radiation to reduce your risk of skin cancer. Safety Always wear your seat belt while driving or riding in a vehicle. Do not drive: If you have been drinking alcohol. Do not ride with someone who has been drinking. When you are tired or distracted. While texting. If you have been using any mind-altering substances or drugs. Wear a helmet and other protective equipment during sports activities. If you have firearms in your house, make sure you follow all gun safety procedures. Seek help if you have been physically or sexually abused. What's next? Visit your health care provider once a year for an annual wellness visit. Ask your health care provider how often you should have your eyes and teeth checked. Stay up to date on all vaccines. This information is not intended to replace advice given to you by your health care provider. Make sure you discuss any questions you have with your health care provider. Document Revised: 09/03/2020 Document Reviewed: 09/03/2020 Elsevier Patient Education  2024 Elsevier Inc.  

## 2022-11-30 NOTE — Assessment & Plan Note (Signed)
Encourage heart healthy diet such as MIND or DASH diet, increase exercise, avoid trans fats, simple carbohydrates and processed foods, consider a krill or fish or flaxseed oil cap daily.  °

## 2022-11-30 NOTE — Assessment & Plan Note (Signed)
Con't diet and exercise  

## 2022-11-30 NOTE — Assessment & Plan Note (Signed)
Check labs 

## 2022-11-30 NOTE — Assessment & Plan Note (Signed)
Per hematology 

## 2022-12-07 ENCOUNTER — Other Ambulatory Visit: Payer: Self-pay | Admitting: Family Medicine

## 2022-12-07 ENCOUNTER — Telehealth (HOSPITAL_BASED_OUTPATIENT_CLINIC_OR_DEPARTMENT_OTHER): Payer: Self-pay

## 2022-12-07 DIAGNOSIS — R600 Localized edema: Secondary | ICD-10-CM

## 2022-12-13 DIAGNOSIS — L578 Other skin changes due to chronic exposure to nonionizing radiation: Secondary | ICD-10-CM | POA: Diagnosis not present

## 2022-12-13 DIAGNOSIS — L820 Inflamed seborrheic keratosis: Secondary | ICD-10-CM | POA: Diagnosis not present

## 2022-12-13 DIAGNOSIS — D225 Melanocytic nevi of trunk: Secondary | ICD-10-CM | POA: Diagnosis not present

## 2022-12-13 DIAGNOSIS — D485 Neoplasm of uncertain behavior of skin: Secondary | ICD-10-CM | POA: Diagnosis not present

## 2022-12-13 DIAGNOSIS — D2239 Melanocytic nevi of other parts of face: Secondary | ICD-10-CM | POA: Diagnosis not present

## 2022-12-13 DIAGNOSIS — L821 Other seborrheic keratosis: Secondary | ICD-10-CM | POA: Diagnosis not present

## 2022-12-31 ENCOUNTER — Other Ambulatory Visit: Payer: Self-pay | Admitting: Family Medicine

## 2022-12-31 DIAGNOSIS — I10 Essential (primary) hypertension: Secondary | ICD-10-CM

## 2023-01-04 DIAGNOSIS — Z974 Presence of external hearing-aid: Secondary | ICD-10-CM | POA: Diagnosis not present

## 2023-01-04 DIAGNOSIS — H9193 Unspecified hearing loss, bilateral: Secondary | ICD-10-CM | POA: Diagnosis not present

## 2023-01-14 DIAGNOSIS — E213 Hyperparathyroidism, unspecified: Secondary | ICD-10-CM | POA: Diagnosis not present

## 2023-01-14 DIAGNOSIS — E041 Nontoxic single thyroid nodule: Secondary | ICD-10-CM | POA: Diagnosis not present

## 2023-01-14 DIAGNOSIS — E559 Vitamin D deficiency, unspecified: Secondary | ICD-10-CM | POA: Diagnosis not present

## 2023-01-21 ENCOUNTER — Emergency Department (HOSPITAL_BASED_OUTPATIENT_CLINIC_OR_DEPARTMENT_OTHER): Payer: 59

## 2023-01-21 ENCOUNTER — Other Ambulatory Visit: Payer: Self-pay

## 2023-01-21 ENCOUNTER — Telehealth: Payer: Self-pay | Admitting: Cardiology

## 2023-01-21 ENCOUNTER — Encounter (HOSPITAL_BASED_OUTPATIENT_CLINIC_OR_DEPARTMENT_OTHER): Payer: Self-pay | Admitting: Urology

## 2023-01-21 ENCOUNTER — Emergency Department (HOSPITAL_BASED_OUTPATIENT_CLINIC_OR_DEPARTMENT_OTHER)
Admission: EM | Admit: 2023-01-21 | Discharge: 2023-01-21 | Disposition: A | Discharge status: Home / Self Care | Payer: 59 | Attending: Emergency Medicine | Admitting: Emergency Medicine

## 2023-01-21 DIAGNOSIS — Z79899 Other long term (current) drug therapy: Secondary | ICD-10-CM | POA: Diagnosis not present

## 2023-01-21 DIAGNOSIS — R1010 Upper abdominal pain, unspecified: Secondary | ICD-10-CM | POA: Diagnosis not present

## 2023-01-21 DIAGNOSIS — Z7982 Long term (current) use of aspirin: Secondary | ICD-10-CM | POA: Insufficient documentation

## 2023-01-21 DIAGNOSIS — I2510 Atherosclerotic heart disease of native coronary artery without angina pectoris: Secondary | ICD-10-CM | POA: Diagnosis not present

## 2023-01-21 DIAGNOSIS — R0789 Other chest pain: Secondary | ICD-10-CM | POA: Diagnosis not present

## 2023-01-21 DIAGNOSIS — R079 Chest pain, unspecified: Secondary | ICD-10-CM | POA: Diagnosis present

## 2023-01-21 DIAGNOSIS — I10 Essential (primary) hypertension: Secondary | ICD-10-CM | POA: Insufficient documentation

## 2023-01-21 DIAGNOSIS — K8020 Calculus of gallbladder without cholecystitis without obstruction: Secondary | ICD-10-CM | POA: Insufficient documentation

## 2023-01-21 DIAGNOSIS — K838 Other specified diseases of biliary tract: Secondary | ICD-10-CM | POA: Diagnosis not present

## 2023-01-21 DIAGNOSIS — K828 Other specified diseases of gallbladder: Secondary | ICD-10-CM | POA: Diagnosis not present

## 2023-01-21 LAB — CBC
HCT: 40.9 % (ref 36.0–46.0)
Hemoglobin: 13.3 g/dL (ref 12.0–15.0)
MCH: 30.4 pg (ref 26.0–34.0)
MCHC: 32.5 g/dL (ref 30.0–36.0)
MCV: 93.4 fL (ref 80.0–100.0)
Platelets: 335 10*3/uL (ref 150–400)
RBC: 4.38 MIL/uL (ref 3.87–5.11)
RDW: 12.3 % (ref 11.5–15.5)
WBC: 16.2 10*3/uL — ABNORMAL HIGH (ref 4.0–10.5)
nRBC: 0 % (ref 0.0–0.2)

## 2023-01-21 LAB — TROPONIN I (HIGH SENSITIVITY)
Troponin I (High Sensitivity): 4 ng/L (ref <18)
Troponin I (High Sensitivity): 4 ng/L (ref <18)

## 2023-01-21 LAB — HEPATIC FUNCTION PANEL
ALT: 59 U/L — ABNORMAL HIGH (ref 0–44)
AST: 139 U/L — ABNORMAL HIGH (ref 15–41)
Albumin: 4.5 g/dL (ref 3.5–5.0)
Alkaline Phosphatase: 154 U/L — ABNORMAL HIGH (ref 38–126)
Bilirubin, Direct: 0.1 mg/dL (ref 0.0–0.2)
Indirect Bilirubin: 0.8 mg/dL (ref 0.3–0.9)
Total Bilirubin: 0.9 mg/dL (ref 0.3–1.2)
Total Protein: 7.9 g/dL (ref 6.5–8.1)

## 2023-01-21 LAB — BASIC METABOLIC PANEL
Anion gap: 11 (ref 5–15)
BUN: 14 mg/dL (ref 6–20)
CO2: 25 mmol/L (ref 22–32)
Calcium: 10.4 mg/dL — ABNORMAL HIGH (ref 8.9–10.3)
Chloride: 101 mmol/L (ref 98–111)
Creatinine, Ser: 0.6 mg/dL (ref 0.44–1.00)
GFR, Estimated: >60 mL/min (ref >60)
Glucose, Bld: 125 mg/dL — ABNORMAL HIGH (ref 70–99)
Potassium: 3.5 mmol/L (ref 3.5–5.1)
Sodium: 137 mmol/L (ref 135–145)

## 2023-01-21 LAB — LIPASE, BLOOD: Lipase: 29 U/L (ref 11–51)

## 2023-01-21 MED ORDER — ALUM & MAG HYDROXIDE-SIMETH 200-200-20 MG/5ML PO SUSP
30.0000 mL | Freq: Once | ORAL | Status: AC
  Administered 2023-01-21: 30 mL via ORAL
  Filled 2023-01-21: qty 30

## 2023-01-21 MED ORDER — LIDOCAINE VISCOUS HCL 2 % MT SOLN
15.0000 mL | Freq: Once | OROMUCOSAL | Status: AC
  Administered 2023-01-21: 15 mL via ORAL
  Filled 2023-01-21: qty 15

## 2023-01-21 MED ORDER — HYDROCODONE-ACETAMINOPHEN 5-325 MG PO TABS: 1.0000 | ORAL_TABLET | Freq: Four times a day (QID) | ORAL | 0 refills | Status: AC | PRN

## 2023-01-21 NOTE — ED Triage Notes (Signed)
Pt states chest discomfort that started this am with waking up radiating to her back  Broke into sweat  Took 2 x 325 asa  Sent by Cardiology  for trop level checked

## 2023-01-21 NOTE — Telephone Encounter (Signed)
Pt c/o of Chest Pain: STAT if CP now or developed within 24 hours  1. Are you having CP right now?  No, patient denies pain. Discomfort occurred last night while patient was trying to sleep.  2. Are you experiencing any other symptoms (ex. SOB, nausea, vomiting, sweating)?  Patient states last night she was sweating profusely   3. How long have you been experiencing CP?  Discomfort lasted a few hours last night - Patient denies pain.  4. Is your CP continuous or coming and going?  Continuous   5. Have you taken Nitroglycerin?  Patient states she has never been prescribed nitroglycerin, but she took Aspirin. ?

## 2023-01-21 NOTE — Telephone Encounter (Signed)
Spoke with pt she stated that she had chest discomfort during the night with sweating. This morning she is not having any chest pain just soreness in her back and chest. She wants to make an appt to come in to be seen. Sent information to front desk for appt., discussed that she should go to ED or call 911 with chest pain, sweating, nausea. She does not have Nitroglycerin. Do you want her to have a Rx for Nitroglycerin? Pt verbalized understanding and had no further questions.

## 2023-01-21 NOTE — Telephone Encounter (Signed)
Spoke with pt and advised of Dr. Hulen Shouts recommendation. She verbalized understanding and had no further questions.

## 2023-01-21 NOTE — Discharge Instructions (Addendum)
Please follow-up with the general surgeon I have attached here for you in regards to recent symptoms and ER visit.  Today your labs are reassuring ultrasound shows that you have stones in your gallbladder causing your pain.  You may take Tylenol every 6 hours as needed for pain however if Tylenol does not control this I have given you a few days worth of Norco.  If symptoms change or worsen please return to the ER.

## 2023-01-21 NOTE — ED Provider Notes (Signed)
[facility] Provider Note   CSN: [id number] Arrival date & time: 01/21/23  1224     History  Chief Complaint  Patient presents with   Chest Pain    Brianna Fletcher is a 52 y.o. female history of mild CAD, myeloproliferative disorder, fibromyalgia, GERD, hypertension presented for chest pain that woke her up out of bed at 4:30 AM this morning.  Chest pain lasted for few hours and then resolved.  Patient did take 2 aspirin for this but is unsure if this helped or not.  Patient states that the pain was substernal and goes around both sides into her back but not through her chest and is not a ripping and tearing sensation but a dull pressure.  Patient states she has acid reflux and states this may be contributing to it but wants to be evaluated she does have family history of heart issues.  Patient denies history of enlarged aorta or aortic dissections.  Patient denies shortness of breath, abdominal pain, nausea/vomiting, fevers, left arm/left jaw pain, new onset weakness, headaches.  Patient states her cardiologist told her to come in to be evaluated and to get troponins done and basic blood work.  Home Medications Prior to Admission medications   Medication Sig Start Date End Date Taking? Authorizing Provider  amLODipine (NORVASC) 10 MG tablet TAKE 1 TABLET BY MOUTH EVERY DAY 12/31/22   Zola Button, Grayling Congress, DO  aspirin 81 MG tablet Take 81 mg by mouth daily.    [provider]  Bempedoic Acid 180 MG TABS Take 1 tablet (180 mg total) by mouth daily. 04/29/22   Baldo Daub, MD  chlorhexidine (PERIDEX) 0.12 % solution Use as directed 5 mLs in the mouth or throat daily. 01/14/20   [provider]  Cholecalciferol (VITAMIN D3) 125 MCG (5000 UT) CAPS Take 5,000 Int'l Units by mouth daily.    [provider]  escitalopram (LEXAPRO) 10 MG tablet Take 1 tablet (10 mg total) by mouth at bedtime. 10/05/22   Seabron Spates R,  DO  ezetimibe (ZETIA) 10 MG tablet TAKE 1 TABLET BY MOUTH EVERY DAY 08/05/22   Baldo Daub, MD  furosemide (LASIX) 20 MG tablet TAKE 1 TABLET BY MOUTH TWICE A DAY 12/07/22   Zola Button, Grayling Congress, DO  HYDROcodone-acetaminophen (NORCO) 5-325 MG tablet Take 1 tablet by mouth every 6 (six) hours as needed for up to 5 days for moderate pain (pain score 4-6). 01/21/23 01/26/23 Yes Sanora Cunanan, Beverly Gust, PA-C  hydrOXYzine (VISTARIL) 25 MG capsule Take 1 capsule (25 mg total) by mouth every 8 (eight) hours as needed for anxiety. 08/03/22   Lowne Chase, Yvonne R, DO  KRILL OIL PO Take 1,200 mg by mouth daily.    [provider]  levothyroxine (SYNTHROID) 50 MCG tablet Take 1 tablet (50 mcg total) by mouth daily before breakfast. 06/07/22   Zola Button, Grayling Congress, DO  MAGNESIUM PO Take 400 mg by mouth daily.    [provider]  omeprazole (PRILOSEC) 20 MG capsule TAKE 1 CAPSULE BY MOUTH EVERY DAY 11/23/22   Zola Button, Grayling Congress, DO  triamcinolone cream (KENALOG) 0.1 % Apply 1 Application topically 2 (two) times daily. 08/03/22   Donato Schultz, DO  Zinc Gluconate 30 MG TABS Take 30 mg by mouth daily.    [provider]      Allergies    Lisinopril, Losartan, Lubiprostone, Pravachol [pravastatin sodium], and Repatha [evolocumab]  Review of Systems   Review of Systems  Cardiovascular:  Positive for chest pain.    Physical Exam Updated Vital Signs BP (!) 119/49   Pulse (!) 53   Temp 97.8 F (36.6 C) (Oral)   Resp 16   Ht 5\' 6"  (1.676 m)   Wt 98.3 kg   LMP  (LMP Unknown)   SpO2 99%   BMI 34.98 kg/m  Physical Exam Vitals reviewed.  Constitutional:      General: She is not in acute distress. HENT:     Head: Normocephalic and atraumatic.  Eyes:     Extraocular Movements: Extraocular movements intact.     Conjunctiva/sclera: Conjunctivae normal.     Pupils: Pupils are equal, round, and reactive to light.  Cardiovascular:     Rate and Rhythm: Normal rate and  regular rhythm.     Pulses: Normal pulses.     Heart sounds: Normal heart sounds.     Comments: 2+ bilateral radial/dorsalis pedis pulses with regular rate Pulmonary:     Effort: Pulmonary effort is normal. No respiratory distress.     Breath sounds: Normal breath sounds.  Abdominal:     Palpations: Abdomen is soft.     Tenderness: There is no abdominal tenderness. There is no guarding or rebound.  Musculoskeletal:        General: Normal range of motion.     Cervical back: Normal range of motion and neck supple.     Comments: 5 out of 5 bilateral grip/leg extension strength  Skin:    General: Skin is warm and dry.     Capillary Refill: Capillary refill takes less than 2 seconds.  Neurological:     General: No focal deficit present.     Mental Status: She is alert and oriented to person, place, and time.     Comments: Sensation intact in all 4 limbs  Psychiatric:        Mood and Affect: Mood normal.     ED Results / Procedures / Treatments   Labs (all labs ordered are listed, but only abnormal results are displayed) Labs Reviewed  BASIC METABOLIC PANEL - Abnormal; Notable for the following components:      Result Value   Glucose, Bld 125 (*)    Calcium 10.4 (*)    All other components within normal limits  CBC - Abnormal; Notable for the following components:   WBC 16.2 (*)    All other components within normal limits  HEPATIC FUNCTION PANEL - Abnormal; Notable for the following components:   AST 139 (*)    ALT 59 (*)    Alkaline Phosphatase 154 (*)    All other components within normal limits  LIPASE, BLOOD  TROPONIN I (HIGH SENSITIVITY)  TROPONIN I (HIGH SENSITIVITY)    EKG EKG Interpretation Date/Time:  Friday January 21 2023 12:37:34 EDT Ventricular Rate:  56 PR Interval:  153 QRS Duration:  100 QT Interval:  424 QTC Calculation: 410 R Axis:   54  Text Interpretation: Sinus rhythm when compared to prior, overall similar appearance. NO STEMI Confirmed by  Theda Belfast ([id number]) on 01/21/2023 12:47:19 PM  Radiology US Abdomen Limited RUQ (LIVER/GB)  Result Date: 01/21/2023 CLINICAL DATA:  Chest pain with swelling and GERD overnight, left upper quadrant pain, elevated LFTs EXAM: ULTRASOUND ABDOMEN LIMITED RIGHT UPPER QUADRANT COMPARISON:  CT abdomen/pelvis 04/27/2022, FINDINGS: Gallbladder: The gallbladder is distended with multiple echogenic stones measuring up to 8 mm as well as gallbladder sludge. Sonographic Murphy's sign  was reported negative. Common bile duct: Diameter: 4 mm Liver: No focal lesion identified. Within normal limits in parenchymal echogenicity. Portal vein is patent on color Doppler imaging with normal direction of blood flow towards the liver. Other: None. IMPRESSION: Distended gallbladder with cholelithiasis but no sonographic evidence of acute cholecystitis. Electronically Signed   By: Lesia Hausen M.D.   On: 01/21/2023 15:52   DG Chest 2 View  Result Date: 01/21/2023 CLINICAL DATA:  Chest discomfort EXAM: CHEST - 2 VIEW COMPARISON:  Chest radiograph 04/27/2022 FINDINGS: The cardiomediastinal silhouette is normal There is no focal consolidation or pulmonary edema. There is no pleural effusion or pneumothorax There is no acute osseous abnormality. IMPRESSION: No radiographic evidence of acute cardiopulmonary process. Electronically Signed   By: Lesia Hausen M.D.   On: 01/21/2023 15:38    Procedures Procedures    Medications Ordered in ED Medications  alum & mag hydroxide-simeth (MAALOX/MYLANTA) 200-200-20 MG/5ML suspension 30 mL (30 mLs Oral Given 01/21/23 1328)    And  lidocaine (XYLOCAINE) 2 % viscous mouth solution 15 mL (15 mLs Oral Given 01/21/23 1328)    ED Course/ Medical Decision Making/ A&P                                 Medical Decision Making Amount and/or Complexity of Data Reviewed Labs: ordered. Radiology: ordered.  Risk OTC drugs. Prescription drug management.   Brianna Fletcher 52 y.o. presented  today for chest pain. Working DDx that I considered at this time includes, but not limited to, ACS, GERD, pulmonary embolism, community-acquired pneumonia, aortic dissection, pneumothorax, underlying bony abnormality, anemia, thyrotoxicosis, esophageal rupture, CHF exacerbation, valvular disorder, myocarditis, pericarditis, endocarditis, pericardial effusion/cardiac tamponade, pulmonary edema, gastritis/PUD, esophagitis, hepatobiliary.  R/o Dx: ACS, pulmonary embolism, community-acquired pneumonia, aortic dissection, pneumothorax, underlying bony abnormality, anemia, thyrotoxicosis, esophageal rupture, CHF exacerbation, valvular disorder, myocarditis, pericarditis, endocarditis, pericardial effusion/cardiac tamponade, pulmonary edema, gastritis/PUD, esophagitis: These are considered less likely due to history of present illness and physical exam findings. Aortic Dissection: less likely based on the location, quality, onset, and severity of symptoms in this case. Patient has a lack of serious comorbidities for this condition including a lack of HTN or Smoking. Patient also has a lack of underlying history of AD or TAA.   Review of prior external notes: 11/30/2022 office visit  Unique Tests and My Interpretation:  EKG: Rate, rhythm, axis, intervals all examined and without medically relevant abnormality. ST segments without concerns for elevations Troponin: 4 CXR: No acute changes CBC: Leukocytosis 16.2, this appears chronic BMP: Unremarkable Lipase: Unremarkable Hepatic function panel: Mild elevation AST ALT 139/59, mild elevation alkaline phos 154 RUQ Ultrasound: Cholelithiasis without signs of cholecystitis  Social Determinants of Health: none  Discussion with Independent Historian:  husband  Discussion of Management of Tests: None  Risk: Medium: prescription drug management  Risk Stratification Score: HEART: 2  Plan: On exam patient was in no acute distress with stable vitals.  Patient's physical was unremarkable. Labs and CXR will be ordered.  Will give GI cocktail and add on hepatic function panel along with lipase as patient states she does have a history of GERD and that this may be contributing.  Patient stable at this time.  On recheck patient did feel better after the GI cocktail and so suspect that patient's chest pain may be GERD related as patient does state this was very similar to previous GERD exacerbations.  Labs do  show a mild elevation in the liver and gallbladder enzymes and so we will get ultrasound to further evaluate.  Delta troponins were negative.  Ultrasound does show cholelithiasis which would explain patient's symptoms.  There are no signs of cholecystitis or other concerning features.  Patient be given general surgery follow-up regurgitate Tylenol every 6 hours needed for pain however pain after the Tylenol did not prescribe Norco.  At this time a very low suspicion of any life-threatening diagnoses as patient's cardiac workup was reassuring and the patient's pain could be radiation from the cholelithiasis.  Patient was given return precautions. Patient stable for discharge at this time.  Patient verbalized understanding of plan.  This chart was dictated using voice recognition software.  Despite best efforts to proofread,  errors can occur which can change the documentation meaning.         Final Clinical Impression(s) / ED Diagnoses Final diagnoses:  Calculus of gallbladder without cholecystitis without obstruction    Rx / DC Orders ED Discharge Orders          Ordered    HYDROcodone-acetaminophen (NORCO) 5-325 MG tablet  Every 6 hours PRN        01/21/23 1604              Netta Corrigan, PA-C 01/21/23 1624    Tegeler, Canary Brim, MD 01/22/23 1515

## 2023-01-24 ENCOUNTER — Ambulatory Visit: Payer: 59

## 2023-01-25 DIAGNOSIS — E213 Hyperparathyroidism, unspecified: Secondary | ICD-10-CM | POA: Diagnosis not present

## 2023-01-25 DIAGNOSIS — Z780 Asymptomatic menopausal state: Secondary | ICD-10-CM | POA: Diagnosis not present

## 2023-01-25 DIAGNOSIS — M810 Age-related osteoporosis without current pathological fracture: Secondary | ICD-10-CM | POA: Diagnosis not present

## 2023-01-26 DIAGNOSIS — E213 Hyperparathyroidism, unspecified: Secondary | ICD-10-CM | POA: Diagnosis not present

## 2023-01-26 DIAGNOSIS — R946 Abnormal results of thyroid function studies: Secondary | ICD-10-CM | POA: Diagnosis not present

## 2023-01-27 DIAGNOSIS — K8010 Calculus of gallbladder with chronic cholecystitis without obstruction: Secondary | ICD-10-CM | POA: Diagnosis not present

## 2023-01-27 DIAGNOSIS — E210 Primary hyperparathyroidism: Secondary | ICD-10-CM | POA: Diagnosis not present

## 2023-02-02 ENCOUNTER — Other Ambulatory Visit: Payer: Self-pay | Admitting: Family Medicine

## 2023-02-02 DIAGNOSIS — D471 Chronic myeloproliferative disease: Secondary | ICD-10-CM

## 2023-02-02 DIAGNOSIS — E039 Hypothyroidism, unspecified: Secondary | ICD-10-CM

## 2023-02-02 DIAGNOSIS — E041 Nontoxic single thyroid nodule: Secondary | ICD-10-CM

## 2023-02-03 NOTE — Progress Notes (Signed)
Cardiology Office Note:    Date:  02/04/2023   ID:  Brianna Fletcher, DOB 01-24-1971, MRN [id number]  PCP:  Brianna Schultz, DO  Cardiologist:  Brianna Herrlich, MD    Referring MD: Brianna Fletcher, Brianna Fletcher, *    ASSESSMENT:    1. Mild CAD   2. Mixed hyperlipidemia   3. Statin intolerance   4. Essential hypertension   5. Chest pain of uncertain etiology    PLAN:    In order of problems listed above:  Cardiology perspective doing well stable mild nonflow limiting CAD certainly is optimized for planned cholecystectomy and appropriate cardiac medications and I agree with outpatient surgery no further cardiac evaluation Doing well continue nonstatin bempedoic acid effective tolerated Well-controlled the day of surgery asked her to hold her loop diuretic and long-acting calcium channel blocker Secondary to gallbladder disease   Next appointment: 1 year   Medication Adjustments/Labs and Tests Ordered: Current medicines are reviewed at length with the patient today.  Concerns regarding medicines are outlined above.  Orders Placed This Encounter  Procedures   EKG 12-Lead   No orders of the defined types were placed in this encounter.    History of Present Illness:    Brianna Fletcher is a 52 y.o. female with a hx of mild CAD on cardiac CTA June 2022 most severe 24 to 49% stenosis proximal LAD with an elevated coronary calcium score of 244 hyperlipidemia statin intolerance and bempedoic acid and hypertension.  Last seen 04/29/2022.Coronary angiography performed 10/03/2014 showed normal coronary arteriography and left ventricular function.  A myocardial perfusion study in 2016 showed equivocal abnormal perfusion with inferior defect perfusion and low normal ejection fraction 53% and echocardiogram showed normal left ventricular size function wall thickness left atrium was described as moderately to severely enlarged however on volume index this was mild to moderate.  CTA of the chest  2014 showed coronary artery atherosclerosis that was described as advanced for age localized left anterior descending coronary artery the thoracic aorta was normal.   She was in the emergency room [facility] 01/21/2023 for chest pain.  High-sensitivity troponin was low in the rule out soon for ACS chest x-ray showed no abnormality and EKGs were normal.  Compliance with diet, lifestyle and medications: Yes  She is scheduled for laparoscopic cholecystectomy She also has hypercalcemia being evaluated for parathyroid adenoma From a cardiology perspective doing well not having angina edema shortness of breath palpitation syncope She is on effective lipid-lowering therapy bempedoic acid statin intolerant lipid profile in September LDL 65 cholesterol 150 Before surgery I asked her to hold her loop diuretic in the morning and long-acting calcium channel blocker Anticipates outpatient same-day surgery Past Medical History:  Diagnosis Date   Arthritis    Back pain    Blood dyscrasia    produces too many WBCs   Chronic neutrophilia 11/15/2012   Depression    Edema, lower extremity    Family history of heart disease    mother had stents placed in her 18s and had myocardial infarctions   Fibromyalgia    GERD (gastroesophageal reflux disease)    Glaucoma    Glaucoma    High cholesterol    Hypertension    IBS (irritable bowel syndrome)    Joint pain    Leukocytosis, unspecified 11/15/2012   Monocytosis 11/15/2012   Myeloproliferative disorder (HCC) 03/08/2013   Precordial chest pain    Pruritus 11/15/2012   Radial artery occlusion, right (HCC)  status post  right radial artery cardiac catheterization   Thyroid disease    Vaginal delivery 1986, 2000   Vitamin D deficiency     Current Medications: Current Meds  Medication Sig   amLODipine (NORVASC) 10 MG tablet TAKE 1 TABLET BY MOUTH EVERY DAY   aspirin 81 MG tablet Take 81 mg by mouth daily.   Bempedoic Acid 180 MG TABS Take 1  tablet (180 mg total) by mouth daily.   chlorhexidine (PERIDEX) 0.12 % solution Use as directed 5 mLs in the mouth or throat daily.   escitalopram (LEXAPRO) 10 MG tablet Take 1 tablet (10 mg total) by mouth at bedtime.   ezetimibe (ZETIA) 10 MG tablet TAKE 1 TABLET BY MOUTH EVERY DAY   furosemide (LASIX) 20 MG tablet TAKE 1 TABLET BY MOUTH TWICE A DAY   levothyroxine (SYNTHROID) 50 MCG tablet TAKE 1 TABLET BY MOUTH DAILY BEFORE BREAKFAST   MAGNESIUM PO Take 400 mg by mouth daily.   omeprazole (PRILOSEC) 20 MG capsule TAKE 1 CAPSULE BY MOUTH EVERY DAY (Patient taking differently: Take 20 mg by mouth daily. TAKE 1 CAPSULE BY MOUTH EVERY DAY)   Vitamin D, Ergocalciferol, (DRISDOL) 1.25 MG (50000 UNIT) CAPS capsule Take 50,000 Units by mouth every 7 (seven) days.   Zinc Gluconate 30 MG TABS Take 30 mg by mouth daily.      EKGs/Labs/Other Studies Reviewed:    The following studies were reviewed today:  Cardiac Studies & Procedures   CARDIAC CATHETERIZATION  CARDIAC CATHETERIZATION 10/03/2014  Narrative Images from the original result were not included.  The left ventricular systolic function is normal.  Brianna Fletcher is a 52 y.o. female   [id number] LOCATION:  FACILITY: [facility] PHYSICIAN: Brianna Fletcher, M.D. [birth year]   DATE OF PROCEDURE:  10/03/2014  DATE OF DISCHARGE:     CARDIAC CATHETERIZATION    History obtained from chart review. Brianna Fletcher is a 52 year old moderately overweight divorced Caucasian female mother of 2 children who works at [employer]. She is accompanied by her boyfriend Brianna Fletcher today. She was referred by her primary care physician, Dr. Loreen Fletcher , for cardiovascular evaluation because of chest pain and positive risk factors. Her risk factor profile is notable for family history with a mother who has had stents and myocardial infarction in her 71s. She smoked remotely. She is not diabetic. There are no other risk factors. She does have a myeloproliferative  disorder and gets weekly interferon shots. [name] also had heavy menstrual periods with anemia requiring transfusion.  Six weeks ago she had a prolonged episode of substernal chest pain (10-15 minutes associated with shortness of breath or left upper extremity radiation. Subsequent to the echo was normal and a Myoview stress test showed diaphragmatic attenuation without ischemia. Over the last several days she's had episodes of weakness, diaphoresis nausea and shortness of breath with left upper extremity radiation. Based on this, I decided to proceed with outpatient diagnostic coronary arteriography to define her anatomy and rule out an ischemic etiology. I have thoroughly explained the risks and benefits the patient and her boyfriend.    IMPRESSION:Brianna Fletcher has normal coronary arteries and normal left ventricular function. Her procedure was performed radially. She received weight-based heparin and radial cocktail. The radial sheath was removed and a TR band was placed on the right wrist twitching patent hemostasis. The patient left the lab in stable condition. She'll be treated for noncardiac causes of chest pain. She'll be discharged home as an outpatient and see me back  in several weeks for follow-up  Brianna Fletcher. MD, [facility] 10/03/2014 2:32 PM  Findings Coronary Findings Diagnostic  Dominance: Right  No diagnostic findings have been documented. Intervention  No interventions have been documented.   STRESS TESTS  MYOCARDIAL PERFUSION IMAGING 09/18/2014  Narrative  Nuclear stress EF: 53%.  There was no ST segment deviation noted during stress.  The left ventricular ejection fraction low normal 53%.  This study is equivocal as it is significantly affected by artifacts. There is resting perfusion defect in the entire inferior and basal and mid inferolateral walls that slightly improves at stress suggestive of diaphragmatic artifact only. However there is also TID of 1.2.  An  alternative imaging modality such as coronary CTA is recommended.   ECHOCARDIOGRAM  ECHOCARDIOGRAM COMPLETE 09/18/2014  Narrative *[facility]* [address] [place], [place] [address] [phone]  ------------------------------------------------------------------- Transthoracic Echocardiography  Patient:    Brianna Fletcher, Brianna Fletcher MR #:       [id number] Study Date: 09/18/2014 Gender:     F Age:        44 Height:     170.2 cm Weight:     102.5 kg BSA:        2.24 m^2 Pt. Status: Room:  ATTENDING    Lynford Humphrey REFERRING    Brianna Fletcher R SONOGRAPHER  Dewitt Hoes, RDCS PERFORMING   [facility], Outpatient  cc:  ------------------------------------------------------------------- LV EF: 55% -   60%  ------------------------------------------------------------------- Indications:      Chest pain (R07.9).  ------------------------------------------------------------------- History:   Risk factors:  Hyperparathyroidism. Chronic neutrophilia. pruritus. Monocytosis. Myeloproliferative disorder. Leukocytosis. Vitamin D deficiency.  ------------------------------------------------------------------- Study Conclusions  - Left ventricle: The cavity size was normal. Wall thickness was normal. Systolic function was normal. The estimated ejection fraction was in the range of 55% to 60%. Wall motion was normal; there were no regional wall motion abnormalities. Left ventricular diastolic function parameters were normal. - Left atrium: The atrium was moderately to severely dilated.  Transthoracic echocardiography.  M-mode, complete 2D, spectral Doppler, and color Doppler.  Birthdate:  Patient birthdate: 05/23/1970.  Age:  Patient is 52 yr old.  Sex:  Gender: female. BMI: 35.4 kg/m^2.  Blood pressure:     144/84  Patient status: Outpatient.  Study date:  Study date: 09/18/2014. Study time: 08:49 AM.  Location:  [facility]  -------------------------------------------------------------------  ------------------------------------------------------------------- Left ventricle:  The cavity size was normal. Wall thickness was normal. Systolic function was normal. The estimated ejection fraction was in the range of 55% to 60%. Wall motion was normal; there were no regional wall motion abnormalities. The transmitral flow pattern was normal. The deceleration time of the early transmitral flow velocity was normal. The pulmonary vein flow pattern was normal. The tissue Doppler parameters were normal. Left ventricular diastolic function parameters were normal.  ------------------------------------------------------------------- Aortic valve:   Structurally normal valve. Trileaflet. Cusp separation was normal.  Doppler:  Transvalvular velocity was within the normal range. There was no stenosis. There was no regurgitation.  ------------------------------------------------------------------- Aorta:  Aortic root: The aortic root was normal in size. Ascending aorta: The ascending aorta was normal in size.  ------------------------------------------------------------------- Mitral valve:   Structurally normal valve.   Leaflet separation was normal.  Doppler:  Transvalvular velocity was within the normal range. There was no evidence for stenosis. There was no regurgitation.    Peak gradient (D): 2 mm Hg.  ------------------------------------------------------------------- Left atrium:  The atrium was moderately  to severely dilated.  ------------------------------------------------------------------- Right ventricle:  The cavity size was normal. Wall thickness was normal. Systolic function was normal.  ------------------------------------------------------------------- Pulmonic valve:   Poorly visualized.  Doppler:  There was no significant  regurgitation.  ------------------------------------------------------------------- Tricuspid valve:   Structurally normal valve.   Leaflet separation was normal.  Doppler:  Transvalvular velocity was within the normal range. There was no regurgitation.  ------------------------------------------------------------------- Pulmonary artery:    Systolic pressure could not be accurately estimated.  ------------------------------------------------------------------- Right atrium:  The atrium was normal in size.  ------------------------------------------------------------------- Pericardium:  There was no pericardial effusion.  ------------------------------------------------------------------- Measurements  Left ventricle                         Value        Reference LV ID, ED, PLAX chordal        (H)     53.2  mm     43 - 52 LV ID, ES, PLAX chordal                30.5  mm     23 - 38 LV fx shortening, PLAX chordal         43    %      >=29 LV PW thickness, ED                    8.96  mm     --------- IVS/LV PW ratio, ED                    1.21         <=1.3 Stroke volume, 2D                      79    ml     --------- Stroke volume/bsa, 2D                  35    ml/m^2 --------- LV e&', lateral                         10.6  cm/s   --------- LV E/e&', lateral                       6.84         --------- LV e&', medial                          8.95  cm/s   --------- LV E/e&', medial                        8.1          --------- LV e&', average                         9.78  cm/s   --------- LV E/e&', average                       7.42         ---------  Ventricular septum                     Value        Reference IVS thickness, ED  10.8  mm     ---------  LVOT                                   Value        Reference LVOT ID, S                             21    mm     --------- LVOT area                              3.46  cm^2   --------- LVOT peak velocity, S                   88.8  cm/s   --------- LVOT mean velocity, S                  63    cm/s   --------- LVOT VTI, S                            22.9  cm     ---------  Aorta                                  Value        Reference Aortic root ID, ED                     32    mm     ---------  Left atrium                            Value        Reference LA ID, A-P, ES                         49    mm     --------- LA ID/bsa, A-P                         2.19  cm/m^2 <=2.2 LA volume, S                           93    ml     --------- LA volume/bsa, S                       41.5  ml/m^2 --------- LA volume, ES, 1-p A4C                 82    ml     --------- LA volume/bsa, ES, 1-p A4C             36.6  ml/m^2 --------- LA volume, ES, 1-p A2C                 97    ml     --------- LA volume/bsa, ES, 1-p A2C             43.3  ml/m^2 ---------  Mitral valve  Value        Reference Mitral E-wave peak velocity            72.5  cm/s   --------- Mitral A-wave peak velocity            67.6  cm/s   --------- Mitral deceleration time               218   ms     150 - 230 Mitral peak gradient, D                2     mm Hg  --------- Mitral E/A ratio, peak                 1.1          ---------  Systemic veins                         Value        Reference Estimated CVP                          3     mm Hg  ---------  Right ventricle                        Value        Reference RV s&', lateral, S                      15.8  cm/s   ---------  Legend: (L)  and  (H)  mark values outside specified reference range.  ------------------------------------------------------------------- Prepared and Electronically Authenticated by  Thurmon Fair, MD 2016-06-29T10:32:55     CT SCANS  CT CORONARY MORPH W/CTA COR W/SCORE 08/29/2020  Addendum 08/29/2020  3:09 PM ADDENDUM REPORT: 08/29/2020 15:07  HISTORY: Chest pain/anginal equiv, 26yr CHD risk > 20%, not  treadmill candidate  EXAM: Cardiac/Coronary  CT  TECHNIQUE: The patient was scanned on a [employer].  PROTOCOL: A 120 kV prospective scan was triggered in the descending thoracic aorta at 111 HU's. Axial non-contrast 3 mm slices were carried out through the heart. The data set was analyzed on a dedicated work station and scored using the Agatston method. Gantry rotation speed was 250 msecs and collimation was .6 mm. Beta blockade and 0.8 mg of sl NTG was given. The 3D data set was reconstructed in 5% intervals of the 35-75 % of the R-R cycle. Systolic and diastolic phases were analyzed on a dedicated work station using MPR, MIP and VRT modes. The patient received OMNIPAQUE IOHEXOL 350 MG/ML SOLN contrast.  FINDINGS: Image quality: Adequate.  Noise artifact is: Moderate, reduced signal to noise ratio due to body habitus and mild respiratory motion artifact.  Coronary calcium score is 244, which places the patient in the 99th percentile for age and sex matched control.  Coronary arteries: Normal coronary origins.  Right dominance.  Right Coronary Artery: Minimal mixed atherosclerotic plaque in the proximal RCA and distal RCA, <25% stenosis.  Left Main Coronary Artery: Short left main with no detectable plaque or stenosis.  Left Anterior Descending Coronary Artery: Mild mixed atherosclerotic plaque in the proximal LAD, 25-49% stenosis. The lesion at the proximal to mid LAD has mild stenosis 25-49% stenosis with positive remodeling. Minimal mixed atherosclerotic plaque in the mid LAD, <25% stenosis. Two diagonal branches which appear patent without detectable plaque  or stenosis.  Left Circumflex Artery: Minimal mixed atherosclerotic plaque in the proximal L circumflex artery, <25% stenosis. 3 patent obtuse marginal branches.  Aorta: Normal size, 31 mm at the mid ascending aorta (level of the PA bifurcation) measured double oblique. No calcifications.  No dissection.  Aortic Valve: No calcifications.  Other findings:  Normal pulmonary vein drainage into the left atrium.  Normal left atrial appendage without a thrombus.  Normal size of the pulmonary artery.  IMPRESSION: 1. Mild CAD, CADRADS = 2 in the proximal LAD.  2. Coronary calcium score is 244, which places the patient in the 99th percentile for age and sex matched control.  3. Normal coronary origin with right dominance.   Electronically Signed By: Weston Brass On: 08/29/2020 15:07  Narrative EXAM: OVER-READ INTERPRETATION  CT CHEST  The following report is an over-read performed by radiologist Dr. Jeronimo Greaves of [facility] on 08/29/2020. This over-read does not include interpretation of cardiac or coronary anatomy or pathology. The coronary CTA interpretation by the cardiologist is attached.  COMPARISON:  Chest radiograph 04/27/2018. Report of chest CT of 11/15/2012  FINDINGS: Vascular: Aortic atherosclerosis. No central pulmonary embolism, on this non-dedicated study.  Mediastinum/Nodes: No imaged thoracic adenopathy. Fluid level in the esophagus on 15/9.  Lungs/Pleura: No pleural fluid.  Clear imaged lungs.  Upper Abdomen: Normal imaged portions of the liver, spleen, stomach.  Musculoskeletal: No acute osseous abnormality.  IMPRESSION: 1. No acute findings in the imaged extracardiac chest. 2. Esophageal air fluid level suggests dysmotility or gastroesophageal reflux. 3. Aortic Atherosclerosis (ICD10-I70.0).  Electronically Signed: By: Jeronimo Greaves M.D. On: 08/29/2020 10:59          EKG Interpretation Date/Time:  Friday February 04 2023 10:27:13 EST Ventricular Rate:  52 PR Interval:  150 QRS Duration:  86 QT Interval:  436 QTC Calculation: 405 R Axis:   12  Text Interpretation: Sinus bradycardia Minimal voltage criteria for LVH, may be normal variant ( R in aVL ) When compared with ECG of 21-Jan-2023 12:37, PREVIOUS  ECG IS PRESENT Confirmed by Brianna Fletcher ([id number]) on 02/04/2023 10:38:11 AM   Recent Labs: 11/30/2022: TSH 0.75 01/21/2023: ALT 59; BUN 14; Creatinine, Ser 0.60; Hemoglobin 13.3; Platelets 335; Potassium 3.5; Sodium 137  Recent Lipid Panel    Component Value Date/Time   CHOL 150 11/30/2022 1111   CHOL 209 (H) 10/08/2021 1415   TRIG 182.0 (H) 11/30/2022 1111   HDL 48.00 11/30/2022 1111   HDL 47 10/08/2021 1415   CHOLHDL 3 11/30/2022 1111   VLDL 36.4 11/30/2022 1111   LDLCALC 65 11/30/2022 1111   LDLCALC 127 (H) 10/08/2021 1415   LDLDIRECT 121.0 02/02/2022 1111    Physical Exam:    VS:  BP 122/68   Pulse 62   Ht 5\' 6"  (1.676 m)   Wt 216 lb (98 kg)   LMP  (LMP Unknown)   SpO2 98%   BMI 34.86 kg/m     Wt Readings from Last 3 Encounters:  02/04/23 216 lb (98 kg)  01/21/23 216 lb 11.4 oz (98.3 kg)  11/30/22 216 lb 12.8 oz (98.3 kg)     GEN:  Well nourished, well developed in no acute distress HEENT: Normal NECK: No JVD; No carotid bruits LYMPHATICS: No lymphadenopathy CARDIAC: RRR, no murmurs, rubs, gallops RESPIRATORY:  Clear to auscultation without rales, wheezing or rhonchi  ABDOMEN: Soft, non-tender, non-distended MUSCULOSKELETAL:  No edema; No deformity  SKIN: Warm and dry NEUROLOGIC:  Alert and oriented x 3  PSYCHIATRIC:  Normal affect    Signed, Brianna Herrlich, MD  02/04/2023 10:55 AM    [facility]

## 2023-02-04 ENCOUNTER — Encounter: Payer: Self-pay | Admitting: Cardiology

## 2023-02-04 ENCOUNTER — Ambulatory Visit: Payer: 59 | Attending: Cardiology | Admitting: Cardiology

## 2023-02-04 VITALS — BP 122/68 | HR 62 | Ht 66.0 in | Wt 216.0 lb

## 2023-02-04 DIAGNOSIS — E782 Mixed hyperlipidemia: Secondary | ICD-10-CM

## 2023-02-04 DIAGNOSIS — I10 Essential (primary) hypertension: Secondary | ICD-10-CM | POA: Diagnosis not present

## 2023-02-04 DIAGNOSIS — Z789 Other specified health status: Secondary | ICD-10-CM

## 2023-02-04 DIAGNOSIS — R079 Chest pain, unspecified: Secondary | ICD-10-CM | POA: Diagnosis not present

## 2023-02-04 DIAGNOSIS — I2510 Atherosclerotic heart disease of native coronary artery without angina pectoris: Secondary | ICD-10-CM | POA: Diagnosis not present

## 2023-02-04 NOTE — Patient Instructions (Signed)
Medication Instructions:  Your physician recommends that you continue on your current medications as directed. Please refer to the Current Medication list given to you today.  *If you need a refill on your cardiac medications before your next appointment, please call your pharmacy*   Lab Work: None If you have labs (blood work) drawn today and your tests are completely normal, you will receive your results only by: MyChart Message (if you have MyChart) OR A paper copy in the mail If you have any lab test that is abnormal or we need to change your treatment, we will call you to review the results.   Testing/Procedures: None   Follow-Up: At [facility], you and your health needs are our priority.  As part of our continuing mission to provide you with exceptional heart care, we have created designated Provider Care Teams.  These Care Teams include your primary Cardiologist (physician) and Advanced Practice Providers (APPs -  Physician Assistants and Nurse Practitioners) who all work together to provide you with the care you need, when you need it.  We recommend signing up for the patient portal called "MyChart".  Sign up information is provided on this After Visit Summary.  MyChart is used to connect with patients for Virtual Visits (Telemedicine).  Patients are able to view lab/test results, encounter notes, upcoming appointments, etc.  Non-urgent messages can be sent to your provider as well.   To learn more about what you can do with MyChart, go to [email or web address].    Your next appointment:   1 year(s)  Provider:   Brian Munley, MD    Other Instructions None  

## 2023-02-15 DIAGNOSIS — H40013 Open angle with borderline findings, low risk, bilateral: Secondary | ICD-10-CM | POA: Diagnosis not present

## 2023-02-15 DIAGNOSIS — H52223 Regular astigmatism, bilateral: Secondary | ICD-10-CM | POA: Diagnosis not present

## 2023-02-15 DIAGNOSIS — H524 Presbyopia: Secondary | ICD-10-CM | POA: Diagnosis not present

## 2023-02-15 DIAGNOSIS — H5203 Hypermetropia, bilateral: Secondary | ICD-10-CM | POA: Diagnosis not present

## 2023-02-22 DIAGNOSIS — E210 Primary hyperparathyroidism: Secondary | ICD-10-CM | POA: Diagnosis not present

## 2023-02-25 ENCOUNTER — Inpatient Hospital Stay (HOSPITAL_BASED_OUTPATIENT_CLINIC_OR_DEPARTMENT_OTHER): Payer: 59 | Admitting: Hematology & Oncology

## 2023-02-25 ENCOUNTER — Inpatient Hospital Stay: Payer: 59 | Attending: Hematology & Oncology

## 2023-02-25 ENCOUNTER — Other Ambulatory Visit: Payer: Self-pay

## 2023-02-25 ENCOUNTER — Encounter: Payer: Self-pay | Admitting: Hematology & Oncology

## 2023-02-25 VITALS — BP 133/61 | HR 61 | Temp 97.9°F | Resp 18 | Ht 66.0 in | Wt 222.0 lb

## 2023-02-25 DIAGNOSIS — D471 Chronic myeloproliferative disease: Secondary | ICD-10-CM | POA: Insufficient documentation

## 2023-02-25 DIAGNOSIS — D72828 Other elevated white blood cell count: Secondary | ICD-10-CM | POA: Diagnosis not present

## 2023-02-25 LAB — CMP (CANCER CENTER ONLY)
ALT: 9 U/L (ref 0–44)
AST: 14 U/L — ABNORMAL LOW (ref 15–41)
Albumin: 4.6 g/dL (ref 3.5–5.0)
Alkaline Phosphatase: 93 U/L (ref 38–126)
Anion gap: 8 (ref 5–15)
BUN: 15 mg/dL (ref 6–20)
CO2: 28 mmol/L (ref 22–32)
Calcium: 11.3 mg/dL — ABNORMAL HIGH (ref 8.9–10.3)
Chloride: 103 mmol/L (ref 98–111)
Creatinine: 0.75 mg/dL (ref 0.44–1.00)
GFR, Estimated: >60 mL/min (ref >60)
Glucose, Bld: 124 mg/dL — ABNORMAL HIGH (ref 70–99)
Potassium: 4 mmol/L (ref 3.5–5.1)
Sodium: 139 mmol/L (ref 135–145)
Total Bilirubin: 0.7 mg/dL (ref <1.2)
Total Protein: 7.7 g/dL (ref 6.5–8.1)

## 2023-02-25 LAB — CBC WITH DIFFERENTIAL (CANCER CENTER ONLY)
Abs Immature Granulocytes: 0.21 10*3/uL — ABNORMAL HIGH (ref 0.00–0.07)
Basophils Absolute: 0.1 10*3/uL (ref 0.0–0.1)
Basophils Relative: 0 %
Eosinophils Absolute: 0.1 10*3/uL (ref 0.0–0.5)
Eosinophils Relative: 1 %
HCT: 39.2 % (ref 36.0–46.0)
Hemoglobin: 12.9 g/dL (ref 12.0–15.0)
Immature Granulocytes: 1 %
Lymphocytes Relative: 17 %
Lymphs Abs: 3.1 10*3/uL (ref 0.7–4.0)
MCH: 30.9 pg (ref 26.0–34.0)
MCHC: 32.9 g/dL (ref 30.0–36.0)
MCV: 93.8 fL (ref 80.0–100.0)
Monocytes Absolute: 1 10*3/uL (ref 0.1–1.0)
Monocytes Relative: 6 %
Neutro Abs: 13.4 10*3/uL — ABNORMAL HIGH (ref 1.7–7.7)
Neutrophils Relative %: 75 %
Platelet Count: 328 10*3/uL (ref 150–400)
RBC: 4.18 MIL/uL (ref 3.87–5.11)
RDW: 12.2 % (ref 11.5–15.5)
WBC Count: 17.9 10*3/uL — ABNORMAL HIGH (ref 4.0–10.5)
nRBC: 0 % (ref 0.0–0.2)

## 2023-02-25 LAB — LACTATE DEHYDROGENASE: LDH: 143 U/L (ref 98–192)

## 2023-02-25 NOTE — Progress Notes (Signed)
Hematology and Oncology Follow Up Visit  Brianna Fletcher [id number] 07/09/1970 52 y.o. 02/25/2023   Principle Diagnosis:  Chronic myeloproliferative syndrome-Triple negative Sub-clinical hypothyroidism (+) p-ANCA  Current Therapy:   Peg-Interferon q weekly - q 4 week dosing - On hold due to cost Synthroid 0.050 mg po q day   Interim History: Brianna Fletcher is here today for follow-up.  Unfortunately, Brianna Fletcher really has had a tough time since we last saw Brianna Fletcher.  Brianna Fletcher boyfriend died.  Brianna Fletcher mom died.  Brianna Fletcher is going to have thyroid and gallbladder surgery.  I just really feel bad for Brianna Fletcher.  I know that Brianna Fletcher has a lot going on right now.  I just feel bad that Brianna Fletcher has had so much going on.  Brianna Fletcher seems to be managing as well as possible.  I have to give Brianna Fletcher a lot of credit..  Brianna Fletcher apparently has some thyroid nodules.  Brianna Fletcher is going to have surgery at [facility].  Brianna Fletcher has had some bony pain.  Brianna Fletcher does get this every now and then.  Brianna Fletcher has had no fever recently.  Brianna Fletcher did have some temperature when Brianna Fletcher had a gallbladder attack.  Brianna Fletcher has had no bleeding.  There is been no obvious change in bowel or bladder habits.  Thankfully, Brianna Fletcher still is able to keep in touch with Brianna Fletcher 2 grandchildren.  1 lives in [place] and 1 lives in [place].  Overall, I would have to say that Brianna Fletcher performance status right now is ECOG 1.   Wt Readings from Last 3 Encounters:  02/04/23 216 lb (98 kg)  01/21/23 216 lb 11.4 oz (98.3 kg)  11/30/22 216 lb 12.8 oz (98.3 kg)    Medications:  Allergies as of 02/25/2023       Reactions   Lisinopril Cough   Losartan Cough   Lubiprostone Nausea And Vomiting   Pravachol [pravastatin Sodium] Other (See Comments)   "Causes pain in my body"   Repatha [evolocumab] Cough   Severe cough that would not respond to any treatment - per patient.        Medication List        Accurate as of February 25, 2023  3:45 PM. If you have any questions, ask your nurse or doctor.           amLODipine 10 MG tablet Commonly known as: NORVASC TAKE 1 TABLET BY MOUTH EVERY DAY   aspirin 81 MG tablet Take 81 mg by mouth daily.   Bempedoic Acid 180 MG Tabs Take 1 tablet (180 mg total) by mouth daily.   chlorhexidine 0.12 % solution Commonly known as: PERIDEX Use as directed 5 mLs in the mouth or throat daily.   escitalopram 10 MG tablet Commonly known as: LEXAPRO Take 1 tablet (10 mg total) by mouth at bedtime.   ezetimibe 10 MG tablet Commonly known as: ZETIA TAKE 1 TABLET BY MOUTH EVERY DAY   furosemide 20 MG tablet Commonly known as: LASIX TAKE 1 TABLET BY MOUTH TWICE A DAY   hydrOXYzine 25 MG capsule Commonly known as: VISTARIL Take 1 capsule (25 mg total) by mouth every 8 (eight) hours as needed for anxiety.   KRILL OIL PO Take 1,200 mg by mouth daily.   levothyroxine 50 MCG tablet Commonly known as: SYNTHROID TAKE 1 TABLET BY MOUTH DAILY BEFORE BREAKFAST   MAGNESIUM PO Take 400 mg by mouth daily.   omeprazole 20 MG capsule Commonly known as: PRILOSEC TAKE 1 CAPSULE BY MOUTH EVERY  DAY What changed:  how much to take additional instructions   triamcinolone cream 0.1 % Commonly known as: KENALOG Apply 1 Application topically 2 (two) times daily.   Vitamin D (Ergocalciferol) 1.25 MG (50000 UNIT) Caps capsule Commonly known as: DRISDOL Take 50,000 Units by mouth every 7 (seven) days.   Vitamin D3 125 MCG (5000 UT) Caps Take 5,000 Int'l Units by mouth daily.   Zinc Gluconate 30 MG Tabs Take 30 mg by mouth daily.        Allergies:  Allergies  Allergen Reactions   Lisinopril Cough   Losartan Cough   Lubiprostone Nausea And Vomiting   Pravachol [Pravastatin Sodium] Other (See Comments)    "Causes pain in my body"   Repatha [Evolocumab] Cough    Severe cough that would not respond to any treatment - per patient.    Past Medical History, Surgical history, Social history, and Family History were reviewed and updated.  Review of  Systems: Review of Systems  Constitutional:  Positive for malaise/fatigue.  HENT: Negative.    Eyes: Negative.   Respiratory: Negative.    Cardiovascular: Negative.   Gastrointestinal:  Positive for diarrhea.  Genitourinary: Negative.   Musculoskeletal:  Positive for myalgias.  Skin: Negative.   Neurological:  Positive for dizziness.  Psychiatric/Behavioral: Negative.       Physical Exam:  Vital signs show temperature of 97.9.  Pulse 61.  Blood pressure 133/61.  Weight is 222 pounds.  Wt Readings from Last 3 Encounters:  02/04/23 216 lb (98 kg)  01/21/23 216 lb 11.4 oz (98.3 kg)  11/30/22 216 lb 12.8 oz (98.3 kg)    Physical Exam Vitals reviewed.  HENT:     Head: Normocephalic and atraumatic.  Eyes:     Pupils: Pupils are equal, round, and reactive to light.  Cardiovascular:     Rate and Rhythm: Normal rate and regular rhythm.     Heart sounds: Normal heart sounds.  Pulmonary:     Effort: Pulmonary effort is normal.     Breath sounds: Normal breath sounds.  Abdominal:     General: Bowel sounds are normal.     Palpations: Abdomen is soft.  Musculoskeletal:        General: No tenderness or deformity. Normal range of motion.     Cervical back: Normal range of motion.  Lymphadenopathy:     Cervical: No cervical adenopathy.  Skin:    General: Skin is warm and dry.     Findings: No erythema or rash.  Neurological:     Mental Status: Brianna Fletcher is alert and oriented to person, place, and time.  Psychiatric:        Behavior: Behavior normal.        Thought Content: Thought content normal.        Judgment: Judgment normal.   Lab Results  Component Value Date   WBC 17.9 (H) 02/25/2023   HGB 12.9 02/25/2023   HCT 39.2 02/25/2023   MCV 93.8 02/25/2023   PLT 328 02/25/2023   Lab Results  Component Value Date   FERRITIN 60 08/03/2021   IRON 89 08/03/2021   TIBC 350 08/03/2021   UIBC 261 08/03/2021   IRONPCTSAT 25 08/03/2021   Lab Results  Component Value Date    RETICCTPCT 2.1 04/12/2017   RBC 4.18 02/25/2023   RETICCTABS 116.1 11/20/2013   No results found for: "KPAFRELGTCHN", "LAMBDASER", "KAPLAMBRATIO" No results found for: "IGGSERUM", "IGA", "IGMSERUM" No results found for: "TOTALPROTELP", "ALBUMINELP", "A1GS", "A2GS", "BETS", "BETA2SER", "GAMS", "  MSPIKE", "SPEI"   Chemistry      Component Value Date/Time   NA 137 01/21/2023 1236   NA 138 10/08/2021 1415   NA 140 02/08/2017 0925   NA 136 03/10/2016 1147   K 3.5 01/21/2023 1236   K 3.4 02/08/2017 0925   K 3.9 03/10/2016 1147   CL 101 01/21/2023 1236   CL 105 02/08/2017 0925   CO2 25 01/21/2023 1236   CO2 26 02/08/2017 0925   CO2 24 03/10/2016 1147   BUN 14 01/21/2023 1236   BUN 9 10/08/2021 1415   BUN 10 02/08/2017 0925   BUN 10.2 03/10/2016 1147   CREATININE 0.60 01/21/2023 1236   CREATININE 0.57 05/20/2022 1006   CREATININE 0.7 02/08/2017 0925   CREATININE 0.7 03/10/2016 1147      Component Value Date/Time   CALCIUM 10.4 (H) 01/21/2023 1236   CALCIUM 10.4 (H) 02/08/2017 0925   CALCIUM 10.7 (H) 03/10/2016 1147   ALKPHOS 154 (H) 01/21/2023 1236   ALKPHOS 106 (H) 02/08/2017 0925   ALKPHOS 132 03/10/2016 1147   AST 139 (H) 01/21/2023 1236   AST 14 (L) 05/20/2022 1006   AST 12 03/10/2016 1147   ALT 59 (H) 01/21/2023 1236   ALT 12 05/20/2022 1006   ALT 16 02/08/2017 0925   ALT 8 03/10/2016 1147   BILITOT 0.9 01/21/2023 1236   BILITOT 0.6 05/20/2022 1006   BILITOT 0.63 03/10/2016 1147     No diagnosis found.   Impression and Plan: Brianna Fletcher is a very pleasant 52 yo caucasian female with a triple negative myeloproliferative syndrome though all work up has been negative so far.   From my point of view, I really do not see a problem with Brianna Fletcher having thyroid surgery or gallbladder surgery.  I do not think there will be an issue with Brianna Fletcher healing.  Again I feel bad that Brianna Fletcher has had some was death in the family.  Brianna Fletcher now is taking care of Brianna Fletcher father who has spinal  issues.  Thankfully, Brianna Fletcher does have I think 2 sons who have been able to help out even though they live quite a ways off.  We will still plan to get Brianna Fletcher back to see [place].  I would like to probably get Brianna Fletcher back to see [place] in about 3 months.  I think we had to have Brianna Fletcher come back a little bit sooner just because of the upcoming surgeries.  Josph Macho, MD 12/6/20243:45 PM

## 2023-03-17 ENCOUNTER — Telehealth: Payer: Self-pay | Admitting: Family Medicine

## 2023-03-17 NOTE — Telephone Encounter (Signed)
Copied from CRM [id number]. Topic: Medicare AWV >> Mar 17, 2023  2:31 PM Payton Doughty wrote: Reason for CRM: Called LVM 03/17/2023 to schedule AWV. Please schedule office or virtual visits.  Verlee Rossetti; Care Guide Ambulatory Clinical Support [facility] Direct Dial: [phone]

## 2023-03-21 ENCOUNTER — Ambulatory Visit: Payer: 59 | Admitting: Cardiology

## 2023-04-01 DIAGNOSIS — D351 Benign neoplasm of parathyroid gland: Secondary | ICD-10-CM | POA: Diagnosis not present

## 2023-04-01 DIAGNOSIS — E210 Primary hyperparathyroidism: Secondary | ICD-10-CM | POA: Diagnosis not present

## 2023-04-06 ENCOUNTER — Other Ambulatory Visit (HOSPITAL_COMMUNITY): Payer: Self-pay

## 2023-04-06 ENCOUNTER — Other Ambulatory Visit: Payer: Self-pay | Admitting: Family Medicine

## 2023-04-06 ENCOUNTER — Telehealth: Payer: Self-pay | Admitting: Pharmacy Technician

## 2023-04-06 DIAGNOSIS — F418 Other specified anxiety disorders: Secondary | ICD-10-CM

## 2023-04-06 NOTE — Telephone Encounter (Signed)
 Pharmacy Patient Advocate Encounter   Received notification from CoverMyMeds that prior authorization for Nexletol  180mg  is required/requested.   Insurance verification completed.   The patient is insured through [facility] .   Per test claim: Refill too soon. PA is not needed at this time. Medication was filled 03/02/23. Next eligible fill date is 05/09/23.

## 2023-05-03 ENCOUNTER — Telehealth: Payer: Self-pay

## 2023-05-03 ENCOUNTER — Other Ambulatory Visit: Payer: Self-pay | Admitting: Family Medicine

## 2023-05-03 DIAGNOSIS — J101 Influenza due to other identified influenza virus with other respiratory manifestations: Secondary | ICD-10-CM

## 2023-05-03 MED ORDER — OSELTAMIVIR PHOSPHATE 75 MG PO CAPS: 75.0000 mg | ORAL_CAPSULE | Freq: Two times a day (BID) | ORAL | 0 refills | Status: DC

## 2023-05-03 NOTE — Telephone Encounter (Signed)
Copied from CRM [id number]. Topic: Clinical - Medication Question >> May 03, 2023  3:34 PM Irine Seal wrote: Reason for CRM: patient tested positive for flu A from her drug store, patient was told to call and report it to her PCP she is asking for TAMIFLU  to be called in   [id number] - [place], [place] - [address] AT [facility] [address] [place] [address] Phone: [phone] Fax: [phone]  Patient callback [phone] (M)

## 2023-05-13 ENCOUNTER — Other Ambulatory Visit: Payer: Self-pay | Admitting: Cardiology

## 2023-05-15 ENCOUNTER — Other Ambulatory Visit: Payer: Self-pay | Admitting: Family Medicine

## 2023-05-15 DIAGNOSIS — I10 Essential (primary) hypertension: Secondary | ICD-10-CM

## 2023-05-20 ENCOUNTER — Ambulatory Visit: Payer: 59 | Admitting: Family

## 2023-05-20 ENCOUNTER — Other Ambulatory Visit: Payer: 59

## 2023-05-24 ENCOUNTER — Ambulatory Visit: Payer: 59 | Admitting: Family

## 2023-05-24 ENCOUNTER — Inpatient Hospital Stay: Payer: 59

## 2023-05-30 ENCOUNTER — Ambulatory Visit: Payer: 59 | Admitting: Family Medicine

## 2023-06-13 ENCOUNTER — Inpatient Hospital Stay (HOSPITAL_BASED_OUTPATIENT_CLINIC_OR_DEPARTMENT_OTHER): Admitting: Family

## 2023-06-13 ENCOUNTER — Inpatient Hospital Stay: Attending: Hematology & Oncology

## 2023-06-13 VITALS — BP 121/64 | HR 65 | Temp 98.4°F | Resp 20 | Ht 66.0 in | Wt 228.0 lb

## 2023-06-13 DIAGNOSIS — D72828 Other elevated white blood cell count: Secondary | ICD-10-CM

## 2023-06-13 DIAGNOSIS — E039 Hypothyroidism, unspecified: Secondary | ICD-10-CM | POA: Insufficient documentation

## 2023-06-13 DIAGNOSIS — D471 Chronic myeloproliferative disease: Secondary | ICD-10-CM | POA: Insufficient documentation

## 2023-06-13 LAB — CBC WITH DIFFERENTIAL (CANCER CENTER ONLY)
Abs Immature Granulocytes: 0.33 10*3/uL — ABNORMAL HIGH (ref 0.00–0.07)
Basophils Absolute: 0.1 10*3/uL (ref 0.0–0.1)
Basophils Relative: 0 %
Eosinophils Absolute: 0.1 10*3/uL (ref 0.0–0.5)
Eosinophils Relative: 1 %
HCT: 40.8 % (ref 36.0–46.0)
Hemoglobin: 13.3 g/dL (ref 12.0–15.0)
Immature Granulocytes: 2 %
Lymphocytes Relative: 14 %
Lymphs Abs: 2.3 10*3/uL (ref 0.7–4.0)
MCH: 30.5 pg (ref 26.0–34.0)
MCHC: 32.6 g/dL (ref 30.0–36.0)
MCV: 93.6 fL (ref 80.0–100.0)
Monocytes Absolute: 0.8 10*3/uL (ref 0.1–1.0)
Monocytes Relative: 5 %
Neutro Abs: 12.3 10*3/uL — ABNORMAL HIGH (ref 1.7–7.7)
Neutrophils Relative %: 78 %
Platelet Count: 332 10*3/uL (ref 150–400)
RBC: 4.36 MIL/uL (ref 3.87–5.11)
RDW: 12.1 % (ref 11.5–15.5)
WBC Count: 15.8 10*3/uL — ABNORMAL HIGH (ref 4.0–10.5)
nRBC: 0 % (ref 0.0–0.2)

## 2023-06-13 LAB — CMP (CANCER CENTER ONLY)
ALT: 9 U/L (ref 0–44)
AST: 12 U/L — ABNORMAL LOW (ref 15–41)
Albumin: 5 g/dL (ref 3.5–5.0)
Alkaline Phosphatase: 78 U/L (ref 38–126)
Anion gap: 10 (ref 5–15)
BUN: 16 mg/dL (ref 6–20)
CO2: 28 mmol/L (ref 22–32)
Calcium: 9.8 mg/dL (ref 8.9–10.3)
Chloride: 101 mmol/L (ref 98–111)
Creatinine: 0.62 mg/dL (ref 0.44–1.00)
GFR, Estimated: >60 mL/min (ref >60)
Glucose, Bld: 93 mg/dL (ref 70–99)
Potassium: 4.4 mmol/L (ref 3.5–5.1)
Sodium: 139 mmol/L (ref 135–145)
Total Bilirubin: 0.5 mg/dL (ref 0.0–1.2)
Total Protein: 7.7 g/dL (ref 6.5–8.1)

## 2023-06-13 LAB — SAVE SMEAR(SSMR), FOR PROVIDER SLIDE REVIEW

## 2023-06-13 LAB — LACTATE DEHYDROGENASE: LDH: 156 U/L (ref 98–192)

## 2023-06-13 NOTE — Progress Notes (Signed)
 Hematology and Oncology Follow Up Visit  Brianna Fletcher [id number] 07/20/1970 53 y.o. 06/13/2023   Principle Diagnosis:  Chronic myeloproliferative syndrome-Triple negative Sub-clinical hypothyroidism (+) p-ANCA   Current Therapy:        Peg-Interferon q weekly - q 4 week dosing - On hold due to cost Synthroid 0.050 mg po q day   Interim History:  [name] is here today for follow-up. She is doing fairly well. The past 9 months have been mentally and emotionally hard for her. She has lost her mother, fiance, cousin and most recently an aunt who was like a second mother.  She has a great family support system and is leaning into them for comfort.  Her counts have remained stable. WBC 15.8, Hgb 13.3 MCV 93 and platelets 332.  She has had no issues with infection.  Her parathyroidectomy back in January went well.  She also states that she needs to have her gallbladder removed but this has been on hold.  No fever, chills, n/v, cough, rash, dizziness, SOB, chest pain, palpitations, abdominal pain or changes in bowel or bladder habits.  No swelling, numbness or tingling in her extremities at this time.  No falls or syncope reported.  Appetite and hydration are good. Weight is stable at 228 lbs.   ECOG Performance Status: 1 - Symptomatic but completely ambulatory  Medications:  Allergies as of 06/13/2023       Reactions   Lisinopril Cough   Losartan Cough   Lubiprostone Nausea And Vomiting   Pravachol [pravastatin Sodium] Other (See Comments)   "Causes pain in my body"   Repatha [evolocumab] Cough   Severe cough that would not respond to any treatment - per patient.        Medication List        Accurate as of June 13, 2023 10:41 AM. If you have any questions, ask your nurse or doctor.          amLODipine 10 MG tablet Commonly known as: NORVASC TAKE 1 TABLET BY MOUTH EVERY DAY   aspirin 81 MG tablet Take 81 mg by mouth daily.   Bempedoic Acid 180 MG Tabs Take  1 tablet (180 mg total) by mouth daily.   chlorhexidine 0.12 % solution Commonly known as: PERIDEX Use as directed 5 mLs in the mouth or throat daily.   escitalopram 10 MG tablet Commonly known as: LEXAPRO TAKE 1 TABLET BY MOUTH EVERYDAY AT BEDTIME   ezetimibe 10 MG tablet Commonly known as: ZETIA Take 1 tablet (10 mg total) by mouth daily.   furosemide 20 MG tablet Commonly known as: LASIX TAKE 1 TABLET BY MOUTH TWICE A DAY   levothyroxine 50 MCG tablet Commonly known as: SYNTHROID TAKE 1 TABLET BY MOUTH DAILY BEFORE BREAKFAST   MAGNESIUM PO Take 400 mg by mouth daily.   omeprazole 20 MG capsule Commonly known as: PRILOSEC TAKE 1 CAPSULE BY MOUTH EVERY DAY What changed:  how much to take additional instructions   oseltamivir 75 MG capsule Commonly known as: Tamiflu Take 1 capsule (75 mg total) by mouth 2 (two) times daily.   Vitamin D (Ergocalciferol) 1.25 MG (50000 UNIT) Caps capsule Commonly known as: DRISDOL Take 50,000 Units by mouth every 7 (seven) days.   Vitamin D3 125 MCG (5000 UT) Caps Take 5,000 Int'l Units by mouth daily.   Zinc Gluconate 30 MG Tabs Take 30 mg by mouth daily.        Allergies:  Allergies  Allergen Reactions  Lisinopril Cough   Losartan Cough   Lubiprostone Nausea And Vomiting   Pravachol [Pravastatin Sodium] Other (See Comments)    "Causes pain in my body"   Repatha [Evolocumab] Cough    Severe cough that would not respond to any treatment - per patient.    Past Medical History, Surgical history, Social history, and Family History were reviewed and updated.  Review of Systems: All other 10 point review of systems is negative.   Physical Exam:  vitals were not taken for this visit.   Wt Readings from Last 3 Encounters:  02/25/23 222 lb (100.7 kg)  02/04/23 216 lb (98 kg)  01/21/23 216 lb 11.4 oz (98.3 kg)    Ocular: Sclerae unicteric, pupils equal, round and reactive to light Ear-nose-throat: Oropharynx clear,  dentition fair Lymphatic: No cervical or supraclavicular adenopathy Lungs no rales or rhonchi, good excursion bilaterally Heart regular rate and rhythm, no murmur appreciated Abd soft, nontender, positive bowel sounds MSK no focal spinal tenderness, no joint edema Neuro: non-focal, well-oriented, appropriate affect Breasts: Deferred   Lab Results  Component Value Date   WBC 17.9 (H) 02/25/2023   HGB 12.9 02/25/2023   HCT 39.2 02/25/2023   MCV 93.8 02/25/2023   PLT 328 02/25/2023   Lab Results  Component Value Date   FERRITIN 60 08/03/2021   IRON 89 08/03/2021   TIBC 350 08/03/2021   UIBC 261 08/03/2021   IRONPCTSAT 25 08/03/2021   Lab Results  Component Value Date   RETICCTPCT 2.1 04/12/2017   RBC 4.18 02/25/2023   RETICCTABS 116.1 11/20/2013   No results found for: "KPAFRELGTCHN", "LAMBDASER", "KAPLAMBRATIO" No results found for: "IGGSERUM", "IGA", "IGMSERUM" No results found for: "TOTALPROTELP", "ALBUMINELP", "A1GS", "A2GS", "BETS", "BETA2SER", "GAMS", "MSPIKE", "SPEI"   Chemistry      Component Value Date/Time   NA 139 02/25/2023 1514   NA 138 10/08/2021 1415   NA 140 02/08/2017 0925   NA 136 03/10/2016 1147   K 4.0 02/25/2023 1514   K 3.4 02/08/2017 0925   K 3.9 03/10/2016 1147   CL 103 02/25/2023 1514   CL 105 02/08/2017 0925   CO2 28 02/25/2023 1514   CO2 26 02/08/2017 0925   CO2 24 03/10/2016 1147   BUN 15 02/25/2023 1514   BUN 9 10/08/2021 1415   BUN 10 02/08/2017 0925   BUN 10.2 03/10/2016 1147   CREATININE 0.75 02/25/2023 1514   CREATININE 0.7 02/08/2017 0925   CREATININE 0.7 03/10/2016 1147      Component Value Date/Time   CALCIUM 11.3 (H) 02/25/2023 1514   CALCIUM 10.4 (H) 02/08/2017 0925   CALCIUM 10.7 (H) 03/10/2016 1147   ALKPHOS 93 02/25/2023 1514   ALKPHOS 106 (H) 02/08/2017 0925   ALKPHOS 132 03/10/2016 1147   AST 14 (L) 02/25/2023 1514   AST 12 03/10/2016 1147   ALT 9 02/25/2023 1514   ALT 16 02/08/2017 0925   ALT 8 03/10/2016  1147   BILITOT 0.7 02/25/2023 1514   BILITOT 0.63 03/10/2016 1147       Impression and Plan: [name] is a very pleasant 53 yo caucasian female with a triple negative myeloproliferative syndrome though all work up has been negative so far.  No intervention needed at this time.  Follow-up in 4 months.   Eileen Stanford, NP 3/24/202510:41 AM

## 2023-06-19 ENCOUNTER — Other Ambulatory Visit: Payer: Self-pay | Admitting: Cardiology

## 2023-06-20 NOTE — Telephone Encounter (Signed)
 Prescription sent to pharmacy.

## 2023-06-28 DIAGNOSIS — H40013 Open angle with borderline findings, low risk, bilateral: Secondary | ICD-10-CM | POA: Diagnosis not present

## 2023-07-10 ENCOUNTER — Other Ambulatory Visit: Payer: Self-pay | Admitting: Family Medicine

## 2023-07-10 DIAGNOSIS — R600 Localized edema: Secondary | ICD-10-CM

## 2023-08-22 ENCOUNTER — Encounter: Payer: Self-pay | Admitting: Family Medicine

## 2023-08-22 ENCOUNTER — Ambulatory Visit (INDEPENDENT_AMBULATORY_CARE_PROVIDER_SITE_OTHER): Admitting: Family Medicine

## 2023-08-22 VITALS — BP 118/54 | HR 61 | Temp 97.9°F | Resp 16 | Ht 66.0 in | Wt 232.0 lb

## 2023-08-22 DIAGNOSIS — E559 Vitamin D deficiency, unspecified: Secondary | ICD-10-CM

## 2023-08-22 DIAGNOSIS — E785 Hyperlipidemia, unspecified: Secondary | ICD-10-CM

## 2023-08-22 DIAGNOSIS — E538 Deficiency of other specified B group vitamins: Secondary | ICD-10-CM

## 2023-08-22 DIAGNOSIS — I10 Essential (primary) hypertension: Secondary | ICD-10-CM | POA: Diagnosis not present

## 2023-08-22 DIAGNOSIS — D471 Chronic myeloproliferative disease: Secondary | ICD-10-CM | POA: Diagnosis not present

## 2023-08-22 DIAGNOSIS — F418 Other specified anxiety disorders: Secondary | ICD-10-CM

## 2023-08-22 DIAGNOSIS — E039 Hypothyroidism, unspecified: Secondary | ICD-10-CM | POA: Diagnosis not present

## 2023-08-22 NOTE — Progress Notes (Signed)
 Established Patient Office Visit  Subjective   Patient ID: Brianna Fletcher, female    DOB: February 01, 1971  Age: 53 y.o. MRN: [id number]  Chief Complaint  Patient presents with   Hypertension    Here for follow up   Hypothyroidism    Here for follow up    HPI Discussed the use of AI scribe software for clinical note transcription with the patient, who gave verbal consent to proceed.  History of Present Illness Brianna Fletcher is a 53 year old female who presents for a follow-up visit after missing a previous appointment due to a family bereavement.  She has been experiencing significant emotional distress following the death of her aunt in 06/23/23, whom she described as 'like my second mom.' This event led to a period of depression characterized by daily crying and lack of motivation to get out of bed. She has managed to improve with the support of her aunt's friends who encouraged her to engage in social activities, without increasing her medication for anxiety and depression.  She mentions her uncle is very sick and awaiting cancer test results, which is causing her additional stress. She is concerned about the potential impact on her mental health if the results are unfavorable.  She has been living in her mother's house since inheriting it last year and is facing financial challenges due to high property taxes. She is seeking a reduction based on her disability status, having been on total disability since 2019.  She experiences swelling in her ankles on days when she is on her feet a lot, but this is managed with diuretics. No swelling in the ankles unless on feet for extended periods.  She is unable to travel to see her children and grandchildren due to her father's declining health. He is now in a wheelchair and requires her assistance for daily activities and appointments. She is exploring options for respite care but notes her father's resistance to such arrangements.    Patient  Active Problem List   Diagnosis Date Noted   Pain of toe of left foot 08/03/2022   Insect bite of left lower leg 08/03/2022   Drug-induced myopathy 02/01/2022   Nonintractable headache 03/05/2021   Embolism and thrombosis of arteries of the upper extremities (HCC) 02/17/2021   Preventative health care 08/04/2020   Colon cancer screening 08/04/2020   Need for hepatitis C screening test 08/04/2020   Vaginal delivery    Thyroid  disease    Precordial chest pain    Joint pain    IBS (irritable bowel syndrome)    Hypertension    High cholesterol    Glaucoma    GERD (gastroesophageal reflux disease)    Fibromyalgia    Family history of heart disease    Edema, lower extremity    Depression with anxiety    Blood dyscrasia    Back pain    Arthritis    Viral upper respiratory tract infection 04/30/2020   Post-COVID chronic cough 04/30/2020   Lower extremity edema 01/31/2020   E. coli infection 12/08/2017   Hyperlipidemia 12/09/2016   Hypothyroidism 09/23/2016   Arthralgia 09/07/2016   Bruising 05/09/2016   Change in stool habits 05/11/2015   Rectal bleeding 05/11/2015   Generalized abdominal pain 05/11/2015   S/P hysterectomy 03/05/2015   LUQ pain 01/14/2015   Constipation 01/14/2015   Severe obesity (BMI >= 40) (HCC) 12/23/2014   Morbid obesity (HCC) 12/23/2014   Radial artery occlusion, right (HCC) 12/20/2014   HTN (hypertension)  11/04/2014   Right arm pain 11/04/2014   Essential hypertension 11/04/2014   Cough 11/04/2014   Ischemic chest pain (HCC)    Vitamin D  deficiency 08/26/2014   Cramp in lower leg 08/26/2014   Right thyroid  nodule 07/22/2014   Hyperparathyroidism, primary (HCC) 07/22/2014   Myeloproliferative disorder (HCC) 03/08/2013   Leukocytosis 11/15/2012   Chronic neutrophilia 11/15/2012   Pruritus 11/15/2012   Monocytosis 11/15/2012   Past Medical History:  Diagnosis Date   Arthritis    Back pain    Blood dyscrasia    produces too many WBCs    Chronic neutrophilia 11/15/2012   Depression    Edema, lower extremity    Family history of heart disease    mother had stents placed in her 57s and had myocardial infarctions   Fibromyalgia    GERD (gastroesophageal reflux disease)    Glaucoma    Glaucoma    High cholesterol    Hypertension    IBS (irritable bowel syndrome)    Joint pain    Leukocytosis, unspecified 11/15/2012   Monocytosis 11/15/2012   Myeloproliferative disorder (HCC) 03/08/2013   Precordial chest pain    Pruritus 11/15/2012   Radial artery occlusion, right (HCC)    status post  right radial artery cardiac catheterization   Thyroid  disease    Vaginal delivery 1986, 2000   Vitamin D  deficiency    Past Surgical History:  Procedure Laterality Date   CARDIAC CATHETERIZATION N/A 10/03/2014   Procedure: Left Heart Cath and Coronary Angiography;  Surgeon: Avanell Leigh, MD;  Location: [facility] INVASIVE CV LAB;  Service: Cardiovascular;  Laterality: N/A;   DILITATION & CURRETTAGE/HYSTROSCOPY WITH NOVASURE ABLATION N/A 11/08/2014   Procedure: DILATATION & CURETTAGE/HYSTEROSCOPY WITH NOVASURE ABLATION;  Surgeon: Dyanna Glasgow, DO;  Location: [facility] ORS;  Service: Gynecology;  Laterality: N/A;   EYE SURGERY     laser eye surgery   LAPAROSCOPIC VAGINAL HYSTERECTOMY WITH SALPINGECTOMY Bilateral 03/05/2015   Procedure: LAPAROSCOPIC ASSISTED VAGINAL HYSTERECTOMY WITH SALPINGECTOMY;  Surgeon: Dyanna Glasgow, DO;  Location: [facility] ORS;  Service: Gynecology;  Laterality: Bilateral;   TUBAL LIGATION  2000   Social History   Tobacco Use   Smoking status: Never   Smokeless tobacco: Never   Tobacco comments:    08/03/2021 Never smoked.  Vaping Use   Vaping status: Never Used  Substance Use Topics   Alcohol use: Not Currently    Alcohol/week: 6.0 standard drinks of alcohol    Types: 6 Cans of beer per week   Drug use: No   Social History   Socioeconomic History   Marital status: Divorced    Spouse name: Not on file   Number of  children: Not on file   Years of education: Not on file   Highest education level: Not on file  Occupational History   Occupation: unemployed  Tobacco Use   Smoking status: Never   Smokeless tobacco: Never   Tobacco comments:    08/03/2021 Never smoked.  Vaping Use   Vaping status: Never Used  Substance and Sexual Activity   Alcohol use: Not Currently    Alcohol/week: 6.0 standard drinks of alcohol    Types: 6 Cans of beer per week   Drug use: No   Sexual activity: Not Currently    Partners: Male  Other Topics Concern   Not on file  Social History Narrative   Very little exercise   Social Drivers of Health   Financial Resource Strain: Low Risk  (12/10/2020)   Overall  Financial Resource Strain (CARDIA)    Difficulty of Paying Living Expenses: Not hard at all  Food Insecurity: Low Risk  (01/14/2023)   Received from [facility]   Hunger Vital Sign    Worried About Running Out of Food in the Last Year: Never true    Ran Out of Food in the Last Year: Never true  Transportation Needs: No Transportation Needs (01/14/2023)   Received from [facility]    In the past 12 months, has lack of reliable transportation kept you from medical appointments, meetings, work or from getting things needed for daily living? : No  Physical Activity: Inactive (12/10/2020)   Exercise Vital Sign    Days of Exercise per Week: 0 days    Minutes of Exercise per Session: 0 min  Stress: No Stress Concern Present (12/10/2020)   [employer] of Occupational Health - Occupational Stress Questionnaire    Feeling of Stress : Only a little  Social Connections: Moderately Isolated (12/10/2020)   Social Connection and Isolation Panel [NHANES]    Frequency of Communication with Friends and Family: More than three times a week    Frequency of Social Gatherings with Friends and Family: More than three times a week    Attends Religious Services: Never    Database administrator or  Organizations: No    Attends Banker Meetings: Never    Marital Status: Living with partner  Intimate Partner Violence: Not At Risk (03/17/2022)   Humiliation, Afraid, Rape, and Kick questionnaire    Fear of Current or Ex-Partner: No    Emotionally Abused: No    Physically Abused: No    Sexually Abused: No   Family Status  Relation Name Status   Mother  Deceased at age 60   Father  Deceased   MGM  Deceased       48   MGF  Deceased   Mat Aunt  Alive   Mat Uncle  Deceased at age 74       cva, GI bleed   Cousin  Deceased at age 19       MI   Neg Hx  (Not Specified)  No partnership data on file   Family History  Problem Relation Age of Onset   Heart disease Mother    Hypertension Mother    Hyperlipidemia Mother    Depression Mother    Sleep apnea Mother    Obesity Mother    Diabetes Father    Cancer Father        esopageal ?   Alcoholism Father    Dementia Maternal Grandmother    Heart disease Maternal Grandmother    Heart disease Maternal Grandfather 50       MI   Heart disease Cousin    Other Neg Hx        hyperparathyroidism   Allergies  Allergen Reactions   Lisinopril  Cough   Losartan  Cough   Lubiprostone  Nausea And Vomiting   Pravachol  [Pravastatin  Sodium] Other (See Comments)    "Causes pain in my body"   Repatha  [Evolocumab ] Cough    Severe cough that would not respond to any treatment - per patient.      Review of Systems  Constitutional:  Negative for fever and malaise/fatigue.  HENT:  Negative for congestion.   Eyes:  Negative for blurred vision.  Respiratory:  Negative for cough and shortness of breath.   Cardiovascular:  Negative for chest pain, palpitations and leg swelling.  Gastrointestinal:  Negative for abdominal pain, blood in stool, nausea and vomiting.  Genitourinary:  Negative for dysuria and frequency.  Musculoskeletal:  Negative for back pain and falls.  Skin:  Negative for rash.  Neurological:  Negative for dizziness,  loss of consciousness and headaches.  Endo/Heme/Allergies:  Negative for environmental allergies.  Psychiatric/Behavioral:  Negative for depression. The patient is not nervous/anxious.       Objective:     BP (!) 118/54 (BP Location: Left Arm, Patient Position: Sitting, Cuff Size: Large)   Pulse 61   Temp 97.9 F (36.6 C) (Oral)   Resp 16   Ht 5\' 6"  (1.676 m)   Wt 232 lb (105.2 kg)   LMP  (LMP Unknown)   SpO2 98%   BMI 37.45 kg/m  BP Readings from Last 3 Encounters:  08/22/23 (!) 118/54  06/13/23 121/64  02/25/23 133/61   Wt Readings from Last 3 Encounters:  08/22/23 232 lb (105.2 kg)  06/13/23 228 lb (103.4 kg)  02/25/23 222 lb (100.7 kg)   SpO2 Readings from Last 3 Encounters:  08/22/23 98%  06/13/23 100%  02/25/23 100%      Physical Exam Vitals and nursing note reviewed.  Constitutional:      General: She is not in acute distress.    Appearance: Normal appearance. She is well-developed.  HENT:     Head: Normocephalic and atraumatic.  Eyes:     General: No scleral icterus.       Right eye: No discharge.        Left eye: No discharge.  Cardiovascular:     Rate and Rhythm: Normal rate and regular rhythm.     Heart sounds: No murmur heard. Pulmonary:     Effort: Pulmonary effort is normal. No respiratory distress.     Breath sounds: Normal breath sounds.  Musculoskeletal:        General: Normal range of motion.     Cervical back: Normal range of motion and neck supple.     Right lower leg: No edema.     Left lower leg: No edema.  Skin:    General: Skin is warm and dry.  Neurological:     Mental Status: She is alert and oriented to person, place, and time.  Psychiatric:        Mood and Affect: Mood normal.        Behavior: Behavior normal.        Thought Content: Thought content normal.        Judgment: Judgment normal.      No results found for any visits on 08/22/23.  Last CBC Lab Results  Component Value Date   WBC 15.8 (H) 06/13/2023    HGB 13.3 06/13/2023   HCT 40.8 06/13/2023   MCV 93.6 06/13/2023   MCH 30.5 06/13/2023   RDW 12.1 06/13/2023   PLT 332 06/13/2023   Last metabolic panel Lab Results  Component Value Date   GLUCOSE 93 06/13/2023   NA 139 06/13/2023   K 4.4 06/13/2023   CL 101 06/13/2023   CO2 28 06/13/2023   BUN 16 06/13/2023   CREATININE 0.62 06/13/2023   GFRNONAA >60 06/13/2023   CALCIUM 9.8 06/13/2023   PROT 7.7 06/13/2023   ALBUMIN 5.0 06/13/2023   LABGLOB 2.9 10/08/2021   AGRATIO 1.7 10/08/2021   BILITOT 0.5 06/13/2023   ALKPHOS 78 06/13/2023   AST 12 (L) 06/13/2023   ALT 9 06/13/2023   ANIONGAP 10 06/13/2023   Last lipids  Lab Results  Component Value Date   CHOL 150 11/30/2022   HDL 48.00 11/30/2022   LDLCALC 65 11/30/2022   LDLDIRECT 121.0 02/02/2022   TRIG 182.0 (H) 11/30/2022   CHOLHDL 3 11/30/2022   Last hemoglobin A1c No results found for: "HGBA1C" Last thyroid  functions Lab Results  Component Value Date   TSH 0.75 11/30/2022   T4TOTAL 12.4 (H) 12/09/2016   Last vitamin D  Lab Results  Component Value Date   VD25OH 30.25 11/30/2022   Last vitamin B12 and Folate Lab Results  Component Value Date   VITAMINB12 1,112 (H) 11/30/2022   FOLATE  06/01/2010    15.8 (NOTE)  Reference Ranges        Deficient:       0.4 - 3.3 ng/mL        Indeterminate:   3.4 - 5.4 ng/mL        Normal:              > 5.4 ng/mL      The 10-year ASCVD risk score (Arnett DK, et al., 2019) is: 1.9%    Assessment & Plan:   Problem List Items Addressed This Visit       Unprioritized   HTN (hypertension)   Relevant Orders   CBC with Differential/Platelet   Comprehensive metabolic panel with GFR   Lipid panel   TSH   Vitamin D  deficiency   Relevant Orders   VITAMIN D  25 Hydroxy (Vit-D Deficiency, Fractures)   Depression with anxiety   Myeloproliferative disorder (HCC) - Primary (Chronic)   Morbid obesity (HCC)   Hypothyroidism   Check labs  Con't synthroid        Relevant  Orders   CBC with Differential/Platelet   Comprehensive metabolic panel with GFR   Lipid panel   TSH   Hyperlipidemia   Encourage heart healthy diet such as MIND or DASH diet, increase exercise, avoid trans fats, simple carbohydrates and processed foods, consider a krill or fish or flaxseed oil cap daily.        Relevant Orders   Comprehensive metabolic panel with GFR   Lipid panel   Essential hypertension   Well controlled, no changes to meds. Encouraged heart healthy diet such as the DASH diet and exercise as tolerated.        Other Visit Diagnoses       Vitamin B12 deficiency       Relevant Orders   Vitamin B12     Assessment and Plan Assessment & Plan Depression   Her depression worsened due to recent bereavements, including the death of her mother's sister in March, who was like a second mother. She experienced significant emotional distress, daily crying, and reluctance to get out of bed. Support from her aunt's friends has led to improvement without needing increased medication. She acknowledges the potential for future exacerbations, especially with her uncle's health concerns. Continue current medication regimen for depression and anxiety. Encourage maintaining a strong support system.  Peripheral edema   Peripheral edema occurs on days with prolonged standing, particularly in hot weather. Diuretic medication effectively manages the swelling. Continue diuretic medication as prescribed. Advise monitoring swelling and reporting any significant changes.    No follow-ups on file.    Danilynn Jemison R Lowne Chase, DO

## 2023-08-22 NOTE — Assessment & Plan Note (Signed)
 Well controlled, no changes to meds. Encouraged heart healthy diet such as the DASH diet and exercise as tolerated.

## 2023-08-22 NOTE — Assessment & Plan Note (Signed)
 Encourage heart healthy diet such as MIND or DASH diet, increase exercise, avoid trans fats, simple carbohydrates and processed foods, consider a krill or fish or flaxseed oil cap daily.

## 2023-08-22 NOTE — Assessment & Plan Note (Signed)
Check labs  Con't synthroid 

## 2023-08-23 ENCOUNTER — Telehealth: Payer: Self-pay | Admitting: *Deleted

## 2023-08-23 LAB — CBC WITH DIFFERENTIAL/PLATELET
Basophils Absolute: 0.1 10*3/uL (ref 0.0–0.1)
Basophils Relative: 0.6 % (ref 0.0–3.0)
Eosinophils Absolute: 0.1 10*3/uL (ref 0.0–0.7)
Eosinophils Relative: 0.7 % (ref 0.0–5.0)
HCT: 39.2 % (ref 36.0–46.0)
Hemoglobin: 13 g/dL (ref 12.0–15.0)
Lymphocytes Relative: 14.8 % (ref 12.0–46.0)
Lymphs Abs: 2.7 10*3/uL (ref 0.7–4.0)
MCHC: 33.1 g/dL (ref 30.0–36.0)
MCV: 90.7 fl (ref 78.0–100.0)
Monocytes Absolute: 0.9 10*3/uL (ref 0.1–1.0)
Monocytes Relative: 5.1 % (ref 3.0–12.0)
Neutro Abs: 14.5 10*3/uL — ABNORMAL HIGH (ref 1.4–7.7)
Neutrophils Relative %: 78.8 % — ABNORMAL HIGH (ref 43.0–77.0)
Platelets: 298 10*3/uL (ref 150.0–400.0)
RBC: 4.33 Mil/uL (ref 3.87–5.11)
RDW: 12.7 % (ref 11.5–15.5)
WBC: 18.4 10*3/uL (ref 4.0–10.5)

## 2023-08-23 LAB — LIPID PANEL
Cholesterol: 219 mg/dL — ABNORMAL HIGH (ref 0–200)
HDL: 51.8 mg/dL (ref >39.00)
LDL Cholesterol: 111 mg/dL — ABNORMAL HIGH (ref 0–99)
NonHDL: 167.67
Total CHOL/HDL Ratio: 4
Triglycerides: 282 mg/dL — ABNORMAL HIGH (ref 0.0–149.0)
VLDL: 56.4 mg/dL — ABNORMAL HIGH (ref 0.0–40.0)

## 2023-08-23 LAB — COMPREHENSIVE METABOLIC PANEL WITH GFR
ALT: 7 U/L (ref 0–35)
AST: 13 U/L (ref 0–37)
Albumin: 4.8 g/dL (ref 3.5–5.2)
Alkaline Phosphatase: 100 U/L (ref 39–117)
BUN: 18 mg/dL (ref 6–23)
CO2: 26 meq/L (ref 19–32)
Calcium: 9.7 mg/dL (ref 8.4–10.5)
Chloride: 100 meq/L (ref 96–112)
Creatinine, Ser: 0.68 mg/dL (ref 0.40–1.20)
GFR: 98.05 mL/min (ref >60.00)
Glucose, Bld: 93 mg/dL (ref 70–99)
Potassium: 4.1 meq/L (ref 3.5–5.1)
Sodium: 137 meq/L (ref 135–145)
Total Bilirubin: 0.5 mg/dL (ref 0.2–1.2)
Total Protein: 7.7 g/dL (ref 6.0–8.3)

## 2023-08-23 LAB — TSH: TSH: 1.18 u[IU]/mL (ref 0.35–5.50)

## 2023-08-23 LAB — VITAMIN B12: Vitamin B-12: 609 pg/mL (ref 211–911)

## 2023-08-23 LAB — VITAMIN D 25 HYDROXY (VIT D DEFICIENCY, FRACTURES): VITD: 24.29 ng/mL — ABNORMAL LOW (ref 30.00–100.00)

## 2023-08-23 NOTE — Telephone Encounter (Signed)
 CRITICAL VALUE STICKER  CRITICAL VALUE: WBC 18.4  MESSENGER (representative from lab): Camilo Cella

## 2023-08-24 ENCOUNTER — Ambulatory Visit: Payer: Self-pay | Admitting: Family Medicine

## 2023-09-12 ENCOUNTER — Telehealth: Payer: Self-pay | Admitting: Family Medicine

## 2023-09-12 NOTE — Telephone Encounter (Signed)
 Copied from CRM [id number]. Topic: Medicare AWV >> Sep 12, 2023 11:38 AM Nathanel DEL wrote: Reason for CRM: LVM 09/12/2023 to schedule AWV. Please schedule Virtual or Telehealth visits ONLY.   Nathanel Paschal; Care Guide Ambulatory Clinical Support [facility] Direct Dial: [phone]

## 2023-09-21 DIAGNOSIS — L209 Atopic dermatitis, unspecified: Secondary | ICD-10-CM | POA: Diagnosis not present

## 2023-10-09 ENCOUNTER — Other Ambulatory Visit: Payer: Self-pay | Admitting: Family Medicine

## 2023-10-09 DIAGNOSIS — F418 Other specified anxiety disorders: Secondary | ICD-10-CM

## 2023-10-10 ENCOUNTER — Inpatient Hospital Stay (HOSPITAL_BASED_OUTPATIENT_CLINIC_OR_DEPARTMENT_OTHER): Admitting: Hematology & Oncology

## 2023-10-10 ENCOUNTER — Encounter: Payer: Self-pay | Admitting: Hematology & Oncology

## 2023-10-10 ENCOUNTER — Inpatient Hospital Stay: Attending: Hematology & Oncology

## 2023-10-10 VITALS — BP 121/45 | HR 56 | Temp 97.5°F | Resp 20 | Ht 66.0 in | Wt 231.0 lb

## 2023-10-10 DIAGNOSIS — E039 Hypothyroidism, unspecified: Secondary | ICD-10-CM | POA: Insufficient documentation

## 2023-10-10 DIAGNOSIS — D72829 Elevated white blood cell count, unspecified: Secondary | ICD-10-CM

## 2023-10-10 DIAGNOSIS — D471 Chronic myeloproliferative disease: Secondary | ICD-10-CM | POA: Insufficient documentation

## 2023-10-10 LAB — CBC WITH DIFFERENTIAL (CANCER CENTER ONLY)
Abs Immature Granulocytes: 0.23 K/uL — ABNORMAL HIGH (ref 0.00–0.07)
Basophils Absolute: 0.1 K/uL (ref 0.0–0.1)
Basophils Relative: 1 %
Eosinophils Absolute: 0.1 K/uL (ref 0.0–0.5)
Eosinophils Relative: 1 %
HCT: 41.1 % (ref 36.0–46.0)
Hemoglobin: 13.3 g/dL (ref 12.0–15.0)
Immature Granulocytes: 2 %
Lymphocytes Relative: 18 %
Lymphs Abs: 2.4 K/uL (ref 0.7–4.0)
MCH: 29.7 pg (ref 26.0–34.0)
MCHC: 32.4 g/dL (ref 30.0–36.0)
MCV: 91.7 fL (ref 80.0–100.0)
Monocytes Absolute: 0.7 K/uL (ref 0.1–1.0)
Monocytes Relative: 6 %
Neutro Abs: 9.6 K/uL — ABNORMAL HIGH (ref 1.7–7.7)
Neutrophils Relative %: 72 %
Platelet Count: 321 K/uL (ref 150–400)
RBC: 4.48 MIL/uL (ref 3.87–5.11)
RDW: 12.2 % (ref 11.5–15.5)
WBC Count: 13.2 K/uL — ABNORMAL HIGH (ref 4.0–10.5)
nRBC: 0 % (ref 0.0–0.2)

## 2023-10-10 LAB — CMP (CANCER CENTER ONLY)
ALT: 7 U/L (ref 0–44)
AST: 12 U/L — ABNORMAL LOW (ref 15–41)
Albumin: 4.9 g/dL (ref 3.5–5.0)
Alkaline Phosphatase: 74 U/L (ref 38–126)
Anion gap: 9 (ref 5–15)
BUN: 17 mg/dL (ref 6–20)
CO2: 30 mmol/L (ref 22–32)
Calcium: 10.1 mg/dL (ref 8.9–10.3)
Chloride: 102 mmol/L (ref 98–111)
Creatinine: 0.67 mg/dL (ref 0.44–1.00)
GFR, Estimated: >60 mL/min (ref >60)
Glucose, Bld: 106 mg/dL — ABNORMAL HIGH (ref 70–99)
Potassium: 4.6 mmol/L (ref 3.5–5.1)
Sodium: 141 mmol/L (ref 135–145)
Total Bilirubin: 0.7 mg/dL (ref 0.0–1.2)
Total Protein: 7.7 g/dL (ref 6.5–8.1)

## 2023-10-10 LAB — LACTATE DEHYDROGENASE: LDH: 159 U/L (ref 98–192)

## 2023-10-10 LAB — SAVE SMEAR(SSMR), FOR PROVIDER SLIDE REVIEW

## 2023-10-10 NOTE — Progress Notes (Signed)
 Hematology and Oncology Follow Up Visit  Brianna Fletcher [id number] 1970-06-10 53 y.o. 10/10/2023   Principle Diagnosis:  Chronic myeloproliferative syndrome-Triple negative Sub-clinical hypothyroidism (+) p-ANCA   Current Therapy:        Peg-Interferon q weekly - q 4 week dosing - On hold due to cost Synthroid  0.050 mg po q day   Interim History:  Brianna Fletcher is here today for follow-up.  She is still having a very difficult time emotionally.  Her father, now, is declining because of dementia and other chronic health issues.  She went through the same thing with her mother, who passed away about a year ago.  She has had several people close her who have passed away recently.  Because of taking care of her dad, she is not able to see her grand children.  Thankfully, I think they will be going to visit her sometime this summer for a little bit.  She has had no problems with nausea or vomiting.  She is trying to watch what she eats and lose a little bit of weight..  She has had no cough.  She is worried about a lump under the left arm.  I took a look.  This appears to be just adipose tissue.  I do not think this would be a problem with her when she has her annual mammogram.  She has had no bleeding.  There has been no fever.  She has had no rashes.  Overall, I would have to say that her performance status is probably ECOG 0.    Medications:  Allergies as of 10/10/2023       Reactions   Lisinopril  Cough   Losartan  Cough   Lubiprostone  Nausea And Vomiting   Pravachol  [pravastatin  Sodium] Other (See Comments)   Causes pain in my body   Repatha  [evolocumab ] Cough   Severe cough that would not respond to any treatment - per patient.        Medication List        Accurate as of October 10, 2023 11:21 AM. If you have any questions, ask your nurse or doctor.          amLODipine  10 MG tablet Commonly known as: NORVASC  TAKE 1 TABLET BY MOUTH EVERY DAY   aspirin  81 MG  tablet Take 81 mg by mouth daily.   chlorhexidine  0.12 % solution Commonly known as: PERIDEX Use as directed 5 mLs in the mouth or throat daily.   escitalopram  10 MG tablet Commonly known as: LEXAPRO  TAKE 1 TABLET BY MOUTH EVERYDAY AT BEDTIME   ezetimibe  10 MG tablet Commonly known as: ZETIA  Take 1 tablet (10 mg total) by mouth daily.   furosemide  20 MG tablet Commonly known as: LASIX  TAKE 1 TABLET BY MOUTH EVERY DAY   levothyroxine  50 MCG tablet Commonly known as: SYNTHROID  TAKE 1 TABLET BY MOUTH DAILY BEFORE BREAKFAST   MAGNESIUM PO Take 400 mg by mouth daily.   mupirocin ointment 2 % Commonly known as: BACTROBAN Apply 1 Application topically 3 (three) times daily.   Nexletol  180 MG Tabs Generic drug: Bempedoic Acid  TAKE 1 TABLET BY MOUTH EVERY DAY   omeprazole  20 MG capsule Commonly known as: PRILOSEC TAKE 1 CAPSULE BY MOUTH EVERY DAY   Vitamin D  (Ergocalciferol ) 1.25 MG (50000 UNIT) Caps capsule Commonly known as: DRISDOL  Take 50,000 Units by mouth every 7 (seven) days.   Zinc Gluconate 30 MG Tabs Take 30 mg by mouth daily.  Allergies:  Allergies  Allergen Reactions   Lisinopril  Cough   Losartan  Cough   Lubiprostone  Nausea And Vomiting   Pravachol  [Pravastatin  Sodium] Other (See Comments)    Causes pain in my body   Repatha  [Evolocumab ] Cough    Severe cough that would not respond to any treatment - per patient.    Past Medical History, Surgical history, Social history, and Family History were reviewed and updated.  Review of Systems: Review of Systems  Constitutional: Negative.   HENT: Negative.    Eyes: Negative.   Respiratory: Negative.    Cardiovascular: Negative.   Gastrointestinal: Negative.   Genitourinary: Negative.   Musculoskeletal: Negative.   Skin: Negative.   Neurological: Negative.   Endo/Heme/Allergies: Negative.   Psychiatric/Behavioral: Negative.     Brianna Fletcher   Physical Exam:  height is 5' 6 (1.676 m) and weight  is 231 lb (104.8 kg). Her oral temperature is 97.5 F (36.4 C) (abnormal). Her blood pressure is 121/45 (abnormal) and her pulse is 56 (abnormal). Her respiration is 20 and oxygen saturation is 98%.   Wt Readings from Last 3 Encounters:  10/10/23 231 lb (104.8 kg)  08/22/23 232 lb (105.2 kg)  06/13/23 228 lb (103.4 kg)    Physical Exam Vitals reviewed.  HENT:     Head: Normocephalic and atraumatic.  Eyes:     Pupils: Pupils are equal, round, and reactive to light.  Cardiovascular:     Rate and Rhythm: Normal rate and regular rhythm.     Heart sounds: Normal heart sounds.  Pulmonary:     Effort: Pulmonary effort is normal.     Breath sounds: Normal breath sounds.  Abdominal:     General: Bowel sounds are normal.     Palpations: Abdomen is soft.  Musculoskeletal:        General: No tenderness or deformity. Normal range of motion.     Cervical back: Normal range of motion.  Lymphadenopathy:     Cervical: No cervical adenopathy.  Skin:    General: Skin is warm and dry.     Findings: No erythema or rash.  Neurological:     Mental Status: She is alert and oriented to person, place, and time.  Psychiatric:        Behavior: Behavior normal.        Thought Content: Thought content normal.        Judgment: Judgment normal.      Lab Results  Component Value Date   WBC 13.2 (H) 10/10/2023   HGB 13.3 10/10/2023   HCT 41.1 10/10/2023   MCV 91.7 10/10/2023   PLT 321 10/10/2023   Lab Results  Component Value Date   FERRITIN 60 08/03/2021   IRON 89 08/03/2021   TIBC 350 08/03/2021   UIBC 261 08/03/2021   IRONPCTSAT 25 08/03/2021   Lab Results  Component Value Date   RETICCTPCT 2.1 04/12/2017   RBC 4.48 10/10/2023   RETICCTABS 116.1 11/20/2013   No results found for: KPAFRELGTCHN, LAMBDASER, KAPLAMBRATIO No results found for: IGGSERUM, IGA, IGMSERUM No results found for: STEPHANY CARLOTA BENSON MARKEL EARLA JOANNIE DOC VICK,  SPEI   Chemistry      Component Value Date/Time   NA 141 10/10/2023 0955   NA 138 10/08/2021 1415   NA 140 02/08/2017 0925   NA 136 03/10/2016 1147   K 4.6 10/10/2023 0955   K 3.4 02/08/2017 0925   K 3.9 03/10/2016 1147   CL 102 10/10/2023 0955   CL 105 02/08/2017  0925   CO2 30 10/10/2023 0955   CO2 26 02/08/2017 0925   CO2 24 03/10/2016 1147   BUN 17 10/10/2023 0955   BUN 9 10/08/2021 1415   BUN 10 02/08/2017 0925   BUN 10.2 03/10/2016 1147   CREATININE 0.67 10/10/2023 0955   CREATININE 0.7 02/08/2017 0925   CREATININE 0.7 03/10/2016 1147      Component Value Date/Time   CALCIUM 10.1 10/10/2023 0955   CALCIUM 10.4 (H) 02/08/2017 0925   CALCIUM 10.7 (H) 03/10/2016 1147   ALKPHOS 74 10/10/2023 0955   ALKPHOS 106 (H) 02/08/2017 0925   ALKPHOS 132 03/10/2016 1147   AST 12 (L) 10/10/2023 0955   AST 12 03/10/2016 1147   ALT 7 10/10/2023 0955   ALT 16 02/08/2017 0925   ALT 8 03/10/2016 1147   BILITOT 0.7 10/10/2023 0955   BILITOT 0.63 03/10/2016 1147       Impression and Plan: Ms. Krawczyk is a very pleasant 53 yo caucasian female with a triple negative myeloproliferative syndrome though all work up has been negative so far.   I am glad that her blood work looks better.  Under the microscope, I do not see anything with the white blood cells that looks suspicious.  There is no nucleated red blood cells.  I see no teardrop cells.  There are no hypersegmented polys.  Platelets look fine.  I really hope that her grandchildren will be able to visit her.  I know this will make her feel a whole lot better since she cannot see them because of her dad.  We will plan to get her back to see us  sometime in the Autumn.  I think this would be reasonable.    Maude JONELLE Crease, MD 7/21/202511:21 AM

## 2023-10-26 NOTE — Progress Notes (Signed)
 [facility] Quality Team Note  Name: Brianna Fletcher Date of Birth: 1970/09/24 MRN: [id number] Date: 10/26/2023  [facility] Quality Team has reviewed this patient's chart, please see recommendations below:  [facility] Quality Other; (Chart reviewed for CBP and BCS. ABSTRACTED MOST RECENT CBP, MAMMOGRAM ORDERED BUT NOT COMPLETED.)

## 2023-11-16 ENCOUNTER — Other Ambulatory Visit: Payer: Self-pay | Admitting: Family Medicine

## 2023-11-16 DIAGNOSIS — D471 Chronic myeloproliferative disease: Secondary | ICD-10-CM

## 2023-11-16 DIAGNOSIS — E041 Nontoxic single thyroid nodule: Secondary | ICD-10-CM

## 2023-11-16 DIAGNOSIS — E039 Hypothyroidism, unspecified: Secondary | ICD-10-CM

## 2023-12-08 ENCOUNTER — Other Ambulatory Visit: Payer: Self-pay | Admitting: Family Medicine

## 2023-12-08 ENCOUNTER — Other Ambulatory Visit: Payer: Self-pay | Admitting: Cardiology

## 2023-12-08 DIAGNOSIS — K219 Gastro-esophageal reflux disease without esophagitis: Secondary | ICD-10-CM

## 2024-01-10 ENCOUNTER — Ambulatory Visit: Admitting: Hematology & Oncology

## 2024-01-10 ENCOUNTER — Inpatient Hospital Stay

## 2024-01-11 ENCOUNTER — Other Ambulatory Visit: Payer: Self-pay | Admitting: Family Medicine

## 2024-01-11 DIAGNOSIS — R600 Localized edema: Secondary | ICD-10-CM

## 2024-01-18 DIAGNOSIS — E559 Vitamin D deficiency, unspecified: Secondary | ICD-10-CM | POA: Diagnosis not present

## 2024-01-18 DIAGNOSIS — E213 Hyperparathyroidism, unspecified: Secondary | ICD-10-CM | POA: Diagnosis not present

## 2024-01-19 ENCOUNTER — Inpatient Hospital Stay: Admitting: Hematology & Oncology

## 2024-01-19 ENCOUNTER — Inpatient Hospital Stay: Attending: Hematology & Oncology

## 2024-01-19 VITALS — BP 123/65 | HR 60 | Temp 97.9°F | Resp 16 | Wt 240.0 lb

## 2024-01-19 DIAGNOSIS — D72829 Elevated white blood cell count, unspecified: Secondary | ICD-10-CM

## 2024-01-19 DIAGNOSIS — D72828 Other elevated white blood cell count: Secondary | ICD-10-CM

## 2024-01-19 DIAGNOSIS — D471 Chronic myeloproliferative disease: Secondary | ICD-10-CM | POA: Diagnosis present

## 2024-01-19 LAB — CMP (CANCER CENTER ONLY)
ALT: 13 U/L (ref 0–44)
AST: 25 U/L (ref 15–41)
Albumin: 4.5 g/dL (ref 3.5–5.0)
Alkaline Phosphatase: 90 U/L (ref 38–126)
Anion gap: 12 (ref 5–15)
BUN: 17 mg/dL (ref 6–20)
CO2: 27 mmol/L (ref 22–32)
Calcium: 9.6 mg/dL (ref 8.9–10.3)
Chloride: 100 mmol/L (ref 98–111)
Creatinine: 0.58 mg/dL (ref 0.44–1.00)
GFR, Estimated: >60 mL/min (ref >60)
Glucose, Bld: 102 mg/dL — ABNORMAL HIGH (ref 70–99)
Potassium: 4.2 mmol/L (ref 3.5–5.1)
Sodium: 139 mmol/L (ref 135–145)
Total Bilirubin: 0.6 mg/dL (ref 0.0–1.2)
Total Protein: 7.5 g/dL (ref 6.5–8.1)

## 2024-01-19 LAB — CBC WITH DIFFERENTIAL (CANCER CENTER ONLY)
Abs Immature Granulocytes: 0.15 K/uL — ABNORMAL HIGH (ref 0.00–0.07)
Basophils Absolute: 0.1 K/uL (ref 0.0–0.1)
Basophils Relative: 1 %
Eosinophils Absolute: 0.1 K/uL (ref 0.0–0.5)
Eosinophils Relative: 1 %
HCT: 39.2 % (ref 36.0–46.0)
Hemoglobin: 12.8 g/dL (ref 12.0–15.0)
Immature Granulocytes: 1 %
Lymphocytes Relative: 19 %
Lymphs Abs: 2.4 K/uL (ref 0.7–4.0)
MCH: 30.3 pg (ref 26.0–34.0)
MCHC: 32.7 g/dL (ref 30.0–36.0)
MCV: 92.9 fL (ref 80.0–100.0)
Monocytes Absolute: 0.7 K/uL (ref 0.1–1.0)
Monocytes Relative: 6 %
Neutro Abs: 9 K/uL — ABNORMAL HIGH (ref 1.7–7.7)
Neutrophils Relative %: 72 %
Platelet Count: 335 K/uL (ref 150–400)
RBC: 4.22 MIL/uL (ref 3.87–5.11)
RDW: 12.3 % (ref 11.5–15.5)
WBC Count: 12.4 K/uL — ABNORMAL HIGH (ref 4.0–10.5)
nRBC: 0 % (ref 0.0–0.2)

## 2024-01-19 LAB — SAVE SMEAR(SSMR), FOR PROVIDER SLIDE REVIEW

## 2024-01-19 LAB — LACTATE DEHYDROGENASE: LDH: 175 U/L (ref 98–192)

## 2024-01-19 NOTE — Progress Notes (Signed)
 Hematology and Oncology Follow Up Visit  Brianna Fletcher [id number] 23-Mar-1970 53 y.o. 01/19/2024   Principle Diagnosis:  Chronic myeloproliferative syndrome-Triple negative Sub-clinical hypothyroidism (+) p-ANCA   Current Therapy:        Peg-Interferon q weekly - q 4 week dosing - On hold due to cost Synthroid  0.050 mg po q day   Interim History:  Brianna Fletcher is here today for follow-up.  She is doing her much better since we last saw her.  She is still dealing with her father who is declining because of dementia.  She is helping him as much as she can.  She is going to be a grandmother again.  Her youngest son is going to have another child.  He lives down in [place].  Otherwise, she is managing pretty well.  She does have a low vitamin D  level.  She is being followed by Dr. Claudene for this.  He was put on vitamin K.  I do not see a problem with her being on vitamin K.  She has had no problems with bowels or bladder.  She has had no cough or shortness of breath.  She has had no issues with COVID.  Overall, I will say that her performance status is probably ECOG 0.     Medications:  Allergies as of 01/19/2024       Reactions   Lisinopril  Cough   Losartan  Cough   Lubiprostone  Nausea And Vomiting   Pravachol  [pravastatin  Sodium] Other (See Comments)   Causes pain in my body   Repatha  [evolocumab ] Cough   Severe cough that would not respond to any treatment - per patient.        Medication List        Accurate as of January 19, 2024 10:26 AM. If you have any questions, ask your nurse or doctor.          amLODipine  10 MG tablet Commonly known as: NORVASC  TAKE 1 TABLET BY MOUTH EVERY DAY   aspirin  81 MG tablet Take 81 mg by mouth daily.   chlorhexidine  0.12 % solution Commonly known as: PERIDEX Use as directed 5 mLs in the mouth or throat daily.   escitalopram  10 MG tablet Commonly known as: LEXAPRO  TAKE 1 TABLET BY MOUTH EVERYDAY AT BEDTIME    ezetimibe  10 MG tablet Commonly known as: ZETIA  Take 1 tablet (10 mg total) by mouth daily.   furosemide  20 MG tablet Commonly known as: LASIX  Take 1 tablet (20 mg total) by mouth daily.   levothyroxine  50 MCG tablet Commonly known as: SYNTHROID  Take 1 tablet (50 mcg total) by mouth daily before breakfast.   MAGNESIUM PO Take 400 mg by mouth daily.   mupirocin ointment 2 % Commonly known as: BACTROBAN Apply 1 Application topically 3 (three) times daily.   Nexletol  180 MG Tabs Generic drug: Bempedoic Acid  Take 1 tablet (180 mg total) by mouth daily. Please schedule follow up visit.   omeprazole  20 MG capsule Commonly known as: PRILOSEC Take 1 capsule (20 mg total) by mouth daily.   Vitamin D  (Ergocalciferol ) 1.25 MG (50000 UNIT) Caps capsule Commonly known as: DRISDOL  Take 50,000 Units by mouth every 7 (seven) days.   Zinc Gluconate 30 MG Tabs Take 30 mg by mouth daily.        Allergies:  Allergies  Allergen Reactions   Lisinopril  Cough   Losartan  Cough   Lubiprostone  Nausea And Vomiting   Pravachol  [Pravastatin  Sodium] Other (See Comments)    Causes  pain in my body   Repatha  [Evolocumab ] Cough    Severe cough that would not respond to any treatment - per patient.    Past Medical History, Surgical history, Social history, and Family History were reviewed and updated.  Review of Systems: Review of Systems  Constitutional: Negative.   HENT: Negative.    Eyes: Negative.   Respiratory: Negative.    Cardiovascular: Negative.   Gastrointestinal: Negative.   Genitourinary: Negative.   Musculoskeletal: Negative.   Skin: Negative.   Neurological: Negative.   Endo/Heme/Allergies: Negative.   Psychiatric/Behavioral: Negative.     Brianna Fletcher   Physical Exam:  weight is 240 lb (108.9 kg). Her oral temperature is 97.9 F (36.6 C). Her blood pressure is 123/65 and her pulse is 60. Her respiration is 16 and oxygen saturation is 100%.   Wt Readings from Last 3  Encounters:  01/19/24 240 lb (108.9 kg)  10/10/23 231 lb (104.8 kg)  08/22/23 232 lb (105.2 kg)    Physical Exam Vitals reviewed.  HENT:     Head: Normocephalic and atraumatic.  Eyes:     Pupils: Pupils are equal, round, and reactive to light.  Cardiovascular:     Rate and Rhythm: Normal rate and regular rhythm.     Heart sounds: Normal heart sounds.  Pulmonary:     Effort: Pulmonary effort is normal.     Breath sounds: Normal breath sounds.  Abdominal:     General: Bowel sounds are normal.     Palpations: Abdomen is soft.  Musculoskeletal:        General: No tenderness or deformity. Normal range of motion.     Cervical back: Normal range of motion.  Lymphadenopathy:     Cervical: No cervical adenopathy.  Skin:    General: Skin is warm and dry.     Findings: No erythema or rash.  Neurological:     Mental Status: She is alert and oriented to person, place, and time.  Psychiatric:        Behavior: Behavior normal.        Thought Content: Thought content normal.        Judgment: Judgment normal.      Lab Results  Component Value Date   WBC 12.4 (H) 01/19/2024   HGB 12.8 01/19/2024   HCT 39.2 01/19/2024   MCV 92.9 01/19/2024   PLT 335 01/19/2024   Lab Results  Component Value Date   FERRITIN 60 08/03/2021   IRON 89 08/03/2021   TIBC 350 08/03/2021   UIBC 261 08/03/2021   IRONPCTSAT 25 08/03/2021   Lab Results  Component Value Date   RETICCTPCT 2.1 04/12/2017   RBC 4.22 01/19/2024   RETICCTABS 116.1 11/20/2013   No results found for: KPAFRELGTCHN, LAMBDASER, KAPLAMBRATIO No results found for: IGGSERUM, IGA, IGMSERUM No results found for: STEPHANY CARLOTA BENSON MARKEL EARLA JOANNIE DOC VICK, SPEI   Chemistry      Component Value Date/Time   NA 139 01/19/2024 0923   NA 138 10/08/2021 1415   NA 140 02/08/2017 0925   NA 136 03/10/2016 1147   K 4.2 01/19/2024 0923   K 3.4 02/08/2017 0925   K 3.9 03/10/2016 1147    CL 100 01/19/2024 0923   CL 105 02/08/2017 0925   CO2 27 01/19/2024 0923   CO2 26 02/08/2017 0925   CO2 24 03/10/2016 1147   BUN 17 01/19/2024 0923   BUN 9 10/08/2021 1415   BUN 10 02/08/2017 0925   BUN 10.2 03/10/2016  1147   CREATININE 0.58 01/19/2024 0923   CREATININE 0.7 02/08/2017 0925   CREATININE 0.7 03/10/2016 1147      Component Value Date/Time   CALCIUM 9.6 01/19/2024 0923   CALCIUM 10.4 (H) 02/08/2017 0925   CALCIUM 10.7 (H) 03/10/2016 1147   ALKPHOS 90 01/19/2024 0923   ALKPHOS 106 (H) 02/08/2017 0925   ALKPHOS 132 03/10/2016 1147   AST 25 01/19/2024 0923   AST 12 03/10/2016 1147   ALT 13 01/19/2024 0923   ALT 16 02/08/2017 0925   ALT 8 03/10/2016 1147   BILITOT 0.6 01/19/2024 0923   BILITOT 0.63 03/10/2016 1147       Impression and Plan: Brianna Fletcher is a very pleasant 53 yo caucasian female with a triple negative myeloproliferative syndrome though all work up has been negative so far.   I know that she is doing a great job taking care of her father.  I know that she is sacrificing a lot to help him.  She is happy doing this.  She would just wants him to have some quality of life since there is no one else who can to help take care of him.  She knows that at some point, she will be able to visit the grandchildren.  We will plan to get her back after the Holiday season.  We will get her back in late winter.   Maude JONELLE Crease, MD 10/30/202510:26 AM

## 2024-02-14 ENCOUNTER — Telehealth: Payer: Self-pay | Admitting: Family Medicine

## 2024-02-14 NOTE — Telephone Encounter (Signed)
 Copied from CRM #[id number]. Topic: Medicare AWV >> Feb 14, 2024  1:13 PM Nathanel DEL wrote: Called LVM 02/14/2024 to sched AWV. Please schedule in office or virtual visit.   Nathanel Paschal; Care Guide Ambulatory Clinical Support [place] [facility] Direct Dial: [phone]

## 2024-03-14 ENCOUNTER — Other Ambulatory Visit: Payer: Self-pay | Admitting: Family Medicine

## 2024-03-14 ENCOUNTER — Other Ambulatory Visit: Payer: Self-pay | Admitting: Cardiology

## 2024-03-14 DIAGNOSIS — K219 Gastro-esophageal reflux disease without esophagitis: Secondary | ICD-10-CM

## 2024-03-14 DIAGNOSIS — I10 Essential (primary) hypertension: Secondary | ICD-10-CM

## 2024-04-05 ENCOUNTER — Other Ambulatory Visit: Payer: Self-pay | Admitting: Family Medicine

## 2024-04-05 DIAGNOSIS — F418 Other specified anxiety disorders: Secondary | ICD-10-CM

## 2024-04-08 ENCOUNTER — Other Ambulatory Visit: Payer: Self-pay | Admitting: Cardiology

## 2024-04-27 ENCOUNTER — Other Ambulatory Visit: Payer: Self-pay | Admitting: Cardiology

## 2024-05-24 ENCOUNTER — Inpatient Hospital Stay

## 2024-05-24 ENCOUNTER — Inpatient Hospital Stay: Admitting: Hematology & Oncology
